# Patient Record
Sex: Male | Born: 1950 | Race: White | Hispanic: No | Marital: Married | Smoking: Former smoker
Health system: Southern US, Community
[De-identification: ages and names within clinical notes are randomized; demographics above are authoritative.]

## PROBLEM LIST (undated history)

## (undated) DIAGNOSIS — M519 Unspecified thoracic, thoracolumbar and lumbosacral intervertebral disc disorder: Secondary | ICD-10-CM

## (undated) DIAGNOSIS — J449 Chronic obstructive pulmonary disease, unspecified: Secondary | ICD-10-CM

## (undated) DIAGNOSIS — T8859XA Other complications of anesthesia, initial encounter: Secondary | ICD-10-CM

## (undated) DIAGNOSIS — IMO0001 Reserved for inherently not codable concepts without codable children: Secondary | ICD-10-CM

## (undated) DIAGNOSIS — I2510 Atherosclerotic heart disease of native coronary artery without angina pectoris: Secondary | ICD-10-CM

## (undated) DIAGNOSIS — I10 Essential (primary) hypertension: Secondary | ICD-10-CM

## (undated) DIAGNOSIS — Z87442 Personal history of urinary calculi: Secondary | ICD-10-CM

## (undated) DIAGNOSIS — N200 Calculus of kidney: Secondary | ICD-10-CM

## (undated) DIAGNOSIS — E7800 Pure hypercholesterolemia, unspecified: Secondary | ICD-10-CM

## (undated) DIAGNOSIS — I82409 Acute embolism and thrombosis of unspecified deep veins of unspecified lower extremity: Secondary | ICD-10-CM

## (undated) DIAGNOSIS — R51 Headache: Secondary | ICD-10-CM

## (undated) DIAGNOSIS — G2581 Restless legs syndrome: Secondary | ICD-10-CM

## (undated) DIAGNOSIS — G4730 Sleep apnea, unspecified: Secondary | ICD-10-CM

## (undated) DIAGNOSIS — I499 Cardiac arrhythmia, unspecified: Secondary | ICD-10-CM

## (undated) DIAGNOSIS — K219 Gastro-esophageal reflux disease without esophagitis: Secondary | ICD-10-CM

## (undated) DIAGNOSIS — R519 Headache, unspecified: Secondary | ICD-10-CM

## (undated) DIAGNOSIS — H9319 Tinnitus, unspecified ear: Secondary | ICD-10-CM

## (undated) DIAGNOSIS — M1990 Unspecified osteoarthritis, unspecified site: Secondary | ICD-10-CM

## (undated) DIAGNOSIS — M5430 Sciatica, unspecified side: Secondary | ICD-10-CM

## (undated) DIAGNOSIS — H9190 Unspecified hearing loss, unspecified ear: Secondary | ICD-10-CM

## (undated) DIAGNOSIS — E119 Type 2 diabetes mellitus without complications: Secondary | ICD-10-CM

## (undated) DIAGNOSIS — M205X9 Other deformities of toe(s) (acquired), unspecified foot: Secondary | ICD-10-CM

## (undated) DIAGNOSIS — T4145XA Adverse effect of unspecified anesthetic, initial encounter: Secondary | ICD-10-CM

## (undated) DIAGNOSIS — J8410 Pulmonary fibrosis, unspecified: Secondary | ICD-10-CM

## (undated) DIAGNOSIS — Z8719 Personal history of other diseases of the digestive system: Secondary | ICD-10-CM

## (undated) DIAGNOSIS — I509 Heart failure, unspecified: Secondary | ICD-10-CM

## (undated) DIAGNOSIS — C801 Malignant (primary) neoplasm, unspecified: Secondary | ICD-10-CM

## (undated) HISTORY — PX: BACK SURGERY: SHX140

## (undated) HISTORY — PX: ABDOMINAL SURGERY: SHX537

## (undated) HISTORY — PX: BLADDER REPAIR: SHX76

## (undated) HISTORY — PX: EYE SURGERY: SHX253

## (undated) HISTORY — PX: HERNIA REPAIR: SHX51

---

## 2014-01-08 DIAGNOSIS — G2581 Restless legs syndrome: Secondary | ICD-10-CM

## 2014-01-08 DIAGNOSIS — N200 Calculus of kidney: Secondary | ICD-10-CM

## 2014-01-08 HISTORY — DX: Calculus of kidney: N20.0

## 2014-01-08 HISTORY — DX: Restless legs syndrome: G25.81

## 2015-01-08 ENCOUNTER — Emergency Department (HOSPITAL_COMMUNITY): Payer: Medicare Other

## 2015-01-08 ENCOUNTER — Observation Stay (HOSPITAL_COMMUNITY)
Admission: EM | Admit: 2015-01-08 | Discharge: 2015-01-10 | Disposition: A | Discharge status: Home / Self Care | Payer: Medicare Other | Attending: Internal Medicine | Admitting: Internal Medicine

## 2015-01-08 ENCOUNTER — Encounter (HOSPITAL_COMMUNITY): Payer: Self-pay

## 2015-01-08 DIAGNOSIS — Z794 Long term (current) use of insulin: Secondary | ICD-10-CM | POA: Insufficient documentation

## 2015-01-08 DIAGNOSIS — E785 Hyperlipidemia, unspecified: Secondary | ICD-10-CM | POA: Insufficient documentation

## 2015-01-08 DIAGNOSIS — R079 Chest pain, unspecified: Secondary | ICD-10-CM | POA: Diagnosis not present

## 2015-01-08 DIAGNOSIS — K219 Gastro-esophageal reflux disease without esophagitis: Secondary | ICD-10-CM | POA: Diagnosis not present

## 2015-01-08 DIAGNOSIS — J449 Chronic obstructive pulmonary disease, unspecified: Secondary | ICD-10-CM | POA: Diagnosis present

## 2015-01-08 DIAGNOSIS — E1169 Type 2 diabetes mellitus with other specified complication: Secondary | ICD-10-CM | POA: Diagnosis present

## 2015-01-08 DIAGNOSIS — IMO0001 Reserved for inherently not codable concepts without codable children: Secondary | ICD-10-CM

## 2015-01-08 DIAGNOSIS — Z8551 Personal history of malignant neoplasm of bladder: Secondary | ICD-10-CM | POA: Insufficient documentation

## 2015-01-08 DIAGNOSIS — I5022 Chronic systolic (congestive) heart failure: Secondary | ICD-10-CM | POA: Diagnosis not present

## 2015-01-08 DIAGNOSIS — R072 Precordial pain: Secondary | ICD-10-CM | POA: Diagnosis not present

## 2015-01-08 DIAGNOSIS — E119 Type 2 diabetes mellitus without complications: Secondary | ICD-10-CM | POA: Insufficient documentation

## 2015-01-08 DIAGNOSIS — Z86718 Personal history of other venous thrombosis and embolism: Secondary | ICD-10-CM | POA: Insufficient documentation

## 2015-01-08 DIAGNOSIS — I2510 Atherosclerotic heart disease of native coronary artery without angina pectoris: Secondary | ICD-10-CM | POA: Diagnosis not present

## 2015-01-08 DIAGNOSIS — Z9981 Dependence on supplemental oxygen: Secondary | ICD-10-CM | POA: Insufficient documentation

## 2015-01-08 DIAGNOSIS — Z87891 Personal history of nicotine dependence: Secondary | ICD-10-CM | POA: Diagnosis not present

## 2015-01-08 DIAGNOSIS — I10 Essential (primary) hypertension: Secondary | ICD-10-CM | POA: Insufficient documentation

## 2015-01-08 HISTORY — DX: Calculus of kidney: N20.0

## 2015-01-08 HISTORY — DX: Chronic obstructive pulmonary disease, unspecified: J44.9

## 2015-01-08 HISTORY — DX: Type 2 diabetes mellitus without complications: E11.9

## 2015-01-08 HISTORY — DX: Malignant (primary) neoplasm, unspecified: C80.1

## 2015-01-08 HISTORY — DX: Essential (primary) hypertension: I10

## 2015-01-08 HISTORY — DX: Unspecified osteoarthritis, unspecified site: M19.90

## 2015-01-08 LAB — GLUCOSE, CAPILLARY: GLUCOSE-CAPILLARY: 83 mg/dL (ref 70–99)

## 2015-01-08 LAB — BASIC METABOLIC PANEL
Anion gap: 12 (ref 5–15)
BUN: 20 mg/dL (ref 6–23)
CHLORIDE: 104 mmol/L (ref 96–112)
CO2: 25 mmol/L (ref 19–32)
CREATININE: 0.86 mg/dL (ref 0.50–1.35)
Calcium: 9.1 mg/dL (ref 8.4–10.5)
Glucose, Bld: 141 mg/dL — ABNORMAL HIGH (ref 70–99)
Potassium: 4.1 mmol/L (ref 3.5–5.1)
Sodium: 141 mmol/L (ref 135–145)

## 2015-01-08 LAB — BRAIN NATRIURETIC PEPTIDE: B Natriuretic Peptide: 13.9 pg/mL (ref 0.0–100.0)

## 2015-01-08 LAB — CBC
HCT: 41.8 % (ref 39.0–52.0)
Hemoglobin: 13 g/dL (ref 13.0–17.0)
MCH: 26.3 pg (ref 26.0–34.0)
MCHC: 31.1 g/dL (ref 30.0–36.0)
MCV: 84.4 fL (ref 78.0–100.0)
PLATELETS: 209 10*3/uL (ref 150–400)
RBC: 4.95 MIL/uL (ref 4.22–5.81)
RDW: 17.5 % — ABNORMAL HIGH (ref 11.5–15.5)
WBC: 5 10*3/uL (ref 4.0–10.5)

## 2015-01-08 LAB — TROPONIN I
Troponin I: <0.03 ng/mL (ref <0.031)
Troponin I: <0.03 ng/mL (ref <0.031)

## 2015-01-08 MED ORDER — INSULIN ASPART 100 UNIT/ML ~~LOC~~ SOLN
0.0000 [IU] | Freq: Three times a day (TID) | SUBCUTANEOUS | Status: DC
Start: 2015-01-09 — End: 2015-01-10
  Administered 2015-01-09 – 2015-01-10 (×3): 3 [IU] via SUBCUTANEOUS

## 2015-01-08 MED ORDER — CARVEDILOL 3.125 MG PO TABS
3.1250 mg | ORAL_TABLET | Freq: Two times a day (BID) | ORAL | Status: DC
  Administered 2015-01-10: 3.125 mg via ORAL
  Filled 2015-01-08: qty 1

## 2015-01-08 MED ORDER — ACETAMINOPHEN 325 MG PO TABS: 650.0000 mg | ORAL_TABLET | ORAL | Status: DC | PRN

## 2015-01-08 MED ORDER — IOHEXOL 350 MG/ML SOLN
100.0000 mL | Freq: Once | INTRAVENOUS | Status: AC | PRN
  Administered 2015-01-08: 100 mL via INTRAVENOUS

## 2015-01-08 MED ORDER — GI COCKTAIL ~~LOC~~: 30.0000 mL | Freq: Four times a day (QID) | ORAL | Status: DC | PRN

## 2015-01-08 MED ORDER — ROPINIROLE HCL 1 MG PO TABS
0.5000 mg | ORAL_TABLET | Freq: Every day | ORAL | Status: DC
  Administered 2015-01-08 – 2015-01-09 (×2): 0.5 mg via ORAL
  Filled 2015-01-08 (×2): qty 0.5

## 2015-01-08 MED ORDER — INSULIN ASPART 100 UNIT/ML ~~LOC~~ SOLN
30.0000 [IU] | Freq: Three times a day (TID) | SUBCUTANEOUS | Status: DC
  Administered 2015-01-09 – 2015-01-10 (×3): 30 [IU] via SUBCUTANEOUS

## 2015-01-08 MED ORDER — ASPIRIN 81 MG PO CHEW
324.0000 mg | CHEWABLE_TABLET | Freq: Once | ORAL | Status: AC
  Administered 2015-01-08: 324 mg via ORAL
  Filled 2015-01-08: qty 4

## 2015-01-08 MED ORDER — NITROGLYCERIN 0.4 MG SL SUBL: 0.4000 mg | SUBLINGUAL_TABLET | SUBLINGUAL | Status: DC | PRN

## 2015-01-08 MED ORDER — LISINOPRIL 20 MG PO TABS
20.0000 mg | ORAL_TABLET | Freq: Every day | ORAL | Status: DC
  Administered 2015-01-09 – 2015-01-10 (×2): 20 mg via ORAL
  Filled 2015-01-08 (×2): qty 1

## 2015-01-08 MED ORDER — ASPIRIN EC 325 MG PO TBEC
325.0000 mg | DELAYED_RELEASE_TABLET | Freq: Every day | ORAL | Status: DC
  Administered 2015-01-09: 325 mg via ORAL
  Filled 2015-01-08: qty 1

## 2015-01-08 MED ORDER — ONDANSETRON HCL 4 MG/2ML IJ SOLN: 4.0000 mg | Freq: Four times a day (QID) | INTRAMUSCULAR | Status: DC | PRN

## 2015-01-08 MED ORDER — SIMVASTATIN 10 MG PO TABS
10.0000 mg | ORAL_TABLET | Freq: Every day | ORAL | Status: DC
  Administered 2015-01-08: 10 mg via ORAL
  Filled 2015-01-08: qty 1

## 2015-01-08 MED ORDER — INSULIN GLARGINE 100 UNIT/ML ~~LOC~~ SOLN
30.0000 [IU] | Freq: Two times a day (BID) | SUBCUTANEOUS | Status: DC
Start: 2015-01-08 — End: 2015-01-10
  Administered 2015-01-08 – 2015-01-10 (×4): 30 [IU] via SUBCUTANEOUS
  Filled 2015-01-08 (×5): qty 0.3

## 2015-01-08 MED ORDER — INSULIN ASPART 100 UNIT/ML ~~LOC~~ SOLN
0.0000 [IU] | Freq: Every day | SUBCUTANEOUS | Status: DC
  Administered 2015-01-08: 5 [IU] via SUBCUTANEOUS

## 2015-01-08 MED ORDER — MORPHINE SULFATE 2 MG/ML IJ SOLN: 2.0000 mg | INTRAMUSCULAR | Status: DC | PRN

## 2015-01-08 MED ORDER — FUROSEMIDE 20 MG PO TABS
20.0000 mg | ORAL_TABLET | Freq: Two times a day (BID) | ORAL | Status: DC
  Administered 2015-01-09 – 2015-01-10 (×3): 20 mg via ORAL
  Filled 2015-01-08 (×3): qty 1

## 2015-01-08 MED ORDER — ENOXAPARIN SODIUM 60 MG/0.6ML ~~LOC~~ SOLN
60.0000 mg | SUBCUTANEOUS | Status: DC
Start: 2015-01-08 — End: 2015-01-09
  Administered 2015-01-08: 60 mg via SUBCUTANEOUS
  Filled 2015-01-08: qty 0.6

## 2015-01-08 NOTE — Consult Note (Signed)
Reason for Consult: Chest pain Referring Physician: [facility] Ricky Johnston is an 64 y.o. male.  HPI: patient is 64 year old male with past medical history significant for hypertension, diabetes mellitus, COPD, morbid obesity Remote history of tobacco abuse 30 pack years quit in 2001, history of DVT, bladder cancer, was admitted earlier today complaining of retrosternal chest pain radiating to neck and left arm associated with left facial flush feeling associated with mild shortness of breath states chest pain was grade 10 over 10 lasted approximately 15 to 20 minutes took 3 aspirin with relief of chest pain. Patient also gives history of exertional dyspnea with minimal exertion which he attributes to his COPD. States had the stress test approximately one year ago at [place] in [place] which was negative records not available. EKG done in the ED showed no acute ischemic changes his first set of cardiac enzymes is negative. Patient presently denies any chest pain. Patient denies relation of chest pain to food breathing or coughing. Patient denies any weakness in the arms or legs. Denies any slurred speech.  Past Medical History  Diagnosis Date  . COPD (chronic obstructive pulmonary disease)   . Diabetes mellitus without complication   . Hypertension   . Arthritis   . Kidney stones   . Cancer     bladder cancer currently 2016    Past Surgical History  Procedure Laterality Date  . Bladder repair    . Abdominal surgery    . Back surgery    . Eye surgery    . Hernia repair      History reviewed. No pertinent family history.  Social History:  reports that he has quit smoking. He does not have any smokeless tobacco history on file. He reports that he drinks alcohol. He reports that he uses illicit drugs.  Allergies:  Allergies  Allergen Reactions  . Poison Ivy Extract [Extract Of Poison Ivy] Rash    Medications: I have reviewed the patient's current medications.  Results for orders  placed or performed during the hospital encounter of 01/08/15 (from the past 48 hour(s))  CBC     Status: Abnormal   Collection Time: 01/08/15  1:31 PM  Result Value Ref Range   WBC 5.0 4.0 - 10.5 K/uL   RBC 4.95 4.22 - 5.81 MIL/uL   Hemoglobin 13.0 13.0 - 17.0 g/dL   HCT 41.8 39.0 - 52.0 %   MCV 84.4 78.0 - 100.0 fL   MCH 26.3 26.0 - 34.0 pg   MCHC 31.1 30.0 - 36.0 g/dL   RDW 17.5 (H) 11.5 - 15.5 %   Platelets 209 150 - 400 K/uL  Basic metabolic panel     Status: Abnormal   Collection Time: 01/08/15  1:31 PM  Result Value Ref Range   Sodium 141 135 - 145 mmol/L   Potassium 4.1 3.5 - 5.1 mmol/L   Chloride 104 96 - 112 mmol/L   CO2 25 19 - 32 mmol/L   Glucose, Bld 141 (H) 70 - 99 mg/dL   BUN 20 6 - 23 mg/dL   Creatinine, Ser 0.86 0.50 - 1.35 mg/dL   Calcium 9.1 8.4 - 10.5 mg/dL   GFR calc non Af Amer >90 >90 mL/min   GFR calc Af Amer >90 >90 mL/min    Comment: (NOTE) The eGFR has been calculated using the CKD EPI equation. This calculation has not been validated in all clinical situations. eGFR's persistently <90 mL/min signify possible Chronic Kidney Disease.    Anion gap 12  5 - 15  BNP (order ONLY if patient complains of dyspnea/SOB AND you have documented it for THIS visit)     Status: None   Collection Time: 01/08/15  1:31 PM  Result Value Ref Range   B Natriuretic Peptide 13.9 0.0 - 100.0 pg/mL  Troponin I     Status: None   Collection Time: 01/08/15  2:10 PM  Result Value Ref Range   Troponin I <0.03 <0.031 ng/mL    Comment:        NO INDICATION OF MYOCARDIAL INJURY.   Glucose, capillary     Status: None   Collection Time: 01/08/15  6:20 PM  Result Value Ref Range   Glucose-Capillary 83 70 - 99 mg/dL  Troponin I-serum (0, 3, 6 hours)     Status: None   Collection Time: 01/08/15  7:08 PM  Result Value Ref Range   Troponin I <0.03 <0.031 ng/mL    Comment:        NO INDICATION OF MYOCARDIAL INJURY.     Dg Chest 2 View  01/08/2015   CLINICAL DATA:  Acute  left-sided chest pain.  EXAM: CHEST  2 VIEW  COMPARISON:  None.  FINDINGS: The heart size and mediastinal contours are within normal limits. No pneumothorax or pleural effusion is noted. Mild central pulmonary vascular congestion is noted. No consolidative process is noted. The visualized skeletal structures are unremarkable.  IMPRESSION: Mild central pulmonary vascular congestion is noted. Mild bilateral perihilar pulmonary edema cannot be excluded.   Electronically Signed   By: Marijo Conception, M.D.   On: 01/08/2015 14:20   Ct Angio Chest Pe W/cm &/or Wo Cm  01/08/2015   CLINICAL DATA:  Acute left-sided chest pain with shortness of breath.  EXAM: CT ANGIOGRAPHY CHEST WITH CONTRAST  TECHNIQUE: Multidetector CT imaging of the chest was performed using the standard protocol during bolus administration of intravenous contrast. Multiplanar CT image reconstructions and MIPs were obtained to evaluate the vascular anatomy.  CONTRAST:  132m OMNIPAQUE IOHEXOL 350 MG/ML SOLN  COMPARISON:  Chest radiograph of same day.  FINDINGS: No pneumothorax or pleural effusion is noted. No acute pulmonary disease is noted. There is no evidence of pulmonary embolus. There is no evidence of thoracic aortic dissection or aneurysm. Visualized portion of upper abdomen appears normal. No mediastinal mass or adenopathy is noted. No significant osseous abnormality is noted.  Review of the MIP images confirms the above findings.  IMPRESSION: No evidence of pulmonary embolus. No significant abnormality seen in the chest.   Electronically Signed   By: JMarijo Conception M.D.   On: 01/08/2015 16:16    Review of Systems  Constitutional: Negative for fever and chills.  HENT: Negative for hearing loss.   Eyes: Negative for blurred vision, double vision and photophobia.  Respiratory: Positive for shortness of breath. Negative for cough, hemoptysis and sputum production.   Cardiovascular: Positive for chest pain. Negative for palpitations,  orthopnea and claudication.  Gastrointestinal: Negative for nausea, vomiting and abdominal pain.  Genitourinary: Negative for dysuria.  Neurological: Negative for dizziness and headaches.   Blood pressure 152/77, pulse 94, temperature 97.4 F (36.3 C), temperature source Oral, resp. rate 20, height '5\' 7"'  (1.702 m), weight 125.737 kg (277 lb 3.2 oz), SpO2 95 %. Physical Exam  Constitutional: He is oriented to person, place, and time.  HENT:  Head: Normocephalic and atraumatic.  Eyes: Conjunctivae and EOM are normal. Left eye exhibits no discharge. No scleral icterus.  Neck: Normal range  of motion. Neck supple. No JVD present. No tracheal deviation present. No thyromegaly present.  Cardiovascular: Regular rhythm.  Exam reveals no friction rub.   No murmur heard. Respiratory: Effort normal and breath sounds normal. No respiratory distress. He has no wheezes. He has no rales.  GI: Soft. Bowel sounds are normal. He exhibits distension. There is no tenderness. There is no rebound.  Musculoskeletal:  No clubbing cyanosis trace edema noted  Neurological: He is alert and oriented to person, place, and time.    Assessment/Plan: Chest pain/left arm pain worrisome for angina rule out MI Hypertension Diabetes mellitus COPD Morbid obesity Remote tobacco abuse History of DVT in the past Bladder carcinoma Plan Agree with present management Will schedule him for a nuclear stress test in a.m. in view of worrisome anginal chest pain and multiple risk factors.  Koy Lamp N 01/08/2015, 9:30 PM

## 2015-01-08 NOTE — H&P (Signed)
[facility] History and Physical                                                                                    Ricky Johnston, is a 64 y.o. male  MRN: [id number]   DOB - 09-27-1951  Admit Date - 01/08/2015  Outpatient Primary MD for the patient is No PCP Per Patient Primary care is [facility], clinic F1  With History of -  Past Medical History  Diagnosis Date  . COPD (chronic obstructive pulmonary disease)   . Diabetes mellitus without complication   . Hypertension   . Arthritis   . Kidney stones   . Cancer     bladder cancer currently 2016      Past Surgical History  Procedure Laterality Date  . Bladder repair    . Abdominal surgery    . Back surgery    . Eye surgery    . Hernia repair      in for   Chief Complaint  Patient presents with  . Chest Pain     HPI  Ricky Johnston  is a 64 y.o. male, with a past medical history of upper extremity DVT, active bladder cancer (scheduled for resection), COPD, and insulin-dependent diabetes mellitus. He reports sitting in his car at rest when he developed sudden sharp left-sided chest pain that radiated up into his neck and down his left arm. His face felt hot. This lasted about 20 minutes and eased off by itself. The patient reports no recent history of such symptoms. He denies orthopnea and PND. He reports no change in his exercise tolerance. He normally walks slowly and becomes short of breath on exertion due to his COPD. He uses oxygen PRN at home. He was hospitalized at the [facility] in December with a COPD exacerbation at that time he had a 2-D echo which showed diastolic dysfunction. In 2004 he had a cardiac catheterization in [place], and reports he had clean coronary arteries.   In the ER, his troponin is normal, his EKG does not show any ST elevation or depression, and his chest pain has not returned. Given his history of active bladder cancer and DVT, the EDP checked a CT a chest that was  negative for PE.  Review of Systems   In addition to the HPI above,  No Fever-chills, No Headache, No changes with Vision or hearing, No problems swallowing food or Liquids, No Abdominal pain, No Nausea or Vomiting, Bowel movements are regular, No Blood in stool or Urine, No dysuria, No new joints pains-aches   A full 10 point Review of Systems was done, except as stated above, all other Review of Systems were negative.  Social History History  Substance Use Topics  . Smoking status: Former Research scientist (life sciences)  . Smokeless tobacco: Not on file  . Alcohol Use: Yes  He rarely drinks alcohol and denies ever using recreational drugs.   Family History History reviewed. No pertinent family history.  His father is deceased but the patient has no information about how he passed. His mother is living and recently had an MI.   Prior to Admission medications   Medication  Sig Start Date End Date Taking? Authorizing Provider  furosemide (LASIX) 20 MG tablet Take 20 mg by mouth 2 (two) times daily.   Yes Historical Provider, MD  insulin aspart (NOVOLOG) 100 UNIT/ML injection Inject 35-60 Units into the skin 3 (three) times daily before meals. 35 units at breakfast. 35 units at lunch 60 units at bedtime   Yes Historical Provider, MD  insulin glargine (LANTUS) 100 UNIT/ML injection Inject 45 Units into the skin 2 (two) times daily.   Yes Historical Provider, MD  lisinopril (PRINIVIL,ZESTRIL) 20 MG tablet Take 20 mg by mouth daily.   Yes Historical Provider, MD  metFORMIN (GLUCOPHAGE) 1000 MG tablet Take 1,000 mg by mouth 2 (two) times daily with a meal.   Yes Historical Provider, MD  SIMVASTATIN PO Take 1 tablet by mouth daily.   Yes Historical Provider, MD    Allergies  Allergen Reactions  . Poison Ivy Extract [Extract Of Poison Ivy] Rash    Physical Exam  Vitals  Blood pressure 136/76, pulse 66, temperature 97.9 F (36.6 C), temperature source Oral, resp. rate 20, height 5\' 7"  (1.702 m), weight  125.737 kg (277 lb 3.2 oz), SpO2 95 %.   General:  well-developed, obese, pleasant male  lying in bed in NAD, Wife and sister-in-law at bedside   Psych:  Normal affect and insight, Not Suicidal or Homicidal, Awake Alert, Oriented X 3.  Neuro:   No F.N deficits, ALL C.Nerves Intact, Strength 5/5 all 4 extremities, Sensation intact all 4 extremities.  ENT:  Ears and Eyes appear Normal, Conjunctivae clear, PER. Moist oral mucosa without erythema or exudates.  Neck:  Supple, No lymphadenopathy appreciated  Respiratory:  Symmetrical chest wall movement, Good air movement bilaterally, CTAB.  Cardiac:  RRR, No Murmurs, 1+ LEE noted bilaterally, no JVD.  Chest pain is not reproducible on palpation or with inspiration.   Abdomen:  Positive bowel sounds, Soft, Non tender, Non distended,  No masses appreciated  Skin:  No Cyanosis, Normal Skin Turgor, No Skin Rash or Bruise.  Extremities:  Able to move all 4. 5/5 strength in each,  no effusions.  Data Review  CBC  Recent Labs Lab 01/08/15 1331  WBC 5.0  HGB 13.0  HCT 41.8  PLT 209  MCV 84.4  MCH 26.3  MCHC 31.1  RDW 17.5*    Chemistries   Recent Labs Lab 01/08/15 1331  NA 141  K 4.1  CL 104  CO2 25  GLUCOSE 141*  BUN 20  CREATININE 0.86  CALCIUM 9.1     Cardiac Enzymes  Recent Labs Lab 01/08/15 1410  TROPONINI <0.03     Imaging results:   Dg Chest 2 View  01/08/2015   CLINICAL DATA:  Acute left-sided chest pain.  EXAM: CHEST  2 VIEW  COMPARISON:  None.  FINDINGS: The heart size and mediastinal contours are within normal limits. No pneumothorax or pleural effusion is noted. Mild central pulmonary vascular congestion is noted. No consolidative process is noted. The visualized skeletal structures are unremarkable.  IMPRESSION: Mild central pulmonary vascular congestion is noted. Mild bilateral perihilar pulmonary edema cannot be excluded.   Electronically Signed   By: Marijo Conception, M.D.   On: 01/08/2015 14:20    Ct Angio Chest Pe W/cm &/or Wo Cm  01/08/2015   CLINICAL DATA:  Acute left-sided chest pain with shortness of breath.  EXAM: CT ANGIOGRAPHY CHEST WITH CONTRAST  TECHNIQUE: Multidetector CT imaging of the chest was performed using the standard protocol during  bolus administration of intravenous contrast. Multiplanar CT image reconstructions and MIPs were obtained to evaluate the vascular anatomy.  CONTRAST:  13mL OMNIPAQUE IOHEXOL 350 MG/ML SOLN  COMPARISON:  Chest radiograph of same day.  FINDINGS: No pneumothorax or pleural effusion is noted. No acute pulmonary disease is noted. There is no evidence of pulmonary embolus. There is no evidence of thoracic aortic dissection or aneurysm. Visualized portion of upper abdomen appears normal. No mediastinal mass or adenopathy is noted. No significant osseous abnormality is noted.  Review of the MIP images confirms the above findings.  IMPRESSION: No evidence of pulmonary embolus. No significant abnormality seen in the chest.   Electronically Signed   By: Marijo Conception, M.D.   On: 01/08/2015 16:16    My personal review of EKG: NSR, No ST changes noted.   Assessment & Plan  Principal Problem:   Chest pain Active Problems:   Insulin dependent diabetes mellitus   COPD (chronic obstructive pulmonary disease)   Hyperlipidemia associated with type 2 diabetes mellitus  Chest pain at rest  Lasted 20 minutes, is not reproducible. Does not sound like GERD or musculoskeletal pain. Troponin is normal, EKG is reassuring. Despite all of this, I asked cardiology to see the patient as he has multiple significant risk factors. Admitted for observation under the chest pain order set. Coreg 3.125 mg twice a day initiated. Dr.Harwani was consulted.  The patient does not have a local cardiologist.  Diastolic dysfunction The patient had a 2-D echo at [facility] in December. Consequently, I will not repeat this now.  He is on Lasix daily for his lower extremity  edema.  Insulin-dependent diabetes  He is on very large doses of insulin at home. Will reduce these doses but adjust as necessary in-house. Check hemoglobin A1c.  COPD  Currently stable. Patient is on when necessary oxygen at home.  Hyperlipidemia Will continue statin. Need pharmacy to verify statin home dose.    Hypertension Continue Lasix, lisinopril, add Coreg 3.125 mg twice a day withhold parameters. Will monitor his pulse in-house overnight to ensure his rate can tolerate a beta blocker.   DVT Prophylaxis: Lovenox  AM Labs Ordered, also please review Full Orders  Family Communication:   Wife and sister-in-law at bedside  Code Status:  Full code  Condition:  Guarded  Time spent in minutes : [place],  PA-C on 01/08/2015 at 6:24 PM  Between 7am to 7pm - Pager - [phone]  After 7pm go to [email or web address] - password [email or web address]  And look for the night coverage person covering me after hours  [facility]

## 2015-01-08 NOTE — ED Notes (Signed)
Pt presents with L sided chest pain that began x 1 hour ago while getting tires changed.  +shortness of breath; denies any nausea or diaphoresis; pt reports pain radiates into L shoulder, arm and scapula.

## 2015-01-08 NOTE — ED Notes (Addendum)
Attempted report to Coralie Common RN.

## 2015-01-08 NOTE — ED Provider Notes (Signed)
CSN: [id number]     Arrival date & time 01/08/15  1312 History   First MD Initiated Contact with Patient 01/08/15 1420     Chief Complaint  Patient presents with  . Chest Pain     (Consider location/radiation/quality/duration/timing/severity/associated sxs/prior Treatment) Patient is a 64 y.o. male presenting with chest pain. The history is provided by the patient and medical records.  Chest Pain Associated symptoms: diaphoresis and shortness of breath     This is a 64 year old male with past medical history significant for hypertension, diabetes, COPD, bladder cancer scheduled for excision and late March, presenting to the ED for chest pain began approximately 1 hour prior to arrival. Patient states he was getting therapy into his tires when he had sudden onset of left-sided chest pain with radiation into his left shoulder and down his left arm with associated shortness of breath and diaphoresis. He states initially pain was 10/10 but by time of arrival in ED had decreased down to 4/10.  Patient states he has had a cardiac cath in the past at the [facility] hospital several years ago which he states was negative.  He does have hx of DVT in his RUE approx 10 years ago, states he completed course of coumadin and has not had any recurrent problems since.  Patient was a former smoker, quit in 2001.  VSS on arrival.  Past Medical History  Diagnosis Date  . COPD (chronic obstructive pulmonary disease)   . Diabetes mellitus without complication   . Hypertension   . Arthritis   . Cancer   . Kidney stones    Past Surgical History  Procedure Laterality Date  . Bladder repair    . Abdominal surgery    . Back surgery    . Eye surgery    . Hernia repair     History reviewed. No pertinent family history. History  Substance Use Topics  . Smoking status: Former Research scientist (life sciences)  . Smokeless tobacco: Not on file  . Alcohol Use: Yes    Review of Systems  Constitutional: Positive for diaphoresis.   Respiratory: Positive for shortness of breath.   Cardiovascular: Positive for chest pain.  All other systems reviewed and are negative.     Allergies  Poison ivy extract  Home Medications   Prior to Admission medications   Not on File   BP 116/71 mmHg  Pulse 66  Temp(Src) 98.3 F (36.8 C) (Oral)  Resp 15  Ht 5\' 8"  (1.727 m)  Wt 278 lb 3.2 oz (126.191 kg)  BMI 42.31 kg/m2  SpO2 94%   Physical Exam  Constitutional: He is oriented to person, place, and time. He appears well-developed and well-nourished. No distress.  HENT:  Head: Normocephalic and atraumatic.  Mouth/Throat: Oropharynx is clear and moist.  Eyes: Conjunctivae and EOM are normal. Pupils are equal, round, and reactive to light.  Neck: Normal range of motion. Neck supple.  Cardiovascular: Normal rate, regular rhythm and normal heart sounds.   Pulmonary/Chest: Effort normal and breath sounds normal. No respiratory distress. He has no wheezes.  Abdominal: Soft. Bowel sounds are normal. There is no tenderness. There is no guarding.  Musculoskeletal: Normal range of motion.  Trace edema bilateral ankles No calf asymmetry, tenderness, or palpable cords; negative Homan's sign bilaterally; DP pulses intact bilaterally  Neurological: He is alert and oriented to person, place, and time.  Skin: Skin is warm and dry. He is not diaphoretic.  Psychiatric: He has a normal mood and affect.  Nursing note  and vitals reviewed.   ED Course  Procedures (including critical care time) Labs Review Labs Reviewed  CBC - Abnormal; Notable for the following:    RDW 17.5 (*)    All other components within normal limits  BASIC METABOLIC PANEL - Abnormal; Notable for the following:    Glucose, Bld 141 (*)    All other components within normal limits  BRAIN NATRIURETIC PEPTIDE  TROPONIN I    Imaging Review Dg Chest 2 View  01/08/2015   CLINICAL DATA:  Acute left-sided chest pain.  EXAM: CHEST  2 VIEW  COMPARISON:  None.   FINDINGS: The heart size and mediastinal contours are within normal limits. No pneumothorax or pleural effusion is noted. Mild central pulmonary vascular congestion is noted. No consolidative process is noted. The visualized skeletal structures are unremarkable.  IMPRESSION: Mild central pulmonary vascular congestion is noted. Mild bilateral perihilar pulmonary edema cannot be excluded.   Electronically Signed   By: Marijo Conception, M.D.   On: 01/08/2015 14:20     EKG Interpretation None      MDM   Final diagnoses:  Chest pain, unspecified chest pain type   64 year old male with sudden onset of left-sided chest pain with radiation into shoulder while getting therapy into his tires. He has no known cardiac history. He was a former smoker.  Has had prior cath in the past which he states was negative. EKG sinus rhythm without acute ischemia.  Lab work reassuring, troponin negative. Chest x-ray with mild vascular congestion, possible edema.  Patient does have history of DVT in his right upper extremity as well as active bladder cancer, therefore CT angio of chest was obtained which is negative for acute PE.  Patient given ASA and NTG with resolution of pain, VS remain stable.  Workup today is negative, however patient's story is very concerning. HEART score of 5.  Feel he would benefit from overnight observation and serial enzymes. This was discussed with patient and family who are in agreement with this.  Case discussed with hospitalist who will admit for further management.  Temp admission orders placed.  VS remain stable.  Larene Pickett, PA-C 01/08/15 1718  Ernestina Patches, MD 01/08/15 1958

## 2015-01-09 ENCOUNTER — Encounter (HOSPITAL_COMMUNITY): Payer: Medicare Other

## 2015-01-09 ENCOUNTER — Encounter (HOSPITAL_COMMUNITY)
Admission: EM | Disposition: A | Discharge status: Home / Self Care | Payer: Self-pay | Source: Home / Self Care | Attending: Emergency Medicine

## 2015-01-09 ENCOUNTER — Observation Stay (HOSPITAL_COMMUNITY): Payer: Medicare Other

## 2015-01-09 DIAGNOSIS — K219 Gastro-esophageal reflux disease without esophagitis: Secondary | ICD-10-CM | POA: Insufficient documentation

## 2015-01-09 DIAGNOSIS — E785 Hyperlipidemia, unspecified: Secondary | ICD-10-CM

## 2015-01-09 DIAGNOSIS — Z794 Long term (current) use of insulin: Secondary | ICD-10-CM

## 2015-01-09 DIAGNOSIS — E1169 Type 2 diabetes mellitus with other specified complication: Secondary | ICD-10-CM

## 2015-01-09 DIAGNOSIS — E119 Type 2 diabetes mellitus without complications: Secondary | ICD-10-CM

## 2015-01-09 DIAGNOSIS — J438 Other emphysema: Secondary | ICD-10-CM

## 2015-01-09 DIAGNOSIS — R079 Chest pain, unspecified: Secondary | ICD-10-CM | POA: Insufficient documentation

## 2015-01-09 DIAGNOSIS — I5022 Chronic systolic (congestive) heart failure: Secondary | ICD-10-CM | POA: Insufficient documentation

## 2015-01-09 HISTORY — PX: LEFT HEART CATHETERIZATION WITH CORONARY ANGIOGRAM: SHX5451

## 2015-01-09 LAB — GLUCOSE, CAPILLARY
GLUCOSE-CAPILLARY: 105 mg/dL — AB (ref 70–99)
Glucose-Capillary: 254 mg/dL — ABNORMAL HIGH (ref 70–99)
Glucose-Capillary: 184 mg/dL — ABNORMAL HIGH (ref 70–99)
Glucose-Capillary: 183 mg/dL — ABNORMAL HIGH (ref 70–99)
Glucose-Capillary: 185 mg/dL — ABNORMAL HIGH (ref 70–99)
Glucose-Capillary: 126 mg/dL — ABNORMAL HIGH (ref 70–99)

## 2015-01-09 LAB — TROPONIN I: Troponin I: <0.03 ng/mL (ref <0.031)

## 2015-01-09 SURGERY — LEFT HEART CATHETERIZATION WITH CORONARY ANGIOGRAM
Anesthesia: LOCAL

## 2015-01-09 MED ORDER — HEPARIN (PORCINE) IN NACL 2-0.9 UNIT/ML-% IJ SOLN
INTRAMUSCULAR | Status: AC
  Filled 2015-01-09: qty 500

## 2015-01-09 MED ORDER — SODIUM CHLORIDE 0.9 % IV SOLN
INTRAVENOUS | Status: AC
  Administered 2015-01-09: 19:00:00 via INTRAVENOUS

## 2015-01-09 MED ORDER — PANTOPRAZOLE SODIUM 40 MG PO TBEC
40.0000 mg | DELAYED_RELEASE_TABLET | Freq: Every day | ORAL | Status: DC
  Administered 2015-01-09 – 2015-01-10 (×2): 40 mg via ORAL
  Filled 2015-01-09 (×2): qty 1

## 2015-01-09 MED ORDER — SODIUM CHLORIDE 0.9 % IJ SOLN: 3.0000 mL | Freq: Two times a day (BID) | INTRAMUSCULAR | Status: DC

## 2015-01-09 MED ORDER — LIDOCAINE HCL (PF) 1 % IJ SOLN
INTRAMUSCULAR | Status: AC
  Filled 2015-01-09: qty 30

## 2015-01-09 MED ORDER — FENTANYL CITRATE 0.05 MG/ML IJ SOLN
INTRAMUSCULAR | Status: AC
  Filled 2015-01-09: qty 2

## 2015-01-09 MED ORDER — OXYBUTYNIN CHLORIDE 5 MG PO TABS: 5.0000 mg | ORAL_TABLET | Freq: Four times a day (QID) | ORAL | Status: DC | PRN

## 2015-01-09 MED ORDER — ACETAMINOPHEN 325 MG PO TABS: 975.0000 mg | ORAL_TABLET | Freq: Three times a day (TID) | ORAL | Status: DC | PRN

## 2015-01-09 MED ORDER — SODIUM CHLORIDE 0.9 % IV SOLN: 250.0000 mL | INTRAVENOUS | Status: DC | PRN

## 2015-01-09 MED ORDER — VITAMIN D3 25 MCG (1000 UNIT) PO TABS
3000.0000 [IU] | ORAL_TABLET | Freq: Every day | ORAL | Status: DC
  Administered 2015-01-09 – 2015-01-10 (×2): 3000 [IU] via ORAL
  Filled 2015-01-09 (×5): qty 3

## 2015-01-09 MED ORDER — HEPARIN (PORCINE) IN NACL 2-0.9 UNIT/ML-% IJ SOLN
INTRAMUSCULAR | Status: AC
  Filled 2015-01-09: qty 1000

## 2015-01-09 MED ORDER — REGADENOSON 0.4 MG/5ML IV SOLN
INTRAVENOUS | Status: AC
  Administered 2015-01-09: 0.4 mg via INTRAVENOUS
  Filled 2015-01-09: qty 5

## 2015-01-09 MED ORDER — NITROGLYCERIN 1 MG/10 ML FOR IR/CATH LAB
INTRA_ARTERIAL | Status: AC
  Filled 2015-01-09: qty 10

## 2015-01-09 MED ORDER — ATORVASTATIN CALCIUM 40 MG PO TABS
40.0000 mg | ORAL_TABLET | Freq: Every day | ORAL | Status: DC
  Administered 2015-01-09 – 2015-01-10 (×2): 40 mg via ORAL
  Filled 2015-01-09 (×2): qty 1

## 2015-01-09 MED ORDER — INSULIN ASPART 100 UNIT/ML ~~LOC~~ SOLN: 35.0000 [IU] | Freq: Three times a day (TID) | SUBCUTANEOUS | Status: DC

## 2015-01-09 MED ORDER — ROPINIROLE HCL 0.5 MG PO TABS: 0.5000 mg | ORAL_TABLET | Freq: Every day | ORAL | Status: DC

## 2015-01-09 MED ORDER — SODIUM CHLORIDE 0.9 % IV SOLN: 1.0000 mL/kg/h | INTRAVENOUS | Status: DC

## 2015-01-09 MED ORDER — MIDAZOLAM HCL 2 MG/2ML IJ SOLN
INTRAMUSCULAR | Status: AC
  Filled 2015-01-09: qty 2

## 2015-01-09 MED ORDER — FERROUS SULFATE 325 (65 FE) MG PO TABS
325.0000 mg | ORAL_TABLET | Freq: Three times a day (TID) | ORAL | Status: DC
  Administered 2015-01-10 (×2): 325 mg via ORAL
  Filled 2015-01-09 (×2): qty 1

## 2015-01-09 MED ORDER — ASPIRIN 81 MG PO CHEW
81.0000 mg | CHEWABLE_TABLET | Freq: Every day | ORAL | Status: DC
  Administered 2015-01-10: 81 mg via ORAL
  Filled 2015-01-09: qty 1

## 2015-01-09 MED ORDER — CARVEDILOL 3.125 MG PO TABS: 3.1250 mg | ORAL_TABLET | Freq: Two times a day (BID) | ORAL | Status: DC

## 2015-01-09 MED ORDER — ACETAMINOPHEN 325 MG PO TABS: 650.0000 mg | ORAL_TABLET | ORAL | Status: DC | PRN

## 2015-01-09 MED ORDER — REGADENOSON 0.4 MG/5ML IV SOLN
0.4000 mg | Freq: Once | INTRAVENOUS | Status: AC
  Administered 2015-01-09: 0.4 mg via INTRAVENOUS
  Filled 2015-01-09: qty 5

## 2015-01-09 MED ORDER — ASPIRIN 81 MG PO CHEW: 81.0000 mg | CHEWABLE_TABLET | ORAL | Status: DC

## 2015-01-09 MED ORDER — ONDANSETRON HCL 4 MG/2ML IJ SOLN: 4.0000 mg | Freq: Four times a day (QID) | INTRAMUSCULAR | Status: DC | PRN

## 2015-01-09 MED ORDER — TECHNETIUM TC 99M SESTAMIBI GENERIC - CARDIOLITE
10.0000 | Freq: Once | INTRAVENOUS | Status: AC | PRN
  Administered 2015-01-09: 10 via INTRAVENOUS

## 2015-01-09 MED ORDER — SODIUM CHLORIDE 0.9 % IJ SOLN: 3.0000 mL | INTRAMUSCULAR | Status: DC | PRN

## 2015-01-09 MED ORDER — GABAPENTIN 300 MG PO CAPS
600.0000 mg | ORAL_CAPSULE | Freq: Three times a day (TID) | ORAL | Status: DC
  Administered 2015-01-09 – 2015-01-10 (×2): 600 mg via ORAL
  Filled 2015-01-09 (×2): qty 2

## 2015-01-09 MED ORDER — TECHNETIUM TC 99M SESTAMIBI GENERIC - CARDIOLITE
30.0000 | Freq: Once | INTRAVENOUS | Status: AC | PRN
  Administered 2015-01-09: 30 via INTRAVENOUS

## 2015-01-09 MED ORDER — ACETAMINOPHEN 325 MG PO TABS
975.0000 mg | ORAL_TABLET | Freq: Four times a day (QID) | ORAL | Status: DC | PRN
Start: 2015-01-09 — End: 2015-01-09

## 2015-01-09 NOTE — CV Procedure (Signed)
Left cardiac cath report dictated on 01/09/2015 dictation number is [id number]

## 2015-01-09 NOTE — H&P (View-Only) (Signed)
Subjective:  Patient seen in nuclear medicine department. Denies any chest pain or shortness of breath. Denies any arm pain. Tolerated stress portion of the nuclear test okay  Objective:  Vital Signs in the last 24 hours: Temp:  [97.4 F (36.3 C)-98.3 F (36.8 C)] 97.7 F (36.5 C) (02/12 0649) Pulse Rate:  [63-100] 75 (02/12 1055) Resp:  [13-22] 20 (02/11 1806) BP: (110-152)/(55-99) 137/66 mmHg (02/12 1055) SpO2:  [93 %-97 %] 95 % (02/11 2240) Weight:  [125.737 kg (277 lb 3.2 oz)-126.191 kg (278 lb 3.2 oz)] 125.737 kg (277 lb 3.2 oz) (02/11 1806)  Intake/Output from previous day: 02/11 0701 - 02/12 0700 In: 480 [P.O.:480] Out: -  Intake/Output from this shift: Total I/O In: 300 [P.O.:300] Out: -   Physical Exam: Neck: no adenopathy, no carotid bruit, no JVD and supple, symmetrical, trachea midline Lungs: clear to auscultation bilaterally Heart: regular rate and rhythm, S1, S2 normal, no murmur, click, rub or gallop Abdomen: soft, non-tender; bowel sounds normal; no masses,  no organomegaly Extremities: extremities normal, atraumatic, no cyanosis or edema  Lab Results:  Recent Labs  01/08/15 1331  WBC 5.0  HGB 13.0  PLT 209    Recent Labs  01/08/15 1331  NA 141  K 4.1  CL 104  CO2 25  GLUCOSE 141*  BUN 20  CREATININE 0.86    Recent Labs  01/08/15 2130 01/09/15  TROPONINI <0.03 <0.03   Hepatic Function Panel No results for input(s): PROT, ALBUMIN, AST, ALT, ALKPHOS, BILITOT, BILIDIR, IBILI in the last 72 hours. No results for input(s): CHOL in the last 72 hours. No results for input(s): PROTIME in the last 72 hours.  Imaging: Imaging results have been reviewed and Dg Chest 2 View  01/08/2015   CLINICAL DATA:  Acute left-sided chest pain.  EXAM: CHEST  2 VIEW  COMPARISON:  None.  FINDINGS: The heart size and mediastinal contours are within normal limits. No pneumothorax or pleural effusion is noted. Mild central pulmonary vascular congestion is noted. No  consolidative process is noted. The visualized skeletal structures are unremarkable.  IMPRESSION: Mild central pulmonary vascular congestion is noted. Mild bilateral perihilar pulmonary edema cannot be excluded.   Electronically Signed   By: Marijo Conception, M.D.   On: 01/08/2015 14:20   Ct Angio Chest Pe W/cm &/or Wo Cm  01/08/2015   CLINICAL DATA:  Acute left-sided chest pain with shortness of breath.  EXAM: CT ANGIOGRAPHY CHEST WITH CONTRAST  TECHNIQUE: Multidetector CT imaging of the chest was performed using the standard protocol during bolus administration of intravenous contrast. Multiplanar CT image reconstructions and MIPs were obtained to evaluate the vascular anatomy.  CONTRAST:  162mL OMNIPAQUE IOHEXOL 350 MG/ML SOLN  COMPARISON:  Chest radiograph of same day.  FINDINGS: No pneumothorax or pleural effusion is noted. No acute pulmonary disease is noted. There is no evidence of pulmonary embolus. There is no evidence of thoracic aortic dissection or aneurysm. Visualized portion of upper abdomen appears normal. No mediastinal mass or adenopathy is noted. No significant osseous abnormality is noted.  Review of the MIP images confirms the above findings.  IMPRESSION: No evidence of pulmonary embolus. No significant abnormality seen in the chest.   Electronically Signed   By: Marijo Conception, M.D.   On: 01/08/2015 16:16    Cardiac Studies:  Assessment/Plan:  Status post Chest pain/left arm pain worrisome for angina MI ruled out Hypertension Diabetes mellitus COPD Morbid obesity Remote tobacco abuse History of DVT in the  past Bladder carcinoma   Plan Continue present management Check nuclear test results okay to discharge if no evidence of ischemia from cardiac point of view  Ricky Johnston N 01/09/2015, 1:14 PM

## 2015-01-09 NOTE — Progress Notes (Signed)
Site area: Right groin a 5 french arterial sheath was removed  Site Prior to Removal:  Level 0  Pressure Applied For 20 MINUTES    Minutes Beginning at 1805p  Manual:   Yes.    Patient Status During Pull:  stable  Post Pull Groin Site:  Level 0  Post Pull Instructions Given:  Yes.    Post Pull Pulses Present:  Yes.    Dressing Applied:  Yes.    Comments:  VS remain stable during sheath pull.  Pt denies any discomfort at this time.

## 2015-01-09 NOTE — Progress Notes (Signed)
Subjective:  Patient seen in nuclear medicine department. Denies any chest pain or shortness of breath. Denies any arm pain. Tolerated stress portion of the nuclear test okay  Objective:  Vital Signs in the last 24 hours: Temp:  [97.4 F (36.3 C)-98.3 F (36.8 C)] 97.7 F (36.5 C) (02/12 0649) Pulse Rate:  [63-100] 75 (02/12 1055) Resp:  [13-22] 20 (02/11 1806) BP: (110-152)/(55-99) 137/66 mmHg (02/12 1055) SpO2:  [93 %-97 %] 95 % (02/11 2240) Weight:  [125.737 kg (277 lb 3.2 oz)-126.191 kg (278 lb 3.2 oz)] 125.737 kg (277 lb 3.2 oz) (02/11 1806)  Intake/Output from previous day: 02/11 0701 - 02/12 0700 In: 480 [P.O.:480] Out: -  Intake/Output from this shift: Total I/O In: 300 [P.O.:300] Out: -   Physical Exam: Neck: no adenopathy, no carotid bruit, no JVD and supple, symmetrical, trachea midline Lungs: clear to auscultation bilaterally Heart: regular rate and rhythm, S1, S2 normal, no murmur, click, rub or gallop Abdomen: soft, non-tender; bowel sounds normal; no masses,  no organomegaly Extremities: extremities normal, atraumatic, no cyanosis or edema  Lab Results:  Recent Labs  01/08/15 1331  WBC 5.0  HGB 13.0  PLT 209    Recent Labs  01/08/15 1331  NA 141  K 4.1  CL 104  CO2 25  GLUCOSE 141*  BUN 20  CREATININE 0.86    Recent Labs  01/08/15 2130 01/09/15  TROPONINI <0.03 <0.03   Hepatic Function Panel No results for input(s): PROT, ALBUMIN, AST, ALT, ALKPHOS, BILITOT, BILIDIR, IBILI in the last 72 hours. No results for input(s): CHOL in the last 72 hours. No results for input(s): PROTIME in the last 72 hours.  Imaging: Imaging results have been reviewed and Dg Chest 2 View  01/08/2015   CLINICAL DATA:  Acute left-sided chest pain.  EXAM: CHEST  2 VIEW  COMPARISON:  None.  FINDINGS: The heart size and mediastinal contours are within normal limits. No pneumothorax or pleural effusion is noted. Mild central pulmonary vascular congestion is noted. No  consolidative process is noted. The visualized skeletal structures are unremarkable.  IMPRESSION: Mild central pulmonary vascular congestion is noted. Mild bilateral perihilar pulmonary edema cannot be excluded.   Electronically Signed   By: Marijo Conception, M.D.   On: 01/08/2015 14:20   Ct Angio Chest Pe W/cm &/or Wo Cm  01/08/2015   CLINICAL DATA:  Acute left-sided chest pain with shortness of breath.  EXAM: CT ANGIOGRAPHY CHEST WITH CONTRAST  TECHNIQUE: Multidetector CT imaging of the chest was performed using the standard protocol during bolus administration of intravenous contrast. Multiplanar CT image reconstructions and MIPs were obtained to evaluate the vascular anatomy.  CONTRAST:  149mL OMNIPAQUE IOHEXOL 350 MG/ML SOLN  COMPARISON:  Chest radiograph of same day.  FINDINGS: No pneumothorax or pleural effusion is noted. No acute pulmonary disease is noted. There is no evidence of pulmonary embolus. There is no evidence of thoracic aortic dissection or aneurysm. Visualized portion of upper abdomen appears normal. No mediastinal mass or adenopathy is noted. No significant osseous abnormality is noted.  Review of the MIP images confirms the above findings.  IMPRESSION: No evidence of pulmonary embolus. No significant abnormality seen in the chest.   Electronically Signed   By: Marijo Conception, M.D.   On: 01/08/2015 16:16    Cardiac Studies:  Assessment/Plan:  Status post Chest pain/left arm pain worrisome for angina MI ruled out Hypertension Diabetes mellitus COPD Morbid obesity Remote tobacco abuse History of DVT in the  past Bladder carcinoma   Plan Continue present management Check nuclear test results okay to discharge if no evidence of ischemia from cardiac point of view  Ricky Johnston N 01/09/2015, 1:14 PM

## 2015-01-09 NOTE — Progress Notes (Signed)
UR completed 

## 2015-01-09 NOTE — Interval H&P Note (Signed)
Cath Lab Visit (complete for each Cath Lab visit)  Clinical Evaluation Leading to the Procedure:   ACS: No.  Non-ACS:    Anginal Classification: CCS III  Anti-ischemic medical therapy: Maximal Therapy (2 or more classes of medications)  Non-Invasive Test Results: Intermediate-risk stress test findings: cardiac mortality 1-3%/year  Prior CABG: No previous CABG      History and Physical Interval Note:  01/09/2015 5:03 PM  Ricky Johnston  has presented today for surgery, with the diagnosis of abnormal stress test  The various methods of treatment have been discussed with the patient and family. After consideration of risks, benefits and other options for treatment, the patient has consented to  Procedure(s): LEFT HEART CATHETERIZATION WITH CORONARY ANGIOGRAM (N/A) as a surgical intervention .  The patient's history has been reviewed, patient examined, no change in status, stable for surgery.  I have reviewed the patient's chart and labs.  Questions were answered to the patient's satisfaction.     Clent Demark

## 2015-01-09 NOTE — Progress Notes (Signed)
[facility] PROGRESS NOTE  Ricky Johnston TZG:[id number] DOB: [birth year] DOA: 01/08/2015 PCP: No PCP Per Patient  Assessment/Plan: Chest pain at rest and SOB on exertion (concerns for unstable angina) -Lasted 20 minutes, is not reproducible. He is chest pain free now Troponin neg X3 normal, EKG w/o acute ischemic process -stress test with intermediate risk and hypokinesis -plan is for left cath today -continue statins, ASA, b-blocker and ACE -cardiology consulted -will follow rec's -heart score 5  Hx of Diastolic dysfunction -stress test demonstrated EF in the 48% -will continue daily weight and low sodium diet -strict intake and output -already on ACE and b-blocker -assess EF on cath  Insulin-dependent diabetes  -Continue SSI and lantus -modified carb diet -A1c pending  COPD  -Currently stable.  -continue home regimen and PRN oxygen supplementation  Hyperlipidemia -Will continue statin. Need pharmacy to verify statin home dose.   Hypertension -Continue Lasix, lisinopril and added Coreg 3.125 mg twice a day  -BP stable and tolerating added coreg  Code Status: Full Family Communication: wife at bedside Disposition Plan: home when medically stable   Consultants:  Cardiology   Procedures:  Myoview: with intermediate probability of reversible ischemia; EF 48%  See below for x-ray reports   Antibiotics:  None   HPI/Subjective: Denies CP or SOB. Troponin Neg X 3 and no EKG changes this morning.  Objective: Filed Vitals:   01/09/15 1343  BP: 148/72  Pulse: 79  Temp: 97.8 F (36.6 C)  Resp:     Intake/Output Summary (Last 24 hours) at 01/09/15 1658 Last data filed at 01/09/15 1342  Gross per 24 hour  Intake   1020 ml  Output      0 ml  Net   1020 ml   Filed Weights   01/08/15 1325 01/08/15 1806  Weight: 126.191 kg (278 lb 3.2 oz) 125.737 kg (277 lb 3.2 oz)    Exam:   General:  No CP, no SOB. reports he is feeling ok  today.  Cardiovascular: S1 and S2, no rubs or gallops  Respiratory: CTA bilaterally  Abdomen: soft, obese, positive BS, no tenderness  Musculoskeletal: no cyanosis or clubbing  Data Reviewed: Basic Metabolic Panel:  Recent Labs Lab 01/08/15 1331  NA 141  K 4.1  CL 104  CO2 25  GLUCOSE 141*  BUN 20  CREATININE 0.86  CALCIUM 9.1   CBC:  Recent Labs Lab 01/08/15 1331  WBC 5.0  HGB 13.0  HCT 41.8  MCV 84.4  PLT 209   Cardiac Enzymes:  Recent Labs Lab 01/08/15 1410 01/08/15 1908 01/08/15 2130 01/09/15  TROPONINI <0.03 <0.03 <0.03 <0.03   BNP (last 3 results)  Recent Labs  01/08/15 1331  BNP 13.9   CBG:  Recent Labs Lab 01/08/15 1820 01/08/15 2123 01/09/15 0758 01/09/15 1211 01/09/15 1624  GLUCAP 83 254* 184* 183* 185*    No results found for this or any previous visit (from the past 240 hour(s)).   Studies: Dg Chest 2 View  01/08/2015   CLINICAL DATA:  Acute left-sided chest pain.  EXAM: CHEST  2 VIEW  COMPARISON:  None.  FINDINGS: The heart size and mediastinal contours are within normal limits. No pneumothorax or pleural effusion is noted. Mild central pulmonary vascular congestion is noted. No consolidative process is noted. The visualized skeletal structures are unremarkable.  IMPRESSION: Mild central pulmonary vascular congestion is noted. Mild bilateral perihilar pulmonary edema cannot be excluded.   Electronically Signed   By: Marijo Conception, M.D.  On: 01/08/2015 14:20   Ct Angio Chest Pe W/cm &/or Wo Cm  01/08/2015   CLINICAL DATA:  Acute left-sided chest pain with shortness of breath.  EXAM: CT ANGIOGRAPHY CHEST WITH CONTRAST  TECHNIQUE: Multidetector CT imaging of the chest was performed using the standard protocol during bolus administration of intravenous contrast. Multiplanar CT image reconstructions and MIPs were obtained to evaluate the vascular anatomy.  CONTRAST:  185mL OMNIPAQUE IOHEXOL 350 MG/ML SOLN  COMPARISON:  Chest radiograph  of same day.  FINDINGS: No pneumothorax or pleural effusion is noted. No acute pulmonary disease is noted. There is no evidence of pulmonary embolus. There is no evidence of thoracic aortic dissection or aneurysm. Visualized portion of upper abdomen appears normal. No mediastinal mass or adenopathy is noted. No significant osseous abnormality is noted.  Review of the MIP images confirms the above findings.  IMPRESSION: No evidence of pulmonary embolus. No significant abnormality seen in the chest.   Electronically Signed   By: Marijo Conception, M.D.   On: 01/08/2015 16:16   Nm Myocar Multi W/spect W/wall Motion / Ef  01/09/2015   CLINICAL DATA:  64 year old with chest pain, unspecified chest pain type.  EXAM: MYOCARDIAL IMAGING WITH SPECT (REST AND PHARMACOLOGIC-STRESS)  GATED LEFT VENTRICULAR WALL MOTION STUDY  LEFT VENTRICULAR EJECTION FRACTION  TECHNIQUE: Standard myocardial SPECT imaging was performed after resting intravenous injection of 10 mCi Tc-54m sestamibi. Subsequently, intravenous infusion of Lexiscan was performed under the supervision of the Cardiology staff. At peak effect of the drug, 30 mCi Tc-22m sestamibi was injected intravenously and standard myocardial SPECT imaging was performed. Quantitative gated imaging was also performed to evaluate left ventricular wall motion, and estimate left ventricular ejection fraction.  COMPARISON:  Chest CT 01/08/2015  FINDINGS: Perfusion: There is a large fixed defect involving the inferolateral wall. Findings are compatible with an area of infarct. There is concern for reversibility along the inferior wall in the mid segment. The orientation of the left ventricle is slightly different on the rest and stress images which could create false positive reversibility.  Wall Motion: There is mild hypokinesia, particularly along the inferior wall.  Left Ventricular Ejection Fraction: 48 %  End diastolic volume 948 ml  End systolic volume 75 ml  IMPRESSION: 1. Large  fixed defect involving the inferolateral wall is consistent with an area of infarct. There is concern for ischemia and reversibility along the mid segment of the inferior wall. However, this reversibility could be artifactual related to differences in left ventricle orientation between the rest and stress images. However, an area of reversibility cannot be excluded in the inferior wall.  2. Mild hypokinesia.  3. Left ventricular ejection fraction is 48%.  4. Intermediate-risk stress test findings*.  *2012 Appropriate Use Criteria for Coronary Revascularization Focused Update: J Am Coll Cardiol. 5462;70(3):500-938. http://content.airportbarriers.com.aspx?articleid=1201161   Electronically Signed   By: Markus Daft M.D.   On: 01/09/2015 14:04    Scheduled Meds: . [START ON 01/10/2015] aspirin  81 mg Oral Pre-Cath  . [MAR Hold] aspirin EC  325 mg Oral Daily  . [MAR Hold] carvedilol  3.125 mg Oral BID WC  . [MAR Hold] enoxaparin (LOVENOX) injection  60 mg Subcutaneous Q24H  . [MAR Hold] furosemide  20 mg Oral BID  . [MAR Hold] insulin aspart  0-15 Units Subcutaneous TID WC  . [MAR Hold] insulin aspart  0-5 Units Subcutaneous QHS  . [MAR Hold] insulin aspart  30 Units Subcutaneous TID WC  . [MAR Hold] insulin glargine  30 Units Subcutaneous BID  . [MAR Hold] lisinopril  20 mg Oral Daily  . [MAR Hold] rOPINIRole  0.5 mg Oral QHS  . [MAR Hold] simvastatin  10 mg Oral q1800  . sodium chloride  3 mL Intravenous Q12H   Continuous Infusions: . sodium chloride      Principal Problem:   Chest pain Active Problems:   Insulin dependent diabetes mellitus   COPD (chronic obstructive pulmonary disease)   Hyperlipidemia associated with type 2 diabetes mellitus   Pain in the chest   Chronic systolic heart failure   Esophageal reflux    Time spent: 30 minutes    Barton Dubois  [facility] Pager [phone]. If 7PM-7AM, please contact night-coverage at [email or web address], password [facility] 01/09/2015,  4:58 PM

## 2015-01-10 ENCOUNTER — Encounter (HOSPITAL_COMMUNITY): Payer: Self-pay | Admitting: Cardiology

## 2015-01-10 DIAGNOSIS — R079 Chest pain, unspecified: Secondary | ICD-10-CM | POA: Diagnosis not present

## 2015-01-10 DIAGNOSIS — I5022 Chronic systolic (congestive) heart failure: Secondary | ICD-10-CM

## 2015-01-10 LAB — HEMOGLOBIN A1C
Hgb A1c MFr Bld: 8.1 % — ABNORMAL HIGH (ref 4.8–5.6)
Mean Plasma Glucose: 186 mg/dL

## 2015-01-10 LAB — GLUCOSE, CAPILLARY
Glucose-Capillary: 197 mg/dL — ABNORMAL HIGH (ref 70–99)
Glucose-Capillary: 160 mg/dL — ABNORMAL HIGH (ref 70–99)

## 2015-01-10 MED ORDER — ASPIRIN 81 MG PO CHEW: 81.0000 mg | CHEWABLE_TABLET | Freq: Every day | ORAL | Status: DC

## 2015-01-10 NOTE — Progress Notes (Signed)
Ref: No PCP Per Patient   Subjective:  No chest pain. No right groin hematoma or discharge. Afebrile. No significant lesion in coronary arteries needing angioplasty.  Objective:  Vital Signs in the last 24 hours: Temp:  [97.8 F (36.6 C)] 97.8 F (36.6 C) (02/13 0711) Pulse Rate:  [56-79] 70 (02/13 0711) Cardiac Rhythm:  [-] Normal sinus rhythm (02/13 0940) Resp:  [17-23] 20 (02/13 0711) BP: (126-149)/(62-79) 149/68 mmHg (02/13 0711) SpO2:  [88 %-98 %] 95 % (02/13 0711) Weight:  [123.469 kg (272 lb 3.2 oz)] 123.469 kg (272 lb 3.2 oz) (02/13 0711)  Physical Exam: BP Readings from Last 1 Encounters:  01/10/15 149/68     Wt Readings from Last 1 Encounters:  01/10/15 123.469 kg (272 lb 3.2 oz)    Weight change:   HEENT: [place]/AT, Eyes- PERL, EOMI, Conjunctiva-Pink, Sclera-Non-icteric Neck: No JVD, No bruit, Trachea midline. Lungs:  Clear, Bilateral. Cardiac:  Regular rhythm, normal S1 and S2, no S3.  Abdomen:  Soft, non-tender. Extremities:  1 + edema present. No cyanosis. No clubbing. CNS: AxOx3, Cranial nerves grossly intact, moves all 4 extremities. Right handed. Skin: Warm and dry.   Intake/Output from previous day: 02/12 0701 - 02/13 0700 In: 640 [P.O.:640] Out: -     Lab Results: BMET    Component Value Date/Time   NA 141 01/08/2015 1331   K 4.1 01/08/2015 1331   CL 104 01/08/2015 1331   CO2 25 01/08/2015 1331   GLUCOSE 141* 01/08/2015 1331   BUN 20 01/08/2015 1331   CREATININE 0.86 01/08/2015 1331   CALCIUM 9.1 01/08/2015 1331   GFRNONAA >90 01/08/2015 1331   GFRAA >90 01/08/2015 1331   CBC    Component Value Date/Time   WBC 5.0 01/08/2015 1331   RBC 4.95 01/08/2015 1331   HGB 13.0 01/08/2015 1331   HCT 41.8 01/08/2015 1331   PLT 209 01/08/2015 1331   MCV 84.4 01/08/2015 1331   MCH 26.3 01/08/2015 1331   MCHC 31.1 01/08/2015 1331   RDW 17.5* 01/08/2015 1331   HEPATIC Function Panel No results for input(s): PROT in the last 8760 hours.  Invalid  input(s):  ALBUMIN,  AST,  ALT,  ALKPHOS,  BILIDIR,  IBILI HEMOGLOBIN A1C No components found for: HGA1C,  MPG CARDIAC ENZYMES Lab Results  Component Value Date   TROPONINI <0.03 01/09/2015   TROPONINI <0.03 01/08/2015   TROPONINI <0.03 01/08/2015   BNP No results for input(s): PROBNP in the last 8760 hours. TSH No results for input(s): TSH in the last 8760 hours. CHOLESTEROL No results for input(s): CHOL in the last 8760 hours.  Scheduled Meds: . aspirin  81 mg Oral Daily  . atorvastatin  40 mg Oral Daily  . carvedilol  3.125 mg Oral BID WC  . cholecalciferol  3,000 Units Oral Daily  . ferrous sulfate  325 mg Oral TID WC  . furosemide  20 mg Oral BID  . gabapentin  600 mg Oral TID  . insulin aspart  0-15 Units Subcutaneous TID WC  . insulin aspart  0-5 Units Subcutaneous QHS  . insulin aspart  30 Units Subcutaneous TID WC  . insulin glargine  30 Units Subcutaneous BID  . lisinopril  20 mg Oral Daily  . pantoprazole  40 mg Oral Daily  . rOPINIRole  0.5 mg Oral QHS   Continuous Infusions:  PRN Meds:.acetaminophen, gi cocktail, morphine injection, ondansetron (ZOFRAN) IV, oxybutynin  Assessment/Plan: Unstable angina CAD  Hypertension Diabetes mellitus, II COPD Morbid obesity Remote  tobacco abuse History of DVT in the past Bladder carcinoma Hyperlipidemia  Continue medical treatment.   Dixie Dials  MD  01/10/2015, 1:03 PM

## 2015-01-10 NOTE — Discharge Summary (Signed)
Physician Discharge Summary  Ricky Johnston JYN:[id number] DOB: 1950-12-20 DOA: 01/08/2015  PCP: No PCP Per Patient  Admit date: 01/08/2015 Discharge date: 01/10/2015  Time spent: 30 minutes  Recommendations for Outpatient Follow-up:  Repeat BMET to assess electrolytes and renal function Reassess BP and adjust medications as needed  Discharge Diagnoses:  Principal Problem:   Chest pain Active Problems:   Insulin dependent diabetes mellitus   COPD (chronic obstructive pulmonary disease)   Hyperlipidemia associated with type 2 diabetes mellitus   Pain in the chest   Chronic systolic heart failure   Esophageal reflux   Discharge Condition: stable and improved. No CP or SOB. Patient discharge home. Will follow with Dr. Terrence Dupont in 2 weeks  Diet recommendation: heart healthy and low carbohydrates diet  Filed Weights   01/08/15 1325 01/08/15 1806 01/10/15 0711  Weight: 126.191 kg (278 lb 3.2 oz) 125.737 kg (277 lb 3.2 oz) 123.469 kg (272 lb 3.2 oz)    History of present illness:  64 y.o. male, with a past medical history of upper extremity DVT, active bladder cancer (scheduled for resection), COPD, and insulin-dependent diabetes mellitus. He reports sitting in his car at rest when he developed sudden sharp left-sided chest pain that radiated up into his neck and down his left arm. His face felt hot. This lasted about 20 minutes and eased off by itself. The patient reports no recent history of such symptoms. He denies orthopnea and PND. He reports no change in his exercise tolerance. He normally walks slowly and becomes short of breath on exertion due to his COPD. He uses oxygen PRN at home. He was hospitalized at the [facility] in December with a COPD exacerbation at that time he had a 2-D echo which showed diastolic dysfunction. In 2004 he had a cardiac catheterization in [place], and reports he had clean coronary arteries.  In the ER, his troponin is normal, his EKG does not show  any ST elevation or depression, and his chest pain has not returned.  Hospital Course:  Chest pain at rest and SOB on exertion (concerns for unstable angina) -He is chest pain free now -Troponin neg X3 normal, EKG w/o acute ischemic process -stress test with intermediate risk and hypokinesis; then had left cath on 2/12; no obstructive CAD -continue statins, ASA, b-blocker and ACE -heart score 5  Hx of Diastolic dysfunction -stress test demonstrated EF in the 48% -will continue daily weight and low sodium diet -strict intake and output -already on ACE and b-blocker -patient to follow with cardiology (Dr. Terrence Dupont) for further medication adjustment -patient denies SOB or orthopnea  Insulin-dependent diabetes  -Continue SSI and lantus -modified carb diet -A1c 8.1  COPD  -Currently stable.  -continue home regimen and PRN oxygen supplementation  Hyperlipidemia -Will continue statin.  -advise to follow low fat diet  Hypertension -Continue Lasix, lisinopril and added Coreg 3.125 mg twice a day  -BP stable and tolerating added coreg  GERD -continue PPI  Procedures:  Myoview: with intermediate probability of reversible ischemia  See below for x-ray reports  Left heart cath: no findings of obstructive CAD or need   Consultations:  Cardiology   Discharge Exam: Filed Vitals:   01/10/15 0711  BP: 149/68  Pulse: 70  Temp: 97.8 F (36.6 C)  Resp: 20    General: No CP, no SOB. reports he is feeling ok today.  Cardiovascular: S1 and S2, no rubs or gallops  Respiratory: CTA bilaterally  Abdomen: soft, obese, positive BS, no tenderness  Musculoskeletal: no cyanosis or clubbing  Discharge Instructions   Discharge Instructions    Diet - low sodium heart healthy    Complete by:  As directed      Discharge instructions    Complete by:  As directed   Take medications as prescribed Arrange hospital follow-up visit with cardiology in 2 weeks Follow a heart  healthy diet (less than 2 g of sodium per day)          Current Discharge Medication List    START taking these medications   Details  aspirin 81 MG chewable tablet Chew 1 tablet (81 mg total) by mouth daily.    carvedilol (COREG) 3.125 MG tablet Take 1 tablet (3.125 mg total) by mouth 2 (two) times daily with a meal. Qty: 60 tablet, Refills: 1      CONTINUE these medications which have CHANGED   Details  acetaminophen (TYLENOL) 325 MG tablet Take 3 tablets (975 mg total) by mouth every 8 (eight) hours as needed for mild pain.      CONTINUE these medications which have NOT CHANGED   Details  atorvastatin (LIPITOR) 40 MG tablet Take 40 mg by mouth daily.    cholecalciferol (VITAMIN D) 1000 UNITS tablet Take 3,000 Units by mouth daily.    ferrous sulfate 325 (65 FE) MG tablet Take 325 mg by mouth 3 (three) times daily with meals.    furosemide (LASIX) 20 MG tablet Take 40 mg by mouth daily.     gabapentin (NEURONTIN) 300 MG capsule Take 600 mg by mouth 3 (three) times daily.    insulin aspart (NOVOLOG) 100 UNIT/ML injection Inject 35-60 Units into the skin 3 (three) times daily before meals. 35 units at breakfast. 35 units at lunch 60 units at bedtime    insulin glargine (LANTUS) 100 UNIT/ML injection Inject 45 Units into the skin 2 (two) times daily.    lisinopril (PRINIVIL,ZESTRIL) 20 MG tablet Take 20 mg by mouth daily.    metFORMIN (GLUCOPHAGE) 1000 MG tablet Take 1,000 mg by mouth 2 (two) times daily with a meal.    omeprazole (PRILOSEC) 20 MG capsule Take 20 mg by mouth 2 (two) times daily before a meal.    oxybutynin (DITROPAN) 5 MG tablet Take 5 mg by mouth every 6 (six) hours as needed for bladder spasms.    rOPINIRole (REQUIP) 0.5 MG tablet Take 0.5 mg by mouth at bedtime.       Allergies  Allergen Reactions  . Poison Ivy Extract [Extract Of Poison Ivy] Rash   Follow-up Information    Follow up with Clent Demark, MD. Schedule an appointment as soon as  possible for a visit in 2 weeks.   Specialty:  Cardiology   Contact information:   [address] [place] [address] [phone]       The results of significant diagnostics from this hospitalization (including imaging, microbiology, ancillary and laboratory) are listed below for reference.    Significant Diagnostic Studies: Dg Chest 2 View  01/08/2015   CLINICAL DATA:  Acute left-sided chest pain.  EXAM: CHEST  2 VIEW  COMPARISON:  None.  FINDINGS: The heart size and mediastinal contours are within normal limits. No pneumothorax or pleural effusion is noted. Mild central pulmonary vascular congestion is noted. No consolidative process is noted. The visualized skeletal structures are unremarkable.  IMPRESSION: Mild central pulmonary vascular congestion is noted. Mild bilateral perihilar pulmonary edema cannot be excluded.   Electronically Signed   By: Jeneen Rinks  Murlean Caller, M.D.   On: 01/08/2015 14:20   Ct Angio Chest Pe W/cm &/or Wo Cm  01/08/2015   CLINICAL DATA:  Acute left-sided chest pain with shortness of breath.  EXAM: CT ANGIOGRAPHY CHEST WITH CONTRAST  TECHNIQUE: Multidetector CT imaging of the chest was performed using the standard protocol during bolus administration of intravenous contrast. Multiplanar CT image reconstructions and MIPs were obtained to evaluate the vascular anatomy.  CONTRAST:  180mL OMNIPAQUE IOHEXOL 350 MG/ML SOLN  COMPARISON:  Chest radiograph of same day.  FINDINGS: No pneumothorax or pleural effusion is noted. No acute pulmonary disease is noted. There is no evidence of pulmonary embolus. There is no evidence of thoracic aortic dissection or aneurysm. Visualized portion of upper abdomen appears normal. No mediastinal mass or adenopathy is noted. No significant osseous abnormality is noted.  Review of the MIP images confirms the above findings.  IMPRESSION: No evidence of pulmonary embolus. No significant abnormality seen in the chest.   Electronically  Signed   By: Marijo Conception, M.D.   On: 01/08/2015 16:16   Nm Myocar Multi W/spect W/wall Motion / Ef  01/09/2015   CLINICAL DATA:  64 year old with chest pain, unspecified chest pain type.  EXAM: MYOCARDIAL IMAGING WITH SPECT (REST AND PHARMACOLOGIC-STRESS)  GATED LEFT VENTRICULAR WALL MOTION STUDY  LEFT VENTRICULAR EJECTION FRACTION  TECHNIQUE: Standard myocardial SPECT imaging was performed after resting intravenous injection of 10 mCi Tc-7m sestamibi. Subsequently, intravenous infusion of Lexiscan was performed under the supervision of the Cardiology staff. At peak effect of the drug, 30 mCi Tc-76m sestamibi was injected intravenously and standard myocardial SPECT imaging was performed. Quantitative gated imaging was also performed to evaluate left ventricular wall motion, and estimate left ventricular ejection fraction.  COMPARISON:  Chest CT 01/08/2015  FINDINGS: Perfusion: There is a large fixed defect involving the inferolateral wall. Findings are compatible with an area of infarct. There is concern for reversibility along the inferior wall in the mid segment. The orientation of the left ventricle is slightly different on the rest and stress images which could create false positive reversibility.  Wall Motion: There is mild hypokinesia, particularly along the inferior wall.  Left Ventricular Ejection Fraction: 48 %  End diastolic volume 528 ml  End systolic volume 75 ml  IMPRESSION: 1. Large fixed defect involving the inferolateral wall is consistent with an area of infarct. There is concern for ischemia and reversibility along the mid segment of the inferior wall. However, this reversibility could be artifactual related to differences in left ventricle orientation between the rest and stress images. However, an area of reversibility cannot be excluded in the inferior wall.  2. Mild hypokinesia.  3. Left ventricular ejection fraction is 48%.  4. Intermediate-risk stress test findings*.  *2012  Appropriate Use Criteria for Coronary Revascularization Focused Update: J Am Coll Cardiol. 4132;44(0):102-725. http://content.airportbarriers.com.aspx?articleid=1201161   Electronically Signed   By: Markus Daft M.D.   On: 01/09/2015 14:04   Labs: Basic Metabolic Panel:  Recent Labs Lab 01/08/15 1331  NA 141  K 4.1  CL 104  CO2 25  GLUCOSE 141*  BUN 20  CREATININE 0.86  CALCIUM 9.1   CBC:  Recent Labs Lab 01/08/15 1331  WBC 5.0  HGB 13.0  HCT 41.8  MCV 84.4  PLT 209   Cardiac Enzymes:  Recent Labs Lab 01/08/15 1410 01/08/15 1908 01/08/15 2130 01/09/15  TROPONINI <0.03 <0.03 <0.03 <0.03   BNP: BNP (last 3 results)  Recent Labs  01/08/15 1331  BNP 13.9   CBG:  Recent Labs Lab 01/09/15 1211 01/09/15 1624 01/09/15 1802 01/09/15 2042 01/10/15 0740  GLUCAP 183* 185* 105* 126* 197*    Signed:  Barton Dubois  [facility] 01/10/2015, 11:38 AM

## 2015-01-10 NOTE — Progress Notes (Signed)
UR completed 

## 2015-01-10 NOTE — Cardiovascular Report (Signed)
NAMEMarland Kitchen  Ricky, Johnston NO.:  [id number]  MEDICAL RECORD NO.:  [id number]  LOCATION:  3W23C                        FACILITY:  [facility]  PHYSICIAN:  Ricky Johnston, M.D. DATE OF BIRTH:  04-Apr-1951  DATE OF PROCEDURE:  01/09/2015 DATE OF DISCHARGE:                           CARDIAC CATHETERIZATION   PROCEDURE:  Left cardiac catheterization with selective left and right coronary angiography, left ventriculography via right groin using Judkins technique.  INDICATION FOR THE PROCEDURE:  Ricky Johnston is a 64 year old male with past medical history significant for hypertension, diabetes mellitus, COPD, morbid obesity, remote tobacco abuse 30 pack years quit in 2001, history of DVT, bladder carcinoma was admitted earlier yesterday complaining of retrosternal chest pain radiating to neck and left arm associated with left facial flushing, feeling associated with mild shortness of breath.  Chest pain was grade 10/10 lasted approximately 15- 20 minutes.  He took 3 aspirin with relief of chest pain.  The patient also gives history of exertional dyspnea with minimal exertion which he attributes to COPD. The patient states he had stress test approximately 1 year ago at [place] in [place] which was negative, records not available.  EKG done in the ED showed no acute ischemic changes.  His cardiac enzymes were negative. The patient was admitted to telemetry unit.  MI was ruled out by serial enzymes and EKG.  The patient subsequently underwent nuclear stress test today, which showed large fixed defect involving the inferolateral wall which is consistent with the area of infarct.  There was also concern for ischemia and reversibility along the mid segment of the inferior wall with EF of 48%.  Due to typical anginal chest pain, multiple risk factors, and intermediate risk stress test finding, discussed with the patient at length regarding left cardiac cath, possible PTCA and stenting, its  risks and benefits, i.e., death, MI, stroke, need for emergency CABG, local vascular complications, etc. and consented for PCI.  DESCRIPTION OF PROCEDURE:  After obtaining the informed consent, the patient was brought to the cath lab and was placed on fluoroscopy table. Right groin was prepped and draped in usual fashion.  A 1% Xylocaine was used for local anesthesia in the right groin.  With the help of thin wall needle, a 5-French arterial sheath was placed.  The sheath was aspirated and flushed.  A 5-French left Judkins catheter was advanced over the wire under fluoroscopic guidance up to the ascending aorta. Wire was pulled out.  The catheter was aspirated and connected to the Manifold.  Catheter was further advanced and engaged into left coronary ostium.  Multiple views of the left system were taken.  Catheter was disengaged and was pulled out over the wire and was replaced with 5- [place] right Judkins catheter, which was advanced over the wire under fluoroscopic guidance up to the ascending aorta.  Wire was pulled out. The catheter was aspirated and connected to the Manifold.  Catheter was further advanced and engaged into right coronary ostium.  Multiple views of the right system were taken.  The catheter was disengaged and was pulled out over the wire and was replaced with 5-French pigtail catheter, which was advanced over the wire under  fluoroscopic guidance up to the ascending aorta.  Wire was pulled out.  The catheter was aspirated and connected to the Manifold.  Catheter was further advanced across the aortic valve into the LV.  LV pressures were recorded.  LV graphy was done in 30-degree RAO position.  Post-angiographic pressures were recorded from LV and then pullback pressures were recorded from the aorta.  There was no gradient across the aortic valve.  Aortography was done in 45-degree LAO position as the patient has anomalous left circumflex artery which could not be  engaged with the right Judkins or Amplatz catheter.  Aortography showed patent small left circumflex arising from the separate ostium from inferior to origin of RCA.  The pigtail catheter was pulled out over the wire.  Sheaths were aspirated and flushed.  FINDINGS:  There was mild LVH, EF of 45-50%.  Left main was long which was patent.  LAD has 10-15% mid stenosis and then was diffusely diseased distally.  Diagonal 1 was small which was patent.  Diagonal 2 was very, very small.  Ramus was patent.  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  RCA is large which has 10-15% proximal and 25- 30% mid stenosis.  PDA and PLV branches were patent.  The patient tolerated the procedure well.  There were no complications. The patient was transferred to recovery room in stable condition.  PLAN:  To maximize ACE inhibitors and beta-blockers, lifestyle changes.     Ricky Johnston, M.D.     MNH/MEDQ  D:  01/09/2015  T:  01/10/2015  Job:  [id number]

## 2015-01-29 DIAGNOSIS — C679 Malignant neoplasm of bladder, unspecified: Secondary | ICD-10-CM | POA: Insufficient documentation

## 2015-01-29 DIAGNOSIS — Z6841 Body Mass Index (BMI) 40.0 and over, adult: Secondary | ICD-10-CM

## 2015-02-09 DIAGNOSIS — I2510 Atherosclerotic heart disease of native coronary artery without angina pectoris: Secondary | ICD-10-CM | POA: Insufficient documentation

## 2015-05-18 DIAGNOSIS — M205X9 Other deformities of toe(s) (acquired), unspecified foot: Secondary | ICD-10-CM

## 2015-05-18 HISTORY — DX: Other deformities of toe(s) (acquired), unspecified foot: M20.5X9

## 2016-03-02 DIAGNOSIS — G43909 Migraine, unspecified, not intractable, without status migrainosus: Secondary | ICD-10-CM | POA: Insufficient documentation

## 2016-03-02 DIAGNOSIS — N3281 Overactive bladder: Secondary | ICD-10-CM | POA: Insufficient documentation

## 2016-03-02 DIAGNOSIS — E785 Hyperlipidemia, unspecified: Secondary | ICD-10-CM | POA: Insufficient documentation

## 2016-03-02 DIAGNOSIS — E1169 Type 2 diabetes mellitus with other specified complication: Secondary | ICD-10-CM | POA: Insufficient documentation

## 2016-03-02 DIAGNOSIS — N400 Enlarged prostate without lower urinary tract symptoms: Secondary | ICD-10-CM | POA: Insufficient documentation

## 2016-03-02 DIAGNOSIS — D649 Anemia, unspecified: Secondary | ICD-10-CM | POA: Insufficient documentation

## 2016-03-18 HISTORY — PX: NEPHROURETERECTOMY: SUR876

## 2016-08-03 IMAGING — CT CT ANGIO CHEST
2 of 9 series · 19 of 46 positions shown · IV contrast (OMNI)
Comparison: Chest radiograph of same day.

CLINICAL DATA: Acute left-sided chest pain with shortness of
breath.

EXAM:
CT ANGIOGRAPHY CHEST WITH CONTRAST
TECHNIQUE: Multidetector CT imaging of the chest was performed using the
standard protocol during bolus administration of intravenous
contrast. Multiplanar CT image reconstructions and MIPs were
obtained to evaluate the vascular anatomy.
CONTRAST:  100mL OMNIPAQUE IOHEXOL 350 MG/ML SOLN

[Series 5: thins · axial · 0.92mm/px · z∈[+1212,+1500]mm · 16 of 324 slices shown]
[im 18/324  lung]
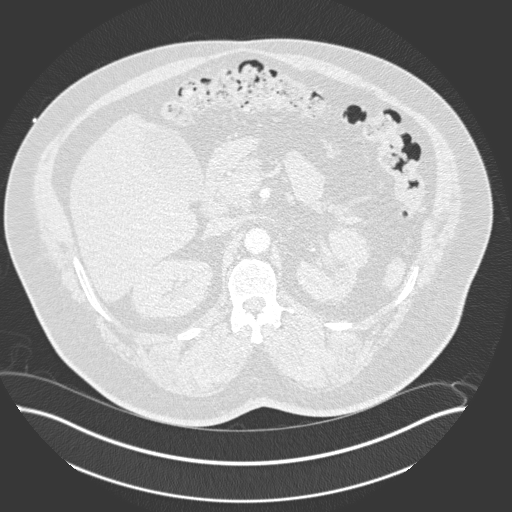
[im 36/324  soft-tissue]
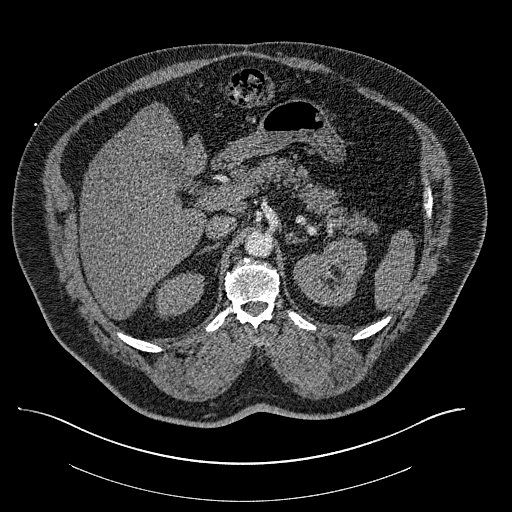
[im 54/324  lung]
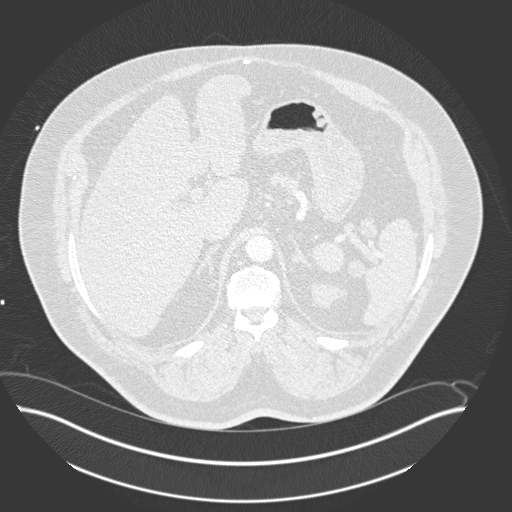
[im 72/324  soft-tissue]
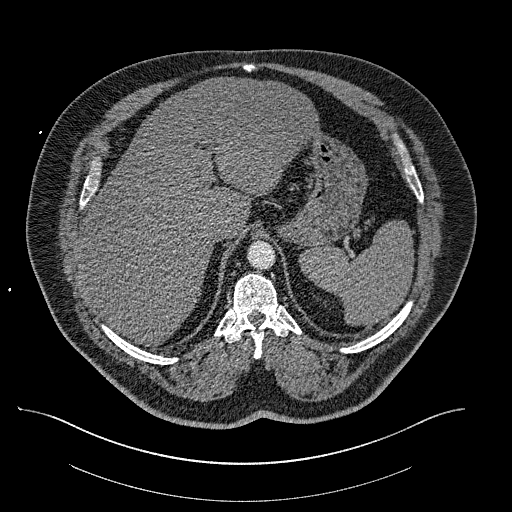
[im 90/324  lung]
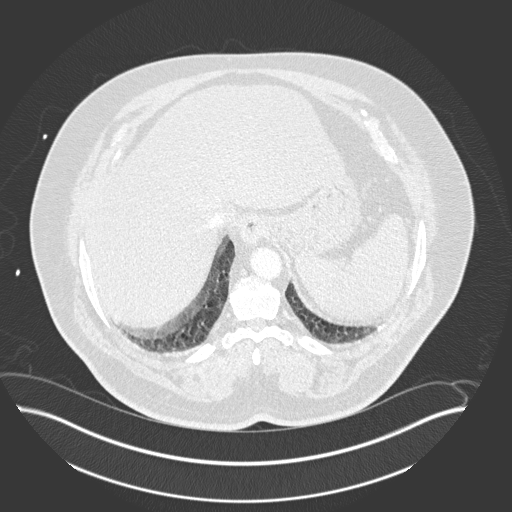
[im 108/324  soft-tissue]
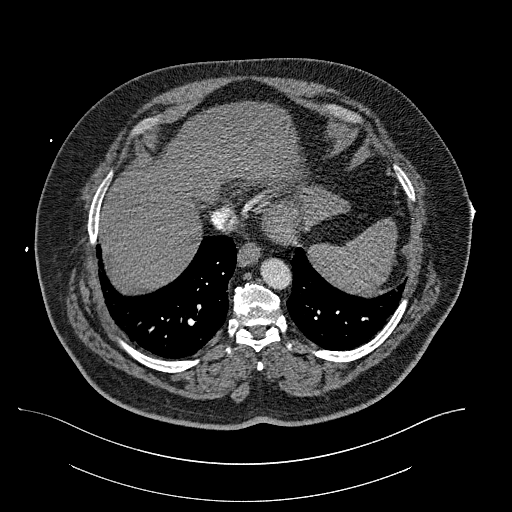
[im 126/324  lung]
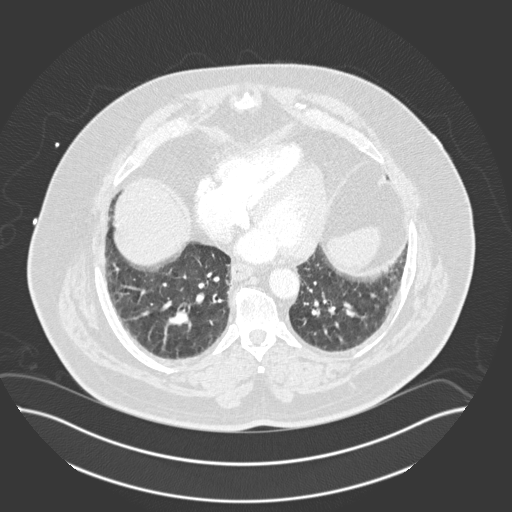
[im 144/324  soft-tissue]
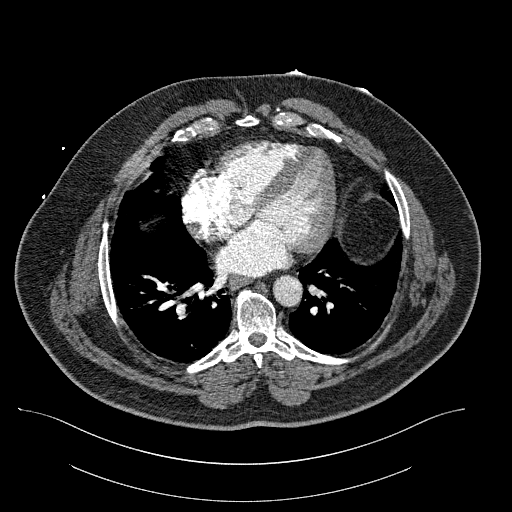
[im 180/324  lung]
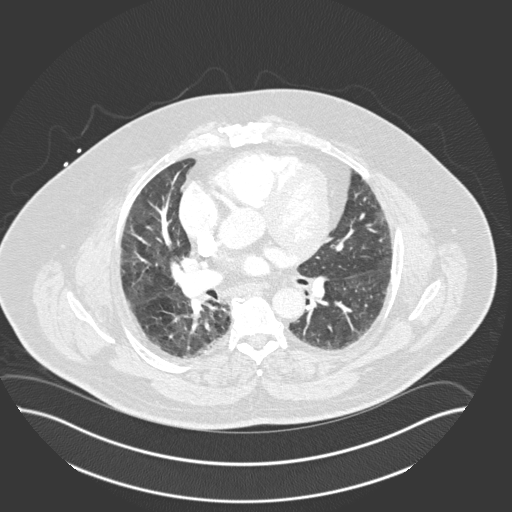
[im 198/324  soft-tissue]
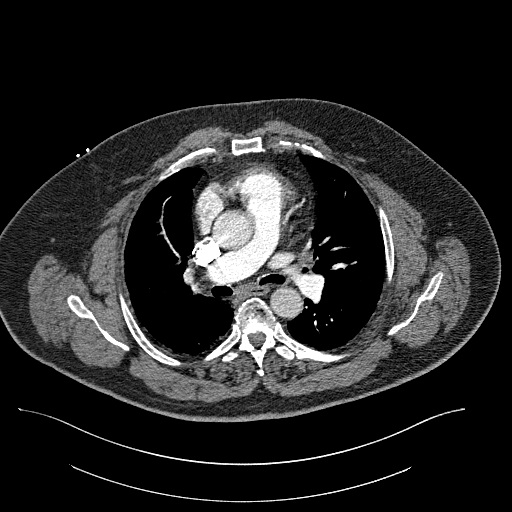
[im 216/324  lung]
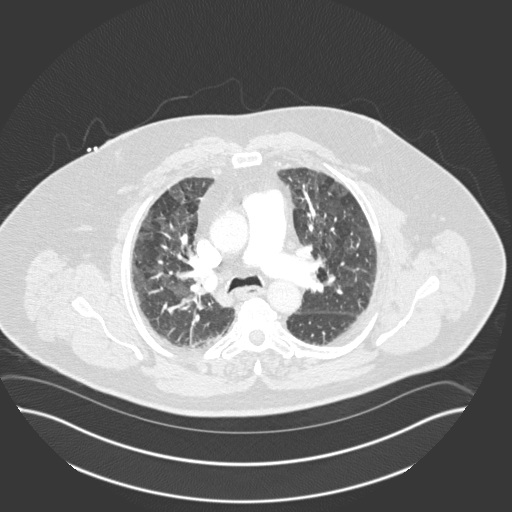
[im 234/324  soft-tissue]
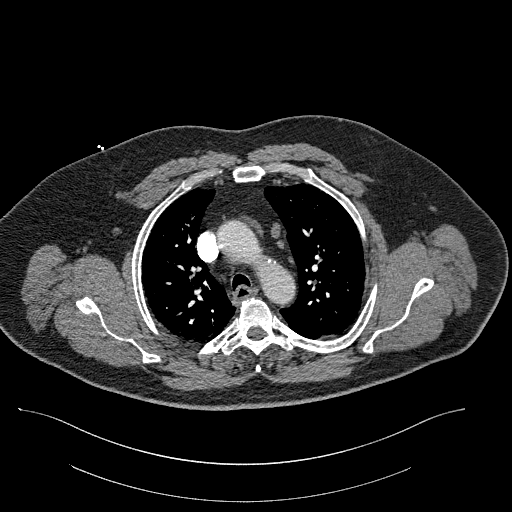
[im 252/324  lung]
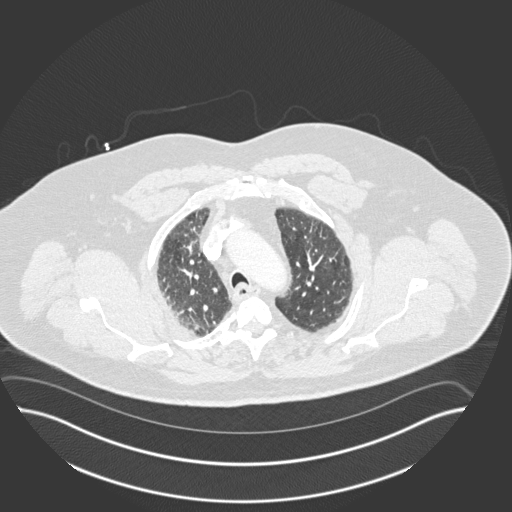
[im 270/324  soft-tissue]
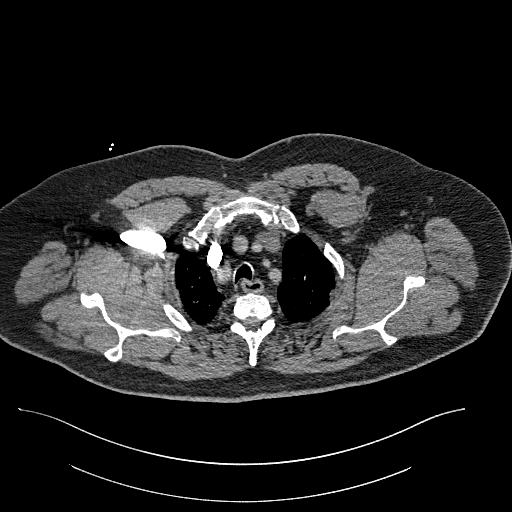
[im 288/324  lung]
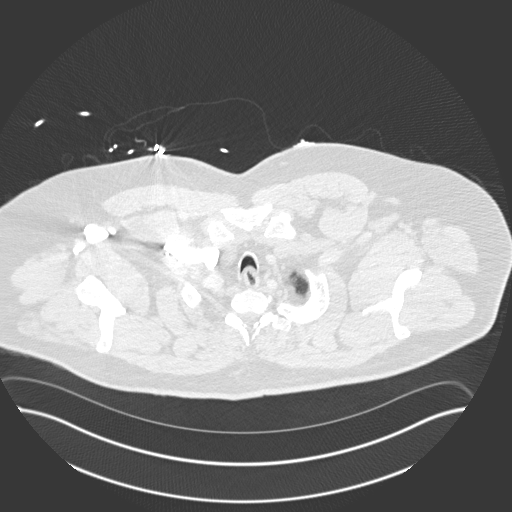
[im 306/324  soft-tissue]
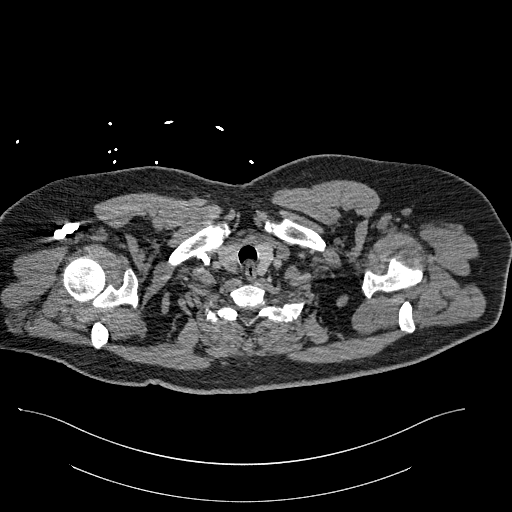

[Series 7: coronal mpr · coronal · 0.63mm/px · 3 of 165 slices shown]
[im 42/165  soft-tissue]
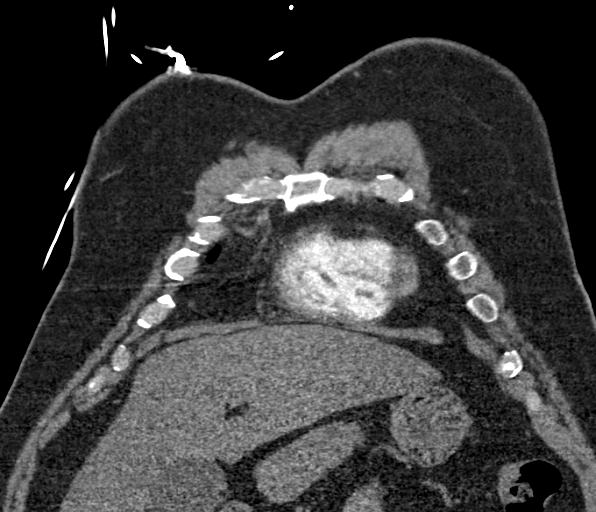
[im 83/165  soft-tissue]
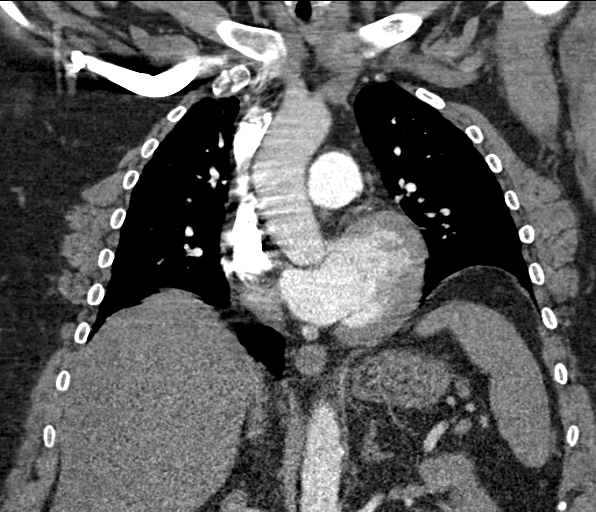
[im 124/165  soft-tissue]
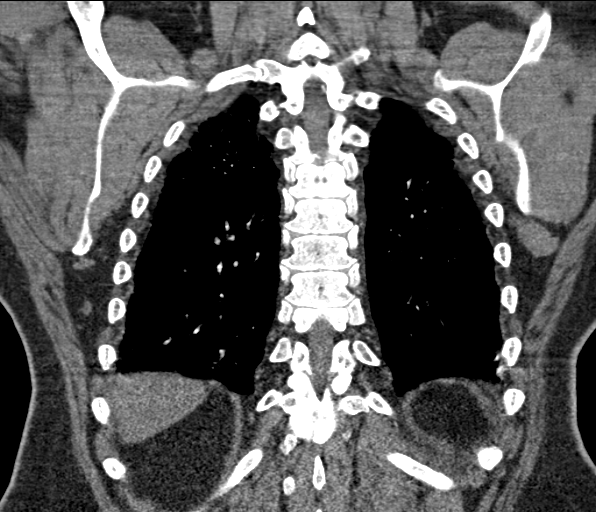

[19 of 46 positions shown; findings below may reference images not displayed]

FINDINGS: No pneumothorax or pleural effusion is noted. No acute pulmonary
disease is noted. There is no evidence of pulmonary embolus. There
is no evidence of thoracic aortic dissection or aneurysm. Visualized
portion of upper abdomen appears normal. No mediastinal mass or
adenopathy is noted. No significant osseous abnormality is noted.

Review of the MIP images confirms the above findings.
IMPRESSION: No evidence of pulmonary embolus. No significant abnormality seen in
the chest.

## 2016-10-13 DIAGNOSIS — I10 Essential (primary) hypertension: Secondary | ICD-10-CM | POA: Insufficient documentation

## 2016-11-14 DIAGNOSIS — C689 Malignant neoplasm of urinary organ, unspecified: Secondary | ICD-10-CM | POA: Insufficient documentation

## 2017-01-04 ENCOUNTER — Ambulatory Visit (INDEPENDENT_AMBULATORY_CARE_PROVIDER_SITE_OTHER): Payer: Medicare Other

## 2017-01-04 ENCOUNTER — Encounter (INDEPENDENT_AMBULATORY_CARE_PROVIDER_SITE_OTHER): Payer: Self-pay

## 2017-01-04 ENCOUNTER — Ambulatory Visit (INDEPENDENT_AMBULATORY_CARE_PROVIDER_SITE_OTHER): Payer: Medicare Other | Admitting: Orthopaedic Surgery

## 2017-01-04 DIAGNOSIS — M25552 Pain in left hip: Secondary | ICD-10-CM

## 2017-01-04 DIAGNOSIS — M25551 Pain in right hip: Secondary | ICD-10-CM

## 2017-01-04 DIAGNOSIS — M1712 Unilateral primary osteoarthritis, left knee: Secondary | ICD-10-CM

## 2017-01-04 NOTE — Progress Notes (Signed)
Office Visit Note   Patient: Ricky Johnston           Date of Birth: [birth year]           MRN: [id number] Visit Date: 01/04/2017              Requested by: No referring provider defined for this encounter. PCP: No PCP Per Patient   Assessment & Plan: Visit Diagnoses:  1. Pain in left hip   2. Pain in right hip   3. Unilateral primary osteoarthritis, left knee     Plan: At this point we are recommending a hip replacement surgery. Certainly his risks of acute blood loss anemia, nerve and vessel injury, fracture, infection, and DVT are heightened due to his obesity and his comorbidities. His hemoglobin A1c is 8. [facility] and I've given him a note to give the cardiologist for clearance for the surgery. I showed him his x-rays and a hip model and explained in detail the disease process is. We had a thorough discussion of what surgery will involve. At this point do the failure of all forms conservative treatment I do feel this is reasonable surgery for him. Our goals are decreased pain, improve mobility, and overall poor quality of life. His pain is 10 out of 10 and again it is detrimental effect his activity is daily living, his quality of life and his mobility to the point he does wish proceed with the surgery. As soon as we get clearance we will work on getting this scheduled.  Follow-Up Instructions: Return for 2 weeks post-op.   Orders:  Orders Placed This Encounter  Procedures  . XR HIPS BILAT W OR W/O PELVIS 3-4 VIEWS   No orders of the defined types were placed in this encounter.     Procedures: No procedures performed   Clinical Data: No additional findings.   Subjective: No chief complaint on file. Patient is very pleasant 66 year old gentleman with diabetes and COPD and lives with a rolling walker. He is a patient is also with the [facility]. He has seen a cardiologist in town and had a cardiac workup fourth  catheterization and negative stress test. This is been 2 years ago though. He's had worsening hip pain for over a year now he's had a interarticular steroid injection in his right hip but only helped minimally. His right hip is bothersome but not like the left hip. Pains in the groin. His detrimentally affecting his activity is daily living, his quality of life, and his mobility.  HPI  Review of Systems He currently denies any chest pain but has had some shortness of breath with exertion but this is not new to him. He does have COPD. He denies any headache, fever, chills, nausea, vomiting.  Objective: Vital Signs: There were no vitals taken for this visit.  Physical Exam He is alert and oriented 3 in no acute distress. He is significantly obese. Ortho Exam I had him lay supine on the exam table. His left leg is just slightly shorter than the right side. He has pain with any attempts of interaction rotation of the right hip of the left hip moves more fluidly. He has significant truncal obesity but I could mobilize his abdomen to get to Specialty Comments:  No specialty comments available.  Imaging: Xr Hips Bilat W Or W/o Pelvis 3-4 Views  Result Date: 01/04/2017 An AP pelvis and a lateral his hips  show well-maintained joint space on the right hip. His left hip has significant loss of joint space. Is very narrow. There is significant sclerotic changes in the femoral head and acetabulum in particular Ross lights as well. This basically end-stage arthritis of the    PMFS History: Patient Active Problem List   Diagnosis Date Noted  . Pain in the chest   . Chronic systolic heart failure ([place])   . Esophageal reflux   . Chest pain 01/08/2015  . Insulin dependent diabetes mellitus ([place]) 01/08/2015  . COPD (chronic obstructive pulmonary disease) ([place]) 01/08/2015  . Hyperlipidemia associated with type 2 diabetes mellitus ([place]) 01/08/2015   Past Medical History:  Diagnosis Date  . Arthritis    . Cancer    bladder cancer currently 2016  . COPD (chronic obstructive pulmonary disease)   . Diabetes mellitus without complication   . Hypertension   . Kidney stones     No family history on file.  Past Surgical History:  Procedure Laterality Date  . ABDOMINAL SURGERY    . BACK SURGERY    . BLADDER REPAIR    . EYE SURGERY    . HERNIA REPAIR    . LEFT HEART CATHETERIZATION WITH CORONARY ANGIOGRAM N/A 01/09/2015   Procedure: LEFT HEART CATHETERIZATION WITH CORONARY ANGIOGRAM;  Surgeon: Clent Demark, MD;  Location: [facility] CATH LAB;  Service: Cardiovascular;  Laterality: N/A;   Social History   Occupational History  . Not on file.   Social History Main Topics  . Smoking status: Former Research scientist (life sciences)  . Smokeless tobacco: Not on file  . Alcohol use Yes  . Drug use: Yes  . Sexual activity: Not on file

## 2017-01-06 ENCOUNTER — Telehealth (INDEPENDENT_AMBULATORY_CARE_PROVIDER_SITE_OTHER): Payer: Self-pay | Admitting: Orthopaedic Surgery

## 2017-01-23 NOTE — Progress Notes (Signed)
Please place orders in EPIC as patient is being scheduled for a Pre-op appointment! Thank you! 

## 2017-01-30 ENCOUNTER — Other Ambulatory Visit (INDEPENDENT_AMBULATORY_CARE_PROVIDER_SITE_OTHER): Payer: Self-pay | Admitting: Physician Assistant

## 2017-02-01 ENCOUNTER — Encounter (HOSPITAL_COMMUNITY): Payer: Self-pay

## 2017-02-02 ENCOUNTER — Encounter (HOSPITAL_COMMUNITY)
Admission: RE | Admit: 2017-02-02 | Discharge: 2017-02-02 | Disposition: A | Discharge status: Home / Self Care | Payer: Medicare Other | Source: Ambulatory Visit | Attending: Orthopaedic Surgery | Admitting: Orthopaedic Surgery

## 2017-02-02 ENCOUNTER — Encounter (HOSPITAL_COMMUNITY): Payer: Self-pay

## 2017-02-02 DIAGNOSIS — Z01818 Encounter for other preprocedural examination: Secondary | ICD-10-CM | POA: Diagnosis present

## 2017-02-02 DIAGNOSIS — E119 Type 2 diabetes mellitus without complications: Secondary | ICD-10-CM | POA: Insufficient documentation

## 2017-02-02 DIAGNOSIS — M162 Bilateral osteoarthritis resulting from hip dysplasia: Secondary | ICD-10-CM | POA: Diagnosis not present

## 2017-02-02 DIAGNOSIS — I10 Essential (primary) hypertension: Secondary | ICD-10-CM | POA: Insufficient documentation

## 2017-02-02 HISTORY — DX: Other deformities of toe(s) (acquired), unspecified foot: M20.5X9

## 2017-02-02 HISTORY — DX: Adverse effect of unspecified anesthetic, initial encounter: T41.45XA

## 2017-02-02 HISTORY — DX: Tinnitus, unspecified ear: H93.19

## 2017-02-02 HISTORY — DX: Unspecified hearing loss, unspecified ear: H91.90

## 2017-02-02 HISTORY — DX: Other complications of anesthesia, initial encounter: T88.59XA

## 2017-02-02 HISTORY — DX: Gastro-esophageal reflux disease without esophagitis: K21.9

## 2017-02-02 HISTORY — DX: Pulmonary fibrosis, unspecified: J84.10

## 2017-02-02 HISTORY — DX: Sleep apnea, unspecified: G47.30

## 2017-02-02 HISTORY — DX: Restless legs syndrome: G25.81

## 2017-02-02 HISTORY — DX: Personal history of urinary calculi: Z87.442

## 2017-02-02 HISTORY — DX: Calculus of kidney: N20.0

## 2017-02-02 HISTORY — DX: Headache, unspecified: R51.9

## 2017-02-02 HISTORY — DX: Sciatica, unspecified side: M54.30

## 2017-02-02 HISTORY — DX: Unspecified thoracic, thoracolumbar and lumbosacral intervertebral disc disorder: M51.9

## 2017-02-02 HISTORY — DX: Acute embolism and thrombosis of unspecified deep veins of unspecified lower extremity: I82.409

## 2017-02-02 HISTORY — DX: Pure hypercholesterolemia, unspecified: E78.00

## 2017-02-02 HISTORY — DX: Headache: R51

## 2017-02-02 HISTORY — DX: Reserved for inherently not codable concepts without codable children: IMO0001

## 2017-02-02 LAB — BASIC METABOLIC PANEL
ANION GAP: 8 (ref 5–15)
BUN: 32 mg/dL — ABNORMAL HIGH (ref 6–20)
CO2: 28 mmol/L (ref 22–32)
Calcium: 9.3 mg/dL (ref 8.9–10.3)
Chloride: 103 mmol/L (ref 101–111)
Creatinine, Ser: 1.6 mg/dL — ABNORMAL HIGH (ref 0.61–1.24)
GFR calc Af Amer: 51 mL/min — ABNORMAL LOW (ref >60)
GFR, EST NON AFRICAN AMERICAN: 44 mL/min — AB (ref >60)
Glucose, Bld: 88 mg/dL (ref 65–99)
POTASSIUM: 4.7 mmol/L (ref 3.5–5.1)
SODIUM: 139 mmol/L (ref 135–145)

## 2017-02-02 LAB — CBC
HEMATOCRIT: 45.9 % (ref 39.0–52.0)
HEMOGLOBIN: 14.5 g/dL (ref 13.0–17.0)
MCH: 29.1 pg (ref 26.0–34.0)
MCHC: 31.6 g/dL (ref 30.0–36.0)
MCV: 92.2 fL (ref 78.0–100.0)
Platelets: 160 10*3/uL (ref 150–400)
RBC: 4.98 MIL/uL (ref 4.22–5.81)
RDW: 15.2 % (ref 11.5–15.5)
WBC: 5.9 10*3/uL (ref 4.0–10.5)

## 2017-02-02 LAB — GLUCOSE, CAPILLARY: GLUCOSE-CAPILLARY: 93 mg/dL (ref 65–99)

## 2017-02-02 LAB — SURGICAL PCR SCREEN
MRSA, PCR: NEGATIVE
STAPHYLOCOCCUS AUREUS: NEGATIVE

## 2017-02-02 LAB — ABO/RH: ABO/RH(D): O POS

## 2017-02-02 NOTE — Patient Instructions (Addendum)
Ricky Johnston  02/02/2017   Your procedure is scheduled on: Friday 02/10/2017  Report to [facility] Main  Entrance take [place]  elevators to 3rd floor to  [place] at  0730  AM.  Call this number if you have problems the morning of surgery [phone]   Remember: ONLY 1 PERSON MAY GO WITH YOU TO SHORT STAY TO GET  READY MORNING OF [place].   Do not eat food or drink liquids :After Midnight.  How to Manage Your Diabetes Before and After Surgery  Why is it important to control my blood sugar before and after surgery? . Improving blood sugar levels before and after surgery helps healing and can limit problems. . A way of improving blood sugar control is eating a healthy diet by: o  Eating less sugar and carbohydrates o  Increasing activity/exercise o  Talking with your doctor about reaching your blood sugar goals . High blood sugars (greater than 180 mg/dL) can raise your risk of infections and slow your recovery, so you will need to focus on controlling your diabetes during the weeks before surgery. . Make sure that the doctor who takes care of your diabetes knows about your planned surgery including the date and location.  How do I manage my blood sugar before surgery? . Check your blood sugar at least 4 times a day, starting 2 days before surgery, to make sure that the level is not too high or low. o Check your blood sugar the morning of your surgery when you wake up and every 2 hours until you get to the Short Stay unit. . If your blood sugar is less than 70 mg/dL, you will need to treat for low blood sugar: o Do not take insulin. o Treat a low blood sugar (less than 70 mg/dL) with  cup of clear juice (cranberry or apple), 4 glucose tablets, OR glucose gel. o Recheck blood sugar in 15 minutes after treatment (to make sure it is greater than 70 mg/dL). If your blood sugar is not greater than 70 mg/dL on recheck, call [phone] for further  instructions. . Report your blood sugar to the short stay nurse when you get to Short Stay.  . If you are admitted to the hospital after surgery: o Your blood sugar will be checked by the staff and you will probably be given insulin after surgery (instead of oral diabetes medicines) to make sure you have good blood sugar levels. o The goal for blood sugar control after surgery is 80-180 mg/dL.   WHAT DO I DO ABOUT MY DIABETES MEDICATION?  Marland Kitchen Do not take oral diabetes medicines (pills) the morning of surgery.        THE DAY BEFORE SURGERY-  METFORMIN (GLUCOPHAGE), take usual dose in the morning and  at evening.         THE DAY BEFORE SURGERY, NOVOLOG INSULIN,Take usual dose before meals but NO evening dose.  . THE NIGHT BEFORE SURGERY, take   22    units of      Lantus   insulin.       . THE MORNING OF SURGERY, take 22   units of      Lantus   insulin.  . The day of surgery, do not take any Novolog Insulin unless-  . If your CBG is greater than 220 mg/dL, you may take  of your sliding scale  . (  correction) dose of insulin.    Take these medicines the morning of surgery with A SIP OF WATER: omeprazole (prilosec), Gabapentin (Neurontin), use Spiriva inhaler, use Albuterol inhaler if needed, use Symbicort inhaler if needed                                  You may not have any metal on your body including hair pins and              piercings  Do not wear jewelry, make-up, lotions, powders or perfumes, deodorant             Do not wear nail polish.  Do not shave  48 hours prior to surgery.              Men may shave face and neck.   Do not bring valuables to the hospital. [place].  Contacts, dentures or bridgework may not be worn into surgery.  Leave suitcase in the car. After surgery it may be brought to your room.                 Please read over the following fact sheets you were  given: _____________________________________________________________________             [facility] - Preparing for Surgery Before surgery, you can play an important role.  Because skin is not sterile, your skin needs to be as free of germs as possible.  You can reduce the number of germs on your skin by washing with CHG (chlorahexidine gluconate) soap before surgery.  CHG is an antiseptic cleaner which kills germs and bonds with the skin to continue killing germs even after washing. Please DO NOT use if you have an allergy to CHG or antibacterial soaps.  If your skin becomes reddened/irritated stop using the CHG and inform your nurse when you arrive at Short Stay. Do not shave (including legs and underarms) for at least 48 hours prior to the first CHG shower.  You may shave your face/neck. Please follow these instructions carefully:  1.  Shower with CHG Soap the night before surgery and the  morning of Surgery.  2.  If you choose to wash your hair, wash your hair first as usual with your  normal  shampoo.  3.  After you shampoo, rinse your hair and body thoroughly to remove the  shampoo.                           4.  Use CHG as you would any other liquid soap.  You can apply chg directly  to the skin and wash                       Gently with a scrungie or clean washcloth.  5.  Apply the CHG Soap to your body ONLY FROM THE NECK DOWN.   Do not use on face/ open                           Wound or open sores. Avoid contact with eyes, ears mouth and genitals (private parts).  Wash face,  Genitals (private parts) with your normal soap.             6.  Wash thoroughly, paying special attention to the area where your surgery  will be performed.  7.  Thoroughly rinse your body with warm water from the neck down.  8.  DO NOT shower/wash with your normal soap after using and rinsing off  the CHG Soap.                9.  Pat yourself dry with a clean towel.            10.  Wear  clean pajamas.            11.  Place clean sheets on your bed the night of your first shower and do not  sleep with pets. Day of Surgery : Do not apply any lotions/deodorants the morning of surgery.  Please wear clean clothes to the hospital/surgery center.  FAILURE TO FOLLOW THESE INSTRUCTIONS MAY RESULT IN THE CANCELLATION OF YOUR SURGERY PATIENT SIGNATURE_________________________________  NURSE SIGNATURE__________________________________  ________________________________________________________________________   Adam Phenix  An incentive spirometer is a tool that can help keep your lungs clear and active. This tool measures how well you are filling your lungs with each breath. Taking long deep breaths may help reverse or decrease the chance of developing breathing (pulmonary) problems (especially infection) following:  A long period of time when you are unable to move or be active. BEFORE THE PROCEDURE   If the spirometer includes an indicator to show your best effort, your nurse or respiratory therapist will set it to a desired goal.  If possible, sit up straight or lean slightly forward. Try not to slouch.  Hold the incentive spirometer in an upright position. INSTRUCTIONS FOR USE  1. Sit on the edge of your bed if possible, or sit up as far as you can in bed or on a chair. 2. Hold the incentive spirometer in an upright position. 3. Breathe out normally. 4. Place the mouthpiece in your mouth and seal your lips tightly around it. 5. Breathe in slowly and as deeply as possible, raising the piston or the ball toward the top of the column. 6. Hold your breath for 3-5 seconds or for as long as possible. Allow the piston or ball to fall to the bottom of the column. 7. Remove the mouthpiece from your mouth and breathe out normally. 8. Rest for a few seconds and repeat Steps 1 through 7 at least 10 times every 1-2 hours when you are awake. Take your time and take a few normal  breaths between deep breaths. 9. The spirometer may include an indicator to show your best effort. Use the indicator as a goal to work toward during each repetition. 10. After each set of 10 deep breaths, practice coughing to be sure your lungs are clear. If you have an incision (the cut made at the time of surgery), support your incision when coughing by placing a pillow or rolled up towels firmly against it. Once you are able to get out of bed, walk around indoors and cough well. You may stop using the incentive spirometer when instructed by your caregiver.  RISKS AND COMPLICATIONS  Take your time so you do not get dizzy or light-headed.  If you are in pain, you may need to take or ask for pain medication before doing incentive spirometry. It is harder to take a deep breath if you are having  pain. AFTER USE  Rest and breathe slowly and easily.  It can be helpful to keep track of a log of your progress. Your caregiver can provide you with a simple table to help with this. If you are using the spirometer at home, follow these instructions: [place] IF:   You are having difficultly using the spirometer.  You have trouble using the spirometer as often as instructed.  Your pain medication is not giving enough relief while using the spirometer.  You develop fever of 100.5 F (38.1 C) or higher. SEEK IMMEDIATE MEDICAL CARE IF:   You cough up bloody sputum that had not been present before.  You develop fever of 102 F (38.9 C) or greater.  You develop worsening pain at or near the incision site. MAKE SURE YOU:   Understand these instructions.  Will watch your condition.  Will get help right away if you are not doing well or get worse. Document Released: 03/27/2007 Document Revised: 02/06/2012 Document Reviewed: 05/28/2007 Exit[employer] Patient Information 2014 [employer], [place].   ________________________________________________________________________  WHAT IS A BLOOD  TRANSFUSION? Blood Transfusion Information  A transfusion is the replacement of blood or some of its parts. Blood is made up of multiple cells which provide different functions.  Red blood cells carry oxygen and are used for blood loss replacement.  White blood cells fight against infection.  Platelets control bleeding.  Plasma helps clot blood.  Other blood products are available for specialized needs, such as hemophilia or other clotting disorders. BEFORE THE TRANSFUSION  Who gives blood for transfusions?   Healthy volunteers who are fully evaluated to make sure their blood is safe. This is blood bank blood. Transfusion therapy is the safest it has ever been in the practice of medicine. Before blood is taken from a donor, a complete history is taken to make sure that person has no history of diseases nor engages in risky social behavior (examples are intravenous drug use or sexual activity with multiple partners). The donor's travel history is screened to minimize risk of transmitting infections, such as malaria. The donated blood is tested for signs of infectious diseases, such as HIV and hepatitis. The blood is then tested to be sure it is compatible with you in order to minimize the chance of a transfusion reaction. If you or a relative donates blood, this is often done in anticipation of surgery and is not appropriate for emergency situations. It takes many days to process the donated blood. RISKS AND COMPLICATIONS Although transfusion therapy is very safe and saves many lives, the main dangers of transfusion include:   Getting an infectious disease.  Developing a transfusion reaction. This is an allergic reaction to something in the blood you were given. Every precaution is taken to prevent this. The decision to have a blood transfusion has been considered carefully by your caregiver before blood is given. Blood is not given unless the benefits outweigh the risks. AFTER THE  TRANSFUSION  Right after receiving a blood transfusion, you will usually feel much better and more energetic. This is especially true if your red blood cells have gotten low (anemic). The transfusion raises the level of the red blood cells which carry oxygen, and this usually causes an energy increase.  The nurse administering the transfusion will monitor you carefully for complications. HOME CARE INSTRUCTIONS  No special instructions are needed after a transfusion. You may find your energy is better. Speak with your caregiver about any limitations on activity for underlying  diseases you may have. SEEK MEDICAL CARE IF:   Your condition is not improving after your transfusion.  You develop redness or irritation at the intravenous (IV) site. SEEK IMMEDIATE MEDICAL CARE IF:  Any of the following symptoms occur over the next 12 hours:  Shaking chills.  You have a temperature by mouth above 102 F (38.9 C), not controlled by medicine.  Chest, back, or muscle pain.  People around you feel you are not acting correctly or are confused.  Shortness of breath or difficulty breathing.  Dizziness and fainting.  You get a rash or develop hives.  You have a decrease in urine output.  Your urine turns a dark color or changes to pink, red, or brown. Any of the following symptoms occur over the next 10 days:  You have a temperature by mouth above 102 F (38.9 C), not controlled by medicine.  Shortness of breath.  Weakness after normal activity.  The white part of the eye turns yellow (jaundice).  You have a decrease in the amount of urine or are urinating less often.  Your urine turns a dark color or changes to pink, red, or brown. Document Released: 11/11/2000 Document Revised: 02/06/2012 Document Reviewed: 06/30/2008 [facility] Patient Information 2014 [place], [place].  _______________________________________________________________________

## 2017-02-02 NOTE — Progress Notes (Signed)
Consulted Dr. Nolon Nations , Anesthesia to see if he wanted to see patient based on patient's medical history and Anesthesia will assess patient day of surgery.

## 2017-02-02 NOTE — Progress Notes (Signed)
01/11/2017- Pre-operative clearance from Dr. Naaman Plummer on chart 01/10/2017- HGA1C result from [facility] on chart, with [place] 12/15/2016-Lung cancer screening from [facility]  on chart along with other office notes on chart from [place]

## 2017-02-09 MED ORDER — DEXTROSE 5 % IV SOLN
3.0000 g | INTRAVENOUS | Status: AC
  Administered 2017-02-10: 3 g via INTRAVENOUS
  Filled 2017-02-09: qty 3

## 2017-02-10 ENCOUNTER — Inpatient Hospital Stay (HOSPITAL_COMMUNITY): Payer: Medicare Other

## 2017-02-10 ENCOUNTER — Encounter (HOSPITAL_COMMUNITY): Payer: Self-pay | Admitting: *Deleted

## 2017-02-10 ENCOUNTER — Inpatient Hospital Stay (HOSPITAL_COMMUNITY)
Admission: RE | Admit: 2017-02-10 | Discharge: 2017-02-14 | DRG: 469 | Disposition: A | Discharge status: Skilled Nursing Facility | Payer: Medicare Other | Source: Ambulatory Visit | Attending: Orthopaedic Surgery | Admitting: Orthopaedic Surgery

## 2017-02-10 ENCOUNTER — Encounter (HOSPITAL_COMMUNITY)
Admission: RE | Disposition: A | Discharge status: Skilled Nursing Facility | Payer: Self-pay | Source: Ambulatory Visit | Attending: Orthopaedic Surgery

## 2017-02-10 ENCOUNTER — Inpatient Hospital Stay (HOSPITAL_COMMUNITY): Payer: Medicare Other | Admitting: Anesthesiology

## 2017-02-10 DIAGNOSIS — Z7982 Long term (current) use of aspirin: Secondary | ICD-10-CM

## 2017-02-10 DIAGNOSIS — J9602 Acute respiratory failure with hypercapnia: Secondary | ICD-10-CM

## 2017-02-10 DIAGNOSIS — N189 Chronic kidney disease, unspecified: Secondary | ICD-10-CM | POA: Diagnosis present

## 2017-02-10 DIAGNOSIS — Z905 Acquired absence of kidney: Secondary | ICD-10-CM | POA: Diagnosis not present

## 2017-02-10 DIAGNOSIS — I130 Hypertensive heart and chronic kidney disease with heart failure and stage 1 through stage 4 chronic kidney disease, or unspecified chronic kidney disease: Secondary | ICD-10-CM | POA: Diagnosis not present

## 2017-02-10 DIAGNOSIS — H9190 Unspecified hearing loss, unspecified ear: Secondary | ICD-10-CM | POA: Diagnosis present

## 2017-02-10 DIAGNOSIS — J9690 Respiratory failure, unspecified, unspecified whether with hypoxia or hypercapnia: Secondary | ICD-10-CM

## 2017-02-10 DIAGNOSIS — Z6841 Body Mass Index (BMI) 40.0 and over, adult: Secondary | ICD-10-CM

## 2017-02-10 DIAGNOSIS — G2581 Restless legs syndrome: Secondary | ICD-10-CM | POA: Diagnosis present

## 2017-02-10 DIAGNOSIS — Z8551 Personal history of malignant neoplasm of bladder: Secondary | ICD-10-CM

## 2017-02-10 DIAGNOSIS — Z7984 Long term (current) use of oral hypoglycemic drugs: Secondary | ICD-10-CM | POA: Diagnosis not present

## 2017-02-10 DIAGNOSIS — E662 Morbid (severe) obesity with alveolar hypoventilation: Secondary | ICD-10-CM | POA: Diagnosis present

## 2017-02-10 DIAGNOSIS — J439 Emphysema, unspecified: Secondary | ICD-10-CM | POA: Diagnosis not present

## 2017-02-10 DIAGNOSIS — E785 Hyperlipidemia, unspecified: Secondary | ICD-10-CM | POA: Diagnosis present

## 2017-02-10 DIAGNOSIS — E1122 Type 2 diabetes mellitus with diabetic chronic kidney disease: Secondary | ICD-10-CM | POA: Diagnosis present

## 2017-02-10 DIAGNOSIS — I5022 Chronic systolic (congestive) heart failure: Secondary | ICD-10-CM | POA: Diagnosis present

## 2017-02-10 DIAGNOSIS — Z96649 Presence of unspecified artificial hip joint: Secondary | ICD-10-CM

## 2017-02-10 DIAGNOSIS — K219 Gastro-esophageal reflux disease without esophagitis: Secondary | ICD-10-CM | POA: Diagnosis present

## 2017-02-10 DIAGNOSIS — E7800 Pure hypercholesterolemia, unspecified: Secondary | ICD-10-CM | POA: Diagnosis present

## 2017-02-10 DIAGNOSIS — E1165 Type 2 diabetes mellitus with hyperglycemia: Secondary | ICD-10-CM | POA: Diagnosis not present

## 2017-02-10 DIAGNOSIS — J441 Chronic obstructive pulmonary disease with (acute) exacerbation: Secondary | ICD-10-CM | POA: Diagnosis not present

## 2017-02-10 DIAGNOSIS — Z794 Long term (current) use of insulin: Secondary | ICD-10-CM | POA: Diagnosis not present

## 2017-02-10 DIAGNOSIS — J9622 Acute and chronic respiratory failure with hypercapnia: Secondary | ICD-10-CM | POA: Diagnosis not present

## 2017-02-10 DIAGNOSIS — N179 Acute kidney failure, unspecified: Secondary | ICD-10-CM | POA: Diagnosis not present

## 2017-02-10 DIAGNOSIS — D509 Iron deficiency anemia, unspecified: Secondary | ICD-10-CM | POA: Diagnosis present

## 2017-02-10 DIAGNOSIS — Z87442 Personal history of urinary calculi: Secondary | ICD-10-CM

## 2017-02-10 DIAGNOSIS — G4733 Obstructive sleep apnea (adult) (pediatric): Secondary | ICD-10-CM | POA: Diagnosis not present

## 2017-02-10 DIAGNOSIS — I110 Hypertensive heart disease with heart failure: Secondary | ICD-10-CM | POA: Diagnosis not present

## 2017-02-10 DIAGNOSIS — Z419 Encounter for procedure for purposes other than remedying health state, unspecified: Secondary | ICD-10-CM

## 2017-02-10 DIAGNOSIS — R739 Hyperglycemia, unspecified: Secondary | ICD-10-CM | POA: Diagnosis not present

## 2017-02-10 DIAGNOSIS — D62 Acute posthemorrhagic anemia: Secondary | ICD-10-CM | POA: Diagnosis not present

## 2017-02-10 DIAGNOSIS — Z96642 Presence of left artificial hip joint: Secondary | ICD-10-CM

## 2017-02-10 DIAGNOSIS — M1612 Unilateral primary osteoarthritis, left hip: Secondary | ICD-10-CM | POA: Diagnosis not present

## 2017-02-10 DIAGNOSIS — Z87891 Personal history of nicotine dependence: Secondary | ICD-10-CM

## 2017-02-10 DIAGNOSIS — M25552 Pain in left hip: Secondary | ICD-10-CM | POA: Diagnosis present

## 2017-02-10 DIAGNOSIS — G9340 Encephalopathy, unspecified: Secondary | ICD-10-CM | POA: Diagnosis not present

## 2017-02-10 DIAGNOSIS — Z9981 Dependence on supplemental oxygen: Secondary | ICD-10-CM

## 2017-02-10 DIAGNOSIS — J8410 Pulmonary fibrosis, unspecified: Secondary | ICD-10-CM | POA: Diagnosis present

## 2017-02-10 HISTORY — PX: TOTAL HIP ARTHROPLASTY: SHX124

## 2017-02-10 LAB — BLOOD GAS, ARTERIAL
ACID-BASE DEFICIT: 0.8 mmol/L (ref 0.0–2.0)
Acid-base deficit: 0.7 mmol/L (ref 0.0–2.0)
BICARBONATE: 28.6 mmol/L — AB (ref 20.0–28.0)
Bicarbonate: 29.8 mmol/L — ABNORMAL HIGH (ref 20.0–28.0)
DRAWN BY: 270211
Delivery systems: POSITIVE
Drawn by: 270211
EXPIRATORY PAP: 5
FIO2: 0.35
Inspiratory PAP: 18
O2 Content: 10 L/min
O2 SAT: 98.5 %
O2 Saturation: 93.5 %
PATIENT TEMPERATURE: 37
Patient temperature: 37
pCO2 arterial: 82.8 mmHg (ref 32.0–48.0)
pCO2 arterial: 73.1 mmHg (ref 32.0–48.0)
pH, Arterial: 7.181 — CL (ref 7.350–7.450)
pH, Arterial: 7.217 — ABNORMAL LOW (ref 7.350–7.450)
pO2, Arterial: 189 mmHg — ABNORMAL HIGH (ref 83.0–108.0)
pO2, Arterial: 81.7 mmHg — ABNORMAL LOW (ref 83.0–108.0)

## 2017-02-10 LAB — CBC WITH DIFFERENTIAL/PLATELET
BASOS ABS: 0 10*3/uL (ref 0.0–0.1)
Basophils Relative: 0 %
Eosinophils Absolute: 0.1 10*3/uL (ref 0.0–0.7)
Eosinophils Relative: 1 %
HEMATOCRIT: 43.3 % (ref 39.0–52.0)
HEMOGLOBIN: 13.2 g/dL (ref 13.0–17.0)
Lymphocytes Relative: 7 %
Lymphs Abs: 0.6 10*3/uL — ABNORMAL LOW (ref 0.7–4.0)
MCH: 28.9 pg (ref 26.0–34.0)
MCHC: 30.5 g/dL (ref 30.0–36.0)
MCV: 95 fL (ref 78.0–100.0)
Monocytes Absolute: 0.6 10*3/uL (ref 0.1–1.0)
Monocytes Relative: 7 %
NEUTROS ABS: 7.7 10*3/uL (ref 1.7–7.7)
Neutrophils Relative %: 85 %
Platelets: 153 10*3/uL (ref 150–400)
RBC: 4.56 MIL/uL (ref 4.22–5.81)
RDW: 15.9 % — ABNORMAL HIGH (ref 11.5–15.5)
WBC: 9 10*3/uL (ref 4.0–10.5)

## 2017-02-10 LAB — BASIC METABOLIC PANEL
ANION GAP: 6 (ref 5–15)
BUN: 29 mg/dL — ABNORMAL HIGH (ref 6–20)
CHLORIDE: 105 mmol/L (ref 101–111)
CO2: 29 mmol/L (ref 22–32)
Calcium: 8.6 mg/dL — ABNORMAL LOW (ref 8.9–10.3)
Creatinine, Ser: 1.5 mg/dL — ABNORMAL HIGH (ref 0.61–1.24)
GFR calc Af Amer: 55 mL/min — ABNORMAL LOW (ref >60)
GFR calc non Af Amer: 47 mL/min — ABNORMAL LOW (ref >60)
Glucose, Bld: 163 mg/dL — ABNORMAL HIGH (ref 65–99)
Potassium: 5.4 mmol/L — ABNORMAL HIGH (ref 3.5–5.1)
Sodium: 140 mmol/L (ref 135–145)

## 2017-02-10 LAB — GLUCOSE, CAPILLARY
GLUCOSE-CAPILLARY: 130 mg/dL — AB (ref 65–99)
GLUCOSE-CAPILLARY: 157 mg/dL — AB (ref 65–99)
GLUCOSE-CAPILLARY: 192 mg/dL — AB (ref 65–99)
Glucose-Capillary: 117 mg/dL — ABNORMAL HIGH (ref 65–99)
Glucose-Capillary: 161 mg/dL — ABNORMAL HIGH (ref 65–99)

## 2017-02-10 LAB — MAGNESIUM: MAGNESIUM: 2 mg/dL (ref 1.7–2.4)

## 2017-02-10 LAB — TYPE AND SCREEN
ABO/RH(D): O POS
ANTIBODY SCREEN: NEGATIVE

## 2017-02-10 LAB — PHOSPHORUS: PHOSPHORUS: 4.7 mg/dL — AB (ref 2.5–4.6)

## 2017-02-10 SURGERY — ARTHROPLASTY, HIP, TOTAL, ANTERIOR APPROACH
Anesthesia: Monitor Anesthesia Care | Site: Hip | Laterality: Left

## 2017-02-10 MED ORDER — FERROUS SULFATE 325 (65 FE) MG PO TABS
325.0000 mg | ORAL_TABLET | Freq: Every day | ORAL | Status: DC
  Administered 2017-02-11 – 2017-02-14 (×4): 325 mg via ORAL
  Filled 2017-02-10 (×4): qty 1

## 2017-02-10 MED ORDER — MEPERIDINE HCL 50 MG/ML IJ SOLN: 6.2500 mg | INTRAMUSCULAR | Status: DC | PRN

## 2017-02-10 MED ORDER — PROPOFOL 10 MG/ML IV BOLUS
INTRAVENOUS | Status: AC
  Filled 2017-02-10: qty 20

## 2017-02-10 MED ORDER — PHENYLEPHRINE HCL 10 MG/ML IJ SOLN
INTRAMUSCULAR | Status: AC
  Filled 2017-02-10: qty 1

## 2017-02-10 MED ORDER — VITAMIN D3 25 MCG (1000 UNIT) PO TABS
1000.0000 [IU] | ORAL_TABLET | Freq: Every day | ORAL | Status: DC
  Administered 2017-02-11 – 2017-02-14 (×4): 1000 [IU] via ORAL
  Filled 2017-02-10 (×4): qty 1

## 2017-02-10 MED ORDER — ROPINIROLE HCL 0.5 MG PO TABS
0.5000 mg | ORAL_TABLET | Freq: Every day | ORAL | Status: DC
  Administered 2017-02-11 – 2017-02-13 (×3): 0.5 mg via ORAL
  Filled 2017-02-10 (×4): qty 1

## 2017-02-10 MED ORDER — METOCLOPRAMIDE HCL 5 MG PO TABS: 5.0000 mg | ORAL_TABLET | Freq: Three times a day (TID) | ORAL | Status: DC | PRN

## 2017-02-10 MED ORDER — NALOXONE HCL 0.4 MG/ML IJ SOLN
0.2000 mg | INTRAMUSCULAR | Status: DC | PRN
  Administered 2017-02-10: 0.2 mg via INTRAVENOUS

## 2017-02-10 MED ORDER — DEXTROSE 5 % IV SOLN
500.0000 mg | Freq: Four times a day (QID) | INTRAVENOUS | Status: DC | PRN
  Administered 2017-02-10 – 2017-02-11 (×3): 500 mg via INTRAVENOUS
  Filled 2017-02-10 (×3): qty 550
  Filled 2017-02-10 (×3): qty 5

## 2017-02-10 MED ORDER — INSULIN GLARGINE 100 UNIT/ML ~~LOC~~ SOLN
20.0000 [IU] | Freq: Two times a day (BID) | SUBCUTANEOUS | Status: DC
  Administered 2017-02-10 – 2017-02-14 (×9): 20 [IU] via SUBCUTANEOUS
  Filled 2017-02-10 (×9): qty 0.2

## 2017-02-10 MED ORDER — ONDANSETRON HCL 4 MG/2ML IJ SOLN: 4.0000 mg | Freq: Four times a day (QID) | INTRAMUSCULAR | Status: DC | PRN

## 2017-02-10 MED ORDER — PROPOFOL 10 MG/ML IV BOLUS
INTRAVENOUS | Status: DC | PRN
  Administered 2017-02-10 (×2): 20 mg via INTRAVENOUS

## 2017-02-10 MED ORDER — HYDROMORPHONE HCL 1 MG/ML IJ SOLN
0.5000 mg | INTRAMUSCULAR | Status: DC | PRN
  Administered 2017-02-10 – 2017-02-14 (×7): 0.5 mg via INTRAVENOUS
  Filled 2017-02-10 (×9): qty 0.5

## 2017-02-10 MED ORDER — ACETAMINOPHEN 325 MG PO TABS
650.0000 mg | ORAL_TABLET | Freq: Four times a day (QID) | ORAL | Status: DC | PRN
  Administered 2017-02-12 – 2017-02-14 (×2): 650 mg via ORAL
  Filled 2017-02-10 (×2): qty 2

## 2017-02-10 MED ORDER — NALOXONE HCL 0.4 MG/ML IJ SOLN
INTRAMUSCULAR | Status: AC
Start: 2017-02-10 — End: 2017-02-10
  Administered 2017-02-10: 0.4 mg
  Filled 2017-02-10: qty 1

## 2017-02-10 MED ORDER — NALOXONE HCL 0.4 MG/ML IJ SOLN: 0.4000 mg | INTRAMUSCULAR | Status: DC | PRN

## 2017-02-10 MED ORDER — ACETAMINOPHEN 650 MG RE SUPP: 650.0000 mg | Freq: Four times a day (QID) | RECTAL | Status: DC | PRN

## 2017-02-10 MED ORDER — OXYCODONE HCL 5 MG PO TABS
5.0000 mg | ORAL_TABLET | ORAL | Status: DC | PRN
  Administered 2017-02-11 – 2017-02-13 (×6): 10 mg via ORAL
  Administered 2017-02-14 (×2): 5 mg via ORAL
  Filled 2017-02-10 (×2): qty 2
  Filled 2017-02-10: qty 1
  Filled 2017-02-10 (×4): qty 2
  Filled 2017-02-10: qty 1

## 2017-02-10 MED ORDER — PANTOPRAZOLE SODIUM 40 MG PO TBEC: 40.0000 mg | DELAYED_RELEASE_TABLET | Freq: Every day | ORAL | Status: DC

## 2017-02-10 MED ORDER — PHENOL 1.4 % MT LIQD
1.0000 | OROMUCOSAL | Status: DC | PRN
  Filled 2017-02-10: qty 177

## 2017-02-10 MED ORDER — ARFORMOTEROL TARTRATE 15 MCG/2ML IN NEBU
15.0000 ug | INHALATION_SOLUTION | Freq: Two times a day (BID) | RESPIRATORY_TRACT | Status: DC
  Administered 2017-02-10 – 2017-02-14 (×7): 15 ug via RESPIRATORY_TRACT
  Filled 2017-02-10 (×8): qty 2

## 2017-02-10 MED ORDER — PROPOFOL 500 MG/50ML IV EMUL
INTRAVENOUS | Status: DC | PRN
  Administered 2017-02-10: 50 ug/kg/min via INTRAVENOUS

## 2017-02-10 MED ORDER — BUDESONIDE 0.5 MG/2ML IN SUSP
0.5000 mg | Freq: Two times a day (BID) | RESPIRATORY_TRACT | Status: DC
  Administered 2017-02-10 – 2017-02-14 (×7): 0.5 mg via RESPIRATORY_TRACT
  Filled 2017-02-10 (×7): qty 2

## 2017-02-10 MED ORDER — INSULIN GLARGINE 100 UNIT/ML ~~LOC~~ SOLN
50.0000 [IU] | Freq: Two times a day (BID) | SUBCUTANEOUS | Status: DC
  Filled 2017-02-10: qty 0.5

## 2017-02-10 MED ORDER — CHLORHEXIDINE GLUCONATE 4 % EX LIQD: 60.0000 mL | Freq: Once | CUTANEOUS | Status: DC

## 2017-02-10 MED ORDER — NALOXONE HCL 0.4 MG/ML IJ SOLN
INTRAMUSCULAR | Status: AC
  Filled 2017-02-10: qty 1

## 2017-02-10 MED ORDER — TIOTROPIUM BROMIDE MONOHYDRATE 18 MCG IN CAPS: 18.0000 ug | ORAL_CAPSULE | Freq: Every day | RESPIRATORY_TRACT | Status: DC

## 2017-02-10 MED ORDER — SODIUM CHLORIDE 0.9 % IR SOLN
Status: DC | PRN
  Administered 2017-02-10: 1000 mL

## 2017-02-10 MED ORDER — METFORMIN HCL 500 MG PO TABS
1000.0000 mg | ORAL_TABLET | Freq: Two times a day (BID) | ORAL | Status: DC
  Administered 2017-02-11 – 2017-02-14 (×7): 1000 mg via ORAL
  Filled 2017-02-10 (×7): qty 2

## 2017-02-10 MED ORDER — EPHEDRINE 5 MG/ML INJ
INTRAVENOUS | Status: AC
  Filled 2017-02-10: qty 10

## 2017-02-10 MED ORDER — ATORVASTATIN CALCIUM 20 MG PO TABS
80.0000 mg | ORAL_TABLET | Freq: Every day | ORAL | Status: DC
  Administered 2017-02-11 – 2017-02-13 (×3): 80 mg via ORAL
  Filled 2017-02-10: qty 4
  Filled 2017-02-10 (×2): qty 2

## 2017-02-10 MED ORDER — OXYBUTYNIN CHLORIDE 5 MG PO TABS: 5.0000 mg | ORAL_TABLET | Freq: Four times a day (QID) | ORAL | Status: DC | PRN

## 2017-02-10 MED ORDER — EPHEDRINE SULFATE-NACL 50-0.9 MG/10ML-% IV SOSY
PREFILLED_SYRINGE | INTRAVENOUS | Status: DC | PRN
  Administered 2017-02-10: 5 mg via INTRAVENOUS

## 2017-02-10 MED ORDER — MENTHOL 3 MG MT LOZG: 1.0000 | LOZENGE | OROMUCOSAL | Status: DC | PRN

## 2017-02-10 MED ORDER — INSULIN ASPART 100 UNIT/ML ~~LOC~~ SOLN
0.0000 [IU] | SUBCUTANEOUS | Status: DC
Start: 2017-02-10 — End: 2017-02-14
  Administered 2017-02-10: 3 [IU] via SUBCUTANEOUS
  Administered 2017-02-11: 8 [IU] via SUBCUTANEOUS
  Administered 2017-02-11 (×3): 3 [IU] via SUBCUTANEOUS
  Administered 2017-02-11: 5 [IU] via SUBCUTANEOUS
  Administered 2017-02-11 – 2017-02-12 (×2): 3 [IU] via SUBCUTANEOUS
  Administered 2017-02-12: 2 [IU] via SUBCUTANEOUS
  Administered 2017-02-12 (×3): 3 [IU] via SUBCUTANEOUS
  Administered 2017-02-12: 2 [IU] via SUBCUTANEOUS
  Administered 2017-02-13: 5 [IU] via SUBCUTANEOUS
  Administered 2017-02-13 (×3): 2 [IU] via SUBCUTANEOUS
  Administered 2017-02-13: 3 [IU] via SUBCUTANEOUS

## 2017-02-10 MED ORDER — INSULIN ASPART 100 UNIT/ML ~~LOC~~ SOLN: 30.0000 [IU] | Freq: Three times a day (TID) | SUBCUTANEOUS | Status: DC

## 2017-02-10 MED ORDER — HYDROMORPHONE HCL 1 MG/ML IJ SOLN
0.2500 mg | INTRAMUSCULAR | Status: DC | PRN
  Administered 2017-02-10: 0.5 mg via INTRAVENOUS
  Administered 2017-02-10: 0.25 mg via INTRAVENOUS

## 2017-02-10 MED ORDER — FUROSEMIDE 40 MG PO TABS
40.0000 mg | ORAL_TABLET | Freq: Every day | ORAL | Status: DC
  Administered 2017-02-11 – 2017-02-14 (×4): 40 mg via ORAL
  Filled 2017-02-10 (×4): qty 1

## 2017-02-10 MED ORDER — HYDROMORPHONE HCL 1 MG/ML IJ SOLN: 1.0000 mg | INTRAMUSCULAR | Status: DC | PRN

## 2017-02-10 MED ORDER — FENTANYL CITRATE (PF) 100 MCG/2ML IJ SOLN
INTRAMUSCULAR | Status: AC
  Filled 2017-02-10: qty 2

## 2017-02-10 MED ORDER — HYDROMORPHONE HCL 2 MG/ML IJ SOLN
INTRAMUSCULAR | Status: AC
  Administered 2017-02-10: 0.5 mg
  Filled 2017-02-10: qty 1

## 2017-02-10 MED ORDER — PROPOFOL 10 MG/ML IV BOLUS
INTRAVENOUS | Status: AC
  Filled 2017-02-10: qty 60

## 2017-02-10 MED ORDER — CEFAZOLIN SODIUM-DEXTROSE 2-4 GM/100ML-% IV SOLN
2.0000 g | Freq: Four times a day (QID) | INTRAVENOUS | Status: AC
Start: 2017-02-10 — End: 2017-02-11
  Administered 2017-02-10 – 2017-02-11 (×2): 2 g via INTRAVENOUS
  Filled 2017-02-10 (×3): qty 100

## 2017-02-10 MED ORDER — TAMSULOSIN HCL 0.4 MG PO CAPS
0.4000 mg | ORAL_CAPSULE | Freq: Every day | ORAL | Status: DC
  Administered 2017-02-11 – 2017-02-14 (×4): 0.4 mg via ORAL
  Filled 2017-02-10 (×4): qty 1

## 2017-02-10 MED ORDER — DOCUSATE SODIUM 100 MG PO CAPS
100.0000 mg | ORAL_CAPSULE | Freq: Two times a day (BID) | ORAL | Status: DC
  Administered 2017-02-11 – 2017-02-14 (×7): 100 mg via ORAL
  Filled 2017-02-10 (×7): qty 1

## 2017-02-10 MED ORDER — ASPIRIN 81 MG PO CHEW
81.0000 mg | CHEWABLE_TABLET | Freq: Two times a day (BID) | ORAL | Status: DC
  Administered 2017-02-11 – 2017-02-14 (×7): 81 mg via ORAL
  Filled 2017-02-10 (×7): qty 1

## 2017-02-10 MED ORDER — METHOCARBAMOL 500 MG PO TABS
500.0000 mg | ORAL_TABLET | Freq: Four times a day (QID) | ORAL | Status: DC | PRN
  Administered 2017-02-12 – 2017-02-14 (×3): 500 mg via ORAL
  Filled 2017-02-10 (×3): qty 1

## 2017-02-10 MED ORDER — LACTATED RINGERS IV SOLN
INTRAVENOUS | Status: DC
  Administered 2017-02-10 (×2): via INTRAVENOUS

## 2017-02-10 MED ORDER — ONDANSETRON HCL 4 MG PO TABS: 4.0000 mg | ORAL_TABLET | Freq: Four times a day (QID) | ORAL | Status: DC | PRN

## 2017-02-10 MED ORDER — PHENYLEPHRINE HCL 10 MG/ML IJ SOLN
INTRAVENOUS | Status: DC | PRN
  Administered 2017-02-10: 40 ug/min via INTRAVENOUS

## 2017-02-10 MED ORDER — METOCLOPRAMIDE HCL 5 MG/ML IJ SOLN: 5.0000 mg | Freq: Three times a day (TID) | INTRAMUSCULAR | Status: DC | PRN

## 2017-02-10 MED ORDER — HYDROMORPHONE HCL 2 MG/ML IJ SOLN
INTRAMUSCULAR | Status: AC
  Filled 2017-02-10: qty 1

## 2017-02-10 MED ORDER — PHENYLEPHRINE 40 MCG/ML (10ML) SYRINGE FOR IV PUSH (FOR BLOOD PRESSURE SUPPORT)
PREFILLED_SYRINGE | INTRAVENOUS | Status: DC | PRN
  Administered 2017-02-10: 80 ug via INTRAVENOUS

## 2017-02-10 MED ORDER — TIOTROPIUM BROMIDE MONOHYDRATE 18 MCG IN CAPS
18.0000 ug | ORAL_CAPSULE | Freq: Every day | RESPIRATORY_TRACT | Status: DC
  Filled 2017-02-10: qty 5

## 2017-02-10 MED ORDER — ONDANSETRON HCL 4 MG/2ML IJ SOLN
INTRAMUSCULAR | Status: AC
  Filled 2017-02-10: qty 2

## 2017-02-10 MED ORDER — GABAPENTIN 300 MG PO CAPS
600.0000 mg | ORAL_CAPSULE | Freq: Two times a day (BID) | ORAL | Status: DC
  Administered 2017-02-11 – 2017-02-14 (×7): 600 mg via ORAL
  Filled 2017-02-10 (×7): qty 2

## 2017-02-10 MED ORDER — ALBUTEROL SULFATE (2.5 MG/3ML) 0.083% IN NEBU: 3.0000 mL | INHALATION_SOLUTION | Freq: Four times a day (QID) | RESPIRATORY_TRACT | Status: DC | PRN

## 2017-02-10 MED ORDER — BUPIVACAINE HCL (PF) 0.5 % IJ SOLN
INTRAMUSCULAR | Status: DC | PRN
  Administered 2017-02-10: 3 mL via INTRATHECAL

## 2017-02-10 MED ORDER — LOSARTAN POTASSIUM 25 MG PO TABS
25.0000 mg | ORAL_TABLET | Freq: Every day | ORAL | Status: DC
  Administered 2017-02-11 – 2017-02-14 (×4): 25 mg via ORAL
  Filled 2017-02-10 (×5): qty 1

## 2017-02-10 MED ORDER — DIPHENHYDRAMINE HCL 12.5 MG/5ML PO ELIX
12.5000 mg | ORAL_SOLUTION | ORAL | Status: DC | PRN
  Administered 2017-02-11: 25 mg via ORAL
  Filled 2017-02-10: qty 10

## 2017-02-10 MED ORDER — ONDANSETRON HCL 4 MG/2ML IJ SOLN: 4.0000 mg | Freq: Once | INTRAMUSCULAR | Status: DC | PRN

## 2017-02-10 MED ORDER — PANTOPRAZOLE SODIUM 40 MG IV SOLR
40.0000 mg | Freq: Two times a day (BID) | INTRAVENOUS | Status: DC
  Administered 2017-02-10 – 2017-02-11 (×3): 40 mg via INTRAVENOUS
  Filled 2017-02-10 (×3): qty 40

## 2017-02-10 MED ORDER — SODIUM CHLORIDE 0.9 % IV SOLN
INTRAVENOUS | Status: DC
  Administered 2017-02-10: 18:00:00 via INTRAVENOUS
  Administered 2017-02-11: 1000 mL via INTRAVENOUS
  Administered 2017-02-12 – 2017-02-13 (×2): via INTRAVENOUS

## 2017-02-10 MED ORDER — ONDANSETRON HCL 4 MG/2ML IJ SOLN
INTRAMUSCULAR | Status: DC | PRN
  Administered 2017-02-10: 4 mg via INTRAVENOUS

## 2017-02-10 MED ORDER — MOMETASONE FURO-FORMOTEROL FUM 200-5 MCG/ACT IN AERO
2.0000 | INHALATION_SPRAY | Freq: Two times a day (BID) | RESPIRATORY_TRACT | Status: DC
  Filled 2017-02-10: qty 8.8

## 2017-02-10 MED ORDER — HYDROMORPHONE HCL 1 MG/ML IJ SOLN: 0.2500 mg | INTRAMUSCULAR | Status: DC | PRN

## 2017-02-10 MED ORDER — ALUM & MAG HYDROXIDE-SIMETH 200-200-20 MG/5ML PO SUSP: 30.0000 mL | ORAL | Status: DC | PRN

## 2017-02-10 MED ORDER — PHENYLEPHRINE 40 MCG/ML (10ML) SYRINGE FOR IV PUSH (FOR BLOOD PRESSURE SUPPORT)
PREFILLED_SYRINGE | INTRAVENOUS | Status: AC
  Filled 2017-02-10: qty 10

## 2017-02-10 SURGICAL SUPPLY — 40 items
ACETAB CUP W/GRIPTION 54 (Plate) ×2 IMPLANT
BAG ZIPLOCK 12X15 (MISCELLANEOUS) IMPLANT
BENZOIN TINCTURE PRP APPL 2/3 (GAUZE/BANDAGES/DRESSINGS) IMPLANT
BLADE SAW SGTL 18X1.27X75 (BLADE) ×2 IMPLANT
CAPT HIP TOTAL 2 ×2 IMPLANT
CELLS DAT CNTRL 66122 CELL SVR (MISCELLANEOUS) ×1 IMPLANT
CLOTH BEACON ORANGE TIMEOUT ST (SAFETY) ×2 IMPLANT
COVER PERINEAL POST (MISCELLANEOUS) ×2 IMPLANT
CUP ACETAB W/GRIPTION 54 (Plate) ×1 IMPLANT
DRAPE STERI IOBAN 125X83 (DRAPES) ×2 IMPLANT
DRAPE U-SHAPE 47X51 STRL (DRAPES) ×4 IMPLANT
DRSG AQUACEL AG ADV 3.5X10 (GAUZE/BANDAGES/DRESSINGS) ×2 IMPLANT
DRSG TEGADERM 2-3/8X2-3/4 SM (GAUZE/BANDAGES/DRESSINGS) ×2 IMPLANT
DURAPREP 26ML APPLICATOR (WOUND CARE) ×2 IMPLANT
ELECT REM PT RETURN 9FT ADLT (ELECTROSURGICAL) ×2
ELECTRODE REM PT RTRN 9FT ADLT (ELECTROSURGICAL) ×1 IMPLANT
GAUZE XEROFORM 1X8 LF (GAUZE/BANDAGES/DRESSINGS) ×2 IMPLANT
GLOVE BIO SURGEON STRL SZ7.5 (GLOVE) ×2 IMPLANT
GLOVE BIOGEL PI IND STRL 7.5 (GLOVE) ×4 IMPLANT
GLOVE BIOGEL PI IND STRL 8 (GLOVE) ×2 IMPLANT
GLOVE BIOGEL PI INDICATOR 7.5 (GLOVE) ×4
GLOVE BIOGEL PI INDICATOR 8 (GLOVE) ×2
GLOVE ECLIPSE 8.0 STRL XLNG CF (GLOVE) ×2 IMPLANT
GOWN STRL REUS W/TWL XL LVL3 (GOWN DISPOSABLE) ×8 IMPLANT
HANDPIECE INTERPULSE COAX TIP (DISPOSABLE) ×1
HOLDER FOLEY CATH W/STRAP (MISCELLANEOUS) ×2 IMPLANT
PACK ANTERIOR HIP CUSTOM (KITS) ×2 IMPLANT
RTRCTR WOUND ALEXIS 18CM MED (MISCELLANEOUS) ×2
SET HNDPC FAN SPRY TIP SCT (DISPOSABLE) ×1 IMPLANT
SPONGE LAP 18X18 X RAY DECT (DISPOSABLE) ×4 IMPLANT
STAPLER VISISTAT 35W (STAPLE) IMPLANT
STRIP CLOSURE SKIN 1/2X4 (GAUZE/BANDAGES/DRESSINGS) IMPLANT
SUT ETHIBOND NAB CT1 #1 30IN (SUTURE) ×2 IMPLANT
SUT MNCRL AB 4-0 PS2 18 (SUTURE) IMPLANT
SUT VIC AB 0 CT1 36 (SUTURE) ×2 IMPLANT
SUT VIC AB 1 CT1 36 (SUTURE) ×2 IMPLANT
SUT VIC AB 2-0 CT1 27 (SUTURE) ×2
SUT VIC AB 2-0 CT1 TAPERPNT 27 (SUTURE) ×2 IMPLANT
TRAY FOLEY W/METER SILVER 16FR (SET/KITS/TRAYS/PACK) ×2 IMPLANT
YANKAUER SUCT BULB TIP 10FT TU (MISCELLANEOUS) ×2 IMPLANT

## 2017-02-10 NOTE — Progress Notes (Signed)
Called to PACU with patient obtunded and hypoventilating following 1.25mg  Dilaudid. 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 Pupils pin point and decreased mental status - probable CO2 narcosis. Narcan 0.2mg  administered and patient stimulated to breath and cough. ET Co2 measurement started and initial vaues 100+. With deep breathing, ETCO2 down to mid nineties and patient much more awake. ABG: 7.18 83 189 0.7 - Pure respiratory acidosis.  Plan: Continue stimulate patient, ICU bed, CCM consult Hopefully avoid mechanical ventilation.  Lillia Abed MD

## 2017-02-10 NOTE — Anesthesia Procedure Notes (Signed)
Spinal  Patient location during procedure: OR Start time: 02/10/2017 9:46 AM End time: 02/10/2017 9:50 AM Staffing Anesthesiologist: Lillia Abed Performed: anesthesiologist  Preanesthetic Checklist Completed: patient identified, surgical consent, pre-op evaluation, timeout performed, IV checked, risks and benefits discussed and monitors and equipment checked Spinal Block Patient position: sitting Prep: DuraPrep Patient monitoring: heart rate, cardiac monitor, continuous pulse ox and blood pressure Approach: right paramedian Location: L3-4 Injection technique: single-shot Needle Needle type: Pencan  Needle gauge: 24 G Needle length: 9 cm Needle insertion depth: 9 cm

## 2017-02-10 NOTE — Consult Note (Signed)
PULMONARY / CRITICAL CARE MEDICINE   Name: Ricky Johnston MRN: [id number] DOB: 1951/05/07    ADMISSION DATE:  02/10/2017 CONSULTATION DATE:  3/16  REFERRING MD:  Conrad [place]   CHIEF COMPLAINT:   Acute Hypercarbic respiratory failure.   HISTORY OF PRESENT ILLNESS:   This is a 66 year old male w/ sig h/o obesity, OSA, possible COPD. On CPAP at home. Presents to [facility] for elective left ORIF. Surgery uneventful. He was done under spinal w/ conscious sedation after OR was returned to PACU and slow to arouse. Got 1 amp of narcan w/ minimal response. ETCO2 in 90s. Was initially placed on facemask and nursing staff encouraged to stimulate the pt. On PCCM arrival pt would awaken easily but was lethargic and would not talk. ETCO2 90s. Placed on NIPPV. ETCO2 now in 70s an more awake.   PAST MEDICAL HISTORY :  He  has a past medical history of Arthritis; Cancer ([place]); Complication of anesthesia; COPD (chronic obstructive pulmonary disease) ([place]); Diabetes mellitus without complication ([place]); DVT (deep venous thrombosis) ([place]) (01/08/2014, 2010); GERD (gastroesophageal reflux disease); Hallux limitus (05/18/2015); Headache; History of kidney stones; Hypercholesterolemia; Hypertension; Impaired hearing; Intervertebral disc syndrome; Kidney stones; Pulmonary fibrosis ([place]); Renal calculi (01/08/2014); Restless legs (01/08/2014); Sciatic leg pain; Sleep apnea; and Tinnitus.  PAST SURGICAL HISTORY: He  has a past surgical history that includes Bladder repair; Abdominal surgery; Back surgery; Eye surgery; Hernia repair; left heart catheterization with coronary angiogram (N/A, 01/09/2015); and Nephroureterectomy (03/18/2016).  Allergies  Allergen Reactions  . Poison Ivy Extract [Poison Ivy Extract] Rash  . Poison Oak Extract Rash    No current facility-administered medications on file prior to encounter.    Current Outpatient Prescriptions on File Prior to Encounter  Medication Sig  . acetaminophen (TYLENOL) 325 MG  tablet Take 3 tablets (975 mg total) by mouth every 8 (eight) hours as needed for mild pain. (Patient taking differently: Take 975 mg by mouth 2 (two) times daily as needed for mild pain. )  . cholecalciferol (VITAMIN D) 1000 UNITS tablet Take 1,000 Units by mouth daily.   . ferrous sulfate 325 (65 FE) MG tablet Take 325 mg by mouth daily.   . furosemide (LASIX) 20 MG tablet Take 40 mg by mouth daily.   Marland Kitchen gabapentin (NEURONTIN) 300 MG capsule Take 600 mg by mouth 2 (two) times daily.   . insulin aspart (NOVOLOG) 100 UNIT/ML injection Inject 30-82 Units into the skin 3 (three) times daily before meals. 50 units at breakfast. 30 units at lunch 82 units at bedtime  . insulin glargine (LANTUS) 100 UNIT/ML injection Inject 50 Units into the skin 2 (two) times daily.   . metFORMIN (GLUCOPHAGE) 1000 MG tablet Take 1,000 mg by mouth 2 (two) times daily with a meal.  . omeprazole (PRILOSEC) 20 MG capsule Take 20 mg by mouth 2 (two) times daily before a meal.  . oxybutynin (DITROPAN) 5 MG tablet Take 5 mg by mouth every 6 (six) hours as needed for bladder spasms.  Marland Kitchen rOPINIRole (REQUIP) 0.5 MG tablet Take 0.5 mg by mouth at bedtime.  Marland Kitchen aspirin 81 MG chewable tablet Chew 1 tablet (81 mg total) by mouth daily.    FAMILY HISTORY:  His has no family status information on file.    SOCIAL HISTORY: He  reports that he quit smoking about 16 years ago. His smoking use included Cigarettes. He has a 33.00 pack-year smoking history. He has never used smokeless tobacco. He reports that he drinks alcohol. He reports  that he uses drugs.  REVIEW OF SYSTEMS:   Unable   SUBJECTIVE:  Lethargic but now more easily awakens  VITAL SIGNS: BP 136/75   Pulse (!) 105   Temp 98 F (36.7 C)   Resp (!) 22   Ht 5\' 8"  (1.727 m)   Wt 294 lb (133.4 kg)   SpO2 97%   BMI 44.70 kg/m   HEMODYNAMICS:    VENTILATOR SETTINGS:    INTAKE / OUTPUT: No intake/output data recorded.  PHYSICAL EXAMINATION: General:  Obese 66  year old male. Now on awakens more rapidly  Neuro:  Opens eyes. Follow commands HEENT:  BIPAP mask in place. No JVD  Cardiovascular:  RRR  Lungs: decreased t/o ETCO2 now 70s Abdomen:  Obese + bowel sounds Musculoskeletal:  Equal st and bulk, left hip dressing intact  Skin:  Warm and dry   LABS:  BMET No results for input(s): NA, K, CL, CO2, BUN, CREATININE, GLUCOSE in the last 168 hours.  Electrolytes No results for input(s): CALCIUM, MG, PHOS in the last 168 hours.  CBC No results for input(s): WBC, HGB, HCT, PLT in the last 168 hours.  Coag's No results for input(s): APTT, INR in the last 168 hours.  Sepsis Markers No results for input(s): LATICACIDVEN, PROCALCITON, O2SATVEN in the last 168 hours.  ABG  Recent Labs Lab 02/10/17 1509  PHART 7.181*  PCO2ART 82.8*  PO2ART 189*    Liver Enzymes No results for input(s): AST, ALT, ALKPHOS, BILITOT, ALBUMIN in the last 168 hours.  Cardiac Enzymes No results for input(s): TROPONINI, PROBNP in the last 168 hours.  Glucose  Recent Labs Lab 02/10/17 0713 02/10/17 0910  GLUCAP 130* 117*    Imaging Dg C-arm 61-120 Min-no Report  Result Date: 02/10/2017 Fluoroscopy was utilized by the requesting physician.  No radiographic interpretation.   Dg Hip Operative Unilat W Or W/o Pelvis Left  Result Date: 02/10/2017 CLINICAL DATA:  Imaging provided for left hip arthroplasty. EXAM: DG C-ARM 61-120 MIN-NO REPORT; OPERATIVE LEFT HIP WITH PELVIS COMPARISON:  None. FLUOROSCOPY TIME:  Fluoroscopy Time:  30 seconds Number of Acquired Spot Images: 2 FINDINGS: Submitted images show placement of a total left hip arthroplasty. The femoral and acetabular components appear well seated and aligned. There is no acute fracture or evidence of an operative complication. IMPRESSION: Well-positioned left hip total arthroplasty. Electronically Signed   By: Lajean Manes M.D.   On: 02/10/2017 12:31     STUDIES:     CULTURES:   ANTIBIOTICS:     LINES/TUBES:   DISCUSSION:   ASSESSMENT / PLAN:  Acute on Chronic Hypercarbic Respiratory failure in setting of residual sedating medications superimposed on underlying OSA/OHS, COPD and chronic scaring from prior PNAs (followed at [facility]) -baseline CO2 calculates around 50; on 1 liters O2 24/7 Plan Admit to ICU BIPAP overnight w/ rests.  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   Erick Colace ACNP-BC [place] Pager #  [phone] OR # [phone] if no answer   02/10/2017, 4:17 PM    ATTENDING NOTE / ATTESTATION NOTE :   I have discussed the case with the resident/APP  Marni Griffon NP  I agree with the resident/APP's  history, physical examination, assessment, and plans.    I have edited the above note and modified it according to our agreed history, physical examination, assessment and plan.   Briefly, patient with 20-pack-year smoking history, quit when he was in his 87s, known to have COPD for which he is on Spiriva and Symbicort, known to have sleep apnea and obesity hypoventilation syndrome for which he is on CPAP (fairly compliant), with chronic hypoxemic respiratory failure for which he is on 1 L oxygen 24/7, who came in today for elective left ORIF under spinal anesthesia. He was difficult to arouse post operatively. Did not get better on Narcan. His end-tidal was in the 90s. PCCM was asked to see. Patient was placed on  BiPAP 14/5 and his tidal volumes were 500-600 and his end-tidal improved to 60s. ABG prior to BiPAP showed acute on chronic hypercapnia with a CO2 of 82 and pH of 7.18. He is awake now, follows commands, intermittently goes back to sleep.  He had surgery back in November 2017 when he is urinary bladder and one kidney was removed because of bladder cancer. Postoperatively, he had hypercapnia as well for which she was placed on BiPAP.  Patient has history of DVT in his upper extremity many years ago and anticoagulation has been weaned off.  He usually goes to [place] in [place] and he has a pulmonologist. Pulmonologist also follows him for sleep apnea and he has a CPAP machine which  was replaced couple years ago.  Vitals:  Vitals:   02/10/17 1615 02/10/17 1628 02/10/17 1630 02/10/17 1645  BP: 132/70  (!) 144/75 138/77  Pulse: 97 100 (!) 102 100  Resp: 13 (!) 21 (!) 26 14  Temp:    98.3 F (36.8 C)  TempSrc:      SpO2: 94% 92% 92% 94%  Weight:      Height:        Constitutional/General: morbidly obese,  Wearing bipap, comfortable, NAD.   Body mass index is 44.7 kg/m. Wt Readings from Last 3 Encounters:  02/10/17 133.4 kg (294 lb)  02/02/17 133.7 kg (294 lb 12.8 oz)  01/10/15 123.5 kg (272 lb 3.2 oz)    HEENT: PERLA, anicteric sclerae. (-) Oral thrush. Bipap mask in place. Tidal volumes 500-600 mls. O2 sats 94% on 40% Fio2.   Neck: No masses. Midline trachea. No JVD, (-) LAD. (-) bruits appreciated.  Respiratory/Chest: Grossly normal chest. (-) deformity. (-) Accessory muscle use.  Symmetric expansion. Diminished BS on both lower lung zones. (-) wheezing, crackles, rhonchi (-) egophony  Cardiovascular: Regular rate and  rhythm, heart sounds normal, no murmur or gallops,  Trace peripheral edema  Gastrointestinal:  Normal bowel sounds. Soft, non-tender. No hepatosplenomegaly.  (-) masses.   Musculoskeletal:  Normal muscle tone. S/P L ORIF  Extremities: Grossly normal. (-)  clubbing, cyanosis.  Trace edema. S/P L ORIF.   Skin: (-) rash,lesions seen.   Neurological/Psychiatric :CN grossly intact. (-) lateralizing signs. arousable, follows commands, then nods off to sleep after.     CBC No results for input(s): WBC, HGB, HCT, PLT in the last 72 hours.  Coag's No results for input(s): APTT, INR in the last 72 hours.  BMET No results for input(s): NA, K, CL, CO2, BUN,  CREATININE, GLUCOSE in the last 72 hours.  Electrolytes No results for input(s): CALCIUM, MG, PHOS in the last 72 hours.  Sepsis Markers No results for input(s): PROCALCITON, O2SATVEN in the last 72 hours.  Invalid input(s): LACTICACIDVEN  ABG Recent Labs     02/10/17  1509  PHART  7.181*  PCO2ART  82.8*  PO2ART  189*    Liver Enzymes No results for input(s): AST, ALT, ALKPHOS, BILITOT, ALBUMIN in the last 72 hours.  Cardiac Enzymes No results for input(s): TROPONINI, PROBNP in the last 72 hours.  Glucose Recent Labs     02/10/17  0713  02/10/17  0910  GLUCAP  130*  117*    Imaging Dg C-arm 61-120 Min-no Report  Result Date: 02/10/2017 Fluoroscopy was utilized by the requesting physician.  No radiographic interpretation.   Dg Hip Operative Unilat W Or W/o Pelvis Left  Result Date: 02/10/2017 CLINICAL DATA:  Imaging provided for left hip arthroplasty. EXAM: DG C-ARM 61-120 MIN-NO REPORT; OPERATIVE LEFT HIP WITH PELVIS COMPARISON:  None. FLUOROSCOPY TIME:  Fluoroscopy Time:  30 seconds Number of Acquired Spot Images: 2 FINDINGS: Submitted images show placement of a total left hip arthroplasty. The femoral and acetabular components appear well seated and aligned. There is no acute fracture or evidence of an operative complication. IMPRESSION: Well-positioned left hip total arthroplasty. Electronically Signed   By: Lajean Manes M.D.   On: 02/10/2017 12:31    Assessment/Plan : Acute on chronic hypoxemic hypercapnic respiratory failure secondary to the following: Pain  meds as patient is post op OSA OHS COPD, not in exacerbation RVD/Morbid obesity - Agree with admitting to ICU. Surgery service will be primary. PCCM will consult. - Keep on current BiPAP settings for now. He is 14/5, good tidal volumes. He is on 40% FiO2 with O2 sats 94-96%. We can try cutting down the FiO2 to 30-35% as he retains CO2 as long as O2 saturation is more than 88-90 percent. I discussed this with respiratory therapist. Later on also, we can adjust his IPAP to 16-18 cm water if he gets sleepy again with pain medications and/or when his MV or TV decreases. I think we have room to go as far as going up on the IPAP pressure. Hopefully,we do not need to intubate this patient. - I would keep an end tidal volume to keep an eye on trend. - In the morning, we can try taking him off the BiPAP and see how he does. He can have breaks off the BiPAP tomorrow. Slowly wean off the BiPAP over the weekend. - I told the wife to bring the patient's CPAP machine which he can also use. - wean off FiO2 to 1 L O2 via [place] which is his home dose. - Continue with Brovana and Budesonide via nebulizer twice a day. On discharge, switch him back to Symbicort and Spiriva. - Agree with cutting down the Dilaudid dose. Minimize pain sedation as much as possible. - Keep NPO for now   S/P L ORIF - per surgery - minimize pain meds as much as possible   CKD, S/P Nephrectomy. H/O urinary bladder - check chem, cbc - WOF congestion. CXR this pm was reassuring.    HTN - observe BP off home meds - PRN lopressor  Best practice - SCDs for DVT prophylaxis - May need PPI for SUP      I spent  30 minutes of Critical Care time with this patient today. This is my time spent independent of the  APP or resident.   Family :Family updated at length today. Wife extensively updated at bedside.   Monica Becton, MD 02/10/2017, 4:48 PM [facility] Pager [phone] After 3 pm or if no  answer, call [phone]

## 2017-02-10 NOTE — Brief Op Note (Signed)
02/10/2017  11:34 AM  PATIENT:  Ricky Johnston  66 y.o. male  PRE-OPERATIVE DIAGNOSIS:  left hip osteoarthritis  POST-OPERATIVE DIAGNOSIS:  left hip osteoarthritis  PROCEDURE:  Procedure(s): LEFT TOTAL HIP ARTHROPLASTY ANTERIOR APPROACH (Left)  SURGEON:  Surgeon(s) and Role:    * Mcarthur Rossetti, MD - Primary  PHYSICIAN ASSISTANT: Benita Stabile, PA-C  ANESTHESIA:   spinal  EBL:  Total I/O In: 1000 [I.V.:1000] Out: 700 [Urine:300; Blood:400]   COUNTS:  YES  DICTATION: .Other Dictation: Dictation Number [id number]  PLAN OF CARE: Admit to inpatient   PATIENT DISPOSITION:  PACU - hemodynamically stable.   Delay start of Pharmacological VTE agent (>24hrs) due to surgical blood loss or risk of bleeding: no

## 2017-02-10 NOTE — H&P (Signed)
TOTAL HIP ADMISSION H&P  Patient is admitted for left total hip arthroplasty.  Subjective:  Chief Complaint: left hip pain  HPI: Ricky Johnston, 66 y.o. male, has a history of pain and functional disability in the left hip(s) due to arthritis and patient has failed non-surgical conservative treatments for greater than 12 weeks to include NSAID's and/or analgesics, corticosteriod injections, flexibility and strengthening excercises, use of assistive devices, weight reduction as appropriate and activity modification.  Onset of symptoms was gradual starting 2 years ago with gradually worsening course since that time.The patient noted no past surgery on the left hip(s).  Patient currently rates pain in the left hip at 10 out of 10 with activity. Patient has night pain, worsening of pain with activity and weight bearing, trendelenberg gait, pain that interfers with activities of daily living, pain with passive range of motion and crepitus. Patient has evidence of subchondral cysts, subchondral sclerosis, periarticular osteophytes and joint space narrowing by imaging studies. This condition presents safety issues increasing the risk of falls.  There is no current active infection.  Patient Active Problem List   Diagnosis Date Noted  . Unilateral primary osteoarthritis, left hip 02/10/2017  . Pain in the chest   . Chronic systolic heart failure ([place])   . Esophageal reflux   . Chest pain 01/08/2015  . Insulin dependent diabetes mellitus ([place]) 01/08/2015  . COPD (chronic obstructive pulmonary disease) ([place]) 01/08/2015  . Hyperlipidemia associated with type 2 diabetes mellitus ([place]) 01/08/2015   Past Medical History:  Diagnosis Date  . Arthritis   . Cancer [facility])    bladder cancer currently 2016  . Complication of anesthesia    hard for him to wake up from Anesthesia from left nephrectomy  . COPD (chronic obstructive pulmonary disease) ([place])   . Diabetes mellitus without complication ([place])   . DVT  (deep venous thrombosis) ([place]) 01/08/2014, 2010   upper extremity  . GERD (gastroesophageal reflux disease)   . Hallux limitus 05/18/2015   from notes from [place]   . Headache    migraines  . History of kidney stones   . Hypercholesterolemia   . Hypertension   . Impaired hearing   . Intervertebral disc syndrome   . Kidney stones   . Pulmonary fibrosis ([place])   . Renal calculi 01/08/2014   frrom noted from [facility]in chart  . Restless legs 01/08/2014  . Sciatic leg pain    paralysis of sciatic nerve  . Sleep apnea    CPAP/BIPAP  . Tinnitus     Past Surgical History:  Procedure Laterality Date  . ABDOMINAL SURGERY    . BACK SURGERY    . BLADDER REPAIR    . EYE SURGERY    . HERNIA REPAIR    . LEFT HEART CATHETERIZATION WITH CORONARY ANGIOGRAM N/A 01/09/2015   Procedure: LEFT HEART CATHETERIZATION WITH CORONARY ANGIOGRAM;  Surgeon: Clent Demark, MD;  Location: [facility] CATH LAB;  Service: Cardiovascular;  Laterality: N/A;  . NEPHROURETERECTOMY  03/18/2016   with bladder cuff excision- from noted in chart from [place] Prior to Admission  Medication Sig Dispense Refill Last Dose  . acetaminophen (TYLENOL) 325 MG tablet Take 3 tablets (975 mg total) by mouth every 8 (eight) hours as needed for mild pain. (Patient taking differently: Take 975 mg by mouth 2 (two) times daily as needed for mild pain. )   Not Taking  . albuterol (PROVENTIL HFA;VENTOLIN HFA) 108 (90 Base) MCG/ACT inhaler Inhale  2 puffs into the lungs every 6 (six) hours as needed for wheezing or shortness of breath.     Marland Kitchen ammonium lactate (LAC-HYDRIN) 12 % lotion Apply 1 application topically daily as needed for dry skin.     Marland Kitchen atorvastatin (LIPITOR) 80 MG tablet Take 80 mg by mouth at bedtime.     . budesonide-formoterol (SYMBICORT) 160-4.5 MCG/ACT inhaler Inhale 2 puffs into the lungs 2 (two) times daily.     . cetirizine (ZYRTEC) 10 MG tablet Take 10 mg by mouth daily as needed for allergies.      . cholecalciferol (VITAMIN D) 1000 UNITS tablet Take 1,000 Units by mouth daily.    Taking  . ferrous sulfate 325 (65 FE) MG tablet Take 325 mg by mouth daily.    Taking  . furosemide (LASIX) 20 MG tablet Take 40 mg by mouth daily.    Taking  . gabapentin (NEURONTIN) 300 MG capsule Take 600 mg by mouth 2 (two) times daily.    Taking  . insulin aspart (NOVOLOG) 100 UNIT/ML injection Inject 35-60 Units into the skin 3 (three) times daily before meals. 25 units at breakfast. 25 units at lunch 82 units at bedtime   Taking  . insulin glargine (LANTUS) 100 UNIT/ML injection Inject 45 Units into the skin 2 (two) times daily.   Taking  . losartan (COZAAR) 25 MG tablet Take 25 mg by mouth daily.     . metFORMIN (GLUCOPHAGE) 1000 MG tablet Take 1,000 mg by mouth 2 (two) times daily with a meal.   Taking  . omeprazole (PRILOSEC) 20 MG capsule Take 20 mg by mouth 2 (two) times daily before a meal.   Taking  . oxybutynin (DITROPAN) 5 MG tablet Take 5 mg by mouth every 6 (six) hours as needed for bladder spasms.   Taking  . OXYGEN Inhale 1 L into the lungs continuous. At night time may increase to 2 L as needed for shortness of breath     . rOPINIRole (REQUIP) 0.5 MG tablet Take 0.5 mg by mouth at bedtime.   Taking  . SUPER B COMPLEX/C PO Take 1 tablet by mouth 2 (two) times daily.     . tamsulosin (FLOMAX) 0.4 MG CAPS capsule Take 0.4 mg by mouth daily.     Marland Kitchen tiotropium (SPIRIVA HANDIHALER) 18 MCG inhalation capsule Place 18 mcg into inhaler and inhale daily.     . traMADol (ULTRAM) 50 MG tablet Take 50 mg by mouth 2 (two) times daily as needed for pain.     Marland Kitchen aspirin 81 MG chewable tablet Chew 1 tablet (81 mg total) by mouth daily.   Taking   Allergies  Allergen Reactions  . Poison Ivy Extract [Poison Ivy Extract] Rash  . Poison Oak Extract Rash    Social History  Substance Use Topics  . Smoking status: Former Smoker    Packs/day: 1.00    Years: 33.00    Types: Cigarettes    Quit date: 04/06/2000   . Smokeless tobacco: Never Used     Comment: use to chew cigars  . Alcohol use Yes     Comment: rarely    History reviewed. No pertinent family history.   Review of Systems  Musculoskeletal: Positive for joint pain.  All other systems reviewed and are negative.   Objective:  Physical Exam  Constitutional: He is oriented to person, place, and time. He appears well-developed and well-nourished.  HENT:  Head: Normocephalic and atraumatic.  Eyes: EOM are normal. Pupils  are equal, round, and reactive to light.  Neck: Normal range of motion. Neck supple.  Cardiovascular: Normal rate and regular rhythm.   Respiratory: Effort normal. He has wheezes.  GI: Soft. Bowel sounds are normal.  Musculoskeletal:       Left hip: He exhibits decreased range of motion, decreased strength, tenderness and bony tenderness.  Neurological: He is alert and oriented to person, place, and time.  Skin: Skin is warm and dry.  Psychiatric: He has a normal mood and affect.    Vital signs in last 24 hours: Temp:  [99 F (37.2 C)] 99 F (37.2 C) (03/16 0720) Pulse Rate:  [68] 68 (03/16 0720) Resp:  [20] 20 (03/16 0720) BP: (148)/(66) 148/66 (03/16 0720) SpO2:  [92 %] 92 % (03/16 0720)  Labs:   Estimated body mass index is 44.82 kg/m as calculated from the following:   Height as of 02/02/17: 5\' 8"  (1.727 m).   Weight as of 02/02/17: 294 lb 12.8 oz (133.7 kg).   Imaging Review Plain radiographs demonstrate severe degenerative joint disease of the left hip(s). The bone quality appears to be good for age and reported activity level.  Assessment/Plan:  End stage arthritis, left hip(s)  The patient history, physical examination, clinical judgement of the provider and imaging studies are consistent with end stage degenerative joint disease of the left hip(s) and total hip arthroplasty is deemed medically necessary. The treatment options including medical management, injection therapy, arthroscopy and  arthroplasty were discussed at length. The risks and benefits of total hip arthroplasty were presented and reviewed. The risks due to aseptic loosening, infection, stiffness, dislocation/subluxation,  thromboembolic complications and other imponderables were discussed.  The patient acknowledged the explanation, agreed to proceed with the plan and consent was signed. Patient is being admitted for inpatient treatment for surgery, pain control, PT, OT, prophylactic antibiotics, VTE prophylaxis, progressive ambulation and ADL's and discharge planning.The patient is planning to be discharged home with home health services

## 2017-02-10 NOTE — Progress Notes (Signed)
[facility] Progress Note Patient Name: Ricky Johnston DOB: [birth year] MRN: [id number]   Date of Service  02/10/2017  HPI/Events of Note  ABG on 35%/IPAP 18/EPAP 5 and rate 8 = 7.21/73.1/81.7. Patient is also obtunded and will not respond to sternal rub. Patient woke up with Narcan 0.4 mg IV.   eICU Interventions  Will order: 1. Narcan 0.4 mg IV PRN decreased LOC. 2. Increase Rate on BiPAP to 16. 3. ABG at 7:30 PM. 4. Decrease Dilaudid dose from 1 mg IV Q 2 hours PRN to 0.5 mg IV Q 2 hours PRN.      Intervention Category Major Interventions: Change in mental status - evaluation and management  Sommer,Steven Eugene 02/10/2017, 6:19 PM

## 2017-02-10 NOTE — Progress Notes (Signed)
eLink Physician-Brief Progress Note Patient Name: Ricky Johnston DOB: [birth year] MRN: [id number]   Date of Service  02/10/2017  HPI/Events of Note  Patient is NPO and is currently on Lantus 50 units BID + Novolog SSI.  eICU Interventions  Will order: 1. Decrease Lantus to 20 units BID. 2. Q 4 hour moderate Novolog SSI.      Intervention Category Major Interventions: Hyperglycemia - active titration of insulin therapy  Lysle Dingwall 02/10/2017, 6:05 PM

## 2017-02-10 NOTE — Anesthesia Preprocedure Evaluation (Signed)
Anesthesia Evaluation  Patient identified by MRN, date of birth, ID band Patient awake    Reviewed: Allergy & Precautions, NPO status , Patient's Chart, lab work & pertinent test results  Airway Mallampati: II  TM Distance: >3 FB Neck ROM: Full    Dental   Pulmonary sleep apnea , former smoker,    Pulmonary exam normal        Cardiovascular hypertension, Pt. on medications Normal cardiovascular exam     Neuro/Psych    GI/Hepatic GERD  Medicated and Controlled,  Endo/Other  diabetes, Type 2, Insulin Dependent, Oral Hypoglycemic Agents  Renal/GU      Musculoskeletal   Abdominal   Peds  Hematology   Anesthesia Other Findings   Reproductive/Obstetrics                             Anesthesia Physical Anesthesia Plan  ASA: III  Anesthesia Plan: Spinal   Post-op Pain Management:    Induction: Intravenous  Airway Management Planned: Simple Face Mask  Additional Equipment:   Intra-op Plan:   Post-operative Plan:   Informed Consent: I have reviewed the patients History and Physical, chart, labs and discussed the procedure including the risks, benefits and alternatives for the proposed anesthesia with the patient or authorized representative who has indicated his/her understanding and acceptance.     Plan Discussed with: CRNA and Surgeon  Anesthesia Plan Comments:         Anesthesia Quick Evaluation

## 2017-02-10 NOTE — Anesthesia Postprocedure Evaluation (Signed)
Anesthesia Post Note  Patient: Ricky Johnston  Procedure(s) Performed: Procedure(s) (LRB): LEFT TOTAL HIP ARTHROPLASTY ANTERIOR APPROACH (Left)  Patient location during evaluation: PACU Anesthesia Type: Spinal Level of consciousness: oriented and awake and alert Pain management: pain level controlled Vital Signs Assessment: post-procedure vital signs reviewed and stable Respiratory status: spontaneous breathing and non-rebreather facemask Cardiovascular status: blood pressure returned to baseline and stable Postop Assessment: no headache and no backache Anesthetic complications: no Comments: Pt hypoventilating. BPAP - Tfer to SICU to be followed by CCM       Last Vitals:  Vitals:   02/10/17 1552 02/10/17 1601  BP:    Pulse: (!) 105 (!) 105  Resp: (!) 26 (!) 22  Temp:      Last Pain:  Vitals:   02/10/17 1545  TempSrc:   PainSc: Asleep                 Itzayana Pardy DAVID

## 2017-02-10 NOTE — Op Note (Signed)
NAME:  Ricky Johnston, Ricky Johnston NO.:  MEDICAL RECORD NO.:  [id number]  LOCATION:                                 FACILITY:  PHYSICIAN:  Lind Guest. Ninfa Linden, M.D.DATE OF BIRTH:  DATE OF PROCEDURE:  02/10/2017 DATE OF DISCHARGE:                              OPERATIVE REPORT   PREOPERATIVE DIAGNOSIS:  Primary osteoarthritis and degenerative joint disease, left hip.  POSTOPERATIVE DIAGNOSIS:  Primary osteoarthritis and degenerative joint disease, left hip.  PROCEDURE:  Left total hip arthroplasty through direct anterior approach.  IMPLANTS:  [employer] G[employer]ion acetabular component size 56 with 3 screws, size 36+ 0 polyethylene liner, size 12 Corail femoral component with varus offset, size 36+ 1.5 ceramic hip ball.  SURGEON:  Lind Guest. Ninfa Linden,  ASSISTANT:  Erskine Emery, PA-C.  ANESTHESIA:  Spinal.  ANTIBIOTICS:  3 g IV Ancef.  BLOOD LOSS:  400 mL.  COMPLICATIONS:  None.  INDICATIONS:  [name] is a very pleasant 66 year old gentleman who is morbidly obese with a body mass index of about 45.  He has debilitating arthritis involving his left hip.  He has COPD and is on home oxygen.  He is a English as a second language teacher as well.  He did wish to proceed with a total hip arthroplasty due to the debilitating nature of his left hip pain and well documented evidence of severe end-stage osteoarthritis of that hip.  We had to get medical and cardiac clearance for this surgery. Now he presents for definitive fixation.  He understands the significant difficulty of this surgery based on his body habitus.  I did have a thorough examination of him in the office and felt that we could get to his left hip safely.  PROCEDURE DESCRIPTION:  After informed consent was obtained, appropriate left hip was marked.  He was brought to the operating room and setup on a stretcher so spinal anesthesia could be obtained.  He was then laid supine on the stretcher.  A Foley catheter  was placed and both feet had traction boots applied to them.  Next, he was placed supine on the Hana fracture table, the perineal post in place, and both legs in the Inline skeletal traction devices, but no traction applied.  His left operative hip was then prepped and draped with DuraPrep and sterile drapes.  A time-out was called to identify correct patient, correct left hip.  We then made an incision just inferior and posterior to the anterior- superior iliac spine and carried this obliquely down the leg.  We dissected down tensor fascia lata muscle and tensor fascia was then divided longitudinally, so we could proceed with a direct anterior approach to the hip.  We identified and cauterized the circumflex vessels and identified the hip capsule.  I opened up the hip capsule finding a large joint effusion and significant periarticular osteophytes.  We placed Cobra retractors around the medial and lateral femoral neck within the joint capsule and made our femoral neck cut with an oscillating saw proximal to the lesser trochanter and completed this on osteotome.  We placed a corkscrew guide in the femoral head and removed the femoral heads in its entirety,  found to be devoid of cartilage.  We then placed a bent Hohmann over the medial acetabular rim and removed remnants of the acetabular labrum.  We then began reaming under direct visualization from a size 43 reamer up to a size 54.  The last reamer was placed also under direct fluoroscopy and so then we chose a size 54 acetabular component.  We put it under direct fluoroscopy as well as visualization, but I just did not feel that I got a good purchase of this.  Given the sclerotic bone, I felt that we needed to go upsize without needing to ream.  I removed the size 54 acetabular component.  We went to size 56 and we placed this and hammered it into place and it was nice and tight fit but I still placed 3 screws.  I then placed a size 36+  0 neutral polyethylene liner for a size 56 acetabular component.  Attention was then turned to the femur. With the leg externally rotated to 120 degrees, extended and adducted, we were able to place a Mueller retractor medially and a Hohmann retractor behind the greater trochanter.  We released the lateral joint capsule and used a box cutting osteotome in the inner femoral canal and a rongeur to lateralize.  We then began broaching using the Corail broaching system starting with a size 8 up to a size 12.  With the size 12 in place, we trialed a varus offset femoral neck and a 36+ 1.5 hip ball.  We rolled leg back over and up with traction and rotation reducing the pelvis.  We were pleased with leg length, offset, and stability.  We then dislocated the hip and removed the trial components. We were able to place the real Corail femoral component size 12 with varus offset and the real 36+ 1.5 ceramic hip ball and reduced this in the acetabulum.  We were pleased with stability, leg length, and offset as well as range of motion.  We then irrigated the soft tissue with normal saline solution using pulsatile lavage.  There was no joint capsule to close, so we closed the tensor fascia with #1 running Vicryl suture followed by 0 Vicryl in the deep tissue, 2-0 Vicryl in subcutaneous tissue, interrupted staples on the skin.  Xeroform well- padded sterile dressing and Aquacel were applied.  He was taken off the Hana table, taken to the recovery room in stable condition.  All final counts were correct.  There were no complications noted.  Of note, Erskine Emery, PA-C, assisted in the entire case.  His assistance was crucial for facilitating all aspects of this case.     Lind Guest. Ninfa Linden, M.D.     CYB/MEDQ  D:  02/10/2017  T:  02/10/2017  Job:  [id number]

## 2017-02-10 NOTE — Transfer of Care (Signed)
Immediate Anesthesia Transfer of Care Note  Patient: Ricky Johnston  Procedure(s) Performed: Procedure(s): LEFT TOTAL HIP ARTHROPLASTY ANTERIOR APPROACH (Left)  Patient Location: PACU  Anesthesia Type:Spinal  Level of Consciousness: awake, alert  and oriented  Airway & Oxygen Therapy: Patient Spontanous Breathing and Patient connected to face mask oxygen  Post-op Assessment: Report given to RN and Post -op Vital signs reviewed and stable  Post vital signs: Reviewed and stable  Last Vitals:  Vitals:   02/10/17 0720  BP: (!) 148/66  Pulse: 68  Resp: 20  Temp: 37.2 C    Last Pain:  Vitals:   02/10/17 0849  TempSrc:   PainSc: 4       Patients Stated Pain Goal: 4 (09/81/19 1478)  Complications: No apparent anesthesia complications

## 2017-02-11 DIAGNOSIS — N179 Acute kidney failure, unspecified: Secondary | ICD-10-CM

## 2017-02-11 DIAGNOSIS — R739 Hyperglycemia, unspecified: Secondary | ICD-10-CM

## 2017-02-11 LAB — CBC
HEMATOCRIT: 39.8 % (ref 39.0–52.0)
HEMOGLOBIN: 12.3 g/dL — AB (ref 13.0–17.0)
MCH: 29.2 pg (ref 26.0–34.0)
MCHC: 30.9 g/dL (ref 30.0–36.0)
MCV: 94.5 fL (ref 78.0–100.0)
Platelets: 163 10*3/uL (ref 150–400)
RBC: 4.21 MIL/uL — ABNORMAL LOW (ref 4.22–5.81)
RDW: 15.7 % — ABNORMAL HIGH (ref 11.5–15.5)
WBC: 6.3 10*3/uL (ref 4.0–10.5)

## 2017-02-11 LAB — COMPREHENSIVE METABOLIC PANEL
ALK PHOS: 77 U/L (ref 38–126)
ALT: 24 U/L (ref 17–63)
AST: 24 U/L (ref 15–41)
Albumin: 3.1 g/dL — ABNORMAL LOW (ref 3.5–5.0)
Anion gap: 7 (ref 5–15)
BILIRUBIN TOTAL: 0.6 mg/dL (ref 0.3–1.2)
BUN: 26 mg/dL — ABNORMAL HIGH (ref 6–20)
CO2: 29 mmol/L (ref 22–32)
CREATININE: 1.56 mg/dL — AB (ref 0.61–1.24)
Calcium: 8.4 mg/dL — ABNORMAL LOW (ref 8.9–10.3)
Chloride: 105 mmol/L (ref 101–111)
GFR calc non Af Amer: 45 mL/min — ABNORMAL LOW (ref >60)
GFR, EST AFRICAN AMERICAN: 52 mL/min — AB (ref >60)
Glucose, Bld: 175 mg/dL — ABNORMAL HIGH (ref 65–99)
Potassium: 4.9 mmol/L (ref 3.5–5.1)
Sodium: 141 mmol/L (ref 135–145)
Total Protein: 6.3 g/dL — ABNORMAL LOW (ref 6.5–8.1)

## 2017-02-11 LAB — GLUCOSE, CAPILLARY
GLUCOSE-CAPILLARY: 166 mg/dL — AB (ref 65–99)
GLUCOSE-CAPILLARY: 191 mg/dL — AB (ref 65–99)
Glucose-Capillary: 157 mg/dL — ABNORMAL HIGH (ref 65–99)
Glucose-Capillary: 255 mg/dL — ABNORMAL HIGH (ref 65–99)
Glucose-Capillary: 225 mg/dL — ABNORMAL HIGH (ref 65–99)

## 2017-02-11 LAB — BLOOD GAS, ARTERIAL
ACID-BASE EXCESS: 2.2 mmol/L — AB (ref 0.0–2.0)
BICARBONATE: 29.4 mmol/L — AB (ref 20.0–28.0)
Delivery systems: POSITIVE
Drawn by: 422461
EXPIRATORY PAP: 5
FIO2: 35
INSPIRATORY PAP: 16
LHR: 16 {breaths}/min
Mode: POSITIVE
O2 Saturation: 96.1 %
PATIENT TEMPERATURE: 99.6
PO2 ART: 93.6 mmHg (ref 83.0–108.0)
pCO2 arterial: 62.5 mmHg — ABNORMAL HIGH (ref 32.0–48.0)
pH, Arterial: 7.298 — ABNORMAL LOW (ref 7.350–7.450)

## 2017-02-11 NOTE — Discharge Instructions (Signed)

## 2017-02-11 NOTE — Evaluation (Signed)
Physical Therapy Evaluation Patient Details Name: Ricky Johnston MRN: [id number] DOB: 1951/05/01 Today's Date: 02/11/2017   History of Present Illness  s/p L DA THA; pt transferred to ICU after surgery--difficulty waking -- Got 1 amp of narcan w/ minimal response. ETCO2 in 90s. Was initially placed on facemask,  pt would awaken easily but was lethargic and would not talk. ETCO2 90s. Placed on NIPPV. PMHx: COPD, obesity, DVT  Clinical Impression  Pt is s/p THA resulting in the deficits listed below (see PT Problem List). * Pt will benefit from skilled PT to increase their independence and safety with mobility to allow discharge to the venue listed below.  Pt requiring +2 assist for bed mobility and tranfers as well as heavy encouragement today; feel he will progress and be able to D/C with HHPT; limited by pain today as well as dyspnea; will continue to follow; encouraged pt to be OOB, and perform ankle pumps, quad sets;  VS-- BP 119/58, 161/58, 157/57 O2 sats 83% on RA (brielfly) O2 replaced--sats >90% (pt uses 1L continuous O2 at home--he does take it off a lot per pt wife) HR 90s      Follow Up Recommendations Home health PT    Equipment Recommendations  None recommended by PT    Recommendations for Other Services       Precautions / Restrictions Precautions Precautions: Fall Restrictions Weight Bearing Restrictions: No Other Position/Activity Restrictions: WBAT      Mobility  Bed Mobility Overal bed mobility: Needs Assistance Bed Mobility: Supine to Sit     Supine to sit: Max assist;+2 for physical assistance;Mod assist;+2 for safety/equipment     General bed mobility comments: multi-modal cues for technique, sequence,  self assist and breathing  Transfers Overall transfer level: Needs assistance Equipment used: Rolling walker (2 wheeled) Transfers: Sit to/from [facility] Sit to Stand: Mod assist;+2 physical assistance;Max assist Stand pivot  transfers: Mod assist;+2 physical assistance;Max assist       General transfer comment: assist to rise and stabilize; mult-modal cues for hand placment, LLE position, safety  Ambulation/Gait             General Gait Details: stand pivot only   Stairs            Wheelchair Mobility    Modified Rankin (Stroke Patients Only)       Balance Overall balance assessment: Needs assistance   Sitting balance-Leahy Scale: Poor Sitting balance - Comments: posterior LOB with UE support Postural control: Posterior lean   Standing balance-Leahy Scale: Poor                               Pertinent Vitals/Pain Pain Assessment: 0-10 Pain Score: 9  Pain Location: left hip Pain Descriptors / Indicators: Sore Pain Intervention(s): Limited activity within patient's tolerance;Monitored during session;Premedicated before session;Repositioned;Ice applied    Home Living Family/patient expects to be discharged to:: Private residence Living Arrangements: Spouse/significant other Available Help at Discharge: Family Type of Home: House Home Access: Stairs to enter   Technical brewer of Steps: 3 Home Layout: One level Home Equipment: Wheelchair - [employer] - 2 wheels      Prior Function Level of Independence: Independent         Comments: recently using RW     Hand Dominance        Extremity/Trunk Assessment   Upper Extremity Assessment Upper Extremity Assessment: Defer to OT evaluation;Overall [facility] for tasks assessed  Lower Extremity Assessment Lower Extremity Assessment: LLE deficits/detail LLE Deficits / Details: ankle WFL; difficulty with hip/knee flexion in supined bil LEs d/t pain with movement and body habitus LLE: Unable to fully assess due to pain       Communication   Communication: No difficulties  Cognition Arousal/Alertness: Awake/alert Behavior During Therapy: Flat affect Overall Cognitive Status: Impaired/Different from  baseline Area of Impairment: Problem solving;Following commands       Following Commands: Follows one step commands with increased time     Problem Solving: Slow processing      General Comments      Exercises Total Joint Exercises Ankle Circles/Pumps: AROM;Both;10 reps Quad Sets: AROM;Both;5 reps   Assessment/Plan    PT Assessment Patient needs continued PT services  PT Problem List Decreased strength;Decreased range of motion;Decreased activity tolerance;Decreased balance;Decreased mobility;Decreased knowledge of use of DME;Pain       PT Treatment Interventions Gait training;DME instruction;Stair training;Functional mobility training;Therapeutic exercise;Therapeutic activities;Patient/family education    PT Goals (Current goals can be found in the Care Plan section)  Acute Rehab PT Goals Patient Stated Goal: less pain, get better PT Goal Formulation: With patient Time For Goal Achievement: 02/18/17 Potential to Achieve Goals: Good    Frequency 7X/week   Barriers to discharge        Co-evaluation               End of Session Equipment Utilized During Treatment: Gait belt Activity Tolerance: Patient limited by pain;Patient limited by fatigue Patient left: in chair;with call bell/phone within reach;with family/visitor present;with chair alarm set Nurse Communication: Mobility status PT Visit Diagnosis: Other abnormalities of gait and mobility (R26.89)         Time: 1791-5056 PT Time Calculation (min) (ACUTE ONLY): 31 min   Charges:   PT Evaluation $PT Eval Moderate Complexity: 1 Procedure PT Treatments $Therapeutic Activity: 8-22 mins   PT G Codes:         Ricky Johnston 02/26/2017, 11:29 AM

## 2017-02-11 NOTE — Progress Notes (Signed)
Subjective: 1 Day Post-Op Procedure(s) (LRB): LEFT TOTAL HIP ARTHROPLASTY ANTERIOR APPROACH (Left) Patient reports pain as moderate.  Looks much better overall then immediately post-op.  Acute on chronic lung issues.  Really appreciate the Critical Care Specialists/Pulmonology looking after him.  Objective: Vital signs in last 24 hours: Temp:  [98.2 F (36.8 C)-99.6 F (37.6 C)] 98.2 F (36.8 C) (03/17 1230) Pulse Rate:  [90-110] 94 (03/17 1300) Resp:  [12-26] 13 (03/17 1300) BP: (114-170)/(45-93) 114/59 (03/17 1300) SpO2:  [89 %-99 %] 90 % (03/17 1300) FiO2 (%):  [35 %] 35 % (03/16 2006)  Intake/Output from previous day: 03/16 0701 - 03/17 0700 In: 2795 [I.V.:2695; IV Piggyback:100] Out: 2100 [Urine:1700; Blood:400] Intake/Output this shift: Total I/O In: 1280 [P.O.:780; I.V.:450; IV Piggyback:50] Out: 600 [Urine:600]   Recent Labs  02/10/17 1743 02/11/17 0350  HGB 13.2 12.3*    Recent Labs  02/10/17 1743 02/11/17 0350  WBC 9.0 6.3  RBC 4.56 4.21*  HCT 43.3 39.8  PLT 153 163    Recent Labs  02/10/17 1743 02/11/17 0350  NA 140 141  K 5.4* 4.9  CL 105 105  CO2 29 29  BUN 29* 26*  CREATININE 1.50* 1.56*  GLUCOSE 163* 175*  CALCIUM 8.6* 8.4*   No results for input(s): LABPT, INR in the last 72 hours.  Sensation intact distally Intact pulses distally Dorsiflexion/Plantar flexion intact Incision: scant drainage  Assessment/Plan: 1 Day Post-Op Procedure(s) (LRB): LEFT TOTAL HIP ARTHROPLASTY ANTERIOR APPROACH (Left) Up with therapy  Can increase activities as stamina allows. Do not anticipate him going home until Monday at the earliest. Appreciate Critical Care's input greatly. Transition out of ICU as he improves to his baseline pulmonary status.   Mcarthur Rossetti 02/11/2017, 2:44 PM

## 2017-02-11 NOTE — Progress Notes (Signed)
[employer] called to check pt's home CPAP machine. 

## 2017-02-11 NOTE — Progress Notes (Signed)
PULMONARY / CRITICAL CARE MEDICINE   Name: Ricky Johnston MRN: [id number] DOB: 1951/11/09    ADMISSION DATE:  02/10/2017 CONSULTATION DATE:  3/16  REFERRING MD:  Conrad Houghton   CHIEF COMPLAINT:   Acute Hypercarbic respiratory failure.   HISTORY OF PRESENT ILLNESS:   This is a 66 year old male w/ sig h/o obesity, OSA, possible COPD. On CPAP at home. Presents to [facility] for elective left ORIF. Surgery uneventful. He was done under spinal w/ conscious sedation after OR was returned to PACU and slow to arouse. Got 1 amp of narcan w/ minimal response. ETCO2 in 90s. Was initially placed on facemask and nursing staff encouraged to stimulate the pt. On PCCM arrival pt would awaken easily but was lethargic and would not talk. ETCO2 90s. Placed on NIPPV.  SUBJECTIVE:  No events overnight, required BiPAP overnight.  Uses CPAP at home when asleep for OSA  VITAL SIGNS: BP (!) 145/60   Pulse 96   Temp 99.4 F (37.4 C) (Axillary)   Resp 17   Ht 5\' 8"  (1.727 m)   Wt 133.4 kg (294 lb)   SpO2 93%   BMI 44.70 kg/m   HEMODYNAMICS:    VENTILATOR SETTINGS: FiO2 (%):  [35 %] 35 %  INTAKE / OUTPUT: I/O last 3 completed shifts: In: 2795 [I.V.:2695; IV Piggyback:100] Out: 2100 [Urine:1700; Blood:400]  PHYSICAL EXAMINATION: General:  Obese 66 year old male. Awake and interactive  Neuro:  Alert and oriented x3, moving all ext to command. HEENT:  [place]/AT, PERRL, EOM-I and MMM Cardiovascular:  RRR, Nl S1/S2, -M/R/G Lungs: CTA bilaterally Abdomen:  Obese + bowel sounds Musculoskeletal:  Equal st and bulk, left hip dressing intact  Skin:  Warm and dry   LABS:  BMET  Recent Labs Lab 02/10/17 1743 02/11/17 0350  NA 140 141  K 5.4* 4.9  CL 105 105  CO2 29 29  BUN 29* 26*  CREATININE 1.50* 1.56*  GLUCOSE 163* 175*   Electrolytes  Recent Labs Lab 02/10/17 1743 02/11/17 0350  CALCIUM 8.6* 8.4*  MG 2.0  --   PHOS 4.7*  --    CBC  Recent Labs Lab 02/10/17 1743 02/11/17 0350  WBC 9.0 6.3   HGB 13.2 12.3*  HCT 43.3 39.8  PLT 153 163   Coag's No results for input(s): APTT, INR in the last 168 hours.  Sepsis Markers No results for input(s): LATICACIDVEN, PROCALCITON, O2SATVEN in the last 168 hours.  ABG  Recent Labs Lab 02/10/17 1509 02/10/17 1740 02/11/17 0421  PHART 7.181* 7.217* 7.298*  PCO2ART 82.8* 73.1* 62.5*  PO2ART 189* 81.7* 93.6   Liver Enzymes  Recent Labs Lab 02/11/17 0350  AST 24  ALT 24  ALKPHOS 77  BILITOT 0.6  ALBUMIN 3.1*   Cardiac Enzymes No results for input(s): TROPONINI, PROBNP in the last 168 hours.  Glucose  Recent Labs Lab 02/10/17 0910 02/10/17 1736 02/10/17 1954 02/10/17 2344 02/11/17 0331 02/11/17 0840  GLUCAP 117* 157* 192* 161* 166* 157*    Imaging Dg Chest Port 1 View  Result Date: 02/10/2017 CLINICAL DATA:  Shortness of breath EXAM: PORTABLE CHEST 1 VIEW COMPARISON:  01/08/2015 FINDINGS: 1628 hours. Low volumes. The cardio pericardial silhouette is enlarged. There is pulmonary vascular congestion without overt pulmonary edema. Probable underlying component of interstitial edema. The visualized bony structures of the thorax are intact. Telemetry leads overlie the chest. IMPRESSION: Cardiomegaly with vascular congestion and probable component of interstitial pulmonary edema. Electronically Signed   By: Misty Stanley  M.D.   On: 02/10/2017 17:17   Dg C-arm 61-120 Min-no Report  Result Date: 02/10/2017 Fluoroscopy was utilized by the requesting physician.  No radiographic interpretation.   Dg Hip Operative Unilat W Or W/o Pelvis Left  Result Date: 02/10/2017 CLINICAL DATA:  Imaging provided for left hip arthroplasty. EXAM: DG C-ARM 61-120 MIN-NO REPORT; OPERATIVE LEFT HIP WITH PELVIS COMPARISON:  None. FLUOROSCOPY TIME:  Fluoroscopy Time:  30 seconds Number of Acquired Spot Images: 2 FINDINGS: Submitted images show placement of a total left hip arthroplasty. The femoral and acetabular components appear well seated and  aligned. There is no acute fracture or evidence of an operative complication. IMPRESSION: Well-positioned left hip total arthroplasty. Electronically Signed   By: Lajean Manes M.D.   On: 02/10/2017 12:31     STUDIES:    CULTURES:   ANTIBIOTICS:     LINES/TUBES:  I reviewed CXR myself, no acute disease noted.  DISCUSSION:   ASSESSMENT / PLAN:  Acute on Chronic Hypercarbic Respiratory failure in setting of residual sedating medications superimposed on underlying OSA/OHS, COPD and chronic scaring from prior PNAs (followed at [facility] -baseline CO2 calculates around 50; on 1 liters O2 24/7 Plan May transfer out of the ICU, will defer to surgery Change BiPAP to CPAP (home one available, wife brought it in). 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  s/p ORIF left hip Plan Per orth  h/o FESO4 def anemia Plan Ck post-op CBC Routine anticoagulation   GERD Plan PPI  DM Plan   CBG  SSI  FAMILY  - Updates: patient and wife updated bedside  Discussed with PCCM-NP  PCCM will sign off, please call back if needed.  Rush Farmer, M.D. [facility] Pulmonary/Critical Care Medicine. Pager: [phone]. After hours pager: [phone]

## 2017-02-12 LAB — CBC
HEMATOCRIT: 33.5 % — AB (ref 39.0–52.0)
Hemoglobin: 10.4 g/dL — ABNORMAL LOW (ref 13.0–17.0)
MCH: 29.1 pg (ref 26.0–34.0)
MCHC: 31 g/dL (ref 30.0–36.0)
MCV: 93.8 fL (ref 78.0–100.0)
PLATELETS: 143 10*3/uL — AB (ref 150–400)
RBC: 3.57 MIL/uL — ABNORMAL LOW (ref 4.22–5.81)
RDW: 15.6 % — AB (ref 11.5–15.5)
WBC: 6.2 10*3/uL (ref 4.0–10.5)

## 2017-02-12 LAB — GLUCOSE, CAPILLARY
GLUCOSE-CAPILLARY: 150 mg/dL — AB (ref 65–99)
Glucose-Capillary: 150 mg/dL — ABNORMAL HIGH (ref 65–99)
Glucose-Capillary: 164 mg/dL — ABNORMAL HIGH (ref 65–99)
Glucose-Capillary: 176 mg/dL — ABNORMAL HIGH (ref 65–99)
Glucose-Capillary: 183 mg/dL — ABNORMAL HIGH (ref 65–99)
Glucose-Capillary: 188 mg/dL — ABNORMAL HIGH (ref 65–99)
Glucose-Capillary: 200 mg/dL — ABNORMAL HIGH (ref 65–99)

## 2017-02-12 MED ORDER — PANTOPRAZOLE SODIUM 40 MG PO TBEC
40.0000 mg | DELAYED_RELEASE_TABLET | Freq: Two times a day (BID) | ORAL | Status: DC
  Administered 2017-02-12 – 2017-02-14 (×5): 40 mg via ORAL
  Filled 2017-02-12 (×5): qty 1

## 2017-02-12 NOTE — Progress Notes (Signed)
     Subjective: 2 Days Post-Op Procedure(s) (LRB): LEFT TOTAL HIP ARTHROPLASTY ANTERIOR APPROACH (Left) Awake, alert and oriented x 4. HOB up No dyspnea. Left leg weak in flexion, moderate discomfort. Patient reports pain as moderate.    Objective:   VITALS:  Temp:  [98.2 F (36.8 C)-100.3 F (37.9 C)] 99 F (37.2 C) (03/18 0500) Pulse Rate:  [90-99] 93 (03/18 0600) Resp:  [12-23] 23 (03/18 0600) BP: (103-160)/(48-80) 115/59 (03/18 0600) SpO2:  [90 %-95 %] 91 % (03/18 0600)  Neurologically intact ABD soft Neurovascular intact Sensation intact distally Intact pulses distally Dorsiflexion/Plantar flexion intact Incision: scant drainage   LABS  Recent Labs  02/10/17 1743 02/11/17 0350 02/12/17 0319  HGB 13.2 12.3* 10.4*  WBC 9.0 6.3 6.2  PLT 153 163 143*    Recent Labs  02/10/17 1743 02/11/17 0350  NA 140 141  K 5.4* 4.9  CL 105 105  CO2 29 29  BUN 29* 26*  CREATININE 1.50* 1.56*  GLUCOSE 163* 175*   No results for input(s): LABPT, INR in the last 72 hours.   Assessment/Plan: 2 Days Post-Op Procedure(s) (LRB): LEFT TOTAL HIP ARTHROPLASTY ANTERIOR APPROACH (Left)  Respiratory insufficiency, obesity, chronic COPD and acute  Affects of anesthesia, surgery, size. narcotics and recumbancy causing acute exacerbation of respiratory insufficiency. Anemia, normal expected post surgical blood loss.   Advance diet Up with therapy  Await medicine's okay for transfer from unit. Currently on O2 per nasal cannula, may be able to use  Tank with tubing in PT with ambulation to improve distance up. Was out of bed to recliner yesterday. Anticoag for antiDVT prophylaxis.   Jessy Oto 02/12/2017, 10:15 AM

## 2017-02-12 NOTE — Progress Notes (Signed)
Physical Therapy Treatment Patient Details Name: Ricky Johnston MRN: [id number] DOB: 16-Apr-1951 Today's Date: 02/12/2017    History of Present Illness s/p L DA THA; pt transferred to ICU after surgery--difficulty waking -- Got 1 amp of narcan w/ minimal response. ETCO2 in 90s. Was initially placed on facemask,  pt would awaken easily but was lethargic and would not talk. ETCO2 90s. Placed on NIPPV. PMHx: COPD, obesity, DVT    PT Comments    Pt progressing slowly, limited by fatigue, pain and obesity.   Follow Up Recommendations  Home health PT     Equipment Recommendations  None recommended by PT    Recommendations for Other Services OT consult     Precautions / Restrictions Precautions Precautions: Fall Restrictions Weight Bearing Restrictions: No Other Position/Activity Restrictions: WBAT    Mobility  Bed Mobility Overal bed mobility: Needs Assistance Bed Mobility: Supine to Sit     Supine to sit: Mod assist;+2 for physical assistance;+2 for safety/equipment     General bed mobility comments: multi-modal cues for technique, sequence,  self assist and breathing  Transfers Overall transfer level: Needs assistance Equipment used: Rolling walker (2 wheeled) Transfers: Sit to/from [facility] Sit to Stand: +2 physical assistance;Mod assist;Max assist Stand pivot transfers: From elevated surface;Mod assist;Max assist       General transfer comment: assist to rise and stabilize; mult-modal cues for hand placment, LLE position, safety.  Physical assist for balance, support, RW management and to advance L LE  Ambulation/Gait             General Gait Details: stand pivot only    Stairs            Wheelchair Mobility    Modified Rankin (Stroke Patients Only)       Balance Overall balance assessment: Needs assistance Sitting-balance support: No upper extremity supported Sitting balance-Leahy Scale: Fair       Standing  balance-Leahy Scale: Poor                      Cognition Arousal/Alertness: Awake/alert Behavior During Therapy: Flat affect Overall Cognitive Status: Within Functional Limits for tasks assessed                      Exercises Total Joint Exercises Ankle Circles/Pumps: AROM;Both;10 reps Quad Sets: AROM;Both;5 reps Heel Slides: AAROM;Left;15 reps;Supine Hip ABduction/ADduction: AAROM;Left;15 reps;Supine    General Comments        Pertinent Vitals/Pain Pain Assessment: 0-10 Pain Score: 8  Pain Location: left hip Pain Descriptors / Indicators: Sore Pain Intervention(s): Limited activity within patient's tolerance;Monitored during session;Premedicated before session;Ice applied    Home Living                      Prior Function            PT Goals (current goals can now be found in the care plan section) Acute Rehab PT Goals Patient Stated Goal: less pain, get better PT Goal Formulation: With patient Time For Goal Achievement: 02/18/17 Potential to Achieve Goals: Good Progress towards PT goals: Progressing toward goals    Frequency    7X/week      PT Plan Current plan remains appropriate    Co-evaluation             End of Session Equipment Utilized During Treatment: Gait belt Activity Tolerance: Patient limited by pain;Patient limited by fatigue Patient left: in chair;with call bell/phone within reach;with  family/visitor present;with chair alarm set Nurse Communication: Mobility status PT Visit Diagnosis: Other abnormalities of gait and mobility (R26.89)     Time: 9326-7124 PT Time Calculation (min) (ACUTE ONLY): 36 min  Charges:  $Therapeutic Exercise: 8-22 mins $Therapeutic Activity: 8-22 mins                    G Codes:       Bronc Brosseau March 03, 2017, 4:30 PM

## 2017-02-12 NOTE — Progress Notes (Signed)
Physical Therapy Treatment Patient Details Name: Ricky Johnston MRN: [id number] DOB: 10-22-1951 Today's Date: 02/12/2017    History of Present Illness s/p L DA THA; pt transferred to ICU after surgery--difficulty waking -- Got 1 amp of narcan w/ minimal response. ETCO2 in 90s. Was initially placed on facemask,  pt would awaken easily but was lethargic and would not talk. ETCO2 90s. Placed on NIPPV. PMHx: COPD, obesity, DVT    PT Comments    Pt having difficulty following cues and following through with minimal effort.  Pt ltd by fatigue, lethargy, pain and obesity. May need to consider change in DC plan if pt does not progress.  Follow Up Recommendations  Home health PT     Equipment Recommendations  None recommended by PT    Recommendations for Other Services OT consult     Precautions / Restrictions Precautions Precautions: Fall Restrictions Weight Bearing Restrictions: No Other Position/Activity Restrictions: WBAT    Mobility  Bed Mobility Overal bed mobility: Needs Assistance Bed Mobility: Supine to Sit     Supine to sit: Mod assist;+2 for physical assistance;+2 for safety/equipment     General bed mobility comments: Utilized lift back to bed  Transfers Overall transfer level: Needs assistance Equipment used: Rolling walker (2 wheeled) Transfers: Sit to/from Stand Sit to Stand: +2 physical assistance;Mod assist;Max assist Stand pivot transfers: From elevated surface;Mod assist;Max assist       General transfer comment: Attempted to stand from low recliner.  Pt requiring assist to scoot forward in chair and with attempt to stand, pt unable to achieve upright standing and returned to chair.  Pt slow processing cues and with limited effort on follow through.  For safety of pt and staff, mechanical lift employed to transfer pt back to bed.  Ambulation/Gait             General Gait Details: stand pivot only    Stairs            Wheelchair Mobility     Modified Rankin (Stroke Patients Only)       Balance Overall balance assessment: Needs assistance Sitting-balance support: No upper extremity supported Sitting balance-Leahy Scale: Fair       Standing balance-Leahy Scale: Poor                      Cognition Arousal/Alertness: Lethargic;Suspect due to medications Behavior During Therapy: Flat affect Overall Cognitive Status: Impaired/Different from baseline Area of Impairment: Problem solving;Following commands       Following Commands: Follows one step commands with increased time     Problem Solving: Slow processing      Exercises Total Joint Exercises Ankle Circles/Pumps: AROM;Both;10 reps Quad Sets: AROM;Both;5 reps Heel Slides: AAROM;Left;15 reps;Supine Hip ABduction/ADduction: AAROM;Left;15 reps;Supine    General Comments        Pertinent Vitals/Pain Pain Assessment: 0-10 Pain Score: 6  Pain Location: left hip Pain Descriptors / Indicators: Sore Pain Intervention(s): Limited activity within patient's tolerance;Monitored during session;Premedicated before session;Ice applied    Home Living                      Prior Function            PT Goals (current goals can now be found in the care plan section) Acute Rehab PT Goals Patient Stated Goal: less pain, get better PT Goal Formulation: With patient Time For Goal Achievement: 02/18/17 Potential to Achieve Goals: Good Progress towards PT goals: Not progressing toward  goals - comment (ltd by lethargy, fatigue and obesity)    Frequency    7X/week      PT Plan Current plan remains appropriate    Co-evaluation             End of Session Equipment Utilized During Treatment: Other (comment) (mechanical lift) Activity Tolerance: Patient limited by lethargy;Patient limited by fatigue Patient left: in bed;with call bell/phone within reach;with nursing/sitter in room Nurse Communication: Mobility status;Need for lift  equipment PT Visit Diagnosis: Other abnormalities of gait and mobility (R26.89)     Time: 8343-7357 PT Time Calculation (min) (ACUTE ONLY): 25 min  Charges:  $Therapeutic Exercise: 8-22 mins $Therapeutic Activity: 23-37 mins                    G Codes:       Ricky Johnston 2017-02-14, 4:38 PM

## 2017-02-12 NOTE — Progress Notes (Signed)
At [place] pt was very hard to awaken, requiring sternal rub, pt has had fever and has been in chair for 3 hrs earlier.  Wife states she thinks he is just exhausted. VS have not changed from his normal except fever was elevated earlier and prior RN giving tylenol at beginning of my shift.  Pt is now awakening and able to answer questions appropriately.  Still goes right back to sleep but is more alert than earlier assessment.  Will continue to monitor and have RT place cpap soon.  Jourdain Guay P Kandee Escalante

## 2017-02-13 LAB — CBC
HEMATOCRIT: 31.3 % — AB (ref 39.0–52.0)
HEMOGLOBIN: 10 g/dL — AB (ref 13.0–17.0)
MCH: 29.8 pg (ref 26.0–34.0)
MCHC: 31.9 g/dL (ref 30.0–36.0)
MCV: 93.2 fL (ref 78.0–100.0)
Platelets: 141 10*3/uL — ABNORMAL LOW (ref 150–400)
RBC: 3.36 MIL/uL — ABNORMAL LOW (ref 4.22–5.81)
RDW: 15.3 % (ref 11.5–15.5)
WBC: 7.1 10*3/uL (ref 4.0–10.5)

## 2017-02-13 LAB — BASIC METABOLIC PANEL
Anion gap: 5 (ref 5–15)
BUN: 26 mg/dL — AB (ref 6–20)
CALCIUM: 8 mg/dL — AB (ref 8.9–10.3)
CO2: 27 mmol/L (ref 22–32)
Chloride: 101 mmol/L (ref 101–111)
Creatinine, Ser: 1.42 mg/dL — ABNORMAL HIGH (ref 0.61–1.24)
GFR calc Af Amer: 58 mL/min — ABNORMAL LOW (ref >60)
GFR, EST NON AFRICAN AMERICAN: 50 mL/min — AB (ref >60)
GLUCOSE: 164 mg/dL — AB (ref 65–99)
Potassium: 4.3 mmol/L (ref 3.5–5.1)
Sodium: 133 mmol/L — ABNORMAL LOW (ref 135–145)

## 2017-02-13 LAB — GLUCOSE, CAPILLARY
GLUCOSE-CAPILLARY: 132 mg/dL — AB (ref 65–99)
GLUCOSE-CAPILLARY: 147 mg/dL — AB (ref 65–99)
GLUCOSE-CAPILLARY: 146 mg/dL — AB (ref 65–99)
Glucose-Capillary: 159 mg/dL — ABNORMAL HIGH (ref 65–99)
Glucose-Capillary: 187 mg/dL — ABNORMAL HIGH (ref 65–99)
Glucose-Capillary: 206 mg/dL — ABNORMAL HIGH (ref 65–99)

## 2017-02-13 NOTE — Progress Notes (Signed)
Subjective: 3 Days Post-Op Procedure(s) (LRB): LEFT TOTAL HIP ARTHROPLASTY ANTERIOR APPROACH (Left) Patient reports pain as moderate.  No complaints feels he is slowly improving.   Objective: Vital signs in last 24 hours: Temp:  [98.6 F (37 C)-101.7 F (38.7 C)] 99.8 F (37.7 C) (03/19 0314) Pulse Rate:  [84-104] 100 (03/19 0800) Resp:  [15-24] 21 (03/19 0800) BP: (109-166)/(26-81) 115/52 (03/19 0800) SpO2:  [90 %-96 %] 90 % (03/19 0800)  Intake/Output from previous day: 03/18 0701 - 03/19 0700 In: 2160 [P.O.:360; I.V.:1800] Out: 3000 [Urine:3000] Intake/Output this shift: Total I/O In: 150 [I.V.:150] Out: 175 [Urine:175]   Recent Labs  02/10/17 1743 02/11/17 0350 02/12/17 0319 02/13/17 0338  HGB 13.2 12.3* 10.4* 10.0*    Recent Labs  02/12/17 0319 02/13/17 0338  WBC 6.2 7.1  RBC 3.57* 3.36*  HCT 33.5* 31.3*  PLT 143* 141*    Recent Labs  02/10/17 1743 02/11/17 0350  NA 140 141  K 5.4* 4.9  CL 105 105  CO2 29 29  BUN 29* 26*  CREATININE 1.50* 1.56*  GLUCOSE 163* 175*  CALCIUM 8.6* 8.4*   No results for input(s): LABPT, INR in the last 72 hours.   Awake and alert. Appears comfortable. Left Hip/lower extremity: Neurovascular intact Dorsiflexion/Plantar flexion intact Incision: scant drainage Compartment soft  Assessment/Plan: 3 Days Post-Op Procedure(s) (LRB): LEFT TOTAL HIP ARTHROPLASTY ANTERIOR APPROACH (Left)  Chronic COPD on acute exacerbation of respiratory insufficiency. Now on nasal cannula.  Up with therapy  Transfer to ortho floor when appropriate from medical standpoint.  ABLA secondary to surgery will monitor for symptoms of anemia .  Joseth Weigel 02/13/2017, 8:53 AM

## 2017-02-13 NOTE — Progress Notes (Addendum)
Physical Therapy Treatment Patient Details Name: Ricky Johnston MRN: [id number] DOB: [birth year] Today's Date: 02/13/2017    History of Present Illness s/p L DA THA; pt transferred to ICU after surgery--difficulty waking -- Got 1 amp of narcan w/ minimal response. ETCO2 in 90s. Was initially placed on facemask,  pt would awaken easily but was lethargic and would not talk. ETCO2 90s. Placed on NIPPV. PMHx: COPD, obesity, DVT    PT Comments    +2 max assist for supine to sit and for sit to stand with RW. Pt unable to to stand fully erect with RW and unable to fully weight shift to lift either leg for attempted marching in standing. Performed THA exercises with assistance. Pt lethargic, likely due to pain medications. He reports new tingling in L foot, RN notified.  PT now recommending SNF due to slow progress with mobility.    Follow Up Recommendations  SNF     Equipment Recommendations  None recommended by PT    Recommendations for Other Services OT consult     Precautions / Restrictions Precautions Precautions: Fall Restrictions Weight Bearing Restrictions: No Other Position/Activity Restrictions: WBAT    Mobility  Bed Mobility Overal bed mobility: Needs Assistance Bed Mobility: Supine to Sit;Sit to Supine     Supine to sit: +2 for physical assistance;Max assist;HOB elevated Sit to supine: Max assist;+2 for physical assistance   General bed mobility comments: multi-modal cues for technique, sequence,  self assist and breathing  Transfers Overall transfer level: Needs assistance Equipment used: Rolling walker (2 wheeled) Transfers: Sit to/from Stand Sit to Stand: +2 physical assistance;Mod assist;Max assist;From elevated surface         General transfer comment: assist to rise and stabilize; mult-modal cues for hand placment, LLE position, safety.  Physical assist for balance, support, RW management. Pt stood for ~2 minutes with RW, attempted to march in place but pt  unable to lift either foot off of floor. Pt stood with trunk flexed despite frequent VCs to correct posture.   Ambulation/Gait             General Gait Details: stand pivot only    Stairs            Wheelchair Mobility    Modified Rankin (Stroke Patients Only)       Balance Overall balance assessment: Needs assistance Sitting-balance support: No upper extremity supported Sitting balance-Leahy Scale: Fair       Standing balance-Leahy Scale: Poor                      Cognition Arousal/Alertness: Lethargic;Suspect due to medications Behavior During Therapy: Flat affect Overall Cognitive Status: Impaired/Different from baseline Area of Impairment: Problem solving;Following commands       Following Commands: Follows one step commands with increased time     Problem Solving: Slow processing      Exercises Total Joint Exercises Ankle Circles/Pumps: AROM;Both;10 reps Short Arc Quad: AROM;Left;10 reps;Supine Heel Slides: AAROM;Left;Supine;10 reps Hip ABduction/ADduction: AAROM;Left;Supine;10 reps Long Arc Quad: AAROM;Left;10 reps;Seated (AROM x 10 RLE seated)    General Comments        Pertinent Vitals/Pain Pain Score: 7  Pain Location: left hip Pain Descriptors / Indicators: Sore Pain Intervention(s): Limited activity within patient's tolerance;Monitored during session;Premedicated before session;Ice applied    Home Living                      Prior Function  PT Goals (current goals can now be found in the care plan section) Acute Rehab PT Goals Patient Stated Goal: less pain, get better PT Goal Formulation: With patient/family Time For Goal Achievement: 02/18/17 Potential to Achieve Goals: Good Progress towards PT goals: Progressing toward goals    Frequency    7X/week      PT Plan Current plan remains appropriate    Co-evaluation             End of Session Equipment Utilized During Treatment:  Oxygen Activity Tolerance: Patient limited by pain;Patient limited by lethargy Patient left: in bed;with call bell/phone within reach;with family/visitor present;with nursing/sitter in room Nurse Communication: Mobility status;Need for lift equipment PT Visit Diagnosis: Other abnormalities of gait and mobility (R26.89);Difficulty in walking, not elsewhere classified (R26.2);Pain Pain - Right/Left: Left Pain - part of body: Hip     Time: 3403-7096 PT Time Calculation (min) (ACUTE ONLY): 40 min  Charges:  $Therapeutic Exercise: 8-22 mins $Therapeutic Activity: 23-37 mins                    G Codes:       Philomena Doheny 02/13/2017, 11:59 AM [phone]

## 2017-02-13 NOTE — Progress Notes (Signed)
Patient ID: Ricky Johnston, male   DOB: 16-Feb-1951, 66 y.o.   MRN: [id number] Just transferred him to the floor.  PT recommending skilled nursing.  Will consult social work.

## 2017-02-13 NOTE — Care Management Note (Signed)
Case Management Note  Patient Details  Name: Ricky Johnston MRN: [id number] Date of Birth: 01-Aug-1951  Subjective/Objective:     S/p l Hip arthroplasty, resp distress post op requiring bipap and o2 via [place].  Uses C-pap at home at night.               Action/Plan:Date:  February 13, 2017 Chart reviewed for concurrent status and case management needs. Will continue to follow patient progress. Discharge Planning: following for needs Expected discharge date: [id number] Velva Harman, BSN, [place], [place]   Expected Discharge Date:                  Expected Discharge Plan:  [place]  In-House Referral:  Clinical Social Work  Discharge planning Services  CM Consult  Post Acute Care Choice:    Choice offered to:     DME Arranged:    DME Agency:     HH Arranged:    [place] Agency:     Status of Service:  In process, will continue to follow  If discussed at Long Length of Stay Meetings, dates discussed:    Additional Comments:  Leeroy Cha, RN 02/13/2017, 10:20 AM

## 2017-02-13 NOTE — NC FL2 (Signed)
[place] LEVEL OF CARE SCREENING TOOL     IDENTIFICATION  Patient Name: Ricky Johnston Birthdate: 1951-09-30 Sex: male Admission Date (Current Location): 02/10/2017  [facility] and [place] Number:  Herbalist and Address:  [facility],  [place], [place]      Provider Number: [id number]  Attending Physician Name and Address:  Mcarthur Rossetti, *  Relative Name and Phone Number:       Current Level of Care: Hospital Recommended Level of Care: [place] Prior Approval Number:    Date Approved/Denied:   PASRR Number: [id number]  Discharge Plan: SNF    Current Diagnoses: Patient Active Problem List   Diagnosis Date Noted  . Unilateral primary osteoarthritis, left hip 02/10/2017  . 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  . H/O total hip arthroplasty, left 02/10/2017  . History of total hip arthroplasty 02/10/2017  . Respiratory failure ([place])   . OSA (obstructive sleep apnea)   . Pain in the chest   . Chronic systolic heart failure ([place])   . Esophageal reflux   . Chest pain 01/08/2015  . Insulin dependent diabetes mellitus ([place]) 01/08/2015  . COPD (chronic obstructive pulmonary disease) ([place]) 01/08/2015  . Hyperlipidemia associated with type 2 diabetes mellitus ([place]) 01/08/2015    Orientation RESPIRATION BLADDER Height & Weight     Self, Time, Situation, Place  O2, Other (Comment) (CPAP) Continent Weight: 294 lb (133.4 kg) Height:  5\' 8"  (172.7 cm)  BEHAVIORAL SYMPTOMS/MOOD NEUROLOGICAL BOWEL NUTRITION STATUS  Other (Comment) (no behaviors)   Continent Diet  AMBULATORY STATUS COMMUNICATION OF NEEDS Skin   Extensive Assist Verbally Surgical wounds                       Personal Care Assistance Level of Assistance  Bathing, Feeding, Dressing Bathing Assistance: Limited assistance Feeding assistance: Independent Dressing Assistance: Limited assistance     Functional Limitations  Info  Sight, Hearing, Speech Sight Info: Adequate Hearing Info: Adequate Speech Info: Adequate    SPECIAL CARE FACTORS FREQUENCY  PT (By licensed PT), OT (By licensed OT)     PT Frequency: 5x wk OT Frequency: 5x wk            Contractures Contractures Info: Not present    Additional Factors Info  Code Status, Allergies, Insulin Sliding Scale Code Status Info: Full Code             Current Medications (02/13/2017):  This is the current hospital active medication list Current Facility-Administered Medications  Medication Dose Route Frequency Provider Last Rate Last Dose  . 0.9 %  sodium chloride infusion   Intravenous Continuous Mcarthur Rossetti, MD 75 mL/hr at 02/13/17 0238    . acetaminophen (TYLENOL) tablet 650 mg  650 mg Oral Q6H PRN Mcarthur Rossetti, MD   650 mg at 02/12/17 1922   Or  . acetaminophen (TYLENOL) suppository 650 mg  650 mg Rectal Q6H PRN Mcarthur Rossetti, MD      . albuterol (PROVENTIL) (2.5 MG/3ML) 0.083% nebulizer solution 3 mL  3 mL Inhalation Q6H PRN Mcarthur Rossetti, MD      . alum & mag hydroxide-simeth (MAALOX/MYLANTA) 200-200-20 MG/5ML suspension 30 mL  30 mL Oral Q4H PRN Mcarthur Rossetti, MD      . arformoterol [facility]) nebulizer solution 15 mcg  15 mcg Nebulization BID Erick Colace, NP   15 mcg at 02/13/17 1012  . aspirin chewable  tablet 81 mg  81 mg Oral BID Mcarthur Rossetti, MD   81 mg at 02/13/17 0958  . atorvastatin (LIPITOR) tablet 80 mg  80 mg Oral QHS Mcarthur Rossetti, MD   80 mg at 02/12/17 2219  . budesonide (PULMICORT) nebulizer solution 0.5 mg  0.5 mg Nebulization BID Erick Colace, NP   0.5 mg at 02/13/17 1012  . cholecalciferol (VITAMIN D) tablet 1,000 Units  1,000 Units Oral Daily Mcarthur Rossetti, MD   1,000 Units at 02/13/17 0957  . diphenhydrAMINE (BENADRYL) 12.5 MG/5ML elixir 12.5-25 mg  12.5-25 mg Oral Q4H PRN Mcarthur Rossetti, MD   25 mg at 02/11/17 1813  . docusate  sodium (COLACE) capsule 100 mg  100 mg Oral BID Mcarthur Rossetti, MD   100 mg at 02/13/17 0957  . ferrous sulfate tablet 325 mg  325 mg Oral Daily Mcarthur Rossetti, MD   325 mg at 02/13/17 0958  . furosemide (LASIX) tablet 40 mg  40 mg Oral Daily Mcarthur Rossetti, MD   40 mg at 02/13/17 0957  . gabapentin (NEURONTIN) capsule 600 mg  600 mg Oral BID Mcarthur Rossetti, MD   600 mg at 02/13/17 0958  . HYDROmorphone (DILAUDID) injection 0.5 mg  0.5 mg Intravenous Q2H PRN Anders Simmonds, MD   0.5 mg at 02/13/17 1000  . insulin aspart (novoLOG) injection 0-15 Units  0-15 Units Subcutaneous Q4H Anders Simmonds, MD   2 Units at 02/13/17 1154  . insulin glargine (LANTUS) injection 20 Units  20 Units Subcutaneous BID Anders Simmonds, MD   20 Units at 02/13/17 [phone]  . losartan (COZAAR) tablet 25 mg  25 mg Oral Daily Mcarthur Rossetti, MD   25 mg at 02/13/17 0958  . menthol-cetylpyridinium (CEPACOL) lozenge 3 mg  1 lozenge Oral PRN Mcarthur Rossetti, MD       Or  . phenol (CHLORASEPTIC) mouth spray 1 spray  1 spray Mouth/Throat PRN Mcarthur Rossetti, MD      . metFORMIN (GLUCOPHAGE) tablet 1,000 mg  1,000 mg Oral BID WC Mcarthur Rossetti, MD   1,000 mg at 02/13/17 0803  . methocarbamol (ROBAXIN) tablet 500 mg  500 mg Oral Q6H PRN Mcarthur Rossetti, MD   500 mg at 02/12/17 1207   Or  . methocarbamol (ROBAXIN) 500 mg in dextrose 5 % 50 mL IVPB  500 mg Intravenous Q6H PRN Mcarthur Rossetti, MD   500 mg at 02/11/17 1549  . metoCLOPramide (REGLAN) tablet 5-10 mg  5-10 mg Oral Q8H PRN Mcarthur Rossetti, MD       Or  . metoCLOPramide (REGLAN) injection 5-10 mg  5-10 mg Intravenous Q8H PRN Mcarthur Rossetti, MD      . naloxone [facility]) injection 0.4 mg  0.4 mg Intravenous PRN Anders Simmonds, MD      . ondansetron [facility]) tablet 4 mg  4 mg Oral Q6H PRN Mcarthur Rossetti, MD       Or  . ondansetron [facility]) injection 4 mg  4 mg Intravenous Q6H PRN  Mcarthur Rossetti, MD      . oxybutynin (DITROPAN) tablet 5 mg  5 mg Oral Q6H PRN Mcarthur Rossetti, MD      . oxyCODONE (Oxy IR/ROXICODONE) immediate release tablet 5-10 mg  5-10 mg Oral Q3H PRN Mcarthur Rossetti, MD   10 mg at 02/13/17 0802  . pantoprazole (PROTONIX) EC tablet 40 mg  40 mg  Oral BID Mcarthur Rossetti, MD   40 mg at 02/13/17 0959  . rOPINIRole (REQUIP) tablet 0.5 mg  0.5 mg Oral QHS Mcarthur Rossetti, MD   0.5 mg at 02/12/17 2219  . tamsulosin (FLOMAX) capsule 0.4 mg  0.4 mg Oral Daily Mcarthur Rossetti, MD   0.4 mg at 02/13/17 3532     Discharge Medications: Please see discharge summary for a list of discharge medications.  Relevant Imaging Results:  Relevant Lab Results:   Additional Information SS # [id number]  Derrious Bologna, Randall An, LCSW

## 2017-02-13 NOTE — Progress Notes (Signed)
Patient wore his home CPAP on Sat night and a few hours last night. Patients sats started going in the mid 80's and was still hard to wake up. RT placed BIPAP on patient . Patient seems more alert and is resting comfortably.

## 2017-02-13 NOTE — Progress Notes (Signed)
Patient ID: Ricky Johnston, male   DOB: 07/25/1951, 66 y.o.   MRN: [id number] [name] to transfer to med/surg floor.

## 2017-02-14 LAB — GLUCOSE, CAPILLARY
GLUCOSE-CAPILLARY: 195 mg/dL — AB (ref 65–99)
Glucose-Capillary: 202 mg/dL — ABNORMAL HIGH (ref 65–99)

## 2017-02-14 MED ORDER — ASPIRIN 81 MG PO CHEW: 81.0000 mg | CHEWABLE_TABLET | Freq: Two times a day (BID) | ORAL | 0 refills | Status: DC

## 2017-02-14 MED ORDER — INSULIN ASPART 100 UNIT/ML ~~LOC~~ SOLN
0.0000 [IU] | Freq: Three times a day (TID) | SUBCUTANEOUS | Status: DC
  Administered 2017-02-14: 5 [IU] via SUBCUTANEOUS
  Administered 2017-02-14: 3 [IU] via SUBCUTANEOUS

## 2017-02-14 MED ORDER — OXYCODONE-ACETAMINOPHEN 5-325 MG PO TABS: 1.0000 | ORAL_TABLET | ORAL | 0 refills | Status: DC | PRN

## 2017-02-14 MED ORDER — METHOCARBAMOL 500 MG PO TABS: 500.0000 mg | ORAL_TABLET | Freq: Four times a day (QID) | ORAL | 0 refills | Status: DC | PRN

## 2017-02-14 NOTE — Clinical Social Work Note (Signed)
Clinical Social Work Assessment  Patient Details  Name: Ricky Johnston MRN: [id number] Date of Birth: May 12, 1951  Date of referral:  02/14/17               Reason for consult:  Discharge Planning                Permission sought to share information with:  Chartered certified accountant granted to share information::  Yes, Verbal Permission Granted  Name::        Agency::     Relationship::     Contact Information:     Housing/Transportation Living arrangements for the past 2 months:  Single Family Home Source of Information:  Patient, Spouse Patient Interpreter Needed:  None Criminal Activity/Legal Involvement Pertinent to Current Situation/Hospitalization:  No - Comment as needed Significant Relationships:  Spouse Lives with:  Spouse Do you feel safe going back to the place where you live?   (SNF recommended.) Need for family participation in patient care:  Yes (Comment)  Care giving concerns: Pt's care cannot be managed at home following hospital d/c.   Social Worker assessment / plan:  Pt hospitalized on 02/10/17 for pre planned total hip arthroplasty. PT has recommended ST Rehab at d/c. CSW met with pt / spouse to assist with d/c planning. Pt / spouse are in agreement wit rehab and SNF search initiated and bed offers provided. Pt will d/c to [facility] ( [facility] ) today.   Employment status:  Retired Forensic scientist:  Medicare PT Recommendations:  [place] / Referral to community resources:  [place]  Patient/Family's Response to care:  Pt / spouse are in agreement with [employer] placement.  Patient/Family's Understanding of and Emotional Response to Diagnosis, Current Treatment, and Prognosis:  Pt / spouse are aware of pt's medical status. Pt is motivated to work with PT.  Emotional Assessment Appearance:  Appears stated age Attitude/Demeanor/Rapport:  Other (cooperative) Affect (typically observed):   Appropriate Orientation:  Oriented to Self, Oriented to Place, Oriented to  Time, Oriented to Situation Alcohol / Substance use:  Not Applicable Psych involvement (Current and /or in the community):  No (Comment)  Discharge Needs  Concerns to be addressed:  Discharge Planning Concerns Readmission within the last 30 days:  No Current discharge risk:  None Barriers to Discharge:  No Barriers Identified   Luretha Rued, [place] 02/14/2017, 11:59 AM

## 2017-02-14 NOTE — Progress Notes (Signed)
Physical Therapy Treatment Patient Details Name: Ricky Johnston MRN: [id number] DOB: [birth year] Today's Date: 02/14/2017    History of Present Illness s/p L DA THA; pt transferred to ICU after surgery--difficulty waking -- Got 1 amp of narcan w/ minimal response. ETCO2 in 90s. Was initially placed on facemask,  pt would awaken easily but was lethargic and would not talk. ETCO2 90s. Placed on NIPPV. PMHx: COPD, obesity, DVT    PT Comments    Pt progressing slowly, continue to recommend SNF; O2 sats decr to 81% when briefly on RA, requiring 1.5-2L O2 to maintain O2 sats >92%; pt reports his sats run in the 70s in the morning at home; He continues to require incr assist for basic mobility tasks and firm instruction to participate; his pain remains "10/10" even after pain meds; pt with diminished core strength and this appears to be greatly limiting even the most basic tasks(difficulty sitting EOB without UE support and difficulty standing, pt with continued attempts to lean on RW)  Follow Up Recommendations  SNF     Equipment Recommendations  None recommended by PT    Recommendations for Other Services       Precautions / Restrictions Precautions Precautions: Fall Restrictions Weight Bearing Restrictions: No Other Position/Activity Restrictions: WBAT    Mobility  Bed Mobility Overal bed mobility: Needs Assistance Bed Mobility: Supine to Sit     Supine to sit: Mod assist;HOB elevated;+2 for safety/equipment     General bed mobility comments: multi-modal cues for technique, sequence,  self assist and breathing; pt sleeps in recliner at home  Transfers Overall transfer level: Needs assistance Equipment used: Rolling walker (2 wheeled) Transfers: Sit to/from Stand Sit to Stand: From elevated surface;Min assist;+2 physical assistance;+2 safety/equipment            Ambulation/Gait Ambulation/Gait assistance: Min assist;+2 physical assistance;+2 safety/equipment Ambulation  Distance (Feet): 6 Feet (steps forward and back to chair) Assistive device: Rolling walker (2 wheeled) Gait Pattern/deviations: Step-to pattern;Decreased stance time - left     General Gait Details: cues for sequence, Rw position, posture, breathing; assist to advance LLE   Stairs            Wheelchair Mobility    Modified Rankin (Stroke Patients Only)       Balance             Standing balance-Leahy Scale: Poor                      Cognition Arousal/Alertness: Awake/alert Behavior During Therapy: Flat affect           Following Commands: Follows one step commands with increased time     Problem Solving: Slow processing;Requires verbal cues;Requires tactile cues      Exercises Total Joint Exercises Ankle Circles/Pumps: AROM;Both;10 reps Quad Sets: AROM;Strengthening;Both;10 reps Heel Slides: AAROM;Supine;10 reps;Right;Limitations Heel Slides Limitations: unable on Right LE d/t pain    General Comments        Pertinent Vitals/Pain Pain Assessment: 0-10 Pain Score: 10-Worst pain ever Pain Location: left hip Pain Descriptors / Indicators: Sore Pain Intervention(s): Limited activity within patient's tolerance;Monitored during session;Repositioned    Home Living                      Prior Function            PT Goals (current goals can now be found in the care plan section) Acute Rehab PT Goals Patient Stated Goal: less pain, get better  PT Goal Formulation: With patient/family Time For Goal Achievement: 02/18/17 Potential to Achieve Goals: Good Progress towards PT goals: Progressing toward goals (slowly)    Frequency    7X/week      PT Plan Current plan remains appropriate    Co-evaluation             End of Session Equipment Utilized During Treatment: Oxygen;Gait belt Activity Tolerance: No increased pain;Patient limited by fatigue Patient left: in chair;with call bell/phone within reach   PT Visit  Diagnosis: Other abnormalities of gait and mobility (R26.89);Difficulty in walking, not elsewhere classified (R26.2);Pain Pain - Right/Left: Left Pain - part of body: Hip     Time: 1003-1020 PT Time Calculation (min) (ACUTE ONLY): 17 min  Charges:  $Therapeutic Activity: 8-22 mins                    G Codes:       Hazel Wrinkle 2017-02-21, 12:12 PM

## 2017-02-14 NOTE — Progress Notes (Signed)
Patient ID: Ricky Johnston, male   DOB: [birth year], 65 y.o.   MRN: [id number] Has slowly progressed.  Vitals stable.  Can be discharged to skilled nursing this afternoon if bed available.

## 2017-02-14 NOTE — Discharge Summary (Signed)
Patient ID: Ricky Johnston MRN: [id number] DOB/AGE: 66-22-1952 66 y.o.  Admit date: 02/10/2017 Discharge date: 02/14/2017  Admission Diagnoses:  Principal Problem:   Unilateral primary osteoarthritis, left hip Active Problems:   Status post left hip replacement   H/O total hip arthroplasty, left   History of total hip arthroplasty   Respiratory failure (HCC)   OSA (obstructive sleep apnea)   Discharge Diagnoses:  Same  Past Medical History:  Diagnosis Date  . Arthritis   . Cancer [facility])    bladder cancer currently 2016  . Complication of anesthesia    hard for him to wake up from Anesthesia from left nephrectomy  . COPD (chronic obstructive pulmonary disease) ([place])   . Diabetes mellitus without complication ([place])   . DVT (deep venous thrombosis) ([place]) 01/08/2014, 2010   upper extremity  . GERD (gastroesophageal reflux disease)   . Hallux limitus 05/18/2015   from notes from [place]   . Headache    migraines  . History of kidney stones   . Hypercholesterolemia   . Hypertension   . Impaired hearing   . Intervertebral disc syndrome   . Kidney stones   . Pulmonary fibrosis ([place])   . Renal calculi 01/08/2014   frrom noted from [facility] .in chart  . Restless legs 01/08/2014  . Sciatic leg pain    paralysis of sciatic nerve  . Sleep apnea    CPAP/BIPAP  . Tinnitus     Surgeries: Procedure(s): LEFT TOTAL HIP ARTHROPLASTY ANTERIOR APPROACH on 02/10/2017   Consultants:   Discharged Condition: Improved  Hospital Course: Ricky Johnston is an 66 y.o. male who was admitted 02/10/2017 for operative treatment ofUnilateral primary osteoarthritis, left hip. Patient has severe unremitting pain that affects sleep, daily activities, and work/hobbies. After pre-op clearance the patient was taken to the operating room on 02/10/2017 and underwent  Procedure(s): LEFT TOTAL HIP ARTHROPLASTY ANTERIOR APPROACH.    Patient was given perioperative antibiotics: Anti-infectives    Start      Dose/Rate Route Frequency Ordered Stop   02/10/17 1800  ceFAZolin (ANCEF) IVPB 2g/100 mL premix     2 g 200 mL/hr over 30 Minutes Intravenous Every 6 hours 02/10/17 1745 02/11/17 0045   02/10/17 0600  ceFAZolin (ANCEF) 3 g in dextrose 5 % 50 mL IVPB     3 g 130 mL/hr over 30 Minutes Intravenous On call to O.R. 02/09/17 1352 02/10/17 0955       Patient was given sequential compression devices, early ambulation, and chemoprophylaxis to prevent DVT.  Patient benefited maximally from hospital stay and there were no complications.  He did require a stay in the ICU post-op as well as a Pulmonology consult due to his COPD and oxygen requirements.  He transitioned out of the ICU on post-op day #3 without issues.  Recent vital signs: Patient Vitals for the past 24 hrs:  BP Temp Temp src Pulse Resp SpO2  02/14/17 0610 117/61 98 F (36.7 C) Oral 91 20 94 %  02/14/17 0119 - - - 88 20 91 %  02/13/17 2300 - - - 88 20 92 %  02/13/17 2158 (!) 110/49 98.8 F (37.1 C) Oral 91 17 94 %  02/13/17 1950 - - - - - 93 %  02/13/17 0900 - - - 88 18 92 %  02/13/17 0800 (!) 115/52 97.5 F (36.4 C) Axillary 100 (!) 21 90 %     Recent laboratory studies:  Recent Labs  02/12/17 0319 02/13/17 0338 02/13/17 1242  WBC 6.2 7.1  --   HGB 10.4* 10.0*  --   HCT 33.5* 31.3*  --   PLT 143* 141*  --   NA  --   --  133*  K  --   --  4.3  CL  --   --  101  CO2  --   --  27  BUN  --   --  26*  CREATININE  --   --  1.42*  GLUCOSE  --   --  164*  CALCIUM  --   --  8.0*     Discharge Medications:   Allergies as of 02/14/2017      Reactions   Poison Ivy Extract [poison Ivy Extract] Rash   Poison Oak Extract Rash      Medication List    STOP taking these medications   traMADol 50 MG tablet Commonly known as:  ULTRAM     TAKE these medications   acetaminophen 325 MG tablet Commonly known as:  TYLENOL Take 3 tablets (975 mg total) by mouth every 8 (eight) hours as needed for mild pain. What changed:   when to take this   albuterol 108 (90 Base) MCG/ACT inhaler Commonly known as:  PROVENTIL HFA;VENTOLIN HFA Inhale 2 puffs into the lungs every 6 (six) hours as needed for wheezing or shortness of breath.   ammonium lactate 12 % lotion Commonly known as:  LAC-HYDRIN Apply 1 application topically daily as needed for dry skin.   aspirin 81 MG chewable tablet Chew 1 tablet (81 mg total) by mouth 2 (two) times daily after a meal. What changed:  when to take this   atorvastatin 80 MG tablet Commonly known as:  LIPITOR Take 80 mg by mouth at bedtime.   budesonide-formoterol 160-4.5 MCG/ACT inhaler Commonly known as:  SYMBICORT Inhale 2 puffs into the lungs 2 (two) times daily.   cetirizine 10 MG tablet Commonly known as:  ZYRTEC Take 10 mg by mouth daily as needed for allergies.   cholecalciferol 1000 units tablet Commonly known as:  VITAMIN D Take 1,000 Units by mouth daily.   ferrous sulfate 325 (65 FE) MG tablet Take 325 mg by mouth daily.   furosemide 20 MG tablet Commonly known as:  LASIX Take 40 mg by mouth daily.   gabapentin 300 MG capsule Commonly known as:  NEURONTIN Take 600 mg by mouth 2 (two) times daily.   insulin aspart 100 UNIT/ML injection Commonly known as:  novoLOG Inject 30-82 Units into the skin 3 (three) times daily before meals. 50 units at breakfast. 30 units at lunch 82 units at bedtime   insulin glargine 100 UNIT/ML injection Commonly known as:  LANTUS Inject 50 Units into the skin 2 (two) times daily.   losartan 25 MG tablet Commonly known as:  COZAAR Take 25 mg by mouth daily.   metFORMIN 1000 MG tablet Commonly known as:  GLUCOPHAGE Take 1,000 mg by mouth 2 (two) times daily with a meal.   methocarbamol 500 MG tablet Commonly known as:  ROBAXIN Take 1 tablet (500 mg total) by mouth every 6 (six) hours as needed for muscle spasms.   omeprazole 20 MG capsule Commonly known as:  PRILOSEC Take 20 mg by mouth 2 (two) times daily before a  meal.   oxybutynin 5 MG tablet Commonly known as:  DITROPAN Take 5 mg by mouth every 6 (six) hours as needed for bladder spasms.   oxyCODONE-acetaminophen 5-325 MG tablet Commonly known as:  ROXICET  Take 1-2 tablets by mouth every 4 (four) hours as needed.   OXYGEN Inhale 1 L into the lungs continuous. At night time may increase to 2 L as needed for shortness of breath   rOPINIRole 0.5 MG tablet Commonly known as:  REQUIP Take 0.5 mg by mouth at bedtime.   SPIRIVA HANDIHALER 18 MCG inhalation capsule Generic drug:  tiotropium Place 18 mcg into inhaler and inhale daily.   SUPER B COMPLEX/C PO Take 1 tablet by mouth 2 (two) times daily.   tamsulosin 0.4 MG Caps capsule Commonly known as:  FLOMAX Take 0.4 mg by mouth daily.       Diagnostic Studies: Dg Chest Port 1 View  Result Date: 02/10/2017 CLINICAL DATA:  Shortness of breath EXAM: PORTABLE CHEST 1 VIEW COMPARISON:  01/08/2015 FINDINGS: 1628 hours. Low volumes. The cardio pericardial silhouette is enlarged. There is pulmonary vascular congestion without overt pulmonary edema. Probable underlying component of interstitial edema. The visualized bony structures of the thorax are intact. Telemetry leads overlie the chest. IMPRESSION: Cardiomegaly with vascular congestion and probable component of interstitial pulmonary edema. Electronically Signed   By: Misty Stanley M.D.   On: 02/10/2017 17:17   Dg C-arm 61-120 Min-no Report  Result Date: 02/10/2017 Fluoroscopy was utilized by the requesting physician.  No radiographic interpretation.   Dg Hip Operative Unilat W Or W/o Pelvis Left  Result Date: 02/10/2017 CLINICAL DATA:  Imaging provided for left hip arthroplasty. EXAM: DG C-ARM 61-120 MIN-NO REPORT; OPERATIVE LEFT HIP WITH PELVIS COMPARISON:  None. FLUOROSCOPY TIME:  Fluoroscopy Time:  30 seconds Number of Acquired Spot Images: 2 FINDINGS: Submitted images show placement of a total left hip arthroplasty. The femoral and  acetabular components appear well seated and aligned. There is no acute fracture or evidence of an operative complication. IMPRESSION: Well-positioned left hip total arthroplasty. Electronically Signed   By: Lajean Manes M.D.   On: 02/10/2017 12:31    Disposition: skilled nursing  Discharge Instructions    Discharge patient    Complete by:  As directed    Discharge disposition:  03-Skilled [place]   Discharge patient date:  02/14/2017      Follow-up Information    Mcarthur Rossetti, MD Follow up in 2 week(s).   Specialty:  Orthopedic Surgery Contact information: [place] [address] [phone]            Signed: Mcarthur Rossetti 02/14/2017, 7:31 AM

## 2017-02-14 NOTE — Clinical Social Work Placement (Signed)
   CLINICAL SOCIAL WORK PLACEMENT  NOTE  Date:  02/14/2017  Patient Details  Name: Wyndell Cardiff MRN: [id number] Date of Birth: 1951-01-18  Clinical Social Work is seeking post-discharge placement for this patient at the [place] level of care (*CSW will initial, date and re-position this form in  chart as items are completed):  Yes   Patient/family provided with [facility] Work Department's list of facilities offering this level of care within the geographic area requested by the patient (or if unable, by the patient's family).  Yes   Patient/family informed of their freedom to choose among providers that offer the needed level of care, that participate in Medicare, Medicaid or managed care program needed by the patient, have an available bed and are willing to accept the patient.  Yes   Patient/family informed of [facility]'s ownership interest in [facility] and [facility], as well as of the fact that they are under no obligation to receive care at these facilities.  PASRR submitted to EDS on 02/13/17     PASRR number received on 02/13/17     Existing PASRR number confirmed on       FL2 transmitted to all facilities in geographic area requested by pt/family on 02/13/17     FL2 transmitted to all facilities within larger geographic area on       Patient informed that his/her managed care company has contracts with or will negotiate with certain facilities, including the following:        Yes   Patient/family informed of bed offers received.  Patient chooses bed at [place], [place]     Physician recommends and patient chooses bed at      Patient to be transferred to [place], [place] on 02/14/17.  Patient to be transferred to facility by [facility]     Patient family notified on 02/14/17 of transfer.  Name of family member notified:  spouse     PHYSICIAN       Additional Comment: Pt / spouse are in agreement with plan  for dc to [facility] ( [facility] ) today. [facility] transport is required. Medical necessity form completed. Pt / spouse are aware out of pocket cots may be associated with [facility] transport. D/C Summary sent to SNF for review. Scripts included in d/c packet. # for report provided to nsg.   _______________________________________________ Luretha Rued, [place]  [phone] 02/14/2017, 12:19 PM

## 2017-02-14 NOTE — Progress Notes (Signed)
   02/14/17 1400  PT Visit Information  Last PT Received On 02/14/17  Assistance Needed +2  History of Present Illness s/p L DA THA; pt transferred to ICU after surgery--difficulty waking -- Got 1 amp of narcan w/ minimal response. ETCO2 in 90s. Was initially placed on facemask,  pt would awaken easily but was lethargic and would not talk. ETCO2 90s. Placed on NIPPV. PMHx: COPD, obesity, DVT  Subjective Data  Patient Stated Goal less pain, get better  Precautions  Precautions Fall  Restrictions  Other Position/Activity Restrictions WBAT  Pain Assessment  Pain Assessment 0-10  Pain Score 10  Pain Location left hip  Pain Descriptors / Indicators Sore  Pain Intervention(s) Limited activity within patient's tolerance;Monitored during session;Repositioned  Cognition  Arousal/Alertness Awake/alert  Behavior During Therapy Flat affect  Following Commands Follows one step commands with increased time  Problem Solving Slow processing;Requires verbal cues;Requires tactile cues  Bed Mobility  Overal bed mobility Needs Assistance  Bed Mobility Sit to Supine  Sit to supine Min assist;Mod assist  General bed mobility comments multi-modal cues for technique, sequence,  self assist and breathing; pt sleeps in recliner at home  Transfers  Overall transfer level Needs assistance  Equipment used Rolling walker (2 wheeled)  Transfers Sit to/from [employer] Transfers  Sit to Stand Mod assist;+2 physical assistance;+2 safety/equipment (from chair)  Stand pivot transfers Mod assist;+2 physical assistance;Min assist  General transfer comment assist to rise and stabilize; mult-modal cues for hand placment, LLE position, safety.  Physical assist for balance, support, RW management. Pt stood for ~2 minutes with RW, attempted to march in place but pt unable to lift either foot off of floor. Pt stood with trunk flexed despite frequent VCs to correct posture.   PT - End of Session  Equipment Utilized  During Treatment Oxygen;Gait belt  Activity Tolerance Patient limited by fatigue;Patient limited by pain  Patient left with call bell/phone within reach;in bed;with bed alarm set;with family/visitor present  PT - Assessment/Plan  PT Plan Current plan remains appropriate  PT Visit Diagnosis Other abnormalities of gait and mobility (R26.89);Difficulty in walking, not elsewhere classified (R26.2);Pain  Pain - Right/Left Left  Pain - part of body Hip  PT Frequency (ACUTE ONLY) 7X/week  Follow Up Recommendations SNF  PT equipment None recommended by PT  AM-PAC PT "6 Clicks" Daily Activity Outcome Measure  Difficulty turning over in bed (including adjusting bedclothes, sheets and blankets)? 2  Difficulty moving from lying on back to sitting on the side of the bed?  2  Difficulty sitting down on and standing up from a chair with arms (e.g., wheelchair, bedside commode, etc,.)? 2  Help needed moving to and from a bed to chair (including a wheelchair)? 2  Help needed walking in hospital room? 1  Help needed climbing 3-5 steps with a railing?  1  6 Click Score 10  Mobility G Code  CL  Acute Rehab PT Goals  PT Goal Formulation With patient/family  Time For Goal Achievement 02/18/17  Potential to Achieve Goals Good  PT Time Calculation  PT Start Time (ACUTE ONLY) 1345  PT Stop Time (ACUTE ONLY) 1403  PT Time Calculation (min) (ACUTE ONLY) 18 min  PT General Charges  $$ ACUTE PT VISIT 1 Procedure  PT Treatments  $Therapeutic Activity 8-22 mins

## 2017-02-14 NOTE — Care Management Important Message (Signed)
Important Message  Patient Details  Name: Willmer Fellers MRN: [id number] Date of Birth: 07/17/1951   Medicare Important Message Given:  Yes    Kerin Salen 02/14/2017, 10:51 AMImportant Message  Patient Details  Name: Jermie Hippe MRN: [id number] Date of Birth: Apr 02, 1951   Medicare Important Message Given:  Yes    Kerin Salen 02/14/2017, 10:51 AM

## 2017-02-15 ENCOUNTER — Encounter (HOSPITAL_COMMUNITY): Payer: Self-pay | Admitting: Orthopaedic Surgery

## 2017-02-23 ENCOUNTER — Ambulatory Visit (INDEPENDENT_AMBULATORY_CARE_PROVIDER_SITE_OTHER): Payer: Medicare Other | Admitting: Orthopaedic Surgery

## 2017-02-23 DIAGNOSIS — Z96642 Presence of left artificial hip joint: Secondary | ICD-10-CM

## 2017-02-23 NOTE — Progress Notes (Signed)
The patient is almost 2 weeks status post a left total hip arthroplasty through direct injury approach. He stated nursing facility due to his obesity and his oxygen demands. The pupils are facility or with him and stated there is working on steps Hopefully will get discharge next week. He is doing well overall.  On examination his left hip incision looks good. I removed the staples and placed Steri-Strips. I've encouraged him to keep a cough or dry gauze his groin crease to keep the incision dry.  His leg lengths feel equal. He tolerated hip and is at the range of motion.  I'll see him back in a month's he is doing overall but no x-rays are needed.

## 2017-03-23 ENCOUNTER — Ambulatory Visit (INDEPENDENT_AMBULATORY_CARE_PROVIDER_SITE_OTHER): Payer: Medicare Other | Admitting: Orthopaedic Surgery

## 2017-03-23 DIAGNOSIS — Z96642 Presence of left artificial hip joint: Secondary | ICD-10-CM

## 2017-03-23 NOTE — Progress Notes (Signed)
Patient is now 7 weeks status post a left total hip arthroplasty. He still ambulate with a rolling walker and has home therapy still come into his house. This was due to severe deconditioning before his surgery as well. He is somewhat is also morbidly obese.  On examination I can easily put his left hip the range of motion is not hurting him at all. He just weak in general around the sole body. There still a small area of opening at the very top of his incision but does not appear to be infected. They're only just keeping it dry.  At this point I want him to wash it daily with soap and I'll soapy water and then dry the area well. I'll then have them put a small amount of mupirocin ointment on the wound daily after shower. I'll see him back in a month to see how is doing overall. If home health therapy does suggest outpatient therapy he will give [place] a call because we can set this up to [facility]'s outpatient therapy in [place] at [facility].

## 2017-04-20 ENCOUNTER — Ambulatory Visit (INDEPENDENT_AMBULATORY_CARE_PROVIDER_SITE_OTHER): Payer: Medicare Other | Admitting: Orthopaedic Surgery

## 2017-04-20 DIAGNOSIS — Z96642 Presence of left artificial hip joint: Secondary | ICD-10-CM

## 2017-04-20 NOTE — Progress Notes (Signed)
The patient is 6 weeks status post a left total hip arthroplasty. He says he is doing well. He is ambulating with a cane. He says his pain is significantly less than what it was preoperative. He has been having some right hip pain though. He points to the lateral aspect of his right hip as source of pain. Denies any right groin pain.  On examination his right hip only has pain of the trochanteric area to palpation with fluid range of motion of the hip itself which is fine and no groin pain. His left hip is moving excellently. I did review his x-rays from his last visit and I see no arthritic changes on the right side at all. I do feel that the right side is more of a trochanteric bursitis. I was able to show her stretching exercises to try.  At this point I don't really need to see him back for 6 months. I like a low AP pelvis at that visit.

## 2017-06-25 IMAGING — RF DG HIP (WITH PELVIS) OPERATIVE*L*
1 series · 2 of 2 positions shown · non-contrast
Comparison: None.

CLINICAL DATA: Imaging provided for left hip arthroplasty.

EXAM:
DG C-ARM 61-120 MIN-NO REPORT; OPERATIVE LEFT HIP WITH PELVIS

[Series 1: run · 2 of 2 slices shown]
[im 1/2]
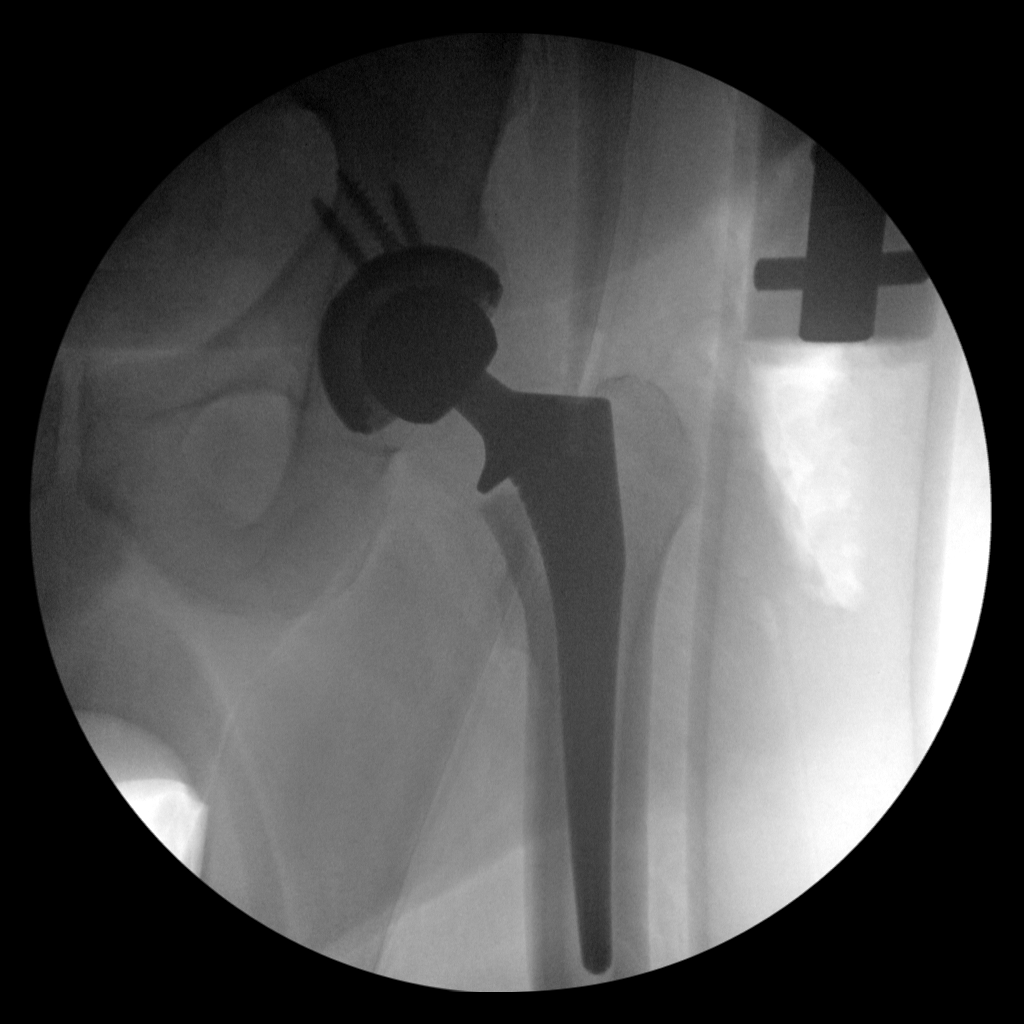
[im 2/2]
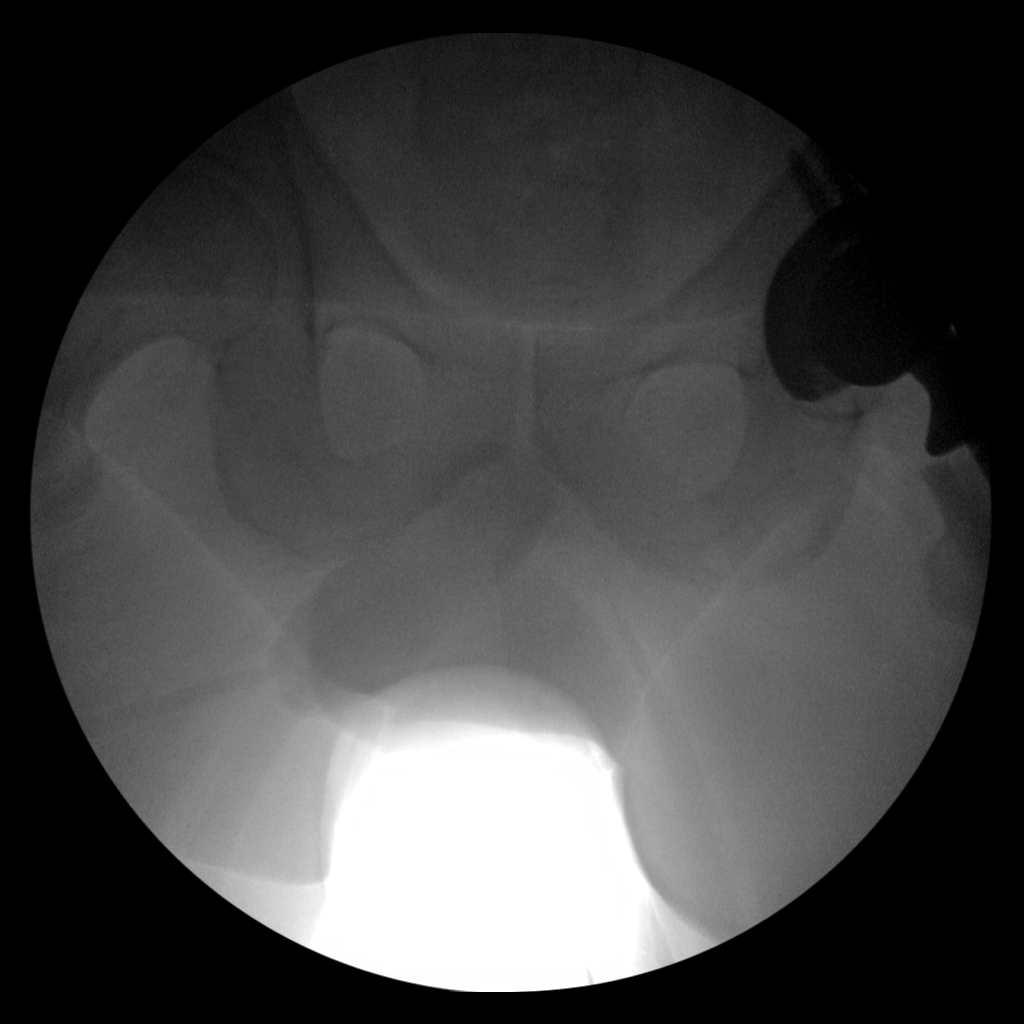

[2 of 2 positions shown; findings below may reference images not displayed]

FLUOROSCOPY TIME:  Fluoroscopy Time:  30 seconds

Number of Acquired Spot Images: 2
FINDINGS: Submitted images show placement of a total left hip arthroplasty.
The femoral and acetabular components appear well seated and
aligned. There is no acute fracture or evidence of an operative
complication.
IMPRESSION: Well-positioned left hip total arthroplasty.

## 2017-10-18 ENCOUNTER — Other Ambulatory Visit (INDEPENDENT_AMBULATORY_CARE_PROVIDER_SITE_OTHER): Payer: Self-pay

## 2017-10-18 ENCOUNTER — Ambulatory Visit (INDEPENDENT_AMBULATORY_CARE_PROVIDER_SITE_OTHER): Payer: Medicare Other

## 2017-10-18 ENCOUNTER — Ambulatory Visit (INDEPENDENT_AMBULATORY_CARE_PROVIDER_SITE_OTHER): Payer: Medicare Other | Admitting: Orthopaedic Surgery

## 2017-10-18 ENCOUNTER — Encounter (INDEPENDENT_AMBULATORY_CARE_PROVIDER_SITE_OTHER): Payer: Self-pay | Admitting: Orthopaedic Surgery

## 2017-10-18 DIAGNOSIS — Z96642 Presence of left artificial hip joint: Secondary | ICD-10-CM

## 2017-10-18 DIAGNOSIS — M25552 Pain in left hip: Secondary | ICD-10-CM

## 2017-10-18 NOTE — Progress Notes (Signed)
The patient is now 8 months status post a left total hip arthroplasty through direct anterior approach.  He is still ambulating with a cane and having problems with lifting his leg as well as balance and coordination.  He is an obese individual as well he said he is doing well in terms of his hip and as it relates to pain.  On exam I can put both hips to internal extra rotation with no difficulty at all.  His incisions are well-healed.  He does have leg weakness in general.  X-rays of the pelvis and hip show well-seated and plan the left side with no comp gating features.  There is no arthritic changes on the right side.  At this point I do feel he would benefit from formal outpatient physical therapy to work on his gait balance coordination with looking his proprioception and strengthening his bilateral lower extremities.  There will be no hip precautions as well and he can weight-bear as tolerated.  I like to see him back in 3 months daily doing overall but no x-rays are needed.  All questions concerns were answered and addressed.

## 2017-10-25 ENCOUNTER — Encounter (HOSPITAL_COMMUNITY): Payer: Self-pay

## 2017-10-25 ENCOUNTER — Ambulatory Visit (HOSPITAL_COMMUNITY): Payer: Medicare Other | Attending: Orthopaedic Surgery

## 2017-10-25 DIAGNOSIS — M25552 Pain in left hip: Secondary | ICD-10-CM | POA: Diagnosis present

## 2017-10-25 DIAGNOSIS — M25551 Pain in right hip: Secondary | ICD-10-CM | POA: Diagnosis present

## 2017-10-25 DIAGNOSIS — R2681 Unsteadiness on feet: Secondary | ICD-10-CM

## 2017-10-25 DIAGNOSIS — M6281 Muscle weakness (generalized): Secondary | ICD-10-CM

## 2017-10-25 NOTE — Therapy (Signed)
[facility] [place], [place], [address] Phone: [phone]   Fax:  [phone]  Physical Therapy Evaluation  Patient Details  Name: Ricky Johnston MRN: [id number] Date of Birth: 66/17/1952 Referring Provider: Jean Rosenthal, MD   Encounter Date: 10/25/2017  PT End of Session - 10/25/17 1627    Visit Number  1    Number of Visits  13    Date for PT Re-Evaluation  11/15/17    Authorization Type  Medicare; [employer]    Authorization Time Period  10/25/17 to 12/06/17    Authorization - Visit Number  1    Authorization - Number of Visits  10    PT Start Time  7673    PT Stop Time  1606    PT Time Calculation (min)  45 min    Equipment Utilized During Treatment  Gait belt    Activity Tolerance  Patient tolerated treatment well;No increased pain    Behavior During Therapy  WFL for tasks assessed/performed       Past Medical History:  Diagnosis Date  . Arthritis   . Cancer [facility])    bladder cancer currently 2016  . Complication of anesthesia    hard for him to wake up from Anesthesia from left nephrectomy  . COPD (chronic obstructive pulmonary disease) ([place])   . Diabetes mellitus without complication ([place])   . DVT (deep venous thrombosis) ([place]) 01/08/2014, 2010   upper extremity  . GERD (gastroesophageal reflux disease)   . Hallux limitus 05/18/2015   from notes from [place]   . Headache    migraines  . History of kidney stones   . Hypercholesterolemia   . Hypertension   . Impaired hearing   . Intervertebral disc syndrome   . Kidney stones   . Pulmonary fibrosis ([place])   . Renal calculi 01/08/2014   frrom noted from [facility] .in chart  . Restless legs 01/08/2014  . Sciatic leg pain    paralysis of sciatic nerve  . Sleep apnea    CPAP/BIPAP  . Tinnitus     Past Surgical History:  Procedure Laterality Date  . ABDOMINAL SURGERY    . BACK SURGERY    . BLADDER REPAIR    . EYE SURGERY    . HERNIA REPAIR     . LEFT HEART CATHETERIZATION WITH CORONARY ANGIOGRAM N/A 01/09/2015   Procedure: LEFT HEART CATHETERIZATION WITH CORONARY ANGIOGRAM;  Surgeon: Clent Demark, MD;  Location: [facility] CATH LAB;  Service: Cardiovascular;  Laterality: N/A;  . NEPHROURETERECTOMY  03/18/2016   with bladder cuff excision- from noted in chart from [facility]  . TOTAL HIP ARTHROPLASTY Left 02/10/2017   Procedure: LEFT TOTAL HIP ARTHROPLASTY ANTERIOR APPROACH;  Surgeon: Mcarthur Rossetti, MD;  Location: [facility] ORS;  Service: Orthopedics;  Laterality: Left;    There were no vitals filed for this visit.   Subjective Assessment - 10/25/17 1526    Subjective  Pt reports undergoing a L THA on 02/10/17 by Dr. Ninfa Linden. He reports having rehab in a SNF following and HHPT, both of which went well. He states that both hips started hurting during therapy and have been hurting ever since. He states that his balance has been off since his L THA. He reports 2-3 falls in the last 6 months, which he states occurred when he turned around or went down the stairs. He states balance is his main issue. He is currently ambulating with a SPC but was  not using one prior to his L THA. He reports having neuropathy in both of his legs, all the way up.     Pertinent History  current bladder cancer and undergoing treatments, on O2 ("6L when walking long distances"), COPD, DM, neuropathy, HTN, pulmonary fibrosis, arthritis    Limitations  Walking    How long can you sit comfortably?  no issues    How long can you stand comfortably?  no issues    How long can you walk comfortably?  no issues with SPC    Patient Stated Goals  get rid of SPC    Currently in Pain?  Yes    Pain Score  3     Pain Location  Hip    Pain Orientation  Right    Pain Descriptors / Indicators  Dull;Sharp    Pain Type  Chronic pain    Pain Onset  More than a month ago    Pain Frequency  Constant    Aggravating Factors   unsure    Pain Relieving Factors  heat    Effect of Pain  on Daily Activities  slows him down         [facility] PT Assessment - 10/25/17 0001      Assessment   Medical Diagnosis  Pain in L hip    Referring Provider  Jean Rosenthal, MD    Onset Date/Surgical Date  -- following L THA in March 2018    Next MD Visit  -- unsure, about 3 months    Prior Therapy  rehab and HHPT for L THA back in March      Precautions   Precautions  None    Precaution Comments  per Dr. Ninfa Linden, no hip precautions and WBAT      Balance Screen   Has the patient fallen in the past 6 months  Yes    How many times?  2-3    Has the patient had a decrease in activity level because of a fear of falling?   No    Is the patient reluctant to leave their home because of a fear of falling?   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    AROM Assessment Site  Hip      PROM   Overall PROM Comments  unable to objectively measure hip extension as pt cannot lay on stomach, but grossly deficient based on anterior pelvic tilt/increased hip flexion in standing.    Right Hip  External Rotation   -- WFL, non-painful    Right Hip Internal  Rotation   -- min restrictsions, non-painful    Left Hip Flexion  -- ~90-100 deg, body comp could be limiting, normal end feel    Left Hip External Rotation   -- WFL, non-painful    Left Hip Internal Rotation   -- min to mod restrictions, non-painful      Strength   Right Hip Flexion  5/5    Right Hip Extension  -- functionally weak    Right Hip External Rotation   5/5    Right Hip Internal Rotation  5/5    Right Hip ABduction  4/5    Left Hip Flexion  4+/5    Left Hip Extension  -- functionally weak    Left Hip External Rotation  4+/5    Left Hip Internal Rotation  4+/5    Left Hip ABduction  4/5    Right Knee Flexion  5/5    Right Knee Extension  5/5    Left Knee Flexion  5/5    Left Knee Extension  5/5    Right Ankle Dorsiflexion  4/5    Left Ankle Dorsiflexion  4-/5      Right Hip   Right Hip Flexion  -- PROM ~90-100 deg,body comp could be limiting,normal end feel      Flexibility   Soft Tissue Assessment /Muscle Length  yes    Hamstrings  L tighter than R in 90/90      Palpation   Palpation comment  increased soft tissue restrictions and tenderness to palpation on R glutes and piriformis especially at insertion near greater trochanter, L glutes and piriformis (min to no tenderness at greater trochanter)      Ambulation/Gait   Ambulation Distance (Feet)  50 Feet around gym    Assistive device  Straight cane    Gait Pattern  Step-through pattern;Decreased step length - right;Decreased step length - left;Decreased hip/knee flexion - right;Decreased hip/knee flexion - left;Antalgic;Trendelenburg;Wide base of support    Gait Comments  antalgic on L      6 Minute Walk- Baseline   6 Minute Walk- Baseline  -- perform 3MWT next session      Balance   Balance Assessed  Yes      Static Standing Balance   Static Standing - Balance Support  No upper extremity supported    Static Standing Balance -  Activities   Single Leg Stance - Right Leg;Single Leg Stance - Left Leg     Static Standing - Comment/# of Minutes  R: 3 sec or < with no UE, L: 3 sec or < with 1 UE supoprt      Standardized Balance Assessment   Standardized Balance Assessment  Five Times Sit to Stand;Dynamic Gait Index;Timed Up and Go Test    Five times sit to stand comments   14.7 sec, no UE      Dynamic Gait Index   Level Surface  Mild Impairment    Change in Gait Speed  Moderate Impairment    Gait with Horizontal Head Turns  Mild Impairment    Gait with Vertical Head Turns  Mild Impairment    Gait and Pivot Turn  Normal    Step Over Obstacle  Moderate Impairment    Step Around Obstacles  Normal    Steps  Mild Impairment    Total Score  16      Timed Up and Go Test  Normal TUG (seconds)  -- to be performed next visit           Objective measurements completed on examination: See above findings.       PT Education - 10/25/17 1626    Education provided  Yes    Education Details  exam findings, POC, HEP, will continue objective testing next session, will initiate HEP next session    Person(s) Educated  Patient    Methods  Explanation    Comprehension  Verbalized understanding       PT Short Term Goals - 10/25/17 1643      PT SHORT TERM GOAL #1   Title  Pt will be independent with HEP and perform consistently in order to maximize return to PLOF.    Time  3    Period  Weeks    Status  New    Target Date  11/15/17      PT SHORT TERM GOAL #2   Title  Pt will have 1/2 grade improvement in MMT of all muscle groups tested in order to maximize gait and balance.    Time  3    Period  Weeks    Status  New      PT SHORT TERM GOAL #3   Title  Pt will be able to perform 5xSTS in 10 sec or < with no UE to demo improved functional BLE strength and decrease risk for falls.    Time  3    Period  Weeks    Status  New      PT SHORT TERM GOAL #4   Title  Pt will be active in regular walking program 3x/week or > to maximize strength and overall QOL.    Time  3    Period  Weeks     Status  New        PT Long Term Goals - 10/25/17 1647      PT LONG TERM GOAL #1   Title  Pt will have a 1 grade improvement in MMT of all muscle groups tested in order to decrease pain, maximize gait, and decrease risk for falls.     Time  6    Period  Weeks    Status  New    Target Date  12/06/17      PT LONG TERM GOAL #2   Title  Pt will be able to perform bil SLS for at least 6 sec or > with no UE to demo improved balance and functional strength of BLE in order to decrease his risk for falls and allow him to ambulate safely with LRAD.    Time  6    Period  Weeks    Status  New      PT LONG TERM GOAL #3   Title  Pt will score at least 20/24 on the DGI to demo improved overall balance and decrease his risk for falling.    Time  6    Period  Weeks    Status  New      PT LONG TERM GOAL #4   Title  Pt will be able to perform TUG with LRAD in 12 sec or < to demo improved functional mobility and overall balance.    Time  6    Period  Weeks    Status  New      PT LONG TERM GOAL #5   Title  Pt will have improved 3MWT by 177ft or > with  LRAD to demo improved functional strength, endurance, and balance in order to maximize community access and participation.    Time  6    Period  Weeks    Status  New             Plan - 10/25/17 1630    Clinical Impression Statement  Pt is pleasant 66 YO M who presents to OPPT with c/o bil hip pain. Pt had L THA on 02/10/17 and states that both hips began hurting during his rehab for the L hip. He currently presents with deficits in proximal hip strength, balance, gait, and overall functional strength and mobility, as well as increased soft tissue restrictions and mild deficits in hip ROM. Pt scored 16/24 on the DGI  and he required 14.7 sec to perform 5xSTS, indicating he is at increased risks for falls. Pt needs skilled PT intervention to address these impairments in order to decrease risk for falls and maximize overall QOL. Pt on O2  throughout entire session.    History and Personal Factors relevant to plan of care:  current bladder cancer and undergoing treatments, on O2 ("6L when walking long distances"), COPD, DM, neuropathy, HTN, pulmonary fibrosis, arthritis    Clinical Presentation  Stable    Clinical Presentation due to:  in proximal hip strength, balance, gait, and overall functional strength and mobility, increased soft tissue restrictions and mild deficits in hip ROM    Clinical Decision Making  Low    Rehab Potential  Fair    PT Frequency  2x / week    PT Duration  6 weeks    PT Treatment/Interventions  ADLs/Self Care Home Management;Cryotherapy;Electrical Stimulation;Moist Heat;DME Instruction;Gait training;Stair training;Functional mobility training;Therapeutic activities;Therapeutic exercise;Balance training;Neuromuscular re-education;Patient/family education;Manual techniques;Scar mobilization;Passive range of motion;Dry needling;Energy conservation;Taping    PT Next Visit Plan  review goals, perform TUG and 3MWT, initiate HEP; functional hip strengthening, balance, manual for soft tissue restrictions    PT Home Exercise Plan  initiate next visit    Consulted and Agree with Plan of Care  Patient       Patient will benefit from skilled therapeutic intervention in order to improve the following deficits and impairments:  Abnormal gait, Cardiopulmonary status limiting activity, Decreased activity tolerance, Decreased balance, Decreased endurance, Decreased mobility, Decreased range of motion, Decreased strength, Difficulty walking, Hypomobility, Increased fascial restricitons, Increased muscle spasms, Impaired flexibility, Impaired sensation, Improper body mechanics, Obesity, Pain  Visit Diagnosis: Pain in left hip - Plan: PT plan of care cert/re-cert  Pain in right hip - Plan: PT plan of care cert/re-cert  Muscle weakness (generalized) - Plan: PT plan of care cert/re-cert  Unsteadiness on feet - Plan: PT  plan of care cert/re-cert  G-Codes - 37/48/27 1702    Functional Assessment Tool Used (Outpatient Only)  clinical judgement, MMT, 5xSTS, SLS, DGI    Functional Limitation  Mobility: Walking and moving around    Mobility: Walking and Moving Around Current Status ([id number])  At least 40 percent but less than 60 percent impaired, limited or restricted    Mobility: Walking and Moving Around Goal Status [phone])  At least 20 percent but less than 40 percent impaired, limited or restricted        Problem List Patient Active Problem List   Diagnosis Date Noted  . Unilateral primary osteoarthritis, left hip 02/10/2017  . 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  . H/O total hip arthroplasty, left 02/10/2017  . History of total hip arthroplasty 02/10/2017  . Respiratory failure ([place])   .  OSA (obstructive sleep apnea)   . Pain in the chest   . Chronic systolic heart failure ([place])   . Esophageal reflux   . Chest pain 01/08/2015  . Insulin dependent diabetes mellitus ([place]) 01/08/2015  . COPD (chronic obstructive pulmonary disease) ([place]) 01/08/2015  . Hyperlipidemia associated with type 2 diabetes mellitus ([place]) 01/08/2015      Geraldine Solar PT, DPT  [facility] [address] [place], [place], [address] Phone: [phone]   Fax:  [phone]  Name: Layton Naves MRN: [id number] Date of Birth: Jul 23, 1951

## 2017-10-26 ENCOUNTER — Ambulatory Visit (HOSPITAL_COMMUNITY): Payer: Medicare Other | Admitting: Physical Therapy

## 2017-10-26 ENCOUNTER — Encounter (HOSPITAL_COMMUNITY): Payer: Self-pay | Admitting: Physical Therapy

## 2017-10-26 DIAGNOSIS — M25551 Pain in right hip: Secondary | ICD-10-CM

## 2017-10-26 DIAGNOSIS — M25552 Pain in left hip: Secondary | ICD-10-CM | POA: Diagnosis not present

## 2017-10-26 DIAGNOSIS — R2681 Unsteadiness on feet: Secondary | ICD-10-CM

## 2017-10-26 DIAGNOSIS — M6281 Muscle weakness (generalized): Secondary | ICD-10-CM

## 2017-10-26 NOTE — Therapy (Signed)
Ricky Johnston, [place], [address] Phone: [phone]   Fax:  [phone]  Physical Therapy Treatment  Patient Details  Name: Ricky Johnston MRN: [id number] Date of Birth: 1951/03/31 Referring Provider: Jean Rosenthal, MD   Encounter Date: 10/26/2017  PT End of Session - 10/26/17 1344    Visit Number  2    Number of Visits  13    Date for PT Re-Evaluation  11/15/17    Authorization Type  Medicare; [employer]    Authorization Time Period  10/25/17 to 12/06/17    Authorization - Visit Number  2    Authorization - Number of Visits  10    PT Start Time  1300    PT Stop Time  5465    PT Time Calculation (min)  43 min    Activity Tolerance  Patient tolerated treatment well;No increased pain    Behavior During Therapy  WFL for tasks assessed/performed       Past Medical History:  Diagnosis Date  . Arthritis   . Cancer [facility])    bladder cancer currently 2016  . Complication of anesthesia    hard for him to wake up from Anesthesia from left nephrectomy  . COPD (chronic obstructive pulmonary disease) ([place])   . Diabetes mellitus without complication ([place])   . DVT (deep venous thrombosis) ([place]) 01/08/2014, 2010   upper extremity  . GERD (gastroesophageal reflux disease)   . Hallux limitus 05/18/2015   from notes from [place]   . Headache    migraines  . History of kidney stones   . Hypercholesterolemia   . Hypertension   . Impaired hearing   . Intervertebral disc syndrome   . Kidney stones   . Pulmonary fibrosis ([place])   . Renal calculi 01/08/2014   frrom noted from [facility] .in chart  . Restless legs 01/08/2014  . Sciatic leg pain    paralysis of sciatic nerve  . Sleep apnea    CPAP/BIPAP  . Tinnitus     Past Surgical History:  Procedure Laterality Date  . ABDOMINAL SURGERY    . BACK SURGERY    . BLADDER REPAIR    . EYE SURGERY    . HERNIA REPAIR    . LEFT HEART CATHETERIZATION WITH CORONARY  ANGIOGRAM N/A 01/09/2015   Procedure: LEFT HEART CATHETERIZATION WITH CORONARY ANGIOGRAM;  Surgeon: Ricky Demark, MD;  Location: [facility] CATH LAB;  Service: Cardiovascular;  Laterality: N/A;  . NEPHROURETERECTOMY  03/18/2016   with bladder cuff excision- from noted in chart from [facility]  . TOTAL HIP ARTHROPLASTY Left 02/10/2017   Procedure: LEFT TOTAL HIP ARTHROPLASTY ANTERIOR APPROACH;  Surgeon: Ricky Rossetti, MD;  Location: [facility];  Service: Orthopedics;  Laterality: Left;    There were no vitals filed for this visit.  Subjective Assessment - 10/26/17 1254    Subjective  Ricky Johnston states that the evaluation flared him up a little.  He has been up since 4:00 AM this morning.   HE did not take his O2 wtih him.  He is suppost     Pertinent History  current bladder cancer and undergoing treatments, on O2 ("6L when walking long distances"), COPD, DM, neuropathy, HTN, pulmonary fibrosis, arthritis    Limitations  Walking    How long can you sit comfortably?  no issues    How long can you stand comfortably?  no issues    How long can you walk comfortably?  no issues with SPC    Patient Stated Goals  get rid of SPC    Currently in Pain?  Yes    Pain Score  4     Pain Location  Hip    Pain Orientation  Right    Pain Descriptors / Indicators  Aching    Pain Onset  More than a month ago    Aggravating Factors   activity     Pain Relieving Factors  not sure     Effect of Pain on Daily Activities  increases          [facility] PT Assessment - 10/26/17 0001      Balance   Balance Assessed  Yes      Timed Up and Go Test   Normal TUG (seconds)  11.69                  [facility] Adult PT Treatment/Exercise - 10/26/17 0001      Exercises   Exercises  Knee/Hip      Knee/Hip Exercises: Stretches   Passive Hamstring Stretch  Both;2 reps;30 seconds    Gastroc Stretch  3 reps;30 seconds slant board stretch 30" x 3     Other Knee/Hip Stretches  back extension x 5      Knee/Hip  Exercises: Standing   Heel Raises  10 reps    SLS  with one hand assist B       Knee/Hip Exercises: Seated   Other Seated Knee/Hip Exercises  scapularand cervical retraction x 5     Other Seated Knee/Hip Exercises  Ab set; sitting as tall as possible x 5              PT Education - 10/26/17 1344    Education provided  Yes    Person(s) Educated  Patient    Methods  Explanation;Handout    Comprehension  Verbalized understanding;Returned demonstration       PT Short Term Goals - 10/25/17 1643      PT SHORT TERM GOAL #1   Title  Pt will be independent with HEP and perform consistently in order to maximize return to PLOF.    Time  3    Period  Weeks    Status  New    Target Date  11/15/17      PT SHORT TERM GOAL #2   Title  Pt will have 1/2 grade improvement in MMT of all muscle groups tested in order to maximize gait and balance.    Time  3    Period  Weeks    Status  New      PT SHORT TERM GOAL #3   Title  Pt will be able to perform 5xSTS in 10 sec or < with no UE to demo improved functional BLE strength and decrease risk for falls.    Time  3    Period  Weeks    Status  New      PT SHORT TERM GOAL #4   Title  Pt will be active in regular walking program 3x/week or > to maximize strength and overall QOL.    Time  3    Period  Weeks    Status  New        PT Long Term Goals - 10/25/17 1647      PT LONG TERM GOAL #1   Title  Pt will have a 1 grade improvement in MMT of all muscle groups tested in order  to decrease pain, maximize gait, and decrease risk for falls.     Time  6    Period  Weeks    Status  New    Target Date  12/06/17      PT LONG TERM GOAL #2   Title  Pt will be able to perform bil SLS for at least 6 sec or > with no UE to demo improved balance and functional strength of BLE in order to decrease his risk for falls and allow him to ambulate safely with LRAD.    Time  6    Period  Weeks    Status  New      PT LONG TERM GOAL #3   Title  Pt  will score at least 20/24 on the DGI to demo improved overall balance and decrease his risk for falling.    Time  6    Period  Weeks    Status  New      PT LONG TERM GOAL #4   Title  Pt will be able to perform TUG with LRAD in 12 sec or < to demo improved functional mobility and overall balance.    Time  6    Period  Weeks    Status  New      PT LONG TERM GOAL #5   Title  Pt will have improved 3MWT by 180ft or > with LRAD to demo improved functional strength, endurance, and balance in order to maximize community access and participation.    Time  6    Period  Weeks    Status  New            Plan - 10/26/17 1349    Rehab Potential  Fair    PT Frequency  2x / week    PT Duration  6 weeks    PT Treatment/Interventions  ADLs/Self Care Home Management;Cryotherapy;Electrical Stimulation;Moist Heat;DME Instruction;Gait training;Stair training;Functional mobility training;Therapeutic activities;Therapeutic exercise;Balance training;Neuromuscular re-education;Patient/family education;Manual techniques;Scar mobilization;Passive range of motion;Dry needling;Energy conservation;Taping    PT Next Visit Plan  review goals, perform TUG and 3MWT, initiate HEP; functional hip strengthening, balance, manual for soft tissue restrictions    PT Home Exercise Plan  initiate next visit    Consulted and Agree with Plan of Care  Patient       Patient will benefit from skilled therapeutic intervention in order to improve the following deficits and impairments:  Abnormal gait, Cardiopulmonary status limiting activity, Decreased activity tolerance, Decreased balance, Decreased endurance, Decreased mobility, Decreased range of motion, Decreased strength, Difficulty walking, Hypomobility, Increased fascial restricitons, Increased muscle spasms, Impaired flexibility, Impaired sensation, Improper body mechanics, Obesity, Pain  Visit Diagnosis: Pain in left hip  Pain in right hip  Muscle weakness  (generalized)  Unsteadiness on feet   G-Codes - Nov 16, 2017 1702    Functional Assessment Tool Used (Outpatient Only)  clinical judgement, MMT, 5xSTS, SLS, DGI    Functional Limitation  Mobility: Walking and moving around    Mobility: Walking and Moving Around Current Status [phone])  At least 40 percent but less than 60 percent impaired, limited or restricted    Mobility: Walking and Moving Around Goal Status [phone])  At least 20 percent but less than 40 percent impaired, limited or restricted       Problem List Patient Active Problem List   Diagnosis Date Noted  . Unilateral primary osteoarthritis, left hip 02/10/2017  . 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  . H/O total hip arthroplasty, left 02/10/2017  .  History of total hip arthroplasty 02/10/2017  . Respiratory failure ([place])   . OSA (obstructive sleep apnea)   . Pain in the chest   . Chronic systolic heart failure ([place])   . Esophageal reflux   . Chest pain 01/08/2015  . Insulin dependent diabetes mellitus ([place]) 01/08/2015  . COPD (chronic obstructive pulmonary disease) ([place]) 01/08/2015  . Hyperlipidemia associated with type 2 diabetes mellitus [facility]) 01/08/2015    Rayetta Humphrey, PT CLT [phone] 10/26/2017, 1:51 PM  [place] [address] [place], [place], [address] Phone: [phone]   Fax:  [phone]  Name: Ricky Johnston MRN: [id number] Date of Birth: 1951-01-31

## 2017-10-26 NOTE — Patient Instructions (Addendum)
Scapular Retraction (Standing)   May do sitting  With arms at sides, pinch shoulder blades together. Repeat __10__ times per set. Do _1___ sets per session. Do _3___ sessions per day.  http://orth.exer.us/944   Copyright  VHI. All rights reserved.  Flexibility: Neck Retraction    Pull head straight back, keeping eyes and jaw level. Repeat _10___ times per set. Do __1__ sets per session. Do _2___ sessions per day.  http://orth.exer.us/344   Copyright  VHI. All rights reserved.  Isometric Abdominal   Do sitting  Lying on back with knees bent, tighten stomach by pressing elbows down. Hold _5___ seconds. Repeat _10___ times per set. Do ___1_ sets per session. Do __4-6_ sessions per day.  http://orth.exer.us/1086   Copyright  VHI. All rights reserved.

## 2017-10-31 ENCOUNTER — Encounter (HOSPITAL_COMMUNITY): Payer: Self-pay | Admitting: Physical Therapy

## 2017-10-31 ENCOUNTER — Ambulatory Visit (HOSPITAL_COMMUNITY): Payer: Medicare Other | Attending: Orthopaedic Surgery | Admitting: Physical Therapy

## 2017-10-31 DIAGNOSIS — M25551 Pain in right hip: Secondary | ICD-10-CM

## 2017-10-31 DIAGNOSIS — M6281 Muscle weakness (generalized): Secondary | ICD-10-CM

## 2017-10-31 DIAGNOSIS — R2681 Unsteadiness on feet: Secondary | ICD-10-CM

## 2017-10-31 DIAGNOSIS — M25552 Pain in left hip: Secondary | ICD-10-CM | POA: Diagnosis present

## 2017-10-31 NOTE — Therapy (Signed)
[place], [place], [address] Phone: [phone]   Fax:  [phone]  Physical Therapy Treatment  Patient Details  Name: Ricky Johnston MRN: [id number] Date of Birth: 31-Aug-1951 Referring Provider: Jean Rosenthal, MD   Encounter Date: 10/31/2017  PT End of Session - 10/31/17 1414    Visit Number  3    Number of Visits  13    Date for PT Re-Evaluation  11/15/17    Authorization Type  Medicare; [employer]    Authorization Time Period  10/25/17 to 12/06/17    Authorization - Visit Number  3    Authorization - Number of Visits  10    PT Start Time  7169    PT Stop Time  6789    PT Time Calculation (min)  43 min    Activity Tolerance  Patient tolerated treatment well;No increased pain    Behavior During Therapy  WFL for tasks assessed/performed       Past Medical History:  Diagnosis Date  . Arthritis   . Cancer [facility])    bladder cancer currently 2016  . Complication of anesthesia    hard for him to wake up from Anesthesia from left nephrectomy  . COPD (chronic obstructive pulmonary disease) ([place])   . Diabetes mellitus without complication ([place])   . DVT (deep venous thrombosis) ([place]) 01/08/2014, 2010   upper extremity  . GERD (gastroesophageal reflux disease)   . Hallux limitus 05/18/2015   from notes from [place]   . Headache    migraines  . History of kidney stones   . Hypercholesterolemia   . Hypertension   . Impaired hearing   . Intervertebral disc syndrome   . Kidney stones   . Pulmonary fibrosis ([place])   . Renal calculi 01/08/2014   frrom noted from [facility]in chart  . Restless legs 01/08/2014  . Sciatic leg pain    paralysis of sciatic nerve  . Sleep apnea    CPAP/BIPAP  . Tinnitus     Past Surgical History:  Procedure Laterality Date  . ABDOMINAL SURGERY    . BACK SURGERY    . BLADDER REPAIR    . EYE SURGERY    . HERNIA REPAIR    . LEFT HEART CATHETERIZATION WITH CORONARY  ANGIOGRAM N/A 01/09/2015   Procedure: LEFT HEART CATHETERIZATION WITH CORONARY ANGIOGRAM;  Surgeon: Clent Demark, MD;  Location[facility] C[facility] LAB;  Service: Cardiovascular;  Laterality: N/A;  . NEPHROURETERECTOMY  03/18/2016   with bladder cuff excision- from noted in chart from [facility]  . TOTAL HIP ARTHROPLASTY Left 02/10/2017   Procedure: LEFT TOTAL HIP ARTHROPLASTY ANTERIOR APPROACH;  Surgeon: Mcarthur Rossetti, MD;  Location: [facility];  Service: Orthopedics;  Laterality: Left;    There were no vitals filed for this visit.  Subjective Assessment - 10/31/17 1349    Subjective  Pt states that he has done all the exercises except the bridging.  States that he is unable to lie on his back due to breathing issues.   He worked in his garage all yesterday afternoon.     Pertinent History  current bladder cancer and undergoing treatments, on O2 ("6L when walking long distances"), COPD, DM, neuropathy, HTN, pulmonary fibrosis, arthritis    Limitations  Walking    How long can you sit comfortably?  no issues    How long can you stand comfortably?  no issues    How long can you walk  comfortably?  no issues with SPC    Patient Stated Goals  get rid of SPC    Pain Score  6     Pain Location  Back    Pain Orientation  Lower    Pain Descriptors / Indicators  Tightness    Pain Onset  More than a month ago    Aggravating Factors   bending                       [facility] Adult PT Treatment/Exercise - 10/31/17 0001      Ambulation/Gait   Ambulation/Gait  Yes    Ambulation Distance (Feet)  226 Feet    Assistive device  Straight cane;Other (Comment) O2    Gait Comments  3 minutes walk test needed to sit down at 2:30 and increase O2 from 1.5 to 3 L       Exercises   Exercises  Lumbar;Knee/Hip      Lumbar Exercises: Stretches   Standing Extension  5 reps x2      Lumbar Exercises: Standing   Other Standing Lumbar Exercises  wall arch x 5 onto toes.       Knee/Hip Exercises:  Stretches   Gastroc Stretch  3 reps;30 seconds slant board stretch 30" x 3       Knee/Hip Exercises: Standing   SLS  with one hand assist B     Other Standing Knee Exercises  side stepping with green t-band x 2 RT around mat table     Other Standing Knee Exercises  tandem stance x 2       Knee/Hip Exercises: Seated   Sit to Sand  5 reps               PT Short Term Goals - 10/25/17 1643      PT SHORT TERM GOAL #1   Title  Pt will be independent with HEP and perform consistently in order to maximize return to PLOF.    Time  3    Period  Weeks    Status  New    Target Date  11/15/17      PT SHORT TERM GOAL #2   Title  Pt will have 1/2 grade improvement in MMT of all muscle groups tested in order to maximize gait and balance.    Time  3    Period  Weeks    Status  New      PT SHORT TERM GOAL #3   Title  Pt will be able to perform 5xSTS in 10 sec or < with no UE to demo improved functional BLE strength and decrease risk for falls.    Time  3    Period  Weeks    Status  New      PT SHORT TERM GOAL #4   Title  Pt will be active in regular walking program 3x/week or > to maximize strength and overall QOL.    Time  3    Period  Weeks    Status  New        PT Long Term Goals - 10/25/17 1647      PT LONG TERM GOAL #1   Title  Pt will have a 1 grade improvement in MMT of all muscle groups tested in order to decrease pain, maximize gait, and decrease risk for falls.     Time  6    Period  Weeks    Status  New  Target Date  12/06/17      PT LONG TERM GOAL #2   Title  Pt will be able to perform bil SLS for at least 6 sec or > with no UE to demo improved balance and functional strength of BLE in order to decrease his risk for falls and allow him to ambulate safely with LRAD.    Time  6    Period  Weeks    Status  New      PT LONG TERM GOAL #3   Title  Pt will score at least 20/24 on the DGI to demo improved overall balance and decrease his risk for falling.     Time  6    Period  Weeks    Status  New      PT LONG TERM GOAL #4   Title  Pt will be able to perform TUG with LRAD in 12 sec or < to demo improved functional mobility and overall balance.    Time  6    Period  Weeks    Status  New      PT LONG TERM GOAL #5   Title  Pt will have improved 3MWT by 13ft or > with LRAD to demo improved functional strength, endurance, and balance in order to maximize community access and participation.    Time  6    Period  Weeks    Status  New            Plan - 10/31/17 1415    Clinical Impression Statement  Pt completed three minute walk test but needed to sit down at 2:30 due to SOB.  Pt increased O2 from 1.5 to 3.0 and was not able to resume walking before 3:00.  Treatments will focus keeping at an exercise rate where pt does not need to stop and rest due to SOB.      Rehab Potential  Fair    PT Frequency  2x / week    PT Duration  6 weeks    PT Treatment/Interventions  ADLs/Self Care Home Management;Cryotherapy;Electrical Stimulation;Moist Heat;DME Instruction;Gait training;Stair training;Functional mobility training;Therapeutic activities;Therapeutic exercise;Balance training;Neuromuscular re-education;Patient/family education;Manual techniques;Scar mobilization;Passive range of motion;Dry needling;Energy conservation;Taping    PT Next Visit Plan  ; functional hip strengthening, balance, manual for soft tissue restrictions    PT Home Exercise Plan  Given scapulart and cervical retraction, abdominal isometric and sitting tall     Consulted and Agree with Plan of Care  Patient       Patient will benefit from skilled therapeutic intervention in order to improve the following deficits and impairments:  Abnormal gait, Cardiopulmonary status limiting activity, Decreased activity tolerance, Decreased balance, Decreased endurance, Decreased mobility, Decreased range of motion, Decreased strength, Difficulty walking, Hypomobility, Increased fascial  restricitons, Increased muscle spasms, Impaired flexibility, Impaired sensation, Improper body mechanics, Obesity, Pain  Visit Diagnosis: Pain in left hip  Pain in right hip  Muscle weakness (generalized)  Unsteadiness on feet     Problem List Patient Active Problem List   Diagnosis Date Noted  . Unilateral primary osteoarthritis, left hip 02/10/2017  . 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  . H/O total hip arthroplasty, left 02/10/2017  . History of total hip arthroplasty 02/10/2017  . Respiratory failure ([place])   . OSA (obstructive sleep apnea)   . Pain in the chest   . Chronic systolic heart failure ([place])   . Esophageal reflux   . Chest pain 01/08/2015  . Insulin dependent diabetes mellitus ([place])  01/08/2015  . COPD (chronic obstructive pulmonary disease) ([place]) 01/08/2015  . Hyperlipidemia associated with type 2 diabetes mellitus [facility]) 01/08/2015    Rayetta Humphrey, PT CLT [phone] 10/31/2017, 2:34 PM  [place] [address] [place], [place], [address] Phone: [phone]   Fax:  [phone]  Name: Ricky Johnston MRN: [id number] Date of Birth: October 13, 1951

## 2017-11-02 ENCOUNTER — Encounter (HOSPITAL_COMMUNITY): Payer: Self-pay

## 2017-11-02 ENCOUNTER — Other Ambulatory Visit: Payer: Self-pay

## 2017-11-02 ENCOUNTER — Ambulatory Visit (HOSPITAL_COMMUNITY): Payer: Medicare Other

## 2017-11-02 DIAGNOSIS — M25552 Pain in left hip: Secondary | ICD-10-CM | POA: Diagnosis not present

## 2017-11-02 DIAGNOSIS — M25551 Pain in right hip: Secondary | ICD-10-CM

## 2017-11-02 DIAGNOSIS — M6281 Muscle weakness (generalized): Secondary | ICD-10-CM

## 2017-11-02 DIAGNOSIS — R2681 Unsteadiness on feet: Secondary | ICD-10-CM

## 2017-11-02 NOTE — Therapy (Signed)
[place], [place], [address] Phone: [phone]   Fax:  [phone]  Physical Therapy Treatment  Patient Details  Name: Ricky Johnston MRN: [id number] Date of Birth: [birth year] Referring Provider: Jean Rosenthal, MD   Encounter Date: 11/02/2017  PT End of Session - 11/02/17 1257    Visit Number  4    Number of Visits  13    Date for PT Re-Evaluation  11/15/17    Authorization Type  Medicare; [employer]    Authorization Time Period  10/25/17 to 12/06/17    Authorization - Visit Number  4    Authorization - Number of Visits  10    PT Start Time  1300    PT Stop Time  1344    PT Time Calculation (min)  44 min    Activity Tolerance  Patient tolerated treatment well;No increased pain    Behavior During Therapy  WFL for tasks assessed/performed       Past Medical History:  Diagnosis Date  . Arthritis   . Cancer [facility])    bladder cancer currently 2016  . Complication of anesthesia    hard for him to wake up from Anesthesia from left nephrectomy  . COPD (chronic obstructive pulmonary disease) ([place])   . Diabetes mellitus without complication ([place])   . DVT (deep venous thrombosis) ([place]) 01/08/2014, 2010   upper extremity  . GERD (gastroesophageal reflux disease)   . Hallux limitus 05/18/2015   from notes from [place]   . Headache    migraines  . History of kidney stones   . Hypercholesterolemia   . Hypertension   . Impaired hearing   . Intervertebral disc syndrome   . Kidney stones   . Pulmonary fibrosis ([place])   . Renal calculi 01/08/2014   frrom noted from [facility] .in chart  . Restless legs 01/08/2014  . Sciatic leg pain    paralysis of sciatic nerve  . Sleep apnea    CPAP/BIPAP  . Tinnitus     Past Surgical History:  Procedure Laterality Date  . ABDOMINAL SURGERY    . BACK SURGERY    . BLADDER REPAIR    . EYE SURGERY    . HERNIA REPAIR    . LEFT HEART CATHETERIZATION WITH CORONARY  ANGIOGRAM N/A 01/09/2015   Procedure: LEFT HEART CATHETERIZATION WITH CORONARY ANGIOGRAM;  Surgeon: Clent Demark, MD;  Location: [facility] CATH LAB;  Service: Cardiovascular;  Laterality: N/A;  . NEPHROURETERECTOMY  03/18/2016   with bladder cuff excision- from noted in chart from [facility]  . TOTAL HIP ARTHROPLASTY Left 02/10/2017   Procedure: LEFT TOTAL HIP ARTHROPLASTY ANTERIOR APPROACH;  Surgeon: Mcarthur Rossetti, MD;  Location: [facility] ORS;  Service: Orthopedics;  Laterality: Left;    There were no vitals filed for this visit.  Subjective Assessment - 11/02/17 1258    Subjective  Pt reports that he is having a good day. He is having 3/10 LBP, he is unsure what caused that. No hip pain.    Pertinent History  current bladder cancer and undergoing treatments, on O2 ("6L when walking long distances"), COPD, DM, neuropathy, HTN, pulmonary fibrosis, arthritis    Limitations  Walking    How long can you sit comfortably?  no issues    How long can you stand comfortably?  no issues    How long can you walk comfortably?  no issues with SPC    Patient Stated Goals  get rid  of SPC    Currently in Pain?  Yes    Pain Score  3     Pain Location  Back    Pain Orientation  Lower    Pain Descriptors / Indicators  Tightness    Pain Type  Chronic pain    Pain Onset  More than a month ago    Pain Frequency  Constant    Aggravating Factors   beding    Pain Relieving Factors  not sure    Effect of Pain on Daily Activities  increases           [facility] Adult PT Treatment/Exercise - 11/02/17 0001      Lumbar Exercises: Stretches   Standing Extension  5 reps 5-10" holds      Lumbar Exercises: Standing   Other Standing Lumbar Exercises  3D hip excursions x10 reps each      Knee/Hip Exercises: Stretches   Gastroc Stretch  3 reps;30 seconds    Gastroc Stretch Limitations  slant board      Knee/Hip Exercises: Standing   Lateral Step Up  Both;10 reps;Hand Hold: 2;Step Height: 4"    Forward Step Up   Both;10 reps;Hand Hold: 1;Step Height: 4"    Functional Squat  10 reps    Functional Squat Limitations  min cues for form    Other Standing Knee Exercises  side stepping with green t-band x 2 RT in // bars    Other Standing Knee Exercises  bil tandem stance 3x10" holds (intermittent UE support); bil staggered stance +ball toss x1 min each      Knee/Hip Exercises: Seated   Sit to Sand  10 reps;without UE support            PT Education - 11/02/17 1303    Education provided  Yes    Education Details  exercise technique, proper breathing technique    Person(s) Educated  Patient    Methods  Explanation;Demonstration    Comprehension  Verbalized understanding;Returned demonstration       PT Short Term Goals - 10/25/17 1643      PT SHORT TERM GOAL #1   Title  Pt will be independent with HEP and perform consistently in order to maximize return to PLOF.    Time  3    Period  Weeks    Status  New    Target Date  11/15/17      PT SHORT TERM GOAL #2   Title  Pt will have 1/2 grade improvement in MMT of all muscle groups tested in order to maximize gait and balance.    Time  3    Period  Weeks    Status  New      PT SHORT TERM GOAL #3   Title  Pt will be able to perform 5xSTS in 10 sec or < with no UE to demo improved functional BLE strength and decrease risk for falls.    Time  3    Period  Weeks    Status  New      PT SHORT TERM GOAL #4   Title  Pt will be active in regular walking program 3x/week or > to maximize strength and overall QOL.    Time  3    Period  Weeks    Status  New        PT Long Term Goals - 10/25/17 1647      PT LONG TERM GOAL #1   Title  Pt  will have a 1 grade improvement in MMT of all muscle groups tested in order to decrease pain, maximize gait, and decrease risk for falls.     Time  6    Period  Weeks    Status  New    Target Date  12/06/17      PT LONG TERM GOAL #2   Title  Pt will be able to perform bil SLS for at least 6 sec or > with no  UE to demo improved balance and functional strength of BLE in order to decrease his risk for falls and allow him to ambulate safely with LRAD.    Time  6    Period  Weeks    Status  New      PT LONG TERM GOAL #3   Title  Pt will score at least 20/24 on the DGI to demo improved overall balance and decrease his risk for falling.    Time  6    Period  Weeks    Status  New      PT LONG TERM GOAL #4   Title  Pt will be able to perform TUG with LRAD in 12 sec or < to demo improved functional mobility and overall balance.    Time  6    Period  Weeks    Status  New      PT LONG TERM GOAL #5   Title  Pt will have improved 3MWT by 133ft or > with LRAD to demo improved functional strength, endurance, and balance in order to maximize community access and participation.    Time  6    Period  Weeks    Status  New            Plan - 11/02/17 1347    Clinical Impression Statement  Continued with established POC focusing on hip ROM, strengthening, and initiated balance this date. Cues for proper breathing technique throughout therex in order to decrease desaturation. He started on 0.5L O2 and ended on 3L at beginning of session. O2 ranged from 77 to 97% throughout entire session; O2 dropped to 77% during lateral step ups while on 1L. Increased to 3L and saturation returned to mid-90s after about 2 min rest break. O2 remained in 90s with 3L. Pt had 1 major LOB when putting his jacket on to exit but caught himself at the sink to prevent a fall. No reports of pain during session or at EOS.    Rehab Potential  Fair    PT Frequency  2x / week    PT Duration  6 weeks    PT Treatment/Interventions  ADLs/Self Care Home Management;Cryotherapy;Electrical Stimulation;Moist Heat;DME Instruction;Gait training;Stair training;Functional mobility training;Therapeutic activities;Therapeutic exercise;Balance training;Neuromuscular re-education;Patient/family education;Manual techniques;Scar mobilization;Passive range  of motion;Dry needling;Energy conservation;Taping    PT Next Visit Plan  progress functional hip strengthening, balance as tolerated and manual for soft tissue restrictions PRN    PT Home Exercise Plan  Given scapulart and cervical retraction, abdominal isometric and sitting tall     Consulted and Agree with Plan of Care  Patient       Patient will benefit from skilled therapeutic intervention in order to improve the following deficits and impairments:  Abnormal gait, Cardiopulmonary status limiting activity, Decreased activity tolerance, Decreased balance, Decreased endurance, Decreased mobility, Decreased range of motion, Decreased strength, Difficulty walking, Hypomobility, Increased fascial restricitons, Increased muscle spasms, Impaired flexibility, Impaired sensation, Improper body mechanics, Obesity, Pain  Visit Diagnosis: Pain in left hip  Pain in right hip  Muscle weakness (generalized)  Unsteadiness on feet     Problem List Patient Active Problem List   Diagnosis Date Noted  . Unilateral primary osteoarthritis, left hip 02/10/2017  . 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  . H/O total hip arthroplasty, left 02/10/2017  . History of total hip arthroplasty 02/10/2017  . Respiratory failure ([place])   . OSA (obstructive sleep apnea)   . Pain in the chest   . Chronic systolic heart failure ([place])   . Esophageal reflux   . Chest pain 01/08/2015  . Insulin dependent diabetes mellitus ([place]) 01/08/2015  . COPD (chronic obstructive pulmonary disease) ([place]) 01/08/2015  . Hyperlipidemia associated with type 2 diabetes mellitus ([place]) 01/08/2015      Geraldine Solar PT, DPT  [place] [address] [place], [place], [address] Phone: [phone]   Fax:  [phone]  Name: Erez Mccallum MRN: [id number] Date of Birth: 09-21-1951

## 2017-11-07 ENCOUNTER — Encounter (HOSPITAL_COMMUNITY): Payer: Self-pay | Admitting: Physical Therapy

## 2017-11-07 ENCOUNTER — Ambulatory Visit (HOSPITAL_COMMUNITY): Payer: Medicare Other | Admitting: Physical Therapy

## 2017-11-07 ENCOUNTER — Encounter (HOSPITAL_COMMUNITY): Payer: Medicare Other

## 2017-11-07 DIAGNOSIS — M25551 Pain in right hip: Secondary | ICD-10-CM

## 2017-11-07 DIAGNOSIS — R2681 Unsteadiness on feet: Secondary | ICD-10-CM

## 2017-11-07 DIAGNOSIS — M25552 Pain in left hip: Secondary | ICD-10-CM

## 2017-11-07 DIAGNOSIS — M6281 Muscle weakness (generalized): Secondary | ICD-10-CM

## 2017-11-07 NOTE — Therapy (Signed)
[place], [place], [address] Phone: [phone]   Fax:  [phone]  Physical Therapy Treatment  Patient Details  Name: Ricky Johnston MRN: [id number] Date of Birth: 1951/05/12 Referring Provider: Jean Rosenthal, MD   Encounter Date: 11/07/2017  PT End of Session - 11/07/17 1423    Visit Number  5    Number of Visits  13    Date for PT Re-Evaluation  11/15/17    Authorization Type  Medicare; [employer]    Authorization Time Period  10/25/17 to 12/06/17    Authorization - Visit Number  5    Authorization - Number of Visits  10    PT Start Time  3748    PT Stop Time  1430    PT Time Calculation (min)  45 min    Activity Tolerance  Patient tolerated treatment well;No increased pain    Behavior During Therapy  WFL for tasks assessed/performed       Past Medical History:  Diagnosis Date  . Arthritis   . Cancer [facility])    bladder cancer currently 2016  . Complication of anesthesia    hard for him to wake up from Anesthesia from left nephrectomy  . COPD (chronic obstructive pulmonary disease) ([place])   . Diabetes mellitus without complication ([place])   . DVT (deep venous thrombosis) ([place]) 01/08/2014, 2010   upper extremity  . GERD (gastroesophageal reflux disease)   . Hallux limitus 05/18/2015   from notes from [place]   . Headache    migraines  . History of kidney stones   . Hypercholesterolemia   . Hypertension   . Impaired hearing   . Intervertebral disc syndrome   . Kidney stones   . Pulmonary fibrosis ([place])   . Renal calculi 01/08/2014   frrom noted from [facility] .in chart  . Restless legs 01/08/2014  . Sciatic leg pain    paralysis of sciatic nerve  . Sleep apnea    CPAP/BIPAP  . Tinnitus     Past Surgical History:  Procedure Laterality Date  . ABDOMINAL SURGERY    . BACK SURGERY    . BLADDER REPAIR    . EYE SURGERY    . HERNIA REPAIR    . LEFT HEART CATHETERIZATION WITH CORONARY  ANGIOGRAM N/A 01/09/2015   Procedure: LEFT HEART CATHETERIZATION WITH CORONARY ANGIOGRAM;  Surgeon: Clent Demark, MD;  Location: [facility] CATH LAB;  Service: Cardiovascular;  Laterality: N/A;  . NEPHROURETERECTOMY  03/18/2016   with bladder cuff excision- from noted in chart from [facility]  . TOTAL HIP ARTHROPLASTY Left 02/10/2017   Procedure: LEFT TOTAL HIP ARTHROPLASTY ANTERIOR APPROACH;  Surgeon: Mcarthur Rossetti, MD;  [facility];  Service: Orthopedics;  Laterality: Left;    There were no vitals filed for this visit.  Subjective Assessment - 11/07/17 1343    Subjective  Pt states that he is doing his exercises at home but he is unable to do any exercise supine due to breathing issues.  His hip pain is gone but he is having low back pain.   He fell at his home on the throw rug.      Pertinent History  current bladder cancer and undergoing treatments, on O2 ("6L when walking long distances"), COPD, DM, neuropathy, HTN, pulmonary fibrosis, arthritis    Limitations  Walking    How long can you sit comfortably?  no issues    How long can you stand comfortably?  no issues    How long can you walk comfortably?  no issues with SPC    Patient Stated Goals  get rid of SPC    Currently in Pain?  Yes    Pain Score  6     Pain Location  Back    Pain Orientation  Lower    Pain Descriptors / Indicators  Aching    Pain Onset  More than a month ago    Pain Frequency  Intermittent    Aggravating Factors   moving to the left .                       [place] Adult PT Treatment/Exercise - 11/07/17 0001      Lumbar Exercises: Standing   Other Standing Lumbar Exercises  wall arch x 10 onto toes.   wall pushup x 10     Other Standing Lumbar Exercises  3D hip excursions x10 reps each      Knee/Hip Exercises: Standing   Lateral Step Up  Both;10 reps;Hand Hold: 2;Step Height: 4"    Forward Step Up  Both;10 reps;Hand Hold: 1;Step Height: 4"    Functional Squat  10 reps    Other  Standing Knee Exercises  sidestepping at mat x 2 RT ; hip extension at counter with 5# wt x 10 B     Other Standing Knee Exercises  bil tandem stance with head trunsx 5  each      Knee/Hip Exercises: Seated   Sit to Sand  10 reps;without UE support               PT Short Term Goals - 10/25/17 1643      PT SHORT TERM GOAL #1   Title  Pt will be independent with HEP and perform consistently in order to maximize return to PLOF.    Time  3    Period  Weeks    Status  New    Target Date  11/15/17      PT SHORT TERM GOAL #2   Title  Pt will have 1/2 grade improvement in MMT of all muscle groups tested in order to maximize gait and balance.    Time  3    Period  Weeks    Status  New      PT SHORT TERM GOAL #3   Title  Pt will be able to perform 5xSTS in 10 sec or < with no UE to demo improved functional BLE strength and decrease risk for falls.    Time  3    Period  Weeks    Status  New      PT SHORT TERM GOAL #4   Title  Pt will be active in regular walking program 3x/week or > to maximize strength and overall QOL.    Time  3    Period  Weeks    Status  New        PT Long Term Goals - 10/25/17 1647      PT LONG TERM GOAL #1   Title  Pt will have a 1 grade improvement in MMT of all muscle groups tested in order to decrease pain, maximize gait, and decrease risk for falls.     Time  6    Period  Weeks    Status  New    Target Date  12/06/17      PT LONG TERM GOAL #2   Title  Pt  will be able to perform bil SLS for at least 6 sec or > with no UE to demo improved balance and functional strength of BLE in order to decrease his risk for falls and allow him to ambulate safely with LRAD.    Time  6    Period  Weeks    Status  New      PT LONG TERM GOAL #3   Title  Pt will score at least 20/24 on the DGI to demo improved overall balance and decrease his risk for falling.    Time  6    Period  Weeks    Status  New      PT LONG TERM GOAL #4   Title  Pt will be able  to perform TUG with LRAD in 12 sec or < to demo improved functional mobility and overall balance.    Time  6    Period  Weeks    Status  New      PT LONG TERM GOAL #5   Title  Pt will have improved 3MWT by 143ft or > with LRAD to demo improved functional strength, endurance, and balance in order to maximize community access and participation.    Time  6    Period  Weeks    Status  New            Plan - 11/07/17 1424    Clinical Impression Statement  Pt needed multiple rest breaks throughout treatment.  Pt remains challenged with balance activity.      Rehab Potential  Fair    PT Frequency  2x / week    PT Duration  6 weeks    PT Treatment/Interventions  ADLs/Self Care Home Management;Cryotherapy;Electrical Stimulation;Moist Heat;DME Instruction;Gait training;Stair training;Functional mobility training;Therapeutic activities;Therapeutic exercise;Balance training;Neuromuscular re-education;Patient/family education;Manual techniques;Scar mobilization;Passive range of motion;Dry needling;Energy conservation;Taping    PT Next Visit Plan  Continue with current program do not add until pt no longer needs rest breaks.      PT Home Exercise Plan  none given today.     Consulted and Agree with Plan of Care  Patient       Patient will benefit from skilled therapeutic intervention in order to improve the following deficits and impairments:  Abnormal gait, Cardiopulmonary status limiting activity, Decreased activity tolerance, Decreased balance, Decreased endurance, Decreased mobility, Decreased range of motion, Decreased strength, Difficulty walking, Hypomobility, Increased fascial restricitons, Increased muscle spasms, Impaired flexibility, Impaired sensation, Improper body mechanics, Obesity, Pain  Visit Diagnosis: Pain in left hip  Pain in right hip  Muscle weakness (generalized)  Unsteadiness on feet     Problem List Patient Active Problem List   Diagnosis Date Noted  .  Unilateral primary osteoarthritis, left hip 02/10/2017  . 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  . H/O total hip arthroplasty, left 02/10/2017  . History of total hip arthroplasty 02/10/2017  . Respiratory failure ([place])   . OSA (obstructive sleep apnea)   . Pain in the chest   . Chronic systolic heart failure ([place])   . Esophageal reflux   . Chest pain 01/08/2015  . Insulin dependent diabetes mellitus ([place]) 01/08/2015  . COPD (chronic obstructive pulmonary disease) ([place]) 01/08/2015  . Hyperlipidemia associated with type 2 diabetes mellitus [facility]) 01/08/2015    Rayetta Humphrey, PT CLT [phone] 11/07/2017, 2:33 PM  [place] [address] [place], [place], [address] Phone: [phone]   Fax:  [phone]  Name: Jarin Cornfield MRN: [id number]  Date of Birth: [birth year]

## 2017-11-09 ENCOUNTER — Encounter (HOSPITAL_COMMUNITY): Payer: Medicare Other

## 2017-11-14 ENCOUNTER — Encounter (HOSPITAL_COMMUNITY): Payer: Self-pay

## 2017-11-14 ENCOUNTER — Ambulatory Visit (HOSPITAL_COMMUNITY): Payer: Medicare Other

## 2017-11-14 ENCOUNTER — Other Ambulatory Visit: Payer: Self-pay

## 2017-11-14 DIAGNOSIS — M25552 Pain in left hip: Secondary | ICD-10-CM

## 2017-11-14 DIAGNOSIS — M6281 Muscle weakness (generalized): Secondary | ICD-10-CM

## 2017-11-14 DIAGNOSIS — M25551 Pain in right hip: Secondary | ICD-10-CM

## 2017-11-14 DIAGNOSIS — R2681 Unsteadiness on feet: Secondary | ICD-10-CM

## 2017-11-14 NOTE — Patient Instructions (Signed)
  SIT TO STAND - NO SUPPORT  Start by scooting close to the front of the chair.  Next, lean forward at your trunk and reach forward with your arms and rise to standing without using your hands to push off from the chair or other object.   Use your arms as a counter-balance by reaching forward when in sitting and lower them as you approach standing.   Perform 1x/day, 2-3 sets of 10-15 reps     WALKING  Start a walking program.  Front door to back door and then take a break. Perform this 3-5 rounds with breaks in between and as it gets easier, increase the distance that you can walk.

## 2017-11-14 NOTE — Therapy (Signed)
[place] [address], [place], [address] Phone: [phone]   Fax:  [phone]  Physical Therapy Treatment/Reassessment  Patient Details  Name: Ricky Johnston MRN: [id number] Date of Birth: 1951/02/13 Referring Provider: Jean Rosenthal, MD   Encounter Date: 11/14/2017  PT End of Session - 11/14/17 1349    Visit Number  6    Number of Visits  13    Date for PT Re-Evaluation  12/06/16    Authorization Type  Medicare; [employer] (g-codes done on 21-Feb-2023 visit)    Authorization Time Period  10/25/17 to 12/06/17    Authorization - Visit Number  6    Authorization - Number of Visits  10    PT Start Time  5277    PT Stop Time  8242    PT Time Calculation (min)  43 min    Activity Tolerance  Patient tolerated treatment well;No increased pain    Behavior During Therapy  WFL for tasks assessed/performed       Past Medical History:  Diagnosis Date  . Arthritis   . Cancer [facility])    bladder cancer currently 2016  . Complication of anesthesia    hard for him to wake up from Anesthesia from left nephrectomy  . COPD (chronic obstructive pulmonary disease) ([place])   . Diabetes mellitus without complication ([place])   . DVT (deep venous thrombosis) ([place]) 01/08/2014, 2010   upper extremity  . GERD (gastroesophageal reflux disease)   . Hallux limitus 05/18/2015   from notes from [place]   . Headache    migraines  . History of kidney stones   . Hypercholesterolemia   . Hypertension   . Impaired hearing   . Intervertebral disc syndrome   . Kidney stones   . Pulmonary fibrosis ([place])   . Renal calculi 01/08/2014   frrom noted from [facility] .in chart  . Restless legs 01/08/2014  . Sciatic leg pain    paralysis of sciatic nerve  . Sleep apnea    CPAP/BIPAP  . Tinnitus     Past Surgical History:  Procedure Laterality Date  . ABDOMINAL SURGERY    . BACK SURGERY    . BLADDER REPAIR    . EYE SURGERY    . HERNIA REPAIR    . LEFT  HEART CATHETERIZATION WITH CORONARY ANGIOGRAM N/A 01/09/2015   Procedure: LEFT HEART CATHETERIZATION WITH CORONARY ANGIOGRAM;  Surgeon: Clent Demark, MD;  Location: [facility] CATH LAB;  Service: Cardiovascular;  Laterality: N/A;  . NEPHROURETERECTOMY  03/18/2016   with bladder cuff excision- from noted in chart from [facility]  . TOTAL HIP ARTHROPLASTY Left 02/10/2017   Procedure: LEFT TOTAL HIP ARTHROPLASTY ANTERIOR APPROACH;  Surgeon: Mcarthur Rossetti, MD;  Location: [facility];  Service: Orthopedics;  Laterality: Left;    There were no vitals filed for this visit.  Subjective Assessment - 11/14/17 1350    Subjective  Pt states that they drove to [place] last week and got back yesterday. He states that his hips are feeling much better, but his lower back is really bothering him.     Pertinent History  current bladder cancer and undergoing treatments, on O2 ("6L when walking long distances"), COPD, DM, neuropathy, HTN, pulmonary fibrosis, arthritis    Limitations  Walking    How long can you sit comfortably?  no issues    How long can you stand comfortably?  no issues    How long can you walk comfortably?  no issues with SPC    Patient Stated Goals  get rid of SPC    Currently in Pain?  Yes    Pain Score  6     Pain Location  Back    Pain Orientation  Lower    Pain Descriptors / Indicators  Aching    Pain Type  Chronic pain    Pain Onset  More than a month ago    Pain Frequency  Intermittent    Aggravating Factors   moving to the L    Pain Relieving Factors  not sure    Effect of Pain on Daily Activities  increases         [facility] PT Assessment - 11/14/17 0001      Strength   Right Hip External Rotation   5/5    Right Hip Internal Rotation  5/5    Right Hip ABduction  4+/5 was 4    Left Hip Flexion  4+/5 was 4+    Left Hip External Rotation  5/5 was 4+    Left Hip Internal Rotation  4+/5 was 4    Left Hip ABduction  3-/5 could not rasie through full ROM; was 4    Right Ankle  Dorsiflexion  4/5 was 4    Left Ankle Dorsiflexion  4-/5 was 4-      Ambulation/Gait   Ambulation/Gait  Yes    Ambulation Distance (Feet)  194 Feet 3MWT    Assistive device  Straight cane;Other (Comment) O2    Gait Comments  pt required 1 standing rest break with ~1:20 left and he took the remainder of the time to catch his breath      Balance   Balance Assessed  Yes      Static Standing Balance   Static Standing - Balance Support  No upper extremity supported    Static Standing Balance -  Activities   Single Leg Stance - Right Leg;Single Leg Stance - Left Leg    Static Standing - Comment/# of Minutes  R & L: 15 sec with bil 1 fingertip assist      Standardized Balance Assessment   Standardized Balance Assessment  Five Times Sit to Stand;Dynamic Gait Index;Timed Up and Go Test    Five times sit to stand comments   11.88 sec, no UE                  [facility] Adult PT Treatment/Exercise - 11/14/17 0001      Lumbar Exercises: Standing   Other Standing Lumbar Exercises  wall arch x 10 onto toes             PT Education - 11/14/17 1428    Education provided  Yes    Education Details  reassessment findings; updated HEP    Person(s) Educated  Patient    Methods  Explanation;Demonstration;Handout    Comprehension  Verbalized understanding;Returned demonstration       PT Short Term Goals - 11/14/17 1353      PT SHORT TERM GOAL #1   Title  Pt will be independent with HEP and perform consistently in order to maximize return to PLOF.    Time  3    Period  Weeks    Status  Achieved      PT SHORT TERM GOAL #2   Title  Pt will have 1/2 grade improvement in MMT of all muscle groups tested in order to maximize gait and balance.    Time  3    Period  Weeks    Status  On-going      PT SHORT TERM GOAL #3   Title  Pt will be able to perform 5xSTS in 10 sec or < with no UE to demo improved functional BLE strength and decrease risk for falls.    Time  3    Period  Weeks     Status  On-going      PT SHORT TERM GOAL #4   Title  Pt will be active in regular walking program 3x/week or > to maximize strength and overall QOL.    Baseline  12/18; hasn't started a regular walking program    Time  3    Period  Weeks    Status  On-going        PT Long Term Goals - 11/14/17 1354      PT LONG TERM GOAL #1   Title  Pt will have a 1 grade improvement in MMT of all muscle groups tested in order to decrease pain, maximize gait, and decrease risk for falls.     Time  6    Period  Weeks    Status  On-going      PT LONG TERM GOAL #2   Title  Pt will be able to perform bil SLS for at least 6 sec or > with no UE to demo improved balance and functional strength of BLE in order to decrease his risk for falls and allow him to ambulate safely with LRAD.    Baseline  12/18: 15 sec BLE with bil 1 fingertip assist    Time  6    Period  Weeks    Status  On-going      PT LONG TERM GOAL #3   Title  Pt will score at least 20/24 on the DGI to demo improved overall balance and decrease his risk for falling.    Time  6    Period  Weeks    Status  On-going      PT LONG TERM GOAL #4   Title  Pt will be able to perform TUG with LRAD in 12 sec or < to demo improved functional mobility and overall balance.    Baseline  12/18: 11. 7 sec on 2nd visit with SPC    Time  6    Period  Weeks    Status  Achieved      PT LONG TERM GOAL #5   Title  Pt will have improved 3MWT by 137ft or > with LRAD to demo improved functional strength, endurance, and balance in order to maximize community access and participation.    Time  6    Period  Weeks    Status  On-going            Plan - 11/14/17 1429    Clinical Impression Statement  PT rerassessed pt's goals and outcome measures this date. Pt has made good progress towards goals as illustrated above. His functional strength is improving nicely AEB his improvements in the 5xSTS. However, his main limiting factor continues to be his  cardiopulm limitations with his O2 as he continues to desaturate with minimal activity. His LBP has also started bothering him lately but he states that's due to riding/sitting so much in the car. He feels he's improved about 30%, stating that his balance is starting to come back, getting up out of chairs is easier, and his hip pain has decreased. Unable to  perform DGI this date due to time constraints. Will continue POC as planned.    Rehab Potential  Fair    PT Frequency  2x / week    PT Duration  6 weeks    PT Treatment/Interventions  ADLs/Self Care Home Management;Cryotherapy;Electrical Stimulation;Moist Heat;DME Instruction;Gait training;Stair training;Functional mobility training;Therapeutic activities;Therapeutic exercise;Balance training;Neuromuscular re-education;Patient/family education;Manual techniques;Scar mobilization;Passive range of motion;Dry needling;Energy conservation;Taping    PT Next Visit Plan  DGI for reassessment; Continue with current program do not add until pt no longer needs rest breaks.      PT Home Exercise Plan  added STS and walking program in house    Consulted and Agree with Plan of Care  Patient       Patient will benefit from skilled therapeutic intervention in order to improve the following deficits and impairments:  Abnormal gait, Cardiopulmonary status limiting activity, Decreased activity tolerance, Decreased balance, Decreased endurance, Decreased mobility, Decreased range of motion, Decreased strength, Difficulty walking, Hypomobility, Increased fascial restricitons, Increased muscle spasms, Impaired flexibility, Impaired sensation, Improper body mechanics, Obesity, Pain  Visit Diagnosis: Pain in left hip  Pain in right hip  Muscle weakness (generalized)  Unsteadiness on feet   G-Codes - Nov 27, 2017 1429    Functional Assessment Tool Used (Outpatient Only)  clinical judgement, MMT, 5xSTS, SLS, DGI    Functional Limitation  Mobility: Walking and moving  around    Mobility: Walking and Moving Around Current Status [phone])  At least 40 percent but less than 60 percent impaired, limited or restricted    Mobility: Walking and Moving Around Goal Status [phone])  At least 20 percent but less than 40 percent impaired, limited or restricted       Problem List Patient Active Problem List   Diagnosis Date Noted  . Unilateral primary osteoarthritis, left hip 02/10/2017  . 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  . H/O total hip arthroplasty, left 02/10/2017  . History of total hip arthroplasty 02/10/2017  . Respiratory failure ([place])   . OSA (obstructive sleep apnea)   . Pain in the chest   . Chronic systolic heart failure ([place])   . Esophageal reflux   . Chest pain 01/08/2015  . Insulin dependent diabetes mellitus ([place]) 01/08/2015  . COPD (chronic obstructive pulmonary disease) ([place]) 01/08/2015  . Hyperlipidemia associated with type 2 diabetes mellitus ([place]) 01/08/2015      Geraldine Solar PT, DPT  [place] [address] [place], [place], [address] Phone: [phone]   Fax:  [phone]  Name: Ricky Johnston MRN: [id number] Date of Birth: May 24, 1951

## 2017-11-16 ENCOUNTER — Ambulatory Visit (HOSPITAL_COMMUNITY): Payer: Medicare Other

## 2017-11-16 ENCOUNTER — Encounter (HOSPITAL_COMMUNITY): Payer: Self-pay

## 2017-11-16 ENCOUNTER — Other Ambulatory Visit: Payer: Self-pay

## 2017-11-16 DIAGNOSIS — M25552 Pain in left hip: Secondary | ICD-10-CM

## 2017-11-16 DIAGNOSIS — R2681 Unsteadiness on feet: Secondary | ICD-10-CM

## 2017-11-16 DIAGNOSIS — M25551 Pain in right hip: Secondary | ICD-10-CM

## 2017-11-16 DIAGNOSIS — M6281 Muscle weakness (generalized): Secondary | ICD-10-CM

## 2017-11-16 NOTE — Therapy (Signed)
[place], [place], [address] Phone: [phone]   Fax:  [phone]  Physical Therapy Treatment  Patient Details  Name: Ricky Johnston MRN: [id number] Date of Birth: [birth year] Referring Provider: Jean Rosenthal, MD   Encounter Date: 11/16/2017  PT End of Session - 11/16/17 1257    Visit Number  7    Number of Visits  13    Date for PT Re-Evaluation  12/06/16    Authorization Type  Medicare; [employer] (g-codes done on 02/03/2023 visit)    Authorization Time Period  10/25/17 to 12/06/17    Authorization - Visit Number  7    Authorization - Number of Visits  10    PT Start Time  1884    PT Stop Time  1340    PT Time Calculation (min)  42 min    Activity Tolerance  Patient tolerated treatment well;No increased pain    Behavior During Therapy  WFL for tasks assessed/performed       Past Medical History:  Diagnosis Date  . Arthritis   . Cancer [facility])    bladder cancer currently 2016  . Complication of anesthesia    hard for him to wake up from Anesthesia from left nephrectomy  . COPD (chronic obstructive pulmonary disease) ([place])   . Diabetes mellitus without complication ([place])   . DVT (deep venous thrombosis) ([place]) 01/08/2014, 2010   upper extremity  . GERD (gastroesophageal reflux disease)   . Hallux limitus 05/18/2015   from notes from [place]   . Headache    migraines  . History of kidney stones   . Hypercholesterolemia   . Hypertension   . Impaired hearing   . Intervertebral disc syndrome   . Kidney stones   . Pulmonary fibrosis ([place])   . Renal calculi 01/08/2014   frrom noted from [facility] .in chart  . Restless legs 01/08/2014  . Sciatic leg pain    paralysis of sciatic nerve  . Sleep apnea    CPAP/BIPAP  . Tinnitus     Past Surgical History:  Procedure Laterality Date  . ABDOMINAL SURGERY    . BACK SURGERY    . BLADDER REPAIR    . EYE SURGERY    . HERNIA REPAIR    . LEFT HEART  CATHETERIZATION WITH CORONARY ANGIOGRAM N/A 01/09/2015   Procedure: LEFT HEART CATHETERIZATION WITH CORONARY ANGIOGRAM;  Surgeon: Clent Demark, MD;  Location: [facility] CATH LAB;  Service: Cardiovascular;  Laterality: N/A;  . NEPHROURETERECTOMY  03/18/2016   with bladder cuff excision- from noted in chart from [facility]  . TOTAL HIP ARTHROPLASTY Left 02/10/2017   Procedure: LEFT TOTAL HIP ARTHROPLASTY ANTERIOR APPROACH;  Surgeon: Mcarthur Rossetti, MD;  Location[facility];  Service: Orthopedics;  Laterality: Left;    There were no vitals filed for this visit.  Subjective Assessment - 11/16/17 1257    Subjective  Pt states that his lower back is feeling better.     Pertinent History  current bladder cancer and undergoing treatments, on O2 ("6L when walking long distances"), COPD, DM, neuropathy, HTN, pulmonary fibrosis, arthritis    Limitations  Walking    How long can you sit comfortably?  no issues    How long can you stand comfortably?  no issues    How long can you walk comfortably?  no issues with SPC    Patient Stated Goals  get rid of SPC    Currently in Pain?  Yes    Pain Score  1     Pain Location  Back    Pain Orientation  Lower    Pain Descriptors / Indicators  Aching    Pain Type  Chronic pain    Pain Onset  More than a month ago    Pain Frequency  Intermittent    Aggravating Factors   moving to the L    Pain Relieving Factors  not sure    Effect of Pain on Daily Activities  increases           [facility] Adult PT Treatment/Exercise - 11/16/17 0001      Knee/Hip Exercises: Stretches   Gastroc Stretch  3 reps;30 seconds    Gastroc Stretch Limitations  slant board      Knee/Hip Exercises: Standing   Forward Lunges  Both;10 reps    Forward Lunges Limitations  4" step, BUE support    Forward Step Up  Both;10 reps;Hand Hold: 1;Step Height: 4"    Other Standing Knee Exercises  fwd tandem gait in // bars x2RT with bil 2 finger assist    Other Standing Knee Exercises  bil  tandem stance with head truns x10 each; bil tandem stance + UE flexion with 2# weight bar x5 reps each      Knee/Hip Exercises: Seated   Sit to Sand  10 reps;without UE support 2 sets          PT Education - 11/16/17 1257    Education provided  Yes    Education Details  exercise technique    Person(s) Educated  Patient    Methods  Explanation    Comprehension  Verbalized understanding       PT Short Term Goals - 11/14/17 1353      PT SHORT TERM GOAL #1   Title  Pt will be independent with HEP and perform consistently in order to maximize return to PLOF.    Time  3    Period  Weeks    Status  Achieved      PT SHORT TERM GOAL #2   Title  Pt will have 1/2 grade improvement in MMT of all muscle groups tested in order to maximize gait and balance.    Time  3    Period  Weeks    Status  On-going      PT SHORT TERM GOAL #3   Title  Pt will be able to perform 5xSTS in 10 sec or < with no UE to demo improved functional BLE strength and decrease risk for falls.    Time  3    Period  Weeks    Status  On-going      PT SHORT TERM GOAL #4   Title  Pt will be active in regular walking program 3x/week or > to maximize strength and overall QOL.    Baseline  12/18; hasn't started a regular walking program    Time  3    Period  Weeks    Status  On-going        PT Long Term Goals - 11/14/17 1354      PT LONG TERM GOAL #1   Title  Pt will have a 1 grade improvement in MMT of all muscle groups tested in order to decrease pain, maximize gait, and decrease risk for falls.     Time  6    Period  Weeks    Status  On-going      PT  LONG TERM GOAL #2   Title  Pt will be able to perform bil SLS for at least 6 sec or > with no UE to demo improved balance and functional strength of BLE in order to decrease his risk for falls and allow him to ambulate safely with LRAD.    Baseline  12/18: 15 sec BLE with bil 1 fingertip assist    Time  6    Period  Weeks    Status  On-going      PT  LONG TERM GOAL #3   Title  Pt will score at least 20/24 on the DGI to demo improved overall balance and decrease his risk for falling.    Time  6    Period  Weeks    Status  On-going      PT LONG TERM GOAL #4   Title  Pt will be able to perform TUG with LRAD in 12 sec or < to demo improved functional mobility and overall balance.    Baseline  12/18: 11. 7 sec on 2nd visit with SPC    Time  6    Period  Weeks    Status  Achieved      PT LONG TERM GOAL #5   Title  Pt will have improved 3MWT by 171ft or > with LRAD to demo improved functional strength, endurance, and balance in order to maximize community access and participation.    Time  6    Period  Weeks    Status  On-going            Plan - 11/16/17 1339    Clinical Impression Statement  Pt on 2L O2 throughout entire session. He reports much improved LBP symptoms today. Continued with current POC addressing balance, core and glute strength. He tolerated well but continues to require rest breaks due to SOB. O2 saturation ranged from 85% to 94% throughout session.     Rehab Potential  Fair    PT Frequency  2x / week    PT Duration  6 weeks    PT Treatment/Interventions  ADLs/Self Care Home Management;Cryotherapy;Electrical Stimulation;Moist Heat;DME Instruction;Gait training;Stair training;Functional mobility training;Therapeutic activities;Therapeutic exercise;Balance training;Neuromuscular re-education;Patient/family education;Manual techniques;Scar mobilization;Passive range of motion;Dry needling;Energy conservation;Taping    PT Next Visit Plan  DGI for reassessment; Continue with current program do not add until pt no longer needs rest breaks.      PT Home Exercise Plan  added STS and walking program in house    Consulted and Agree with Plan of Care  Patient       Patient will benefit from skilled therapeutic intervention in order to improve the following deficits and impairments:  Abnormal gait, Cardiopulmonary status  limiting activity, Decreased activity tolerance, Decreased balance, Decreased endurance, Decreased mobility, Decreased range of motion, Decreased strength, Difficulty walking, Hypomobility, Increased fascial restricitons, Increased muscle spasms, Impaired flexibility, Impaired sensation, Improper body mechanics, Obesity, Pain  Visit Diagnosis: Pain in left hip  Pain in right hip  Muscle weakness (generalized)  Unsteadiness on feet     Problem List Patient Active Problem List   Diagnosis Date Noted  . Unilateral primary osteoarthritis, left hip 02/10/2017  . 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  . H/O total hip arthroplasty, left 02/10/2017  . History of total hip arthroplasty 02/10/2017  . Respiratory failure ([place])   . OSA (obstructive sleep apnea)   . Pain in the chest   . Chronic systolic heart failure ([place])   . Esophageal reflux   .  Chest pain 01/08/2015  . Insulin dependent diabetes mellitus ([place]) 01/08/2015  . COPD (chronic obstructive pulmonary disease) ([place]) 01/08/2015  . Hyperlipidemia associated with type 2 diabetes mellitus ([place]) 01/08/2015       Geraldine Solar PT, DPT  [place] [address] [place], [place], [address] Phone: [phone]   Fax:  [phone]  Name: Ricky Johnston MRN: [id number] Date of Birth: 24-Mar-1951

## 2017-11-23 ENCOUNTER — Encounter (HOSPITAL_COMMUNITY): Payer: Self-pay

## 2017-11-23 ENCOUNTER — Telehealth (HOSPITAL_COMMUNITY): Payer: Self-pay | Admitting: Internal Medicine

## 2017-11-23 ENCOUNTER — Ambulatory Visit (HOSPITAL_COMMUNITY): Payer: Medicare Other

## 2017-11-23 ENCOUNTER — Other Ambulatory Visit: Payer: Self-pay

## 2017-11-23 DIAGNOSIS — M6281 Muscle weakness (generalized): Secondary | ICD-10-CM

## 2017-11-23 DIAGNOSIS — M25551 Pain in right hip: Secondary | ICD-10-CM

## 2017-11-23 DIAGNOSIS — R2681 Unsteadiness on feet: Secondary | ICD-10-CM

## 2017-11-23 DIAGNOSIS — M25552 Pain in left hip: Secondary | ICD-10-CM | POA: Diagnosis not present

## 2017-11-23 NOTE — Therapy (Signed)
[place], [place], [address] Phone: [phone]   Fax:  [phone]  Physical Therapy Treatment  Patient Details  Name: Ricky Johnston MRN: [id number] Date of Birth: 08-07-1951 Referring Provider: Jean Rosenthal, MD   Encounter Date: 11/23/2017  PT End of Session - 11/23/17 1601    Visit Number  8    Number of Visits  13    Date for PT Re-Evaluation  12/06/16    Authorization Type  Medicare; [employer] (g-codes done on 02/09/2023 visit)    Authorization Time Period  10/25/17 to 12/06/17    Authorization - Visit Number  8    Authorization - Number of Visits  10    PT Start Time  1350    PT Stop Time  1430    PT Time Calculation (min)  40 min    Activity Tolerance  Patient tolerated treatment well;No increased pain    Behavior During Therapy  WFL for tasks assessed/performed       Past Medical History:  Diagnosis Date  . Arthritis   . Cancer [facility])    bladder cancer currently 2016  . Complication of anesthesia    hard for him to wake up from Anesthesia from left nephrectomy  . COPD (chronic obstructive pulmonary disease) ([place])   . Diabetes mellitus without complication ([place])   . DVT (deep venous thrombosis) ([place]) 01/08/2014, 2010   upper extremity  . GERD (gastroesophageal reflux disease)   . Hallux limitus 05/18/2015   from notes from [place]   . Headache    migraines  . History of kidney stones   . Hypercholesterolemia   . Hypertension   . Impaired hearing   . Intervertebral disc syndrome   . Kidney stones   . Pulmonary fibrosis ([place])   . Renal calculi 01/08/2014   frrom noted from [facility] .in chart  . Restless legs 01/08/2014  . Sciatic leg pain    paralysis of sciatic nerve  . Sleep apnea    CPAP/BIPAP  . Tinnitus     Past Surgical History:  Procedure Laterality Date  . ABDOMINAL SURGERY    . BACK SURGERY    . BLADDER REPAIR    . EYE SURGERY    . HERNIA REPAIR    . LEFT HEART  CATHETERIZATION WITH CORONARY ANGIOGRAM N/A 01/09/2015   Procedure: LEFT HEART CATHETERIZATION WITH CORONARY ANGIOGRAM;  Surgeon: Clent Demark, MD;  Location[facility] C[facility] LAB;  Service: Cardiovascular;  Laterality: N/A;  . NEPHROURETERECTOMY  03/18/2016   with bladder cuff excision- from noted in chart from [facility]  . TOTAL HIP ARTHROPLASTY Left 02/10/2017   Procedure: LEFT TOTAL HIP ARTHROPLASTY ANTERIOR APPROACH;  Surgeon: Mcarthur Rossetti, MD;  Location: [facility] ORS;  Service: Orthopedics;  Laterality: Left;    There were no vitals filed for this visit.  Subjective Assessment - 11/23/17 1600    Subjective  Patient is feeling good today, he states his back is not too sroe and reports he was able to perform HEP during the holiday.    Pertinent History  current bladder cancer and undergoing treatments, on O2 ("6L when walking long distances"), COPD, DM, neuropathy, HTN, pulmonary fibrosis, arthritis    Limitations  Walking    How long can you sit comfortably?  no issues    How long can you stand comfortably?  no issues    How long can you walk comfortably?  no issues with SPC  Patient Stated Goals  get rid of SPC    Currently in Pain?  Yes    Pain Score  3     Pain Location  Back    Pain Orientation  Lower    Pain Descriptors / Indicators  Aching    Pain Type  Chronic pain    Pain Onset  More than a month ago    Pain Frequency  Intermittent        [facility] Adult PT Treatment/Exercise - 11/23/17 0001      Knee/Hip Exercises: Standing   Forward Lunges  Both;10 reps    Forward Lunges Limitations  4" step, BUE support pateint performing more of a stretch than strengthening.    Hip Extension  15 reps;Knee straight;Limitations    Extension Limitations  red theraband    Forward Step Up  Both;2 sets;10 reps;Step Height: 4";Hand Hold: 1    Other Standing Knee Exercises  fwd/retro tandem gait in // bars x 4RT with bil 2 finger assist      Knee/Hip Exercises: Seated   Sit to Sand  without  UE support;20 reps 2x 10 reps, second set with blue weighted ball        PT Education - 11/23/17 1601    Education provided  Yes    Education Details  exercise form and rest breaks, educated on pursed lip breathing during rest breaks    Person(s) Educated  Patient    Methods  Explanation    Comprehension  Verbalized understanding       PT Short Term Goals - 11/14/17 1353      PT SHORT TERM GOAL #1   Title  Pt will be independent with HEP and perform consistently in order to maximize return to PLOF.    Time  3    Period  Weeks    Status  Achieved      PT SHORT TERM GOAL #2   Title  Pt will have 1/2 grade improvement in MMT of all muscle groups tested in order to maximize gait and balance.    Time  3    Period  Weeks    Status  On-going      PT SHORT TERM GOAL #3   Title  Pt will be able to perform 5xSTS in 10 sec or < with no UE to demo improved functional BLE strength and decrease risk for falls.    Time  3    Period  Weeks    Status  On-going      PT SHORT TERM GOAL #4   Title  Pt will be active in regular walking program 3x/week or > to maximize strength and overall QOL.    Baseline  12/18; hasn't started a regular walking program    Time  3    Period  Weeks    Status  On-going        PT Long Term Goals - 11/14/17 1354      PT LONG TERM GOAL #1   Title  Pt will have a 1 grade improvement in MMT of all muscle groups tested in order to decrease pain, maximize gait, and decrease risk for falls.     Time  6    Period  Weeks    Status  On-going      PT LONG TERM GOAL #2   Title  Pt will be able to perform bil SLS for at least 6 sec or > with no UE to demo improved balance  and functional strength of BLE in order to decrease his risk for falls and allow him to ambulate safely with LRAD.    Baseline  12/18: 15 sec BLE with bil 1 fingertip assist    Time  6    Period  Weeks    Status  On-going      PT LONG TERM GOAL #3   Title  Pt will score at least 20/24 on the  DGI to demo improved overall balance and decrease his risk for falling.    Time  6    Period  Weeks    Status  On-going      PT LONG TERM GOAL #4   Title  Pt will be able to perform TUG with LRAD in 12 sec or < to demo improved functional mobility and overall balance.    Baseline  12/18: 11. 7 sec on 2nd visit with SPC    Time  6    Period  Weeks    Status  Achieved      PT LONG TERM GOAL #5   Title  Pt will have improved 3MWT by 175ft or > with LRAD to demo improved functional strength, endurance, and balance in order to maximize community access and participation.    Time  6    Period  Weeks    Status  On-going       Plan - 11/23/17 1602    Clinical Impression Statement  Patient remained on 2L O2 throughout session. He shows good safety awareness for rest breaks throughout exercise to prevent desaturation, SpO2 remained between 84%-97% today. He will continue to benefit from skilled PT services to address current impairments and continue to progress currrent POC to improve mobility and QOL.   Rehab Potential  Fair    PT Frequency  2x / week    PT Duration  6 weeks    PT Treatment/Interventions  ADLs/Self Care Home Management;Cryotherapy;Electrical Stimulation;Moist Heat;DME Instruction;Gait training;Stair training;Functional mobility training;Therapeutic activities;Therapeutic exercise;Balance training;Neuromuscular re-education;Patient/family education;Manual techniques;Scar mobilization;Passive range of motion;Dry needling;Energy conservation;Taping    PT Next Visit Plan  DGI for reassessment; Continue with current program do not add until pt no longer needs rest breaks.  Continue with functional strengthening for BLE.    PT Home Exercise Plan  added STS and walking program in house    Consulted and Agree with Plan of Care  Patient       Patient will benefit from skilled therapeutic intervention in order to improve the following deficits and impairments:  Abnormal gait,  Cardiopulmonary status limiting activity, Decreased activity tolerance, Decreased balance, Decreased endurance, Decreased mobility, Decreased range of motion, Decreased strength, Difficulty walking, Hypomobility, Increased fascial restricitons, Increased muscle spasms, Impaired flexibility, Impaired sensation, Improper body mechanics, Obesity, Pain  Visit Diagnosis: Pain in left hip  Pain in right hip  Muscle weakness (generalized)  Unsteadiness on feet     Problem List Patient Active Problem List   Diagnosis Date Noted  . Unilateral primary osteoarthritis, left hip 02/10/2017  . 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  . H/O total hip arthroplasty, left 02/10/2017  . History of total hip arthroplasty 02/10/2017  . Respiratory failure ([place])   . OSA (obstructive sleep apnea)   . Pain in the chest   . Chronic systolic heart failure ([place])   . Esophageal reflux   . Chest pain 01/08/2015  . Insulin dependent diabetes mellitus ([place]) 01/08/2015  . COPD (chronic obstructive pulmonary disease) ([place]) 01/08/2015  . Hyperlipidemia associated with  type 2 diabetes mellitus ([place]) 01/08/2015    Kipp Brood, PT, DPT Physical Therapist with [facility]  11/23/2017 4:08 PM    [place], [place], [address] Phone: [phone]   Fax:  [phone]  Name: Jojo Pehl MRN: [id number] Date of Birth: June 08, 1951

## 2017-11-23 NOTE — Telephone Encounter (Signed)
11/23/17  Pt has an appt with the [place] on 1/17 and we rescheduled this appt.

## 2017-11-30 ENCOUNTER — Ambulatory Visit (HOSPITAL_COMMUNITY): Payer: Medicare Other | Attending: Orthopaedic Surgery

## 2017-11-30 VITALS — HR 104

## 2017-11-30 DIAGNOSIS — M6281 Muscle weakness (generalized): Secondary | ICD-10-CM | POA: Insufficient documentation

## 2017-11-30 DIAGNOSIS — M25551 Pain in right hip: Secondary | ICD-10-CM | POA: Diagnosis present

## 2017-11-30 DIAGNOSIS — R2681 Unsteadiness on feet: Secondary | ICD-10-CM | POA: Diagnosis present

## 2017-11-30 DIAGNOSIS — M25552 Pain in left hip: Secondary | ICD-10-CM | POA: Diagnosis present

## 2017-11-30 NOTE — Therapy (Signed)
[place], [place], [address] Phone: [phone]   Fax:  [phone]  Physical Therapy Treatment  Patient Details  Name: Ricky Johnston MRN: [id number] Date of Birth: 07/26/1951 Referring Provider: Jean Rosenthal, MD   Encounter Date: 11/30/2017  PT End of Session - 11/30/17 1330    Visit Number  9    Number of Visits  13    Date for PT Re-Evaluation  12/06/16    Authorization Type  Medicare; [employer] (g-codes done on 2023/02/24 visit)    Authorization Time Period  10/25/17 to 12/06/17    Authorization - Visit Number  9    Authorization - Number of Visits  19    PT Start Time  1309    PT Stop Time  2706    PT Time Calculation (min)  38 min    Activity Tolerance  Patient tolerated treatment well;No increased pain;Patient limited by fatigue uses 4LPM on [place]     Behavior During Therapy  [facility] for tasks assessed/performed       Past Medical History:  Diagnosis Date  . Arthritis   . Cancer [facility])    bladder cancer currently 2016  . Complication of anesthesia    hard for him to wake up from Anesthesia from left nephrectomy  . COPD (chronic obstructive pulmonary disease) ([place])   . Diabetes mellitus without complication ([place])   . DVT (deep venous thrombosis) ([place]) 01/08/2014, 2010   upper extremity  . GERD (gastroesophageal reflux disease)   . Hallux limitus 05/18/2015   from notes from [place]   . Headache    migraines  . History of kidney stones   . Hypercholesterolemia   . Hypertension   . Impaired hearing   . Intervertebral disc syndrome   . Kidney stones   . Pulmonary fibrosis ([place])   . Renal calculi 01/08/2014   frrom noted from [facility] .in chart  . Restless legs 01/08/2014  . Sciatic leg pain    paralysis of sciatic nerve  . Sleep apnea    CPAP/BIPAP  . Tinnitus     Past Surgical History:  Procedure Laterality Date  . ABDOMINAL SURGERY    . BACK SURGERY    . BLADDER REPAIR    . EYE SURGERY     . HERNIA REPAIR    . LEFT HEART CATHETERIZATION WITH CORONARY ANGIOGRAM N/A 01/09/2015   Procedure: LEFT HEART CATHETERIZATION WITH CORONARY ANGIOGRAM;  Surgeon: Clent Demark, MD;  Location: [facility] CATH LAB;  Service: Cardiovascular;  Laterality: N/A;  . NEPHROURETERECTOMY  03/18/2016   with bladder cuff excision- from noted in chart from [facility]  . TOTAL HIP ARTHROPLASTY Left 02/10/2017   Procedure: LEFT TOTAL HIP ARTHROPLASTY ANTERIOR APPROACH;  Surgeon: Mcarthur Rossetti, MD;  [facility];  Service: Orthopedics;  Laterality: Left;    Vitals:   11/30/17 1314  Pulse: (!) 104  SpO2: 91%    Subjective Assessment - 11/30/17 1314    Subjective  Pt reports hes somewhat tired from watching football. Hes been working on putting up rails in home which has improved his mobility.     Pertinent History  current bladder cancer and undergoing treatments, on O2 ("6L when walking long distances"), COPD, DM, neuropathy, HTN, pulmonary fibrosis, arthritis    Currently in Pain?  Yes    Pain Score  3     Pain Location  Back    Pain Orientation  Proximal    Pain  Descriptors / Indicators  Aching         [facility] PT Assessment - 11/30/17 0001      Dynamic Gait Index   Level Surface  Mild Impairment    Change in Gait Speed  Moderate Impairment    Gait with Horizontal Head Turns  Mild Impairment    Gait with Vertical Head Turns  Mild Impairment    Gait and Pivot Turn  Mild Impairment    Step Over Obstacle  Mild Impairment    Step Around Obstacles  Mild Impairment    Steps  Mild Impairment    Total Score  15                  [facility] Adult PT Treatment/Exercise - 11/30/17 0001      Knee/Hip Exercises: Standing   Lateral Step Up  2 sets;Hand Hold: 1;Both;Step Height: 4" rest between to allow for O2Sats to return.     Forward Step Up  Both;2 sets;10 reps;Step Height: 4";Hand Hold: 1 encouraged to attempt zero hands to no hands                PT Short Term Goals -  11/14/17 1353      PT SHORT TERM GOAL #1   Title  Pt will be independent with HEP and perform consistently in order to maximize return to PLOF.    Time  3    Period  Weeks    Status  Achieved      PT SHORT TERM GOAL #2   Title  Pt will have 1/2 grade improvement in MMT of all muscle groups tested in order to maximize gait and balance.    Time  3    Period  Weeks    Status  On-going      PT SHORT TERM GOAL #3   Title  Pt will be able to perform 5xSTS in 10 sec or < with no UE to demo improved functional BLE strength and decrease risk for falls.    Time  3    Period  Weeks    Status  On-going      PT SHORT TERM GOAL #4   Title  Pt will be active in regular walking program 3x/week or > to maximize strength and overall QOL.    Baseline  12/18; hasn't started a regular walking program    Time  3    Period  Weeks    Status  On-going        PT Long Term Goals - 11/14/17 1354      PT LONG TERM GOAL #1   Title  Pt will have a 1 grade improvement in MMT of all muscle groups tested in order to decrease pain, maximize gait, and decrease risk for falls.     Time  6    Period  Weeks    Status  On-going      PT LONG TERM GOAL #2   Title  Pt will be able to perform bil SLS for at least 6 sec or > with no UE to demo improved balance and functional strength of BLE in order to decrease his risk for falls and allow him to ambulate safely with LRAD.    Baseline  12/18: 15 sec BLE with bil 1 fingertip assist    Time  6    Period  Weeks    Status  On-going      PT LONG TERM GOAL #3   Title  Pt will score at least 20/24 on the DGI to demo improved overall balance and decrease his risk for falling.    Time  6    Period  Weeks    Status  On-going      PT LONG TERM GOAL #4   Title  Pt will be able to perform TUG with LRAD in 12 sec or < to demo improved functional mobility and overall balance.    Baseline  12/18: 11. 7 sec on 2nd visit with SPC    Time  6    Period  Weeks    Status   Achieved      PT LONG TERM GOAL #5   Title  Pt will have improved 3MWT by 115ft or > with LRAD to demo improved functional strength, endurance, and balance in order to maximize community access and participation.    Time  6    Period  Weeks    Status  On-going            Plan - 2017-12-05 1332    Clinical Impression Statement  Pt continues to progress well thus far with continued functional strengthening and mobility conditioning. Pt tolerating activity with 4LPM and rest breaks. HR up to 119BPM in session, and SpO2 as low as 87% on 2LPM, but 89% or higher with 4LPM. Pt continues to demonstrate focal weakness in left hip extension and abduction in cliosed chain, but has good safety awarenss with adequate use of rails. and/or SPC. Progressed patient to anterior cone taps.     Rehab Potential  Fair    PT Frequency  2x / week    PT Duration  6 weeks    PT Treatment/Interventions  ADLs/Self Care Home Management;Cryotherapy;Electrical Stimulation;Moist Heat;DME Instruction;Gait training;Stair training;Functional mobility training;Therapeutic activities;Therapeutic exercise;Balance training;Neuromuscular re-education;Patient/family education;Manual techniques;Scar mobilization;Passive range of motion;Dry needling;Energy conservation;Taping    PT Next Visit Plan  DGI for reassessment; Continue with current program do not add until pt no longer needs rest breaks.  Continue with functional strengthening for BLE.    PT Home Exercise Plan  no changes this session    Consulted and Agree with Plan of Care  Patient       Patient will benefit from skilled therapeutic intervention in order to improve the following deficits and impairments:  Abnormal gait, Cardiopulmonary status limiting activity, Decreased activity tolerance, Decreased balance, Decreased endurance, Decreased mobility, Decreased range of motion, Decreased strength, Difficulty walking, Hypomobility, Increased fascial restricitons, Increased  muscle spasms, Impaired flexibility, Impaired sensation, Improper body mechanics, Obesity, Pain  Visit Diagnosis: Pain in left hip  Pain in right hip  Muscle weakness (generalized)  Unsteadiness on feet   G-Codes - 12-05-2017 1353    Functional Assessment Tool Used (Outpatient Only)  clinical judgement, MMT, 5xSTS, SLS, DGI    Functional Limitation  Mobility: Walking and moving around    Mobility: Walking and Moving Around Current Status [phone])  At least 40 percent but less than 60 percent impaired, limited or restricted    Mobility: Walking and Moving Around Goal Status [phone])  At least 20 percent but less than 40 percent impaired, limited or restricted       Problem List Patient Active Problem List   Diagnosis Date Noted  . Unilateral primary osteoarthritis, left hip 02/10/2017  . 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  . H/O total hip arthroplasty, left 02/10/2017  . History of total hip arthroplasty 02/10/2017  . Respiratory failure ([place])   . OSA (obstructive sleep apnea)   .  Pain in the chest   . Chronic systolic heart failure ([place])   . Esophageal reflux   . Chest pain 01/08/2015  . Insulin dependent diabetes mellitus ([place]) 01/08/2015  . COPD (chronic obstructive pulmonary disease) ([place]) 01/08/2015  . Hyperlipidemia associated with type 2 diabetes mellitus ([place]) 01/08/2015   1:54 PM, 11/30/17 Etta Grandchild, PT, DPT Physical Therapist at [place] [phone] (office)      Etta Grandchild 11/30/2017, 1:54 PM  [place] [address] [place], [place], [address] Phone: [phone]   Fax:  [phone]  Name: Josh Nicolosi MRN: [id number] Date of Birth: [birth year]

## 2017-12-05 ENCOUNTER — Encounter (HOSPITAL_COMMUNITY): Payer: Self-pay

## 2017-12-05 ENCOUNTER — Ambulatory Visit (HOSPITAL_COMMUNITY): Payer: Medicare Other

## 2017-12-05 DIAGNOSIS — M25552 Pain in left hip: Secondary | ICD-10-CM | POA: Diagnosis not present

## 2017-12-05 DIAGNOSIS — M25551 Pain in right hip: Secondary | ICD-10-CM

## 2017-12-05 DIAGNOSIS — R2681 Unsteadiness on feet: Secondary | ICD-10-CM

## 2017-12-05 DIAGNOSIS — M6281 Muscle weakness (generalized): Secondary | ICD-10-CM

## 2017-12-05 NOTE — Therapy (Signed)
[place], [place], [address] Phone: [phone]   Fax:  [phone]  Physical Therapy Treatment/Reassessment  Patient Details  Name: Ricky Johnston MRN: [id number] Date of Birth: 1951-03-13 Referring Provider: Jean Rosenthal, MD   Encounter Date: 12/05/2017  PT End of Session - 12/05/17 1344    Visit Number  10    Number of Visits  21    Date for PT Re-Evaluation  01/02/18    Authorization Type  Medicare; [employer]    Authorization Time Period  10/25/17 to 12/06/17; NEW: 12/05/17 to 01/02/18    Authorization - Visit Number  10    Authorization - Number of Visits  19    PT Start Time  8502    PT Stop Time  7741    PT Time Calculation (min)  40 min    Activity Tolerance  Patient tolerated treatment well;No increased pain;Patient limited by fatigue uses 4LPM on [place]     Behavior During Therapy  [facility] for tasks assessed/performed       Past Medical History:  Diagnosis Date  . Arthritis   . Cancer [facility])    bladder cancer currently 2016  . Complication of anesthesia    hard for him to wake up from Anesthesia from left nephrectomy  . COPD (chronic obstructive pulmonary disease) ([place])   . Diabetes mellitus without complication ([place])   . DVT (deep venous thrombosis) ([place]) 01/08/2014, 2010   upper extremity  . GERD (gastroesophageal reflux disease)   . Hallux limitus 05/18/2015   from notes from [place]   . Headache    migraines  . History of kidney stones   . Hypercholesterolemia   . Hypertension   . Impaired hearing   . Intervertebral disc syndrome   . Kidney stones   . Pulmonary fibrosis ([place])   . Renal calculi 01/08/2014   frrom noted from [facility] .in chart  . Restless legs 01/08/2014  . Sciatic leg pain    paralysis of sciatic nerve  . Sleep apnea    CPAP/BIPAP  . Tinnitus     Past Surgical History:  Procedure Laterality Date  . ABDOMINAL SURGERY    . BACK SURGERY    . BLADDER REPAIR    . EYE  SURGERY    . HERNIA REPAIR    . LEFT HEART CATHETERIZATION WITH CORONARY ANGIOGRAM N/A 01/09/2015   Procedure: LEFT HEART CATHETERIZATION WITH CORONARY ANGIOGRAM;  Surgeon: Clent Demark, MD;  Location: [facility] CATH LAB;  Service: Cardiovascular;  Laterality: N/A;  . NEPHROURETERECTOMY  03/18/2016   with bladder cuff excision- from noted in chart from [facility]  . TOTAL HIP ARTHROPLASTY Left 02/10/2017   Procedure: LEFT TOTAL HIP ARTHROPLASTY ANTERIOR APPROACH;  Surgeon: Mcarthur Rossetti, MD;  Location: [facility];  Service: Orthopedics;  Laterality: Left;    There were no vitals filed for this visit.  Subjective Assessment - 12/05/17 1344    Subjective  Pt states that he's doing alright today. He denies any pain.     Pertinent History  current bladder cancer and undergoing treatments, on O2 ("6L when walking long distances"), COPD, DM, neuropathy, HTN, pulmonary fibrosis, arthritis    Currently in Pain?  No/denies         [facility] PT Assessment - 12/05/17 0001      Strength   Right Hip ABduction  4+/5 was 4+    Left Hip Flexion  4+/5 was 4+    Left Hip  Internal Rotation  5/5 was 4+    Left Hip ABduction  3+/5 was 3-    Right Ankle Dorsiflexion  5/5 was 4    Left Ankle Dorsiflexion  4/5 was 4-      Ambulation/Gait   Ambulation/Gait  Yes    Ambulation Distance (Feet)  226 Feet was 224f    Assistive device  Straight cane;Other (Comment) 2L O2    Gait Comments  pt required 1 seated rest break after 1 lap and used remaining time for rest due to SOB      Balance   Balance Assessed  Yes      Static Standing Balance   Static Standing - Balance Support  No upper extremity supported    Static Standing Balance -  Activities   Single Leg Stance - Right Leg;Single Leg Stance - Left Leg    Static Standing - Comment/# of Minutes  R: 15 sec with 1 fingertip assist; L: 4 sec or < with 1 fingertip assist      Standardized Balance Assessment   Standardized Balance Assessment  Five Times Sit to  Stand;Dynamic Gait Index;Timed Up and Go Test    Five times sit to stand comments   6.5 sec, no UE, from chair      Dynamic Gait Index   Level Surface  Mild Impairment    Change in Gait Speed  Moderate Impairment    Gait with Horizontal Head Turns  Mild Impairment    Gait with Vertical Head Turns  Mild Impairment    Gait and Pivot Turn  Mild Impairment    Step Over Obstacle  Mild Impairment    Step Around Obstacles  Mild Impairment    Steps  Mild Impairment    Total Score  15    DGI comment:  --        PT Education - 12/05/17 1424    Education provided  Yes    Education Details  reassessment findings    Person(s) Educated  Patient    Methods  Explanation;Demonstration    Comprehension  Verbalized understanding;Returned demonstration       PT Short Term Goals - 12/05/17 1347      PT SHORT TERM GOAL #1   Title  Pt will be independent with HEP and perform consistently in order to maximize return to PLOF.    Time  3    Period  Weeks    Status  Achieved      PT SHORT TERM GOAL #2   Title  Pt will have 1/2 grade improvement in MMT of all muscle groups tested in order to maximize gait and balance.    Time  3    Period  Weeks    Status  Partially Met      PT SHORT TERM GOAL #3   Title  Pt will be able to perform 5xSTS in 10 sec or < with no UE to demo improved functional BLE strength and decrease risk for falls.    Baseline  1/8: 6.5 sec    Time  3    Period  Weeks    Status  Achieved      PT SHORT TERM GOAL #4   Title  Pt will be active in regular walking program 3x/week or > to maximize strength and overall QOL.    Baseline  1/8: participating in regular walking program 3x/week, 2x/day    Time  3    Period  Weeks    Status  Achieved        PT Long Term Goals - 12/05/17 1348      PT LONG TERM GOAL #1   Title  Pt will have a 1 grade improvement in MMT of all muscle groups tested in order to decrease pain, maximize gait, and decrease risk for falls.     Time  6     Period  Weeks    Status  Partially Met      PT LONG TERM GOAL #2   Title  Pt will be able to perform bil SLS for at least 6 sec or > with no UE to demo improved balance and functional strength of BLE in order to decrease his risk for falls and allow him to ambulate safely with LRAD.    Baseline  12/18: 15 sec RLE and 4 sec or < LLE with 1 fingertip assist    Time  6    Period  Weeks    Status  On-going      PT LONG TERM GOAL #3   Title  Pt will score at least 20/24 on the DGI to demo improved overall balance and decrease his risk for falling.    Baseline  1/8: 15/24 (performed on 11/30/17)    Time  6    Period  Weeks    Status  On-going      PT LONG TERM GOAL #4   Title  Pt will be able to perform TUG with LRAD in 12 sec or < to demo improved functional mobility and overall balance.    Baseline  12/18: 11. 7 sec on 2nd visit with SPC    Time  6    Period  Weeks    Status  Achieved      PT LONG TERM GOAL #5   Title  Pt will have improved 3MWT by 134f or > with LRAD to demo improved functional strength, endurance, and balance in order to maximize community access and participation.    Time  6    Period  Weeks    Status  On-going            Plan - 12/05/17 1646    Clinical Impression Statement  PT reassessed pt's goals and outcome measures this date. Pt has made great progress towards goals as illustrated above. His strength is progressing nicely, his functional strength has significantly improved AEB 5xSTS time, and his balance is steadily improving AEB is ability to maintain SLS on RLE for 15 sec with only 1 fingertip assist, however, this is still limited on the LLE. Pt's 3MWT has not yet improved, but this is likely due to pt's cardiopulm deficits, as he get SOB and needs a rest break, while on O2, but denies any pain or weakness during the test. PT feels pt would benefit from cardiopulm rehab to promote his cardiovascular endurance as it is one of his largest limitations in  clinic currently. PT also recommends extension of PT services to continue to address deficits in balance and overall strength in order to maximize pt's overall function, gait, and strength. PT agreeable for PT to send off referral for cardiopulm rehab.     Rehab Potential  Fair    PT Frequency  2x / week    PT Duration  4 weeks    PT Treatment/Interventions  ADLs/Self Care Home Management;Cryotherapy;Electrical Stimulation;Moist Heat;DME Instruction;Gait training;Stair training;Functional mobility training;Therapeutic activities;Therapeutic exercise;Balance training;Neuromuscular re-education;Patient/family education;Manual techniques;Scar mobilization;Passive range of motion;Dry needling;Energy conservation;Taping  PT Next Visit Plan  Continue with current program do not add until pt no longer needs rest breaks. Continue with functional strengthening for BLE. f/u on cardiopulm referral    PT Home Exercise Plan  no changes this session    Consulted and Agree with Plan of Care  Patient       Patient will benefit from skilled therapeutic intervention in order to improve the following deficits and impairments:  Abnormal gait, Cardiopulmonary status limiting activity, Decreased activity tolerance, Decreased balance, Decreased endurance, Decreased mobility, Decreased range of motion, Decreased strength, Difficulty walking, Hypomobility, Increased fascial restricitons, Increased muscle spasms, Impaired flexibility, Impaired sensation, Improper body mechanics, Obesity, Pain  Visit Diagnosis: Pain in left hip - Plan: PT plan of care cert/re-cert  Pain in right hip - Plan: PT plan of care cert/re-cert  Muscle weakness (generalized) - Plan: PT plan of care cert/re-cert  Unsteadiness on feet - Plan: PT plan of care cert/re-cert     Problem List Patient Active Problem List   Diagnosis Date Noted  . Unilateral primary osteoarthritis, left hip 02/10/2017  . Status post left hip replacement  02/10/2017  . H/O total hip arthroplasty, left 02/10/2017  . History of total hip arthroplasty 02/10/2017  . Respiratory failure ([place])   . OSA (obstructive sleep apnea)   . Pain in the chest   . Chronic systolic heart failure ([place])   . Esophageal reflux   . Chest pain 01/08/2015  . Insulin dependent diabetes mellitus ([place]) 01/08/2015  . COPD (chronic obstructive pulmonary disease) ([place]) 01/08/2015  . Hyperlipidemia associated with type 2 diabetes mellitus ([place]) 01/08/2015      Geraldine Solar PT, DPT  [facility] [address] [place], [place], [address] Phone: [phone]   Fax:  [phone]  Name: Daeron Carreno MRN: [id number] Date of Birth: 1951/10/15

## 2017-12-05 NOTE — Patient Instructions (Signed)
  SEATED MARCHING  While seated in a chair, lift up your foot and knee, set it down and then perform on the other leg. Repeat this alternating movement.   Perform 1-2x/day, 2-3 sets of 10-15 reps

## 2017-12-07 ENCOUNTER — Encounter (HOSPITAL_COMMUNITY): Payer: Self-pay

## 2017-12-07 ENCOUNTER — Ambulatory Visit (HOSPITAL_COMMUNITY): Payer: Medicare Other

## 2017-12-07 DIAGNOSIS — M25552 Pain in left hip: Secondary | ICD-10-CM

## 2017-12-07 DIAGNOSIS — M25551 Pain in right hip: Secondary | ICD-10-CM

## 2017-12-07 DIAGNOSIS — M6281 Muscle weakness (generalized): Secondary | ICD-10-CM

## 2017-12-07 DIAGNOSIS — R2681 Unsteadiness on feet: Secondary | ICD-10-CM

## 2017-12-07 NOTE — Therapy (Signed)
[place], [place], [address] Phone: [phone]   Fax:  [phone]  Physical Therapy Treatment  Patient Details  Name: Ricky Johnston MRN: [id number] Date of Birth: 02/05/1951 Referring Provider: Jean Rosenthal, MD   Encounter Date: 12/07/2017  PT End of Session - 12/07/17 1305    Visit Number  11    Number of Visits  21    Date for PT Re-Evaluation  01/02/18    Authorization Type  Medicare; [employer]    Authorization Time Period  10/25/17 to 12/06/17; NEW: 12/05/17 to 01/02/18    Authorization - Visit Number  11    Authorization - Number of Visits  19    PT Start Time  1300    PT Stop Time  1340    PT Time Calculation (min)  40 min    Activity Tolerance  Patient tolerated treatment well;No increased pain;Patient limited by fatigue uses 4LPM on [place]     Behavior During Therapy  [facility] for tasks assessed/performed       Past Medical History:  Diagnosis Date  . Arthritis   . Cancer [facility])    bladder cancer currently 2016  . Complication of anesthesia    hard for him to wake up from Anesthesia from left nephrectomy  . COPD (chronic obstructive pulmonary disease) ([place])   . Diabetes mellitus without complication ([place])   . DVT (deep venous thrombosis) ([place]) 01/08/2014, 2010   upper extremity  . GERD (gastroesophageal reflux disease)   . Hallux limitus 05/18/2015   from notes from [place]   . Headache    migraines  . History of kidney stones   . Hypercholesterolemia   . Hypertension   . Impaired hearing   . Intervertebral disc syndrome   . Kidney stones   . Pulmonary fibrosis ([place])   . Renal calculi 01/08/2014   frrom noted from [facility] .in chart  . Restless legs 01/08/2014  . Sciatic leg pain    paralysis of sciatic nerve  . Sleep apnea    CPAP/BIPAP  . Tinnitus     Past Surgical History:  Procedure Laterality Date  . ABDOMINAL SURGERY    . BACK SURGERY    . BLADDER REPAIR    . EYE SURGERY     . HERNIA REPAIR    . LEFT HEART CATHETERIZATION WITH CORONARY ANGIOGRAM N/A 01/09/2015   Procedure: LEFT HEART CATHETERIZATION WITH CORONARY ANGIOGRAM;  Surgeon: Clent Demark, MD;  Location: [facility] CATH LAB;  Service: Cardiovascular;  Laterality: N/A;  . NEPHROURETERECTOMY  03/18/2016   with bladder cuff excision- from noted in chart from [facility]  . TOTAL HIP ARTHROPLASTY Left 02/10/2017   Procedure: LEFT TOTAL HIP ARTHROPLASTY ANTERIOR APPROACH;  Surgeon: Mcarthur Rossetti, MD;  Location[facility];  Service: Orthopedics;  Laterality: Left;    There were no vitals filed for this visit.  Subjective Assessment - 12/07/17 1304    Subjective  Pt states that he's really tired today and his L hip is about a 6/10 currently. He was sore from last treatment session.    Pertinent History  current bladder cancer and undergoing treatments, on O2 ("6L when walking long distances"), COPD, DM, neuropathy, HTN, pulmonary fibrosis, arthritis    Currently in Pain?  Yes    Pain Score  6     Pain Location  Hip    Pain Orientation  Left    Pain Descriptors / Indicators  Sharp;Burning  Pain Type  Chronic pain    Pain Onset  More than a month ago    Pain Frequency  Intermittent    Aggravating Factors   moving to the L    Pain Relieving Factors  not sure    Effect of Pain on Daily Activities  increases          [facility] Adult PT Treatment/Exercise - 12/07/17 0001      Knee/Hip Exercises: Stretches   Gastroc Stretch  3 reps;30 seconds    Gastroc Stretch Limitations  slant board    Other Knee/Hip Stretches  3D hip excursions x10reps each      Knee/Hip Exercises: Standing   Hip Flexion  Both;15 reps    Forward Step Up  Both;15 reps;Hand Hold: 1;Step Height: 4"    Functional Squat  15 reps    Functional Squat Limitations  front loaded with blue weighted ball    Other Standing Knee Exercises  bil staggered stance (as close to tandem as possible) + OH flexion with blue weighted ball x10 reps each;  NBOS on firm with EC 5x10" holds, no UE    Other Standing Knee Exercises  deadlifts from 12" step with 8# DB x15 reps          PT Short Term Goals - 12/05/17 1347      PT SHORT TERM GOAL #1   Title  Pt will be independent with HEP and perform consistently in order to maximize return to PLOF.    Time  3    Period  Weeks    Status  Achieved      PT SHORT TERM GOAL #2   Title  Pt will have 1/2 grade improvement in MMT of all muscle groups tested in order to maximize gait and balance.    Time  3    Period  Weeks    Status  Partially Met      PT SHORT TERM GOAL #3   Title  Pt will be able to perform 5xSTS in 10 sec or < with no UE to demo improved functional BLE strength and decrease risk for falls.    Baseline  1/8: 6.5 sec    Time  3    Period  Weeks    Status  Achieved      PT SHORT TERM GOAL #4   Title  Pt will be active in regular walking program 3x/week or > to maximize strength and overall QOL.    Baseline  1/8: participating in regular walking program 3x/week, 2x/day    Time  3    Period  Weeks    Status  Achieved        PT Long Term Goals - 12/05/17 1348      PT LONG TERM GOAL #1   Title  Pt will have a 1 grade improvement in MMT of all muscle groups tested in order to decrease pain, maximize gait, and decrease risk for falls.     Time  6    Period  Weeks    Status  Partially Met      PT LONG TERM GOAL #2   Title  Pt will be able to perform bil SLS for at least 6 sec or > with no UE to demo improved balance and functional strength of BLE in order to decrease his risk for falls and allow him to ambulate safely with LRAD.    Baseline  12/18: 15 sec RLE and 4 sec or <  LLE with 1 fingertip assist    Time  6    Period  Weeks    Status  On-going      PT LONG TERM GOAL #3   Title  Pt will score at least 20/24 on the DGI to demo improved overall balance and decrease his risk for falling.    Baseline  1/8: 15/24 (performed on 11/30/17)    Time  6    Period  Weeks     Status  On-going      PT LONG TERM GOAL #4   Title  Pt will be able to perform TUG with LRAD in 12 sec or < to demo improved functional mobility and overall balance.    Baseline  12/18: 11. 7 sec on 2nd visit with SPC    Time  6    Period  Weeks    Status  Achieved      PT LONG TERM GOAL #5   Title  Pt will have improved 3MWT by 153f or > with LRAD to demo improved functional strength, endurance, and balance in order to maximize community access and participation.    Time  6    Period  Weeks    Status  On-going            Plan - 12/07/17 1341    Clinical Impression Statement  Pt on 3L O2 throughout session. His O2 levels ranged from 71-97% during session. Educated pt that PT reached out to his referring MD's office and left a message for them to call back regarding getting pt a pulmonary rehab referral. Session focused on glute strengthening and balance this date. He did well, requiring a few rest breaks due to SOB. Had to increase pt to 3L O2 towards EOS following staggered stance + UE flexion due to desatting to 71% and SOB. Vitals improved to high 90s after a few mins rest break on 4L. Continue as planned.    Rehab Potential  Fair    PT Frequency  2x / week    PT Duration  4 weeks    PT Treatment/Interventions  ADLs/Self Care Home Management;Cryotherapy;Electrical Stimulation;Moist Heat;DME Instruction;Gait training;Stair training;Functional mobility training;Therapeutic activities;Therapeutic exercise;Balance training;Neuromuscular re-education;Patient/family education;Manual techniques;Scar mobilization;Passive range of motion;Dry needling;Energy conservation;Taping    PT Next Visit Plan  Continue with current program do not add until pt no longer needs rest breaks. Continue with functional strengthening for BLE. f/u on cardiopulm referral    PT Home Exercise Plan  no changes this session    Consulted and Agree with Plan of Care  Patient       Patient will benefit from  skilled therapeutic intervention in order to improve the following deficits and impairments:  Abnormal gait, Cardiopulmonary status limiting activity, Decreased activity tolerance, Decreased balance, Decreased endurance, Decreased mobility, Decreased range of motion, Decreased strength, Difficulty walking, Hypomobility, Increased fascial restricitons, Increased muscle spasms, Impaired flexibility, Impaired sensation, Improper body mechanics, Obesity, Pain  Visit Diagnosis: Pain in left hip  Pain in right hip  Muscle weakness (generalized)  Unsteadiness on feet     Problem List Patient Active Problem List   Diagnosis Date Noted  . Unilateral primary osteoarthritis, left hip 02/10/2017  . 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  . H/O total hip arthroplasty, left 02/10/2017  . History of total hip arthroplasty 02/10/2017  . Respiratory failure ([place]   . OSA (obstructive sleep apnea)   . Pain in the chest   . Chronic systolic heart failure ([place]   .  Esophageal reflux   . Chest pain 01/08/2015  . Insulin dependent diabetes mellitus ([place]) 01/08/2015  . COPD (chronic obstructive pulmonary disease) ([place]) 01/08/2015  . Hyperlipidemia associated with type 2 diabetes mellitus ([place]) 01/08/2015       Geraldine Solar PT, DPT  [facility] [address] [place], [place], [address] Phone: [phone]   Fax:  [phone]  Name: Armando Bukhari MRN: [id number] Date of Birth: 12/02/1950

## 2017-12-08 ENCOUNTER — Telehealth (HOSPITAL_COMMUNITY): Payer: Self-pay

## 2017-12-08 NOTE — Telephone Encounter (Signed)
Called and spoke with Caryl Pina at Dr. Trevor Mace office regarding getting Mr. Deeley a referral to pulmonary rehab at [facility]. She stated that she would talk to Dr. Ninfa Linden and fax it over to [facility]. PT provided the fax number.   Geraldine Solar PT, DPT

## 2017-12-11 ENCOUNTER — Telehealth (INDEPENDENT_AMBULATORY_CARE_PROVIDER_SITE_OTHER): Payer: Self-pay

## 2017-12-11 NOTE — Telephone Encounter (Signed)
That will be fine. 

## 2017-12-11 NOTE — Telephone Encounter (Signed)
Brooke from [employer] calling asking if we can order pulmonary rehab referral ([facility]) for him. She states he does really well in therapy but his D stats go to the 70's with excercise quickly.   Fax to [phone] Call [phone]

## 2017-12-11 NOTE — Telephone Encounter (Signed)
Faxed to provided number  

## 2017-12-12 ENCOUNTER — Encounter (HOSPITAL_COMMUNITY): Payer: Self-pay

## 2017-12-12 ENCOUNTER — Telehealth (INDEPENDENT_AMBULATORY_CARE_PROVIDER_SITE_OTHER): Payer: Self-pay | Admitting: Orthopaedic Surgery

## 2017-12-12 ENCOUNTER — Ambulatory Visit (HOSPITAL_COMMUNITY): Payer: Medicare Other

## 2017-12-12 DIAGNOSIS — M25552 Pain in left hip: Secondary | ICD-10-CM | POA: Diagnosis not present

## 2017-12-12 DIAGNOSIS — R2681 Unsteadiness on feet: Secondary | ICD-10-CM

## 2017-12-12 DIAGNOSIS — M25551 Pain in right hip: Secondary | ICD-10-CM

## 2017-12-12 DIAGNOSIS — M6281 Muscle weakness (generalized): Secondary | ICD-10-CM

## 2017-12-12 NOTE — Therapy (Signed)
[place], [place], [address] Phone: [phone]   Fax:  [phone]  Physical Therapy Treatment  Patient Details  Name: Ricky Johnston MRN: [id number] Date of Birth: [birth year] Referring Provider: Jean Rosenthal, MD   Encounter Date: 12/12/2017  PT End of Session - 12/12/17 1303    Visit Number  12    Number of Visits  21    Date for PT Re-Evaluation  01/02/18    Authorization Type  Medicare; [employer]    Authorization Time Period  10/25/17 to 12/06/17; NEW: 12/05/17 to 01/02/18    Authorization - Visit Number  12    Authorization - Number of Visits  21    PT Start Time  1300    PT Stop Time  1346    PT Time Calculation (min)  46 min    Activity Tolerance  Patient tolerated treatment well;No increased pain;Patient limited by fatigue uses 4LPM on [place]     Behavior During Therapy  [facility] for tasks assessed/performed       Past Medical History:  Diagnosis Date  . Arthritis   . Cancer [facility])    bladder cancer currently 2016  . Complication of anesthesia    hard for him to wake up from Anesthesia from left nephrectomy  . COPD (chronic obstructive pulmonary disease) ([place])   . Diabetes mellitus without complication ([place])   . DVT (deep venous thrombosis) ([place]) 01/08/2014, 2010   upper extremity  . GERD (gastroesophageal reflux disease)   . Hallux limitus 05/18/2015   from notes from [place]   . Headache    migraines  . History of kidney stones   . Hypercholesterolemia   . Hypertension   . Impaired hearing   . Intervertebral disc syndrome   . Kidney stones   . Pulmonary fibrosis ([place])   . Renal calculi 01/08/2014   frrom noted from [facility] .in chart  . Restless legs 01/08/2014  . Sciatic leg pain    paralysis of sciatic nerve  . Sleep apnea    CPAP/BIPAP  . Tinnitus     Past Surgical History:  Procedure Laterality Date  . ABDOMINAL SURGERY    . BACK SURGERY    . BLADDER REPAIR    . EYE SURGERY     . HERNIA REPAIR    . LEFT HEART CATHETERIZATION WITH CORONARY ANGIOGRAM N/A 01/09/2015   Procedure: LEFT HEART CATHETERIZATION WITH CORONARY ANGIOGRAM;  Surgeon: Clent Demark, MD;  Location[facility] C[facility] LAB;  Service: Cardiovascular;  Laterality: N/A;  . NEPHROURETERECTOMY  03/18/2016   with bladder cuff excision- from noted in chart from [facility]  . TOTAL HIP ARTHROPLASTY Left 02/10/2017   Procedure: LEFT TOTAL HIP ARTHROPLASTY ANTERIOR APPROACH;  Surgeon: Mcarthur Rossetti, MD;  Location: [facility];  Service: Orthopedics;  Laterality: Left;    There were no vitals filed for this visit.  Subjective Assessment - 12/12/17 1304    Subjective  Pt states that his pain isn't that bad but his balance has been off since yesterday. He has not fallen but he has come close.    Pertinent History  current bladder cancer and undergoing treatments, on O2 ("6L when walking long distances"), COPD, DM, neuropathy, HTN, pulmonary fibrosis, arthritis    Currently in Pain?  No/denies    Pain Onset  More than a month ago          [facility] Adult PT Treatment/Exercise - 12/12/17 0001  Knee/Hip Exercises: Stretches   Passive Hamstring Stretch  Both;2 reps;30 seconds    Passive Hamstring Stretch Limitations  on 8" step    Gastroc Stretch  3 reps;30 seconds    Gastroc Stretch Limitations  slant board      Knee/Hip Exercises: Standing   Heel Raises  Both;15 reps    Heel Raises Limitations  heel and toe    Hip Abduction  Both;2 sets;10 reps    Abduction Limitations  GTB    Hip Extension  Both;2 sets;10 reps    Extension Limitations  GTB    SLS  BLE 3x30" with 1HHA    Rebounder  sidestepping with GTB 1f in // bars x3RT    Other Standing Knee Exercises  bil staggered stance +palov press with RTB 2 sets x15 reps each    Other Standing Knee Exercises  deadlifts from 12" step with 10# DB x15 reps           PT Education - 12/12/17 1304    Education provided  Yes    Education Details  exercise  technique     Person(s) Educated  Patient    Methods  Explanation;Demonstration    Comprehension  Verbalized understanding;Returned demonstration       PT Short Term Goals - 12/05/17 1347      PT SHORT TERM GOAL #1   Title  Pt will be independent with HEP and perform consistently in order to maximize return to PLOF.    Time  3    Period  Weeks    Status  Achieved      PT SHORT TERM GOAL #2   Title  Pt will have 1/2 grade improvement in MMT of all muscle groups tested in order to maximize gait and balance.    Time  3    Period  Weeks    Status  Partially Met      PT SHORT TERM GOAL #3   Title  Pt will be able to perform 5xSTS in 10 sec or < with no UE to demo improved functional BLE strength and decrease risk for falls.    Baseline  1/8: 6.5 sec    Time  3    Period  Weeks    Status  Achieved      PT SHORT TERM GOAL #4   Title  Pt will be active in regular walking program 3x/week or > to maximize strength and overall QOL.    Baseline  1/8: participating in regular walking program 3x/week, 2x/day    Time  3    Period  Weeks    Status  Achieved        PT Long Term Goals - 12/05/17 1348      PT LONG TERM GOAL #1   Title  Pt will have a 1 grade improvement in MMT of all muscle groups tested in order to decrease pain, maximize gait, and decrease risk for falls.     Time  6    Period  Weeks    Status  Partially Met      PT LONG TERM GOAL #2   Title  Pt will be able to perform bil SLS for at least 6 sec or > with no UE to demo improved balance and functional strength of BLE in order to decrease his risk for falls and allow him to ambulate safely with LRAD.    Baseline  12/18: 15 sec RLE and 4 sec or < LLE with 1  fingertip assist    Time  6    Period  Weeks    Status  On-going      PT LONG TERM GOAL #3   Title  Pt will score at least 20/24 on the DGI to demo improved overall balance and decrease his risk for falling.    Baseline  1/8: 15/24 (performed on 11/30/17)    Time   6    Period  Weeks    Status  On-going      PT LONG TERM GOAL #4   Title  Pt will be able to perform TUG with LRAD in 12 sec or < to demo improved functional mobility and overall balance.    Baseline  12/18: 11. 7 sec on 2nd visit with SPC    Time  6    Period  Weeks    Status  Achieved      PT LONG TERM GOAL #5   Title  Pt will have improved 3MWT by 110f or > with LRAD to demo improved functional strength, endurance, and balance in order to maximize community access and participation.    Time  6    Period  Weeks    Status  On-going            Plan - 12/12/17 1351    Clinical Impression Statement  Pt on 2-3L O2 during session. He reported increased balance difficulties this date with balance, which started yesterday. Began with strength training, with intermittent breaks due to SOB. O2 ranged from 75-95%. Per chart review, Dr. [facility]revor [facility]aceoffice sent referral to [facility] for pt to pursue pulmonary rehab to address pt's cardiopulm deficits. Focused on gluteal and functional strengthening as well as dynamic balance of bil hips this date. No reports of pain during session, just SOB.    Rehab Potential  Fair    PT Frequency  2x / week    PT Duration  4 weeks    PT Treatment/Interventions  ADLs/Self Care Home Management;Cryotherapy;Electrical Stimulation;Moist Heat;DME Instruction;Gait training;Stair training;Functional mobility training;Therapeutic activities;Therapeutic exercise;Balance training;Neuromuscular re-education;Patient/family education;Manual techniques;Scar mobilization;Passive range of motion;Dry needling;Energy conservation;Taping    PT Next Visit Plan  Continue with current program do not add until pt no longer needs rest breaks. Continue with functional strengthening for BLE. f/u on cardiopulm referral    PT Home Exercise Plan  no changes this session    Consulted and Agree with Plan of Care  Patient       Patient will benefit from skilled therapeutic  intervention in order to improve the following deficits and impairments:  Abnormal gait, Cardiopulmonary status limiting activity, Decreased activity tolerance, Decreased balance, Decreased endurance, Decreased mobility, Decreased range of motion, Decreased strength, Difficulty walking, Hypomobility, Increased fascial restricitons, Increased muscle spasms, Impaired flexibility, Impaired sensation, Improper body mechanics, Obesity, Pain  Visit Diagnosis: Pain in left hip  Pain in right hip  Muscle weakness (generalized)  Unsteadiness on feet     Problem List Patient Active Problem List   Diagnosis Date Noted  . Unilateral primary osteoarthritis, left hip 02/10/2017  . 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  . H/O total hip arthroplasty, left 02/10/2017  . History of total hip arthroplasty 02/10/2017  . Respiratory failure ([place]   . OSA (obstructive sleep apnea)   . Pain in the chest   . Chronic systolic heart failure ([place]   . Esophageal reflux   . Chest pain 01/08/2015  . Insulin dependent diabetes mellitus ([place] 01/08/2015  . COPD (chronic  obstructive pulmonary disease) ([place]) 01/08/2015  . Hyperlipidemia associated with type 2 diabetes mellitus ([place]) 01/08/2015      Geraldine Solar PT, DPT  [place] [address] [place], [place], [address] Phone: [phone]   Fax:  [phone]  Name: Ricky Johnston MRN: [id number] Date of Birth: 1951-03-15

## 2017-12-12 NOTE — Telephone Encounter (Signed)
Records faxed to [facility] for upcoming appt. Fax [phone]

## 2017-12-12 NOTE — Telephone Encounter (Signed)
Diane Code with [facility] pulmonary rehab was wondering if the referral was meant for them. They received a hand written referral along with all of the patients records. Can you please clarify if this was meant for them or physical therapy.  CB # [id number], leave a message if she doesn't answer.

## 2017-12-13 ENCOUNTER — Telehealth (INDEPENDENT_AMBULATORY_CARE_PROVIDER_SITE_OTHER): Payer: Self-pay

## 2017-12-13 NOTE — Telephone Encounter (Signed)
Ricky Johnston calling stating she needs PFT for patient before they can do the pulmonary rehab [facility] through respitory dept because he has COPD and because he is Medicare [phone]

## 2017-12-13 NOTE — Telephone Encounter (Signed)
Spoke with them to let them know this was meant for them she will send [place] a referral to sign

## 2017-12-13 NOTE — Telephone Encounter (Signed)
That will be fine, but he really needs this to be driven by his PCP or a Pulmonologist

## 2017-12-14 ENCOUNTER — Ambulatory Visit (HOSPITAL_COMMUNITY): Payer: Medicare Other | Admitting: Physical Therapy

## 2017-12-14 ENCOUNTER — Encounter (HOSPITAL_COMMUNITY): Payer: Self-pay | Admitting: Physical Therapy

## 2017-12-14 ENCOUNTER — Encounter (HOSPITAL_COMMUNITY): Payer: Medicare Other

## 2017-12-14 ENCOUNTER — Other Ambulatory Visit: Payer: Self-pay

## 2017-12-14 DIAGNOSIS — M25551 Pain in right hip: Secondary | ICD-10-CM

## 2017-12-14 DIAGNOSIS — M25552 Pain in left hip: Secondary | ICD-10-CM | POA: Diagnosis not present

## 2017-12-14 DIAGNOSIS — M6281 Muscle weakness (generalized): Secondary | ICD-10-CM

## 2017-12-14 DIAGNOSIS — R2681 Unsteadiness on feet: Secondary | ICD-10-CM

## 2017-12-14 NOTE — Therapy (Signed)
[place], [place], [address] Phone: [phone]   Fax:  [phone]  Physical Therapy Treatment  Patient Details  Name: Ricky Johnston MRN: [id number] Date of Birth: [birth year] Referring Provider: Jean Rosenthal, MD   Encounter Date: 12/14/2017  PT End of Session - 12/14/17 1037    Visit Number  13    Number of Visits  21    Date for PT Re-Evaluation  01/02/18    Authorization Type  Medicare; Tricare    Authorization Time Period  10/25/17 to 12/06/17; NEW: 12/05/17 to 01/02/18    Authorization - Visit Number  13    Authorization - Number of Visits  21    PT Start Time  0820 Patient arrived late    PT Stop Time  0900    PT Time Calculation (min)  40 min    Equipment Utilized During Treatment  Oxygen Patient was on 1-2 L of O2 throughout session    Activity Tolerance  Patient tolerated treatment well;No increased pain;Patient limited by fatigue Patient tolerated treatment well this session with rest breaks, once initial symptoms subsided.     Behavior During Therapy  [facility] for tasks assessed/performed       Past Medical History:  Diagnosis Date  . Arthritis   . Cancer [facility])    bladder cancer currently 2016  . Complication of anesthesia    hard for him to wake up from Anesthesia from left nephrectomy  . COPD (chronic obstructive pulmonary disease) ([place])   . Diabetes mellitus without complication ([place])   . DVT (deep venous thrombosis) ([place]) 01/08/2014, 2010   upper extremity  . GERD (gastroesophageal reflux disease)   . Hallux limitus 05/18/2015   from notes from [place]   . Headache    migraines  . History of kidney stones   . Hypercholesterolemia   . Hypertension   . Impaired hearing   . Intervertebral disc syndrome   . Kidney stones   . Pulmonary fibrosis ([place])   . Renal calculi 01/08/2014   frrom [facility] from [facility]in chart  . Restless legs 01/08/2014  . Sciatic leg pain    paralysis of  sciatic nerve  . Sleep apnea    CPAP/BIPAP  . Tinnitus     Past Surgical History:  Procedure Laterality Date  . ABDOMINAL SURGERY    . BACK SURGERY    . BLADDER REPAIR    . EYE SURGERY    . HERNIA REPAIR    . LEFT HEART CATHETERIZATION WITH CORONARY ANGIOGRAM N/A 01/09/2015   Procedure: LEFT HEART CATHETERIZATION WITH CORONARY ANGIOGRAM;  Surgeon: Clent Demark, MD;  Location: [facility] CATH LAB;  Service: Cardiovascular;  Laterality: N/A;  . NEPHROURETERECTOMY  03/18/2016   with bladder cuff excision- from noted in chart from [facility]  . TOTAL HIP ARTHROPLASTY Left 02/10/2017   Procedure: LEFT TOTAL HIP ARTHROPLASTY ANTERIOR APPROACH;  Surgeon: Mcarthur Rossetti, MD;  Location[facility];  Service: Orthopedics;  Laterality: Left;    There were no vitals filed for this visit.  Subjective Assessment - 12/14/17 1034    Subjective  Patient stated he is not having any pain today. Patient stated he was feeling off today, and that his vision was obscured for a moment in standing. After several minutes of resting patient stated he felt better and had no other symptoms.     Pertinent History  current bladder cancer and undergoing treatments, on O2 ("6L when walking  long distances"), COPD, DM, neuropathy, HTN, pulmonary fibrosis, arthritis    Currently in Pain?  No/denies                      [facility] Adult PT Treatment/Exercise - 12/14/17 0001      Knee/Hip Exercises: Stretches   Passive Hamstring Stretch  Both;2 reps;30 seconds    Passive Hamstring Stretch Limitations  on 8" step    Gastroc Stretch  3 reps;30 seconds    Gastroc Stretch Limitations  slant board      Knee/Hip Exercises: Standing   Heel Raises  Both;15 reps    Heel Raises Limitations  heel and toe    Hip Abduction  Both;2 sets;10 reps    Abduction Limitations  GTB    Hip Extension  Both;2 sets;10 reps    Extension Limitations  GTB    Forward Step Up  Both;15 reps;Hand Hold: 1;Step Height: 4"    Rebounder   sidestepping with GTB 55f in // bars x2RT      Knee/Hip Exercises: Seated   Sit to Sand  with UE support;20 reps Throughout session             PT Education - 12/14/17 1035    Education provided  Yes    Education Details  Patient was educated on following up with physician today about having a black spot in vision when standing this session, patient educated about exercise technique throughout session.     Person(s) Educated  Patient    Methods  Explanation    Comprehension  Verbalized understanding;Returned demonstration       PT Short Term Goals - 12/05/17 1347      PT SHORT TERM GOAL #1   Title  Pt will be independent with HEP and perform consistently in order to maximize return to PLOF.    Time  3    Period  Weeks    Status  Achieved      PT SHORT TERM GOAL #2   Title  Pt will have 1/2 grade improvement in MMT of all muscle groups tested in order to maximize gait and balance.    Time  3    Period  Weeks    Status  Partially Met      PT SHORT TERM GOAL #3   Title  Pt will be able to perform 5xSTS in 10 sec or < with no UE to demo improved functional BLE strength and decrease risk for falls.    Baseline  1/8: 6.5 sec    Time  3    Period  Weeks    Status  Achieved      PT SHORT TERM GOAL #4   Title  Pt will be active in regular walking program 3x/week or > to maximize strength and overall QOL.    Baseline  1/8: participating in regular walking program 3x/week, 2x/day    Time  3    Period  Weeks    Status  Achieved        PT Long Term Goals - 12/05/17 1348      PT LONG TERM GOAL #1   Title  Pt will have a 1 grade improvement in MMT of all muscle groups tested in order to decrease pain, maximize gait, and decrease risk for falls.     Time  6    Period  Weeks    Status  Partially Met      PT LONG TERM GOAL #2  Title  Pt will be able to perform bil SLS for at least 6 sec or > with no UE to demo improved balance and functional strength of BLE in order to  decrease his risk for falls and allow him to ambulate safely with LRAD.    Baseline  12/18: 15 sec RLE and 4 sec or < LLE with 1 fingertip assist    Time  6    Period  Weeks    Status  On-going      PT LONG TERM GOAL #3   Title  Pt will score at least 20/24 on the DGI to demo improved overall balance and decrease his risk for falling.    Baseline  1/8: 15/24 (performed on 11/30/17)    Time  6    Period  Weeks    Status  On-going      PT LONG TERM GOAL #4   Title  Pt will be able to perform TUG with LRAD in 12 sec or < to demo improved functional mobility and overall balance.    Baseline  12/18: 11. 7 sec on 2nd visit with SPC    Time  6    Period  Weeks    Status  Achieved      PT LONG TERM GOAL #5   Title  Pt will have improved 3MWT by 184f or > with LRAD to demo improved functional strength, endurance, and balance in order to maximize community access and participation.    Time  6    Period  Weeks    Status  On-going            Plan - 12/14/17 1039    Clinical Impression Statement  At beginning of the session patient reported feeling a little off and stated his vision was obscured by a black dot for a few moments in standing. Patient was returned to a chair and patient's blood pressure was found to be 130/70 and his oxygen saturation was found to be 86%. Patient rested for a several minutes and oxygen saturation was found to be 93% before beginning exercises and all symptoms were resolved. Patient tolerated all performed exercises well this session after symptoms resolved with frequent rest breaks. Shortened session today due to patient arriving several minutes late, time taken to assess patient's vital signs and symptoms and for rest breaks and tolerance. Patient was on 1-2 liters of oxygen throughout session and patient's SpO2 ranged between 84-93% throughout session, with rest breaks for SpO2 to return to 90 percent before performing exercises. Patient required cueing to perform  stretches with trunk more upright for better breathing, with abduction exercise to keep hip down. Patient was educated to follow-up with his physician today about the vision issue and to keep therapist posted. Patient would continue to benefit from skilled physical therapy to continue to progress endurance, improve lower extremity strength, and overall functional mobility.     Rehab Potential  Fair    PT Frequency  2x / week    PT Duration  4 weeks    PT Treatment/Interventions  ADLs/Self Care Home Management;Cryotherapy;Electrical Stimulation;Moist Heat;DME Instruction;Gait training;Stair training;Functional mobility training;Therapeutic activities;Therapeutic exercise;Balance training;Neuromuscular re-education;Patient/family education;Manual techniques;Scar mobilization;Passive range of motion;Dry needling;Energy conservation;Taping    PT Next Visit Plan  Follow-up about vision difficulty from this morning at next session, continue with current program or shortened program and do not add until pt no longer needs rest breaks. Continue with functional strengthening for BLE. f/u on cardiopulm referral  PT Home Exercise Plan  no changes this session    Consulted and Agree with Plan of Care  Patient       Patient will benefit from skilled therapeutic intervention in order to improve the following deficits and impairments:  Abnormal gait, Cardiopulmonary status limiting activity, Decreased activity tolerance, Decreased balance, Decreased endurance, Decreased mobility, Decreased range of motion, Decreased strength, Difficulty walking, Hypomobility, Increased fascial restricitons, Increased muscle spasms, Impaired flexibility, Impaired sensation, Improper body mechanics, Obesity, Pain  Visit Diagnosis: Pain in left hip  Pain in right hip  Muscle weakness (generalized)  Unsteadiness on feet     Problem List Patient Active Problem List   Diagnosis Date Noted  . Unilateral primary  osteoarthritis, left hip 02/10/2017  . 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  . H/O total hip arthroplasty, left 02/10/2017  . History of total hip arthroplasty 02/10/2017  . Respiratory failure ([place])   . OSA (obstructive sleep apnea)   . Pain in the chest   . Chronic systolic heart failure ([place])   . Esophageal reflux   . Chest pain 01/08/2015  . Insulin dependent diabetes mellitus ([place]) 01/08/2015  . COPD (chronic obstructive pulmonary disease) ([place]) 01/08/2015  . Hyperlipidemia associated with type 2 diabetes mellitus ([place]) 01/08/2015   Clarene Critchley PT, DPT 10:48 AM, 12/14/17 [place], [place], [address] Phone: [phone]   Fax:  [phone]  Name: Mannix Kroeker MRN: [id number] Date of Birth: 12/27/1950

## 2017-12-14 NOTE — Telephone Encounter (Signed)
Called and left VM with patient asking him to call me back with name of his PCP and Pulmonolgist so I can give the info to Diane

## 2017-12-19 ENCOUNTER — Other Ambulatory Visit: Payer: Self-pay

## 2017-12-19 ENCOUNTER — Ambulatory Visit (HOSPITAL_COMMUNITY): Payer: Medicare Other

## 2017-12-19 ENCOUNTER — Encounter (HOSPITAL_COMMUNITY): Payer: Self-pay

## 2017-12-19 DIAGNOSIS — R2681 Unsteadiness on feet: Secondary | ICD-10-CM

## 2017-12-19 DIAGNOSIS — M6281 Muscle weakness (generalized): Secondary | ICD-10-CM

## 2017-12-19 DIAGNOSIS — M25552 Pain in left hip: Secondary | ICD-10-CM | POA: Diagnosis not present

## 2017-12-19 DIAGNOSIS — M25551 Pain in right hip: Secondary | ICD-10-CM

## 2017-12-19 NOTE — Therapy (Signed)
[place], [place], [address] Phone: [phone]   Fax:  [phone]  Physical Therapy Treatment  Patient Details  Name: Ricky Johnston MRN: [id number] Date of Birth: 1951-11-04 Referring Provider: Jean Rosenthal, MD   Encounter Date: 12/19/2017  PT End of Session - 12/19/17 1134    Visit Number  14    Number of Visits  21    Date for PT Re-Evaluation  01/02/18    Authorization Type  Medicare; [employer]    Authorization Time Period  10/25/17 to 12/06/17; NEW: 12/05/17 to 01/02/18    Authorization - Visit Number  14    Authorization - Number of Visits  21    PT Start Time  1118    PT Stop Time  1201    PT Time Calculation (min)  43 min    Equipment Utilized During Treatment  Oxygen Patient was on 1 L of O2 throughout session    Activity Tolerance  Patient tolerated treatment well;No increased pain;Patient limited by fatigue Patient tolerated treatment well this session with rest breaks, once initial symptoms subsided.     Behavior During Therapy  [facility] for tasks assessed/performed       Past Medical History:  Diagnosis Date  . Arthritis   . Cancer [facility])    bladder cancer currently 2016  . Complication of anesthesia    hard for him to wake up from Anesthesia from left nephrectomy  . COPD (chronic obstructive pulmonary disease) ([place])   . Diabetes mellitus without complication ([place])   . DVT (deep venous thrombosis) ([place]) 01/08/2014, 2010   upper extremity  . GERD (gastroesophageal reflux disease)   . Hallux limitus 05/18/2015   from notes from [place]   . Headache    migraines  . History of kidney stones   . Hypercholesterolemia   . Hypertension   . Impaired hearing   . Intervertebral disc syndrome   . Kidney stones   . Pulmonary fibrosis ([place])   . Renal calculi 01/08/2014   frrom noted from [facility] .in chart  . Restless legs 01/08/2014  . Sciatic leg pain    paralysis of sciatic nerve  . Sleep  apnea    CPAP/BIPAP  . Tinnitus     Past Surgical History:  Procedure Laterality Date  . ABDOMINAL SURGERY    . BACK SURGERY    . BLADDER REPAIR    . EYE SURGERY    . HERNIA REPAIR    . LEFT HEART CATHETERIZATION WITH CORONARY ANGIOGRAM N/A 01/09/2015   Procedure: LEFT HEART CATHETERIZATION WITH CORONARY ANGIOGRAM;  Surgeon: Clent Demark, MD;  Location: [facility] CATH LAB;  Service: Cardiovascular;  Laterality: N/A;  . NEPHROURETERECTOMY  03/18/2016   with bladder cuff excision- from noted in chart from [facility]  . TOTAL HIP ARTHROPLASTY Left 02/10/2017   Procedure: LEFT TOTAL HIP ARTHROPLASTY ANTERIOR APPROACH;  Surgeon: Mcarthur Rossetti, MD;  Location: [facility];  Service: Orthopedics;  Laterality: Left;    There were no vitals filed for this visit.  Subjective Assessment - 12/19/17 1121    Subjective  Patient reports he feels like he pulled his left groin 3 days ago and had trouble gettign around this weekend because of that.     Pertinent History  current bladder cancer and undergoing treatments, on O2 ("6L when walking long distances"), COPD, DM, neuropathy, HTN, pulmonary fibrosis, arthritis    Currently in Pain?  Yes    Pain Score  6     Pain Orientation  Left    Pain Descriptors / Indicators  Aching    Pain Type  Acute pain    Pain Onset  In the past 7 days    Pain Frequency  Constant    Aggravating Factors   moving and walking, reissted adductino/flexion    Pain Relieving Factors  rest    Effect of Pain on Daily Activities  difficulty walking    Multiple Pain Sites  No      [facility] Adult PT Treatment/Exercise - 12/19/17 0001      Knee/Hip Exercises: Standing   Hip Flexion  Right;1 set;10 reps;Limitations    Hip Flexion Limitations  green theraband    Hip ADduction  Right;1 set;10 reps;Limitations    Hip ADduction Limitations  green theraband    Hip Abduction  Right;1 set;10 reps;Limitations    Abduction Limitations  green theraband    Hip Extension  Both;1 set;10  reps    Lateral Step Up  2 sets;Hand Hold: 1;Both;Step Height: 4"    Forward Step Up  Both;15 reps;Step Height: 4";Hand Hold: 2    Other Standing Knee Exercises  Modified tandem with 2# for BUE shuolder flexion, 1x 5 reps alternated foot position (10 total reps)      Knee/Hip Exercises: Seated   Sit to Sand  15 reps;with UE support       PT Education - 12/19/17 1127    Education provided  Yes    Education Details  Educated on rest brakes throuhgout session for safety to monitor cardiovascular endurance/fatigue. Educated patient to call Dr. Trevor Mace office to inform them he hasn't heard from pulmonary rehab and also to discuss the black spot clouding his vision last session. Educated on pursed lip breathing throughout session to elevate SpO2.    Person(s) Educated  Patient    Methods  Explanation    Comprehension  Verbalized understanding;Returned demonstration       PT Short Term Goals - 12/05/17 1347      PT SHORT TERM GOAL #1   Title  Pt will be independent with HEP and perform consistently in order to maximize return to PLOF.    Time  3    Period  Weeks    Status  Achieved      PT SHORT TERM GOAL #2   Title  Pt will have 1/2 grade improvement in MMT of all muscle groups tested in order to maximize gait and balance.    Time  3    Period  Weeks    Status  Partially Met      PT SHORT TERM GOAL #3   Title  Pt will be able to perform 5xSTS in 10 sec or < with no UE to demo improved functional BLE strength and decrease risk for falls.    Baseline  1/8: 6.5 sec    Time  3    Period  Weeks    Status  Achieved      PT SHORT TERM GOAL #4   Title  Pt will be active in regular walking program 3x/week or > to maximize strength and overall QOL.    Baseline  1/8: participating in regular walking program 3x/week, 2x/day    Time  3    Period  Weeks    Status  Achieved        PT Long Term Goals - 12/05/17 1348      PT LONG TERM GOAL #1   Title  Pt will have a 1 grade  improvement in MMT of all muscle groups tested in order to decrease pain, maximize gait, and decrease risk for falls.     Time  6    Period  Weeks    Status  Partially Met      PT LONG TERM GOAL #2   Title  Pt will be able to perform bil SLS for at least 6 sec or > with no UE to demo improved balance and functional strength of BLE in order to decrease his risk for falls and allow him to ambulate safely with LRAD.    Baseline  12/18: 15 sec RLE and 4 sec or < LLE with 1 fingertip assist    Time  6    Period  Weeks    Status  On-going      PT LONG TERM GOAL #3   Title  Pt will score at least 20/24 on the DGI to demo improved overall balance and decrease his risk for falling.    Baseline  1/8: 15/24 (performed on 11/30/17)    Time  6    Period  Weeks    Status  On-going      PT LONG TERM GOAL #4   Title  Pt will be able to perform TUG with LRAD in 12 sec or < to demo improved functional mobility and overall balance.    Baseline  12/18: 11. 7 sec on 2nd visit with SPC    Time  6    Period  Weeks    Status  Achieved      PT LONG TERM GOAL #5   Title  Pt will have improved 3MWT by 166f or > with LRAD to demo improved functional strength, endurance, and balance in order to maximize community access and participation.    Time  6    Period  Weeks    Status  On-going        Plan - 12/19/17 1135    Clinical Impression Statement  Patient arrived with left groin pain today and had pain with resisted hip abduction and flexion. He reported it was a puling sensation and feels like he pulled/strained his muscles. Exercises were limited to pain free for his left hip. HE was able to progress his functional strengthening with lateral step-ups and balance activities. HR and SpO2 were monitored throughout and patient remained below 90 bpm and between 86-94% throughout session with rest breaks throughout. Patient continues to require cues for pursed lip breathing to encourage exhaling through mouth to  increase normal expiration volume to allow for greater inspiration and more oxygen uptake. He will continue to benefit from skilled PT services to address current impairments and progress towards goals. He was educated today to contact his PCP to let them know he has not heard from pulmonary rehab yet.    Rehab Potential  Fair    PT Frequency  2x / week    PT Duration  4 weeks    PT Treatment/Interventions  ADLs/Self Care Home Management;Cryotherapy;Electrical Stimulation;Moist Heat;DME Instruction;Gait training;Stair training;Functional mobility training;Therapeutic activities;Therapeutic exercise;Balance training;Neuromuscular re-education;Patient/family education;Manual techniques;Scar mobilization;Passive range of motion;Dry needling;Energy conservation;Taping    PT Next Visit Plan  Provide phone number for pulmonary rehab if patient still hasn't heard back. Continue with current program or shortened program and do not add until pt no longer needs rest breaks. Continue with functional strengthening for BLE. f/u on cardiopulm referral     PT Home Exercise Plan  no  changes this session    Consulted and Agree with Plan of Care  Patient       Patient will benefit from skilled therapeutic intervention in order to improve the following deficits and impairments:  Abnormal gait, Cardiopulmonary status limiting activity, Decreased activity tolerance, Decreased balance, Decreased endurance, Decreased mobility, Decreased range of motion, Decreased strength, Difficulty walking, Hypomobility, Increased fascial restricitons, Increased muscle spasms, Impaired flexibility, Impaired sensation, Improper body mechanics, Obesity, Pain  Visit Diagnosis: Pain in left hip  Pain in right hip  Muscle weakness (generalized)  Unsteadiness on feet     Problem List Patient Active Problem List   Diagnosis Date Noted  . Unilateral primary osteoarthritis, left hip 02/10/2017  . Status post left hip replacement  02/10/2017  . H/O total hip arthroplasty, left 02/10/2017  . History of total hip arthroplasty 02/10/2017  . Respiratory failure ([place])   . OSA (obstructive sleep apnea)   . Pain in the chest   . Chronic systolic heart failure ([place])   . Esophageal reflux   . Chest pain 01/08/2015  . Insulin dependent diabetes mellitus ([place]) 01/08/2015  . COPD (chronic obstructive pulmonary disease) ([place]) 01/08/2015  . Hyperlipidemia associated with type 2 diabetes mellitus ([place]) 01/08/2015    Kipp Brood, PT, DPT Physical Therapist with [facility]  12/19/2017 12:18 PM    [place], [place], [address] Phone: [phone]   Fax:  [phone]  Name: Ricky Johnston MRN: [id number] Date of Birth: January 27, 1951

## 2017-12-19 NOTE — Telephone Encounter (Signed)
Have called a couple times and left messages for him to call back with the name of his PC and pulmonologist

## 2017-12-19 NOTE — Telephone Encounter (Signed)
Called and spoke with Ricky Johnston at rehab to ask her to help me get this information from him when he comes for his PT appointments

## 2017-12-21 ENCOUNTER — Telehealth (HOSPITAL_COMMUNITY): Payer: Self-pay | Admitting: Internal Medicine

## 2017-12-21 ENCOUNTER — Telehealth (HOSPITAL_COMMUNITY): Payer: Self-pay

## 2017-12-21 ENCOUNTER — Ambulatory Visit (HOSPITAL_COMMUNITY): Payer: Medicare Other

## 2017-12-21 NOTE — Telephone Encounter (Signed)
12/21/17  pt cx said that he thought he was coming down with the flu

## 2017-12-26 ENCOUNTER — Encounter (HOSPITAL_COMMUNITY): Payer: Medicare Other

## 2017-12-27 ENCOUNTER — Other Ambulatory Visit: Payer: Self-pay

## 2017-12-27 ENCOUNTER — Telehealth (HOSPITAL_COMMUNITY): Payer: Self-pay | Admitting: Internal Medicine

## 2017-12-27 ENCOUNTER — Encounter (HOSPITAL_COMMUNITY): Payer: Self-pay | Admitting: Physical Therapy

## 2017-12-27 ENCOUNTER — Ambulatory Visit (HOSPITAL_COMMUNITY): Payer: Medicare Other | Admitting: Physical Therapy

## 2017-12-27 DIAGNOSIS — R2681 Unsteadiness on feet: Secondary | ICD-10-CM

## 2017-12-27 DIAGNOSIS — M25551 Pain in right hip: Secondary | ICD-10-CM

## 2017-12-27 DIAGNOSIS — M25552 Pain in left hip: Secondary | ICD-10-CM | POA: Diagnosis not present

## 2017-12-27 DIAGNOSIS — M6281 Muscle weakness (generalized): Secondary | ICD-10-CM

## 2017-12-27 NOTE — Therapy (Signed)
[place], [place], [address] Phone: [phone]   Fax:  [phone]  Physical Therapy Treatment  Patient Details  Name: Ricky Johnston MRN: [id number] Date of Birth: May 30, 1951 Referring Provider: Jean Rosenthal, MD   Encounter Date: 12/27/2017  PT End of Session - 12/27/17 1844    Visit Number  15    Number of Visits  21    Date for PT Re-Evaluation  01/02/18    Authorization Type  Medicare; T[employer]are    Authorization Time Period  10/25/17 to 12/06/17; NEW: 12/05/17 to 01/02/18    Authorization - Visit Number  15    Authorization - Number of Visits  21    PT Start Time  0947    PT Stop Time  1026    PT Time Calculation (min)  39 min    Equipment Utilized During Treatment  Oxygen Patient was on 3 L of O2 throughout session    Activity Tolerance  Patient tolerated treatment well;No increased pain;Patient limited by fatigue Patient tolerated treatment well this session with rest breaks, once initial symptoms subsided.     Behavior During Therapy  [facility] for tasks assessed/performed       Past Medical History:  Diagnosis Date  . Arthritis   . Cancer [facility])    bladder cancer currently 2016  . Complication of anesthesia    hard for him to wake up from Anesthesia from left nephrectomy  . COPD (chronic obstructive pulmonary disease) ([place])   . Diabetes mellitus without complication ([place])   . DVT (deep venous thrombosis) ([place]) 01/08/2014, 2010   upper extremity  . GERD (gastroesophageal reflux disease)   . Hallux limitus 05/18/2015   from notes from [place]   . Headache    migraines  . History of kidney stones   . Hypercholesterolemia   . Hypertension   . Impaired hearing   . Intervertebral disc syndrome   . Kidney stones   . Pulmonary fibrosis ([place])   . Renal calculi 01/08/2014   frrom noted from [facility] .in chart  . Restless legs 01/08/2014  . Sciatic leg pain    paralysis of sciatic nerve  . Sleep  apnea    CPAP/BIPAP  . Tinnitus     Past Surgical History:  Procedure Laterality Date  . ABDOMINAL SURGERY    . BACK SURGERY    . BLADDER REPAIR    . EYE SURGERY    . HERNIA REPAIR    . LEFT HEART CATHETERIZATION WITH CORONARY ANGIOGRAM N/A 01/09/2015   Procedure: LEFT HEART CATHETERIZATION WITH CORONARY ANGIOGRAM;  Surgeon: Clent Demark, MD;  Location: [facility] CATH LAB;  Service: Cardiovascular;  Laterality: N/A;  . NEPHROURETERECTOMY  03/18/2016   with bladder cuff excision- from noted in chart from [facility]  . TOTAL HIP ARTHROPLASTY Left 02/10/2017   Procedure: LEFT TOTAL HIP ARTHROPLASTY ANTERIOR APPROACH;  Surgeon: Mcarthur Rossetti, MD;  [facility];  Service: Orthopedics;  Laterality: Left;    There were no vitals filed for this visit.  Subjective Assessment - 12/27/17 1842    Subjective  Patient reported today that he is not having any pain and that he feels good. Patient reported one spell of dizziness during session.     Pertinent History  current bladder cancer and undergoing treatments, on O2 ("6L when walking long distances"), COPD, DM, neuropathy, HTN, pulmonary fibrosis, arthritis    Currently in Pain?  No/denies  [place] Adult PT Treatment/Exercise - 12/27/17 0001      Knee/Hip Exercises: Standing   Hip Flexion  Right;1 set;Limitations;Left;15 reps    Hip Flexion Limitations  green theraband    Hip ADduction  Right;Left;15 reps;Limitations    Hip ADduction Limitations  green theraband    Hip Abduction  Right;Left;15 reps;1 set    Abduction Limitations  green theraband    Hip Extension  Right;Left;1 set;15 reps Green theraband    Lateral Step Up  2 sets;Hand Hold: 1;Both;Step Height: 4";10 reps    Forward Step Up  Both;15 reps;Step Height: 4";Hand Hold: 2      Knee/Hip Exercises: Seated   Sit to Sand  15 reps;without UE support             PT Education - 12/27/17 1843    Education provided  Yes    Education  Details  Patient educated on importance of taking breaks  throughout session and how to report level of fatigue. Patient educated on proper form and technique throughout session. Patient again educated about reaching out about pulmonary rehab.     Person(s) Educated  Patient    Methods  Explanation    Comprehension  Verbalized understanding       PT Short Term Goals - 12/05/17 1347      PT SHORT TERM GOAL #1   Title  Pt will be independent with HEP and perform consistently in order to maximize return to PLOF.    Time  3    Period  Weeks    Status  Achieved      PT SHORT TERM GOAL #2   Title  Pt will have 1/2 grade improvement in MMT of all muscle groups tested in order to maximize gait and balance.    Time  3    Period  Weeks    Status  Partially Met      PT SHORT TERM GOAL #3   Title  Pt will be able to perform 5xSTS in 10 sec or < with no UE to demo improved functional BLE strength and decrease risk for falls.    Baseline  1/8: 6.5 sec    Time  3    Period  Weeks    Status  Achieved      PT SHORT TERM GOAL #4   Title  Pt will be active in regular walking program 3x/week or > to maximize strength and overall QOL.    Baseline  1/8: participating in regular walking program 3x/week, 2x/day    Time  3    Period  Weeks    Status  Achieved        PT Long Term Goals - 12/05/17 1348      PT LONG TERM GOAL #1   Title  Pt will have a 1 grade improvement in MMT of all muscle groups tested in order to decrease pain, maximize gait, and decrease risk for falls.     Time  6    Period  Weeks    Status  Partially Met      PT LONG TERM GOAL #2   Title  Pt will be able to perform bil SLS for at least 6 sec or > with no UE to demo improved balance and functional strength of BLE in order to decrease his risk for falls and allow him to ambulate safely with LRAD.    Baseline  12/18: 15 sec RLE and 4 sec or < LLE with 1 fingertip  assist    Time  6    Period  Weeks    Status  On-going       PT LONG TERM GOAL #3   Title  Pt will score at least 20/24 on the DGI to demo improved overall balance and decrease his risk for falling.    Baseline  1/8: 15/24 (performed on 11/30/17)    Time  6    Period  Weeks    Status  On-going      PT LONG TERM GOAL #4   Title  Pt will be able to perform TUG with LRAD in 12 sec or < to demo improved functional mobility and overall balance.    Baseline  12/18: 11. 7 sec on 2nd visit with SPC    Time  6    Period  Weeks    Status  Achieved      PT LONG TERM GOAL #5   Title  Pt will have improved 3MWT by 122f or > with LRAD to demo improved functional strength, endurance, and balance in order to maximize community access and participation.    Time  6    Period  Weeks    Status  On-going            Plan - 12/27/17 1846    Clinical Impression Statement  This session patient did not report any groin pain and patient stated that he was feeling good. Session began with a continued focus on lower extremity strengthening and progressed to functional strengthening. Therapist provided adjusted resistance using green theraband throughout all exercises with theraband and provided cueing throughout. Patient reported some dizziness at one point in session. Patient was returned to sitting and his blood pressure was determined to be 126/60. Patient's SpO2 ranged from 84-97% throughout session on 3 Liters of oxygen. Exercises were discontinued until SpO2 returned to above 92%. Patient required frequent therapeutic rest breaks throughout session, but patient demonstrated continued motivation and determination to improve. Patient would benefit from continued skilled physical therapy in order to address continued deficits in strength, endurance, and overall functional mobility.     History and Personal Factors relevant to plan of care:  current bladder cancer and undergoing treatments, On O2 ("6L when walking long distances"), COPD, DM neuropathy, HTN, Pulmonary  fibrosis, arthritis     Rehab Potential  Fair    PT Frequency  2x / week    PT Duration  4 weeks    PT Treatment/Interventions  ADLs/Self Care Home Management;Cryotherapy;Electrical Stimulation;Moist Heat;DME Instruction;Gait training;Stair training;Functional mobility training;Therapeutic activities;Therapeutic exercise;Balance training;Neuromuscular re-education;Patient/family education;Manual techniques;Scar mobilization;Passive range of motion;Dry needling;Energy conservation;Taping    PT Next Visit Plan  Provide phone number for pulmonary rehab if patient still hasn't heard back. Continue with current program or shortened program and do not add until pt no longer needs rest breaks. Continue with functional strengthening for BLE.     PT Home Exercise Plan  no changes this session    Consulted and Agree with Plan of Care  Patient       Patient will benefit from skilled therapeutic intervention in order to improve the following deficits and impairments:  Abnormal gait, Cardiopulmonary status limiting activity, Decreased activity tolerance, Decreased balance, Decreased endurance, Decreased mobility, Decreased range of motion, Decreased strength, Difficulty walking, Hypomobility, Increased fascial restricitons, Increased muscle spasms, Impaired flexibility, Impaired sensation, Improper body mechanics, Obesity, Pain  Visit Diagnosis: Pain in left hip  Pain in right hip  Muscle weakness (generalized)  Unsteadiness on  feet     Problem List Patient Active Problem List   Diagnosis Date Noted  . Unilateral primary osteoarthritis, left hip 02/10/2017  . 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  . H/O total hip arthroplasty, left 02/10/2017  . History of total hip arthroplasty 02/10/2017  . Respiratory failure ([place])   . OSA (obstructive sleep apnea)   . Pain in the chest   . Chronic systolic heart failure ([place])   . Esophageal reflux   . Chest pain 01/08/2015  . Insulin dependent  diabetes mellitus ([place]) 01/08/2015  . COPD (chronic obstructive pulmonary disease) ([place]) 01/08/2015  . Hyperlipidemia associated with type 2 diabetes mellitus ([place]) 01/08/2015    Clarene Critchley PT, DPT 6:57 PM, 12/27/17 [place], [place], [address] Phone: [phone]   Fax:  [phone]  Name: Ricky Johnston MRN: [id number] Date of Birth: [birth year]

## 2017-12-28 ENCOUNTER — Encounter (HOSPITAL_COMMUNITY): Payer: Self-pay

## 2017-12-28 ENCOUNTER — Ambulatory Visit (HOSPITAL_COMMUNITY): Payer: Medicare Other

## 2017-12-28 DIAGNOSIS — M25552 Pain in left hip: Secondary | ICD-10-CM | POA: Diagnosis not present

## 2017-12-28 DIAGNOSIS — M6281 Muscle weakness (generalized): Secondary | ICD-10-CM

## 2017-12-28 DIAGNOSIS — R2681 Unsteadiness on feet: Secondary | ICD-10-CM

## 2017-12-28 DIAGNOSIS — M25551 Pain in right hip: Secondary | ICD-10-CM

## 2017-12-28 NOTE — Therapy (Signed)
[place], [place], [address] Phone: [phone]   Fax:  [phone]  Physical Therapy Treatment  Patient Details  Name: Ricky Johnston MRN: [id number] Date of Birth: [birth year] Referring Provider: Jean Rosenthal, MD   Encounter Date: 12/28/2017  PT End of Session - 12/28/17 1345    Visit Number  16    Number of Visits  21    Date for PT Re-Evaluation  01/02/18    Authorization Type  Medicare; [employer]    Authorization Time Period  10/25/17 to 12/06/17; NEW: 12/05/17 to 01/02/18    Authorization - Visit Number  16    Authorization - Number of Visits  21    PT Start Time  9326    Equipment Utilized During Treatment  Oxygen Patient was on 3 L of O2 throughout session    Activity Tolerance  Patient tolerated treatment well;No increased pain;Patient limited by fatigue Patient tolerated treatment well this session with rest breaks, once initial symptoms subsided.     Behavior During Therapy  [facility] for tasks assessed/performed       Past Medical History:  Diagnosis Date  . Arthritis   . Cancer [facility])    bladder cancer currently 2016  . Complication of anesthesia    hard for him to wake up from Anesthesia from left nephrectomy  . COPD (chronic obstructive pulmonary disease) ([place])   . Diabetes mellitus without complication ([place])   . DVT (deep venous thrombosis) ([place]) 01/08/2014, 2010   upper extremity  . GERD (gastroesophageal reflux disease)   . Hallux limitus 05/18/2015   from notes from [place]   . Headache    migraines  . History of kidney stones   . Hypercholesterolemia   . Hypertension   . Impaired hearing   . Intervertebral disc syndrome   . Kidney stones   . Pulmonary fibrosis ([place])   . Renal calculi 01/08/2014   frrom noted from [facility] .in chart  . Restless legs 01/08/2014  . Sciatic leg pain    paralysis of sciatic nerve  . Sleep apnea    CPAP/BIPAP  . Tinnitus     Past Surgical History:   Procedure Laterality Date  . ABDOMINAL SURGERY    . BACK SURGERY    . BLADDER REPAIR    . EYE SURGERY    . HERNIA REPAIR    . LEFT HEART CATHETERIZATION WITH CORONARY ANGIOGRAM N/A 01/09/2015   Procedure: LEFT HEART CATHETERIZATION WITH CORONARY ANGIOGRAM;  Surgeon: Clent Demark, MD;  Location: [facility] CATH LAB;  Service: Cardiovascular;  Laterality: N/A;  . NEPHROURETERECTOMY  03/18/2016   with bladder cuff excision- from noted in chart from [facility]  . TOTAL HIP ARTHROPLASTY Left 02/10/2017   Procedure: LEFT TOTAL HIP ARTHROPLASTY ANTERIOR APPROACH;  Surgeon: Mcarthur Rossetti, MD;  Location: [facility] ORS;  Service: Orthopedics;  Laterality: Left;    There were no vitals filed for this visit.  Subjective Assessment - 12/28/17 1345    Subjective  Pt states that he's feeling good.     Pertinent History  current bladder cancer and undergoing treatments, on O2 ("6L when walking long distances"), COPD, DM, neuropathy, HTN, pulmonary fibrosis, arthritis    Currently in Pain?  No/denies            [facility] PT Assessment - 12/28/17 0001      Strength   Right Hip ABduction  5/5 in sitting; was 4+ in sidelying    Left  Hip Flexion  5/5 was 4+    Left Hip ABduction  4+/5 in sitting; was 3+ in sidelying    Left Ankle Dorsiflexion  4+/5 was 4      Ambulation/Gait   Ambulation Distance (Feet)  280 Feet 3MWT, was 226;     Assistive device  Straight cane;Other (Comment) 6L O2    Gait Comments  1 seated rest break after 224f and used rest of time to catch breath      Dynamic Gait Index   Level Surface  Mild Impairment    Change in Gait Speed  Mild Impairment    Gait with Horizontal Head Turns  Mild Impairment    Gait with Vertical Head Turns  Mild Impairment    Gait and Pivot Turn  Mild Impairment    Step Over Obstacle  Mild Impairment    Step Around Obstacles  Normal    Steps  Mild Impairment    Total Score  17    DGI comment:  was 15           PT Education - 12/28/17 1424     Education provided  Yes    Education Details  discharge plans, continue walking and working on balance at home    Person(s) Educated  Patient    Methods  Explanation    Comprehension  Verbalized understanding       PT Short Term Goals - 12/28/17 1348      PT SHORT TERM GOAL #1   Title  Pt will be independent with HEP and perform consistently in order to maximize return to PLOF.    Time  3    Period  Weeks    Status  Achieved      PT SHORT TERM GOAL #2   Title  Pt will have 1/2 grade improvement in MMT of all muscle groups tested in order to maximize gait and balance.    Baseline  see MMT    Time  3    Period  Weeks    Status  Achieved      PT SHORT TERM GOAL #3   Title  Pt will be able to perform 5xSTS in 10 sec or < with no UE to demo improved functional BLE strength and decrease risk for falls.    Baseline  1/8: 6.5 sec    Time  3    Period  Weeks    Status  Achieved      PT SHORT TERM GOAL #4   Title  Pt will be active in regular walking program 3x/week or > to maximize strength and overall QOL.    Baseline  1/8: participating in regular walking program 3x/week, 2x/day    Time  3    Period  Weeks    Status  Achieved        PT Long Term Goals - 12/28/17 1348      PT LONG TERM GOAL #1   Title  Pt will have a 1 grade improvement in MMT of all muscle groups tested in order to decrease pain, maximize gait, and decrease risk for falls.     Time  6    Period  Weeks    Status  Achieved      PT LONG TERM GOAL #2   Title  Pt will be able to perform bil SLS for at least 6 sec or > with no UE to demo improved balance and functional strength of BLE in order to decrease  his risk for falls and allow him to ambulate safely with LRAD.    Baseline  1/31: 15 sec BLE with 1 UE assist; is practicing this at home    Time  6    Period  Weeks    Status  On-going      PT LONG TERM GOAL #3   Title  Pt will score at least 20/24 on the DGI to demo improved overall balance and decrease  his risk for falling.    Baseline  1/31: 17/24    Time  6    Period  Weeks    Status  On-going      PT LONG TERM GOAL #4   Title  Pt will be able to perform TUG with LRAD in 12 sec or < to demo improved functional mobility and overall balance.    Baseline  12/18: 11. 7 sec on 2nd visit with SPC    Time  6    Period  Weeks    Status  Achieved      PT LONG TERM GOAL #5   Title  Pt will have improved 3MWT by 111f or > with LRAD to demo improved functional strength, endurance, and balance in order to maximize community access and participation.    Baseline  1/31: improved by 560fto a total of 28081fith SPC    Time  6    Period  Weeks    Status  Partially Met            Plan - 12/28/17 1422    Clinical Impression Statement  PT reassessed pt early as he has consistently presented to therapy without c/o hip pain for the last few weeks. Pt has made great progress overall towards goals as illustrated above. Pt reports that his only main concern keeping him from where he wants to be is his balance. Pt feels comfortable and confident on continuing to work on this at home. At this time, pt is ready for discharge. Encouraged pt to continue walking and performing HEP at home and educated that he could return with referral if he noticed a significant decline in function.    Rehab Potential  Fair    PT Frequency  2x / week    PT Duration  4 weeks    PT Treatment/Interventions  ADLs/Self Care Home Management;Cryotherapy;Electrical Stimulation;Moist Heat;DME Instruction;Gait training;Stair training;Functional mobility training;Therapeutic activities;Therapeutic exercise;Balance training;Neuromuscular re-education;Patient/family education;Manual techniques;Scar mobilization;Passive range of motion;Dry needling;Energy conservation;Taping    PT Next Visit Plan  discharged    PT Home Exercise Plan  no changes this session    Consulted and Agree with Plan of Care  Patient       Patient will  benefit from skilled therapeutic intervention in order to improve the following deficits and impairments:  Abnormal gait, Cardiopulmonary status limiting activity, Decreased activity tolerance, Decreased balance, Decreased endurance, Decreased mobility, Decreased range of motion, Decreased strength, Difficulty walking, Hypomobility, Increased fascial restricitons, Increased muscle spasms, Impaired flexibility, Impaired sensation, Improper body mechanics, Obesity, Pain  Visit Diagnosis: Pain in left hip  Pain in right hip  Muscle weakness (generalized)  Unsteadiness on feet     Problem List Patient Active Problem List   Diagnosis Date Noted  . Unilateral primary osteoarthritis, left hip 02/10/2017  . 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  . H/O total hip arthroplasty, left 02/10/2017  . History of total hip arthroplasty 02/10/2017  . [place]piratory [place] ([place] OSA (obstructive sleep apnea)   .  Pain in the chest   . Chronic systolic heart failure ([place])   . Esophageal reflux   . Chest pain 01/08/2015  . Insulin dependent diabetes mellitus ([place]) 01/08/2015  . COPD (chronic obstructive pulmonary disease) ([place]) 01/08/2015  . Hyperlipidemia associated with type 2 diabetes mellitus ([place]) 01/08/2015     PHYSICAL THERAPY DISCHARGE SUMMARY  Visits from Start of Care: 16  Current functional level related to goals / functional outcomes: See above   Remaining deficits: See above   Education / Equipment: HEP Plan: Patient agrees to discharge.  Patient goals were partially met. Patient is being discharged due to meeting the stated rehab goals.  ?????      Geraldine Solar PT, [place] [address] [place], [place], [address] Phone: [phone]   Fax:  [phone]  Name: Ricky Johnston MRN: [id number] Date of Birth: [birth year]

## 2018-01-02 ENCOUNTER — Ambulatory Visit (HOSPITAL_COMMUNITY): Payer: Medicare Other

## 2018-01-18 ENCOUNTER — Encounter (INDEPENDENT_AMBULATORY_CARE_PROVIDER_SITE_OTHER): Payer: Self-pay | Admitting: Orthopaedic Surgery

## 2018-01-18 ENCOUNTER — Ambulatory Visit (INDEPENDENT_AMBULATORY_CARE_PROVIDER_SITE_OTHER): Payer: Medicare Other | Admitting: Orthopaedic Surgery

## 2018-01-18 DIAGNOSIS — Z96642 Presence of left artificial hip joint: Secondary | ICD-10-CM | POA: Diagnosis not present

## 2018-01-18 NOTE — Progress Notes (Signed)
The patient is now 11 months status post a left total hip arthroplasty.  He is a significantly obese individual has had problems with balance and coordination but does use a cane.  He says he is satisfied though in terms of how his left hip is doing and it feels much better than he did before surgery.  We did not x-ray him today because we just x-rayed him a few months ago.  On exam his ligaments appear about equal.  He easily puts his left hip through internal and external rotation and I can as well with no pain at all.  I talked to him about what things he needs to look at in terms of pain that would bring him back for this left hip.  From a can see him at any time for this or other orthopedic issues if he has any.  All questions concerns were answered and addressed.  These are had outpatient therapy and says he does not any more to help with his balance but does use his cane.  Follow-up as needed.

## 2018-08-07 ENCOUNTER — Encounter: Payer: No Typology Code available for payment source | Attending: *Deleted

## 2018-08-07 ENCOUNTER — Other Ambulatory Visit: Payer: Self-pay

## 2018-08-07 VITALS — Ht 67.6 in | Wt 306.9 lb

## 2018-08-07 DIAGNOSIS — J8410 Pulmonary fibrosis, unspecified: Secondary | ICD-10-CM | POA: Diagnosis not present

## 2018-08-07 DIAGNOSIS — I10 Essential (primary) hypertension: Secondary | ICD-10-CM | POA: Diagnosis not present

## 2018-08-07 DIAGNOSIS — M1990 Unspecified osteoarthritis, unspecified site: Secondary | ICD-10-CM | POA: Diagnosis not present

## 2018-08-07 DIAGNOSIS — Z7951 Long term (current) use of inhaled steroids: Secondary | ICD-10-CM | POA: Insufficient documentation

## 2018-08-07 DIAGNOSIS — Z8551 Personal history of malignant neoplasm of bladder: Secondary | ICD-10-CM | POA: Diagnosis not present

## 2018-08-07 DIAGNOSIS — Z87891 Personal history of nicotine dependence: Secondary | ICD-10-CM | POA: Diagnosis not present

## 2018-08-07 DIAGNOSIS — Z794 Long term (current) use of insulin: Secondary | ICD-10-CM | POA: Diagnosis not present

## 2018-08-07 DIAGNOSIS — E7800 Pure hypercholesterolemia, unspecified: Secondary | ICD-10-CM | POA: Insufficient documentation

## 2018-08-07 DIAGNOSIS — H9190 Unspecified hearing loss, unspecified ear: Secondary | ICD-10-CM | POA: Insufficient documentation

## 2018-08-07 DIAGNOSIS — Z7982 Long term (current) use of aspirin: Secondary | ICD-10-CM | POA: Diagnosis not present

## 2018-08-07 DIAGNOSIS — Z79899 Other long term (current) drug therapy: Secondary | ICD-10-CM | POA: Diagnosis not present

## 2018-08-07 DIAGNOSIS — Z87442 Personal history of urinary calculi: Secondary | ICD-10-CM | POA: Insufficient documentation

## 2018-08-07 DIAGNOSIS — G4730 Sleep apnea, unspecified: Secondary | ICD-10-CM | POA: Insufficient documentation

## 2018-08-07 DIAGNOSIS — K219 Gastro-esophageal reflux disease without esophagitis: Secondary | ICD-10-CM | POA: Insufficient documentation

## 2018-08-07 DIAGNOSIS — J449 Chronic obstructive pulmonary disease, unspecified: Secondary | ICD-10-CM | POA: Insufficient documentation

## 2018-08-07 DIAGNOSIS — Z86718 Personal history of other venous thrombosis and embolism: Secondary | ICD-10-CM | POA: Diagnosis not present

## 2018-08-07 DIAGNOSIS — E119 Type 2 diabetes mellitus without complications: Secondary | ICD-10-CM | POA: Diagnosis not present

## 2018-08-07 DIAGNOSIS — G2581 Restless legs syndrome: Secondary | ICD-10-CM | POA: Diagnosis not present

## 2018-08-07 NOTE — Patient Instructions (Signed)
Patient Instructions  Patient Details  Name: Ricky Johnston MRN: [id number] Date of Birth: 21-Aug-1951 Referring Provider:  Suzie Portela, MD  Below are your personal goals for exercise, nutrition, and risk factors. Our goal is to help you stay on track towards obtaining and maintaining these goals. We will be discussing your progress on these goals with you throughout the program.  Initial Exercise Prescription: Initial Exercise Prescription - 08/07/18 1600      Date of Initial Exercise RX and Referring Provider   Date  08/07/18    Referring Provider  Suzie Portela MD   [facility]     Oxygen   Oxygen  Continuous    Liters  8-10      Treadmill   MPH  1    Grade  0    Minutes  15    METs  1.77      NuStep   Level  1    SPM  80    Minutes  15    METs  1.7      Biostep-RELP   Level  1    SPM  60    Minutes  15    METs  2      Prescription Details   Frequency (times per week)  3    Duration  Progress to 45 minutes of aerobic exercise without signs/symptoms of physical distress      Intensity   THRR 40-80% of Max Heartrate  109-138    Ratings of Perceived Exertion  11-13    Perceived Dyspnea  0-4      Progression   Progression  Continue to progress workloads to maintain intensity without signs/symptoms of physical distress.      Resistance Training   Training Prescription  Yes    Weight  4 lbs    Reps  10-15       Exercise Goals: Frequency: Be able to perform aerobic exercise two to three times per week in program working toward 2-5 days per week of home exercise.  Intensity: Work with a perceived exertion of 11 (fairly light) - 15 (hard) while following your exercise prescription.  We will make changes to your prescription with you as you progress through the program.   Duration: Be able to do 30 to 45 minutes of continuous aerobic exercise in addition to a 5 minute warm-up and a 5 minute cool-down routine.   Nutrition Goals: Your personal nutrition goals  will be established when you do your nutrition analysis with the dietician.  The following are general nutrition guidelines to follow: Cholesterol < 200mg /day Sodium < 1500mg /day Fiber: Men over 50 yrs - 30 grams per day  Personal Goals: Personal Goals and Risk Factors at Admission - 08/07/18 1526      Core Components/Risk Factors/Patient Goals on Admission    Weight Management  Yes;Weight Loss    Intervention  Weight Management: Develop a combined nutrition and exercise program designed to reach desired caloric intake, while maintaining appropriate intake of nutrient and fiber, sodium and fats, and appropriate energy expenditure required for the weight goal.;Weight Management: Provide education and appropriate resources to help participant work on and attain dietary goals.;Weight Management/Obesity: Establish reasonable short term and long term weight goals.;Obesity: Provide education and appropriate resources to help participant work on and attain dietary goals.    Admit Weight  306 lb 14.4 oz (139.2 kg)    Goal Weight: Short Term  301 lb (136.5 kg)    Goal Weight: Long Term  250 lb (113.4 kg)    Expected Outcomes  Short Term: Continue to assess and modify interventions until short term weight is achieved;Long Term: Adherence to nutrition and physical activity/exercise program aimed toward attainment of established weight goal;Understanding recommendations for meals to include 15-35% energy as protein, 25-35% energy from fat, 35-60% energy from carbohydrates, less than 200mg  of dietary cholesterol, 20-35 gm of total fiber daily;Understanding of distribution of calorie intake throughout the day with the consumption of 4-5 meals/snacks    Improve shortness of breath with ADL's  Yes    Intervention  Provide education, individualized exercise plan and daily activity instruction to help decrease symptoms of SOB with activities of daily living.    Expected Outcomes  Long Term: Be able to perform more  ADLs without symptoms or delay the onset of symptoms;Short Term: Improve cardiorespiratory fitness to achieve a reduction of symptoms when performing ADLs    Diabetes  Yes    Intervention  Provide education about signs/symptoms and action to take for hypo/hyperglycemia.;Provide education about proper nutrition, including hydration, and aerobic/resistive exercise prescription along with prescribed medications to achieve blood glucose in normal ranges: Fasting glucose 65-99 mg/dL    Expected Outcomes  Short Term: Participant verbalizes understanding of the signs/symptoms and immediate care of hyper/hypoglycemia, proper foot care and importance of medication, aerobic/resistive exercise and nutrition plan for blood glucose control.;Long Term: Attainment of HbA1C < 7%.    Hypertension  Yes    Intervention  Provide education on lifestyle modifcations including regular physical activity/exercise, weight management, moderate sodium restriction and increased consumption of fresh fruit, vegetables, and low fat dairy, alcohol moderation, and smoking cessation.;Monitor prescription use compliance.    Expected Outcomes  Short Term: Continued assessment and intervention until BP is < 140/77mm HG in hypertensive participants. < 130/31mm HG in hypertensive participants with diabetes, heart failure or chronic kidney disease.;Long Term: Maintenance of blood pressure at goal levels.    Lipids  Yes    Intervention  Provide education and support for participant on nutrition & aerobic/resistive exercise along with prescribed medications to achieve LDL 70mg , HDL >40mg .    Expected Outcomes  Short Term: Participant states understanding of desired cholesterol values and is compliant with medications prescribed. Participant is following exercise prescription and nutrition guidelines.;Long Term: Cholesterol controlled with medications as prescribed, with individualized exercise RX and with personalized nutrition plan. Value goals:  LDL < 70mg , HDL > 40 mg.       Tobacco Use Initial Evaluation: Social History   Tobacco Use  Smoking Status Former Smoker  . Packs/day: 1.00  . Years: 33.00  . Pack years: 33.00  . Types: Cigarettes  . Last attempt to quit: 04/06/2000  . Years since quitting: 18.3  Smokeless Tobacco Never Used  Tobacco Comment   use to chew cigars    Exercise Goals and Review: Exercise Goals    Row Name 08/07/18 1624             Exercise Goals   Increase Physical Activity  Yes       Intervention  Provide advice, education, support and counseling about physical activity/exercise needs.;Develop an individualized exercise prescription for aerobic and resistive training based on initial evaluation findings, risk stratification, comorbidities and participant's personal goals.       Expected Outcomes  Short Term: Attend rehab on a regular basis to increase amount of physical activity.;Long Term: Add in home exercise to make exercise part of routine and to increase amount of physical activity.;Long Term: Exercising regularly  at least 3-5 days a week.       Increase Strength and Stamina  Yes       Intervention  Provide advice, education, support and counseling about physical activity/exercise needs.;Develop an individualized exercise prescription for aerobic and resistive training based on initial evaluation findings, risk stratification, comorbidities and participant's personal goals.       Expected Outcomes  Short Term: Increase workloads from initial exercise prescription for resistance, speed, and METs.;Short Term: Perform resistance training exercises routinely during rehab and add in resistance training at home;Long Term: Improve cardiorespiratory fitness, muscular endurance and strength as measured by increased METs and functional capacity (6MWT)       Able to understand and use rate of perceived exertion (RPE) scale  Yes       Intervention  Provide education and explanation on how to use RPE scale        Expected Outcomes  Short Term: Able to use RPE daily in rehab to express subjective intensity level;Long Term:  Able to use RPE to guide intensity level when exercising independently       Able to understand and use Dyspnea scale  Yes       Intervention  Provide education and explanation on how to use Dyspnea scale       Expected Outcomes  Short Term: Able to use Dyspnea scale daily in rehab to express subjective sense of shortness of breath during exertion;Long Term: Able to use Dyspnea scale to guide intensity level when exercising independently       Knowledge and understanding of Target Heart Rate Range (THRR)  Yes       Intervention  Provide education and explanation of THRR including how the numbers were predicted and where they are located for reference       Expected Outcomes  Short Term: Able to state/look up THRR;Short Term: Able to use daily as guideline for intensity in rehab;Long Term: Able to use THRR to govern intensity when exercising independently       Able to check pulse independently  Yes       Intervention  Provide education and demonstration on how to check pulse in carotid and radial arteries.;Review the importance of being able to check your own pulse for safety during independent exercise       Expected Outcomes  Short Term: Able to explain why pulse checking is important during independent exercise;Long Term: Able to check pulse independently and accurately       Understanding of Exercise Prescription  Yes       Intervention  Provide education, explanation, and written materials on patient's individual exercise prescription       Expected Outcomes  Short Term: Able to explain program exercise prescription;Long Term: Able to explain home exercise prescription to exercise independently          Copy of goals given to participant.

## 2018-08-07 NOTE — Progress Notes (Signed)
Pulmonary Individual Treatment Plan  Patient Details  Name: Americus Perkey MRN: [id number] Date of Birth: 02-Jun-1951 Referring Provider:     Pulmonary Rehab from 08/07/2018 in [facility] Cardiac and Pulmonary Rehab  Referring Provider  Suzie Portela MD [[facility]]      Initial Encounter Date:    Pulmonary Rehab from 08/07/2018 in [facility] Cardiac and Pulmonary Rehab  Date  08/07/18      Visit Diagnosis: Chronic obstructive pulmonary disease, unspecified COPD type ([place])  Patient's Home Medications on Admission:  Current Outpatient Medications:  .  acetaminophen (TYLENOL) 325 MG tablet, Take 3 tablets (975 mg total) by mouth every 8 (eight) hours as needed for mild pain. (Patient taking differently: Take 975 mg by mouth 2 (two) times daily as needed for mild pain. ), Disp: , Rfl:  .  albuterol (PROVENTIL HFA;VENTOLIN HFA) 108 (90 Base) MCG/ACT inhaler, Inhale 2 puffs into the lungs every 6 (six) hours as needed for wheezing or shortness of breath., Disp: , Rfl:  .  ammonium lactate (LAC-HYDRIN) 12 % lotion, Apply 1 application topically daily as needed for dry skin., Disp: , Rfl:  .  aspirin 81 MG chewable tablet, Chew 1 tablet (81 mg total) by mouth 2 (two) times daily after a meal., Disp: 60 tablet, Rfl: 0 .  atorvastatin (LIPITOR) 80 MG tablet, Take 80 mg by mouth at bedtime., Disp: , Rfl:  .  budesonide-formoterol (SYMBICORT) 160-4.5 MCG/ACT inhaler, Inhale 2 puffs into the lungs 2 (two) times daily., Disp: , Rfl:  .  cetirizine (ZYRTEC) 10 MG tablet, Take 10 mg by mouth daily as needed for allergies., Disp: , Rfl:  .  cholecalciferol (VITAMIN D) 1000 UNITS tablet, Take 1,000 Units by mouth daily. , Disp: , Rfl:  .  ferrous sulfate 325 (65 FE) MG tablet, Take 325 mg by mouth daily. , Disp: , Rfl:  .  furosemide (LASIX) 20 MG tablet, Take 40 mg by mouth daily. , Disp: , Rfl:  .  gabapentin (NEURONTIN) 300 MG capsule, Take 600 mg by mouth 2 (two) times daily. , Disp: , Rfl:  .  insulin aspart  (NOVOLOG) 100 UNIT/ML injection, Inject 30-82 Units into the skin 3 (three) times daily before meals. 50 units at breakfast. 30 units at lunch 82 units at bedtime, Disp: , Rfl:  .  insulin glargine (LANTUS) 100 UNIT/ML injection, Inject 50 Units into the skin 2 (two) times daily. , Disp: , Rfl:  .  losartan (COZAAR) 25 MG tablet, Take 25 mg by mouth daily., Disp: , Rfl:  .  metFORMIN (GLUCOPHAGE) 1000 MG tablet, Take 1,000 mg by mouth 2 (two) times daily with a meal., Disp: , Rfl:  .  methocarbamol (ROBAXIN) 500 MG tablet, Take 1 tablet (500 mg total) by mouth every 6 (six) hours as needed for muscle spasms., Disp: 60 tablet, Rfl: 0 .  omeprazole (PRILOSEC) 20 MG capsule, Take 20 mg by mouth 2 (two) times daily before a meal., Disp: , Rfl:  .  oxybutynin (DITROPAN) 5 MG tablet, Take 5 mg by mouth every 6 (six) hours as needed for bladder spasms., Disp: , Rfl:  .  oxyCODONE-acetaminophen (ROXICET) 5-325 MG tablet, Take 1-2 tablets by mouth every 4 (four) hours as needed., Disp: 60 tablet, Rfl: 0 .  OXYGEN, Inhale 1 L into the lungs continuous. At night time may increase to 2 L as needed for shortness of breath, Disp: , Rfl:  .  rOPINIRole (REQUIP) 0.5 MG tablet, Take 0.5 mg by mouth at bedtime.,  Disp: , Rfl:  .  SUPER B COMPLEX/C PO, Take 1 tablet by mouth 2 (two) times daily., Disp: , Rfl:  .  tamsulosin (FLOMAX) 0.4 MG CAPS capsule, Take 0.4 mg by mouth daily., Disp: , Rfl:  .  tiotropium (SPIRIVA HANDIHALER) 18 MCG inhalation capsule, Place 18 mcg into inhaler and inhale daily., Disp: , Rfl:   Past Medical History: Past Medical History:  Diagnosis Date  . Arthritis   . Cancer [facility])    bladder cancer currently 2016  . Complication of anesthesia    hard for him to wake up from Anesthesia from left nephrectomy  . COPD (chronic obstructive pulmonary disease) ([place])   . Diabetes mellitus without complication ([place])   . DVT (deep venous thrombosis) ([place]) 01/08/2014, 2010   upper extremity  . GERD  (gastroesophageal reflux disease)   . Hallux limitus 05/18/2015   from notes from [place]   . Headache    migraines  . History of kidney stones   . Hypercholesterolemia   . Hypertension   . Impaired hearing   . Intervertebral disc syndrome   . Kidney stones   . Pulmonary fibrosis ([place])   . Renal calculi 01/08/2014   frrom [facility] from [facility]in chart  . Restless legs 01/08/2014  . Sciatic leg pain    paralysis of sciatic nerve  . Sleep apnea    CPAP/BIPAP  . Tinnitus     Tobacco Use: Social History   Tobacco Use  Smoking Status Former Smoker  . Packs/day: 1.00  . Years: 33.00  . Pack years: 33.00  . Types: Cigarettes  . Last attempt to quit: 04/06/2000  . Years since quitting: 18.3  Smokeless Tobacco Never Used  Tobacco Comment   use to chew cigars    Labs: Recent Review Flowsheet Data    Labs for ITP Cardiac and Pulmonary Rehab Latest Ref Rng & Units 01/08/2015 02/10/2017 02/10/2017 02/11/2017   Hemoglobin A1c 4.8 - 5.6 % 8.1(H) - - -   PHART 7.350 - 7.450 - 7.181(LL) 7.217(L) 7.298(L)   PCO2ART 32.0 - 48.0 mmHg - 82.8(HH) 73.1(HH) 62.5(H)   HCO3 20.0 - 28.0 mmol/L - 29.8(H) 28.6(H) 29.4(H)   ACIDBASEDEF 0.0 - 2.0 mmol/L - 0.7 0.8 -   O2SAT % - 98.5 93.5 96.1       Pulmonary Assessment Scores: Pulmonary Assessment Scores    Row Name 08/07/18 1558         ADL UCSD   ADL Phase  Entry     SOB Score total  75     Rest  3     Walk  5     Stairs  5     Bath  4     Dress  4     Shop  0       CAT Score   CAT Score  34       mMRC Score   mMRC Score  4        Pulmonary Function Assessment: Pulmonary Function Assessment - 08/07/18 1523      Initial Spirometry Results   FVC%  31 %    FEV1%  35 %    FEV1/FVC Ratio  85.68    Comments  good patient effort      Post Bronchodilator Spirometry Results   FVC%  32.24 %    FEV1%  37.8 %    FEV1/FVC Ratio  89.05    Comments  good patient effort  Breath   Bilateral Breath Sounds   Clear;Decreased    Shortness of Breath  Limiting activity;Yes;Fear of Shortness of Breath       Exercise Target Goals: Exercise Program Goal: Individual exercise prescription set using results from initial 6 min walk test and THRR while considering  patient's activity barriers and safety.   Exercise Prescription Goal: Initial exercise prescription builds to 30-45 minutes a day of aerobic activity, 2-3 days per week.  Home exercise guidelines will be given to patient during program as part of exercise prescription that the participant will acknowledge.  Activity Barriers & Risk Stratification: Activity Barriers & Cardiac Risk Stratification - 08/07/18 1621      Activity Barriers & Cardiac Risk Stratification   Activity Barriers  Shortness of Breath;Balance Concerns;History of Falls;Left Hip Replacement;Assistive Device;Muscular Weakness;Deconditioning;Back Problems;Joint Problems   DJD, chronic knee pain      6 Minute Walk: 6 Minute Walk    Row Name 08/07/18 1618         6 Minute Walk   Phase  Initial     Distance  555 feet     Walk Time  4.9 minutes     # of Rest Breaks  1 1:06     MPH  1.29     METS  1.07     RPE  15     Perceived Dyspnea   4     VO2 Peak  3.75     Symptoms  Yes (comment)     Comments  SOB, using rollator for support      Resting HR  79 bpm     Resting BP  146/64     Resting Oxygen Saturation   90 %     Exercise Oxygen Saturation  during 6 min walk  87 %     Max Ex. HR  124 bpm     Max Ex. BP  156/64     2 Minute Post BP  150/56       Interval HR   1 Minute HR  110     2 Minute HR  117     3 Minute HR  121     4 Minute HR  106     5 Minute HR  112     6 Minute HR  124     2 Minute Post HR  100     Interval Heart Rate?  Yes       Interval Oxygen   Interval Oxygen?  Yes     Baseline Oxygen Saturation %  90 %     1 Minute Oxygen Saturation %  89 %     1 Minute Liters of Oxygen  8 L continuous     2 Minute Oxygen Saturation %  91 %     2  Minute Liters of Oxygen  8 L     3 Minute Oxygen Saturation %  89 % rest break 2:51-4:07     3 Minute Liters of Oxygen  8 L     4 Minute Oxygen Saturation %  92 %     4 Minute Liters of Oxygen  8 L     5 Minute Oxygen Saturation %  95 %     5 Minute Liters of Oxygen  8 L     6 Minute Oxygen Saturation %  89 % 87% immediately post     6 Minute Liters of Oxygen  8 L     2  Minute Post Oxygen Saturation %  96 %     2 Minute Post Liters of Oxygen  8 L       Oxygen Initial Assessment: Oxygen Initial Assessment - 08/07/18 1520      Home Oxygen   Home Oxygen Device  Home Concentrator;E-Tanks    Sleep Oxygen Prescription  BiPAP;Continuous    Liters per minute  3    Home Exercise Oxygen Prescription  Continuous    Liters per minute  3    Home at Rest Exercise Oxygen Prescription  Continuous    Liters per minute  3    Compliance with Home Oxygen Use  Yes      Initial 6 min Walk   Oxygen Used  Continuous;E-Tanks    Liters per minute  3      Program Oxygen Prescription   Program Oxygen Prescription  E-Tanks;Continuous    Liters per minute  8      Intervention   Short Term Goals  To learn and exhibit compliance with exercise, home and travel O2 prescription;To learn and understand importance of monitoring SPO2 with pulse oximeter and demonstrate accurate use of the pulse oximeter.;To learn and understand importance of maintaining oxygen saturations>88%;To learn and demonstrate proper pursed lip breathing techniques or other breathing techniques.;To learn and demonstrate proper use of respiratory medications    Long  Term Goals  Exhibits compliance with exercise, home and travel O2 prescription;Verbalizes importance of monitoring SPO2 with pulse oximeter and return demonstration;Maintenance of O2 saturations>88%;Exhibits proper breathing techniques, such as pursed lip breathing or other method taught during program session;Compliance with respiratory medication;Demonstrates proper use of MDI's        Oxygen Re-Evaluation:   Oxygen Discharge (Final Oxygen Re-Evaluation):   Initial Exercise Prescription: Initial Exercise Prescription - 08/07/18 1600      Date of Initial Exercise RX and Referring Provider   Date  08/07/18    Referring Provider  Suzie Portela MD   [facility]     Oxygen   Oxygen  Continuous    Liters  8-10      Treadmill   MPH  1    Grade  0    Minutes  15    METs  1.77      NuStep   Level  1    SPM  80    Minutes  15    METs  1.7      Biostep-RELP   Level  1    SPM  60    Minutes  15    METs  2      Prescription Details   Frequency (times per week)  3    Duration  Progress to 45 minutes of aerobic exercise without signs/symptoms of physical distress      Intensity   THRR 40-80% of Max Heartrate  109-138    Ratings of Perceived Exertion  11-13    Perceived Dyspnea  0-4      Progression   Progression  Continue to progress workloads to maintain intensity without signs/symptoms of physical distress.      Resistance Training   Training Prescription  Yes    Weight  4 lbs    Reps  10-15       Perform Capillary Blood Glucose checks as needed.  Exercise Prescription Changes: Exercise Prescription Changes    Row Name 08/07/18 1600             Response to Exercise   Blood Pressure (Admit)  146/64       Blood Pressure (Exercise)  156/64       Blood Pressure (Exit)  150/56       Heart Rate (Admit)  79 bpm       Heart Rate (Exercise)  124 bpm       Heart Rate (Exit)  94 bpm       Oxygen Saturation (Admit)  90 %       Oxygen Saturation (Exercise)  87 %       Oxygen Saturation (Exit)  97 %       Rating of Perceived Exertion (Exercise)  15       Perceived Dyspnea (Exercise)  4       Symptoms  SOB       Comments  walk test results          Exercise Comments:   Exercise Goals and Review: Exercise Goals    Row Name 08/07/18 1624             Exercise Goals   Increase Physical Activity  Yes       Intervention  Provide  advice, education, support and counseling about physical activity/exercise needs.;Develop an individualized exercise prescription for aerobic and resistive training based on initial evaluation findings, risk stratification, comorbidities and participant's personal goals.       Expected Outcomes  Short Term: Attend rehab on a regular basis to increase amount of physical activity.;Long Term: Add in home exercise to make exercise part of routine and to increase amount of physical activity.;Long Term: Exercising regularly at least 3-5 days a week.       Increase Strength and Stamina  Yes       Intervention  Provide advice, education, support and counseling about physical activity/exercise needs.;Develop an individualized exercise prescription for aerobic and resistive training based on initial evaluation findings, risk stratification, comorbidities and participant's personal goals.       Expected Outcomes  Short Term: Increase workloads from initial exercise prescription for resistance, speed, and METs.;Short Term: Perform resistance training exercises routinely during rehab and add in resistance training at home;Long Term: Improve cardiorespiratory fitness, muscular endurance and strength as measured by increased METs and functional capacity (6MWT)       Able to understand and use rate of perceived exertion (RPE) scale  Yes       Intervention  Provide education and explanation on how to use RPE scale       Expected Outcomes  Short Term: Able to use RPE daily in rehab to express subjective intensity level;Long Term:  Able to use RPE to guide intensity level when exercising independently       Able to understand and use Dyspnea scale  Yes       Intervention  Provide education and explanation on how to use Dyspnea scale       Expected Outcomes  Short Term: Able to use Dyspnea scale daily in rehab to express subjective sense of shortness of breath during exertion;Long Term: Able to use Dyspnea scale to guide  intensity level when exercising independently       Knowledge and understanding of Target Heart Rate Range (THRR)  Yes       Intervention  Provide education and explanation of THRR including how the numbers were predicted and where they are located for reference       Expected Outcomes  Short Term: Able to state/look up THRR;Short Term: Able to use daily as guideline for intensity in  rehab;Long Term: Able to use THRR to govern intensity when exercising independently       Able to check pulse independently  Yes       Intervention  Provide education and demonstration on how to check pulse in carotid and radial arteries.;Review the importance of being able to check your own pulse for safety during independent exercise       Expected Outcomes  Short Term: Able to explain why pulse checking is important during independent exercise;Long Term: Able to check pulse independently and accurately       Understanding of Exercise Prescription  Yes       Intervention  Provide education, explanation, and written materials on patient's individual exercise prescription       Expected Outcomes  Short Term: Able to explain program exercise prescription;Long Term: Able to explain home exercise prescription to exercise independently          Exercise Goals Re-Evaluation :   Discharge Exercise Prescription (Final Exercise Prescription Changes): Exercise Prescription Changes - 08/07/18 1600      Response to Exercise   Blood Pressure (Admit)  146/64    Blood Pressure (Exercise)  156/64    Blood Pressure (Exit)  150/56    Heart Rate (Admit)  79 bpm    Heart Rate (Exercise)  124 bpm    Heart Rate (Exit)  94 bpm    Oxygen Saturation (Admit)  90 %    Oxygen Saturation (Exercise)  87 %    Oxygen Saturation (Exit)  97 %    Rating of Perceived Exertion (Exercise)  15    Perceived Dyspnea (Exercise)  4    Symptoms  SOB    Comments  walk test results       Nutrition:  Target Goals: Understanding of nutrition  guidelines, daily intake of sodium <1579m, cholesterol <2061m calories 30% from fat and 7% or less from saturated fats, daily to have 5 or more servings of fruits and vegetables.  Biometrics: Pre Biometrics - 08/07/18 1625      Pre Biometrics   Height  5' 7.6" (1.717 m)    Weight  (!) 306 lb 14.4 oz (139.2 kg)    Waist Circumference  47.5 inches    Hip Circumference  51 inches    Waist to Hip Ratio  0.93 %    BMI (Calculated)  47.22    Single Leg Stand  0 seconds        Nutrition Therapy Plan and Nutrition Goals: Nutrition Therapy & Goals - 08/07/18 1525      Personal Nutrition Goals   Nutrition Goal  Lose weight    Personal Goal #2  Learn how to eat healthier    Comments  He would like to meet with the dietician. He wants to lose weight and learn to eat healthier.      Intervention Plan   Intervention  Prescribe, educate and counsel regarding individualized specific dietary modifications aiming towards targeted core components such as weight, hypertension, lipid management, diabetes, heart failure and other comorbidities.    Expected Outcomes  Short Term Goal: Understand basic principles of dietary content, such as calories, fat, sodium, cholesterol and nutrients.;Long Term Goal: Adherence to prescribed nutrition plan.       Nutrition Assessments: Nutrition Assessments - 08/07/18 1602      MEDFICTS Scores   Pre Score  46       Nutrition Goals Re-Evaluation:   Nutrition Goals Discharge (Final Nutrition Goals Re-Evaluation):   Psychosocial: Target  Goals: Acknowledge presence or absence of significant depression and/or stress, maximize coping skills, provide positive support system. Participant is able to verbalize types and ability to use techniques and skills needed for reducing stress and depression.   Initial Review & Psychosocial Screening: Initial Psych Review & Screening - 08/07/18 1523      Initial Review   Current issues with  Current Sleep  Concerns;Current Stress Concerns    Source of Stress Concerns  Chronic Illness;Unable to perform yard/household activities    Comments  The patient is not mobile and his mother is 1 and is making life stressful for him.      Family Dynamics   Good Support System?  Yes    Comments  He can look to his wifes family for support.      Barriers   Psychosocial barriers to participate in program  There are no identifiable barriers or psychosocial needs.;The patient should benefit from training in stress management and relaxation.      Screening Interventions   Interventions  Encouraged to exercise;Program counselor consult;Provide feedback about the scores to participant;To provide support and resources with identified psychosocial needs    Expected Outcomes  Short Term goal: Utilizing psychosocial counselor, staff and physician to assist with identification of specific Stressors or current issues interfering with healing process. Setting desired goal for each stressor or current issue identified.;Long Term Goal: Stressors or current issues are controlled or eliminated.;Short Term goal: Identification and review with participant of any Quality of Life or Depression concerns found by scoring the questionnaire.;Long Term goal: The participant improves quality of Life and PHQ9 Scores as seen by post scores and/or verbalization of changes       Quality of Life Scores:  Scores of 19 and below usually indicate a poorer quality of life in these areas.  A difference of  2-3 points is a clinically meaningful difference.  A difference of 2-3 points in the total score of the Quality of Life Index has been associated with significant improvement in overall quality of life, self-image, physical symptoms, and general health in studies assessing change in quality of life.  PHQ-9: Recent Review Flowsheet Data    Depression screen [facility] 2/9 08/07/2018   Decreased Interest 1   Down, Depressed, Hopeless 2   PHQ - 2  Score 3   Altered sleeping 2   Tired, decreased energy 2   Change in appetite 0   Feeling bad or failure about yourself  0   Trouble concentrating 0   Moving slowly or fidgety/restless 0   Suicidal thoughts 0   PHQ-9 Score 7   Difficult doing work/chores Very difficult     Interpretation of Total Score  Total Score Depression Severity:  1-4 = Minimal depression, 5-9 = Mild depression, 10-14 = Moderate depression, 15-19 = Moderately severe depression, 20-27 = Severe depression   Psychosocial Evaluation and Intervention:   Psychosocial Re-Evaluation:   Psychosocial Discharge (Final Psychosocial Re-Evaluation):   Education: Education Goals: Education classes will be provided on a weekly basis, covering required topics. Participant will state understanding/return demonstration of topics presented.  Learning Barriers/Preferences: Learning Barriers/Preferences - 08/07/18 1526      Learning Barriers/Preferences   Learning Barriers  Hearing    Learning Preferences  None       Education Topics:  Initial Evaluation Education: - Verbal, written and demonstration of respiratory meds, oximetry and breathing techniques. Instruction on use of nebulizers and MDIs and importance of monitoring MDI activations.   Pulmonary Rehab  from 08/07/2018 in [place] Cardiac and Pulmonary Rehab  Date  08/07/18  Educator  [facility]  Instruction Review Code  1- Verbalizes Understanding      General Nutrition Guidelines/Fats and Fiber: -Group instruction provided by verbal, written material, models and posters to present the general guidelines for heart healthy nutrition. Gives an explanation and review of dietary fats and fiber.   Controlling Sodium/Reading Food Labels: -Group verbal and written material supporting the discussion of sodium use in heart healthy nutrition. Review and explanation with models, verbal and written materials for utilization of the food label.   Exercise Physiology & General  Exercise Guidelines: - Group verbal and written instruction with models to review the exercise physiology of the cardiovascular system and associated critical values. Provides general exercise guidelines with specific guidelines to those with heart or lung disease.    Aerobic Exercise & Resistance Training: - Gives group verbal and written instruction on the various components of exercise. Focuses on aerobic and resistive training programs and the benefits of this training and how to safely progress through these programs.   Flexibility, Balance, Mind/Body Relaxation: Provides group verbal/written instruction on the benefits of flexibility and balance training, including mind/body exercise modes such as yoga, pilates and tai chi.  Demonstration and skill practice provided.   Stress and Anxiety: - Provides group verbal and written instruction about the health risks of elevated stress and causes of high stress.  Discuss the correlation between heart/lung disease and anxiety and treatment options. Review healthy ways to manage with stress and anxiety.   Depression: - Provides group verbal and written instruction on the correlation between heart/lung disease and depressed mood, treatment options, and the stigmas associated with seeking treatment.   Exercise & Equipment Safety: - Individual verbal instruction and demonstration of equipment use and safety with use of the equipment.   Pulmonary Rehab from 08/07/2018 in [facility] Cardiac and Pulmonary Rehab  Date  08/07/18  Educator  [facility]  Instruction Review Code  1- Verbalizes Understanding      Infection Prevention: - Provides verbal and written material to individual with discussion of infection control including proper hand washing and proper equipment cleaning during exercise session.   Pulmonary Rehab from 08/07/2018 in [facility] Cardiac and Pulmonary Rehab  Date  08/07/18  Educator  [facility]  Instruction Review Code  1- Verbalizes Understanding       Falls Prevention: - Provides verbal and written material to individual with discussion of falls prevention and safety.   Pulmonary Rehab from 08/07/2018 in [facility] Cardiac and Pulmonary Rehab  Date  08/07/18  Educator  [facility]  Instruction Review Code  1- Verbalizes Understanding      Diabetes: - Individual verbal and written instruction to review signs/symptoms of diabetes, desired ranges of glucose level fasting, after meals and with exercise. Advice that pre and post exercise glucose checks will be done for 3 sessions at entry of program.   Chronic Lung Diseases: - Group verbal and written instruction to review updates, respiratory medications, advancements in procedures and treatments. Discuss use of supplemental oxygen including available portable oxygen systems, continuous and intermittent flow rates, concentrators, personal use and safety guidelines. Review proper use of inhaler and spacers. Provide informative websites for self-education.    Energy Conservation: - Provide group verbal and written instruction for methods to conserve energy, plan and organize activities. Instruct on pacing techniques, use of adaptive equipment and posture/positioning to relieve shortness of breath.   Triggers and Exacerbations: - Group verbal and written instruction  to review types of environmental triggers and ways to prevent exacerbations. Discuss weather changes, air quality and the benefits of nasal washing. Review warning signs and symptoms to help prevent infections. Discuss techniques for effective airway clearance, coughing, and vibrations.   AED/CPR: - Group verbal and written instruction with the use of models to demonstrate the basic use of the AED with the basic ABC's of resuscitation.   Anatomy and Physiology of the Lungs: - Group verbal and written instruction with the use of models to provide basic lung anatomy and physiology related to function, structure and complications of lung  disease.   Anatomy & Physiology of the Heart: - Group verbal and written instruction and models provide basic cardiac anatomy and physiology, with the coronary electrical and arterial systems. Review of Valvular disease and Heart Failure   Cardiac Medications: - Group verbal and written instruction to review commonly prescribed medications for heart disease. Reviews the medication, class of the drug, and side effects.   Know Your Numbers and Risk Factors: -Group verbal and written instruction about important numbers in your health.  Discussion of what are risk factors and how they play a role in the disease process.  Review of Cholesterol, Blood Pressure, Diabetes, and BMI and the role they play in your overall health.   Sleep Hygiene: -Provides group verbal and written instruction about how sleep can affect your health.  Define sleep hygiene, discuss sleep cycles and impact of sleep habits. Review good sleep hygiene tips.    Other: -Provides group and verbal instruction on various topics (see comments)    Knowledge Questionnaire Score: Knowledge Questionnaire Score - 08/07/18 1526      Knowledge Questionnaire Score   Pre Score  16/18   reviewed with patient       Core Components/Risk Factors/Patient Goals at Admission: Personal Goals and Risk Factors at Admission - 08/07/18 1526      Core Components/Risk Factors/Patient Goals on Admission    Weight Management  Yes;Weight Loss    Intervention  Weight Management: Develop a combined nutrition and exercise program designed to reach desired caloric intake, while maintaining appropriate intake of nutrient and fiber, sodium and fats, and appropriate energy expenditure required for the weight goal.;Weight Management: Provide education and appropriate resources to help participant work on and attain dietary goals.;Weight Management/Obesity: Establish reasonable short term and long term weight goals.;Obesity: Provide education and  appropriate resources to help participant work on and attain dietary goals.    Admit Weight  306 lb 14.4 oz (139.2 kg)    Goal Weight: Short Term  301 lb (136.5 kg)    Goal Weight: Long Term  250 lb (113.4 kg)    Expected Outcomes  Short Term: Continue to assess and modify interventions until short term weight is achieved;Long Term: Adherence to nutrition and physical activity/exercise program aimed toward attainment of established weight goal;Understanding recommendations for meals to include 15-35% energy as protein, 25-35% energy from fat, 35-60% energy from carbohydrates, less than 242m of dietary cholesterol, 20-35 gm of total fiber daily;Understanding of distribution of calorie intake throughout the day with the consumption of 4-5 meals/snacks    Improve shortness of breath with ADL's  Yes    Intervention  Provide education, individualized exercise plan and daily activity instruction to help decrease symptoms of SOB with activities of daily living.    Expected Outcomes  Long Term: Be able to perform more ADLs without symptoms or delay the onset of symptoms;Short Term: Improve cardiorespiratory fitness to achieve  a reduction of symptoms when performing ADLs    Diabetes  Yes    Intervention  Provide education about signs/symptoms and action to take for hypo/hyperglycemia.;Provide education about proper nutrition, including hydration, and aerobic/resistive exercise prescription along with prescribed medications to achieve blood glucose in normal ranges: Fasting glucose 65-99 mg/dL    Expected Outcomes  Short Term: Participant verbalizes understanding of the signs/symptoms and immediate care of hyper/hypoglycemia, proper foot care and importance of medication, aerobic/resistive exercise and nutrition plan for blood glucose control.;Long Term: Attainment of HbA1C < 7%.    Hypertension  Yes    Intervention  Provide education on lifestyle modifcations including regular physical activity/exercise, weight  management, moderate sodium restriction and increased consumption of fresh fruit, vegetables, and low fat dairy, alcohol moderation, and smoking cessation.;Monitor prescription use compliance.    Expected Outcomes  Short Term: Continued assessment and intervention until BP is < 140/1m HG in hypertensive participants. < 130/863mHG in hypertensive participants with diabetes, heart failure or chronic kidney disease.;Long Term: Maintenance of blood pressure at goal levels.    Lipids  Yes    Intervention  Provide education and support for participant on nutrition & aerobic/resistive exercise along with prescribed medications to achieve LDL <7049mHDL >44m53m  Expected Outcomes  Short Term: Participant states understanding of desired cholesterol values and is compliant with medications prescribed. Participant is following exercise prescription and nutrition guidelines.;Long Term: Cholesterol controlled with medications as prescribed, with individualized exercise RX and with personalized nutrition plan. Value goals: LDL < 70mg32mL > 40 mg.       Core Components/Risk Factors/Patient Goals Review:    Core Components/Risk Factors/Patient Goals at Discharge (Final Review):    ITP Comments: ITP Comments    Row Name 08/07/18 1451           ITP Comments  Medical Evaluation completed. Chart sent for review and changes to Dr. Mark Emily Filbertctor of [facility] can be found [place] [facility] [place]          Comments: Initial ITP

## 2018-08-07 NOTE — Progress Notes (Signed)
Daily Session Note  Patient Details  Name: Ricky Johnston MRN: [id number] Date of Birth: 10/15/1951 Referring Provider:     Pulmonary Rehab from 08/07/2018 in [facility] Cardiac and Pul[facility]  Referring Provider  Suzie Portela MD [[facility]]      Encounter Date: 08/07/2018  Check In: Session Check In - 08/07/18 1450      Check-In   Supervising physician immediately available to respond to emergencies  [facility] immediately available ER MD    Physician(s)  Dr. Corky Downs and Clearnce Hasten    Location  [facility]-Cardiac & Pulmonary Rehab    Staff Present  Alberteen Sam, MA, RCEP, CCRP, Exercise Physiologist;Taiyo Kozma Tessie Fass RCP,RRT,BSRT    Medication changes reported      No    Fall or balance concerns reported     No    Warm-up and Cool-down  Not performed (comment)   medical evaluation   Resistance Training Performed  No    VAD Patient?  No      Pain Assessment   Currently in Pain?  No/denies        Exercise Prescription Changes - 08/07/18 1600      Response to Exercise   Blood Pressure (Admit)  146/64    Blood Pressure (Exercise)  156/64    Blood Pressure (Exit)  150/56    Heart Rate (Admit)  79 bpm    Heart Rate (Exercise)  124 bpm    Heart Rate (Exit)  94 bpm    Oxygen Saturation (Admit)  90 %    Oxygen Saturation (Exercise)  87 %    Oxygen Saturation (Exit)  97 %    Rating of Perceived Exertion (Exercise)  15    Perceived Dyspnea (Exercise)  4    Symptoms  SOB    Comments  walk test results       Social History   Tobacco Use  Smoking Status Former Smoker  . Packs/day: 1.00  . Years: 33.00  . Pack years: 33.00  . Types: Cigarettes  . Last attempt to quit: 04/06/2000  . Years since quitting: 18.3  Smokeless Tobacco Never Used  Tobacco Comment   use to chew cigars    Goals Met:  Exercise tolerated well Queuing for purse lip breathing No report of cardiac concerns or symptoms Strength training completed today  Goals Unmet:  Not Applicable  Comments:  6 Minute Walk     Row Name 08/07/18 1618         6 Minute Walk   Phase  Initial     Distance  555 feet     Walk Time  4.9 minutes     # of Rest Breaks  1 1:06     MPH  1.29     METS  1.07     RPE  15     Perceived Dyspnea   4     VO2 Peak  3.75     Symptoms  Yes (comment)     Comments  SOB, using rollator for support      Resting HR  79 bpm     Resting BP  146/64     Resting Oxygen Saturation   90 %     Exercise Oxygen Saturation  during 6 min walk  87 %     Max Ex. HR  124 bpm     Max Ex. BP  156/64     2 Minute Post BP  150/56       Interval HR   1  Minute HR  110     2 Minute HR  117     3 Minute HR  121     4 Minute HR  106     5 Minute HR  112     6 Minute HR  124     2 Minute Post HR  100     Interval Heart Rate?  Yes       Interval Oxygen   Interval Oxygen?  Yes     Baseline Oxygen Saturation %  90 %     1 Minute Oxygen Saturation %  89 %     1 Minute Liters of Oxygen  8 L continuous     2 Minute Oxygen Saturation %  91 %     2 Minute Liters of Oxygen  8 L     3 Minute Oxygen Saturation %  89 % rest break 2:51-4:07     3 Minute Liters of Oxygen  8 L     4 Minute Oxygen Saturation %  92 %     4 Minute Liters of Oxygen  8 L     5 Minute Oxygen Saturation %  95 %     5 Minute Liters of Oxygen  8 L     6 Minute Oxygen Saturation %  89 % 87% immediately post     6 Minute Liters of Oxygen  8 L     2 Minute Post Oxygen Saturation %  96 %     2 Minute Post Liters of Oxygen  8 L      Service Time [id number]   Dr. Emily Filbert is Medical Director for [facility] and [facility].

## 2018-08-17 DIAGNOSIS — J449 Chronic obstructive pulmonary disease, unspecified: Secondary | ICD-10-CM | POA: Diagnosis not present

## 2018-08-17 LAB — GLUCOSE, CAPILLARY
Glucose-Capillary: 262 mg/dL — ABNORMAL HIGH (ref 70–99)
Glucose-Capillary: 180 mg/dL — ABNORMAL HIGH (ref 70–99)

## 2018-08-17 NOTE — Progress Notes (Signed)
Daily Session Note  Patient Details  Name: Ricky Johnston MRN: [id number] Date of Birth: [birth year] Referring Provider:     Pulmonary Rehab from 08/07/2018 in [facility] Cardiac and Pulmonary Rehab  Referring Provider  Suzie Portela MD [[facility]]      Encounter Date: 08/17/2018  Check In: Session Check In - 08/17/18 1020      Check-In   Supervising physician immediately available to respond to emergencies  [facility] immediately available ER MD    Physician(s)  Dr. Archie Balboa and Quentin Cornwall    Location  [facility]-Cardiac & Pulmonary Rehab    Staff Present  Renita Papa, RN BSN;Joseph Foy [place], [place], ACSM CEP, Exercise Physiologist    Medication changes reported      No    Fall or balance concerns reported     No    Warm-up and Cool-down  Performed as group-led instruction    Resistance Training Performed  Yes    VAD Patient?  No    PAD/SET Patient?  No      Pain Assessment   Currently in Pain?  No/denies          Social History   Tobacco Use  Smoking Status Former Smoker  . Packs/day: 1.00  . Years: 33.00  . Pack years: 33.00  . Types: Cigarettes  . Last attempt to quit: 04/06/2000  . Years since quitting: 18.3  Smokeless Tobacco Never Used  Tobacco Comment   use to chew cigars    Goals Met:  Proper associated with RPD/PD & O2 Sat Independence with exercise equipment Using PLB without cueing & demonstrates good technique Exercise tolerated well No report of cardiac concerns or symptoms Strength training completed today  Goals Unmet:  Not Applicable  Comments: First full day of exercise!  Patient was oriented to gym and equipment including functions, settings, policies, and procedures.  Patient's individual exercise prescription and treatment plan were reviewed.  All starting workloads were established based on the results of the 6 minute walk test done at initial orientation visit.  The plan for exercise progression was also introduced and progression will  be customized based on patient's performance and goals.    Dr. Emily Filbert is Medical Director for [place] and [facility].

## 2018-08-20 DIAGNOSIS — J449 Chronic obstructive pulmonary disease, unspecified: Secondary | ICD-10-CM

## 2018-08-20 NOTE — Progress Notes (Signed)
Pulmonary Individual Treatment Plan  Patient Details  Name: Ricky Johnston MRN: [id number] Date of Birth: 02-Jun-1951 Referring Provider:     Pulmonary Rehab from 08/07/2018 in [facility] Cardiac and Pulmonary Rehab  Referring Provider  Suzie Portela MD [[facility]]      Initial Encounter Date:    Pulmonary Rehab from 08/07/2018 in [facility] Cardiac and Pulmonary Rehab  Date  08/07/18      Visit Diagnosis: Chronic obstructive pulmonary disease, unspecified COPD type ([place])  Patient's Home Medications on Admission:  Current Outpatient Medications:  .  acetaminophen (TYLENOL) 325 MG tablet, Take 3 tablets (975 mg total) by mouth every 8 (eight) hours as needed for mild pain. (Patient taking differently: Take 975 mg by mouth 2 (two) times daily as needed for mild pain. ), Disp: , Rfl:  .  albuterol (PROVENTIL HFA;VENTOLIN HFA) 108 (90 Base) MCG/ACT inhaler, Inhale 2 puffs into the lungs every 6 (six) hours as needed for wheezing or shortness of breath., Disp: , Rfl:  .  ammonium lactate (LAC-HYDRIN) 12 % lotion, Apply 1 application topically daily as needed for dry skin., Disp: , Rfl:  .  aspirin 81 MG chewable tablet, Chew 1 tablet (81 mg total) by mouth 2 (two) times daily after a meal., Disp: 60 tablet, Rfl: 0 .  atorvastatin (LIPITOR) 80 MG tablet, Take 80 mg by mouth at bedtime., Disp: , Rfl:  .  budesonide-formoterol (SYMBICORT) 160-4.5 MCG/ACT inhaler, Inhale 2 puffs into the lungs 2 (two) times daily., Disp: , Rfl:  .  cetirizine (ZYRTEC) 10 MG tablet, Take 10 mg by mouth daily as needed for allergies., Disp: , Rfl:  .  cholecalciferol (VITAMIN D) 1000 UNITS tablet, Take 1,000 Units by mouth daily. , Disp: , Rfl:  .  ferrous sulfate 325 (65 FE) MG tablet, Take 325 mg by mouth daily. , Disp: , Rfl:  .  furosemide (LASIX) 20 MG tablet, Take 40 mg by mouth daily. , Disp: , Rfl:  .  gabapentin (NEURONTIN) 300 MG capsule, Take 600 mg by mouth 2 (two) times daily. , Disp: , Rfl:  .  insulin aspart  (NOVOLOG) 100 UNIT/ML injection, Inject 30-82 Units into the skin 3 (three) times daily before meals. 50 units at breakfast. 30 units at lunch 82 units at bedtime, Disp: , Rfl:  .  insulin glargine (LANTUS) 100 UNIT/ML injection, Inject 50 Units into the skin 2 (two) times daily. , Disp: , Rfl:  .  losartan (COZAAR) 25 MG tablet, Take 25 mg by mouth daily., Disp: , Rfl:  .  metFORMIN (GLUCOPHAGE) 1000 MG tablet, Take 1,000 mg by mouth 2 (two) times daily with a meal., Disp: , Rfl:  .  methocarbamol (ROBAXIN) 500 MG tablet, Take 1 tablet (500 mg total) by mouth every 6 (six) hours as needed for muscle spasms., Disp: 60 tablet, Rfl: 0 .  omeprazole (PRILOSEC) 20 MG capsule, Take 20 mg by mouth 2 (two) times daily before a meal., Disp: , Rfl:  .  oxybutynin (DITROPAN) 5 MG tablet, Take 5 mg by mouth every 6 (six) hours as needed for bladder spasms., Disp: , Rfl:  .  oxyCODONE-acetaminophen (ROXICET) 5-325 MG tablet, Take 1-2 tablets by mouth every 4 (four) hours as needed., Disp: 60 tablet, Rfl: 0 .  OXYGEN, Inhale 1 L into the lungs continuous. At night time may increase to 2 L as needed for shortness of breath, Disp: , Rfl:  .  rOPINIRole (REQUIP) 0.5 MG tablet, Take 0.5 mg by mouth at bedtime.,  Disp: , Rfl:  .  SUPER B COMPLEX/C PO, Take 1 tablet by mouth 2 (two) times daily., Disp: , Rfl:  .  tamsulosin (FLOMAX) 0.4 MG CAPS capsule, Take 0.4 mg by mouth daily., Disp: , Rfl:  .  tiotropium (SPIRIVA HANDIHALER) 18 MCG inhalation capsule, Place 18 mcg into inhaler and inhale daily., Disp: , Rfl:   Past Medical History: Past Medical History:  Diagnosis Date  . Arthritis   . Cancer [facility])    bladder cancer currently 2016  . Complication of anesthesia    hard for him to wake up from Anesthesia from left nephrectomy  . COPD (chronic obstructive pulmonary disease) ([place])   . Diabetes mellitus without complication ([place])   . DVT (deep venous thrombosis) ([place]) 01/08/2014, 2010   upper extremity  . GERD  (gastroesophageal reflux disease)   . Hallux limitus 05/18/2015   from notes from [place]   . Headache    migraines  . History of kidney stones   . Hypercholesterolemia   . Hypertension   . Impaired hearing   . Intervertebral disc syndrome   . Kidney stones   . Pulmonary fibrosis ([place])   . Renal calculi 01/08/2014   frrom noted from [facility] .in chart  . Restless legs 01/08/2014  . Sciatic leg pain    paralysis of sciatic nerve  . Sleep apnea    CPAP/BIPAP  . Tinnitus     Tobacco Use: Social History   Tobacco Use  Smoking Status Former Smoker  . Packs/day: 1.00  . Years: 33.00  . Pack years: 33.00  . Types: Cigarettes  . Last attempt to quit: 04/06/2000  . Years since quitting: 18.3  Smokeless Tobacco Never Used  Tobacco Comment   use to chew cigars    Labs: Recent Review Flowsheet Data    Labs for ITP Cardiac and Pulmonary Rehab Latest Ref Rng & Units 01/08/2015 02/10/2017 02/10/2017 02/11/2017   Hemoglobin A1c 4.8 - 5.6 % 8.1(H) - - -   PHART 7.350 - 7.450 - 7.181(LL) 7.217(L) 7.298(L)   PCO2ART 32.0 - 48.0 mmHg - 82.8(HH) 73.1(HH) 62.5(H)   HCO3 20.0 - 28.0 mmol/L - 29.8(H) 28.6(H) 29.4(H)   ACIDBASEDEF 0.0 - 2.0 mmol/L - 0.7 0.8 -   O2SAT % - 98.5 93.5 96.1       Pulmonary Assessment Scores: Pulmonary Assessment Scores    Row Name 08/07/18 1558         ADL UCSD   ADL Phase  Entry     SOB Score total  75     Rest  3     Walk  5     Stairs  5     Bath  4     Dress  4     Shop  0       CAT Score   CAT Score  34       mMRC Score   mMRC Score  4        Pulmonary Function Assessment: Pulmonary Function Assessment - 08/07/18 1523      Initial Spirometry Results   FVC%  31 %    FEV1%  35 %    FEV1/FVC Ratio  85.68    Comments  good patient effort      Post Bronchodilator Spirometry Results   FVC%  32.24 %    FEV1%  37.8 %    FEV1/FVC Ratio  89.05    Comments  good patient effort  Breath   Bilateral Breath Sounds   Clear;Decreased    Shortness of Breath  Limiting activity;Yes;Fear of Shortness of Breath       Exercise Target Goals: Exercise Program Goal: Individual exercise prescription set using results from initial 6 min walk test and THRR while considering  patient's activity barriers and safety.   Exercise Prescription Goal: Initial exercise prescription builds to 30-45 minutes a day of aerobic activity, 2-3 days per week.  Home exercise guidelines will be given to patient during program as part of exercise prescription that the participant will acknowledge.  Activity Barriers & Risk Stratification: Activity Barriers & Cardiac Risk Stratification - 08/07/18 1621      Activity Barriers & Cardiac Risk Stratification   Activity Barriers  Shortness of Breath;Balance Concerns;History of Falls;Left Hip Replacement;Assistive Device;Muscular Weakness;Deconditioning;Back Problems;Joint Problems   DJD, chronic knee pain      6 Minute Walk: 6 Minute Walk    Row Name 08/07/18 1618         6 Minute Walk   Phase  Initial     Distance  555 feet     Walk Time  4.9 minutes     # of Rest Breaks  1 1:06     MPH  1.29     METS  1.07     RPE  15     Perceived Dyspnea   4     VO2 Peak  3.75     Symptoms  Yes (comment)     Comments  SOB, using rollator for support      Resting HR  79 bpm     Resting BP  146/64     Resting Oxygen Saturation   90 %     Exercise Oxygen Saturation  during 6 min walk  87 %     Max Ex. HR  124 bpm     Max Ex. BP  156/64     2 Minute Post BP  150/56       Interval HR   1 Minute HR  110     2 Minute HR  117     3 Minute HR  121     4 Minute HR  106     5 Minute HR  112     6 Minute HR  124     2 Minute Post HR  100     Interval Heart Rate?  Yes       Interval Oxygen   Interval Oxygen?  Yes     Baseline Oxygen Saturation %  90 %     1 Minute Oxygen Saturation %  89 %     1 Minute Liters of Oxygen  8 L continuous     2 Minute Oxygen Saturation %  91 %     2  Minute Liters of Oxygen  8 L     3 Minute Oxygen Saturation %  89 % rest break 2:51-4:07     3 Minute Liters of Oxygen  8 L     4 Minute Oxygen Saturation %  92 %     4 Minute Liters of Oxygen  8 L     5 Minute Oxygen Saturation %  95 %     5 Minute Liters of Oxygen  8 L     6 Minute Oxygen Saturation %  89 % 87% immediately post     6 Minute Liters of Oxygen  8 L     2  Minute Post Oxygen Saturation %  96 %     2 Minute Post Liters of Oxygen  8 L       Oxygen Initial Assessment: Oxygen Initial Assessment - 08/07/18 1520      Home Oxygen   Home Oxygen Device  Home Concentrator;E-Tanks    Sleep Oxygen Prescription  BiPAP;Continuous    Liters per minute  3    Home Exercise Oxygen Prescription  Continuous    Liters per minute  3    Home at Rest Exercise Oxygen Prescription  Continuous    Liters per minute  3    Compliance with Home Oxygen Use  Yes      Initial 6 min Walk   Oxygen Used  Continuous;E-Tanks    Liters per minute  3      Program Oxygen Prescription   Program Oxygen Prescription  E-Tanks;Continuous    Liters per minute  8      Intervention   Short Term Goals  To learn and exhibit compliance with exercise, home and travel O2 prescription;To learn and understand importance of monitoring SPO2 with pulse oximeter and demonstrate accurate use of the pulse oximeter.;To learn and understand importance of maintaining oxygen saturations>88%;To learn and demonstrate proper pursed lip breathing techniques or other breathing techniques.;To learn and demonstrate proper use of respiratory medications    Long  Term Goals  Exhibits compliance with exercise, home and travel O2 prescription;Verbalizes importance of monitoring SPO2 with pulse oximeter and return demonstration;Maintenance of O2 saturations>88%;Exhibits proper breathing techniques, such as pursed lip breathing or other method taught during program session;Compliance with respiratory medication;Demonstrates proper use of MDI's        Oxygen Re-Evaluation: Oxygen Re-Evaluation    Row Name 08/17/18 1023             Goals/Expected Outcomes   Comments  Reviewed PLB technique with pt.  Talked about how it work and it's important to maintaining his exercise saturations.         Goals/Expected Outcomes  Short: Become more profiecient at using PLB.   Long: Become independent at using PLB.          Oxygen Discharge (Final Oxygen Re-Evaluation): Oxygen Re-Evaluation - 08/17/18 1023      Goals/Expected Outcomes   Comments  Reviewed PLB technique with pt.  Talked about how it work and it's important to maintaining his exercise saturations.      Goals/Expected Outcomes  Short: Become more profiecient at using PLB.   Long: Become independent at using PLB.       Initial Exercise Prescription: Initial Exercise Prescription - 08/07/18 1600      Date of Initial Exercise RX and Referring Provider   Date  08/07/18    Referring Provider  Suzie Portela MD   [facility]     Oxygen   Oxygen  Continuous    Liters  8-10      Treadmill   MPH  1    Grade  0    Minutes  15    METs  1.77      NuStep   Level  1    SPM  80    Minutes  15    METs  1.7      Biostep-RELP   Level  1    SPM  60    Minutes  15    METs  2      Prescription Details   Frequency (times per week)  3  Duration  Progress to 45 minutes of aerobic exercise without signs/symptoms of physical distress      Intensity   THRR 40-80% of Max Heartrate  109-138    Ratings of Perceived Exertion  11-13    Perceived Dyspnea  0-4      Progression   Progression  Continue to progress workloads to maintain intensity without signs/symptoms of physical distress.      Resistance Training   Training Prescription  Yes    Weight  4 lbs    Reps  10-15       Perform Capillary Blood Glucose checks as needed.  Exercise Prescription Changes: Exercise Prescription Changes    Row Name 08/07/18 1600             Response to Exercise   Blood Pressure  (Admit)  146/64       Blood Pressure (Exercise)  156/64       Blood Pressure (Exit)  150/56       Heart Rate (Admit)  79 bpm       Heart Rate (Exercise)  124 bpm       Heart Rate (Exit)  94 bpm       Oxygen Saturation (Admit)  90 %       Oxygen Saturation (Exercise)  87 %       Oxygen Saturation (Exit)  97 %       Rating of Perceived Exertion (Exercise)  15       Perceived Dyspnea (Exercise)  4       Symptoms  SOB       Comments  walk test results          Exercise Comments: Exercise Comments    Row Name 08/17/18 1021           Exercise Comments   First full day of exercise!  Patient was oriented to gym and equipment including functions, settings, policies, and procedures.  Patient's individual exercise prescription and treatment plan were reviewed.  All starting workloads were established based on the results of the 6 minute walk test done at initial orientation visit.  The plan for exercise progression was also introduced and progression will be customized based on patient's performance and goals          Exercise Goals and Review: Exercise Goals    Row Name 08/07/18 1624             Exercise Goals   Increase Physical Activity  Yes       Intervention  Provide advice, education, support and counseling about physical activity/exercise needs.;Develop an individualized exercise prescription for aerobic and resistive training based on initial evaluation findings, risk stratification, comorbidities and participant's personal goals.       Expected Outcomes  Short Term: Attend rehab on a regular basis to increase amount of physical activity.;Long Term: Add in home exercise to make exercise part of routine and to increase amount of physical activity.;Long Term: Exercising regularly at least 3-5 days a week.       Increase Strength and Stamina  Yes       Intervention  Provide advice, education, support and counseling about physical activity/exercise needs.;Develop an individualized  exercise prescription for aerobic and resistive training based on initial evaluation findings, risk stratification, comorbidities and participant's personal goals.       Expected Outcomes  Short Term: Increase workloads from initial exercise prescription for resistance, speed, and METs.;Short Term: Perform resistance training exercises routinely during rehab and  add in resistance training at home;Long Term: Improve cardiorespiratory fitness, muscular endurance and strength as measured by increased METs and functional capacity (6MWT)       Able to understand and use rate of perceived exertion (RPE) scale  Yes       Intervention  Provide education and explanation on how to use RPE scale       Expected Outcomes  Short Term: Able to use RPE daily in rehab to express subjective intensity level;Long Term:  Able to use RPE to guide intensity level when exercising independently       Able to understand and use Dyspnea scale  Yes       Intervention  Provide education and explanation on how to use Dyspnea scale       Expected Outcomes  Short Term: Able to use Dyspnea scale daily in rehab to express subjective sense of shortness of breath during exertion;Long Term: Able to use Dyspnea scale to guide intensity level when exercising independently       Knowledge and understanding of Target Heart Rate Range (THRR)  Yes       Intervention  Provide education and explanation of THRR including how the numbers were predicted and where they are located for reference       Expected Outcomes  Short Term: Able to state/look up THRR;Short Term: Able to use daily as guideline for intensity in rehab;Long Term: Able to use THRR to govern intensity when exercising independently       Able to check pulse independently  Yes       Intervention  Provide education and demonstration on how to check pulse in carotid and radial arteries.;Review the importance of being able to check your own pulse for safety during independent exercise        Expected Outcomes  Short Term: Able to explain why pulse checking is important during independent exercise;Long Term: Able to check pulse independently and accurately       Understanding of Exercise Prescription  Yes       Intervention  Provide education, explanation, and written materials on patient's individual exercise prescription       Expected Outcomes  Short Term: Able to explain program exercise prescription;Long Term: Able to explain home exercise prescription to exercise independently          Exercise Goals Re-Evaluation : Exercise Goals Re-Evaluation    Row Name 08/17/18 1022             Exercise Goal Re-Evaluation   Exercise Goals Review  Increase Physical Activity;Increase Strength and Stamina;Able to understand and use rate of perceived exertion (RPE) scale;Able to understand and use Dyspnea scale;Able to check pulse independently;Understanding of Exercise Prescription       Comments  Reviewed RPE scale, THR and program prescription with pt today.  Pt voiced understanding and was given a copy of goals to take home.        Expected Outcomes  Short: Use RPE daily to regulate intensity. Long: Follow program prescription in THR.          Discharge Exercise Prescription (Final Exercise Prescription Changes): Exercise Prescription Changes - 08/07/18 1600      Response to Exercise   Blood Pressure (Admit)  146/64    Blood Pressure (Exercise)  156/64    Blood Pressure (Exit)  150/56    Heart Rate (Admit)  79 bpm    Heart Rate (Exercise)  124 bpm    Heart Rate (Exit)  94 bpm  Oxygen Saturation (Admit)  90 %    Oxygen Saturation (Exercise)  87 %    Oxygen Saturation (Exit)  97 %    Rating of Perceived Exertion (Exercise)  15    Perceived Dyspnea (Exercise)  4    Symptoms  SOB    Comments  walk test results       Nutrition:  Target Goals: Understanding of nutrition guidelines, daily intake of sodium '1500mg'$ , cholesterol '200mg'$ , calories 30% from fat and 7% or less  from saturated fats, daily to have 5 or more servings of fruits and vegetables.  Biometrics: Pre Biometrics - 08/07/18 1625      Pre Biometrics   Height  5' 7.6" (1.717 m)    Weight  (!) 306 lb 14.4 oz (139.2 kg)    Waist Circumference  47.5 inches    Hip Circumference  51 inches    Waist to Hip Ratio  0.93 %    BMI (Calculated)  47.22    Single Leg Stand  0 seconds        Nutrition Therapy Plan and Nutrition Goals: Nutrition Therapy & Goals - 08/07/18 1525      Personal Nutrition Goals   Nutrition Goal  Lose weight    Personal Goal #2  Learn how to eat healthier    Comments  He would like to meet with the dietician. He wants to lose weight and learn to eat healthier.      Intervention Plan   Intervention  Prescribe, educate and counsel regarding individualized specific dietary modifications aiming towards targeted core components such as weight, hypertension, lipid management, diabetes, heart failure and other comorbidities.    Expected Outcomes  Short Term Goal: Understand basic principles of dietary content, such as calories, fat, sodium, cholesterol and nutrients.;Long Term Goal: Adherence to prescribed nutrition plan.       Nutrition Assessments: Nutrition Assessments - 08/07/18 1602      MEDFICTS Scores   Pre Score  46       Nutrition Goals Re-Evaluation:   Nutrition Goals Discharge (Final Nutrition Goals Re-Evaluation):   Psychosocial: Target Goals: Acknowledge presence or absence of significant depression and/or stress, maximize coping skills, provide positive support system. Participant is able to verbalize types and ability to use techniques and skills needed for reducing stress and depression.   Initial Review & Psychosocial Screening: Initial Psych Review & Screening - 08/07/18 1523      Initial Review   Current issues with  Current Sleep Concerns;Current Stress Concerns    Source of Stress Concerns  Chronic Illness;Unable to perform yard/household  activities    Comments  The patient is not mobile and his mother is 56 and is making life stressful for him.      Family Dynamics   Good Support System?  Yes    Comments  He can look to his wifes family for support.      Barriers   Psychosocial barriers to participate in program  There are no identifiable barriers or psychosocial needs.;The patient should benefit from training in stress management and relaxation.      Screening Interventions   Interventions  Encouraged to exercise;Program counselor consult;Provide feedback about the scores to participant;To provide support and resources with identified psychosocial needs    Expected Outcomes  Short Term goal: Utilizing psychosocial counselor, staff and physician to assist with identification of specific Stressors or current issues interfering with healing process. Setting desired goal for each stressor or current issue identified.;Long Term Goal: Stressors  or current issues are controlled or eliminated.;Short Term goal: Identification and review with participant of any Quality of Life or Depression concerns found by scoring the questionnaire.;Long Term goal: The participant improves quality of Life and PHQ9 Scores as seen by post scores and/or verbalization of changes       Quality of Life Scores:  Scores of 19 and below usually indicate a poorer quality of life in these areas.  A difference of  2-3 points is a clinically meaningful difference.  A difference of 2-3 points in the total score of the Quality of Life Index has been associated with significant improvement in overall quality of life, self-image, physical symptoms, and general health in studies assessing change in quality of life.  PHQ-9: Recent Review Flowsheet Data    Depression screen [facility] 2/9 08/07/2018   Decreased Interest 1   Down, Depressed, Hopeless 2   PHQ - 2 Score 3   Altered sleeping 2   Tired, decreased energy 2   Change in appetite 0   Feeling bad or failure about  yourself  0   Trouble concentrating 0   Moving slowly or fidgety/restless 0   Suicidal thoughts 0   PHQ-9 Score 7   Difficult doing work/chores Very difficult     Interpretation of Total Score  Total Score Depression Severity:  1-4 = Minimal depression, 5-9 = Mild depression, 10-14 = Moderate depression, 15-19 = Moderately severe depression, 20-27 = Severe depression   Psychosocial Evaluation and Intervention:   Psychosocial Re-Evaluation:   Psychosocial Discharge (Final Psychosocial Re-Evaluation):   Education: Education Goals: Education classes will be provided on a weekly basis, covering required topics. Participant will state understanding/return demonstration of topics presented.  Learning Barriers/Preferences: Learning Barriers/Preferences - 08/07/18 1526      Learning Barriers/Preferences   Learning Barriers  Hearing    Learning Preferences  None       Education Topics:  Initial Evaluation Education: - Verbal, written and demonstration of respiratory meds, oximetry and breathing techniques. Instruction on use of nebulizers and MDIs and importance of monitoring MDI activations.   Pulmonary Rehab from 08/07/2018 in [facility] Cardiac and Pulmonary Rehab  Date  08/07/18  Educator  [facility]  Instruction Review Code  1- Verbalizes Understanding      General Nutrition Guidelines/Fats and Fiber: -Group instruction provided by verbal, written material, models and posters to present the general guidelines for heart healthy nutrition. Gives an explanation and review of dietary fats and fiber.   Controlling Sodium/Reading Food Labels: -Group verbal and written material supporting the discussion of sodium use in heart healthy nutrition. Review and explanation with models, verbal and written materials for utilization of the food label.   Exercise Physiology & General Exercise Guidelines: - Group verbal and written instruction with models to review the exercise physiology of the  cardiovascular system and associated critical values. Provides general exercise guidelines with specific guidelines to those with heart or lung disease.    Aerobic Exercise & Resistance Training: - Gives group verbal and written instruction on the various components of exercise. Focuses on aerobic and resistive training programs and the benefits of this training and how to safely progress through these programs.   Flexibility, Balance, Mind/Body Relaxation: Provides group verbal/written instruction on the benefits of flexibility and balance training, including mind/body exercise modes such as yoga, pilates and tai chi.  Demonstration and skill practice provided.   Stress and Anxiety: - Provides group verbal and written instruction about the health risks of elevated stress  and causes of high stress.  Discuss the correlation between heart/lung disease and anxiety and treatment options. Review healthy ways to manage with stress and anxiety.   Depression: - Provides group verbal and written instruction on the correlation between heart/lung disease and depressed mood, treatment options, and the stigmas associated with seeking treatment.   Exercise & Equipment Safety: - Individual verbal instruction and demonstration of equipment use and safety with use of the equipment.   Pulmonary Rehab from 08/07/2018 in [facility] Cardiac and Pulmonary Rehab  Date  08/07/18  Educator  [facility]  Instruction Review Code  1- Verbalizes Understanding      Infection Prevention: - Provides verbal and written material to individual with discussion of infection control including proper hand washing and proper equipment cleaning during exercise session.   Pulmonary Rehab from 08/07/2018 in [facility] Cardiac and Pulmonary Rehab  Date  08/07/18  Educator  [facility]  Instruction Review Code  1- Verbalizes Understanding      Falls Prevention: - Provides verbal and written material to individual with discussion of falls prevention and  safety.   Pulmonary Rehab from 08/07/2018 in [facility] Cardiac and Pulmonary Rehab  Date  08/07/18  Educator  [facility]  Instruction Review Code  1- Verbalizes Understanding      Diabetes: - Individual verbal and written instruction to review signs/symptoms of diabetes, desired ranges of glucose level fasting, after meals and with exercise. Advice that pre and post exercise glucose checks will be done for 3 sessions at entry of program.   Chronic Lung Diseases: - Group verbal and written instruction to review updates, respiratory medications, advancements in procedures and treatments. Discuss use of supplemental oxygen including available portable oxygen systems, continuous and intermittent flow rates, concentrators, personal use and safety guidelines. Review proper use of inhaler and spacers. Provide informative websites for self-education.    Energy Conservation: - Provide group verbal and written instruction for methods to conserve energy, plan and organize activities. Instruct on pacing techniques, use of adaptive equipment and posture/positioning to relieve shortness of breath.   Triggers and Exacerbations: - Group verbal and written instruction to review types of environmental triggers and ways to prevent exacerbations. Discuss weather changes, air quality and the benefits of nasal washing. Review warning signs and symptoms to help prevent infections. Discuss techniques for effective airway clearance, coughing, and vibrations.   AED/CPR: - Group verbal and written instruction with the use of models to demonstrate the basic use of the AED with the basic ABC's of resuscitation.   Anatomy and Physiology of the Lungs: - Group verbal and written instruction with the use of models to provide basic lung anatomy and physiology related to function, structure and complications of lung disease.   Anatomy & Physiology of the Heart: - Group verbal and written instruction and models provide basic cardiac  anatomy and physiology, with the coronary electrical and arterial systems. Review of Valvular disease and Heart Failure   Cardiac Medications: - Group verbal and written instruction to review commonly prescribed medications for heart disease. Reviews the medication, class of the drug, and side effects.   Know Your Numbers and Risk Factors: -Group verbal and written instruction about important numbers in your health.  Discussion of what are risk factors and how they play a role in the disease process.  Review of Cholesterol, Blood Pressure, Diabetes, and BMI and the role they play in your overall health.   Sleep Hygiene: -Provides group verbal and written instruction about how sleep can affect your health.  Define sleep hygiene, discuss sleep cycles and impact of sleep habits. Review good sleep hygiene tips.    Other: -Provides group and verbal instruction on various topics (see comments)    Knowledge Questionnaire Score: Knowledge Questionnaire Score - 08/07/18 1526      Knowledge Questionnaire Score   Pre Score  16/18   reviewed with patient       Core Components/Risk Factors/Patient Goals at Admission: Personal Goals and Risk Factors at Admission - 08/07/18 1526      Core Components/Risk Factors/Patient Goals on Admission    Weight Management  Yes;Weight Loss    Intervention  Weight Management: Develop a combined nutrition and exercise program designed to reach desired caloric intake, while maintaining appropriate intake of nutrient and fiber, sodium and fats, and appropriate energy expenditure required for the weight goal.;Weight Management: Provide education and appropriate resources to help participant work on and attain dietary goals.;Weight Management/Obesity: Establish reasonable short term and long term weight goals.;Obesity: Provide education and appropriate resources to help participant work on and attain dietary goals.    Admit Weight  306 lb 14.4 oz (139.2 kg)    Goal  Weight: Short Term  301 lb (136.5 kg)    Goal Weight: Long Term  250 lb (113.4 kg)    Expected Outcomes  Short Term: Continue to assess and modify interventions until short term weight is achieved;Long Term: Adherence to nutrition and physical activity/exercise program aimed toward attainment of established weight goal;Understanding recommendations for meals to include 15-35% energy as protein, 25-35% energy from fat, 35-60% energy from carbohydrates, less than '200mg'$  of dietary cholesterol, 20-35 gm of total fiber daily;Understanding of distribution of calorie intake throughout the day with the consumption of 4-5 meals/snacks    Improve shortness of breath with ADL's  Yes    Intervention  Provide education, individualized exercise plan and daily activity instruction to help decrease symptoms of SOB with activities of daily living.    Expected Outcomes  Long Term: Be able to perform more ADLs without symptoms or delay the onset of symptoms;Short Term: Improve cardiorespiratory fitness to achieve a reduction of symptoms when performing ADLs    Diabetes  Yes    Intervention  Provide education about signs/symptoms and action to take for hypo/hyperglycemia.;Provide education about proper nutrition, including hydration, and aerobic/resistive exercise prescription along with prescribed medications to achieve blood glucose in normal ranges: Fasting glucose 65-99 mg/dL    Expected Outcomes  Short Term: Participant verbalizes understanding of the signs/symptoms and immediate care of hyper/hypoglycemia, proper foot care and importance of medication, aerobic/resistive exercise and nutrition plan for blood glucose control.;Long Term: Attainment of HbA1C < 7%.    Hypertension  Yes    Intervention  Provide education on lifestyle modifcations including regular physical activity/exercise, weight management, moderate sodium restriction and increased consumption of fresh fruit, vegetables, and low fat dairy, alcohol  moderation, and smoking cessation.;Monitor prescription use compliance.    Expected Outcomes  Short Term: Continued assessment and intervention until BP is < 140/28m HG in hypertensive participants. < 130/843mHG in hypertensive participants with diabetes, heart failure or chronic kidney disease.;Long Term: Maintenance of blood pressure at goal levels.    Lipids  Yes    Intervention  Provide education and support for participant on nutrition & aerobic/resistive exercise along with prescribed medications to achieve LDL '70mg'$ , HDL >'40mg'$ .    Expected Outcomes  Short Term: Participant states understanding of desired cholesterol values and is compliant with medications prescribed. Participant is following exercise prescription  and nutrition guidelines.;Long Term: Cholesterol controlled with medications as prescribed, with individualized exercise RX and with personalized nutrition plan. Value goals: LDL < '70mg'$ , HDL > 40 mg.       Core Components/Risk Factors/Patient Goals Review:    Core Components/Risk Factors/Patient Goals at Discharge (Final Review):    ITP Comments: ITP Comments    Row Name 08/07/18 1451 08/20/18 0821         ITP Comments  Medical Evaluation completed. Chart sent for review and changes to Dr. Emily Filbert Director of [place]. Diagnosis can be found in [facility] media patient is a [place] patient  30 day review completed. ITP sent to Dr. Emily Filbert Director of [place]. Continue with ITP unless changes are made by physician         Comments: 30 day review

## 2018-08-22 DIAGNOSIS — J449 Chronic obstructive pulmonary disease, unspecified: Secondary | ICD-10-CM

## 2018-08-22 LAB — GLUCOSE, CAPILLARY: Glucose-Capillary: 339 mg/dL — ABNORMAL HIGH (ref 70–99)

## 2018-08-22 NOTE — Progress Notes (Signed)
Incomplete Session Note  Patient Details  Name: Ricky Johnston MRN: [id number] Date of Birth: 09/14/1951 Referring Provider:     Pulmonary Rehab from 08/07/2018 in [facility] Cardiac and Pulmonary Rehab  Referring Provider  Suzie Portela MD [[place]]      [name] did not complete his rehab session.  BG was 339.

## 2018-08-24 DIAGNOSIS — J449 Chronic obstructive pulmonary disease, unspecified: Secondary | ICD-10-CM

## 2018-08-24 LAB — GLUCOSE, CAPILLARY
GLUCOSE-CAPILLARY: 119 mg/dL — AB (ref 70–99)
GLUCOSE-CAPILLARY: 91 mg/dL (ref 70–99)

## 2018-08-24 NOTE — Progress Notes (Signed)
Daily Session Note  Patient Details  Name: Ricky Johnston MRN: [id number] Date of Birth: 11-Nov-1951 Referring Provider:     Pulmonary Rehab from 08/07/2018 in [facility] Cardiac and Pulmonary Rehab  Referring Provider  Suzie Portela MD [[place]]      Encounter Date: 08/24/2018  Check In: Session Check In - 08/24/18 1008      Check-In   Supervising physician immediately available to respond to emergencies  [facility] immediately available ER MD    Physician(s)  Dr. Corky Downs and Clearnce Hasten    Location  [facility]-Cardiac & Pulmonary Rehab    Staff Present  Justin Mend RCP,RRT,BSRT;Amanda Oletta Darter, BA, ACSM CEP, Exercise Physiologist;Meredith Sherryll Burger, RN BSN    Medication changes reported      No    Fall or balance concerns reported     No    Warm-up and Cool-down  Performed as group-led Higher education careers adviser Performed  Yes    VAD Patient?  No    PAD/SET Patient?  No      Pain Assessment   Currently in Pain?  No/denies          Social History   Tobacco Use  Smoking Status Former Smoker  . Packs/day: 1.00  . Years: 33.00  . Pack years: 33.00  . Types: Cigarettes  . Last attempt to quit: 04/06/2000  . Years since quitting: 18.3  Smokeless Tobacco Never Used  Tobacco Comment   use to chew cigars    Goals Met:  Independence with exercise equipment Exercise tolerated well No report of cardiac concerns or symptoms Strength training completed today  Goals Unmet:  Not Applicable  Comments: Pt able to follow exercise prescription today without complaint.  Will continue to monitor for progression.    Dr. Emily Filbert is Medical Director for [place] and [facility].

## 2018-08-29 ENCOUNTER — Encounter: Payer: No Typology Code available for payment source | Attending: *Deleted | Admitting: *Deleted

## 2018-08-29 DIAGNOSIS — G4730 Sleep apnea, unspecified: Secondary | ICD-10-CM | POA: Diagnosis not present

## 2018-08-29 DIAGNOSIS — Z87442 Personal history of urinary calculi: Secondary | ICD-10-CM | POA: Diagnosis not present

## 2018-08-29 DIAGNOSIS — Z7982 Long term (current) use of aspirin: Secondary | ICD-10-CM | POA: Insufficient documentation

## 2018-08-29 DIAGNOSIS — Z794 Long term (current) use of insulin: Secondary | ICD-10-CM | POA: Insufficient documentation

## 2018-08-29 DIAGNOSIS — G2581 Restless legs syndrome: Secondary | ICD-10-CM | POA: Diagnosis not present

## 2018-08-29 DIAGNOSIS — M1990 Unspecified osteoarthritis, unspecified site: Secondary | ICD-10-CM | POA: Diagnosis not present

## 2018-08-29 DIAGNOSIS — E119 Type 2 diabetes mellitus without complications: Secondary | ICD-10-CM | POA: Insufficient documentation

## 2018-08-29 DIAGNOSIS — K219 Gastro-esophageal reflux disease without esophagitis: Secondary | ICD-10-CM | POA: Insufficient documentation

## 2018-08-29 DIAGNOSIS — I10 Essential (primary) hypertension: Secondary | ICD-10-CM | POA: Insufficient documentation

## 2018-08-29 DIAGNOSIS — J8410 Pulmonary fibrosis, unspecified: Secondary | ICD-10-CM | POA: Insufficient documentation

## 2018-08-29 DIAGNOSIS — H9190 Unspecified hearing loss, unspecified ear: Secondary | ICD-10-CM | POA: Insufficient documentation

## 2018-08-29 DIAGNOSIS — Z8551 Personal history of malignant neoplasm of bladder: Secondary | ICD-10-CM | POA: Insufficient documentation

## 2018-08-29 DIAGNOSIS — Z79899 Other long term (current) drug therapy: Secondary | ICD-10-CM | POA: Insufficient documentation

## 2018-08-29 DIAGNOSIS — J449 Chronic obstructive pulmonary disease, unspecified: Secondary | ICD-10-CM | POA: Diagnosis present

## 2018-08-29 DIAGNOSIS — Z86718 Personal history of other venous thrombosis and embolism: Secondary | ICD-10-CM | POA: Diagnosis not present

## 2018-08-29 DIAGNOSIS — Z7951 Long term (current) use of inhaled steroids: Secondary | ICD-10-CM | POA: Diagnosis not present

## 2018-08-29 DIAGNOSIS — E7800 Pure hypercholesterolemia, unspecified: Secondary | ICD-10-CM | POA: Insufficient documentation

## 2018-08-29 DIAGNOSIS — Z87891 Personal history of nicotine dependence: Secondary | ICD-10-CM | POA: Diagnosis not present

## 2018-08-29 NOTE — Progress Notes (Signed)
Daily Session Note  Patient Details  Name: Ricky Johnston MRN: [id number] Date of Birth: 01/09/1951 Referring Provider:     Pulmonary Rehab from 08/07/2018 in [facility] Cardiac and Pulmonary Rehab  Referring Provider  Suzie Portela MD [[place]]      Encounter Date: 08/29/2018  Check In: Session Check In - 08/29/18 1018      Check-In   Supervising physician immediately available to respond to emergencies  [facility] immediately available ER MD    Physician(s)  Drs. Joni Fears and [place]    Location  [facility]-Cardiac & Pulmonary Rehab    Staff Present  Justin Mend [place] Nick, [place], RCEP, CCRP, Exercise Physiologist;Amanda Oletta Darter, [place], ACSM CEP, Exercise Physiologist    Medication changes reported      No    Fall or balance concerns reported     No    Warm-up and Cool-down  Performed as group-led instruction    Resistance Training Performed  Yes    VAD Patient?  No    PAD/SET Patient?  No      Pain Assessment   Currently in Pain?  No/denies          Social History   Tobacco Use  Smoking Status Former Smoker  . Packs/day: 1.00  . Years: 33.00  . Pack years: 33.00  . Types: Cigarettes  . Last attempt to quit: 04/06/2000  . Years since quitting: 18.4  Smokeless Tobacco Never Used  Tobacco Comment   use to chew cigars    Goals Met:  Independence with exercise equipment Exercise tolerated well No report of cardiac concerns or symptoms Strength training completed today  Goals Unmet:  Not Applicable  Comments: Pt able to follow exercise prescription today without complaint.  Will continue to monitor for progression.   Dr. Emily Filbert is Medical Director for [place] and [facility].

## 2018-09-07 ENCOUNTER — Encounter: Payer: No Typology Code available for payment source | Admitting: *Deleted

## 2018-09-07 DIAGNOSIS — J449 Chronic obstructive pulmonary disease, unspecified: Secondary | ICD-10-CM | POA: Diagnosis not present

## 2018-09-07 LAB — GLUCOSE, CAPILLARY: GLUCOSE-CAPILLARY: 181 mg/dL — AB (ref 70–99)

## 2018-09-07 NOTE — Progress Notes (Signed)
Daily Session Note  Patient Details  Name: Ricky Johnston MRN: [id number] Date of Birth: 1950/12/03 Referring Provider:     Pulmonary Rehab from 08/07/2018 in [facility] Cardiac and Pulmonary Rehab  Referring Provider  Suzie Portela MD [[place]]      Encounter Date: 09/07/2018  Check In: Session Check In - 09/07/18 1025      Check-In   Supervising physician immediately available to respond to emergencies  [facility] immediately available ER MD    Physician(s)  Dr. Artis Flock and Clearnce Hasten    Location  [facility]-Cardiac & Pulmonary Rehab    Staff Present  Renita Papa, RN BSN;Joseph Hood RCP,RRT,BSRT;Amanda Oletta Darter, [place], ACSM CEP, Exercise Physiologist    Medication changes reported      No    Fall or balance concerns reported     No    Warm-up and Cool-down  Performed as group-led instruction    Resistance Training Performed  Yes    VAD Patient?  No    PAD/SET Patient?  No      Pain Assessment   Currently in Pain?  No/denies          Social History   Tobacco Use  Smoking Status Former Smoker  . Packs/day: 1.00  . Years: 33.00  . Pack years: 33.00  . Types: Cigarettes  . Last attempt to quit: 04/06/2000  . Years since quitting: 18.4  Smokeless Tobacco Never Used  Tobacco Comment   use to chew cigars    Goals Met:  Proper associated with RPD/PD & O2 Sat Independence with exercise equipment Using PLB without cueing & demonstrates good technique Exercise tolerated well No report of cardiac concerns or symptoms Strength training completed today  Goals Unmet:  Not Applicable  Comments: Pt able to follow exercise prescription today without complaint.  Will continue to monitor for progression.    Dr. Emily Filbert is Medical Director for [place] and [facility].

## 2018-09-10 ENCOUNTER — Ambulatory Visit (INDEPENDENT_AMBULATORY_CARE_PROVIDER_SITE_OTHER): Payer: Medicare Other | Admitting: Orthopaedic Surgery

## 2018-09-10 DIAGNOSIS — J449 Chronic obstructive pulmonary disease, unspecified: Secondary | ICD-10-CM

## 2018-09-10 NOTE — Progress Notes (Signed)
Daily Session Note  Patient Details  Name: Ricky Johnston MRN: [id number] Date of Birth: 06-16-1951 Referring Provider:     Pulmonary Rehab from 08/07/2018 in [facility] Cardiac and Pulmonary Rehab  Referring Provider  Suzie Portela MD [[place]]      Encounter Date: 09/10/2018  Check In: Session Check In - 09/10/18 0947      Check-In   Supervising physician immediately available to respond to emergencies  [facility] immediately available ER MD    Physician(s)  Dr. Kerman Passey and [facility]    Location  [facility]-Cardiac & Pulmonary Rehab    Staff Present  Justin Mend RCP,RRT,BSRT;Kelly Amedeo Plenty, BS, ACSM CEP, Exercise Physiologist;Amanda Oletta Darter, [place], ACSM CEP, Exercise Physiologist    Medication changes reported      No    Fall or balance concerns reported     No    Warm-up and Cool-down  Performed as group-led instruction    Resistance Training Performed  Yes    VAD Patient?  No    PAD/SET Patient?  No      Pain Assessment   Currently in Pain?  No/denies          Social History   Tobacco Use  Smoking Status Former Smoker  . Packs/day: 1.00  . Years: 33.00  . Pack years: 33.00  . Types: Cigarettes  . Last attempt to quit: 04/06/2000  . Years since quitting: 18.4  Smokeless Tobacco Never Used  Tobacco Comment   use to chew cigars    Goals Met:  Independence with exercise equipment Exercise tolerated well No report of cardiac concerns or symptoms Strength training completed today  Goals Unmet:  Not Applicable  Comments: Pt able to follow exercise prescription today without complaint.  Will continue to monitor for progression.    Dr. Emily Filbert is Medical Director for [place] and [facility].

## 2018-09-17 ENCOUNTER — Telehealth: Payer: Self-pay

## 2018-09-17 DIAGNOSIS — J449 Chronic obstructive pulmonary disease, unspecified: Secondary | ICD-10-CM

## 2018-09-17 NOTE — Progress Notes (Signed)
Pulmonary Individual Treatment Plan  Patient Details  Name: Americus Perkey MRN: [id number] Date of Birth: 02-Jun-1951 Referring Provider:     Pulmonary Rehab from 08/07/2018 in [facility] Cardiac and Pulmonary Rehab  Referring Provider  Suzie Portela MD [[facility]]      Initial Encounter Date:    Pulmonary Rehab from 08/07/2018 in [facility] Cardiac and Pulmonary Rehab  Date  08/07/18      Visit Diagnosis: Chronic obstructive pulmonary disease, unspecified COPD type ([place])  Patient's Home Medications on Admission:  Current Outpatient Medications:  .  acetaminophen (TYLENOL) 325 MG tablet, Take 3 tablets (975 mg total) by mouth every 8 (eight) hours as needed for mild pain. (Patient taking differently: Take 975 mg by mouth 2 (two) times daily as needed for mild pain. ), Disp: , Rfl:  .  albuterol (PROVENTIL HFA;VENTOLIN HFA) 108 (90 Base) MCG/ACT inhaler, Inhale 2 puffs into the lungs every 6 (six) hours as needed for wheezing or shortness of breath., Disp: , Rfl:  .  ammonium lactate (LAC-HYDRIN) 12 % lotion, Apply 1 application topically daily as needed for dry skin., Disp: , Rfl:  .  aspirin 81 MG chewable tablet, Chew 1 tablet (81 mg total) by mouth 2 (two) times daily after a meal., Disp: 60 tablet, Rfl: 0 .  atorvastatin (LIPITOR) 80 MG tablet, Take 80 mg by mouth at bedtime., Disp: , Rfl:  .  budesonide-formoterol (SYMBICORT) 160-4.5 MCG/ACT inhaler, Inhale 2 puffs into the lungs 2 (two) times daily., Disp: , Rfl:  .  cetirizine (ZYRTEC) 10 MG tablet, Take 10 mg by mouth daily as needed for allergies., Disp: , Rfl:  .  cholecalciferol (VITAMIN D) 1000 UNITS tablet, Take 1,000 Units by mouth daily. , Disp: , Rfl:  .  ferrous sulfate 325 (65 FE) MG tablet, Take 325 mg by mouth daily. , Disp: , Rfl:  .  furosemide (LASIX) 20 MG tablet, Take 40 mg by mouth daily. , Disp: , Rfl:  .  gabapentin (NEURONTIN) 300 MG capsule, Take 600 mg by mouth 2 (two) times daily. , Disp: , Rfl:  .  insulin aspart  (NOVOLOG) 100 UNIT/ML injection, Inject 30-82 Units into the skin 3 (three) times daily before meals. 50 units at breakfast. 30 units at lunch 82 units at bedtime, Disp: , Rfl:  .  insulin glargine (LANTUS) 100 UNIT/ML injection, Inject 50 Units into the skin 2 (two) times daily. , Disp: , Rfl:  .  losartan (COZAAR) 25 MG tablet, Take 25 mg by mouth daily., Disp: , Rfl:  .  metFORMIN (GLUCOPHAGE) 1000 MG tablet, Take 1,000 mg by mouth 2 (two) times daily with a meal., Disp: , Rfl:  .  methocarbamol (ROBAXIN) 500 MG tablet, Take 1 tablet (500 mg total) by mouth every 6 (six) hours as needed for muscle spasms., Disp: 60 tablet, Rfl: 0 .  omeprazole (PRILOSEC) 20 MG capsule, Take 20 mg by mouth 2 (two) times daily before a meal., Disp: , Rfl:  .  oxybutynin (DITROPAN) 5 MG tablet, Take 5 mg by mouth every 6 (six) hours as needed for bladder spasms., Disp: , Rfl:  .  oxyCODONE-acetaminophen (ROXICET) 5-325 MG tablet, Take 1-2 tablets by mouth every 4 (four) hours as needed., Disp: 60 tablet, Rfl: 0 .  OXYGEN, Inhale 1 L into the lungs continuous. At night time may increase to 2 L as needed for shortness of breath, Disp: , Rfl:  .  rOPINIRole (REQUIP) 0.5 MG tablet, Take 0.5 mg by mouth at bedtime.,  Disp: , Rfl:  .  SUPER B COMPLEX/C PO, Take 1 tablet by mouth 2 (two) times daily., Disp: , Rfl:  .  tamsulosin (FLOMAX) 0.4 MG CAPS capsule, Take 0.4 mg by mouth daily., Disp: , Rfl:  .  tiotropium (SPIRIVA HANDIHALER) 18 MCG inhalation capsule, Place 18 mcg into inhaler and inhale daily., Disp: , Rfl:   Past Medical History: Past Medical History:  Diagnosis Date  . Arthritis   . Cancer [facility])    bladder cancer currently 2016  . Complication of anesthesia    hard for him to wake up from Anesthesia from left nephrectomy  . COPD (chronic obstructive pulmonary disease) ([place])   . Diabetes mellitus without complication ([place])   . DVT (deep venous thrombosis) ([place]) 01/08/2014, 2010   upper extremity  . GERD  (gastroesophageal reflux disease)   . Hallux limitus 05/18/2015   from notes from [place]   . Headache    migraines  . History of kidney stones   . Hypercholesterolemia   . Hypertension   . Impaired hearing   . Intervertebral disc syndrome   . Kidney stones   . Pulmonary fibrosis ([place])   . Renal calculi 01/08/2014   frrom noted from [facility] .in chart  . Restless legs 01/08/2014  . Sciatic leg pain    paralysis of sciatic nerve  . Sleep apnea    CPAP/BIPAP  . Tinnitus     Tobacco Use: Social History   Tobacco Use  Smoking Status Former Smoker  . Packs/day: 1.00  . Years: 33.00  . Pack years: 33.00  . Types: Cigarettes  . Last attempt to quit: 04/06/2000  . Years since quitting: 18.4  Smokeless Tobacco Never Used  Tobacco Comment   use to chew cigars    Labs: Recent Review Flowsheet Data    Labs for ITP Cardiac and Pulmonary Rehab Latest Ref Rng & Units 01/08/2015 02/10/2017 02/10/2017 02/11/2017   Hemoglobin A1c 4.8 - 5.6 % 8.1(H) - - -   PHART 7.350 - 7.450 - 7.181(LL) 7.217(L) 7.298(L)   PCO2ART 32.0 - 48.0 mmHg - 82.8(HH) 73.1(HH) 62.5(H)   HCO3 20.0 - 28.0 mmol/L - 29.8(H) 28.6(H) 29.4(H)   ACIDBASEDEF 0.0 - 2.0 mmol/L - 0.7 0.8 -   O2SAT % - 98.5 93.5 96.1       Pulmonary Assessment Scores: Pulmonary Assessment Scores    Row Name 08/07/18 1558         ADL UCSD   ADL Phase  Entry     SOB Score total  75     Rest  3     Walk  5     Stairs  5     Bath  4     Dress  4     Shop  0       CAT Score   CAT Score  34       mMRC Score   mMRC Score  4        Pulmonary Function Assessment: Pulmonary Function Assessment - 08/07/18 1523      Initial Spirometry Results   FVC%  31 %    FEV1%  35 %    FEV1/FVC Ratio  85.68    Comments  good patient effort      Post Bronchodilator Spirometry Results   FVC%  32.24 %    FEV1%  37.8 %    FEV1/FVC Ratio  89.05    Comments  good patient effort  Breath   Bilateral Breath Sounds   Clear;Decreased    Shortness of Breath  Limiting activity;Yes;Fear of Shortness of Breath       Exercise Target Goals: Exercise Program Goal: Individual exercise prescription set using results from initial 6 min walk test and THRR while considering  patient's activity barriers and safety.   Exercise Prescription Goal: Initial exercise prescription builds to 30-45 minutes a day of aerobic activity, 2-3 days per week.  Home exercise guidelines will be given to patient during program as part of exercise prescription that the participant will acknowledge.  Activity Barriers & Risk Stratification: Activity Barriers & Cardiac Risk Stratification - 08/07/18 1621      Activity Barriers & Cardiac Risk Stratification   Activity Barriers  Shortness of Breath;Balance Concerns;History of Falls;Left Hip Replacement;Assistive Device;Muscular Weakness;Deconditioning;Back Problems;Joint Problems   DJD, chronic knee pain      6 Minute Walk: 6 Minute Walk    Row Name 08/07/18 1618         6 Minute Walk   Phase  Initial     Distance  555 feet     Walk Time  4.9 minutes     # of Rest Breaks  1 1:06     MPH  1.29     METS  1.07     RPE  15     Perceived Dyspnea   4     VO2 Peak  3.75     Symptoms  Yes (comment)     Comments  SOB, using rollator for support      Resting HR  79 bpm     Resting BP  146/64     Resting Oxygen Saturation   90 %     Exercise Oxygen Saturation  during 6 min walk  87 %     Max Ex. HR  124 bpm     Max Ex. BP  156/64     2 Minute Post BP  150/56       Interval HR   1 Minute HR  110     2 Minute HR  117     3 Minute HR  121     4 Minute HR  106     5 Minute HR  112     6 Minute HR  124     2 Minute Post HR  100     Interval Heart Rate?  Yes       Interval Oxygen   Interval Oxygen?  Yes     Baseline Oxygen Saturation %  90 %     1 Minute Oxygen Saturation %  89 %     1 Minute Liters of Oxygen  8 L continuous     2 Minute Oxygen Saturation %  91 %     2  Minute Liters of Oxygen  8 L     3 Minute Oxygen Saturation %  89 % rest break 2:51-4:07     3 Minute Liters of Oxygen  8 L     4 Minute Oxygen Saturation %  92 %     4 Minute Liters of Oxygen  8 L     5 Minute Oxygen Saturation %  95 %     5 Minute Liters of Oxygen  8 L     6 Minute Oxygen Saturation %  89 % 87% immediately post     6 Minute Liters of Oxygen  8 L     2  Minute Post Oxygen Saturation %  96 %     2 Minute Post Liters of Oxygen  8 L       Oxygen Initial Assessment: Oxygen Initial Assessment - 08/07/18 1520      Home Oxygen   Home Oxygen Device  Home Concentrator;E-Tanks    Sleep Oxygen Prescription  BiPAP;Continuous    Liters per minute  3    Home Exercise Oxygen Prescription  Continuous    Liters per minute  3    Home at Rest Exercise Oxygen Prescription  Continuous    Liters per minute  3    Compliance with Home Oxygen Use  Yes      Initial 6 min Walk   Oxygen Used  Continuous;E-Tanks    Liters per minute  3      Program Oxygen Prescription   Program Oxygen Prescription  E-Tanks;Continuous    Liters per minute  8      Intervention   Short Term Goals  To learn and exhibit compliance with exercise, home and travel O2 prescription;To learn and understand importance of monitoring SPO2 with pulse oximeter and demonstrate accurate use of the pulse oximeter.;To learn and understand importance of maintaining oxygen saturations>88%;To learn and demonstrate proper pursed lip breathing techniques or other breathing techniques.;To learn and demonstrate proper use of respiratory medications    Long  Term Goals  Exhibits compliance with exercise, home and travel O2 prescription;Verbalizes importance of monitoring SPO2 with pulse oximeter and return demonstration;Maintenance of O2 saturations>88%;Exhibits proper breathing techniques, such as pursed lip breathing or other method taught during program session;Compliance with respiratory medication;Demonstrates proper use of MDI's        Oxygen Re-Evaluation: Oxygen Re-Evaluation    Row Name 08/17/18 1023 09/10/18 1023           Program Oxygen Prescription   Program Oxygen Prescription  -  E-Tanks;Continuous      Liters per minute  -  4      Comments  -  patient has been doing very well on 4 liters of oxygen while exercising.        Home Oxygen   Home Oxygen Device  -  Home Concentrator;E-Tanks      Sleep Oxygen Prescription  -  BiPAP;Continuous      Liters per minute  -  3      Home Exercise Oxygen Prescription  -  Continuous      Liters per minute  -  3      Home at Rest Exercise Oxygen Prescription  -  Continuous      Liters per minute  -  3      Compliance with Home Oxygen Use  -  Yes        Goals/Expected Outcomes   Short Term Goals  -  To learn and exhibit compliance with exercise, home and travel O2 prescription;To learn and understand importance of monitoring SPO2 with pulse oximeter and demonstrate accurate use of the pulse oximeter.;To learn and understand importance of maintaining oxygen saturations>88%;To learn and demonstrate proper pursed lip breathing techniques or other breathing techniques.;To learn and demonstrate proper use of respiratory medications      Long  Term Goals  -  Exhibits compliance with exercise, home and travel O2 prescription;Verbalizes importance of monitoring SPO2 with pulse oximeter and return demonstration;Maintenance of O2 saturations>88%;Exhibits proper breathing techniques, such as pursed lip breathing or other method taught during program session;Compliance with respiratory medication;Demonstrates proper use of MDI's  Comments  Reviewed PLB technique with pt.  Talked about how it work and it's important to maintaining his exercise saturations.    Patient is doing well with PLB on exertion. Silas is able to walk further distances now since the start of the program. He is doing well on 4 liters of oxygen instead of 8 liters for exercise. His oxygen is is above 88  percent when exercising.      Goals/Expected Outcomes  Short: Become more profiecient at using PLB.   Long: Become independent at using PLB.  Short: check oxygen at home more on exertion. Long: reduce oxygen below 4 liters for exercise.         Oxygen Discharge (Final Oxygen Re-Evaluation): Oxygen Re-Evaluation - 09/10/18 1023      Program Oxygen Prescription   Program Oxygen Prescription  E-Tanks;Continuous    Liters per minute  4    Comments  patient has been doing very well on 4 liters of oxygen while exercising.      Home Oxygen   Home Oxygen Device  Home Concentrator;E-Tanks    Sleep Oxygen Prescription  BiPAP;Continuous    Liters per minute  3    Home Exercise Oxygen Prescription  Continuous    Liters per minute  3    Home at Rest Exercise Oxygen Prescription  Continuous    Liters per minute  3    Compliance with Home Oxygen Use  Yes      Goals/Expected Outcomes   Short Term Goals  To learn and exhibit compliance with exercise, home and travel O2 prescription;To learn and understand importance of monitoring SPO2 with pulse oximeter and demonstrate accurate use of the pulse oximeter.;To learn and understand importance of maintaining oxygen saturations>88%;To learn and demonstrate proper pursed lip breathing techniques or other breathing techniques.;To learn and demonstrate proper use of respiratory medications    Long  Term Goals  Exhibits compliance with exercise, home and travel O2 prescription;Verbalizes importance of monitoring SPO2 with pulse oximeter and return demonstration;Maintenance of O2 saturations>88%;Exhibits proper breathing techniques, such as pursed lip breathing or other method taught during program session;Compliance with respiratory medication;Demonstrates proper use of MDI's    Comments  Patient is doing well with PLB on exertion. Dorrian is able to walk further distances now since the start of the program. He is doing well on 4 liters of oxygen instead of 8 liters  for exercise. His oxygen is is above 88 percent when exercising.    Goals/Expected Outcomes  Short: check oxygen at home more on exertion. Long: reduce oxygen below 4 liters for exercise.       Initial Exercise Prescription: Initial Exercise Prescription - 08/07/18 1600      Date of Initial Exercise RX and Referring Provider   Date  08/07/18    Referring Provider  Suzie Portela MD   [facility]     Oxygen   Oxygen  Continuous    Liters  8-10      Treadmill   MPH  1    Grade  0    Minutes  15    METs  1.77      NuStep   Level  1    SPM  80    Minutes  15    METs  1.7      Biostep-RELP   Level  1    SPM  60    Minutes  15    METs  2      Prescription Details  Frequency (times per week)  3    Duration  Progress to 45 minutes of aerobic exercise without signs/symptoms of physical distress      Intensity   THRR 40-80% of Max Heartrate  109-138    Ratings of Perceived Exertion  11-13    Perceived Dyspnea  0-4      Progression   Progression  Continue to progress workloads to maintain intensity without signs/symptoms of physical distress.      Resistance Training   Training Prescription  Yes    Weight  4 lbs    Reps  10-15       Perform Capillary Blood Glucose checks as needed.  Exercise Prescription Changes: Exercise Prescription Changes    Row Name 08/07/18 1600 08/22/18 1200 09/05/18 1100         Response to Exercise   Blood Pressure (Admit)  146/64  124/64  134/68     Blood Pressure (Exercise)  156/64  142/64  -     Blood Pressure (Exit)  150/56  126/68  128/60     Heart Rate (Admit)  79 bpm  104 bpm  110 bpm     Heart Rate (Exercise)  124 bpm  115 bpm  114 bpm     Heart Rate (Exit)  94 bpm  101 bpm  110 bpm     Oxygen Saturation (Admit)  90 %  94 %  89 %     Oxygen Saturation (Exercise)  87 %  95 %  88 %     Oxygen Saturation (Exit)  97 %  92 %  88 %     Rating of Perceived Exertion (Exercise)  '15  13  13     '$ Perceived Dyspnea (Exercise)  4  0  1      Symptoms  SOB  -  -     Comments  walk test results  -  -     Duration  -  Progress to 45 minutes of aerobic exercise without signs/symptoms of physical distress  Progress to 45 minutes of aerobic exercise without signs/symptoms of physical distress     Intensity  -  THRR unchanged  THRR unchanged       Progression   Progression  -  Continue to progress workloads to maintain intensity without signs/symptoms of physical distress.  Continue to progress workloads to maintain intensity without signs/symptoms of physical distress.     Average METs  -  1.76  1.8       Resistance Training   Training Prescription  -  Yes  Yes     Weight  -  4 lb  4 lb     Reps  -  10-15  10-15       Interval Training   Interval Training  -  -  No       Treadmill   MPH  -  -  1     Grade  -  -  0     Minutes  -  -  15     METs  -  -  1.77       NuStep   Level  -  -  1     SPM  -  -  80     Minutes  -  -  15     METs  -  -  1.8        Exercise Comments: Exercise Comments    Row Name  08/17/18 1021           Exercise Comments   First full day of exercise!  Patient was oriented to gym and equipment including functions, settings, policies, and procedures.  Patient's individual exercise prescription and treatment plan were reviewed.  All starting workloads were established based on the results of the 6 minute walk test done at initial orientation visit.  The plan for exercise progression was also introduced and progression will be customized based on patient's performance and goals          Exercise Goals and Review: Exercise Goals    Row Name 08/07/18 1624             Exercise Goals   Increase Physical Activity  Yes       Intervention  Provide advice, education, support and counseling about physical activity/exercise needs.;Develop an individualized exercise prescription for aerobic and resistive training based on initial evaluation findings, risk stratification, comorbidities and participant's  personal goals.       Expected Outcomes  Short Term: Attend rehab on a regular basis to increase amount of physical activity.;Long Term: Add in home exercise to make exercise part of routine and to increase amount of physical activity.;Long Term: Exercising regularly at least 3-5 days a week.       Increase Strength and Stamina  Yes       Intervention  Provide advice, education, support and counseling about physical activity/exercise needs.;Develop an individualized exercise prescription for aerobic and resistive training based on initial evaluation findings, risk stratification, comorbidities and participant's personal goals.       Expected Outcomes  Short Term: Increase workloads from initial exercise prescription for resistance, speed, and METs.;Short Term: Perform resistance training exercises routinely during rehab and add in resistance training at home;Long Term: Improve cardiorespiratory fitness, muscular endurance and strength as measured by increased METs and functional capacity (6MWT)       Able to understand and use rate of perceived exertion (RPE) scale  Yes       Intervention  Provide education and explanation on how to use RPE scale       Expected Outcomes  Short Term: Able to use RPE daily in rehab to express subjective intensity level;Long Term:  Able to use RPE to guide intensity level when exercising independently       Able to understand and use Dyspnea scale  Yes       Intervention  Provide education and explanation on how to use Dyspnea scale       Expected Outcomes  Short Term: Able to use Dyspnea scale daily in rehab to express subjective sense of shortness of breath during exertion;Long Term: Able to use Dyspnea scale to guide intensity level when exercising independently       Knowledge and understanding of Target Heart Rate Range (THRR)  Yes       Intervention  Provide education and explanation of THRR including how the numbers were predicted and where they are located for  reference       Expected Outcomes  Short Term: Able to state/look up THRR;Short Term: Able to use daily as guideline for intensity in rehab;Long Term: Able to use THRR to govern intensity when exercising independently       Able to check pulse independently  Yes       Intervention  Provide education and demonstration on how to check pulse in carotid and radial arteries.;Review the importance of being able to check your own pulse  for safety during independent exercise       Expected Outcomes  Short Term: Able to explain why pulse checking is important during independent exercise;Long Term: Able to check pulse independently and accurately       Understanding of Exercise Prescription  Yes       Intervention  Provide education, explanation, and written materials on patient's individual exercise prescription       Expected Outcomes  Short Term: Able to explain program exercise prescription;Long Term: Able to explain home exercise prescription to exercise independently          Exercise Goals Re-Evaluation : Exercise Goals Re-Evaluation    [place] Name 08/17/18 1022 09/05/18 1150           Exercise Goal Re-Evaluation   Exercise Goals Review  Increase Physical Activity;Increase Strength and Stamina;Able to understand and use rate of perceived exertion (RPE) scale;Able to understand and use Dyspnea scale;Able to check pulse independently;Understanding of Exercise Prescription  Increase Physical Activity;Increase Strength and Stamina;Able to understand and use rate of perceived exertion (RPE) scale;Able to understand and use Dyspnea scale      Comments  Reviewed RPE scale, THR and program prescription with pt today.  Pt voiced understanding and was given a copy of goals to take home.   Keenen has just started LW.  He needs to attend consistently to see progress.  He tolerates exercise well when he attends.      Expected Outcomes  Short: Use RPE daily to regulate intensity. Long: Follow program prescription in  THR.  Short - attend consistently Long - increase MET level         Discharge Exercise Prescription (Final Exercise Prescription Changes): Exercise Prescription Changes - 09/05/18 1100      Response to Exercise   Blood Pressure (Admit)  134/68    Blood Pressure (Exit)  128/60    Heart Rate (Admit)  110 bpm    Heart Rate (Exercise)  114 bpm    Heart Rate (Exit)  110 bpm    Oxygen Saturation (Admit)  89 %    Oxygen Saturation (Exercise)  88 %    Oxygen Saturation (Exit)  88 %    Rating of Perceived Exertion (Exercise)  13    Perceived Dyspnea (Exercise)  1    Duration  Progress to 45 minutes of aerobic exercise without signs/symptoms of physical distress    Intensity  THRR unchanged      Progression   Progression  Continue to progress workloads to maintain intensity without signs/symptoms of physical distress.    Average METs  1.8      Resistance Training   Training Prescription  Yes    Weight  4 lb    Reps  10-15      Interval Training   Interval Training  No      Treadmill   MPH  1    Grade  0    Minutes  15    METs  1.77      NuStep   Level  1    SPM  80    Minutes  15    METs  1.8       Nutrition:  Target Goals: Understanding of nutrition guidelines, daily intake of sodium '1500mg'$ , cholesterol '200mg'$ , calories 30% from fat and 7% or less from saturated fats, daily to have 5 or more servings of fruits and vegetables.  Biometrics: Pre Biometrics - 08/07/18 1625      Pre Biometrics  Height  5' 7.6" (1.717 m)    Weight  (!) 306 lb 14.4 oz (139.2 kg)    Waist Circumference  47.5 inches    Hip Circumference  51 inches    Waist to Hip Ratio  0.93 %    BMI (Calculated)  47.22    Single Leg Stand  0 seconds        Nutrition Therapy Plan and Nutrition Goals: Nutrition Therapy & Goals - 08/29/18 1203      Nutrition Therapy   Diet  DM    Drug/Food Interactions  Statins/Certain Fruits    Protein (specify units)  12oz    Fiber  35 grams    Whole Grain  Foods  3 servings   chooses whole grain breads   Saturated Fats  15 max. grams    Fruits and Vegetables  6 servings/day   8 ideal, eats fruits and vegetables daily   Sodium  1500 grams      Personal Nutrition Goals   Nutrition Goal  Practice eating on a consistent schedule each day. For times when you are not hungry for a meal, it may be helpful to at least have a snack with protein + a carb serving to best manage BG control. This is especially important in the evenings    Personal Goal #2  Add a protein and/or fiber source to meals that are carb-heavy such as cereal to prevent hyperglycemia    Personal Goal #3  Eat things like chocolate in moderation and choose sugar-free varieties when possible    Comments  He does not follow a diabetic diet but does try to monitor CHO intake and not eat sweets other than chocolate (sometimes SF). he is lactose intolerant and tries not to add salt to foods, though does eat fried foods. Reports BG control over the past month to be fair-poor. Eats whole wheat bread, a variety of fruits and vegetables, and nuts. Chooses SF beverages. Reports to skip meals occasionally d/t lack of hunger      Intervention Plan   Intervention  Prescribe, educate and counsel regarding individualized specific dietary modifications aiming towards targeted core components such as weight, hypertension, lipid management, diabetes, heart failure and other comorbidities.    Expected Outcomes  Short Term Goal: Understand basic principles of dietary content, such as calories, fat, sodium, cholesterol and nutrients.;Short Term Goal: A plan has been developed with personal nutrition goals set during dietitian appointment.;Long Term Goal: Adherence to prescribed nutrition plan.       Nutrition Assessments: Nutrition Assessments - 08/07/18 1602      MEDFICTS Scores   Pre Score  46       Nutrition Goals Re-Evaluation: Nutrition Goals Re-Evaluation    [place] Name 08/29/18 1234              Goals   Nutrition Goal  Practice eating on a consistent schedule each day. For times when you are not hungry for a meal, it may be helpful to at least have a snack with protein + a carb serving to best manage BG control. This is especially important in the evenings       Comment  He skips meals occasionally d/t lack of hunger. Reports to have a small snack like 1/2 pack of PB crackers rather than a meal sometimes but may also eat nothing. Last night he skipped dinner and did not eat again until before class today       Expected Outcome  He will eat  a snack that includes a carb source + a protein source in place of meals if not hungry to avoid going long periods of time without eating to improve BG control. He will do his best to eat on a consistent schedule each day         Personal Goal #2 Re-Evaluation   Personal Goal #2  Add a protein and/or fiber source to meals that are carb-heavy such as cereal to prevent hyperglycemia         Personal Goal #3 Re-Evaluation   Personal Goal #3  Eat things like chocolate in moderation and choose sugar free varieties when possible          Nutrition Goals Discharge (Final Nutrition Goals Re-Evaluation): Nutrition Goals Re-Evaluation - 08/29/18 1234      Goals   Nutrition Goal  Practice eating on a consistent schedule each day. For times when you are not hungry for a meal, it may be helpful to at least have a snack with protein + a carb serving to best manage BG control. This is especially important in the evenings    Comment  He skips meals occasionally d/t lack of hunger. Reports to have a small snack like 1/2 pack of PB crackers rather than a meal sometimes but may also eat nothing. Last night he skipped dinner and did not eat again until before class today    Expected Outcome  He will eat a snack that includes a carb source + a protein source in place of meals if not hungry to avoid going long periods of time without eating to improve BG control. He will  do his best to eat on a consistent schedule each day      Personal Goal #2 Re-Evaluation   Personal Goal #2  Add a protein and/or fiber source to meals that are carb-heavy such as cereal to prevent hyperglycemia      Personal Goal #3 Re-Evaluation   Personal Goal #3  Eat things like chocolate in moderation and choose sugar free varieties when possible       Psychosocial: Target Goals: Acknowledge presence or absence of significant depression and/or stress, maximize coping skills, provide positive support system. Participant is able to verbalize types and ability to use techniques and skills needed for reducing stress and depression.   Initial Review & Psychosocial Screening: Initial Psych Review & Screening - 08/07/18 1523      Initial Review   Current issues with  Current Sleep Concerns;Current Stress Concerns    Source of Stress Concerns  Chronic Illness;Unable to perform yard/household activities    Comments  The patient is not mobile and his mother is 43 and is making life stressful for him.      Family Dynamics   Good Support System?  Yes    Comments  He can look to his wifes family for support.      Barriers   Psychosocial barriers to participate in program  There are no identifiable barriers or psychosocial needs.;The patient should benefit from training in stress management and relaxation.      Screening Interventions   Interventions  Encouraged to exercise;Program counselor consult;Provide feedback about the scores to participant;To provide support and resources with identified psychosocial needs    Expected Outcomes  Short Term goal: Utilizing psychosocial counselor, staff and physician to assist with identification of specific Stressors or current issues interfering with healing process. Setting desired goal for each stressor or current issue identified.;Long Term Goal: Stressors or current  issues are controlled or eliminated.;Short Term goal: Identification and review with  participant of any Quality of Life or Depression concerns found by scoring the questionnaire.;Long Term goal: The participant improves quality of Life and PHQ9 Scores as seen by post scores and/or verbalization of changes       Quality of Life Scores:  Scores of 19 and below usually indicate a poorer quality of life in these areas.  A difference of  2-3 points is a clinically meaningful difference.  A difference of 2-3 points in the total score of the Quality of Life Index has been associated with significant improvement in overall quality of life, self-image, physical symptoms, and general health in studies assessing change in quality of life.  PHQ-9: Recent Review Flowsheet Data    Depression screen [facility] 2/9 08/07/2018   Decreased Interest 1   Down, Depressed, Hopeless 2   PHQ - 2 Score 3   Altered sleeping 2   Tired, decreased energy 2   Change in appetite 0   Feeling bad or failure about yourself  0   Trouble concentrating 0   Moving slowly or fidgety/restless 0   Suicidal thoughts 0   PHQ-9 Score 7   Difficult doing work/chores Very difficult     Interpretation of Total Score  Total Score Depression Severity:  1-4 = Minimal depression, 5-9 = Mild depression, 10-14 = Moderate depression, 15-19 = Moderately severe depression, 20-27 = Severe depression   Psychosocial Evaluation and Intervention: Psychosocial Evaluation - 08/29/18 1131      Psychosocial Evaluation & Interventions   Interventions  Stress management education;Relaxation education;Encouraged to exercise with the program and follow exercise prescription    Comments  Counselor met with Mr. Savo [place]) today for initial psychosocial evaluation.  He is a 67 year old who has COPD; as well as diabetes; sleep apnea and a recent cancer survivor.  Jachai has a strong support system with a spouse of 18 years; (2) step children with positive relationships and (2) half sisters.  Abdon reports sleeping fairly well in a recliner for  approximately 8-9 hours most nights.  He uses a CPAP for this.  He reports his appetite is "too good" and would like to lose some weight.  Charlie denies a history of depression or anxiety or any current symptoms and is typically in a positive mood.  However, Arden reports his 3 year old mother in [place] is dying and this has been stressful and painful for him - impacting his mood and sleep at times.  Counselor processed this with Jaquail and provided support - offering a grief support group if needed.  Jailen has goals to improve his balance and oxygen levels and possibly lose some weight while in this program.     Expected Outcomes  Short:  Shahrukh will exercise for his health and mental health as a positive coping strategy.  He will meet with the dietician for a healthy nutrition plan.  Cornelius will inform this counselor if needs further information/support through this time of grief/loss with his mother's illness.  Long:  Dresean will develop a pattern of healthy lifestyle choices for his health and mental health.      Continue Psychosocial Services   Follow up required by staff       Psychosocial Re-Evaluation:   Psychosocial Discharge (Final Psychosocial Re-Evaluation):   Education: Education Goals: Education classes will be provided on a weekly basis, covering required topics. Participant will state understanding/return demonstration of topics presented.  Learning Barriers/Preferences:  Learning Barriers/Preferences - 08/07/18 1526      Learning Barriers/Preferences   Learning Barriers  Hearing    Learning Preferences  None       Education Topics:  Initial Evaluation Education: - Verbal, written and demonstration of respiratory meds, oximetry and breathing techniques. Instruction on use of nebulizers and MDIs and importance of monitoring MDI activations.   Pulmonary Rehab from 09/07/2018 in [facility] Cardiac and Pulmonary Rehab  Date  08/07/18  Educator  [facility]  Instruction Review Code  1- Verbalizes  Understanding      General Nutrition Guidelines/Fats and Fiber: -Group instruction provided by verbal, written material, models and posters to present the general guidelines for heart healthy nutrition. Gives an explanation and review of dietary fats and fiber.   Controlling Sodium/Reading Food Labels: -Group verbal and written material supporting the discussion of sodium use in heart healthy nutrition. Review and explanation with models, verbal and written materials for utilization of the food label.   Exercise Physiology & General Exercise Guidelines: - Group verbal and written instruction with models to review the exercise physiology of the cardiovascular system and associated critical values. Provides general exercise guidelines with specific guidelines to those with heart or lung disease.    Aerobic Exercise & Resistance Training: - Gives group verbal and written instruction on the various components of exercise. Focuses on aerobic and resistive training programs and the benefits of this training and how to safely progress through these programs.   Flexibility, Balance, Mind/Body Relaxation: Provides group verbal/written instruction on the benefits of flexibility and balance training, including mind/body exercise modes such as yoga, pilates and tai chi.  Demonstration and skill practice provided.   Stress and Anxiety: - Provides group verbal and written instruction about the health risks of elevated stress and causes of high stress.  Discuss the correlation between heart/lung disease and anxiety and treatment options. Review healthy ways to manage with stress and anxiety.   Depression: - Provides group verbal and written instruction on the correlation between heart/lung disease and depressed mood, treatment options, and the stigmas associated with seeking treatment.   Exercise & Equipment Safety: - Individual verbal instruction and demonstration of equipment use and safety with  use of the equipment.   Pulmonary Rehab from 09/07/2018 in [facility] Cardiac and Pulmonary Rehab  Date  08/07/18  Educator  [facility]  Instruction Review Code  1- Verbalizes Understanding      Infection Prevention: - Provides verbal and written material to individual with discussion of infection control including proper hand washing and proper equipment cleaning during exercise session.   Pulmonary Rehab from 09/07/2018 in [facility] Cardiac and Pulmonary Rehab  Date  08/07/18  Educator  [facility]  Instruction Review Code  1- Verbalizes Understanding      Falls Prevention: - Provides verbal and written material to individual with discussion of falls prevention and safety.   Pulmonary Rehab from 09/07/2018 in [facility] Cardiac and Pulmonary Rehab  Date  08/07/18  Educator  [facility]  Instruction Review Code  1- Verbalizes Understanding      Diabetes: - Individual verbal and written instruction to review signs/symptoms of diabetes, desired ranges of glucose level fasting, after meals and with exercise. Advice that pre and post exercise glucose checks will be done for 3 sessions at entry of program.   Chronic Lung Diseases: - Group verbal and written instruction to review updates, respiratory medications, advancements in procedures and treatments. Discuss use of supplemental oxygen including available portable oxygen systems, continuous and intermittent flow  rates, concentrators, personal use and safety guidelines. Review proper use of inhaler and spacers. Provide informative websites for self-education.    Energy Conservation: - Provide group verbal and written instruction for methods to conserve energy, plan and organize activities. Instruct on pacing techniques, use of adaptive equipment and posture/positioning to relieve shortness of breath.   Triggers and Exacerbations: - Group verbal and written instruction to review types of environmental triggers and ways to prevent exacerbations. Discuss weather changes, air  quality and the benefits of nasal washing. Review warning signs and symptoms to help prevent infections. Discuss techniques for effective airway clearance, coughing, and vibrations.   Pulmonary Rehab from 09/07/2018 in [facility] Cardiac and Pulmonary Rehab  Date  08/29/18  Educator  [facility]  Instruction Review Code  1- Verbalizes Understanding      AED/CPR: - Group verbal and written instruction with the use of models to demonstrate the basic use of the AED with the basic ABC's of resuscitation.   Anatomy and Physiology of the Lungs: - Group verbal and written instruction with the use of models to provide basic lung anatomy and physiology related to function, structure and complications of lung disease.   Anatomy & Physiology of the Heart: - Group verbal and written instruction and models provide basic cardiac anatomy and physiology, with the coronary electrical and arterial systems. Review of Valvular disease and Heart Failure   Pulmonary Rehab from 09/07/2018 in [facility] Cardiac and Pulmonary Rehab  Date  09/07/18  Educator  [facility]  Instruction Review Code  1- Verbalizes Understanding      Cardiac Medications: - Group verbal and written instruction to review commonly prescribed medications for heart disease. Reviews the medication, class of the drug, and side effects.   Know Your Numbers and Risk Factors: -Group verbal and written instruction about important numbers in your health.  Discussion of what are risk factors and how they play a role in the disease process.  Review of Cholesterol, Blood Pressure, Diabetes, and BMI and the role they play in your overall health.   Pulmonary Rehab from 09/07/2018 in [facility] Cardiac and Pulmonary Rehab  Date  08/24/18  Educator  [facility]  Instruction Review Code  1- Verbalizes Understanding      Sleep Hygiene: -Provides group verbal and written instruction about how sleep can affect your health.  Define sleep hygiene, discuss sleep cycles and impact of sleep  habits. Review good sleep hygiene tips.    Other: -Provides group and verbal instruction on various topics (see comments)    Knowledge Questionnaire Score: Knowledge Questionnaire Score - 08/07/18 1526      Knowledge Questionnaire Score   Pre Score  16/18   reviewed with patient       Core Components/Risk Factors/Patient Goals at Admission: Personal Goals and Risk Factors at Admission - 08/07/18 1526      Core Components/Risk Factors/Patient Goals on Admission    Weight Management  Yes;Weight Loss    Intervention  Weight Management: Develop a combined nutrition and exercise program designed to reach desired caloric intake, while maintaining appropriate intake of nutrient and fiber, sodium and fats, and appropriate energy expenditure required for the weight goal.;Weight Management: Provide education and appropriate resources to help participant work on and attain dietary goals.;Weight Management/Obesity: Establish reasonable short term and long term weight goals.;Obesity: Provide education and appropriate resources to help participant work on and attain dietary goals.    Admit Weight  306 lb 14.4 oz (139.2 kg)    Goal Weight: Short Term  301 lb (136.5 kg)    Goal Weight: Long Term  250 lb (113.4 kg)    Expected Outcomes  Short Term: Continue to assess and modify interventions until short term weight is achieved;Long Term: Adherence to nutrition and physical activity/exercise program aimed toward attainment of established weight goal;Understanding recommendations for meals to include 15-35% energy as protein, 25-35% energy from fat, 35-60% energy from carbohydrates, less than '200mg'$  of dietary cholesterol, 20-35 gm of total fiber daily;Understanding of distribution of calorie intake throughout the day with the consumption of 4-5 meals/snacks    Improve shortness of breath with ADL's  Yes    Intervention  Provide education, individualized exercise plan and daily activity instruction to help  decrease symptoms of SOB with activities of daily living.    Expected Outcomes  Long Term: Be able to perform more ADLs without symptoms or delay the onset of symptoms;Short Term: Improve cardiorespiratory fitness to achieve a reduction of symptoms when performing ADLs    Diabetes  Yes    Intervention  Provide education about signs/symptoms and action to take for hypo/hyperglycemia.;Provide education about proper nutrition, including hydration, and aerobic/resistive exercise prescription along with prescribed medications to achieve blood glucose in normal ranges: Fasting glucose 65-99 mg/dL    Expected Outcomes  Short Term: Participant verbalizes understanding of the signs/symptoms and immediate care of hyper/hypoglycemia, proper foot care and importance of medication, aerobic/resistive exercise and nutrition plan for blood glucose control.;Long Term: Attainment of HbA1C < 7%.    Hypertension  Yes    Intervention  Provide education on lifestyle modifcations including regular physical activity/exercise, weight management, moderate sodium restriction and increased consumption of fresh fruit, vegetables, and low fat dairy, alcohol moderation, and smoking cessation.;Monitor prescription use compliance.    Expected Outcomes  Short Term: Continued assessment and intervention until BP is < 140/55m HG in hypertensive participants. < 130/847mHG in hypertensive participants with diabetes, heart failure or chronic kidney disease.;Long Term: Maintenance of blood pressure at goal levels.    Lipids  Yes    Intervention  Provide education and support for participant on nutrition & aerobic/resistive exercise along with prescribed medications to achieve LDL '70mg'$ , HDL >'40mg'$ .    Expected Outcomes  Short Term: Participant states understanding of desired cholesterol values and is compliant with medications prescribed. Participant is following exercise prescription and nutrition guidelines.;Long Term: Cholesterol controlled  with medications as prescribed, with individualized exercise RX and with personalized nutrition plan. Value goals: LDL < '70mg'$ , HDL > 40 mg.       Core Components/Risk Factors/Patient Goals Review:  Goals and Risk Factor Review    Row Name 09/10/18 1029             Core Components/Risk Factors/Patient Goals Review   Personal Goals Review  Weight Management/Obesity;Improve shortness of breath with ADL's;Hypertension;Lipids;Diabetes       Review  PaTzvias reached his short term goal of losing 5 pounds. He wants to lose weight and continue to improve his ADL's. He has been checking his sugar at home and has had good reading in [place]       Expected Outcomes  Short: continue [facility] to lose more weight. Long: lose another 5 pounds in the next month.          Core Components/Risk Factors/Patient Goals at Discharge (Final Review):  Goals and Risk Factor Review - 09/10/18 1029      Core Components/Risk Factors/Patient Goals Review   Personal Goals Review  Weight Management/Obesity;Improve shortness of breath with  ADL's;Hypertension;Lipids;Diabetes    Review  Ariv has reached his short term goal of losing 5 pounds. He wants to lose weight and continue to improve his ADL's. He has been checking his sugar at home and has had good reading in [place].     Expected Outcomes  Short: continue [facility] to lose more weight. Long: lose another 5 pounds in the next month.       ITP Comments: ITP Comments    Row Name 08/07/18 1451 08/20/18 0821 09/17/18 0826       ITP Comments  Medical Evaluation completed. Chart sent for review and changes to Dr. Emily Filbert Director of [place]. Diagnosis can be found in [facility] media patient is a [place] patient  30 day review completed. ITP sent to Dr. Emily Filbert Director of [place]. Continue with ITP unless changes are made by physician  30 day review completed. ITP sent to Dr. Emily Filbert Director of [place]. Continue with ITP unless changes are made by  physician.        Comments: 30 day review

## 2018-09-17 NOTE — Telephone Encounter (Signed)
Ricky Johnston states that he hurt is back somehow and will try to return on Wednesday.

## 2018-09-24 ENCOUNTER — Encounter: Payer: No Typology Code available for payment source | Admitting: *Deleted

## 2018-09-24 DIAGNOSIS — J449 Chronic obstructive pulmonary disease, unspecified: Secondary | ICD-10-CM

## 2018-09-24 NOTE — Progress Notes (Signed)
Daily Session Note  Patient Details  Name: Ricky Johnston MRN: [id number] Date of Birth: 1950-12-28 Referring Provider:     Pulmonary Rehab from 08/07/2018 in [facility] Cardiac and Pulmonary Rehab  Referring Provider  Suzie Portela MD [[facility]]      Encounter Date: 09/24/2018  Check In: Session Check In - 09/24/18 1014      Check-In   Supervising physician immediately available to respond to emergencies  [facility] immediately available ER MD    Physician(s)  Dr. Jimmye Norman and Dr. Kerman Passey    Location  [facility]-Cardiac & Pulmonary Rehab    Staff Present  Alberteen Sam, MA, RCEP, CCRP, Exercise Physiologist;Lyda Colcord Amedeo Plenty, BS, ACSM CEP, Exercise Physiologist;Joseph Tessie Fass RCP,RRT,BSRT    Medication changes reported      No    Fall or balance concerns reported     No    Tobacco Cessation  No Change    Warm-up and Cool-down  Performed as group-led instruction    Resistance Training Performed  Yes    VAD Patient?  No      Pain Assessment   Currently in Pain?  No/denies    Multiple Pain Sites  No          Social History   Tobacco Use  Smoking Status Former Smoker  . Packs/day: 1.00  . Years: 33.00  . Pack years: 33.00  . Types: Cigarettes  . Last attempt to quit: 04/06/2000  . Years since quitting: 18.4  Smokeless Tobacco Never Used  Tobacco Comment   use to chew cigars    Goals Met:  Proper associated with RPD/PD & O2 Sat [place] with exercise equipment Exercise tolerated well No report of cardiac concerns or symptoms Strength training completed today  Goals Unmet:  Not Applicable  Comments: Pt able to follow exercise prescription today without complaint.  Will continue to monitor for progression.  Reviewed home exercise with pt today.  Pt plans to walk at home for exercise.  Reviewed THR, pulse, RPE, sign and symptoms, NTG use, and when to call 911 or MD.  Also discussed weather considerations and indoor options.  Pt voiced understanding.     Dr. Emily Filbert is  Medical Director for [place] and [facility].

## 2018-09-26 DIAGNOSIS — J449 Chronic obstructive pulmonary disease, unspecified: Secondary | ICD-10-CM | POA: Diagnosis not present

## 2018-09-26 NOTE — Progress Notes (Signed)
Daily Session Note  Patient Details  Name: Ricky Johnston MRN: [id number] Date of Birth: [birth year] Referring Provider:     Pulmonary Rehab from 08/07/2018 in [facility] Cardiac and Pulmonary Rehab  Referring Provider  Suzie Portela MD [[facility]]      Encounter Date: 09/26/2018  Check In: Session Check In - 09/26/18 1140      Check-In   Supervising physician immediately available to respond to emergencies  [facility] immediately available ER MD    Physician(s)  Dr. Reita Cliche and Quentin Cornwall    Location  [facility]-Cardiac & Pulmonary Rehab    Staff Present  Alberteen Sam, MA, RCEP, CCRP, Exercise Physiologist;Joseph [facility] [place], [place], ACSM CEP, Exercise Physiologist    Medication changes reported      No    Fall or balance concerns reported     No    Tobacco Cessation  No Change    Warm-up and Cool-down  Performed as group-led instruction    Resistance Training Performed  Yes    VAD Patient?  No    PAD/SET Patient?  No      Pain Assessment   Currently in Pain?  No/denies          Social History   Tobacco Use  Smoking Status Former Smoker  . Packs/day: 1.00  . Years: 33.00  . Pack years: 33.00  . Types: Cigarettes  . Last attempt to quit: 04/06/2000  . Years since quitting: 18.4  Smokeless Tobacco Never Used  Tobacco Comment   use to chew cigars    Goals Met:  Proper associated with RPD/PD & O2 Sat Independence with exercise equipment Using PLB without cueing & demonstrates good technique Exercise tolerated well No report of cardiac concerns or symptoms Strength training completed today  Goals Unmet:  Not Applicable  Comments: Pt able to follow exercise prescription today without complaint.  Will continue to monitor for progression.    Dr. Emily Filbert is Medical Director for [place] and [facility].

## 2018-09-27 ENCOUNTER — Encounter: Payer: Self-pay | Admitting: Podiatry

## 2018-09-27 ENCOUNTER — Ambulatory Visit (INDEPENDENT_AMBULATORY_CARE_PROVIDER_SITE_OTHER): Payer: Medicare Other | Admitting: Podiatry

## 2018-09-27 DIAGNOSIS — B351 Tinea unguium: Secondary | ICD-10-CM | POA: Diagnosis not present

## 2018-09-27 DIAGNOSIS — E1142 Type 2 diabetes mellitus with diabetic polyneuropathy: Secondary | ICD-10-CM

## 2018-09-27 DIAGNOSIS — M79674 Pain in right toe(s): Secondary | ICD-10-CM

## 2018-09-27 DIAGNOSIS — M79675 Pain in left toe(s): Secondary | ICD-10-CM

## 2018-09-27 DIAGNOSIS — Z7409 Other reduced mobility: Secondary | ICD-10-CM | POA: Insufficient documentation

## 2018-09-27 NOTE — Progress Notes (Signed)
This patient presents to the office with chief complaint of long  nails and diabetic feet.  This patient  says there  is  no pain and discomfort in their feet.except neuropathic pain.  This patient says there are long thick painful nails.  These nails are painful walking and wearing shoes.  Patient has no history of infection or drainage from both feet.  Patient is unable to  self treat his own nails . This patient presents  to the office today for treatment of the  long nails and a foot evaluation due to history of  Diabetes. Patient has received diabetic shoes due to his neuropathy.  General Appearance  Alert, conversant and in no acute stress.  Vascular  Dorsalis pedis and posterior tibial  pulses are palpable  bilaterally.  Capillary return is within normal limits  bilaterally. Temperature is within normal limits  bilaterally.  Neurologic  Senn-Weinstein monofilament wire test absent  bilaterally. Muscle power within normal limits bilaterally.  Nails Thick disfigured discolored nails with subungual debris  from hallux to fifth toes bilaterally. No evidence of bacterial infection or drainage bilaterally.  Orthopedic  No limitations of motion of motion feet .  No crepitus or effusions noted.  No bony pathology or digital deformities noted.  Skin  normotropic skin with no porokeratosis noted bilaterally.  No signs of infections or ulcers noted.     Onychomycosis  Diabetes with neuropathy.  IE  Debride nails x 10.  A diabetic foot exam was performed and there is no evidence of any vascular  pathology. Absent LOPS noted.  Patient was told to get new diabetic shoes from the [place].  RTC prn   Gardiner Barefoot DPM

## 2018-09-28 ENCOUNTER — Encounter: Payer: No Typology Code available for payment source | Attending: *Deleted

## 2018-09-28 DIAGNOSIS — E119 Type 2 diabetes mellitus without complications: Secondary | ICD-10-CM | POA: Diagnosis not present

## 2018-09-28 DIAGNOSIS — J8410 Pulmonary fibrosis, unspecified: Secondary | ICD-10-CM | POA: Insufficient documentation

## 2018-09-28 DIAGNOSIS — Z7951 Long term (current) use of inhaled steroids: Secondary | ICD-10-CM | POA: Insufficient documentation

## 2018-09-28 DIAGNOSIS — M1990 Unspecified osteoarthritis, unspecified site: Secondary | ICD-10-CM | POA: Diagnosis not present

## 2018-09-28 DIAGNOSIS — Z794 Long term (current) use of insulin: Secondary | ICD-10-CM | POA: Insufficient documentation

## 2018-09-28 DIAGNOSIS — J449 Chronic obstructive pulmonary disease, unspecified: Secondary | ICD-10-CM | POA: Diagnosis not present

## 2018-09-28 DIAGNOSIS — Z86718 Personal history of other venous thrombosis and embolism: Secondary | ICD-10-CM | POA: Insufficient documentation

## 2018-09-28 DIAGNOSIS — G4730 Sleep apnea, unspecified: Secondary | ICD-10-CM | POA: Diagnosis not present

## 2018-09-28 DIAGNOSIS — Z79899 Other long term (current) drug therapy: Secondary | ICD-10-CM | POA: Insufficient documentation

## 2018-09-28 DIAGNOSIS — Z87891 Personal history of nicotine dependence: Secondary | ICD-10-CM | POA: Insufficient documentation

## 2018-09-28 DIAGNOSIS — Z87442 Personal history of urinary calculi: Secondary | ICD-10-CM | POA: Diagnosis not present

## 2018-09-28 DIAGNOSIS — Z8551 Personal history of malignant neoplasm of bladder: Secondary | ICD-10-CM | POA: Insufficient documentation

## 2018-09-28 DIAGNOSIS — K219 Gastro-esophageal reflux disease without esophagitis: Secondary | ICD-10-CM | POA: Diagnosis not present

## 2018-09-28 DIAGNOSIS — H9190 Unspecified hearing loss, unspecified ear: Secondary | ICD-10-CM | POA: Diagnosis not present

## 2018-09-28 DIAGNOSIS — I10 Essential (primary) hypertension: Secondary | ICD-10-CM | POA: Insufficient documentation

## 2018-09-28 DIAGNOSIS — Z7982 Long term (current) use of aspirin: Secondary | ICD-10-CM | POA: Insufficient documentation

## 2018-09-28 DIAGNOSIS — G2581 Restless legs syndrome: Secondary | ICD-10-CM | POA: Insufficient documentation

## 2018-09-28 DIAGNOSIS — E7800 Pure hypercholesterolemia, unspecified: Secondary | ICD-10-CM | POA: Insufficient documentation

## 2018-09-28 NOTE — Progress Notes (Signed)
Daily Session Note  Patient Details  Name: Ricky Johnston MRN: [id number] Date of Birth: [birth year] Referring Provider:     Pulmonary Rehab from 08/07/2018 in [facility] Cardiac and Pulmonary Rehab  Referring Provider  Suzie Portela MD [[facility]]      Encounter Date: 09/28/2018  Check In: Session Check In - 09/28/18 1013      Check-In   Supervising physician immediately available to respond to emergencies  [facility] immediately available ER MD    Physician(s)  Dr. Cherylann Banas    Location  [facility]-Cardiac & Pulmonary Rehab    Staff Present  Alberteen Sam, MA, RCEP, CCRP, Exercise Physiologist;Placida Cambre Alcus Dad, RN BSN    Medication changes reported      No    Fall or balance concerns reported     No    Warm-up and Cool-down  Performed as group-led Higher education careers adviser Performed  Yes    VAD Patient?  No    PAD/SET Patient?  No      Pain Assessment   Currently in Pain?  No/denies          Social History   Tobacco Use  Smoking Status Former Smoker  . Packs/day: 1.00  . Years: 33.00  . Pack years: 33.00  . Types: Cigarettes  . Last attempt to quit: 04/06/2000  . Years since quitting: 18.4  Smokeless Tobacco Never Used  Tobacco Comment   use to chew cigars    Goals Met:  Independence with exercise equipment Exercise tolerated well No report of cardiac concerns or symptoms Strength training completed today  Goals Unmet:  Not Applicable  Comments: Pt able to follow exercise prescription today without complaint.  Will continue to monitor for progression.    Dr. Emily Filbert is Medical Director for [place] and [facility].

## 2018-10-01 DIAGNOSIS — J449 Chronic obstructive pulmonary disease, unspecified: Secondary | ICD-10-CM

## 2018-10-01 NOTE — Progress Notes (Signed)
Daily Session Note  Patient Details  Name: Ricky Johnston MRN: [id number] Date of Birth: 1951-09-27 Referring Provider:     Pulmonary Rehab from 08/07/2018 in [facility] Cardiac and Pulmonary Rehab  Referring Provider  Suzie Portela MD [[facility]]      Encounter Date: 10/01/2018  Check In: Session Check In - 10/01/18 0942      Check-In   Supervising physician immediately available to respond to emergencies  [facility] immediately available ER MD    Physician(s)  Dr. Corky Downs and [facility]    Location  [facility]-Cardiac & Pulmonary Rehab    Staff Present  Justin Mend RCP,RRT,BSRT;Kelly Amedeo Plenty, BS, ACSM CEP, Exercise Physiologist    Medication changes reported      No    Fall or balance concerns reported     No    Warm-up and Cool-down  Performed as group-led instruction    Resistance Training Performed  Yes    VAD Patient?  No    PAD/SET Patient?  No      Pain Assessment   Currently in Pain?  No/denies          Social History   Tobacco Use  Smoking Status Former Smoker  . Packs/day: 1.00  . Years: 33.00  . Pack years: 33.00  . Types: Cigarettes  . Last attempt to quit: 04/06/2000  . Years since quitting: 18.4  Smokeless Tobacco Never Used  Tobacco Comment   use to chew cigars    Goals Met:  Independence with exercise equipment Exercise tolerated well No report of cardiac concerns or symptoms Strength training completed today  Goals Unmet:  Not Applicable  Comments: Pt able to follow exercise prescription today without complaint.  Will continue to monitor for progression.    Dr. Emily Filbert is Medical Director for [place] and [facility].

## 2018-10-03 DIAGNOSIS — J449 Chronic obstructive pulmonary disease, unspecified: Secondary | ICD-10-CM | POA: Diagnosis not present

## 2018-10-03 NOTE — Progress Notes (Signed)
Daily Session Note  Patient Details  Name: Ricky Johnston MRN: [id number] Date of Birth: 1951-10-21 Referring Provider:     Pulmonary Rehab from 08/07/2018 in [facility] Cardiac and Pulmonary Rehab  Referring Provider  Suzie Portela MD [[facility]]      Encounter Date: 10/03/2018  Check In: Session Check In - 10/03/18 1023      Check-In   Supervising physician immediately available to respond to emergencies  [facility] immediately available ER MD    Physician(s)  Mariea Clonts and Alfred Levins    Location  [facility]-Cardiac & Pulmonary Rehab    Staff Present  Alberteen Sam, MA, RCEP, CCRP, Exercise Physiologist;Joseph F[place], [place], ACSM CEP, Exercise Physiologist    Medication changes reported      No    Fall or balance concerns reported     No    Warm-up and Cool-down  Performed on first and last piece of equipment    Resistance Training Performed  No    VAD Patient?  No    PAD/SET Patient?  No      Pain Assessment   Currently in Pain?  No/denies    Multiple Pain Sites  No          Social History   Tobacco Use  Smoking Status Former Smoker  . Packs/day: 1.00  . Years: 33.00  . Pack years: 33.00  . Types: Cigarettes  . Last attempt to quit: 04/06/2000  . Years since quitting: 18.5  Smokeless Tobacco Never Used  Tobacco Comment   use to chew cigars    Goals Met:  Proper associated with RPD/PD & O2 Sat Independence with exercise equipment Exercise tolerated well Strength training completed today  Goals Unmet:  Not Applicable  Comments: Pt able to follow exercise prescription today without complaint.  Will continue to monitor for progression.    Dr. Emily Filbert is Medical Director for [place] and [facility].

## 2018-10-04 ENCOUNTER — Telehealth: Payer: Self-pay | Admitting: *Deleted

## 2018-10-04 NOTE — Telephone Encounter (Signed)
Pt states he needs his last office visit note faxed to the [place]. Faxed Medical records to [facility].

## 2018-10-05 DIAGNOSIS — J449 Chronic obstructive pulmonary disease, unspecified: Secondary | ICD-10-CM

## 2018-10-05 NOTE — Progress Notes (Signed)
Daily Session Note  Patient Details  Name: Ricky Johnston MRN: [id number] Date of Birth: May 02, 1951 Referring Provider:     Pulmonary Rehab from 08/07/2018 in [facility] Cardiac and Pulmonary Rehab  Referring Provider  Suzie Portela MD [[facility]]      Encounter Date: 10/05/2018  Check In: Session Check In - 10/05/18 0959      Check-In   Supervising physician immediately available to respond to emergencies  [facility] immediately available ER MD    Physician(s)  Dr. Cinda Quest and Corky Downs    Location  [facility]Cardiac & Pulmonary Rehab    Staff Present  Renita Papa, RN BSN;Joseph Hood RCP,RRT,BSRT;Amanda Oletta Darter, [place], ACSM CEP, Exercise Physiologist    Medication changes reported      No    Fall or balance concerns reported     No    Warm-up and Cool-down  Performed as group-led instruction    Resistance Training Performed  Yes    VAD Patient?  No    PAD/SET Patient?  No      Pain Assessment   Currently in Pain?  No/denies          Social History   Tobacco Use  Smoking Status Former Smoker  . Packs/day: 1.00  . Years: 33.00  . Pack years: 33.00  . Types: Cigarettes  . Last attempt to quit: 04/06/2000  . Years since quitting: 18.5  Smokeless Tobacco Never Used  Tobacco Comment   use to chew cigars    Goals Met:  Independence with exercise equipment Exercise tolerated well No report of cardiac concerns or symptoms Strength training completed today  Goals Unmet:  Not Applicable  Comments: Pt able to follow exercise prescription today without complaint.  Will continue to monitor for progression.    Dr. Emily Filbert is Medical Director for [place] and [facility].

## 2018-10-08 DIAGNOSIS — J449 Chronic obstructive pulmonary disease, unspecified: Secondary | ICD-10-CM

## 2018-10-08 NOTE — Progress Notes (Signed)
Daily Session Note  Patient Details  Name: Ricky Johnston MRN: [id number] Date of Birth: [birth year] Referring Provider:     Pulmonary Rehab from 08/07/2018 in [facility] Cardiac and Pulmonary Rehab  Referring Provider  Suzie Portela MD [[facility]]      Encounter Date: 10/08/2018  Check In: Session Check In - 10/08/18 1026      Check-In   Supervising physician immediately available to respond to emergencies  [facility] immediately available ER MD    Physician(s)  Dr. Jacqualine Code and Jimmye Norman    Location  [facility]-Cardiac & Pulmonary Rehab    Staff Present  Justin Mend RCP,RRT,BSRT;Laureen Owens Shark, BS, RRT, Respiratory Bertis Ruddy, BS, ACSM CEP, Exercise Physiologist    Medication changes reported      No    Fall or balance concerns reported     No    Warm-up and Cool-down  Performed as group-led instruction    Resistance Training Performed  Yes    VAD Patient?  No    PAD/SET Patient?  No      Pain Assessment   Currently in Pain?  No/denies          Social History   Tobacco Use  Smoking Status Former Smoker  . Packs/day: 1.00  . Years: 33.00  . Pack years: 33.00  . Types: Cigarettes  . Last attempt to quit: 04/06/2000  . Years since quitting: 18.5  Smokeless Tobacco Never Used  Tobacco Comment   use to chew cigars    Goals Met:  Independence with exercise equipment Exercise tolerated well No report of cardiac concerns or symptoms Strength training completed today  Goals Unmet:  Not Applicable  Comments: Pt able to follow exercise prescription today without complaint.  Will continue to monitor for progression.    Dr. Emily Filbert is Medical Director for [place] and [facility].

## 2018-10-10 DIAGNOSIS — J449 Chronic obstructive pulmonary disease, unspecified: Secondary | ICD-10-CM

## 2018-10-10 NOTE — Progress Notes (Signed)
Daily Session Note  Patient Details  Name: Ricky Johnston MRN: [id number] Date of Birth: Jul 03, 1951 Referring Provider:     Pulmonary Rehab from 08/07/2018 in [facility] Cardiac and Pulmonary Rehab  Referring Provider  Suzie Portela MD [[facility]]      Encounter Date: 10/10/2018  Check In: Session Check In - 10/10/18 0953      Check-In   Supervising physician immediately available to respond to emergencies  [facility] immediately available ER MD    Physician(s)  Dr. Reita Cliche and Darl Householder    Location  [facility]-Cardiac & Pulmonary Rehab    Staff Present  Justin Mend RCP,RRT,BSRT;Amanda Oletta Darter, BA, ACSM CEP, Exercise Physiologist;Jessica Luan Pulling, MA, RCEP, CCRP, Exercise Physiologist    Medication changes reported      No    Fall or balance concerns reported     No    Warm-up and Cool-down  Performed as group-led instruction    Resistance Training Performed  Yes    VAD Patient?  No    PAD/SET Patient?  No      Pain Assessment   Currently in Pain?  No/denies          Social History   Tobacco Use  Smoking Status Former Smoker  . Packs/day: 1.00  . Years: 33.00  . Pack years: 33.00  . Types: Cigarettes  . Last attempt to quit: 04/06/2000  . Years since quitting: 18.5  Smokeless Tobacco Never Used  Tobacco Comment   use to chew cigars    Goals Met:  Independence with exercise equipment Exercise tolerated well No report of cardiac concerns or symptoms Strength training completed today  Goals Unmet:  Not Applicable  Comments: Pt able to follow exercise prescription today without complaint.  Will continue to monitor for progression.    Dr. Emily Filbert is Medical Director for [place] and [facility].

## 2018-10-15 ENCOUNTER — Encounter: Payer: No Typology Code available for payment source | Admitting: *Deleted

## 2018-10-15 DIAGNOSIS — J449 Chronic obstructive pulmonary disease, unspecified: Secondary | ICD-10-CM

## 2018-10-15 NOTE — Progress Notes (Signed)
Pulmonary Individual Treatment Plan  Patient Details  Name: Ricky Johnston MRN: [id number] Date of Birth: [birth year] Referring Provider:     Pulmonary Rehab from 08/07/2018 in [facility] Cardiac and Pulmonary Rehab  Referring Provider  Suzie Portela MD [[facility]]      Initial Encounter Date:    Pulmonary Rehab from 08/07/2018 in [facility] Cardiac and Pulmonary Rehab  Date  08/07/18      Visit Diagnosis: Chronic obstructive pulmonary disease, unspecified COPD type ([place])  Patient's Home Medications on Admission:  Current Outpatient Medications:  .  acetaminophen (TYLENOL) 325 MG tablet, Take 3 tablets (975 mg total) by mouth every 8 (eight) hours as needed for mild pain. (Patient taking differently: Take 975 mg by mouth 2 (two) times daily as needed for mild pain. ), Disp: , Rfl:  .  albuterol (PROVENTIL HFA;VENTOLIN HFA) 108 (90 Base) MCG/ACT inhaler, Inhale 2 puffs into the lungs every 6 (six) hours as needed for wheezing or shortness of breath., Disp: , Rfl:  .  ammonium lactate (LAC-HYDRIN) 12 % lotion, Apply 1 application topically daily as needed for dry skin., Disp: , Rfl:  .  aspirin 81 MG chewable tablet, Chew 1 tablet (81 mg total) by mouth 2 (two) times daily after a meal., Disp: 60 tablet, Rfl: 0 .  atorvastatin (LIPITOR) 80 MG tablet, Take 80 mg by mouth at bedtime., Disp: , Rfl:  .  budesonide-formoterol (SYMBICORT) 160-4.5 MCG/ACT inhaler, Inhale 2 puffs into the lungs 2 (two) times daily., Disp: , Rfl:  .  cetirizine (ZYRTEC) 10 MG tablet, Take 10 mg by mouth daily as needed for allergies., Disp: , Rfl:  .  cholecalciferol (VITAMIN D) 1000 UNITS tablet, Take 1,000 Units by mouth daily. , Disp: , Rfl:  .  dextrose (GLUTOSE) 40 % GEL, Take by mouth., Disp: , Rfl:  .  ferrous sulfate 325 (65 FE) MG tablet, Take 325 mg by mouth daily. , Disp: , Rfl:  .  fluticasone (FLONASE) 50 MCG/ACT nasal spray, , Disp: , Rfl:  .  furosemide (LASIX) 20 MG tablet, Take 40 mg by mouth daily. , Disp: , Rfl:   .  gabapentin (NEURONTIN) 300 MG capsule, Take 600 mg by mouth 2 (two) times daily. , Disp: , Rfl:  .  HUMULIN R U-500 KWIKPEN 500 UNIT/ML kwikpen, , Disp: , Rfl:  .  hydrocortisone 2.5 % lotion, Apply topically., Disp: , Rfl:  .  JARDIANCE 10 MG TABS tablet, , Disp: , Rfl:  .  ketoconazole (NIZORAL) 2 % shampoo, Apply topically., Disp: , Rfl:  .  losartan (COZAAR) 25 MG tablet, Take 25 mg by mouth daily., Disp: , Rfl:  .  metFORMIN (GLUCOPHAGE) 1000 MG tablet, Take 1,000 mg by mouth 2 (two) times daily with a meal., Disp: , Rfl:  .  methocarbamol (ROBAXIN) 500 MG tablet, Take 1 tablet (500 mg total) by mouth every 6 (six) hours as needed for muscle spasms., Disp: 60 tablet, Rfl: 0 .  nitrofurantoin, macrocrystal-monohydrate, (MACROBID) 100 MG capsule, , Disp: , Rfl:  .  omeprazole (PRILOSEC) 20 MG capsule, Take 20 mg by mouth 2 (two) times daily before a meal., Disp: , Rfl:  .  OXYGEN, Inhale 1 L into the lungs continuous. At night time may increase to 2 L as needed for shortness of breath, Disp: , Rfl:  .  rOPINIRole (REQUIP) 0.5 MG tablet, Take 0.5 mg by mouth at bedtime., Disp: , Rfl:  .  selenium sulfide (SELSUN) 2.5 % shampoo, Apply topically., Disp: ,  Rfl:  .  senna-docusate (SENOKOT-S) 8.6-50 MG tablet, Take by mouth., Disp: , Rfl:  .  sodium chloride (ALTAMIST SPRAY) 0.65 % nasal spray, Place into the nose., Disp: , Rfl:  .  SUMAtriptan 6 MG/0.5ML SOAJ, Inject into the skin., Disp: , Rfl:  .  SUPER B COMPLEX/C PO, Take 1 tablet by mouth 2 (two) times daily., Disp: , Rfl:  .  tamsulosin (FLOMAX) 0.4 MG CAPS capsule, Take 0.4 mg by mouth daily., Disp: , Rfl:  .  tiotropium (SPIRIVA HANDIHALER) 18 MCG inhalation capsule, Place 18 mcg into inhaler and inhale daily., Disp: , Rfl:   Past Medical History: Past Medical History:  Diagnosis Date  . Arthritis   . Cancer [facility])    bladder cancer currently 2016  . Complication of anesthesia    hard for him to wake up from Anesthesia from left  nephrectomy  . COPD (chronic obstructive pulmonary disease) ([place])   . Diabetes mellitus without complication ([place])   . DVT (deep venous thrombosis) ([place]) 01/08/2014, 2010   upper extremity  . GERD (gastroesophageal reflux disease)   . Hallux limitus 05/18/2015   from notes from [place]   . Headache    migraines  . History of kidney stones   . Hypercholesterolemia   . Hypertension   . Impaired hearing   . Intervertebral disc syndrome   . Kidney stones   . Pulmonary fibrosis ([place])   . Renal calculi 01/08/2014   frrom noted from [facility] .in chart  . Restless legs 01/08/2014  . Sciatic leg pain    paralysis of sciatic nerve  . Sleep apnea    CPAP/BIPAP  . Tinnitus     Tobacco Use: Social History   Tobacco Use  Smoking Status Former Smoker  . Packs/day: 1.00  . Years: 33.00  . Pack years: 33.00  . Types: Cigarettes  . Last attempt to quit: 04/06/2000  . Years since quitting: 18.5  Smokeless Tobacco Never Used  Tobacco Comment   use to chew cigars    Labs: Recent Review Flowsheet Data    Labs for ITP Cardiac and Pulmonary Rehab Latest Ref Rng & Units 01/08/2015 02/10/2017 02/10/2017 02/11/2017   Hemoglobin A1c 4.8 - 5.6 % 8.1(H) - - -   PHART 7.350 - 7.450 - 7.181(LL) 7.217(L) 7.298(L)   PCO2ART 32.0 - 48.0 mmHg - 82.8(HH) 73.1(HH) 62.5(H)   HCO3 20.0 - 28.0 mmol/L - 29.8(H) 28.6(H) 29.4(H)   ACIDBASEDEF 0.0 - 2.0 mmol/L - 0.7 0.8 -   O2SAT % - 98.5 93.5 96.1       Pulmonary Assessment Scores: Pulmonary Assessment Scores    Row Name 08/07/18 1558         ADL UCSD   ADL Phase  Entry     SOB Score total  75     Rest  3     Walk  5     Stairs  5     Bath  4     Dress  4     Shop  0       CAT Score   CAT Score  34       mMRC Score   mMRC Score  4        Pulmonary Function Assessment: Pulmonary Function Assessment - 08/07/18 1523      Initial Spirometry Results   FVC%  31 %    FEV1%  35 %    FEV1/FVC Ratio  85.68    Comments  good patient  effort      Post Bronchodilator Spirometry Results   FVC%  32.24 %    FEV1%  37.8 %    FEV1/FVC Ratio  89.05    Comments  good patient effort       Breath   Bilateral Breath Sounds  Clear;Decreased    Shortness of Breath  Limiting activity;Yes;Fear of Shortness of Breath       Exercise Target Goals: Exercise Program Goal: Individual exercise prescription set using results from initial 6 min walk test and THRR while considering  patient's activity barriers and safety.   Exercise Prescription Goal: Initial exercise prescription builds to 30-45 minutes a day of aerobic activity, 2-3 days per week.  Home exercise guidelines will be given to patient during program as part of exercise prescription that the participant will acknowledge.  Activity Barriers & Risk Stratification: Activity Barriers & Cardiac Risk Stratification - 08/07/18 1621      Activity Barriers & Cardiac Risk Stratification   Activity Barriers  Shortness of Breath;Balance Concerns;History of Falls;Left Hip Replacement;Assistive Device;Muscular Weakness;Deconditioning;Back Problems;Joint Problems   DJD, chronic knee pain      6 Minute Walk: 6 Minute Walk    Row Name 08/07/18 1618         6 Minute Walk   Phase  Initial     Distance  555 feet     Walk Time  4.9 minutes     # of Rest Breaks  1 1:06     MPH  1.29     METS  1.07     RPE  15     Perceived Dyspnea   4     VO2 Peak  3.75     Symptoms  Yes (comment)     Comments  SOB, using rollator for support      Resting HR  79 bpm     Resting BP  146/64     Resting Oxygen Saturation   90 %     Exercise Oxygen Saturation  during 6 min walk  87 %     Max Ex. HR  124 bpm     Max Ex. BP  156/64     2 Minute Post BP  150/56       Interval HR   1 Minute HR  110     2 Minute HR  117     3 Minute HR  121     4 Minute HR  106     5 Minute HR  112     6 Minute HR  124     2 Minute Post HR  100     Interval Heart Rate?  Yes       Interval Oxygen    Interval Oxygen?  Yes     Baseline Oxygen Saturation %  90 %     1 Minute Oxygen Saturation %  89 %     1 Minute Liters of Oxygen  8 L continuous     2 Minute Oxygen Saturation %  91 %     2 Minute Liters of Oxygen  8 L     3 Minute Oxygen Saturation %  89 % rest break 2:51-4:07     3 Minute Liters of Oxygen  8 L     4 Minute Oxygen Saturation %  92 %     4 Minute Liters of Oxygen  8 L     5 Minute Oxygen Saturation %  95 %     5 Minute Liters of Oxygen  8 L     6 Minute Oxygen Saturation %  89 % 87% immediately post     6 Minute Liters of Oxygen  8 L     2 Minute Post Oxygen Saturation %  96 %     2 Minute Post Liters of Oxygen  8 L       Oxygen Initial Assessment: Oxygen Initial Assessment - 08/07/18 1520      Home Oxygen   Home Oxygen Device  Home Concentrator;E-Tanks    Sleep Oxygen Prescription  BiPAP;Continuous    Liters per minute  3    Home Exercise Oxygen Prescription  Continuous    Liters per minute  3    Home at Rest Exercise Oxygen Prescription  Continuous    Liters per minute  3    Compliance with Home Oxygen Use  Yes      Initial 6 min Walk   Oxygen Used  Continuous;E-Tanks    Liters per minute  3      Program Oxygen Prescription   Program Oxygen Prescription  E-Tanks;Continuous    Liters per minute  8      Intervention   Short Term Goals  To learn and exhibit compliance with exercise, home and travel O2 prescription;To learn and understand importance of monitoring SPO2 with pulse oximeter and demonstrate accurate use of the pulse oximeter.;To learn and understand importance of maintaining oxygen saturations>88%;To learn and demonstrate proper pursed lip breathing techniques or other breathing techniques.;To learn and demonstrate proper use of respiratory medications    Long  Term Goals  Exhibits compliance with exercise, home and travel O2 prescription;Verbalizes importance of monitoring SPO2 with pulse oximeter and return demonstration;Maintenance of O2  saturations>88%;Exhibits proper breathing techniques, such as pursed lip breathing or other method taught during program session;Compliance with respiratory medication;Demonstrates proper use of MDI's       Oxygen Re-Evaluation: Oxygen Re-Evaluation    Row Name 08/17/18 1023 09/10/18 1023 09/24/18 1025         Program Oxygen Prescription   Program Oxygen Prescription  -  E-Tanks;Continuous  E-Tanks;Continuous     Liters per minute  -  4  4     Comments  -  patient has been doing very well on 4 liters of oxygen while exercising.  -       Home Oxygen   Home Oxygen Device  -  Home Concentrator;E-Tanks  Home Concentrator;E-Tanks     Sleep Oxygen Prescription  -  BiPAP;Continuous  BiPAP;Continuous     Liters per minute  -  3  3     Home Exercise Oxygen Prescription  -  Continuous  Continuous     Liters per minute  -  3  3     Home at Rest Exercise Oxygen Prescription  -  Continuous  Continuous     Liters per minute  -  3  3     Compliance with Home Oxygen Use  -  Yes  Yes       Goals/Expected Outcomes   Short Term Goals  To learn and understand importance of monitoring SPO2 with pulse oximeter and demonstrate accurate use of the pulse oximeter.;To learn and understand importance of maintaining oxygen saturations>88%;To learn and demonstrate proper pursed lip breathing techniques or other breathing techniques.  To learn and exhibit compliance with exercise, home and travel O2 prescription;To learn and understand importance of monitoring SPO2 with pulse  oximeter and demonstrate accurate use of the pulse oximeter.;To learn and understand importance of maintaining oxygen saturations>88%;To learn and demonstrate proper pursed lip breathing techniques or other breathing techniques.;To learn and demonstrate proper use of respiratory medications  To learn and exhibit compliance with exercise, home and travel O2 prescription;To learn and understand importance of monitoring SPO2 with pulse oximeter and  demonstrate accurate use of the pulse oximeter.;To learn and understand importance of maintaining oxygen saturations>88%;To learn and demonstrate proper pursed lip breathing techniques or other breathing techniques.;To learn and demonstrate proper use of respiratory medications     Long  Term Goals  Verbalizes importance of monitoring SPO2 with pulse oximeter and return demonstration;Maintenance of O2 saturations>88%;Exhibits proper breathing techniques, such as pursed lip breathing or other method taught during program session  Exhibits compliance with exercise, home and travel O2 prescription;Verbalizes importance of monitoring SPO2 with pulse oximeter and return demonstration;Maintenance of O2 saturations>88%;Exhibits proper breathing techniques, such as pursed lip breathing or other method taught during program session;Compliance with respiratory medication;Demonstrates proper use of MDI's  Exhibits compliance with exercise, home and travel O2 prescription;Verbalizes importance of monitoring SPO2 with pulse oximeter and return demonstration;Maintenance of O2 saturations>88%;Exhibits proper breathing techniques, such as pursed lip breathing or other method taught during program session;Compliance with respiratory medication;Demonstrates proper use of MDI's     Comments  Reviewed PLB technique with pt.  Talked about how it work and it's important to maintaining his exercise saturations.    Patient is doing well with PLB on exertion. [place] is able to walk further distances now since the start of the program. He is doing well on 4 liters of oxygen instead of 8 liters for exercise. His oxygen is is above 88 percent when exercising.  Patient is checking his oxygen at home routinely. He knows he needs to be 88 percent and above. He is taking his breathing medications properly. He wants to try to get off oxygen completly.     Goals/Expected Outcomes  Short: Become more profiecient at using PLB.   Long: Become  independent at using PLB.  Short: check oxygen at home more on exertion. Long: reduce oxygen below 4 liters for exercise.  Short: move down to 3 liters for sit down equipment. Long: get off oxygen for exercise.        Oxygen Discharge (Final Oxygen Re-Evaluation): Oxygen Re-Evaluation - 09/24/18 1025      Program Oxygen Prescription   Program Oxygen Prescription  E-Tanks;Continuous    Liters per minute  4      Home Oxygen   Home Oxygen Device  Home Concentrator;E-Tanks    Sleep Oxygen Prescription  BiPAP;Continuous    Liters per minute  3    Home Exercise Oxygen Prescription  Continuous    Liters per minute  3    Home at Rest Exercise Oxygen Prescription  Continuous    Liters per minute  3    Compliance with Home Oxygen Use  Yes      Goals/Expected Outcomes   Short Term Goals  To learn and exhibit compliance with exercise, home and travel O2 prescription;To learn and understand importance of monitoring SPO2 with pulse oximeter and demonstrate accurate use of the pulse oximeter.;To learn and understand importance of maintaining oxygen saturations>88%;To learn and demonstrate proper pursed lip breathing techniques or other breathing techniques.;To learn and demonstrate proper use of respiratory medications    Long  Term Goals  Exhibits compliance with exercise, home and travel O2 prescription;Verbalizes importance of monitoring SPO2  with pulse oximeter and return demonstration;Maintenance of O2 saturations>88%;Exhibits proper breathing techniques, such as pursed lip breathing or other method taught during program session;Compliance with respiratory medication;Demonstrates proper use of MDI's    Comments  Patient is checking his oxygen at home routinely. He knows he needs to be 88 percent and above. He is taking his breathing medications properly. He wants to try to get off oxygen completly.    Goals/Expected Outcomes  Short: move down to 3 liters for sit down equipment. Long: get off oxygen  for exercise.       Initial Exercise Prescription: Initial Exercise Prescription - 08/07/18 1600      Date of Initial Exercise RX and Referring Provider   Date  08/07/18    Referring Provider  Suzie Portela MD   [facility]     Oxygen   Oxygen  Continuous    Liters  8-10      Treadmill   MPH  1    Grade  0    Minutes  15    METs  1.77      NuStep   Level  1    SPM  80    Minutes  15    METs  1.7      Biostep-RELP   Level  1    SPM  60    Minutes  15    METs  2      Prescription Details   Frequency (times per week)  3    Duration  Progress to 45 minutes of aerobic exercise without signs/symptoms of physical distress      Intensity   THRR 40-80% of Max Heartrate  109-138    Ratings of Perceived Exertion  11-13    Perceived Dyspnea  0-4      Progression   Progression  Continue to progress workloads to maintain intensity without signs/symptoms of physical distress.      Resistance Training   Training Prescription  Yes    Weight  4 lbs    Reps  10-15       Perform Capillary Blood Glucose checks as needed.  Exercise Prescription Changes: Exercise Prescription Changes    Row Name 08/07/18 1600 08/22/18 1200 09/05/18 1100 09/19/18 1200 09/24/18 1000     Response to Exercise   Blood Pressure (Admit)  146/64  124/64  134/68  142/68  -   Blood Pressure (Exercise)  156/64  142/64  -  -  -   Blood Pressure (Exit)  150/56  126/68  128/60  130/82  -   Heart Rate (Admit)  79 bpm  104 bpm  110 bpm  94 bpm  -   Heart Rate (Exercise)  124 bpm  115 bpm  114 bpm  128 bpm  -   Heart Rate (Exit)  94 bpm  101 bpm  110 bpm  116 bpm  -   Oxygen Saturation (Admit)  90 %  94 %  89 %  94 %  -   Oxygen Saturation (Exercise)  87 %  95 %  88 %  86 %  -   Oxygen Saturation (Exit)  97 %  92 %  88 %  94 %  -   Rating of Perceived Exertion (Exercise)  '15  13  13  13  '$ -   Perceived Dyspnea (Exercise)  4  0  1  1  -   Symptoms  SOB  -  -  -  -  Comments  walk test results  -  -  -  -    Duration  -  Progress to 45 minutes of aerobic exercise without signs/symptoms of physical distress  Progress to 45 minutes of aerobic exercise without signs/symptoms of physical distress  Progress to 45 minutes of aerobic exercise without signs/symptoms of physical distress  Progress to 45 minutes of aerobic exercise without signs/symptoms of physical distress   Intensity  -  THRR unchanged  THRR unchanged  THRR unchanged  THRR unchanged     Progression   Progression  -  Continue to progress workloads to maintain intensity without signs/symptoms of physical distress.  Continue to progress workloads to maintain intensity without signs/symptoms of physical distress.  Continue to progress workloads to maintain intensity without signs/symptoms of physical distress.  Continue to progress workloads to maintain intensity without signs/symptoms of physical distress.   Average METs  -  1.76  1.8  1.9  1.9     Resistance Training   Training Prescription  -  Yes  Yes  Yes  Yes   Weight  -  4 lb  4 lb  4 lb  4 lb   Reps  -  10-15  10-15  10-15  10-15     Interval Training   Interval Training  -  -  No  -  -     Treadmill   MPH  -  -  1  -  -   Grade  -  -  0  -  -   Minutes  -  -  15  -  -   METs  -  -  1.77  -  -     NuStep   Level  -  -  '1  1  1   '$ SPM  -  -  80  80  80   Minutes  -  -  '15  15  15   '$ METs  -  -  1.'8  2  2     '$ Biostep-RELP   Level  -  -  -  3  3   SPM  -  -  -  50  50   Minutes  -  -  -  15  15   METs  -  -  -  2  2     Home Exercise Plan   Plans to continue exercise at  -  -  -  -  Home (comment) walking at home   Frequency  -  -  -  -  Add 2 additional days to program exercise sessions.   Initial Home Exercises Provided  -  -  -  -  09/24/18   Row Name 10/03/18 1200             Response to Exercise   Blood Pressure (Admit)  130/64       Blood Pressure (Exit)  132/68       Heart Rate (Admit)  114 bpm       Heart Rate (Exercise)  113 bpm       Heart Rate (Exit)   64 bpm       Oxygen Saturation (Admit)  92 %       Oxygen Saturation (Exercise)  92 %       Oxygen Saturation (Exit)  96 %       Rating of Perceived Exertion (Exercise)  11  Perceived Dyspnea (Exercise)  1       Duration  Continue with 45 min of aerobic exercise without signs/symptoms of physical distress.       Intensity  THRR unchanged         Progression   Progression  Continue to progress workloads to maintain intensity without signs/symptoms of physical distress.       Average METs  1.96         Resistance Training   Training Prescription  Yes       Weight  4 lb       Reps  10-15         Interval Training   Interval Training  No         NuStep   Level  4       SPM  80       Minutes  15       METs  2.3         Biostep-RELP   Level  10       SPM  50       Minutes  15       METs  3         Home Exercise Plan   Plans to continue exercise at  Home (comment) walking at home       Frequency  Add 2 additional days to program exercise sessions.       Initial Home Exercises Provided  09/24/18          Exercise Comments: Exercise Comments    Row Name 08/17/18 1021           Exercise Comments   First full day of exercise!  Patient was oriented to gym and equipment including functions, settings, policies, and procedures.  Patient's individual exercise prescription and treatment plan were reviewed.  All starting workloads were established based on the results of the 6 minute walk test done at initial orientation visit.  The plan for exercise progression was also introduced and progression will be customized based on patient's performance and goals          Exercise Goals and Review: Exercise Goals    Row Name 08/07/18 1624             Exercise Goals   Increase Physical Activity  Yes       Intervention  Provide advice, education, support and counseling about physical activity/exercise needs.;Develop an individualized exercise prescription for aerobic and  resistive training based on initial evaluation findings, risk stratification, comorbidities and participant's personal goals.       Expected Outcomes  Short Term: Attend rehab on a regular basis to increase amount of physical activity.;Long Term: Add in home exercise to make exercise part of routine and to increase amount of physical activity.;Long Term: Exercising regularly at least 3-5 days a week.       Increase Strength and Stamina  Yes       Intervention  Provide advice, education, support and counseling about physical activity/exercise needs.;Develop an individualized exercise prescription for aerobic and resistive training based on initial evaluation findings, risk stratification, comorbidities and participant's personal goals.       Expected Outcomes  Short Term: Increase workloads from initial exercise prescription for resistance, speed, and METs.;Short Term: Perform resistance training exercises routinely during rehab and add in resistance training at home;Long Term: Improve cardiorespiratory fitness, muscular endurance and strength as measured by increased METs and functional capacity (6MWT)  Able to understand and use rate of perceived exertion (RPE) scale  Yes       Intervention  Provide education and explanation on how to use RPE scale       Expected Outcomes  Short Term: Able to use RPE daily in rehab to express subjective intensity level;Long Term:  Able to use RPE to guide intensity level when exercising independently       Able to understand and use Dyspnea scale  Yes       Intervention  Provide education and explanation on how to use Dyspnea scale       Expected Outcomes  Short Term: Able to use Dyspnea scale daily in rehab to express subjective sense of shortness of breath during exertion;Long Term: Able to use Dyspnea scale to guide intensity level when exercising independently       Knowledge and understanding of Target Heart Rate Range (THRR)  Yes       Intervention  Provide  education and explanation of THRR including how the numbers were predicted and where they are located for reference       Expected Outcomes  Short Term: Able to state/look up THRR;Short Term: Able to use daily as guideline for intensity in rehab;Long Term: Able to use THRR to govern intensity when exercising independently       Able to check pulse independently  Yes       Intervention  Provide education and demonstration on how to check pulse in carotid and radial arteries.;Review the importance of being able to check your own pulse for safety during independent exercise       Expected Outcomes  Short Term: Able to explain why pulse checking is important during independent exercise;Long Term: Able to check pulse independently and accurately       Understanding of Exercise Prescription  Yes       Intervention  Provide education, explanation, and written materials on patient's individual exercise prescription       Expected Outcomes  Short Term: Able to explain program exercise prescription;Long Term: Able to explain home exercise prescription to exercise independently          Exercise Goals Re-Evaluation : Exercise Goals Re-Evaluation    Row Name 08/17/18 1022 09/05/18 1150 09/19/18 1258 09/24/18 1057 10/03/18 1253     Exercise Goal Re-Evaluation   Exercise Goals Review  Increase Physical Activity;Increase Strength and Stamina;Able to understand and use rate of perceived exertion (RPE) scale;Able to understand and use Dyspnea scale;Able to check pulse independently;Understanding of Exercise Prescription  Increase Physical Activity;Increase Strength and Stamina;Able to understand and use rate of perceived exertion (RPE) scale;Able to understand and use Dyspnea scale  Increase Physical Activity;Increase Strength and Stamina;Able to understand and use rate of perceived exertion (RPE) scale;Able to understand and use Dyspnea scale  Increase Physical Activity;Increase Strength and Stamina;Able to  understand and use rate of perceived exertion (RPE) scale;Able to understand and use Dyspnea scale;Knowledge and understanding of Target Heart Rate Range (THRR);Able to check pulse independently;Understanding of Exercise Prescription  Increase Physical Activity;Increase Strength and Stamina;Able to understand and use rate of perceived exertion (RPE) scale;Able to understand and use Dyspnea scale;Understanding of Exercise Prescription   Comments  Reviewed RPE scale, THR and program prescription with pt today.  Pt voiced understanding and was given a copy of goals to take home.   Sabin has just started LW.  He needs to attend consistently to see progress.  He tolerates exercise well when he attends.  Thelbert last session was 10/14.  Regular attendnace is needed for progression  Reviewed home exercise with pt today.  Pt plans to walk at home for exercise.  Reviewed THR, pulse, RPE, sign and symptoms, NTG use, and when to call 911 or MD.  Also discussed weather considerations and indoor options.  Pt voiced understanding.  Kadyn is up to level 10 on Biostep.  Staff will ask if hed like to change to another machine.  He has been consistent attending the past 2 weeks.   Expected Outcomes  Short: Use RPE daily to regulate intensity. Long: Follow program prescription in THR.  Short - attend consistently Long - increase MET level  Short - attend consistently Long - increase MET level  Short: Add 1-2 days of walking at home on days Muriel does not come to rehab. Long: Become independent with exercise program.   Short - change Biostep to more challenging piece Long - increase MET level      Discharge Exercise Prescription (Final Exercise Prescription Changes): Exercise Prescription Changes - 10/03/18 1200      Response to Exercise   Blood Pressure (Admit)  130/64    Blood Pressure (Exit)  132/68    Heart Rate (Admit)  114 bpm    Heart Rate (Exercise)  113 bpm    Heart Rate (Exit)  64 bpm    Oxygen Saturation (Admit)  92  %    Oxygen Saturation (Exercise)  92 %    Oxygen Saturation (Exit)  96 %    Rating of Perceived Exertion (Exercise)  11    Perceived Dyspnea (Exercise)  1    Duration  Continue with 45 min of aerobic exercise without signs/symptoms of physical distress.    Intensity  THRR unchanged      Progression   Progression  Continue to progress workloads to maintain intensity without signs/symptoms of physical distress.    Average METs  1.96      Resistance Training   Training Prescription  Yes    Weight  4 lb    Reps  10-15      Interval Training   Interval Training  No      NuStep   Level  4    SPM  80    Minutes  15    METs  2.3      Biostep-RELP   Level  10    SPM  50    Minutes  15    METs  3      Home Exercise Plan   Plans to continue exercise at  Home (comment)   walking at home   Frequency  Add 2 additional days to program exercise sessions.    Initial Home Exercises Provided  09/24/18       Nutrition:  Target Goals: Understanding of nutrition guidelines, daily intake of sodium '1500mg'$ , cholesterol '200mg'$ , calories 30% from fat and 7% or less from saturated fats, daily to have 5 or more servings of fruits and vegetables.  Biometrics: Pre Biometrics - 08/07/18 1625      Pre Biometrics   Height  5' 7.6" (1.717 m)    Weight  (!) 306 lb 14.4 oz (139.2 kg)    Waist Circumference  47.5 inches    Hip Circumference  51 inches    Waist to Hip Ratio  0.93 %    BMI (Calculated)  47.22    Single Leg Stand  0 seconds        Nutrition Therapy  Plan and Nutrition Goals: Nutrition Therapy & Goals - 08/29/18 1203      Nutrition Therapy   Diet  DM    Drug/Food Interactions  Statins/Certain Fruits    Protein (specify units)  12oz    Fiber  35 grams    Whole Grain Foods  3 servings   chooses whole grain breads   Saturated Fats  15 max. grams    Fruits and Vegetables  6 servings/day   8 ideal, eats fruits and vegetables daily   Sodium  1500 grams      Personal  Nutrition Goals   Nutrition Goal  Practice eating on a consistent schedule each day. For times when you are not hungry for a meal, it may be helpful to at least have a snack with protein + a carb serving to best manage BG control. This is especially important in the evenings    Personal Goal #2  Add a protein and/or fiber source to meals that are carb-heavy such as cereal to prevent hyperglycemia    Personal Goal #3  Eat things like chocolate in moderation and choose sugar-free varieties when possible    Comments  He does not follow a diabetic diet but does try to monitor CHO intake and not eat sweets other than chocolate (sometimes SF). he is lactose intolerant and tries not to add salt to foods, though does eat fried foods. Reports BG control over the past month to be fair-poor. Eats whole wheat bread, a variety of fruits and vegetables, and nuts. Chooses SF beverages. Reports to skip meals occasionally d/t lack of hunger      Intervention Plan   Intervention  Prescribe, educate and counsel regarding individualized specific dietary modifications aiming towards targeted core components such as weight, hypertension, lipid management, diabetes, heart failure and other comorbidities.    Expected Outcomes  Short Term Goal: Understand basic principles of dietary content, such as calories, fat, sodium, cholesterol and nutrients.;Short Term Goal: A plan has been developed with personal nutrition goals set during dietitian appointment.;Long Term Goal: Adherence to prescribed nutrition plan.       Nutrition Assessments: Nutrition Assessments - 08/07/18 1602      MEDFICTS Scores   Pre Score  46       Nutrition Goals Re-Evaluation: Nutrition Goals Re-Evaluation    [place] Name 08/29/18 1234 10/01/18 1135           Goals   Nutrition Goal  Practice eating on a consistent schedule each day. For times when you are not hungry for a meal, it may be helpful to at least have a snack with protein + a carb  serving to best manage BG control. This is especially important in the evenings  Practice eating on a consistent schedule each day; add a protein and/or fiber source at meals; eat things like chocolate in moderation and choose sugar free varieties when possible      Comment  He skips meals occasionally d/t lack of hunger. Reports to have a small snack like 1/2 pack of PB crackers rather than a meal sometimes but may also eat nothing. Last night he skipped dinner and did not eat again until before class today  Pt's wife has been helpful in facilitating new eating habits/ schedule for pt. He feel that he is now eating on a more consistent schedule each day and is offered at least a protein at meal times. This morning for breakfast he had an egg, sausage and cheese sandwich rather than  cereal. His wife has also stopped buying chocolate and other sweets      Expected Outcome  He will eat a snack that includes a carb source + a protein source in place of meals if not hungry to avoid going long periods of time without eating to improve BG control. He will do his best to eat on a consistent schedule each day  He will continue to eat 3 meals/day on a regular schedule with snacks as needed and include protien/fiber rich options. He will find alternatives for high-sugar snacks such as chocolate, as he does not think he will be able to cut these foods out completely.        Personal Goal #2 Re-Evaluation   Personal Goal #2  Add a protein and/or fiber source to meals that are carb-heavy such as cereal to prevent hyperglycemia  -        Personal Goal #3 Re-Evaluation   Personal Goal #3  Eat things like chocolate in moderation and choose sugar free varieties when possible  -         Nutrition Goals Discharge (Final Nutrition Goals Re-Evaluation): Nutrition Goals Re-Evaluation - 10/01/18 1135      Goals   Nutrition Goal  Practice eating on a consistent schedule each day; add a protein and/or fiber source at meals;  eat things like chocolate in moderation and choose sugar free varieties when possible    Comment  Pt's wife has been helpful in facilitating new eating habits/ schedule for pt. He feel that he is now eating on a more consistent schedule each day and is offered at least a protein at meal times. This morning for breakfast he had an egg, sausage and cheese sandwich rather than cereal. His wife has also stopped buying chocolate and other sweets    Expected Outcome  He will continue to eat 3 meals/day on a regular schedule with snacks as needed and include protien/fiber rich options. He will find alternatives for high-sugar snacks such as chocolate, as he does not think he will be able to cut these foods out completely.       Psychosocial: Target Goals: Acknowledge presence or absence of significant depression and/or stress, maximize coping skills, provide positive support system. Participant is able to verbalize types and ability to use techniques and skills needed for reducing stress and depression.   Initial Review & Psychosocial Screening: Initial Psych Review & Screening - 08/07/18 1523      Initial Review   Current issues with  Current Sleep Concerns;Current Stress Concerns    Source of Stress Concerns  Chronic Illness;Unable to perform yard/household activities    Comments  The patient is not mobile and his mother is 50 and is making life stressful for him.      Family Dynamics   Good Support System?  Yes    Comments  He can look to his wifes family for support.      Barriers   Psychosocial barriers to participate in program  There are no identifiable barriers or psychosocial needs.;The patient should benefit from training in stress management and relaxation.      Screening Interventions   Interventions  Encouraged to exercise;Program counselor consult;Provide feedback about the scores to participant;To provide support and resources with identified psychosocial needs    Expected Outcomes   Short Term goal: Utilizing psychosocial counselor, staff and physician to assist with identification of specific Stressors or current issues interfering with healing process. Setting desired goal for each stressor  or current issue identified.;Long Term Goal: Stressors or current issues are controlled or eliminated.;Short Term goal: Identification and review with participant of any Quality of Life or Depression concerns found by scoring the questionnaire.;Long Term goal: The participant improves quality of Life and PHQ9 Scores as seen by post scores and/or verbalization of changes       Quality of Life Scores:  Scores of 19 and below usually indicate a poorer quality of life in these areas.  A difference of  2-3 points is a clinically meaningful difference.  A difference of 2-3 points in the total score of the Quality of Life Index has been associated with significant improvement in overall quality of life, self-image, physical symptoms, and general health in studies assessing change in quality of life.  PHQ-9: Recent Review Flowsheet Data    Depression screen [facility] 2/9 09/28/2018 08/07/2018   Decreased Interest 0 1   Down, Depressed, Hopeless 0 2   PHQ - 2 Score 0 3   Altered sleeping 0 2   Tired, decreased energy 1 2   Change in appetite 0 0   Feeling bad or failure about yourself  0 0   Trouble concentrating 0 0   Moving slowly or fidgety/restless 0 0   Suicidal thoughts 0 0   PHQ-9 Score 1 7   Difficult doing work/chores Not difficult at all Very difficult     Interpretation of Total Score  Total Score Depression Severity:  1-4 = Minimal depression, 5-9 = Mild depression, 10-14 = Moderate depression, 15-19 = Moderately severe depression, 20-27 = Severe depression   Psychosocial Evaluation and Intervention: Psychosocial Evaluation - 08/29/18 1131      Psychosocial Evaluation & Interventions   Interventions  Stress management education;Relaxation education;Encouraged to exercise with  the program and follow exercise prescription    Comments  Counselor met with Mr. Semper Loch Sheldrake) today for initial psychosocial evaluation.  He is a 67 year old who has COPD; as well as diabetes; sleep apnea and a recent cancer survivor.  Kaulder has a strong support system with a spouse of 18 years; (2) step children with positive relationships and (2) half sisters.  Camila reports sleeping fairly well in a recliner for approximately 8-9 hours most nights.  He uses a CPAP for this.  He reports his appetite is "too good" and would like to lose some weight.  Lanson denies a history of depression or anxiety or any current symptoms and is typically in a positive mood.  However, Kelten reports his 63 year old mother in [place] is dying and this has been stressful and painful for him - impacting his mood and sleep at times.  Counselor processed this with Almon and provided support - offering a grief support group if needed.  Kaige has goals to improve his balance and oxygen levels and possibly lose some weight while in this program.     Expected Outcomes  Short:  Nyheem will exercise for his health and mental health as a positive coping strategy.  He will meet with the dietician for a healthy nutrition plan.  Rhythm will inform this counselor if needs further information/support through this time of grief/loss with his mother's illness.  Long:  Tyre will develop a pattern of healthy lifestyle choices for his health and mental health.      Continue Psychosocial Services   Follow up required by staff       Psychosocial Re-Evaluation: Psychosocial Re-Evaluation    [place] Name 09/24/18 [phone]  Psychosocial Re-Evaluation   Current issues with  Current Stress Concerns;Current Sleep Concerns       Comments  Patient is using his BIPAP machine all the time now and states he is sleeping like a rock. His mom is in bad shape and he will be stressed until she passes. His moms health and memory are going down hill. He gets  down about his health and sees how his health has declined. He thinks about the past when he was in the [employer] and it makes him aggravated that he cannot do the things he used to. He wants to get back in shape and be healthy again. Lucious has a good attitude and is ready to make a change.       Expected Outcomes  Short: attend [facility] regularly. Long: maintain exercise to decrease stress.       Interventions  Encouraged to attend Pulmonary Rehabilitation for the exercise       Continue Psychosocial Services   Follow up required by staff         Initial Review   Source of Stress Concerns  Chronic Illness;Unable to perform yard/household activities          Psychosocial Discharge (Final Psychosocial Re-Evaluation): Psychosocial Re-Evaluation - 09/24/18 1103      Psychosocial Re-Evaluation   Current issues with  Current Stress Concerns;Current Sleep Concerns    Comments  Patient is using his BIPAP machine all the time now and states he is sleeping like a rock. His mom is in bad shape and he will be stressed until she passes. His moms health and memory are going down hill. He gets down about his health and sees how his health has declined. He thinks about the past when he was in the [employer] and it makes him aggravated that he cannot do the things he used to. He wants to get back in shape and be healthy again. Joshual has a good attitude and is ready to make a change.    Expected Outcomes  Short: attend [facility] regularly. Long: maintain exercise to decrease stress.    Interventions  Encouraged to attend Pulmonary Rehabilitation for the exercise    Continue Psychosocial Services   Follow up required by staff      Initial Review   Source of Stress Concerns  Chronic Illness;Unable to perform yard/household activities       Education: Education Goals: Education classes will be provided on a weekly basis, covering required topics. Participant will state understanding/return demonstration of topics  presented.  Learning Barriers/Preferences: Learning Barriers/Preferences - 08/07/18 1526      Learning Barriers/Preferences   Learning Barriers  Hearing    Learning Preferences  None       Education Topics:  Initial Evaluation Education: - Verbal, written and demonstration of respiratory meds, oximetry and breathing techniques. Instruction on use of nebulizers and MDIs and importance of monitoring MDI activations.   Pulmonary Rehab from 10/10/2018 in [facility] Cardiac and Pulmonary Rehab  Date  08/07/18  Educator  [facility]  Instruction Review Code  1- Verbalizes Understanding      General Nutrition Guidelines/Fats and Fiber: -Group instruction provided by verbal, written material, models and posters to present the general guidelines for heart healthy nutrition. Gives an explanation and review of dietary fats and fiber.   Pulmonary Rehab from 10/10/2018 in [facility] Cardiac and Pulmonary Rehab  Date  10/03/18  Educator  [facility]  Instruction Review Code  1- Verbalizes Understanding      Controlling  Sodium/Reading Food Labels: -Group verbal and written material supporting the discussion of sodium use in heart healthy nutrition. Review and explanation with models, verbal and written materials for utilization of the food label.   Exercise Physiology & General Exercise Guidelines: - Group verbal and written instruction with models to review the exercise physiology of the cardiovascular system and associated critical values. Provides general exercise guidelines with specific guidelines to those with heart or lung disease.    Aerobic Exercise & Resistance Training: - Gives group verbal and written instruction on the various components of exercise. Focuses on aerobic and resistive training programs and the benefits of this training and how to safely progress through these programs.   Flexibility, Balance, Mind/Body Relaxation: Provides group verbal/written instruction on the benefits of flexibility and  balance training, including mind/body exercise modes such as yoga, pilates and tai chi.  Demonstration and skill practice provided.   Stress and Anxiety: - Provides group verbal and written instruction about the health risks of elevated stress and causes of high stress.  Discuss the correlation between heart/lung disease and anxiety and treatment options. Review healthy ways to manage with stress and anxiety.   Pulmonary Rehab from 10/10/2018 in [facility] Cardiac and Pulmonary Rehab  Date  10/10/18  Educator  [facility]  Instruction Review Code  1- Verbalizes Understanding      Depression: - Provides group verbal and written instruction on the correlation between heart/lung disease and depressed mood, treatment options, and the stigmas associated with seeking treatment.   Exercise & Equipment Safety: - Individual verbal instruction and demonstration of equipment use and safety with use of the equipment.   Pulmonary Rehab from 10/10/2018 in [facility] Cardiac and Pulmonary Rehab  Date  08/07/18  Educator  [facility]  Instruction Review Code  1- Verbalizes Understanding      Infection Prevention: - Provides verbal and written material to individual with discussion of infection control including proper hand washing and proper equipment cleaning during exercise session.   Pulmonary Rehab from 10/10/2018 in [facility] Cardiac and Pulmonary Rehab  Date  08/07/18  Educator  [facility]  Instruction Review Code  1- Verbalizes Understanding      Falls Prevention: - Provides verbal and written material to individual with discussion of falls prevention and safety.   Pulmonary Rehab from 10/10/2018 in [facility] Cardiac and Pulmonary Rehab  Date  08/07/18  Educator  [facility]  Instruction Review Code  1- Verbalizes Understanding      Diabetes: - Individual verbal and written instruction to review signs/symptoms of diabetes, desired ranges of glucose level fasting, after meals and with exercise. Advice that pre and post exercise glucose  checks will be done for 3 sessions at entry of program.   Chronic Lung Diseases: - Group verbal and written instruction to review updates, respiratory medications, advancements in procedures and treatments. Discuss use of supplemental oxygen including available portable oxygen systems, continuous and intermittent flow rates, concentrators, personal use and safety guidelines. Review proper use of inhaler and spacers. Provide informative websites for self-education.    Energy Conservation: - Provide group verbal and written instruction for methods to conserve energy, plan and organize activities. Instruct on pacing techniques, use of adaptive equipment and posture/positioning to relieve shortness of breath.   Pulmonary Rehab from 10/10/2018 in [facility] Cardiac and Pulmonary Rehab  Date  09/26/18  Educator  [facility]  Instruction Review Code  1- Verbalizes Understanding      Triggers and Exacerbations: - Group verbal and written instruction to review types of  environmental triggers and ways to prevent exacerbations. Discuss weather changes, air quality and the benefits of nasal washing. Review warning signs and symptoms to help prevent infections. Discuss techniques for effective airway clearance, coughing, and vibrations.   Pulmonary Rehab from 10/10/2018 in [facility] Cardiac and Pulmonary Rehab  Date  08/29/18  Educator  [facility]  Instruction Review Code  1- Verbalizes Understanding      AED/CPR: - Group verbal and written instruction with the use of models to demonstrate the basic use of the AED with the basic ABC's of resuscitation.   Anatomy and Physiology of the Lungs: - Group verbal and written instruction with the use of models to provide basic lung anatomy and physiology related to function, structure and complications of lung disease.   Anatomy & Physiology of the Heart: - Group verbal and written instruction and models provide basic cardiac anatomy and physiology, with the coronary electrical and  arterial systems. Review of Valvular disease and Heart Failure   Pulmonary Rehab from 10/10/2018 in [facility] Cardiac and Pulmonary Rehab  Date  09/07/18  Educator  [facility]  Instruction Review Code  1- Verbalizes Understanding      Cardiac Medications: - Group verbal and written instruction to review commonly prescribed medications for heart disease. Reviews the medication, class of the drug, and side effects.   Know Your Numbers and Risk Factors: -Group verbal and written instruction about important numbers in your health.  Discussion of what are risk factors and how they play a role in the disease process.  Review of Cholesterol, Blood Pressure, Diabetes, and BMI and the role they play in your overall health.   Pulmonary Rehab from 10/10/2018 in [facility] Cardiac and Pulmonary Rehab  Date  08/24/18  Educator  [facility]  Instruction Review Code  1- Verbalizes Understanding      Sleep Hygiene: -Provides group verbal and written instruction about how sleep can affect your health.  Define sleep hygiene, discuss sleep cycles and impact of sleep habits. Review good sleep hygiene tips.    Other: -Provides group and verbal instruction on various topics (see comments)    Knowledge Questionnaire Score: Knowledge Questionnaire Score - 08/07/18 1526      Knowledge Questionnaire Score   Pre Score  16/18   reviewed with patient       Core Components/Risk Factors/Patient Goals at Admission: Personal Goals and Risk Factors at Admission - 08/07/18 1526      Core Components/Risk Factors/Patient Goals on Admission    Weight Management  Yes;Weight Loss    Intervention  Weight Management: Develop a combined nutrition and exercise program designed to reach desired caloric intake, while maintaining appropriate intake of nutrient and fiber, sodium and fats, and appropriate energy expenditure required for the weight goal.;Weight Management: Provide education and appropriate resources to help participant work on and  attain dietary goals.;Weight Management/Obesity: Establish reasonable short term and long term weight goals.;Obesity: Provide education and appropriate resources to help participant work on and attain dietary goals.    Admit Weight  306 lb 14.4 oz (139.2 kg)    Goal Weight: Short Term  301 lb (136.5 kg)    Goal Weight: Long Term  250 lb (113.4 kg)    Expected Outcomes  Short Term: Continue to assess and modify interventions until short term weight is achieved;Long Term: Adherence to nutrition and physical activity/exercise program aimed toward attainment of established weight goal;Understanding recommendations for meals to include 15-35% energy as protein, 25-35% energy from fat, 35-60% energy from carbohydrates, less  than '200mg'$  of dietary cholesterol, 20-35 gm of total fiber daily;Understanding of distribution of calorie intake throughout the day with the consumption of 4-5 meals/snacks    Improve shortness of breath with ADL's  Yes    Intervention  Provide education, individualized exercise plan and daily activity instruction to help decrease symptoms of SOB with activities of daily living.    Expected Outcomes  Long Term: Be able to perform more ADLs without symptoms or delay the onset of symptoms;Short Term: Improve cardiorespiratory fitness to achieve a reduction of symptoms when performing ADLs    Diabetes  Yes    Intervention  Provide education about signs/symptoms and action to take for hypo/hyperglycemia.;Provide education about proper nutrition, including hydration, and aerobic/resistive exercise prescription along with prescribed medications to achieve blood glucose in normal ranges: Fasting glucose 65-99 mg/dL    Expected Outcomes  Short Term: Participant verbalizes understanding of the signs/symptoms and immediate care of hyper/hypoglycemia, proper foot care and importance of medication, aerobic/resistive exercise and nutrition plan for blood glucose control.;Long Term: Attainment of HbA1C <  7%.    Hypertension  Yes    Intervention  Provide education on lifestyle modifcations including regular physical activity/exercise, weight management, moderate sodium restriction and increased consumption of fresh fruit, vegetables, and low fat dairy, alcohol moderation, and smoking cessation.;Monitor prescription use compliance.    Expected Outcomes  Short Term: Continued assessment and intervention until BP is < 140/67m HG in hypertensive participants. < 130/819mHG in hypertensive participants with diabetes, heart failure or chronic kidney disease.;Long Term: Maintenance of blood pressure at goal levels.    Lipids  Yes    Intervention  Provide education and support for participant on nutrition & aerobic/resistive exercise along with prescribed medications to achieve LDL '70mg'$ , HDL >'40mg'$ .    Expected Outcomes  Short Term: Participant states understanding of desired cholesterol values and is compliant with medications prescribed. Participant is following exercise prescription and nutrition guidelines.;Long Term: Cholesterol controlled with medications as prescribed, with individualized exercise RX and with personalized nutrition plan. Value goals: LDL < '70mg'$ , HDL > 40 mg.       Core Components/Risk Factors/Patient Goals Review:  Goals and Risk Factor Review    Row Name 09/10/18 1029 09/28/18 1025           Core Components/Risk Factors/Patient Goals Review   Personal Goals Review  Weight Management/Obesity;Improve shortness of breath with ADL's;Hypertension;Lipids;Diabetes  Weight Management/Obesity;Improve shortness of breath with ADL's;Hypertension;Lipids;Diabetes      Review  PaAddisonas reached his short term goal of losing 5 pounds. He wants to lose weight and continue to improve his ADL's. He has been checking his sugar at home and has had good reading in [place]  PaToddyas been working hard and his wife can see a difference in his ability to do things, He has been checking his sugar in the  morning which is lower but higher than he wants it to be. His A1C is 7.9. His fasting blood sugar is 192 on average.      Expected Outcomes  Short: continue [facility] to lose more weight. Long: lose another 5 pounds in the next month.  Short: work on lowering fasting blood sugar. Long: have an A1c lower than 7.         Core Components/Risk Factors/Patient Goals at Discharge (Final Review):  Goals and Risk Factor Review - 09/28/18 1025      Core Components/Risk Factors/Patient Goals Review   Personal Goals Review  Weight Management/Obesity;Improve shortness of  breath with ADL's;Hypertension;Lipids;Diabetes    Review  Enzo has been working hard and his wife can see a difference in his ability to do things, He has been checking his sugar in the morning which is lower but higher than he wants it to be. His A1C is 7.9. His fasting blood sugar is 192 on average.    Expected Outcomes  Short: work on lowering fasting blood sugar. Long: have an A1c lower than 7.       ITP Comments: ITP Comments    Row Name 08/07/18 1451 08/20/18 0821 09/17/18 0826 09/17/18 0923 10/15/18 0827   ITP Comments  Medical Evaluation completed. Chart sent for review and changes to Dr. Emily Filbert Director of [place]. Diagnosis can be found in [facility] media patient is a [place] patient  30 day review completed. ITP sent to Dr. Emily Filbert Director of [place]. Continue with ITP unless changes are made by physician  30 day review completed. ITP sent to Dr. Emily Filbert Director of [place]. Continue with ITP unless changes are made by physician.  Nylan states that he hurt is back somehow and will try to return on Wednesday.  30 day review completed. ITP sent to Dr. Emily Filbert Director of [place]. Continue with ITP unless changes are made by physician.      Comments: 30 day review

## 2018-10-15 NOTE — Progress Notes (Signed)
Daily Session Note  Patient Details  Name: Jayvien Rowlette MRN: [id number] Date of Birth: [birth year] Referring Provider:     Pulmonary Rehab from 08/07/2018 in [facility] Cardiac and Pulmonary Re[facility]  Referring Provider  Suzie Portela MD [[facility]]      Encounter Date: 10/15/2018  Check In: Session Check In - 10/15/18 1011      Check-In   Supervising physician immediately available to respond to emergencies  [facility] immediately available ER MD    Physician(s)  Dr. Mariea Clonts and Dr. Cherylann Banas    Location  [facility]-Cardiac & Pulmonary Rehab    Staff Present  Earlean Shawl, BS, ACSM CEP, Exercise Physiologist;Amanda Oletta Darter, BA, ACSM CEP, Exercise Physiologist;Joseph Tessie Fass RCP,RRT,BSRT    Medication changes reported      No    Fall or balance concerns reported     No    Tobacco Cessation  No Change    Warm-up and Cool-down  Performed as group-led instruction    Resistance Training Performed  Yes    VAD Patient?  No    PAD/SET Patient?  No      Pain Assessment   Currently in Pain?  No/denies    Multiple Pain Sites  No          Social History   Tobacco Use  Smoking Status Former Smoker  . Packs/day: 1.00  . Years: 33.00  . Pack years: 33.00  . Types: Cigarettes  . Last attempt to quit: 04/06/2000  . Years since quitting: 18.5  Smokeless Tobacco Never Used  Tobacco Comment   use to chew cigars    Goals Met:  Proper associated with RPD/PD & O2 Sat Independence with exercise equipment Exercise tolerated well No report of cardiac concerns or symptoms Strength training completed today  Goals Unmet:  Not Applicable  Comments: Pt able to follow exercise prescription today without complaint.  Will continue to monitor for progression.    Dr. Emily Filbert is Medical Director for [place] and [facility].

## 2018-10-17 ENCOUNTER — Encounter: Payer: No Typology Code available for payment source | Admitting: *Deleted

## 2018-10-17 ENCOUNTER — Ambulatory Visit: Payer: Medicare Other | Admitting: Orthotics

## 2018-10-17 DIAGNOSIS — J449 Chronic obstructive pulmonary disease, unspecified: Secondary | ICD-10-CM

## 2018-10-17 DIAGNOSIS — Z96642 Presence of left artificial hip joint: Secondary | ICD-10-CM

## 2018-10-17 DIAGNOSIS — E1142 Type 2 diabetes mellitus with diabetic polyneuropathy: Secondary | ICD-10-CM

## 2018-10-17 DIAGNOSIS — M1612 Unilateral primary osteoarthritis, left hip: Secondary | ICD-10-CM

## 2018-10-17 NOTE — Progress Notes (Signed)
Daily Session Note  Patient Details  Name: Acie Custis MRN: [id number] Date of Birth: 26-Jul-1951 Referring Provider:     Pulmonary Rehab from 08/07/2018 in [facility] Cardiac and Pulmonary Rehab  Referring Provider  Suzie Portela MD [[facility]]      Encounter Date: 10/17/2018  Check In: Session Check In - 10/17/18 1017      Check-In   Supervising physician immediately available to respond to emergencies  [facility] immediately available ER MD    Physician(s)  Drs. Malinda and [facility]    Location  [facility]-Cardiac & Pulmonary Rehab    Staff Present  Alberteen Sam, MA, RCEP, CCRP, Exercise Physiologist;Joseph [employer], [place], ACSM CEP, Exercise Physiologist    Medication changes reported      No    Fall or balance concerns reported     No    Warm-up and Cool-down  Performed as group-led instruction    Resistance Training Performed  Yes    VAD Patient?  No    PAD/SET Patient?  No      Pain Assessment   Currently in Pain?  No/denies          Social History   Tobacco Use  Smoking Status Former Smoker  . Packs/day: 1.00  . Years: 33.00  . Pack years: 33.00  . Types: Cigarettes  . Last attempt to quit: 04/06/2000  . Years since quitting: 18.5  Smokeless Tobacco Never Used  Tobacco Comment   use to chew cigars    Goals Met:  Proper associated with RPD/PD & O2 Sat Independence with exercise equipment Using PLB without cueing & demonstrates good technique Exercise tolerated well No report of cardiac concerns or symptoms Strength training completed today  Goals Unmet:  Not Applicable  Comments: Pt able to follow exercise prescription today without complaint.  Will continue to monitor for progression.    Dr. Emily Filbert is Medical Director for [place] and [facility].

## 2018-10-17 NOTE — Progress Notes (Signed)

## 2018-10-19 DIAGNOSIS — J449 Chronic obstructive pulmonary disease, unspecified: Secondary | ICD-10-CM

## 2018-10-19 NOTE — Progress Notes (Signed)
Daily Session Note  Patient Details  Name: Ricky Johnston MRN: [id number] Date of Birth: 1951/05/22 Referring Provider:     Pulmonary Rehab from 08/07/2018 in [facility] Cardiac and Pulmonary Rehab  Referring Provider  Suzie Portela MD [[facility]]      Encounter Date: 10/19/2018  Check In: Session Check In - 10/19/18 1016      Check-In   Supervising physician immediately available to respond to emergencies  [facility] immediately available ER MD    Physician(s)  Dr. Jimmye Norman and Quentin Cornwall    Location  [facility]-Cardiac & Pulmonary Rehab    Staff Present  Justin Mend RCP,RRT,BSRT;Meredith Sherryll Burger, RN Vickki Hearing, BA, ACSM CEP, Exercise Physiologist    Medication changes reported      No    Fall or balance concerns reported     No    Warm-up and Cool-down  Performed as group-led instruction    Resistance Training Performed  Yes    VAD Patient?  No    PAD/SET Patient?  No      Pain Assessment   Currently in Pain?  No/denies          Social History   Tobacco Use  Smoking Status Former Smoker  . Packs/day: 1.00  . Years: 33.00  . Pack years: 33.00  . Types: Cigarettes  . Last attempt to quit: 04/06/2000  . Years since quitting: 18.5  Smokeless Tobacco Never Used  Tobacco Comment   use to chew cigars    Goals Met:  Independence with exercise equipment Exercise tolerated well No report of cardiac concerns or symptoms Strength training completed today  Goals Unmet:  Not Applicable  Comments: Pt able to follow exercise prescription today without complaint.  Will continue to monitor for progression.    Dr. Emily Filbert is Medical Director for [place] and [facility].

## 2018-10-22 DIAGNOSIS — J449 Chronic obstructive pulmonary disease, unspecified: Secondary | ICD-10-CM | POA: Diagnosis not present

## 2018-10-22 NOTE — Progress Notes (Signed)
Daily Session Note  Patient Details  Name: Ricky Johnston MRN: [id number] Date of Birth: [birth year] Referring Provider:     Pulmonary Rehab from 08/07/2018 in [facility] Cardiac and Pulmonary Rehab  Referring Provider  Suzie Portela MD [[facility]]      Encounter Date: 10/22/2018  Check In: Session Check In - 10/22/18 0948      Check-In   Supervising physician immediately available to respond to emergencies  [facility] immediately available ER MD    Physician(s)  Dr. Joni Fears and Jimmye Norman    Location  [facility]-Cardiac & Pulmonary Rehab    Staff Present  Justin Mend RCP,RRT,BSRT;Amanda Oletta Darter, [place], ACSM CEP, Exercise Physiologist;Kelly Amedeo Plenty, BS, ACSM CEP, Exercise Physiologist    Medication changes reported      No    Fall or balance concerns reported     No    Warm-up and Cool-down  Performed as group-led instruction    Resistance Training Performed  Yes    VAD Patient?  No    PAD/SET Patient?  No      Pain Assessment   Currently in Pain?  No/denies          Social History   Tobacco Use  Smoking Status Former Smoker  . Packs/day: 1.00  . Years: 33.00  . Pack years: 33.00  . Types: Cigarettes  . Last attempt to quit: 04/06/2000  . Years since quitting: 18.5  Smokeless Tobacco Never Used  Tobacco Comment   use to chew cigars    Goals Met:  Independence with exercise equipment Exercise tolerated well No report of cardiac concerns or symptoms Strength training completed today  Goals Unmet:  Not Applicable  Comments: Pt able to follow exercise prescription today without complaint.  Will continue to monitor for progression.    Dr. Emily Filbert is Medical Director for [place] and [facility].

## 2018-10-29 ENCOUNTER — Encounter: Payer: No Typology Code available for payment source | Attending: *Deleted

## 2018-10-29 DIAGNOSIS — G4730 Sleep apnea, unspecified: Secondary | ICD-10-CM | POA: Insufficient documentation

## 2018-10-29 DIAGNOSIS — Z7951 Long term (current) use of inhaled steroids: Secondary | ICD-10-CM | POA: Insufficient documentation

## 2018-10-29 DIAGNOSIS — Z7982 Long term (current) use of aspirin: Secondary | ICD-10-CM | POA: Insufficient documentation

## 2018-10-29 DIAGNOSIS — Z794 Long term (current) use of insulin: Secondary | ICD-10-CM | POA: Insufficient documentation

## 2018-10-29 DIAGNOSIS — E7800 Pure hypercholesterolemia, unspecified: Secondary | ICD-10-CM | POA: Insufficient documentation

## 2018-10-29 DIAGNOSIS — G2581 Restless legs syndrome: Secondary | ICD-10-CM | POA: Insufficient documentation

## 2018-10-29 DIAGNOSIS — H9190 Unspecified hearing loss, unspecified ear: Secondary | ICD-10-CM | POA: Insufficient documentation

## 2018-10-29 DIAGNOSIS — Z86718 Personal history of other venous thrombosis and embolism: Secondary | ICD-10-CM | POA: Insufficient documentation

## 2018-10-29 DIAGNOSIS — J449 Chronic obstructive pulmonary disease, unspecified: Secondary | ICD-10-CM | POA: Insufficient documentation

## 2018-10-29 DIAGNOSIS — Z8551 Personal history of malignant neoplasm of bladder: Secondary | ICD-10-CM | POA: Insufficient documentation

## 2018-10-29 DIAGNOSIS — I10 Essential (primary) hypertension: Secondary | ICD-10-CM | POA: Insufficient documentation

## 2018-10-29 DIAGNOSIS — M1990 Unspecified osteoarthritis, unspecified site: Secondary | ICD-10-CM | POA: Insufficient documentation

## 2018-10-29 DIAGNOSIS — Z87891 Personal history of nicotine dependence: Secondary | ICD-10-CM | POA: Insufficient documentation

## 2018-10-29 DIAGNOSIS — K219 Gastro-esophageal reflux disease without esophagitis: Secondary | ICD-10-CM | POA: Insufficient documentation

## 2018-10-29 DIAGNOSIS — E119 Type 2 diabetes mellitus without complications: Secondary | ICD-10-CM | POA: Insufficient documentation

## 2018-10-29 DIAGNOSIS — Z79899 Other long term (current) drug therapy: Secondary | ICD-10-CM | POA: Insufficient documentation

## 2018-10-29 DIAGNOSIS — Z87442 Personal history of urinary calculi: Secondary | ICD-10-CM | POA: Insufficient documentation

## 2018-10-29 DIAGNOSIS — J8410 Pulmonary fibrosis, unspecified: Secondary | ICD-10-CM | POA: Insufficient documentation

## 2018-11-07 DIAGNOSIS — Z7982 Long term (current) use of aspirin: Secondary | ICD-10-CM | POA: Diagnosis not present

## 2018-11-07 DIAGNOSIS — E7800 Pure hypercholesterolemia, unspecified: Secondary | ICD-10-CM | POA: Diagnosis not present

## 2018-11-07 DIAGNOSIS — Z87891 Personal history of nicotine dependence: Secondary | ICD-10-CM | POA: Diagnosis not present

## 2018-11-07 DIAGNOSIS — Z87442 Personal history of urinary calculi: Secondary | ICD-10-CM | POA: Diagnosis not present

## 2018-11-07 DIAGNOSIS — G4730 Sleep apnea, unspecified: Secondary | ICD-10-CM | POA: Diagnosis not present

## 2018-11-07 DIAGNOSIS — Z8551 Personal history of malignant neoplasm of bladder: Secondary | ICD-10-CM | POA: Diagnosis not present

## 2018-11-07 DIAGNOSIS — J8410 Pulmonary fibrosis, unspecified: Secondary | ICD-10-CM | POA: Diagnosis not present

## 2018-11-07 DIAGNOSIS — Z794 Long term (current) use of insulin: Secondary | ICD-10-CM | POA: Diagnosis not present

## 2018-11-07 DIAGNOSIS — M1990 Unspecified osteoarthritis, unspecified site: Secondary | ICD-10-CM | POA: Diagnosis not present

## 2018-11-07 DIAGNOSIS — Z7951 Long term (current) use of inhaled steroids: Secondary | ICD-10-CM | POA: Diagnosis not present

## 2018-11-07 DIAGNOSIS — G2581 Restless legs syndrome: Secondary | ICD-10-CM | POA: Diagnosis not present

## 2018-11-07 DIAGNOSIS — J449 Chronic obstructive pulmonary disease, unspecified: Secondary | ICD-10-CM

## 2018-11-07 DIAGNOSIS — E119 Type 2 diabetes mellitus without complications: Secondary | ICD-10-CM | POA: Diagnosis not present

## 2018-11-07 DIAGNOSIS — I10 Essential (primary) hypertension: Secondary | ICD-10-CM | POA: Diagnosis not present

## 2018-11-07 DIAGNOSIS — K219 Gastro-esophageal reflux disease without esophagitis: Secondary | ICD-10-CM | POA: Diagnosis not present

## 2018-11-07 DIAGNOSIS — H9190 Unspecified hearing loss, unspecified ear: Secondary | ICD-10-CM | POA: Diagnosis not present

## 2018-11-07 DIAGNOSIS — Z86718 Personal history of other venous thrombosis and embolism: Secondary | ICD-10-CM | POA: Diagnosis not present

## 2018-11-07 DIAGNOSIS — Z79899 Other long term (current) drug therapy: Secondary | ICD-10-CM | POA: Diagnosis not present

## 2018-11-07 NOTE — Progress Notes (Signed)
Daily Session Note  Patient Details  Name: Rakim Moone MRN: [id number] Date of Birth: December 19, 1950 Referring Provider:     Pulmonary Rehab from 08/07/2018 in [facility] Cardiac and Pulmonary Rehab  Referring Provider  Suzie Portela MD [[facility]]      Encounter Date: 11/07/2018  Check In: Session Check In - 11/07/18 1144      Check-In   Supervising physician immediately available to respond to emergencies  [facility] immediately available ER MD    Physician(s)  Drs. Alyse Low    Location  [facility]-Cardiac & Pulmonary Rehab    Staff Present  Alberteen Sam, MA, RCEP, CCRP, Exercise Physiologist;Joseph Foy [place], [place], ACSM CEP, Exercise Physiologist    Medication changes reported      No    Fall or balance concerns reported     No    Warm-up and Cool-down  Performed as group-led instruction    Resistance Training Performed  Yes    VAD Patient?  No    PAD/SET Patient?  No      Pain Assessment   Currently in Pain?  No/denies          Social History   Tobacco Use  Smoking Status Former Smoker  . Packs/day: 1.00  . Years: 33.00  . Pack years: 33.00  . Types: Cigarettes  . Last attempt to quit: 04/06/2000  . Years since quitting: 18.6  Smokeless Tobacco Never Used  Tobacco Comment   use to chew cigars    Goals Met:  Proper associated with RPD/PD & O2 Sat Independence with exercise equipment Using PLB without cueing & demonstrates good technique Exercise tolerated well No report of cardiac concerns or symptoms Strength training completed today  Goals Unmet:  Not Applicable  Comments: Pt able to follow exercise prescription today without complaint.  Will continue to monitor for progression.    Dr. Emily Filbert is Medical Director for [place] and [facility].

## 2018-11-09 ENCOUNTER — Telehealth: Payer: Self-pay | Admitting: Orthotics

## 2018-11-09 NOTE — Telephone Encounter (Signed)
Called patient and left message that he needs to go to [place] to get DBS/inserts as he doesn't have proper qualifying conditions noted to get through [facility].  Told him that we would provide [facility]/all [facility].

## 2018-11-12 ENCOUNTER — Encounter: Payer: No Typology Code available for payment source | Admitting: *Deleted

## 2018-11-12 DIAGNOSIS — J449 Chronic obstructive pulmonary disease, unspecified: Secondary | ICD-10-CM

## 2018-11-12 NOTE — Progress Notes (Signed)
Daily Session Note  Patient Details  Name: Ricky Johnston MRN: [id number] Date of Birth: [birth year] Referring Provider:     Pulmonary Rehab from 08/07/2018 in [facility] Cardiac and Pulmonary Rehab  Referring Provider  Suzie Portela MD [[facility]]      Encounter Date: 11/12/2018  Check In: Session Check In - 11/12/18 1008      Check-In   Supervising physician immediately available to respond to emergencies  [facility] immediately available ER MD    Physician(s)  Dr. Joni Fears and Dr. Alfred Levins    Location  [facility]-Cardiac & Pulmonary Rehab    Staff Present  Earlean Shawl, BS, ACSM CEP, Exercise Physiologist;Joseph [facility], [place], ACSM CEP, Exercise Physiologist    Medication changes reported      No    Fall or balance concerns reported     No    Tobacco Cessation  No Change    Warm-up and Cool-down  Performed as group-led instruction    Resistance Training Performed  Yes    VAD Patient?  No    PAD/SET Patient?  No      Pain Assessment   Currently in Pain?  No/denies    Multiple Pain Sites  No          Social History   Tobacco Use  Smoking Status Former Smoker  . Packs/day: 1.00  . Years: 33.00  . Pack years: 33.00  . Types: Cigarettes  . Last attempt to quit: 04/06/2000  . Years since quitting: 18.6  Smokeless Tobacco Never Used  Tobacco Comment   use to chew cigars    Goals Met:  Proper associated with RPD/PD & O2 Sat Independence with exercise equipment Exercise tolerated well No report of cardiac concerns or symptoms Strength training completed today  Goals Unmet:  Not Applicable  Comments: Pt able to follow exercise prescription today without complaint.  Will continue to monitor for progression.    Dr. Emily Filbert is Medical Director for [place] and [facility].

## 2018-11-12 NOTE — Progress Notes (Signed)
Pulmonary Individual Treatment Plan  Patient Details  Name: Ricky Johnston MRN: [id number] Date of Birth: [birth year] Referring Provider:     Pulmonary Rehab from 08/07/2018 in [facility] Cardiac and Pulmonary Rehab  Referring Provider  Suzie Portela MD [[facility]]      Initial Encounter Date:    Pulmonary Rehab from 08/07/2018 in [facility] Cardiac and Pulmonary Rehab  Date  08/07/18      Visit Diagnosis: Chronic obstructive pulmonary disease, unspecified COPD type ([place])  Patient's Home Medications on Admission:  Current Outpatient Medications:  .  acetaminophen (TYLENOL) 325 MG tablet, Take 3 tablets (975 mg total) by mouth every 8 (eight) hours as needed for mild pain. (Patient taking differently: Take 975 mg by mouth 2 (two) times daily as needed for mild pain. ), Disp: , Rfl:  .  albuterol (PROVENTIL HFA;VENTOLIN HFA) 108 (90 Base) MCG/ACT inhaler, Inhale 2 puffs into the lungs every 6 (six) hours as needed for wheezing or shortness of breath., Disp: , Rfl:  .  ammonium lactate (LAC-HYDRIN) 12 % lotion, Apply 1 application topically daily as needed for dry skin., Disp: , Rfl:  .  aspirin 81 MG chewable tablet, Chew 1 tablet (81 mg total) by mouth 2 (two) times daily after a meal., Disp: 60 tablet, Rfl: 0 .  atorvastatin (LIPITOR) 80 MG tablet, Take 80 mg by mouth at bedtime., Disp: , Rfl:  .  budesonide-formoterol (SYMBICORT) 160-4.5 MCG/ACT inhaler, Inhale 2 puffs into the lungs 2 (two) times daily., Disp: , Rfl:  .  cetirizine (ZYRTEC) 10 MG tablet, Take 10 mg by mouth daily as needed for allergies., Disp: , Rfl:  .  cholecalciferol (VITAMIN D) 1000 UNITS tablet, Take 1,000 Units by mouth daily. , Disp: , Rfl:  .  dextrose (GLUTOSE) 40 % GEL, Take by mouth., Disp: , Rfl:  .  ferrous sulfate 325 (65 FE) MG tablet, Take 325 mg by mouth daily. , Disp: , Rfl:  .  fluticasone (FLONASE) 50 MCG/ACT nasal spray, , Disp: , Rfl:  .  furosemide (LASIX) 20 MG tablet, Take 40 mg by mouth daily. , Disp: , Rfl:   .  gabapentin (NEURONTIN) 300 MG capsule, Take 600 mg by mouth 2 (two) times daily. , Disp: , Rfl:  .  HUMULIN R U-500 KWIKPEN 500 UNIT/ML kwikpen, , Disp: , Rfl:  .  hydrocortisone 2.5 % lotion, Apply topically., Disp: , Rfl:  .  JARDIANCE 10 MG TABS tablet, , Disp: , Rfl:  .  ketoconazole (NIZORAL) 2 % shampoo, Apply topically., Disp: , Rfl:  .  losartan (COZAAR) 25 MG tablet, Take 25 mg by mouth daily., Disp: , Rfl:  .  metFORMIN (GLUCOPHAGE) 1000 MG tablet, Take 1,000 mg by mouth 2 (two) times daily with a meal., Disp: , Rfl:  .  methocarbamol (ROBAXIN) 500 MG tablet, Take 1 tablet (500 mg total) by mouth every 6 (six) hours as needed for muscle spasms., Disp: 60 tablet, Rfl: 0 .  nitrofurantoin, macrocrystal-monohydrate, (MACROBID) 100 MG capsule, , Disp: , Rfl:  .  omeprazole (PRILOSEC) 20 MG capsule, Take 20 mg by mouth 2 (two) times daily before a meal., Disp: , Rfl:  .  OXYGEN, Inhale 1 L into the lungs continuous. At night time may increase to 2 L as needed for shortness of breath, Disp: , Rfl:  .  rOPINIRole (REQUIP) 0.5 MG tablet, Take 0.5 mg by mouth at bedtime., Disp: , Rfl:  .  selenium sulfide (SELSUN) 2.5 % shampoo, Apply topically., Disp: ,  Rfl:  .  senna-docusate (SENOKOT-S) 8.6-50 MG tablet, Take by mouth., Disp: , Rfl:  .  sodium chloride (ALTAMIST SPRAY) 0.65 % nasal spray, Place into the nose., Disp: , Rfl:  .  SUMAtriptan 6 MG/0.5ML SOAJ, Inject into the skin., Disp: , Rfl:  .  SUPER B COMPLEX/C PO, Take 1 tablet by mouth 2 (two) times daily., Disp: , Rfl:  .  tamsulosin (FLOMAX) 0.4 MG CAPS capsule, Take 0.4 mg by mouth daily., Disp: , Rfl:  .  tiotropium (SPIRIVA HANDIHALER) 18 MCG inhalation capsule, Place 18 mcg into inhaler and inhale daily., Disp: , Rfl:   Past Medical History: Past Medical History:  Diagnosis Date  . Arthritis   . Cancer [facility])    bladder cancer currently 2016  . Complication of anesthesia    hard for him to wake up from Anesthesia from left  nephrectomy  . COPD (chronic obstructive pulmonary disease) ([place])   . Diabetes mellitus without complication ([place])   . DVT (deep venous thrombosis) ([place]) 01/08/2014, 2010   upper extremity  . GERD (gastroesophageal reflux disease)   . Hallux limitus 05/18/2015   from notes from [place]   . Headache    migraines  . History of kidney stones   . Hypercholesterolemia   . Hypertension   . Impaired hearing   . Intervertebral disc syndrome   . Kidney stones   . Pulmonary fibrosis ([place])   . Renal calculi 01/08/2014   frrom noted from [facility] .in chart  . Restless legs 01/08/2014  . Sciatic leg pain    paralysis of sciatic nerve  . Sleep apnea    CPAP/BIPAP  . Tinnitus     Tobacco Use: Social History   Tobacco Use  Smoking Status Former Smoker  . Packs/day: 1.00  . Years: 33.00  . Pack years: 33.00  . Types: Cigarettes  . Last attempt to quit: 04/06/2000  . Years since quitting: 18.6  Smokeless Tobacco Never Used  Tobacco Comment   use to chew cigars    Labs: Recent Review Flowsheet Data    Labs for ITP Cardiac and Pulmonary Rehab Latest Ref Rng & Units 01/08/2015 02/10/2017 02/10/2017 02/11/2017   Hemoglobin A1c 4.8 - 5.6 % 8.1(H) - - -   PHART 7.350 - 7.450 - 7.181(LL) 7.217(L) 7.298(L)   PCO2ART 32.0 - 48.0 mmHg - 82.8(HH) 73.1(HH) 62.5(H)   HCO3 20.0 - 28.0 mmol/L - 29.8(H) 28.6(H) 29.4(H)   ACIDBASEDEF 0.0 - 2.0 mmol/L - 0.7 0.8 -   O2SAT % - 98.5 93.5 96.1       Pulmonary Assessment Scores: Pulmonary Assessment Scores    Row Name 08/07/18 1558         ADL UCSD   ADL Phase  Entry     SOB Score total  75     Rest  3     Walk  5     Stairs  5     Bath  4     Dress  4     Shop  0       CAT Score   CAT Score  34       mMRC Score   mMRC Score  4        Pulmonary Function Assessment: Pulmonary Function Assessment - 08/07/18 1523      Initial Spirometry Results   FVC%  31 %    FEV1%  35 %    FEV1/FVC Ratio  85.68    Comments  good patient  effort      Post Bronchodilator Spirometry Results   FVC%  32.24 %    FEV1%  37.8 %    FEV1/FVC Ratio  89.05    Comments  good patient effort       Breath   Bilateral Breath Sounds  Clear;Decreased    Shortness of Breath  Limiting activity;Yes;Fear of Shortness of Breath       Exercise Target Goals: Exercise Program Goal: Individual exercise prescription set using results from initial 6 min walk test and THRR while considering  patient's activity barriers and safety.   Exercise Prescription Goal: Initial exercise prescription builds to 30-45 minutes a day of aerobic activity, 2-3 days per week.  Home exercise guidelines will be given to patient during program as part of exercise prescription that the participant will acknowledge.  Activity Barriers & Risk Stratification: Activity Barriers & Cardiac Risk Stratification - 08/07/18 1621      Activity Barriers & Cardiac Risk Stratification   Activity Barriers  Shortness of Breath;Balance Concerns;History of Falls;Left Hip Replacement;Assistive Device;Muscular Weakness;Deconditioning;Back Problems;Joint Problems   DJD, chronic knee pain      6 Minute Walk: 6 Minute Walk    Row Name 08/07/18 1618         6 Minute Walk   Phase  Initial     Distance  555 feet     Walk Time  4.9 minutes     # of Rest Breaks  1 1:06     MPH  1.29     METS  1.07     RPE  15     Perceived Dyspnea   4     VO2 Peak  3.75     Symptoms  Yes (comment)     Comments  SOB, using rollator for support      Resting HR  79 bpm     Resting BP  146/64     Resting Oxygen Saturation   90 %     Exercise Oxygen Saturation  during 6 min walk  87 %     Max Ex. HR  124 bpm     Max Ex. BP  156/64     2 Minute Post BP  150/56       Interval HR   1 Minute HR  110     2 Minute HR  117     3 Minute HR  121     4 Minute HR  106     5 Minute HR  112     6 Minute HR  124     2 Minute Post HR  100     Interval Heart Rate?  Yes       Interval Oxygen    Interval Oxygen?  Yes     Baseline Oxygen Saturation %  90 %     1 Minute Oxygen Saturation %  89 %     1 Minute Liters of Oxygen  8 L continuous     2 Minute Oxygen Saturation %  91 %     2 Minute Liters of Oxygen  8 L     3 Minute Oxygen Saturation %  89 % rest break 2:51-4:07     3 Minute Liters of Oxygen  8 L     4 Minute Oxygen Saturation %  92 %     4 Minute Liters of Oxygen  8 L     5 Minute Oxygen Saturation %  95 %     5 Minute Liters of Oxygen  8 L     6 Minute Oxygen Saturation %  89 % 87% immediately post     6 Minute Liters of Oxygen  8 L     2 Minute Post Oxygen Saturation %  96 %     2 Minute Post Liters of Oxygen  8 L       Oxygen Initial Assessment: Oxygen Initial Assessment - 08/07/18 1520      Home Oxygen   Home Oxygen Device  Home Concentrator;E-Tanks    Sleep Oxygen Prescription  BiPAP;Continuous    Liters per minute  3    Home Exercise Oxygen Prescription  Continuous    Liters per minute  3    Home at Rest Exercise Oxygen Prescription  Continuous    Liters per minute  3    Compliance with Home Oxygen Use  Yes      Initial 6 min Walk   Oxygen Used  Continuous;E-Tanks    Liters per minute  3      Program Oxygen Prescription   Program Oxygen Prescription  E-Tanks;Continuous    Liters per minute  8      Intervention   Short Term Goals  To learn and exhibit compliance with exercise, home and travel O2 prescription;To learn and understand importance of monitoring SPO2 with pulse oximeter and demonstrate accurate use of the pulse oximeter.;To learn and understand importance of maintaining oxygen saturations>88%;To learn and demonstrate proper pursed lip breathing techniques or other breathing techniques.;To learn and demonstrate proper use of respiratory medications    Long  Term Goals  Exhibits compliance with exercise, home and travel O2 prescription;Verbalizes importance of monitoring SPO2 with pulse oximeter and return demonstration;Maintenance of O2  saturations>88%;Exhibits proper breathing techniques, such as pursed lip breathing or other method taught during program session;Compliance with respiratory medication;Demonstrates proper use of MDI's       Oxygen Re-Evaluation: Oxygen Re-Evaluation    Row Name 08/17/18 1023 09/10/18 1023 09/24/18 1025 11/07/18 1417       Program Oxygen Prescription   Program Oxygen Prescription  -  E-Tanks;Continuous  E-Tanks;Continuous  E-Tanks;Continuous    Liters per minute  -  '4  4  4    '$ Comments  -  patient has been doing very well on 4 liters of oxygen while exercising.  -  -      Home Oxygen   Home Oxygen Device  -  Home Concentrator;E-Tanks  Home Concentrator;E-Tanks  Home Concentrator;E-Tanks    Sleep Oxygen Prescription  -  BiPAP;Continuous  BiPAP;Continuous  BiPAP;Continuous    Liters per minute  -  '3  3  3    '$ Home Exercise Oxygen Prescription  -  Continuous  Continuous  Continuous    Liters per minute  -  '3  3  3    '$ Home at Rest Exercise Oxygen Prescription  -  Continuous  Continuous  Continuous    Liters per minute  -  '3  3  3    '$ Compliance with Home Oxygen Use  -  Yes  Yes  Yes      Goals/Expected Outcomes   Short Term Goals  To learn and understand importance of monitoring SPO2 with pulse oximeter and demonstrate accurate use of the pulse oximeter.;To learn and understand importance of maintaining oxygen saturations>88%;To learn and demonstrate proper pursed lip breathing techniques or other breathing techniques.  To learn and exhibit compliance with exercise, home and  travel O2 prescription;To learn and understand importance of monitoring SPO2 with pulse oximeter and demonstrate accurate use of the pulse oximeter.;To learn and understand importance of maintaining oxygen saturations>88%;To learn and demonstrate proper pursed lip breathing techniques or other breathing techniques.;To learn and demonstrate proper use of respiratory medications  To learn and exhibit compliance with exercise,  home and travel O2 prescription;To learn and understand importance of monitoring SPO2 with pulse oximeter and demonstrate accurate use of the pulse oximeter.;To learn and understand importance of maintaining oxygen saturations>88%;To learn and demonstrate proper pursed lip breathing techniques or other breathing techniques.;To learn and demonstrate proper use of respiratory medications  To learn and exhibit compliance with exercise, home and travel O2 prescription;To learn and understand importance of monitoring SPO2 with pulse oximeter and demonstrate accurate use of the pulse oximeter.;To learn and understand importance of maintaining oxygen saturations>88%;To learn and demonstrate proper pursed lip breathing techniques or other breathing techniques.;To learn and demonstrate proper use of respiratory medications    Long  Term Goals  Verbalizes importance of monitoring SPO2 with pulse oximeter and return demonstration;Maintenance of O2 saturations>88%;Exhibits proper breathing techniques, such as pursed lip breathing or other method taught during program session  Exhibits compliance with exercise, home and travel O2 prescription;Verbalizes importance of monitoring SPO2 with pulse oximeter and return demonstration;Maintenance of O2 saturations>88%;Exhibits proper breathing techniques, such as pursed lip breathing or other method taught during program session;Compliance with respiratory medication;Demonstrates proper use of MDI's  Exhibits compliance with exercise, home and travel O2 prescription;Verbalizes importance of monitoring SPO2 with pulse oximeter and return demonstration;Maintenance of O2 saturations>88%;Exhibits proper breathing techniques, such as pursed lip breathing or other method taught during program session;Compliance with respiratory medication;Demonstrates proper use of MDI's  Exhibits compliance with exercise, home and travel O2 prescription;Verbalizes importance of monitoring SPO2 with  pulse oximeter and return demonstration;Maintenance of O2 saturations>88%;Exhibits proper breathing techniques, such as pursed lip breathing or other method taught during program session;Compliance with respiratory medication;Demonstrates proper use of MDI's    Comments  Reviewed PLB technique with pt.  Talked about how it work and it's important to maintaining his exercise saturations.    Patient is doing well with PLB on exertion. Ricky Johnston is able to walk further distances now since the start of the program. He is doing well on 4 liters of oxygen instead of 8 liters for exercise. His oxygen is is above 88 percent when exercising.  Patient is checking his oxygen at home routinely. He knows he needs to be 88 percent and above. He is taking his breathing medications properly. He wants to try to get off oxygen completly.  Patient states that he is doing well with his oxygen at home. He has liquid oxygen now at home and the company and his doctor have told him he is supposed to use more. He has had to use 6 liters of oxygen when on the treadmill but is able to use 4 liters when he is on sit down equipment with no desaturation issues. Patient states that he is supposed to use 12 liters of oxygen when he is at home. Patients oxygen does very well when he is on 4 to 6 liters when exercising.    Goals/Expected Outcomes  Short: Become more profiecient at using PLB.   Long: Become independent at using PLB.  Short: check oxygen at home more on exertion. Long: reduce oxygen below 4 liters for exercise.  Short: move down to 3 liters for sit down equipment. Long: get off oxygen  for exercise.  Short: ask his doctor why he should be on 12 liters. Long: keep oxygen saturations above 88 percent with less oxygen.       Oxygen Discharge (Final Oxygen Re-Evaluation): Oxygen Re-Evaluation - 11/07/18 1417      Program Oxygen Prescription   Program Oxygen Prescription  E-Tanks;Continuous    Liters per minute  4      Home Oxygen    Home Oxygen Device  Home Concentrator;E-Tanks    Sleep Oxygen Prescription  BiPAP;Continuous    Liters per minute  3    Home Exercise Oxygen Prescription  Continuous    Liters per minute  3    Home at Rest Exercise Oxygen Prescription  Continuous    Liters per minute  3    Compliance with Home Oxygen Use  Yes      Goals/Expected Outcomes   Short Term Goals  To learn and exhibit compliance with exercise, home and travel O2 prescription;To learn and understand importance of monitoring SPO2 with pulse oximeter and demonstrate accurate use of the pulse oximeter.;To learn and understand importance of maintaining oxygen saturations>88%;To learn and demonstrate proper pursed lip breathing techniques or other breathing techniques.;To learn and demonstrate proper use of respiratory medications    Long  Term Goals  Exhibits compliance with exercise, home and travel O2 prescription;Verbalizes importance of monitoring SPO2 with pulse oximeter and return demonstration;Maintenance of O2 saturations>88%;Exhibits proper breathing techniques, such as pursed lip breathing or other method taught during program session;Compliance with respiratory medication;Demonstrates proper use of MDI's    Comments  Patient states that he is doing well with his oxygen at home. He has liquid oxygen now at home and the company and his doctor have told him he is supposed to use more. He has had to use 6 liters of oxygen when on the treadmill but is able to use 4 liters when he is on sit down equipment with no desaturation issues. Patient states that he is supposed to use 12 liters of oxygen when he is at home. Patients oxygen does very well when he is on 4 to 6 liters when exercising.    Goals/Expected Outcomes  Short: ask his doctor why he should be on 12 liters. Long: keep oxygen saturations above 88 percent with less oxygen.       Initial Exercise Prescription: Initial Exercise Prescription - 08/07/18 1600      Date of  Initial Exercise RX and Referring Provider   Date  08/07/18    Referring Provider  Suzie Portela MD   [facility]     Oxygen   Oxygen  Continuous    Liters  8-10      Treadmill   MPH  1    Grade  0    Minutes  15    METs  1.77      NuStep   Level  1    SPM  80    Minutes  15    METs  1.7      Biostep-RELP   Level  1    SPM  60    Minutes  15    METs  2      Prescription Details   Frequency (times per week)  3    Duration  Progress to 45 minutes of aerobic exercise without signs/symptoms of physical distress      Intensity   THRR 40-80% of Max Heartrate  109-138    Ratings of Perceived Exertion  11-13  Perceived Dyspnea  0-4      Progression   Progression  Continue to progress workloads to maintain intensity without signs/symptoms of physical distress.      Resistance Training   Training Prescription  Yes    Weight  4 lbs    Reps  10-15       Perform Capillary Blood Glucose checks as needed.  Exercise Prescription Changes: Exercise Prescription Changes    Row Name 08/07/18 1600 08/22/18 1200 09/05/18 1100 09/19/18 1200 09/24/18 1000     Response to Exercise   Blood Pressure (Admit)  146/64  124/64  134/68  142/68  -   Blood Pressure (Exercise)  156/64  142/64  -  -  -   Blood Pressure (Exit)  150/56  126/68  128/60  130/82  -   Heart Rate (Admit)  79 bpm  104 bpm  110 bpm  94 bpm  -   Heart Rate (Exercise)  124 bpm  115 bpm  114 bpm  128 bpm  -   Heart Rate (Exit)  94 bpm  101 bpm  110 bpm  116 bpm  -   Oxygen Saturation (Admit)  90 %  94 %  89 %  94 %  -   Oxygen Saturation (Exercise)  87 %  95 %  88 %  86 %  -   Oxygen Saturation (Exit)  97 %  92 %  88 %  94 %  -   Rating of Perceived Exertion (Exercise)  '15  13  13  13  '$ -   Perceived Dyspnea (Exercise)  4  0  1  1  -   Symptoms  SOB  -  -  -  -   Comments  walk test results  -  -  -  -   Duration  -  Progress to 45 minutes of aerobic exercise without signs/symptoms of physical distress  Progress to 45  minutes of aerobic exercise without signs/symptoms of physical distress  Progress to 45 minutes of aerobic exercise without signs/symptoms of physical distress  Progress to 45 minutes of aerobic exercise without signs/symptoms of physical distress   Intensity  -  THRR unchanged  THRR unchanged  THRR unchanged  THRR unchanged     Progression   Progression  -  Continue to progress workloads to maintain intensity without signs/symptoms of physical distress.  Continue to progress workloads to maintain intensity without signs/symptoms of physical distress.  Continue to progress workloads to maintain intensity without signs/symptoms of physical distress.  Continue to progress workloads to maintain intensity without signs/symptoms of physical distress.   Average METs  -  1.76  1.8  1.9  1.9     Resistance Training   Training Prescription  -  Yes  Yes  Yes  Yes   Weight  -  4 lb  4 lb  4 lb  4 lb   Reps  -  10-15  10-15  10-15  10-15     Interval Training   Interval Training  -  -  No  -  -     Treadmill   MPH  -  -  1  -  -   Grade  -  -  0  -  -   Minutes  -  -  15  -  -   METs  -  -  1.77  -  -     NuStep   Level  -  -  $'1  1  1   'z$ SPM  -  -  80  80  80   Minutes  -  -  '15  15  15   '$ METs  -  -  1.'8  2  2     '$ Biostep-RELP   Level  -  -  -  3  3   SPM  -  -  -  50  50   Minutes  -  -  -  15  15   METs  -  -  -  2  2     Home Exercise Plan   Plans to continue exercise at  -  -  -  -  Home (comment) walking at home   Frequency  -  -  -  -  Add 2 additional days to program exercise sessions.   Initial Home Exercises Provided  -  -  -  -  09/24/18   Row Name 10/03/18 1200 10/17/18 1200 10/31/18 1200         Response to Exercise   Blood Pressure (Admit)  130/64  128/70  130/62     Blood Pressure (Exit)  132/68  126/64  122/70     Heart Rate (Admit)  114 bpm  110 bpm  104 bpm     Heart Rate (Exercise)  113 bpm  137 bpm  125 bpm     Heart Rate (Exit)  64 bpm  111 bpm  93 bpm     Oxygen  Saturation (Admit)  92 %  89 %  89 %     Oxygen Saturation (Exercise)  92 %  86 % inc to 6L - rested til above 90  88 %     Oxygen Saturation (Exit)  96 %  94 %  94 %     Rating of Perceived Exertion (Exercise)  '11  15  13     '$ Perceived Dyspnea (Exercise)  '1  4  2     '$ Duration  Continue with 45 min of aerobic exercise without signs/symptoms of physical distress.  Continue with 45 min of aerobic exercise without signs/symptoms of physical distress.  Continue with 45 min of aerobic exercise without signs/symptoms of physical distress.     Intensity  THRR unchanged  THRR unchanged  THRR unchanged       Progression   Progression  Continue to progress workloads to maintain intensity without signs/symptoms of physical distress.  Continue to progress workloads to maintain intensity without signs/symptoms of physical distress.  Continue to progress workloads to maintain intensity without signs/symptoms of physical distress.     Average METs  1.96  2.4  2       Resistance Training   Training Prescription  Yes  Yes  Yes     Weight  4 lb  4 lb  4 lb     Reps  10-15  10-15  10-15       Interval Training   Interval Training  No  No  No       Treadmill   MPH  -  1  1     Grade  -  0  0     Minutes  -  15  15     METs  -  1.77  1.77       NuStep   Level  4  -  6     SPM  80  -  80  Minutes  15  -  15     METs  2.3  -  2       Biostep-RELP   Level  '10  12  12     '$ SPM  50  50  50     Minutes  '15  15  15     '$ METs  '3  3  2       '$ Home Exercise Plan   Plans to continue exercise at  Home (comment) walking at home  Home (comment) walking at home  Home (comment) walking at home     Frequency  Add 2 additional days to program exercise sessions.  Add 2 additional days to program exercise sessions.  Add 2 additional days to program exercise sessions.     Initial Home Exercises Provided  09/24/18  09/24/18  09/24/18        Exercise Comments: Exercise Comments    Row Name 08/17/18 1021            Exercise Comments   First full day of exercise!  Patient was oriented to gym and equipment including functions, settings, policies, and procedures.  Patient's individual exercise prescription and treatment plan were reviewed.  All starting workloads were established based on the results of the 6 minute walk test done at initial orientation visit.  The plan for exercise progression was also introduced and progression will be customized based on patient's performance and goals          Exercise Goals and Review: Exercise Goals    Row Name 08/07/18 1624             Exercise Goals   Increase Physical Activity  Yes       Intervention  Provide advice, education, support and counseling about physical activity/exercise needs.;Develop an individualized exercise prescription for aerobic and resistive training based on initial evaluation findings, risk stratification, comorbidities and participant's personal goals.       Expected Outcomes  Short Term: Attend rehab on a regular basis to increase amount of physical activity.;Long Term: Add in home exercise to make exercise part of routine and to increase amount of physical activity.;Long Term: Exercising regularly at least 3-5 days a week.       Increase Strength and Stamina  Yes       Intervention  Provide advice, education, support and counseling about physical activity/exercise needs.;Develop an individualized exercise prescription for aerobic and resistive training based on initial evaluation findings, risk stratification, comorbidities and participant's personal goals.       Expected Outcomes  Short Term: Increase workloads from initial exercise prescription for resistance, speed, and METs.;Short Term: Perform resistance training exercises routinely during rehab and add in resistance training at home;Long Term: Improve cardiorespiratory fitness, muscular endurance and strength as measured by increased METs and functional capacity (6MWT)       Able  to understand and use rate of perceived exertion (RPE) scale  Yes       Intervention  Provide education and explanation on how to use RPE scale       Expected Outcomes  Short Term: Able to use RPE daily in rehab to express subjective intensity level;Long Term:  Able to use RPE to guide intensity level when exercising independently       Able to understand and use Dyspnea scale  Yes       Intervention  Provide education and explanation on how to use Dyspnea scale  Expected Outcomes  Short Term: Able to use Dyspnea scale daily in rehab to express subjective sense of shortness of breath during exertion;Long Term: Able to use Dyspnea scale to guide intensity level when exercising independently       Knowledge and understanding of Target Heart Rate Range (THRR)  Yes       Intervention  Provide education and explanation of THRR including how the numbers were predicted and where they are located for reference       Expected Outcomes  Short Term: Able to state/look up THRR;Short Term: Able to use daily as guideline for intensity in rehab;Long Term: Able to use THRR to govern intensity when exercising independently       Able to check pulse independently  Yes       Intervention  Provide education and demonstration on how to check pulse in carotid and radial arteries.;Review the importance of being able to check your own pulse for safety during independent exercise       Expected Outcomes  Short Term: Able to explain why pulse checking is important during independent exercise;Long Term: Able to check pulse independently and accurately       Understanding of Exercise Prescription  Yes       Intervention  Provide education, explanation, and written materials on patient's individual exercise prescription       Expected Outcomes  Short Term: Able to explain program exercise prescription;Long Term: Able to explain home exercise prescription to exercise independently          Exercise Goals Re-Evaluation  : Exercise Goals Re-Evaluation    Row Name 08/17/18 1022 09/05/18 1150 09/19/18 1258 09/24/18 1057 10/03/18 1253     Exercise Goal Re-Evaluation   Exercise Goals Review  Increase Physical Activity;Increase Strength and Stamina;Able to understand and use rate of perceived exertion (RPE) scale;Able to understand and use Dyspnea scale;Able to check pulse independently;Understanding of Exercise Prescription  Increase Physical Activity;Increase Strength and Stamina;Able to understand and use rate of perceived exertion (RPE) scale;Able to understand and use Dyspnea scale  Increase Physical Activity;Increase Strength and Stamina;Able to understand and use rate of perceived exertion (RPE) scale;Able to understand and use Dyspnea scale  Increase Physical Activity;Increase Strength and Stamina;Able to understand and use rate of perceived exertion (RPE) scale;Able to understand and use Dyspnea scale;Knowledge and understanding of Target Heart Rate Range (THRR);Able to check pulse independently;Understanding of Exercise Prescription  Increase Physical Activity;Increase Strength and Stamina;Able to understand and use rate of perceived exertion (RPE) scale;Able to understand and use Dyspnea scale;Understanding of Exercise Prescription   Comments  Reviewed RPE scale, THR and program prescription with pt today.  Pt voiced understanding and was given a copy of goals to take home.   Ricky Johnston has just started LW.  He needs to attend consistently to see progress.  He tolerates exercise well when he attends.  Ricky Johnston last session was 10/14.  Regular attendnace is needed for progression  Reviewed home exercise with pt today.  Pt plans to walk at home for exercise.  Reviewed THR, pulse, RPE, sign and symptoms, NTG use, and when to call 911 or MD.  Also discussed weather considerations and indoor options.  Pt voiced understanding.  Ricky Johnston is up to level 10 on Biostep.  Staff will ask if hed like to change to another machine.  He has been  consistent attending the past 2 weeks.   Expected Outcomes  Short: Use RPE daily to regulate intensity. Long: Follow program prescription in THR.  Short - attend consistently Long - increase MET level  Short - attend consistently Long - increase MET level  Short: Add 1-2 days of walking at home on days Ricky Johnston does not come to rehab. Long: Become independent with exercise program.   Short - change Biostep to more challenging piece Long - increase MET level   Row Name 10/17/18 1225 10/31/18 1235           Exercise Goal Re-Evaluation   Exercise Goals Review  Increase Physical Activity;Increase Strength and Stamina;Able to understand and use rate of perceived exertion (RPE) scale;Able to understand and use Dyspnea scale;Knowledge and understanding of Target Heart Rate Range (THRR)  Increase Physical Activity;Increase Strength and Stamina;Able to understand and use rate of perceived exertion (RPE) scale;Able to understand and use Dyspnea scale;Knowledge and understanding of Target Heart Rate Range (THRR);Understanding of Exercise Prescription      Comments  Ricky Johnston has shown good progress and has improved MET level.  He has attended consistently this month.  Ricky Johnston has increased overall endurance since beginning LW.  He has increased resistance on machines and is able to complete 45 minutes continuously.      Expected Outcomes  Short - work on endurance on TM Long - increase MET level  Short - attend 3 times per week Long - increase MET level         Discharge Exercise Prescription (Final Exercise Prescription Changes): Exercise Prescription Changes - 10/31/18 1200      Response to Exercise   Blood Pressure (Admit)  130/62    Blood Pressure (Exit)  122/70    Heart Rate (Admit)  104 bpm    Heart Rate (Exercise)  125 bpm    Heart Rate (Exit)  93 bpm    Oxygen Saturation (Admit)  89 %    Oxygen Saturation (Exercise)  88 %    Oxygen Saturation (Exit)  94 %    Rating of Perceived Exertion (Exercise)  13     Perceived Dyspnea (Exercise)  2    Duration  Continue with 45 min of aerobic exercise without signs/symptoms of physical distress.    Intensity  THRR unchanged      Progression   Progression  Continue to progress workloads to maintain intensity without signs/symptoms of physical distress.    Average METs  2      Resistance Training   Training Prescription  Yes    Weight  4 lb    Reps  10-15      Interval Training   Interval Training  No      Treadmill   MPH  1    Grade  0    Minutes  15    METs  1.77      NuStep   Level  6    SPM  80    Minutes  15    METs  2      Biostep-RELP   Level  12    SPM  50    Minutes  15    METs  2      Home Exercise Plan   Plans to continue exercise at  Home (comment)   walking at home   Frequency  Add 2 additional days to program exercise sessions.    Initial Home Exercises Provided  09/24/18       Nutrition:  Target Goals: Understanding of nutrition guidelines, daily intake of sodium '1500mg'$ , cholesterol '200mg'$ , calories 30% from fat and 7% or less from saturated  fats, daily to have 5 or more servings of fruits and vegetables.  Biometrics: Pre Biometrics - 08/07/18 1625      Pre Biometrics   Height  5' 7.6" (1.717 m)    Weight  (!) 306 lb 14.4 oz (139.2 kg)    Waist Circumference  47.5 inches    Hip Circumference  51 inches    Waist to Hip Ratio  0.93 %    BMI (Calculated)  47.22    Single Leg Stand  0 seconds        Nutrition Therapy Plan and Nutrition Goals: Nutrition Therapy & Goals - 08/29/18 1203      Nutrition Therapy   Diet  DM    Drug/Food Interactions  Statins/Certain Fruits    Protein (specify units)  12oz    Fiber  35 grams    Whole Grain Foods  3 servings   chooses whole grain breads   Saturated Fats  15 max. grams    Fruits and Vegetables  6 servings/day   8 ideal, eats fruits and vegetables daily   Sodium  1500 grams      Personal Nutrition Goals   Nutrition Goal  Practice eating on a consistent  schedule each day. For times when you are not hungry for a meal, it may be helpful to at least have a snack with protein + a carb serving to best manage BG control. This is especially important in the evenings    Personal Goal #2  Add a protein and/or fiber source to meals that are carb-heavy such as cereal to prevent hyperglycemia    Personal Goal #3  Eat things like chocolate in moderation and choose sugar-free varieties when possible    Comments  He does not follow a diabetic diet but does try to monitor CHO intake and not eat sweets other than chocolate (sometimes SF). he is lactose intolerant and tries not to add salt to foods, though does eat fried foods. Reports BG control over the past month to be fair-poor. Eats whole wheat bread, a variety of fruits and vegetables, and nuts. Chooses SF beverages. Reports to skip meals occasionally d/t lack of hunger      Intervention Plan   Intervention  Prescribe, educate and counsel regarding individualized specific dietary modifications aiming towards targeted core components such as weight, hypertension, lipid management, diabetes, heart failure and other comorbidities.    Expected Outcomes  Short Term Goal: Understand basic principles of dietary content, such as calories, fat, sodium, cholesterol and nutrients.;Short Term Goal: A plan has been developed with personal nutrition goals set during dietitian appointment.;Long Term Goal: Adherence to prescribed nutrition plan.       Nutrition Assessments: Nutrition Assessments - 08/07/18 1602      MEDFICTS Scores   Pre Score  46       Nutrition Goals Re-Evaluation: Nutrition Goals Re-Evaluation    [place] Name 08/29/18 1234 10/01/18 1135           Goals   Nutrition Goal  Practice eating on a consistent schedule each day. For times when you are not hungry for a meal, it may be helpful to at least have a snack with protein + a carb serving to best manage BG control. This is especially important in the  evenings  Practice eating on a consistent schedule each day; add a protein and/or fiber source at meals; eat things like chocolate in moderation and choose sugar free varieties when possible      Comment  He skips meals occasionally d/t lack of hunger. Reports to have a small snack like 1/2 pack of PB crackers rather than a meal sometimes but may also eat nothing. Last night he skipped dinner and did not eat again until before class today  Pt's wife has been helpful in facilitating new eating habits/ schedule for pt. He feel that he is now eating on a more consistent schedule each day and is offered at least a protein at meal times. This morning for breakfast he had an egg, sausage and cheese sandwich rather than cereal. His wife has also stopped buying chocolate and other sweets      Expected Outcome  He will eat a snack that includes a carb source + a protein source in place of meals if not hungry to avoid going long periods of time without eating to improve BG control. He will do his best to eat on a consistent schedule each day  He will continue to eat 3 meals/day on a regular schedule with snacks as needed and include protien/fiber rich options. He will find alternatives for high-sugar snacks such as chocolate, as he does not think he will be able to cut these foods out completely.        Personal Goal #2 Re-Evaluation   Personal Goal #2  Add a protein and/or fiber source to meals that are carb-heavy such as cereal to prevent hyperglycemia  -        Personal Goal #3 Re-Evaluation   Personal Goal #3  Eat things like chocolate in moderation and choose sugar free varieties when possible  -         Nutrition Goals Discharge (Final Nutrition Goals Re-Evaluation): Nutrition Goals Re-Evaluation - 10/01/18 1135      Goals   Nutrition Goal  Practice eating on a consistent schedule each day; add a protein and/or fiber source at meals; eat things like chocolate in moderation and choose sugar free varieties  when possible    Comment  Pt's wife has been helpful in facilitating new eating habits/ schedule for pt. He feel that he is now eating on a more consistent schedule each day and is offered at least a protein at meal times. This morning for breakfast he had an egg, sausage and cheese sandwich rather than cereal. His wife has also stopped buying chocolate and other sweets    Expected Outcome  He will continue to eat 3 meals/day on a regular schedule with snacks as needed and include protien/fiber rich options. He will find alternatives for high-sugar snacks such as chocolate, as he does not think he will be able to cut these foods out completely.       Psychosocial: Target Goals: Acknowledge presence or absence of significant depression and/or stress, maximize coping skills, provide positive support system. Participant is able to verbalize types and ability to use techniques and skills needed for reducing stress and depression.   Initial Review & Psychosocial Screening: Initial Psych Review & Screening - 08/07/18 1523      Initial Review   Current issues with  Current Sleep Concerns;Current Stress Concerns    Source of Stress Concerns  Chronic Illness;Unable to perform yard/household activities    Comments  The patient is not mobile and his mother is 68 and is making life stressful for him.      Family Dynamics   Good Support System?  Yes    Comments  He can look to his wifes family for support.  Barriers   Psychosocial barriers to participate in program  There are no identifiable barriers or psychosocial needs.;The patient should benefit from training in stress management and relaxation.      Screening Interventions   Interventions  Encouraged to exercise;Program counselor consult;Provide feedback about the scores to participant;To provide support and resources with identified psychosocial needs    Expected Outcomes  Short Term goal: Utilizing psychosocial counselor, staff and physician  to assist with identification of specific Stressors or current issues interfering with healing process. Setting desired goal for each stressor or current issue identified.;Long Term Goal: Stressors or current issues are controlled or eliminated.;Short Term goal: Identification and review with participant of any Quality of Life or Depression concerns found by scoring the questionnaire.;Long Term goal: The participant improves quality of Life and PHQ9 Scores as seen by post scores and/or verbalization of changes       Quality of Life Scores:  Scores of 19 and below usually indicate a poorer quality of life in these areas.  A difference of  2-3 points is a clinically meaningful difference.  A difference of 2-3 points in the total score of the Quality of Life Index has been associated with significant improvement in overall quality of life, self-image, physical symptoms, and general health in studies assessing change in quality of life.  PHQ-9: Recent Review Flowsheet Data    Depression screen [facility] 2/9 09/28/2018 08/07/2018   Decreased Interest 0 1   Down, Depressed, Hopeless 0 2   PHQ - 2 Score 0 3   Altered sleeping 0 2   Tired, decreased energy 1 2   Change in appetite 0 0   Feeling bad or failure about yourself  0 0   Trouble concentrating 0 0   Moving slowly or fidgety/restless 0 0   Suicidal thoughts 0 0   PHQ-9 Score 1 7   Difficult doing work/chores Not difficult at all Very difficult     Interpretation of Total Score  Total Score Depression Severity:  1-4 = Minimal depression, 5-9 = Mild depression, 10-14 = Moderate depression, 15-19 = Moderately severe depression, 20-27 = Severe depression   Psychosocial Evaluation and Intervention: Psychosocial Evaluation - 08/29/18 1131      Psychosocial Evaluation & Interventions   Interventions  Stress management education;Relaxation education;Encouraged to exercise with the program and follow exercise prescription    Comments  Counselor met  with Mr. Wilber Rye) today for initial psychosocial evaluation.  He is a 67 year old who has COPD; as well as diabetes; sleep apnea and a recent cancer survivor.  Ricky Johnston has a strong support system with a spouse of 18 years; (2) step children with positive relationships and (2) half sisters.  Ricky Johnston reports sleeping fairly well in a recliner for approximately 8-9 hours most nights.  He uses a CPAP for this.  He reports his appetite is "too good" and would like to lose some weight.  Ricky Johnston denies a history of depression or anxiety or any current symptoms and is typically in a positive mood.  However, Ricky Johnston reports his 60 year old mother in [place] is dying and this has been stressful and painful for him - impacting his mood and sleep at times.  Counselor processed this with Ricky Johnston and provided support - offering a grief support group if needed.  Ricky Johnston has goals to improve his balance and oxygen levels and possibly lose some weight while in this program.     Expected Outcomes  Short:  Ricky Johnston will exercise  for his health and mental health as a positive coping strategy.  He will meet with the dietician for a healthy nutrition plan.  Ricky Johnston will inform this counselor if needs further information/support through this time of grief/loss with his mother's illness.  Long:  [place] will develop a pattern of healthy lifestyle choices for his health and mental health.      Continue Psychosocial Services   Follow up required by staff       Psychosocial Re-Evaluation: Psychosocial Re-Evaluation    [place] Name 09/24/18 1103 11/07/18 1422           Psychosocial Re-Evaluation   Current issues with  Current Stress Concerns;Current Sleep Concerns  Current Stress Concerns;Current Sleep Concerns      Comments  Patient is using his BIPAP machine all the time now and states he is sleeping like a rock. His mom is in bad shape and he will be stressed until she passes. His moms health and memory are going down hill. He gets down about his  health and sees how his health has declined. He thinks about the past when he was in the [employer] and it makes him aggravated that he cannot do the things he used to. He wants to get back in shape and be healthy again. Ricky Johnston has a good attitude and is ready to make a change.  Ricky Johnston states things are going very except for his bladder. He states that his doctor found a red dot on bladder and it could be cancer again, They are going to run a biopsy to see if is is malignant. Ricky Johnston is in high spirits and is willing to do what it takes to get healthier.      Expected Outcomes  Short: attend [facility] regularly. Long: maintain exercise to decrease stress.  Short: Attend [facility] stress management education to decrease stress. Long: Maintain exercise Post [facility] to keep stress at a minimum      Interventions  Encouraged to attend Pulmonary Rehabilitation for the exercise  Encouraged to attend Pulmonary Rehabilitation for the exercise      Continue Psychosocial Services   Follow up required by staff  Follow up required by staff        Initial Review   Source of Stress Concerns  Chronic Illness;Unable to perform yard/household activities  -         Psychosocial Discharge (Final Psychosocial Re-Evaluation): Psychosocial Re-Evaluation - 11/07/18 1422      Psychosocial Re-Evaluation   Current issues with  Current Stress Concerns;Current Sleep Concerns    Comments  Ricky Johnston states things are going very except for his bladder. He states that his doctor found a red dot on bladder and it could be cancer again, They are going to run a biopsy to see if is is malignant. Ricky Johnston is in high spirits and is willing to do what it takes to get healthier.    Expected Outcomes  Short: Attend [facility] stress management education to decrease stress. Long: Maintain exercise Post [facility] to keep stress at a minimum    Interventions  Encouraged to attend Pulmonary Rehabilitation for the exercise    Continue Psychosocial Services    Follow up required by staff       Education: Education Goals: Education classes will be provided on a weekly basis, covering required topics. Participant will state understanding/return demonstration of topics presented.  Learning Barriers/Preferences: Learning Barriers/Preferences - 08/07/18 1526      Learning Barriers/Preferences   Learning Barriers  Hearing  Learning Preferences  None       Education Topics:  Initial Evaluation Education: - Verbal, written and demonstration of respiratory meds, oximetry and breathing techniques. Instruction on use of nebulizers and MDIs and importance of monitoring MDI activations.   Pulmonary Rehab from 11/07/2018 in [facility] Cardiac and Pulmonary Rehab  Date  08/07/18  Educator  [facility]  Instruction Review Code  1- Verbalizes Understanding      General Nutrition Guidelines/Fats and Fiber: -Group instruction provided by verbal, written material, models and posters to present the general guidelines for heart healthy nutrition. Gives an explanation and review of dietary fats and fiber.   Pulmonary Rehab from 11/07/2018 in [facility] Cardiac and Pulmonary Rehab  Date  10/03/18  Educator  [facility]  Instruction Review Code  1- Verbalizes Understanding      Controlling Sodium/Reading Food Labels: -Group verbal and written material supporting the discussion of sodium use in heart healthy nutrition. Review and explanation with models, verbal and written materials for utilization of the food label.   Pulmonary Rehab from 11/07/2018 in [facility] Cardiac and Pulmonary Rehab  Date  10/17/18  Educator  [facility]  Instruction Review Code  1- Verbalizes Understanding      Exercise Physiology & General Exercise Guidelines: - Group verbal and written instruction with models to review the exercise physiology of the cardiovascular system and associated critical values. Provides general exercise guidelines with specific guidelines to those with heart or lung disease.    Aerobic  Exercise & Resistance Training: - Gives group verbal and written instruction on the various components of exercise. Focuses on aerobic and resistive training programs and the benefits of this training and how to safely progress through these programs.   Flexibility, Balance, Mind/Body Relaxation: Provides group verbal/written instruction on the benefits of flexibility and balance training, including mind/body exercise modes such as yoga, pilates and tai chi.  Demonstration and skill practice provided.   Stress and Anxiety: - Provides group verbal and written instruction about the health risks of elevated stress and causes of high stress.  Discuss the correlation between heart/lung disease and anxiety and treatment options. Review healthy ways to manage with stress and anxiety.   Pulmonary Rehab from 11/07/2018 in [facility] Cardiac and Pulmonary Rehab  Date  10/10/18  Educator  [facility]  Instruction Review Code  1- Verbalizes Understanding      Depression: - Provides group verbal and written instruction on the correlation between heart/lung disease and depressed mood, treatment options, and the stigmas associated with seeking treatment.   Exercise & Equipment Safety: - Individual verbal instruction and demonstration of equipment use and safety with use of the equipment.   Pulmonary Rehab from 11/07/2018 in [facility] Cardiac and Pulmonary Rehab  Date  08/07/18  Educator  [facility]  Instruction Review Code  1- Verbalizes Understanding      Infection Prevention: - Provides verbal and written material to individual with discussion of infection control including proper hand washing and proper equipment cleaning during exercise session.   Pulmonary Rehab from 11/07/2018 in [facility] Cardiac and Pulmonary Rehab  Date  08/07/18  Educator  [facility]  Instruction Review Code  1- Verbalizes Understanding      Falls Prevention: - Provides verbal and written material to individual with discussion of falls prevention and  safety.   Pulmonary Rehab from 11/07/2018 in [facility] Cardiac and Pulmonary Rehab  Date  08/07/18  Educator  [facility]  Instruction Review Code  1- Verbalizes Understanding      Diabetes: - Individual verbal  and written instruction to review signs/symptoms of diabetes, desired ranges of glucose level fasting, after meals and with exercise. Advice that pre and post exercise glucose checks will be done for 3 sessions at entry of program.   Chronic Lung Diseases: - Group verbal and written instruction to review updates, respiratory medications, advancements in procedures and treatments. Discuss use of supplemental oxygen including available portable oxygen systems, continuous and intermittent flow rates, concentrators, personal use and safety guidelines. Review proper use of inhaler and spacers. Provide informative websites for self-education.    Energy Conservation: - Provide group verbal and written instruction for methods to conserve energy, plan and organize activities. Instruct on pacing techniques, use of adaptive equipment and posture/positioning to relieve shortness of breath.   Pulmonary Rehab from 11/07/2018 in [facility] Cardiac and Pulmonary Rehab  Date  09/26/18  Educator  [facility]  Instruction Review Code  1- Verbalizes Understanding      Triggers and Exacerbations: - Group verbal and written instruction to review types of environmental triggers and ways to prevent exacerbations. Discuss weather changes, air quality and the benefits of nasal washing. Review warning signs and symptoms to help prevent infections. Discuss techniques for effective airway clearance, coughing, and vibrations.   Pulmonary Rehab from 11/07/2018 in [facility] Cardiac and Pulmonary Rehab  Date  08/29/18  Educator  [facility]  Instruction Review Code  1- Verbalizes Understanding      AED/CPR: - Group verbal and written instruction with the use of models to demonstrate the basic use of the AED with the basic ABC's of  resuscitation.   Anatomy and Physiology of the Lungs: - Group verbal and written instruction with the use of models to provide basic lung anatomy and physiology related to function, structure and complications of lung disease.   Anatomy & Physiology of the Heart: - Group verbal and written instruction and models provide basic cardiac anatomy and physiology, with the coronary electrical and arterial systems. Review of Valvular disease and Heart Failure   Pulmonary Rehab from 11/07/2018 in [facility] Cardiac and Pulmonary Rehab  Date  09/07/18  Educator  [facility]  Instruction Review Code  1- Verbalizes Understanding      Cardiac Medications: - Group verbal and written instruction to review commonly prescribed medications for heart disease. Reviews the medication, class of the drug, and side effects.   Know Your Numbers and Risk Factors: -Group verbal and written instruction about important numbers in your health.  Discussion of what are risk factors and how they play a role in the disease process.  Review of Cholesterol, Blood Pressure, Diabetes, and BMI and the role they play in your overall health.   Pulmonary Rehab from 11/07/2018 in [facility] Cardiac and Pulmonary Rehab  Date  08/24/18  Educator  [facility]  Instruction Review Code  1- Verbalizes Understanding      Sleep Hygiene: -Provides group verbal and written instruction about how sleep can affect your health.  Define sleep hygiene, discuss sleep cycles and impact of sleep habits. Review good sleep hygiene tips.    Pulmonary Rehab from 11/07/2018 in [facility] Cardiac and Pulmonary Rehab  Date  11/07/18  Educator  [facility]  Instruction Review Code  1- Verbalizes Understanding      Other: -Provides group and verbal instruction on various topics (see comments)    Knowledge Questionnaire Score: Knowledge Questionnaire Score - 08/07/18 1526      Knowledge Questionnaire Score   Pre Score  16/18   reviewed with patient  Core Components/Risk  Factors/Patient Goals at Admission: Personal Goals and Risk Factors at Admission - 08/07/18 1526      Core Components/Risk Factors/Patient Goals on Admission    Weight Management  Yes;Weight Loss    Intervention  Weight Management: Develop a combined nutrition and exercise program designed to reach desired caloric intake, while maintaining appropriate intake of nutrient and fiber, sodium and fats, and appropriate energy expenditure required for the weight goal.;Weight Management: Provide education and appropriate resources to help participant work on and attain dietary goals.;Weight Management/Obesity: Establish reasonable short term and long term weight goals.;Obesity: Provide education and appropriate resources to help participant work on and attain dietary goals.    Admit Weight  306 lb 14.4 oz (139.2 kg)    Goal Weight: Short Term  301 lb (136.5 kg)    Goal Weight: Long Term  250 lb (113.4 kg)    Expected Outcomes  Short Term: Continue to assess and modify interventions until short term weight is achieved;Long Term: Adherence to nutrition and physical activity/exercise program aimed toward attainment of established weight goal;Understanding recommendations for meals to include 15-35% energy as protein, 25-35% energy from fat, 35-60% energy from carbohydrates, less than '200mg'$  of dietary cholesterol, 20-35 gm of total fiber daily;Understanding of distribution of calorie intake throughout the day with the consumption of 4-5 meals/snacks    Improve shortness of breath with ADL's  Yes    Intervention  Provide education, individualized exercise plan and daily activity instruction to help decrease symptoms of SOB with activities of daily living.    Expected Outcomes  Long Term: Be able to perform more ADLs without symptoms or delay the onset of symptoms;Short Term: Improve cardiorespiratory fitness to achieve a reduction of symptoms when performing ADLs    Diabetes  Yes    Intervention  Provide education  about signs/symptoms and action to take for hypo/hyperglycemia.;Provide education about proper nutrition, including hydration, and aerobic/resistive exercise prescription along with prescribed medications to achieve blood glucose in normal ranges: Fasting glucose 65-99 mg/dL    Expected Outcomes  Short Term: Participant verbalizes understanding of the signs/symptoms and immediate care of hyper/hypoglycemia, proper foot care and importance of medication, aerobic/resistive exercise and nutrition plan for blood glucose control.;Long Term: Attainment of HbA1C < 7%.    Hypertension  Yes    Intervention  Provide education on lifestyle modifcations including regular physical activity/exercise, weight management, moderate sodium restriction and increased consumption of fresh fruit, vegetables, and low fat dairy, alcohol moderation, and smoking cessation.;Monitor prescription use compliance.    Expected Outcomes  Short Term: Continued assessment and intervention until BP is < 140/32m HG in hypertensive participants. < 130/889mHG in hypertensive participants with diabetes, heart failure or chronic kidney disease.;Long Term: Maintenance of blood pressure at goal levels.    Lipids  Yes    Intervention  Provide education and support for participant on nutrition & aerobic/resistive exercise along with prescribed medications to achieve LDL '70mg'$ , HDL >'40mg'$ .    Expected Outcomes  Short Term: Participant states understanding of desired cholesterol values and is compliant with medications prescribed. Participant is following exercise prescription and nutrition guidelines.;Long Term: Cholesterol controlled with medications as prescribed, with individualized exercise RX and with personalized nutrition plan. Value goals: LDL < '70mg'$ , HDL > 40 mg.       Core Components/Risk Factors/Patient Goals Review:  Goals and Risk Factor Review    Row Name 09/10/18 1029 09/28/18 1025 11/07/18 1426         Core Components/Risk  Factors/Patient Goals Review   Personal Goals Review  Weight Management/Obesity;Improve shortness of breath with ADL's;Hypertension;Lipids;Diabetes  Weight Management/Obesity;Improve shortness of breath with ADL's;Hypertension;Lipids;Diabetes  Weight Management/Obesity;Improve shortness of breath with ADL's;Hypertension;Lipids;Diabetes     Review  Ricky Johnston has reached his short term goal of losing 5 pounds. He wants to lose weight and continue to improve his ADL's. He has been checking his sugar at home and has had good reading in [place].   Ricky Johnston has been working hard and his wife can see a difference in his ability to do things, He has been checking his sugar in the morning which is lower but higher than he wants it to be. His A1C is 7.9. His fasting blood sugar is 192 on average.  Ricky Johnston blood pressure has been improving since the start of the program. He is controlling his blood sugar at home and has yet to check his A1c. Ricky Johnston gets short of breath on the treadmill and is one machine that is most difficult for him. Ricky Johnston has lost 5-10 pounds since the start of the program and wants to keep going.      Expected Outcomes  Short: continue [facility] to lose more weight. Long: lose another 5 pounds in the next month.  Short: work on lowering fasting blood sugar. Long: have an A1c lower than 7.  Short: Attend [facility] regularly to improve shortness of breath with ADL's. Long: maintain independence with ADL's         Core Components/Risk Factors/Patient Goals at Discharge (Final Review):  Goals and Risk Factor Review - 11/07/18 1426      Core Components/Risk Factors/Patient Goals Review   Personal Goals Review  Weight Management/Obesity;Improve shortness of breath with ADL's;Hypertension;Lipids;Diabetes    Review  Ricky Johnston blood pressure has been improving since the start of the program. He is controlling his blood sugar at home and has yet to check his A1c. Ricky Johnston gets short of breath on the treadmill and is one  machine that is most difficult for him. Ricky Johnston has lost 5-10 pounds since the start of the program and wants to keep going.     Expected Outcomes  Short: Attend [facility] regularly to improve shortness of breath with ADL's. Long: maintain independence with ADL's        ITP Comments: ITP Comments    Row Name 08/07/18 1451 08/20/18 0821 09/17/18 0826 09/17/18 0923 10/15/18 0827   ITP Comments  Medical Evaluation completed. Chart sent for review and changes to Dr. Emily Filbert Director of [place]. Diagnosis can be found in [facility] media patient is a [place] patient  30 day review completed. ITP sent to Dr. Emily Filbert Director of [place]. Continue with ITP unless changes are made by physician  30 day review completed. ITP sent to Dr. Emily Filbert Director of [place]. Continue with ITP unless changes are made by physician.  Ricky Johnston states that he hurt is back somehow and will try to return on Wednesday.  30 day review completed. ITP sent to Dr. Emily Filbert Director of [place]. Continue with ITP unless changes are made by physician.   Ricky Johnston Name 11/12/18 0821           ITP Comments  30 day review completed. ITP sent to Dr. Emily Filbert Director of [place]. Continue with ITP unless changes are made by physician.          Comments: 30 day review

## 2018-11-14 ENCOUNTER — Encounter: Payer: No Typology Code available for payment source | Admitting: *Deleted

## 2018-11-14 DIAGNOSIS — J449 Chronic obstructive pulmonary disease, unspecified: Secondary | ICD-10-CM | POA: Diagnosis not present

## 2018-11-14 NOTE — Progress Notes (Signed)
Daily Session Note  Patient Details  Name: Shine Mikes MRN: [id number] Date of Birth: 03/30/1951 Referring Provider:     Pulmonary Rehab from 08/07/2018 in [facility] Cardiac and Pulmonary Rehab  Referring Provider  Suzie Portela MD [[facility]]      Encounter Date: 11/14/2018  Check In: Session Check In - 11/14/18 0958      Check-In   Supervising physician immediately available to respond to emergencies  [facility] immediately available ER MD    Physician(s)  Drs. Alyse Low    Location  [facility]-Cardiac & Pulmonary Rehab    Staff Present  Jasper Loser BS, Exercise Physiologist;Nadia Torr Luan [place], [place], RCEP, CCRP, Exercise Physiologist;Joseph Tessie Fass RCP,RRT,BSRT    Medication changes reported      No    Fall or balance concerns reported     No    Warm-up and Cool-down  Performed as group-led instruction    Resistance Training Performed  Yes    VAD Patient?  No    PAD/SET Patient?  No      Pain Assessment   Currently in Pain?  No/denies          Social History   Tobacco Use  Smoking Status Former Smoker  . Packs/day: 1.00  . Years: 33.00  . Pack years: 33.00  . Types: Cigarettes  . Last attempt to quit: 04/06/2000  . Years since quitting: 18.6  Smokeless Tobacco Never Used  Tobacco Comment   use to chew cigars    Goals Met:  Proper associated with RPD/PD & O2 Sat Independence with exercise equipment Using PLB without cueing & demonstrates good technique Exercise tolerated well No report of cardiac concerns or symptoms Strength training completed today  Goals Unmet:  Not Applicable  Comments: Pt able to follow exercise prescription today without complaint.  Will continue to monitor for progression.    Dr. Emily Filbert is Medical Director for [place] and [facility].

## 2018-11-16 ENCOUNTER — Encounter: Payer: No Typology Code available for payment source | Admitting: *Deleted

## 2018-11-16 DIAGNOSIS — J449 Chronic obstructive pulmonary disease, unspecified: Secondary | ICD-10-CM

## 2018-11-16 NOTE — Progress Notes (Signed)
Daily Session Note  Patient Details  Name: Ricky Johnston MRN: [id number] Date of Birth: March 20, 1951 Referring Provider:     Pulmonary Rehab from 08/07/2018 in [facility] Cardiac and Pulmonary Rehab  Referring Provider  Suzie Portela MD [[facility]]      Encounter Date: 11/16/2018  Check In: Session Check In - 11/16/18 1018      Check-In   Supervising physician immediately available to respond to emergencies  [facility] immediately available ER MD    Physician(s)  Dr. Corky Downs and [facility]    Location  [facility]-Cardiac & Pulmonary Rehab    Staff Present  Renita Papa, RN BSN;Joseph [facility], [place], ACSM CEP, Exercise Physiologist    Medication changes reported      No    Fall or balance concerns reported     No    Tobacco Cessation  No Change    Warm-up and Cool-down  Performed as group-led instruction    Resistance Training Performed  Yes    VAD Patient?  No    PAD/SET Patient?  No      Pain Assessment   Currently in Pain?  No/denies          Social History   Tobacco Use  Smoking Status Former Smoker  . Packs/day: 1.00  . Years: 33.00  . Pack years: 33.00  . Types: Cigarettes  . Last attempt to quit: 04/06/2000  . Years since quitting: 18.6  Smokeless Tobacco Never Used  Tobacco Comment   use to chew cigars    Goals Met:  Proper associated with RPD/PD & O2 Sat Independence with exercise equipment Using PLB without cueing & demonstrates good technique Exercise tolerated well No report of cardiac concerns or symptoms Strength training completed today  Goals Unmet:  Not Applicable  Comments: Pt able to follow exercise prescription today without complaint.  Will continue to monitor for progression.    Dr. Emily Filbert is Medical Director for [place] and [facility].

## 2018-11-19 ENCOUNTER — Encounter: Payer: No Typology Code available for payment source | Admitting: *Deleted

## 2018-11-19 DIAGNOSIS — J449 Chronic obstructive pulmonary disease, unspecified: Secondary | ICD-10-CM

## 2018-11-19 NOTE — Progress Notes (Signed)
Daily Session Note  Patient Details  Name: Ricky Johnston MRN: [id number] Date of Birth: 12/20/1950 Referring Provider:     Pulmonary Rehab from 08/07/2018 in [facility] Cardiac and Pulmonary Rehab  Referring Provider  Suzie Portela MD [[facility]]      Encounter Date: 11/19/2018  Check In: Session Check In - 11/19/18 1011      Check-In   Supervising physician immediately available to respond to emergencies  [facility] immediately available ER MD    Physician(s)  Dr. Clearnce Hasten and Dr. Joni Fears    Location  [facility]-Cardiac & Pulmonary Rehab    Staff Present  Earlean Shawl, BS, ACSM CEP, Exercise Physiologist;Jeanna Durrell BS, Exercise Physiologist;Amanda Oletta Darter, BA, ACSM CEP, Exercise Physiologist    Medication changes reported      No    Fall or balance concerns reported     No    Tobacco Cessation  No Change    Warm-up and Cool-down  Performed as group-led instruction    Resistance Training Performed  Yes    VAD Patient?  No    PAD/SET Patient?  No      Pain Assessment   Currently in Pain?  No/denies    Multiple Pain Sites  No          Social History   Tobacco Use  Smoking Status Former Smoker  . Packs/day: 1.00  . Years: 33.00  . Pack years: 33.00  . Types: Cigarettes  . Last attempt to quit: 04/06/2000  . Years since quitting: 18.6  Smokeless Tobacco Never Used  Tobacco Comment   use to chew cigars    Goals Met:  Proper associated with RPD/PD & O2 Sat Independence with exercise equipment Exercise tolerated well No report of cardiac concerns or symptoms Strength training completed today  Goals Unmet:  Not Applicable  Comments: Pt able to follow exercise prescription today without complaint.  Will continue to monitor for progression.    Dr. Emily Filbert is Medical Director for [place] and [facility].

## 2018-11-30 ENCOUNTER — Encounter: Payer: No Typology Code available for payment source | Attending: *Deleted

## 2018-11-30 DIAGNOSIS — E119 Type 2 diabetes mellitus without complications: Secondary | ICD-10-CM | POA: Insufficient documentation

## 2018-11-30 DIAGNOSIS — Z87442 Personal history of urinary calculi: Secondary | ICD-10-CM | POA: Diagnosis not present

## 2018-11-30 DIAGNOSIS — Z7982 Long term (current) use of aspirin: Secondary | ICD-10-CM | POA: Diagnosis not present

## 2018-11-30 DIAGNOSIS — Z87891 Personal history of nicotine dependence: Secondary | ICD-10-CM | POA: Diagnosis not present

## 2018-11-30 DIAGNOSIS — Z79899 Other long term (current) drug therapy: Secondary | ICD-10-CM | POA: Insufficient documentation

## 2018-11-30 DIAGNOSIS — G4730 Sleep apnea, unspecified: Secondary | ICD-10-CM | POA: Insufficient documentation

## 2018-11-30 DIAGNOSIS — Z86718 Personal history of other venous thrombosis and embolism: Secondary | ICD-10-CM | POA: Insufficient documentation

## 2018-11-30 DIAGNOSIS — M1990 Unspecified osteoarthritis, unspecified site: Secondary | ICD-10-CM | POA: Insufficient documentation

## 2018-11-30 DIAGNOSIS — E7800 Pure hypercholesterolemia, unspecified: Secondary | ICD-10-CM | POA: Diagnosis not present

## 2018-11-30 DIAGNOSIS — G2581 Restless legs syndrome: Secondary | ICD-10-CM | POA: Insufficient documentation

## 2018-11-30 DIAGNOSIS — J449 Chronic obstructive pulmonary disease, unspecified: Secondary | ICD-10-CM | POA: Diagnosis not present

## 2018-11-30 DIAGNOSIS — Z7951 Long term (current) use of inhaled steroids: Secondary | ICD-10-CM | POA: Diagnosis not present

## 2018-11-30 DIAGNOSIS — Z794 Long term (current) use of insulin: Secondary | ICD-10-CM | POA: Diagnosis not present

## 2018-11-30 DIAGNOSIS — Z8551 Personal history of malignant neoplasm of bladder: Secondary | ICD-10-CM | POA: Diagnosis not present

## 2018-11-30 DIAGNOSIS — J8410 Pulmonary fibrosis, unspecified: Secondary | ICD-10-CM | POA: Diagnosis not present

## 2018-11-30 DIAGNOSIS — K219 Gastro-esophageal reflux disease without esophagitis: Secondary | ICD-10-CM | POA: Diagnosis not present

## 2018-11-30 DIAGNOSIS — I10 Essential (primary) hypertension: Secondary | ICD-10-CM | POA: Insufficient documentation

## 2018-11-30 DIAGNOSIS — H9190 Unspecified hearing loss, unspecified ear: Secondary | ICD-10-CM | POA: Diagnosis not present

## 2018-11-30 NOTE — Progress Notes (Signed)
Daily Session Note  Patient Details  Name: Ricky Johnston MRN: [id number] Date of Birth: 10-Feb-1951 Referring Provider:     Pulmonary Rehab from 08/07/2018 in [facility] Cardiac and Pulmonary Rehab  Referring Provider  Suzie Portela MD [[facility]]      Encounter Date: 11/30/2018  Check In: Session Check In - 11/30/18 [place]      Check-In   Supervising physician immediately available to respond to emergencies  [facility] immediately available ER MD    Physician(s)  Dr. Cinda Quest and [facility]    Location  [facility]-Cardiac & Pulmonary Rehab    Staff Present  Justin Mend RCP,RRT,BSRT;Meredith Sherryll Burger, RN Vickki Hearing, BA, ACSM CEP, Exercise Physiologist    Medication changes reported      No    Fall or balance concerns reported     No    Warm-up and Cool-down  Performed as group-led instruction    Resistance Training Performed  Yes    VAD Patient?  No    PAD/SET Patient?  No      Pain Assessment   Currently in Pain?  No/denies          Social History   Tobacco Use  Smoking Status Former Smoker  . Packs/day: 1.00  . Years: 33.00  . Pack years: 33.00  . Types: Cigarettes  . Last attempt to quit: 04/06/2000  . Years since quitting: 18.6  Smokeless Tobacco Never Used  Tobacco Comment   use to chew cigars    Goals Met:  Independence with exercise equipment Exercise tolerated well No report of cardiac concerns or symptoms Strength training completed today  Goals Unmet:  Not Applicable  Comments: Pt able to follow exercise prescription today without complaint.  Will continue to monitor for progression.    Dr. Emily Filbert is Medical Director for [place] and [facility]monary [facility].

## 2018-12-03 ENCOUNTER — Encounter: Payer: No Typology Code available for payment source | Admitting: *Deleted

## 2018-12-03 DIAGNOSIS — J449 Chronic obstructive pulmonary disease, unspecified: Secondary | ICD-10-CM | POA: Diagnosis not present

## 2018-12-03 NOTE — Progress Notes (Signed)
Daily Session Note  Patient Details  Name: Ricky Johnston MRN: [id number] Date of Birth: 12-31-1950 Referring Provider:     Pulmonary Rehab from 08/07/2018 in [facility] Cardiac and Pulmonary Rehab  Referring Provider  Suzie Portela MD [[facility]]      Encounter Date: 12/03/2018  Check In: Session Check In - 12/03/18 1017      Check-In   Supervising physician immediately available to respond to emergencies  [facility] immediately available ER MD    Physician(s)  Dr. Joni Fears and Dr. Kerman Passey    Location  [facility]-Cardiac & Pulmonary Rehab    Staff Present  Earlean Shawl, BS, ACSM CEP, Exercise Physiologist;[facility], [place], ACSM CEP, Exercise Physiologist    Medication changes reported      No    Fall or balance concerns reported     No    Tobacco Cessation  No Change    Warm-up and Cool-down  Performed as group-led instruction    Resistance Training Performed  Yes    VAD Patient?  No    PAD/SET Patient?  No      Pain Assessment   Currently in Pain?  No/denies    Multiple Pain Sites  No          Social History   Tobacco Use  Smoking Status Former Smoker  . Packs/day: 1.00  . Years: 33.00  . Pack years: 33.00  . Types: Cigarettes  . Last attempt to quit: 04/06/2000  . Years since quitting: 18.6  Smokeless Tobacco Never Used  Tobacco Comment   use to chew cigars    Goals Met:  Proper associated with RPD/PD & O2 Sat Independence with exercise equipment Exercise tolerated well No report of cardiac concerns or symptoms Strength training completed today  Goals Unmet:  Not Applicable  Comments: Pt able to follow exercise prescription today without complaint.  Will continue to monitor for progression.    Dr. Emily Filbert is Medical Director for [place] and [facility].

## 2018-12-05 DIAGNOSIS — J449 Chronic obstructive pulmonary disease, unspecified: Secondary | ICD-10-CM

## 2018-12-05 NOTE — Progress Notes (Signed)
Daily Session Note  Patient Details  Name: Ricky Johnston MRN: [id number] Date of Birth: Dec 20, 1950 Referring Provider:     Pulmonary Rehab from 08/07/2018 in [facility] Cardiac and Pulmonary Rehab  Referring Provider  Suzie Portela MD [[facility]]      Encounter Date: 12/05/2018  Check In: Session Check In - 12/05/18 1011      Check-In   Supervising physician immediately available to respond to emergencies  [facility] immediately available ER MD    Physician(s)  Jimmye Norman and Mariea Clonts    Location  [facility]-Cardiac & Pulmonary Rehab    Staff Present  Alberteen Sam, MA, RCEP, CCRP, Exercise Physiologist;Joseph Foy [place], [place], ACSM CEP, Exercise Physiologist    Medication changes reported      No    Fall or balance concerns reported     No    Warm-up and Cool-down  Performed as group-led instruction    Resistance Training Performed  Yes    VAD Patient?  No    PAD/SET Patient?  No      Pain Assessment   Currently in Pain?  No/denies    Multiple Pain Sites  No          Social History   Tobacco Use  Smoking Status Former Smoker  . Packs/day: 1.00  . Years: 33.00  . Pack years: 33.00  . Types: Cigarettes  . Last attempt to quit: 04/06/2000  . Years since quitting: 18.6  Smokeless Tobacco Never Used  Tobacco Comment   use to chew cigars    Goals Met: Proper 02 Strength training completed   Goals Unmet:  Not Applicable  Comments: Pt able to follow exercise prescription today without complaint.  Will continue to monitor for progression.    Dr. Emily Filbert is Medical Director for [place] and [facility].

## 2018-12-10 DIAGNOSIS — J449 Chronic obstructive pulmonary disease, unspecified: Secondary | ICD-10-CM

## 2018-12-10 NOTE — Progress Notes (Signed)
Pulmonary Individual Treatment Plan  Patient Details  Name: Ricky Johnston MRN: [id number] Date of Birth: [birth year] Referring Provider:     Pulmonary Rehab from 08/07/2018 in [facility] Cardiac and Pulmonary [facility]  Referring Provider  Suzie Portela MD [[facility]]      Initial Encounter Date:    Pulmonary Rehab from 08/07/2018 in [facility] Cardiac and Pulmonary Rehab  Date  08/07/18      Visit Diagnosis: Chronic obstructive pulmonary disease, unspecified COPD type ([place])  Patient's Home Medications on Admission:  Current Outpatient Medications:  .  acetaminophen (TYLENOL) 325 MG tablet, Take 3 tablets (975 mg total) by mouth every 8 (eight) hours as needed for mild pain. (Patient taking differently: Take 975 mg by mouth 2 (two) times daily as needed for mild pain. ), Disp: , Rfl:  .  albuterol (PROVENTIL HFA;VENTOLIN HFA) 108 (90 Base) MCG/ACT inhaler, Inhale 2 puffs into the lungs every 6 (six) hours as needed for wheezing or shortness of breath., Disp: , Rfl:  .  ammonium lactate (LAC-HYDRIN) 12 % lotion, Apply 1 application topically daily as needed for dry skin., Disp: , Rfl:  .  aspirin 81 MG chewable tablet, Chew 1 tablet (81 mg total) by mouth 2 (two) times daily after a meal., Disp: 60 tablet, Rfl: 0 .  atorvastatin (LIPITOR) 80 MG tablet, Take 80 mg by mouth at bedtime., Disp: , Rfl:  .  budesonide-formoterol (SYMBICORT) 160-4.5 MCG/ACT inhaler, Inhale 2 puffs into the lungs 2 (two) times daily., Disp: , Rfl:  .  cetirizine (ZYRTEC) 10 MG tablet, Take 10 mg by mouth daily as needed for allergies., Disp: , Rfl:  .  cholecalciferol (VITAMIN D) 1000 UNITS tablet, Take 1,000 Units by mouth daily. , Disp: , Rfl:  .  dextrose (GLUTOSE) 40 % GEL, Take by mouth., Disp: , Rfl:  .  ferrous sulfate 325 (65 FE) MG tablet, Take 325 mg by mouth daily. , Disp: , Rfl:  .  fluticasone (FLONASE) 50 MCG/ACT nasal spray, , Disp: , Rfl:  .  furosemide (LASIX) 20 MG tablet, Take 40 mg by mouth daily. , Disp: , Rfl:   .  gabapentin (NEURONTIN) 300 MG capsule, Take 600 mg by mouth 2 (two) times daily. , Disp: , Rfl:  .  HUMULIN R U-500 KWIKPEN 500 UNIT/ML kwikpen, , Disp: , Rfl:  .  hydrocortisone 2.5 % lotion, Apply topically., Disp: , Rfl:  .  JARDIANCE 10 MG TABS tablet, , Disp: , Rfl:  .  ketoconazole (NIZORAL) 2 % shampoo, Apply topically., Disp: , Rfl:  .  losartan (COZAAR) 25 MG tablet, Take 25 mg by mouth daily., Disp: , Rfl:  .  metFORMIN (GLUCOPHAGE) 1000 MG tablet, Take 1,000 mg by mouth 2 (two) times daily with a meal., Disp: , Rfl:  .  methocarbamol (ROBAXIN) 500 MG tablet, Take 1 tablet (500 mg total) by mouth every 6 (six) hours as needed for muscle spasms., Disp: 60 tablet, Rfl: 0 .  nitrofurantoin, macrocrystal-monohydrate, (MACROBID) 100 MG capsule, , Disp: , Rfl:  .  omeprazole (PRILOSEC) 20 MG capsule, Take 20 mg by mouth 2 (two) times daily before a meal., Disp: , Rfl:  .  OXYGEN, Inhale 1 L into the lungs continuous. At night time may increase to 2 L as needed for shortness of breath, Disp: , Rfl:  .  rOPINIRole (REQUIP) 0.5 MG tablet, Take 0.5 mg by mouth at bedtime., Disp: , Rfl:  .  selenium sulfide (SELSUN) 2.5 % shampoo, Apply topically., Disp: ,  Rfl:  .  senna-docusate (SENOKOT-S) 8.6-50 MG tablet, Take by mouth., Disp: , Rfl:  .  sodium chloride (ALTAMIST SPRAY) 0.65 % nasal spray, Place into the nose., Disp: , Rfl:  .  SUMAtriptan 6 MG/0.5ML SOAJ, Inject into the skin., Disp: , Rfl:  .  SUPER B COMPLEX/C PO, Take 1 tablet by mouth 2 (two) times daily., Disp: , Rfl:  .  tamsulosin (FLOMAX) 0.4 MG CAPS capsule, Take 0.4 mg by mouth daily., Disp: , Rfl:  .  tiotropium (SPIRIVA HANDIHALER) 18 MCG inhalation capsule, Place 18 mcg into inhaler and inhale daily., Disp: , Rfl:   Past Medical History: Past Medical History:  Diagnosis Date  . Arthritis   . Cancer [facility])    bladder cancer currently 2016  . Complication of anesthesia    hard for him to wake up from Anesthesia from left  nephrectomy  . COPD (chronic obstructive pulmonary disease) ([place])   . Diabetes mellitus without complication ([place])   . DVT (deep venous thrombosis) ([place]) 01/08/2014, 2010   upper extremity  . GERD (gastroesophageal reflux disease)   . Hallux limitus 05/18/2015   from notes from [place]   . Headache    migraines  . History of kidney stones   . Hypercholesterolemia   . Hypertension   . Impaired hearing   . Intervertebral disc syndrome   . Kidney stones   . Pulmonary fibrosis ([place])   . Renal calculi 01/08/2014   frrom noted from [facility] .in chart  . Restless legs 01/08/2014  . Sciatic leg pain    paralysis of sciatic nerve  . Sleep apnea    CPAP/BIPAP  . Tinnitus     Tobacco Use: Social History   Tobacco Use  Smoking Status Former Smoker  . Packs/day: 1.00  . Years: 33.00  . Pack years: 33.00  . Types: Cigarettes  . Last attempt to quit: 04/06/2000  . Years since quitting: 18.6  Smokeless Tobacco Never Used  Tobacco Comment   use to chew cigars    Labs: Recent Review Flowsheet Data    Labs for ITP Cardiac and Pulmonary Rehab Latest Ref Rng & Units 01/08/2015 02/10/2017 02/10/2017 02/11/2017   Hemoglobin A1c 4.8 - 5.6 % 8.1(H) - - -   PHART 7.350 - 7.450 - 7.181(LL) 7.217(L) 7.298(L)   PCO2ART 32.0 - 48.0 mmHg - 82.8(HH) 73.1(HH) 62.5(H)   HCO3 20.0 - 28.0 mmol/L - 29.8(H) 28.6(H) 29.4(H)   ACIDBASEDEF 0.0 - 2.0 mmol/L - 0.7 0.8 -   O2SAT % - 98.5 93.5 96.1       Pulmonary Assessment Scores: Pulmonary Assessment Scores    Row Name 08/07/18 1558         ADL UCSD   ADL Phase  Entry     SOB Score total  75     Rest  3     Walk  5     Stairs  5     Bath  4     Dress  4     Shop  0       CAT Score   CAT Score  34       mMRC Score   mMRC Score  4        Pulmonary Function Assessment: Pulmonary Function Assessment - 08/07/18 1523      Initial Spirometry Results   FVC%  31 %    FEV1%  35 %    FEV1/FVC Ratio  85.68    Comments  good patient  effort      Post Bronchodilator Spirometry Results   FVC%  32.24 %    FEV1%  37.8 %    FEV1/FVC Ratio  89.05    Comments  good patient effort       Breath   Bilateral Breath Sounds  Clear;Decreased    Shortness of Breath  Limiting activity;Yes;Fear of Shortness of Breath       Exercise Target Goals: Exercise Program Goal: Individual exercise prescription set using results from initial 6 min walk test and THRR while considering  patient's activity barriers and safety.   Exercise Prescription Goal: Initial exercise prescription builds to 30-45 minutes a day of aerobic activity, 2-3 days per week.  Home exercise guidelines will be given to patient during program as part of exercise prescription that the participant will acknowledge.  Activity Barriers & Risk Stratification: Activity Barriers & Cardiac Risk Stratification - 08/07/18 1621      Activity Barriers & Cardiac Risk Stratification   Activity Barriers  Shortness of Breath;Balance Concerns;History of Falls;Left Hip Replacement;Assistive Device;Muscular Weakness;Deconditioning;Back Problems;Joint Problems   DJD, chronic knee pain      6 Minute Walk: 6 Minute Walk    Row Name 08/07/18 1618         6 Minute Walk   Phase  Initial     Distance  555 feet     Walk Time  4.9 minutes     # of Rest Breaks  1 1:06     MPH  1.29     METS  1.07     RPE  15     Perceived Dyspnea   4     VO2 Peak  3.75     Symptoms  Yes (comment)     Comments  SOB, using rollator for support      Resting HR  79 bpm     Resting BP  146/64     Resting Oxygen Saturation   90 %     Exercise Oxygen Saturation  during 6 min walk  87 %     Max Ex. HR  124 bpm     Max Ex. BP  156/64     2 Minute Post BP  150/56       Interval HR   1 Minute HR  110     2 Minute HR  117     3 Minute HR  121     4 Minute HR  106     5 Minute HR  112     6 Minute HR  124     2 Minute Post HR  100     Interval Heart Rate?  Yes       Interval Oxygen    Interval Oxygen?  Yes     Baseline Oxygen Saturation %  90 %     1 Minute Oxygen Saturation %  89 %     1 Minute Liters of Oxygen  8 L continuous     2 Minute Oxygen Saturation %  91 %     2 Minute Liters of Oxygen  8 L     3 Minute Oxygen Saturation %  89 % rest break 2:51-4:07     3 Minute Liters of Oxygen  8 L     4 Minute Oxygen Saturation %  92 %     4 Minute Liters of Oxygen  8 L     5 Minute Oxygen Saturation %  95 %     5 Minute Liters of Oxygen  8 L     6 Minute Oxygen Saturation %  89 % 87% immediately post     6 Minute Liters of Oxygen  8 L     2 Minute Post Oxygen Saturation %  96 %     2 Minute Post Liters of Oxygen  8 L       Oxygen Initial Assessment: Oxygen Initial Assessment - 08/07/18 1520      Home Oxygen   Home Oxygen Device  Home Concentrator;E-Tanks    Sleep Oxygen Prescription  BiPAP;Continuous    Liters per minute  3    Home Exercise Oxygen Prescription  Continuous    Liters per minute  3    Home at Rest Exercise Oxygen Prescription  Continuous    Liters per minute  3    Compliance with Home Oxygen Use  Yes      Initial 6 min Walk   Oxygen Used  Continuous;E-Tanks    Liters per minute  3      Program Oxygen Prescription   Program Oxygen Prescription  E-Tanks;Continuous    Liters per minute  8      Intervention   Short Term Goals  To learn and exhibit compliance with exercise, home and travel O2 prescription;To learn and understand importance of monitoring SPO2 with pulse oximeter and demonstrate accurate use of the pulse oximeter.;To learn and understand importance of maintaining oxygen saturations>88%;To learn and demonstrate proper pursed lip breathing techniques or other breathing techniques.;To learn and demonstrate proper use of respiratory medications    Long  Term Goals  Exhibits compliance with exercise, home and travel O2 prescription;Verbalizes importance of monitoring SPO2 with pulse oximeter and return demonstration;Maintenance of O2  saturations>88%;Exhibits proper breathing techniques, such as pursed lip breathing or other method taught during program session;Compliance with respiratory medication;Demonstrates proper use of MDI's       Oxygen Re-Evaluation: Oxygen Re-Evaluation    Row Name 08/17/18 1023 09/10/18 1023 09/24/18 1025 11/07/18 1417 11/19/18 1320     Program Oxygen Prescription   Program Oxygen Prescription  -  E-Tanks;Continuous  E-Tanks;Continuous  E-Tanks;Continuous  Continuous;E-Tanks   Liters per minute  -  _0 Comments  -  patient has been doing very well on 4 liters of oxygen while exercising.  -  -  -     Home Oxygen   Home Oxygen Device  -  Home Concentrator;E-Tanks  Home Concentrator;E-Tanks  Home Concentrator;E-Tanks  Portable Concentrator   Sleep Oxygen Prescription  -  BiPAP;Continuous  BiPAP;Continuous  BiPAP;Continuous  BiPAP   Liters per minute  -  _1 Home Exercise Oxygen Prescription  -  Continuous  Continuous  Continuous  Continuous   Liters per minute  -  _2 Home at Rest Exercise Oxygen Prescription  -  Continuous  Continuous  Continuous  Continuous   Liters per minute  -  _3 Compliance with Home Oxygen Use  -  Yes  Yes  Yes  Yes     Goals/Expected Outcomes   Short Term Goals  To learn and understand importance of monitoring SPO2 with pulse oximeter and demonstrate accurate use of the pulse oximeter.;To learn and understand importance of maintaining oxygen saturations>88%;To learn and demonstrate proper pursed lip breathing techniques or other  breathing techniques.  To learn and exhibit compliance with exercise, home and travel O2 prescription;To learn and understand importance of monitoring SPO2 with pulse oximeter and demonstrate accurate use of the pulse oximeter.;To learn and understand importance of maintaining oxygen saturations>88%;To learn and demonstrate proper pursed lip breathing techniques or other breathing techniques.;To learn and  demonstrate proper use of respiratory medications  To learn and exhibit compliance with exercise, home and travel O2 prescription;To learn and understand importance of monitoring SPO2 with pulse oximeter and demonstrate accurate use of the pulse oximeter.;To learn and understand importance of maintaining oxygen saturations>88%;To learn and demonstrate proper pursed lip breathing techniques or other breathing techniques.;To learn and demonstrate proper use of respiratory medications  To learn and exhibit compliance with exercise, home and travel O2 prescription;To learn and understand importance of monitoring SPO2 with pulse oximeter and demonstrate accurate use of the pulse oximeter.;To learn and understand importance of maintaining oxygen saturations>88%;To learn and demonstrate proper pursed lip breathing techniques or other breathing techniques.;To learn and demonstrate proper use of respiratory medications  -   Long  Term Goals  Verbalizes importance of monitoring SPO2 with pulse oximeter and return demonstration;Maintenance of O2 saturations>88%;Exhibits proper breathing techniques, such as pursed lip breathing or other method taught during program session  Exhibits compliance with exercise, home and travel O2 prescription;Verbalizes importance of monitoring SPO2 with pulse oximeter and return demonstration;Maintenance of O2 saturations>88%;Exhibits proper breathing techniques, such as pursed lip breathing or other method taught during program session;Compliance with respiratory medication;Demonstrates proper use of MDI's  Exhibits compliance with exercise, home and travel O2 prescription;Verbalizes importance of monitoring SPO2 with pulse oximeter and return demonstration;Maintenance of O2 saturations>88%;Exhibits proper breathing techniques, such as pursed lip breathing or other method taught during program session;Compliance with respiratory medication;Demonstrates proper use of MDI's  Exhibits  compliance with exercise, home and travel O2 prescription;Verbalizes importance of monitoring SPO2 with pulse oximeter and return demonstration;Maintenance of O2 saturations>88%;Exhibits proper breathing techniques, such as pursed lip breathing or other method taught during program session;Compliance with respiratory medication;Demonstrates proper use of MDI's  -   Comments  Reviewed PLB technique with pt.  Talked about how it work and it's important to maintaining his exercise saturations.    Patient is doing well with PLB on exertion. Ricky Johnston is able to walk further distances now since the start of the program. He is doing well on 4 liters of oxygen instead of 8 liters for exercise. His oxygen is is above 88 percent when exercising.  Patient is checking his oxygen at home routinely. He knows he needs to be 88 percent and above. He is taking his breathing medications properly. He wants to try to get off oxygen completly.  Patient states that he is doing well with his oxygen at home. He has liquid oxygen now at home and the company and his doctor have told him he is supposed to use more. He has had to use 6 liters of oxygen when on the treadmill but is able to use 4 liters when he is on sit down equipment with no desaturation issues. Patient states that he is supposed to use 12 liters of oxygen when he is at home. Patients oxygen does very well when he is on 4 to 6 liters when exercising.  -   Goals/Expected Outcomes  Short: Become more profiecient at using PLB.   Long: Become independent at using PLB.  Short: check oxygen at home more on exertion. Long: reduce oxygen below 4 liters for exercise.  Short: move down to 3 liters for sit down equipment. Long: get off oxygen for exercise.  Short: ask his doctor why he should be on 12 liters. Long: keep oxygen saturations above 88 percent with less oxygen.  -      Oxygen Discharge (Final Oxygen Re-Evaluation): Oxygen Re-Evaluation - 11/19/18 1320      Program  Oxygen Prescription   Program Oxygen Prescription  Continuous;E-Tanks    Liters per minute  4      Home Oxygen   Home Oxygen Device  Portable Concentrator    Sleep Oxygen Prescription  BiPAP    Liters per minute  3    Home Exercise Oxygen Prescription  Continuous    Liters per minute  3    Home at Rest Exercise Oxygen Prescription  Continuous    Liters per minute  3    Compliance with Home Oxygen Use  Yes       Initial Exercise Prescription: Initial Exercise Prescription - 08/07/18 1600      Date of Initial Exercise RX and Referring Provider   Date  08/07/18    Referring Provider  Suzie Portela MD   [facility]     Oxygen   Oxygen  Continuous    Liters  8-10      Treadmill   MPH  1    Grade  0    Minutes  15    METs  1.77      NuStep   Level  1    SPM  80    Minutes  15    METs  1.7      Biostep-RELP   Level  1    SPM  60    Minutes  15    METs  2      Prescription Details   Frequency (times per week)  3    Duration  Progress to 45 minutes of aerobic exercise without signs/symptoms of physical distress      Intensity   THRR 40-80% of Max Heartrate  109-138    Ratings of Perceived Exertion  11-13    Perceived Dyspnea  0-4      Progression   Progression  Continue to progress workloads to maintain intensity without signs/symptoms of physical distress.      Resistance Training   Training Prescription  Yes    Weight  4 lbs    Reps  10-15       Perform Capillary Blood Glucose checks as needed.  Exercise Prescription Changes: Exercise Prescription Changes    Row Name 08/07/18 1600 08/22/18 1200 09/05/18 1100 09/19/18 1200 09/24/18 1000     Response to Exercise   Blood Pressure (Admit)  146/64  124/64  134/68  142/68  -   Blood Pressure (Exercise)  156/64  142/64  -  -  -   Blood Pressure (Exit)  150/56  126/68  128/60  130/82  -   Heart Rate (Admit)  79 bpm  104 bpm  110 bpm  94 bpm  -   Heart Rate (Exercise)  124 bpm  115 bpm  114 bpm  128 bpm  -    Heart Rate (Exit)  94 bpm  101 bpm  110 bpm  116 bpm  -   Oxygen Saturation (Admit)  90 %  94 %  89 %  94 %  -   Oxygen Saturation (Exercise)  87 %  95 %  88 %  86 %  -  Oxygen Saturation (Exit)  97 %  92 %  88 %  94 %  -   Rating of Perceived Exertion (Exercise)  _0 -   Perceived Dyspnea (Exercise)  4  0  1  1  -   Symptoms  SOB  -  -  -  -   Comments  walk test results  -  -  -  -   Duration  -  Progress to 45 minutes of aerobic exercise without signs/symptoms of physical distress  Progress to 45 minutes of aerobic exercise without signs/symptoms of physical distress  Progress to 45 minutes of aerobic exercise without signs/symptoms of physical distress  Progress to 45 minutes of aerobic exercise without signs/symptoms of physical distress   Intensity  -  THRR unchanged  THRR unchanged  THRR unchanged  THRR unchanged     Progression   Progression  -  Continue to progress workloads to maintain intensity without signs/symptoms of physical distress.  Continue to progress workloads to maintain intensity without signs/symptoms of physical distress.  Continue to progress workloads to maintain intensity without signs/symptoms of physical distress.  Continue to progress workloads to maintain intensity without signs/symptoms of physical distress.   Average METs  -  1.76  1.8  1.9  1.9     Resistance Training   Training Prescription  -  Yes  Yes  Yes  Yes   Weight  -  4 lb  4 lb  4 lb  4 lb   Reps  -  10-15  10-15  10-15  10-15     Interval Training   Interval Training  -  -  No  -  -     Treadmill   MPH  -  -  1  -  -   Grade  -  -  0  -  -   Minutes  -  -  15  -  -   METs  -  -  1.77  -  -     NuStep   Level  -  -  _1 SPM  -  -  80  80  80   Minutes  -  -  _2 METs  -  -  1._3 Biostep-RELP   Level  -  -  -  3  3   SPM  -  -  -  50  50   Minutes  -  -  -  15  15   METs  -  -  -  2  2     Home Exercise Plan   Plans to continue exercise at  -  -   -  -  Home (comment) walking at home   Frequency  -  -  -  -  Add 2 additional days to program exercise sessions.   Initial Home Exercises Provided  -  -  -  -  09/24/18   Row Name 10/03/18 1200 10/17/18 1200 10/31/18 1200 11/13/18 1100 11/27/18 1200     Response to Exercise   Blood Pressure (Admit)  130/64  128/70  130/62  144/56  142/82   Blood Pressure (Exit)  132/68  126/64  122/70  138/60  122/70   Heart Rate (Admit)  114 bpm  110 bpm  104 bpm  104  bpm  94 bpm   Heart Rate (Exercise)  113 bpm  137 bpm  125 bpm  107 bpm  116 bpm   Heart Rate (Exit)  64 bpm  111 bpm  93 bpm  97 bpm  87 bpm   Oxygen Saturation (Admit)  92 %  89 %  89 %  91 %  93 %   Oxygen Saturation (Exercise)  92 %  86 % inc to 6L - rested til above 90  88 %  85 %  88 %   Oxygen Saturation (Exit)  96 %  94 %  94 %  91 %  93 %   Rating of Perceived Exertion (Exercise)  _0 Perceived Dyspnea (Exercise)  _1 Duration  Continue with 45 min of aerobic exercise without signs/symptoms of physical distress.  Continue with 45 min of aerobic exercise without signs/symptoms of physical distress.  Continue with 45 min of aerobic exercise without signs/symptoms of physical distress.  Continue with 45 min of aerobic exercise without signs/symptoms of physical distress.  Continue with 45 min of aerobic exercise without signs/symptoms of physical distress.   Intensity  THRR unchanged  THRR unchanged  THRR unchanged  THRR unchanged  THRR unchanged     Progression   Progression  Continue to progress workloads to maintain intensity without signs/symptoms of physical distress.  Continue to progress workloads to maintain intensity without signs/symptoms of physical distress.  Continue to progress workloads to maintain intensity without signs/symptoms of physical distress.  Continue to progress workloads to maintain intensity without signs/symptoms of physical distress.  Continue to progress workloads to maintain  intensity without signs/symptoms of physical distress.   Average METs  1.96  2._2 Resistance Training   Training Prescription  Yes  Yes  Yes  Yes  Yes   Weight  4 lb  4 lb  4 lb  4 lb  4 lb   Reps  10-15  10-15  10-15  10-15  10-15     Interval Training   Interval Training  No  No  No  No  No     Treadmill   MPH  -  _3 Grade  -  0  0  0  0   Minutes  -  _4 METs  -  1.77  1.77  1.77  1.77     NuStep   Level  4  -  _5 SPM  80  -  80  80  80   Minutes  15  -  _6 METs  2.3  -  2  2  2.5     Biostep-RELP   Level  _7 SPM  50  50  50  50  50   Minutes  _8 METs  _9 Home Exercise Plan   Plans to continue exercise at  Home (comment) walking at home  Home (comment) walking at home  Home (comment) walking at home  Home (comment) walking at home  Home (  comment) walking at home   Frequency  Add 2 additional days to program exercise sessions.  Add 2 additional days to program exercise sessions.  Add 2 additional days to program exercise sessions.  Add 2 additional days to program exercise sessions.  Add 2 additional days to program exercise sessions.   Initial Home Exercises Provided  09/24/18  09/24/18  09/24/18  09/24/18  -      Exercise Comments: Exercise Comments    Row Name 08/17/18 1021           Exercise Comments   First full day of exercise!  Patient was oriented to gym and equipment including functions, settings, policies, and procedures.  Patient's individual exercise prescription and treatment plan were reviewed.  All starting workloads were established based on the results of the 6 minute walk test done at initial orientation visit.  The plan for exercise progression was also introduced and progression will be customized based on patient's performance and goals          Exercise Goals and Review: Exercise Goals    Row Name 08/07/18 1624             Exercise Goals    Increase Physical Activity  Yes       Intervention  Provide advice, education, support and counseling about physical activity/exercise needs.;Develop an individualized exercise prescription for aerobic and resistive training based on initial evaluation findings, risk stratification, comorbidities and participant's personal goals.       Expected Outcomes  Short Term: Attend rehab on a regular basis to increase amount of physical activity.;Long Term: Add in home exercise to make exercise part of routine and to increase amount of physical activity.;Long Term: Exercising regularly at least 3-5 days a week.       Increase Strength and Stamina  Yes       Intervention  Provide advice, education, support and counseling about physical activity/exercise needs.;Develop an individualized exercise prescription for aerobic and resistive training based on initial evaluation findings, risk stratification, comorbidities and participant's personal goals.       Expected Outcomes  Short Term: Increase workloads from initial exercise prescription for resistance, speed, and METs.;Short Term: Perform resistance training exercises routinely during rehab and add in resistance training at home;Long Term: Improve cardiorespiratory fitness, muscular endurance and strength as measured by increased METs and functional capacity (6MWT)       Able to understand and use rate of perceived exertion (RPE) scale  Yes       Intervention  Provide education and explanation on how to use RPE scale       Expected Outcomes  Short Term: Able to use RPE daily in rehab to express subjective intensity level;Long Term:  Able to use RPE to guide intensity level when exercising independently       Able to understand and use Dyspnea scale  Yes       Intervention  Provide education and explanation on how to use Dyspnea scale       Expected Outcomes  Short Term: Able to use Dyspnea scale daily in rehab to express subjective sense of shortness of breath  during exertion;Long Term: Able to use Dyspnea scale to guide intensity level when exercising independently       Knowledge and understanding of Target Heart Rate Range (THRR)  Yes       Intervention  Provide education and explanation of THRR including how the numbers were predicted and where they are located for reference  Expected Outcomes  Short Term: Able to state/look up THRR;Short Term: Able to use daily as guideline for intensity in rehab;Long Term: Able to use THRR to govern intensity when exercising independently       Able to check pulse independently  Yes       Intervention  Provide education and demonstration on how to check pulse in carotid and radial arteries.;Review the importance of being able to check your own pulse for safety during independent exercise       Expected Outcomes  Short Term: Able to explain why pulse checking is important during independent exercise;Long Term: Able to check pulse independently and accurately       Understanding of Exercise Prescription  Yes       Intervention  Provide education, explanation, and written materials on patient's individual exercise prescription       Expected Outcomes  Short Term: Able to explain program exercise prescription;Long Term: Able to explain home exercise prescription to exercise independently          Exercise Goals Re-Evaluation : Exercise Goals Re-Evaluation    Row Name 08/17/18 1022 09/05/18 1150 09/19/18 1258 09/24/18 1057 10/03/18 1253     Exercise Goal Re-Evaluation   Exercise Goals Review  Increase Physical Activity;Increase Strength and Stamina;Able to understand and use rate of perceived exertion (RPE) scale;Able to understand and use Dyspnea scale;Able to check pulse independently;Understanding of Exercise Prescription  Increase Physical Activity;Increase Strength and Stamina;Able to understand and use rate of perceived exertion (RPE) scale;Able to understand and use Dyspnea scale  Increase Physical  Activity;Increase Strength and Stamina;Able to understand and use rate of perceived exertion (RPE) scale;Able to understand and use Dyspnea scale  Increase Physical Activity;Increase Strength and Stamina;Able to understand and use rate of perceived exertion (RPE) scale;Able to understand and use Dyspnea scale;Knowledge and understanding of Target Heart Rate Range (THRR);Able to check pulse independently;Understanding of Exercise Prescription  Increase Physical Activity;Increase Strength and Stamina;Able to understand and use rate of perceived exertion (RPE) scale;Able to understand and use Dyspnea scale;Understanding of Exercise Prescription   Comments  Reviewed RPE scale, THR and program prescription with pt today.  Pt voiced understanding and was given a copy of goals to take home.   Ricky Johnston has just started LW.  He needs to attend consistently to see progress.  He tolerates exercise well when he attends.  Ricky Johnston last session was 10/14.  Regular attendnace is needed for progression  Reviewed home exercise with pt today.  Pt plans to walk at home for exercise.  Reviewed THR, pulse, RPE, sign and symptoms, NTG use, and when to call 911 or MD.  Also discussed weather considerations and indoor options.  Pt voiced understanding.  [place] is up to level 10 on Biostep.  Staff will ask if hed like to change to another machine.  He has been consistent attending the past 2 weeks.   Expected Outcomes  Short: Use RPE daily to regulate intensity. Long: Follow program prescription in THR.  Short - attend consistently Long - increase MET level  Short - attend consistently Long - increase MET level  Short: Add 1-2 days of walking at home on days Ricky Johnston does not come to rehab. Long: Become independent with exercise program.   Short - change Biostep to more challenging piece Long - increase MET level   Row Name 10/17/18 1225 10/31/18 1235 11/27/18 1247         Exercise Goal Re-Evaluation   Exercise Goals Review  Increase Physical  Activity;Increase Strength and Stamina;Able to understand and use rate of perceived exertion (RPE) scale;Able to understand and use Dyspnea scale;Knowledge and understanding of Target Heart Rate Range (THRR)  Increase Physical Activity;Increase Strength and Stamina;Able to understand and use rate of perceived exertion (RPE) scale;Able to understand and use Dyspnea scale;Knowledge and understanding of Target Heart Rate Range (THRR);Understanding of Exercise Prescription  Increase Physical Activity;Increase Strength and Stamina;Able to understand and use rate of perceived exertion (RPE) scale;Able to understand and use Dyspnea scale;Knowledge and understanding of Target Heart Rate Range (THRR);Understanding of Exercise Prescription     Comments  Ricky Johnston has shown good progress and has improved MET level.  He has attended consistently this month.  Ricky Johnston has increased overall endurance since beginning LW.  He has increased resistance on machines and is able to complete 45 minutes continuously.  Ricky Johnston continues to progress well and is down to 292 lb.   He has a procedure for another medical issue coming up and will keep staff informed of attendance.     Expected Outcomes  Short - work on endurance on TM Long - increase MET level  Short - attend 3 times per week Long - increase MET level  Short - continue to attend LW  Long - increase overall MET level        Discharge Exercise Prescription (Final Exercise Prescription Changes): Exercise Prescription Changes - 11/27/18 1200      Response to Exercise   Blood Pressure (Admit)  142/82    Blood Pressure (Exit)  122/70    Heart Rate (Admit)  94 bpm    Heart Rate (Exercise)  116 bpm    Heart Rate (Exit)  87 bpm    Oxygen Saturation (Admit)  93 %    Oxygen Saturation (Exercise)  88 %    Oxygen Saturation (Exit)  93 %    Rating of Perceived Exertion (Exercise)  13    Perceived Dyspnea (Exercise)  2    Duration  Continue with 45 min of aerobic exercise without  signs/symptoms of physical distress.    Intensity  THRR unchanged      Progression   Progression  Continue to progress workloads to maintain intensity without signs/symptoms of physical distress.    Average METs  2      Resistance Training   Training Prescription  Yes    Weight  4 lb    Reps  10-15      Interval Training   Interval Training  No      Treadmill   MPH  1    Grade  0    Minutes  15    METs  1.77      NuStep   Level  3    SPM  80    Minutes  15    METs  2.5      Biostep-RELP   Level  10    SPM  50    Minutes  15    METs  2      Home Exercise Plan   Plans to continue exercise at  Home (comment)   walking at home   Frequency  Add 2 additional days to program exercise sessions.       Nutrition:  Target Goals: Understanding of nutrition guidelines, daily intake of sodium <1582m, cholesterol <2058m calories 30% from fat and 7% or less from saturated fats, daily to have 5 or more servings of fruits and vegetables.  Biometrics: Pre Biometrics - 08/07/18 1625  Pre Biometrics   Height  5' 7.6" (1.717 m)    Weight  (!) 306 lb 14.4 oz (139.2 kg)    Waist Circumference  47.5 inches    Hip Circumference  51 inches    Waist to Hip Ratio  0.93 %    BMI (Calculated)  47.22    Single Leg Stand  0 seconds        Nutrition Therapy Plan and Nutrition Goals: Nutrition Therapy & Goals - 08/29/18 1203      Nutrition Therapy   Diet  DM    Drug/Food Interactions  Statins/Certain Fruits    Protein (specify units)  12oz    Fiber  35 grams    Whole Grain Foods  3 servings   chooses whole grain breads   Saturated Fats  15 max. grams    Fruits and Vegetables  6 servings/day   8 ideal, eats fruits and vegetables daily   Sodium  1500 grams      Personal Nutrition Goals   Nutrition Goal  Practice eating on a consistent schedule each day. For times when you are not hungry for a meal, it may be helpful to at least have a snack with protein + a carb serving to  best manage BG control. This is especially important in the evenings    Personal Goal #2  Add a protein and/or fiber source to meals that are carb-heavy such as cereal to prevent hyperglycemia    Personal Goal #3  Eat things like chocolate in moderation and choose sugar-free varieties when possible    Comments  He does not follow a diabetic diet but does try to monitor CHO intake and not eat sweets other than chocolate (sometimes SF). he is lactose intolerant and tries not to add salt to foods, though does eat fried foods. Reports BG control over the past month to be fair-poor. Eats whole wheat bread, a variety of fruits and vegetables, and nuts. Chooses SF beverages. Reports to skip meals occasionally d/t lack of hunger      Intervention Plan   Intervention  Prescribe, educate and counsel regarding individualized specific dietary modifications aiming towards targeted core components such as weight, hypertension, lipid management, diabetes, heart failure and other comorbidities.    Expected Outcomes  Short Term Goal: Understand basic principles of dietary content, such as calories, fat, sodium, cholesterol and nutrients.;Short Term Goal: A plan has been developed with personal nutrition goals set during dietitian appointment.;Long Term Goal: Adherence to prescribed nutrition plan.       Nutrition Assessments: Nutrition Assessments - 08/07/18 1602      MEDFICTS Scores   Pre Score  46       Nutrition Goals Re-Evaluation: Nutrition Goals Re-Evaluation    Row Name 08/29/18 1234 10/01/18 1135 11/12/18 1038         Goals   Nutrition Goal  Practice eating on a consistent schedule each day. For times when you are not hungry for a meal, it may be helpful to at least have a snack with protein + a carb serving to best manage BG control. This is especially important in the evenings  Practice eating on a consistent schedule each day; add a protein and/or fiber source at meals; eat things like chocolate  in moderation and choose sugar free varieties when possible  Practice eating on a consistent schedule each day; add a protein and/or fiber source; eat things like chocolate in moderation     Comment  He skips meals  occasionally d/t lack of hunger. Reports to have a small snack like 1/2 pack of PB crackers rather than a meal sometimes but may also eat nothing. Last night he skipped dinner and did not eat again until before class today  Pt's wife has been helpful in facilitating new eating habits/ schedule for pt. He feel that he is now eating on a more consistent schedule each day and is offered at least a protein at meal times. This morning for breakfast he had an egg, sausage and cheese sandwich rather than cereal. His wife has also stopped buying chocolate and other sweets  With the help of his wife he has been able to eat on a more consistent schedule and is eating more fruit. He is not snacking as much and has been including protein sources at meal times     Expected Outcome  He will eat a snack that includes a carb source + a protein source in place of meals if not hungry to avoid going long periods of time without eating to improve BG control. He will do his best to eat on a consistent schedule each day  He will continue to eat 3 meals/day on a regular schedule with snacks as needed and include protien/fiber rich options. He will find alternatives for high-sugar snacks such as chocolate, as he does not think he will be able to cut these foods out completely.  He will continue to eat on a regular/ consistent schedule and to include sources of lean protein regularly. He will continue to eat more fruit and to limit non-nutritious snacks. Long term goal: eat more vegetables       Personal Goal #2 Re-Evaluation   Personal Goal #2  Add a protein and/or fiber source to meals that are carb-heavy such as cereal to prevent hyperglycemia  -  -       Personal Goal #3 Re-Evaluation   Personal Goal #3  Eat things  like chocolate in moderation and choose sugar free varieties when possible  -  -        Nutrition Goals Discharge (Final Nutrition Goals Re-Evaluation): Nutrition Goals Re-Evaluation - 11/12/18 1038      Goals   Nutrition Goal  Practice eating on a consistent schedule each day; add a protein and/or fiber source; eat things like chocolate in moderation    Comment  With the help of his wife he has been able to eat on a more consistent schedule and is eating more fruit. He is not snacking as much and has been including protein sources at meal times    Expected Outcome  He will continue to eat on a regular/ consistent schedule and to include sources of lean protein regularly. He will continue to eat more fruit and to limit non-nutritious snacks. Long term goal: eat more vegetables       Psychosocial: Target Goals: Acknowledge presence or absence of significant depression and/or stress, maximize coping skills, provide positive support system. Participant is able to verbalize types and ability to use techniques and skills needed for reducing stress and depression.   Initial Review & Psychosocial Screening: Initial Psych Review & Screening - 08/07/18 1523      Initial Review   Current issues with  Current Sleep Concerns;Current Stress Concerns    Source of Stress Concerns  Chronic Illness;Unable to perform yard/household activities    Comments  The patient is not mobile and his mother is 31 and is making life stressful for him.  Family Dynamics   Good Support System?  Yes    Comments  He can look to his wifes family for support.      Barriers   Psychosocial barriers to participate in program  There are no identifiable barriers or psychosocial needs.;The patient should benefit from training in stress management and relaxation.      Screening Interventions   Interventions  Encouraged to exercise;Program counselor consult;Provide feedback about the scores to participant;To provide support  and resources with identified psychosocial needs    Expected Outcomes  Short Term goal: Utilizing psychosocial counselor, staff and physician to assist with identification of specific Stressors or current issues interfering with healing process. Setting desired goal for each stressor or current issue identified.;Long Term Goal: Stressors or current issues are controlled or eliminated.;Short Term goal: Identification and review with participant of any Quality of Life or Depression concerns found by scoring the questionnaire.;Long Term goal: The participant improves quality of Life and PHQ9 Scores as seen by post scores and/or verbalization of changes       Quality of Life Scores:  Scores of 19 and below usually indicate a poorer quality of life in these areas.  A difference of  2-3 points is a clinically meaningful difference.  A difference of 2-3 points in the total score of the Quality of Life Index has been associated with significant improvement in overall quality of life, self-image, physical symptoms, and general health in studies assessing change in quality of life.  PHQ-9: Recent Review Flowsheet Data    Depression screen [facility] 2/9 09/28/2018 08/07/2018   Decreased Interest 0 1   Down, Depressed, Hopeless 0 2   PHQ - 2 Score 0 3   Altered sleeping 0 2   Tired, decreased energy 1 2   Change in appetite 0 0   Feeling bad or failure about yourself  0 0   Trouble concentrating 0 0   Moving slowly or fidgety/restless 0 0   Suicidal thoughts 0 0   PHQ-9 Score 1 7   Difficult doing work/chores Not difficult at all Very difficult     Interpretation of Total Score  Total Score Depression Severity:  1-4 = Minimal depression, 5-9 = Mild depression, 10-14 = Moderate depression, 15-19 = Moderately severe depression, 20-27 = Severe depression   Psychosocial Evaluation and Intervention: Psychosocial Evaluation - 08/29/18 1131      Psychosocial Evaluation & Interventions   Interventions  Stress  management education;Relaxation education;Encouraged to exercise with the program and follow exercise prescription    Comments  Counselor met with Ricky Johnston) today for initial psychosocial evaluation.  He is a 68 year old who has COPD; as well as diabetes; sleep apnea and a recent cancer survivor.  Ricky Johnston has a strong support system with a spouse of 18 years; (2) step children with positive relationships and (2) half sisters.  Ricky Johnston reports sleeping fairly well in a recliner for approximately 8-9 hours most nights.  He uses a CPAP for this.  He reports his appetite is "too good" and would like to lose some weight.  [place] denies a history of depression or anxiety or any current symptoms and is typically in a positive mood.  However, Ricky Johnston reports his 41 year old mother in [place] is dying and this has been stressful and painful for him - impacting his mood and sleep at times.  Counselor processed this with Ricky Johnston and provided support - offering a grief support group if needed.  Ricky Johnston has goals  to improve his balance and oxygen levels and possibly lose some weight while in this program.     Expected Outcomes  Short:  Ricky Johnston will exercise for his health and mental health as a positive coping strategy.  He will meet with the dietician for a healthy nutrition plan.  Marques will inform this counselor if needs further information/support through this time of grief/loss with his mother's illness.  Long:  Emeric will develop a pattern of healthy lifestyle choices for his health and mental health.      Continue Psychosocial Services   Follow up required by staff       Psychosocial Re-Evaluation: Psychosocial Re-Evaluation    [place] Name 09/24/18 1103 11/07/18 1422 11/12/18 1130         Psychosocial Re-Evaluation   Current issues with  Current Stress Concerns;Current Sleep Concerns  Current Stress Concerns;Current Sleep Concerns  Current Stress Concerns     Comments  Patient is using his BIPAP machine all the time now  and states he is sleeping like a rock. His mom is in bad shape and he will be stressed until she passes. His moms health and memory are going down hill. He gets down about his health and sees how his health has declined. He thinks about the past when he was in the [employer] and it makes him aggravated that he cannot do the things he used to. He wants to get back in shape and be healthy again. Ricky Johnston has a good attitude and is ready to make a change.  Tyhir states things are going very except for his bladder. He states that his doctor found a red dot on bladder and it could be cancer again, They are going to run a biopsy to see if is is malignant. Belen is in high spirits and is willing to do what it takes to get healthier.  Counselor follow up with Ricky Johnston today reporting he has seen some progress in his balance and core strength since coming into this program and is using his cane less as a result.  Counselor commended him for this progress.  He states his mother who is "dying" is about the same and hanging on - which is helpful for Ricky Johnston to cope with this.  He reports having the flu recently but recovered much better than in the past and was pleasantly surprised by this.   Staff will continue to follow with Ricky Johnston - he has a positive mood and is a pleasure to work with.      Expected Outcomes  Short: attend [facility] regularly. Long: maintain exercise to decrease stress.  Short: Attend [facility] stress management education to decrease stress. Long: Maintain exercise Post [facility] to keep stress at a minimum  Short:  Ricky Johnston will continue to exercise and practice positive self-care for his health and to decrease his stress.  Long:  Ricky Johnston will continue to maintain a positive attitude while practicing improved self-care and engaging in a consistent exercise routine.      Interventions  Encouraged to attend Pulmonary Rehabilitation for the exercise  Encouraged to attend Pulmonary Rehabilitation for the exercise  -     Continue  Psychosocial Services   Follow up required by staff  Follow up required by staff  Follow up required by staff       Initial Review   Source of Stress Concerns  Chronic Illness;Unable to perform yard/household activities  -  -        Psychosocial Discharge (Final Psychosocial Re-Evaluation):  Psychosocial Re-Evaluation - 11/12/18 1130      Psychosocial Re-Evaluation   Current issues with  Current Stress Concerns    Comments  Counselor follow up with Ricky Johnston today reporting he has seen some progress in his balance and core strength since coming into this program and is using his cane less as a result.  Counselor commended him for this progress.  He states his mother who is "dying" is about the same and hanging on - which is helpful for Ricky Johnston to cope with this.  He reports having the flu recently but recovered much better than in the past and was pleasantly surprised by this.   Staff will continue to follow with Ricky Johnston - he has a positive mood and is a pleasure to work with.     Expected Outcomes  Short:  Ricky Johnston will continue to exercise and practice positive self-care for his health and to decrease his stress.  Long:  Ricky Johnston will continue to maintain a positive attitude while practicing improved self-care and engaging in a consistent exercise routine.     Continue Psychosocial Services   Follow up required by staff       Education: Education Goals: Education classes will be provided on a weekly basis, covering required topics. Participant will state understanding/return demonstration of topics presented.  Learning Barriers/Preferences: Learning Barriers/Preferences - 08/07/18 1526      Learning Barriers/Preferences   Learning Barriers  Hearing    Learning Preferences  None       Education Topics:  Initial Evaluation Education: - Verbal, written and demonstration of respiratory meds, oximetry and breathing techniques. Instruction on use of nebulizers and MDIs and importance of monitoring MDI  activations.   Pulmonary Rehab from 12/05/2018 in [facility] Pulmonary Rehab  Date  08/07/18  Educator  [facility]  Instruction Review Code  1- Verbalizes Understanding      General Nutrition Guidelines/Fats and Fiber: -Group instruction provided by verbal, written material, models and posters to present the general guidelines for heart healthy nutrition. Gives an explanation and review of dietary fats and fiber.   Pulmonary Rehab from 12/05/2018 in [facility] Cardiac and Pulmonary Rehab  Date  10/03/18  Educator  LB  Instruction Review Code  1- Verbalizes Understanding      Controlling Sodium/Reading Food Labels: -Group verbal and written material supporting the discussion of sodium use in heart healthy nutrition. Review and explanation with models, verbal and written materials for utilization of the food label.   Pulmonary Rehab from 12/05/2018 in [facility] Cardiac and Pulmonary Rehab  Date  10/17/18  Educator  LB  Instruction Review Code  1- Verbalizes Understanding      Exercise Physiology & General Exercise Guidelines: - Group verbal and written instruction with models to review the exercise physiology of the cardiovascular system and associated critical values. Provides general exercise guidelines with specific guidelines to those with heart or lung disease.    Aerobic Exercise & Resistance Training: - Gives group verbal and written instruction on the various components of exercise. Focuses on aerobic and resistive training programs and the benefits of this training and how to safely progress through these programs.   Flexibility, Balance, Mind/Body Relaxation: Provides group verbal/written instruction on the benefits of flexibility and balance training, including mind/body exercise modes such as yoga, pilates and tai chi.  Demonstration and skill practice provided.   Stress and Anxiety: - Provides group verbal and written instruction about the health risks of elevated stress and causes of  high stress.  Discuss the correlation between heart/lung disease and anxiety and treatment options. Review healthy ways to manage with stress and anxiety.   Pulmonary Rehab from 12/05/2018 in [facility] Cardiac and Pulmonary Rehab  Date  10/10/18  Educator  [facility]  Instruction Review Code  1- Verbalizes Understanding      Depression: - Provides group verbal and written instruction on the correlation between heart/lung disease and depressed mood, treatment options, and the stigmas associated with seeking treatment.   Exercise & Equipment Safety: - Individual verbal instruction and demonstration of equipment use and safety with use of the equipment.   Pulmonary Rehab from 12/05/2018 in [facility] Cardiac and Pulmonary Rehab  Date  08/07/18  Educator  [facility]  Instruction Review Code  1- Verbalizes Understanding      Infection Prevention: - Provides verbal and written material to individual with discussion of infection control including proper hand washing and proper equipment cleaning during exercise session.   Pulmonary Rehab from 12/05/2018 in [facility] Cardiac and Pulmonary Rehab  Date  08/07/18  Educator  [facility]  Instruction Review Code  1- Verbalizes Understanding      Falls Prevention: - Provides verbal and written material to individual with discussion of falls prevention and safety.   Pulmonary Rehab from 12/05/2018 in [facility] Cardiac and Pulmonary Rehab  Date  08/07/18  Educator  [facility]  Instruction Review Code  1- Verbalizes Understanding      Diabetes: - Individual verbal and written instruction to review signs/symptoms of diabetes, desired ranges of glucose level fasting, after meals and with exercise. Advice that pre and post exercise glucose checks will be done for 3 sessions at entry of program.   Chronic Lung Diseases: - Group verbal and written instruction to review updates, respiratory medications, advancements in procedures and treatments. Discuss use of supplemental oxygen including available  portable oxygen systems, continuous and intermittent flow rates, concentrators, personal use and safety guidelines. Review proper use of inhaler and spacers. Provide informative websites for self-education.    Energy Conservation: - Provide group verbal and written instruction for methods to conserve energy, plan and organize activities. Instruct on pacing techniques, use of adaptive equipment and posture/positioning to relieve shortness of breath.   Pulmonary Rehab from 12/05/2018 in [facility] Cardiac and Pulmonary Rehab  Date  09/26/18  Educator  [facility]  Instruction Review Code  1- Verbalizes Understanding      Triggers and Exacerbations: - Group verbal and written instruction to review types of environmental triggers and ways to prevent exacerbations. Discuss weather changes, air quality and the benefits of nasal washing. Review warning signs and symptoms to help prevent infections. Discuss techniques for effective airway clearance, coughing, and vibrations.   Pulmonary Rehab from 12/05/2018 in [facility] Cardiac and Pulmonary Rehab  Date  08/29/18  Educator  [facility]  Instruction Review Code  1- Verbalizes Understanding      AED/CPR: - Group verbal and written instruction with the use of models to demonstrate the basic use of the AED with the basic ABC's of resuscitation.   Anatomy and Physiology of the Lungs: - Group verbal and written instruction with the use of models to provide basic lung anatomy and physiology related to function, structure and complications of lung disease.   Anatomy & Physiology of the Heart: - Group verbal and written instruction and models provide basic cardiac anatomy and physiology, with the coronary electrical and arterial systems. Review of Valvular disease and Heart Failure   Pulmonary Rehab from 12/05/2018 in [facility] Cardiac and Pulmonary Rehab  Date  11/30/18  Educator  [facility]  Instruction Review Code  5- Refused Teaching      Cardiac Medications: - Group verbal and written  instruction to review commonly prescribed medications for heart disease. Reviews the medication, class of the drug, and side effects.   Know Your Numbers and Risk Factors: -Group verbal and written instruction about important numbers in your health.  Discussion of what are risk factors and how they play a role in the disease process.  Review of Cholesterol, Blood Pressure, Diabetes, and BMI and the role they play in your overall health.   Pulmonary Rehab from 12/05/2018 in [facility] Cardiac and Pulmonary Rehab  Date  11/14/18  Educator  [facility]  Instruction Review Code  1- Verbalizes Understanding      Sleep Hygiene: -Provides group verbal and written instruction about how sleep can affect your health.  Define sleep hygiene, discuss sleep cycles and impact of sleep habits. Review good sleep hygiene tips.    Pulmonary Rehab from 12/05/2018 in [facility] Cardiac and Pulmonary Rehab  Date  11/07/18  Educator  [facility]  Instruction Review Code  1- Verbalizes Understanding      Other: -Provides group and verbal instruction on various topics (see comments)    Knowledge Questionnaire Score: Knowledge Questionnaire Score - 08/07/18 1526      Knowledge Questionnaire Score   Pre Score  16/18   reviewed with patient       Core Components/Risk Factors/Patient Goals at Admission: Personal Goals and Risk Factors at Admission - 08/07/18 1526      Core Components/Risk Factors/Patient Goals on Admission    Weight Management  Yes;Weight Loss    Intervention  Weight Management: Develop a combined nutrition and exercise program designed to reach desired caloric intake, while maintaining appropriate intake of nutrient and fiber, sodium and fats, and appropriate energy expenditure required for the weight goal.;Weight Management: Provide education and appropriate resources to help participant work on and attain dietary goals.;Weight Management/Obesity: Establish reasonable short term and long term weight goals.;Obesity:  Provide education and appropriate resources to help participant work on and attain dietary goals.    Admit Weight  306 lb 14.4 oz (139.2 kg)    Goal Weight: Short Term  301 lb (136.5 kg)    Goal Weight: Long Term  250 lb (113.4 kg)    Expected Outcomes  Short Term: Continue to assess and modify interventions until short term weight is achieved;Long Term: Adherence to nutrition and physical activity/exercise program aimed toward attainment of established weight goal;Understanding recommendations for meals to include 15-35% energy as protein, 25-35% energy from fat, 35-60% energy from carbohydrates, less than 259m of dietary cholesterol, 20-35 gm of total fiber daily;Understanding of distribution of calorie intake throughout the day with the consumption of 4-5 meals/snacks    Improve shortness of breath with ADL's  Yes    Intervention  Provide education, individualized exercise plan and daily activity instruction to help decrease symptoms of SOB with activities of daily living.    Expected Outcomes  Long Term: Be able to perform more ADLs without symptoms or delay the onset of symptoms;Short Term: Improve cardiorespiratory fitness to achieve a reduction of symptoms when performing ADLs    Diabetes  Yes    Intervention  Provide education about signs/symptoms and action to take for hypo/hyperglycemia.;Provide education about proper nutrition, including hydration, and aerobic/resistive exercise prescription along with prescribed medications to achieve blood glucose in normal ranges: Fasting glucose 65-99 mg/dL    Expected Outcomes  Short Term: Participant verbalizes understanding of the signs/symptoms and immediate care of hyper/hypoglycemia, proper foot care and importance of medication, aerobic/resistive exercise and nutrition plan for blood glucose control.;Long Term: Attainment of HbA1C < 7%.    Hypertension  Yes    Intervention  Provide education on lifestyle modifcations including regular physical  activity/exercise, weight management, moderate sodium restriction and increased consumption of fresh fruit, vegetables, and low fat dairy, alcohol moderation, and smoking cessation.;Monitor prescription use compliance.    Expected Outcomes  Short Term: Continued assessment and intervention until BP is < 140/15m HG in hypertensive participants. < 130/876mHG in hypertensive participants with diabetes, heart failure or chronic kidney disease.;Long Term: Maintenance of blood pressure at goal levels.    Lipids  Yes    Intervention  Provide education and support for participant on nutrition & aerobic/resistive exercise along with prescribed medications to achieve LDL <7046mHDL >59m5m  Expected Outcomes  Short Term: Participant states understanding of desired cholesterol values and is compliant with medications prescribed. Participant is following exercise prescription and nutrition guidelines.;Long Term: Cholesterol controlled with medications as prescribed, with individualized exercise RX and with personalized nutrition plan. Value goals: LDL < 70mg8mL > 40 mg.       Core Components/Risk Factors/Patient Goals Review:  Goals and Risk Factor Review    Row Name 09/10/18 1029 09/28/18 1025 11/07/18 1426 11/19/18 1310       Core Components/Risk Factors/Patient Goals Review   Personal Goals Review  Weight Management/Obesity;Improve shortness of breath with ADL's;Hypertension;Lipids;Diabetes  Weight Management/Obesity;Improve shortness of breath with ADL's;Hypertension;Lipids;Diabetes  Weight Management/Obesity;Improve shortness of breath with ADL's;Hypertension;Lipids;Diabetes  Weight Management/Obesity;Improve shortness of breath with ADL's;Develop more efficient breathing techniques such as purse lipped breathing and diaphragmatic breathing and practicing self-pacing with activity.;Diabetes;Hypertension;Stress    Review  Zakariyah Coadyreached his short term goal of losing 5 pounds. He wants to lose weight  and continue to improve his ADL's. He has been checking his sugar at home and has had good [place] and his wife can see a difference in his ability to do things, He has been checking his sugar in the morning which is lower but higher than he wants it to be. His A1C is 7.9. His fasting blood sugar is 192 on average.  Pauls blood pressure has been improving since the start of the program. He is controlling his blood sugar at home and has yet to check his A1c. Nikai Ala short of breath on the treadmill and is one machine that is most difficult for him. Christos Jancelost 5-10 pounds since the start of the program and wants to keep going.   Dakoda Nnamdirts he is still losing weight - slowly. He is taking meds as directed.  He reports good BP at home and BG is steady.  He walks at home on days not at [facility].  [facility] does report feeling hungry more but eats small portions  He hasnt been sleeping well - he wakes up and has trouble getting back to sleep.  We discussed using tips from Sleep Hygiene education.    Expected Outcomes  Short: continue [facility] to lose more weight. Long: lose another 5 pounds in the next month.  Short: work on lowering fasting blood sugar. Long: have an A1c lower than 7.  Short: Attend [facility] regularly to improve shortness of breath with ADL's. Long: maintain independence with ADL's   Short - continue to exercise and eat heathy  diet Long - reach goal weight and maintain exercise on his own       Core Components/Risk Factors/Patient Goals at Discharge (Final Review):  Goals and Risk Factor Review - 11/19/18 1310      Core Components/Risk Factors/Patient Goals Review   Personal Goals Review  Weight Management/Obesity;Improve shortness of breath with ADL's;Develop more efficient breathing techniques such as purse lipped breathing and diaphragmatic breathing and practicing self-pacing with activity.;Diabetes;Hypertension;Stress    Review  Isaiyah reports he is  still losing weight - slowly. He is taking meds as directed.  He reports good BP at home and BG is steady.  He walks at home on days not at [facility].  He does report feeling hungry more but eats small portions  He hasnt been sleeping well - he wakes up and has trouble getting back to sleep.  We discussed using tips from Sleep Hygiene education.    Expected Outcomes  Short - continue to exercise and eat heathy diet Long - reach goal weight and maintain exercise on his own       ITP Comments: ITP Comments    Row Name 08/07/18 1451 08/20/18 0821 09/17/18 0826 09/17/18 0923 10/15/18 0827   ITP Comments  Medical Evaluation completed. Chart sent for review and changes to Dr. Emily Filbert Director of [place]. Diagnosis can be found in [facility] media patient is a [place] patient  30 day review completed. ITP sent to Dr. Emily Filbert Director of [place]. Continue with ITP unless changes are made by physician  30 day review completed. ITP sent to Dr. Emily Filbert Director of [place]. Continue with ITP unless changes are made by physician.  Marqui states that he hurt is back somehow and will try to return on Wednesday.  30 day review completed. ITP sent to Dr. Emily Filbert Director of [place]. Continue with ITP unless changes are made by physician.   [place] Name 11/12/18 0821 12/10/18 0825         ITP Comments  30 day review completed. ITP sent to Dr. Emily Filbert Director of [place]. Continue with ITP unless changes are made by physician.  30 day review completed. ITP sent to Dr. Emily Filbert Director of [place]. Continue with ITP unless changes are made by physician.         Comments: 30 day review

## 2018-12-17 ENCOUNTER — Encounter: Payer: No Typology Code available for payment source | Admitting: *Deleted

## 2018-12-17 DIAGNOSIS — J449 Chronic obstructive pulmonary disease, unspecified: Secondary | ICD-10-CM | POA: Diagnosis not present

## 2018-12-17 NOTE — Progress Notes (Signed)
Daily Session Note  Patient Details  Name: Ricky Johnston MRN: [id number] Date of Birth: 18-Jan-1951 Referring Provider:     Pulmonary Rehab from 08/07/2018 in [facility] Cardiac and Pulmonary Rehab  Referring Provider  Suzie Portela MD [[place]]      Encounter Date: 12/17/2018  Check In: Session Check In - 12/17/18 1019      Check-In   Supervising physician immediately available to respond to emergencies  [facility] immediately available ER MD    Physician(s)  Dr. Quentin Cornwall and Dr. Jimmye Norman    Location  [facility]-Cardiac & Pulmonary Rehab    Staff Present  Earlean Shawl, BS, ACSM CEP, Exercise Physiologist;Joseph Darrin Nipper, [place], RCEP, CCRP, Exercise Physiologist    Medication changes reported      No    Fall or balance concerns reported     No    Tobacco Cessation  No Change    Warm-up and Cool-down  Performed as group-led instruction    Resistance Training Performed  Yes    VAD Patient?  No    PAD/SET Patient?  No      Pain Assessment   Currently in Pain?  No/denies    Multiple Pain Sites  No          Social History   Tobacco Use  Smoking Status Former Smoker  . Packs/day: 1.00  . Years: 33.00  . Pack years: 33.00  . Types: Cigarettes  . Last attempt to quit: 04/06/2000  . Years since quitting: 18.7  Smokeless Tobacco Never Used  Tobacco Comment   use to chew cigars    Goals Met:  Proper associated with RPD/PD & O2 Sat Independence with exercise equipment Exercise tolerated well No report of cardiac concerns or symptoms Strength training completed today  Goals Unmet:  Not Applicable  Comments: Pt able to follow exercise prescription today without complaint.  Will continue to monitor for progression.    Dr. Emily Filbert is Medical Director for [place] and [facility].

## 2018-12-19 ENCOUNTER — Encounter: Payer: No Typology Code available for payment source | Admitting: *Deleted

## 2018-12-19 DIAGNOSIS — J449 Chronic obstructive pulmonary disease, unspecified: Secondary | ICD-10-CM

## 2018-12-19 NOTE — Progress Notes (Signed)
Daily Session Note  Patient Details  Name: Ricky Johnston MRN: [id number] Date of Birth: [birth year] Referring Provider:     Pulmonary Rehab from 08/07/2018 in [facility] Cardiac and Pulmonary Rehab  Referring Provider  Suzie Portela MD [[facility]]      Encounter Date: 12/19/2018  Check In: Session Check In - 12/19/18 1050      Check-In   Supervising physician immediately available to respond to emergencies  [facility] immediately available ER MD    Physician(s)  Drs. Kipp Laurence    Location  [facility]-Cardiac & Pulmonary Rehab    Staff Present  Alberteen Sam, MA, RCEP, CCRP, Exercise Physiologist;Joseph Foy [place], [place], ACSM CEP, Exercise Physiologist    Medication changes reported      No    Fall or balance concerns reported     No    Warm-up and Cool-down  Performed as group-led instruction    Resistance Training Performed  Yes    VAD Patient?  No    PAD/SET Patient?  No      Pain Assessment   Currently in Pain?  No/denies          Social History   Tobacco Use  Smoking Status Former Smoker  . Packs/day: 1.00  . Years: 33.00  . Pack years: 33.00  . Types: Cigarettes  . Last attempt to quit: 04/06/2000  . Years since quitting: 18.7  Smokeless Tobacco Never Used  Tobacco Comment   use to chew cigars    Goals Met:  Proper associated with RPD/PD & O2 Sat Independence with exercise equipment Using PLB without cueing & demonstrates good technique Exercise tolerated well No report of cardiac concerns or symptoms Strength training completed today  Goals Unmet:  Not Applicable  Comments: Pt able to follow exercise prescription today without complaint.  Will continue to monitor for progression.    Dr. Emily Filbert is Medical Director for [place] and [facility].

## 2018-12-24 ENCOUNTER — Encounter: Payer: No Typology Code available for payment source | Admitting: *Deleted

## 2018-12-24 DIAGNOSIS — J449 Chronic obstructive pulmonary disease, unspecified: Secondary | ICD-10-CM | POA: Diagnosis not present

## 2018-12-24 NOTE — Progress Notes (Signed)
Daily Session Note  Patient Details  Name: Ricky Johnston MRN: [id number] Date of Birth: 06/04/1951 Referring Provider:     Pulmonary Rehab from 08/07/2018 in [facility] Cardiac and Pulmonary Rehab  Referring Provider  Suzie Portela MD [[place]]      Encounter Date: 12/24/2018  Check In: Session Check In - 12/24/18 1012      Check-In   Supervising physician immediately available to respond to emergencies  [facility] immediately available ER MD    Physician(s)  Dr. Jodell Cipro and Dr. Quentin Cornwall    Location  [facility]-Cardiac & Pulmonary Rehab    Staff Present  Earlean Shawl, BS, ACSM CEP, Exercise Physiologist;Joseph [facility], [place], ACSM CEP, Exercise Physiologist    Medication changes reported      No    Fall or balance concerns reported     No    Tobacco Cessation  No Change    Warm-up and Cool-down  Performed as group-led instruction    Resistance Training Performed  Yes    VAD Patient?  No    PAD/SET Patient?  No      Pain Assessment   Currently in Pain?  No/denies    Multiple Pain Sites  No          Social History   Tobacco Use  Smoking Status Former Smoker  . Packs/day: 1.00  . Years: 33.00  . Pack years: 33.00  . Types: Cigarettes  . Last attempt to quit: 04/06/2000  . Years since quitting: 18.7  Smokeless Tobacco Never Used  Tobacco Comment   use to chew cigars    Goals Met:  Proper associated with RPD/PD & O2 Sat Independence with exercise equipment Exercise tolerated well No report of cardiac concerns or symptoms Strength training completed today  Goals Unmet:  Not Applicable  Comments: Pt able to follow exercise prescription today without complaint.  Will continue to monitor for progression.    Dr. Emily Filbert is Medical Director for [place] and [facility].

## 2018-12-26 DIAGNOSIS — J449 Chronic obstructive pulmonary disease, unspecified: Secondary | ICD-10-CM

## 2018-12-26 NOTE — Progress Notes (Signed)
Daily Session Note  Patient Details  Name: Vaun Hyndman MRN: [id number] Date of Birth: [birth year] Referring Provider:     Pulmonary Rehab from 08/07/2018 in [facility] Cardiac and Pulmonary Rehab  Referring Provider  Suzie Portela MD [[facility]]      Encounter Date: 12/26/2018  Check In: Session Check In - 12/26/18 1022      Check-In   Supervising physician immediately available to respond to emergencies  [facility] immediately available ER MD    Physician(s)  Jimmye Norman and [facility]    Location  [facility]-Cardiac & Pulmonary Rehab    Staff Present  Alberteen Sam, MA, RCEP, CCRP, Exercise Physiologist;Joseph Foy [place], [place], ACSM CEP, Exercise Physiologist    Medication changes reported      No    Fall or balance concerns reported     No    Warm-up and Cool-down  Performed as group-led instruction    Resistance Training Performed  Yes    VAD Patient?  No    PAD/SET Patient?  No      Pain Assessment   Currently in Pain?  No/denies    Multiple Pain Sites  No          Social History   Tobacco Use  Smoking Status Former Smoker  . Packs/day: 1.00  . Years: 33.00  . Pack years: 33.00  . Types: Cigarettes  . Last attempt to quit: 04/06/2000  . Years since quitting: 18.7  Smokeless Tobacco Never Used  Tobacco Comment   use to chew cigars    Goals Met:  Independence with exercise equipment Exercise tolerated well No report of cardiac concerns or symptoms Strength training completed today  Goals Unmet:  Not Applicable  Comments: Pt able to follow exercise prescription today without complaint.  Will continue to monitor for progression.    Dr. Emily Filbert is Medical Director for [place] and [facility].

## 2018-12-28 DIAGNOSIS — J449 Chronic obstructive pulmonary disease, unspecified: Secondary | ICD-10-CM | POA: Diagnosis not present

## 2018-12-28 NOTE — Progress Notes (Signed)
Daily Session Note  Patient Details  Name: Ricky Johnston MRN: [id number] Date of Birth: 08-Jun-1951 Referring Provider:     Pulmonary Rehab from 08/07/2018 in [facility] Cardiac and Pulmonary Rehab  Referring Provider  Suzie Portela MD [[facility]]      Encounter Date: 12/28/2018  Check In:      Social History   Tobacco Use  Smoking Status Former Smoker  . Packs/day: 1.00  . Years: 33.00  . Pack years: 33.00  . Types: Cigarettes  . Last attempt to quit: 04/06/2000  . Years since quitting: 18.7  Smokeless Tobacco Never Used  Tobacco Comment   use to chew cigars    Goals Met:  Proper associated with RPD/PD & O2 Sat Independence with exercise equipment Exercise tolerated well Strength training completed today  Goals Unmet:  Not Applicable  Comments: Pt able to follow exercise prescription today without complaint.  Will continue to monitor for progression.    Dr. Emily Filbert is Medical Director for [place] and [facility].

## 2018-12-31 ENCOUNTER — Encounter: Payer: No Typology Code available for payment source | Attending: *Deleted | Admitting: *Deleted

## 2018-12-31 DIAGNOSIS — Z87891 Personal history of nicotine dependence: Secondary | ICD-10-CM | POA: Diagnosis not present

## 2018-12-31 DIAGNOSIS — Z8551 Personal history of malignant neoplasm of bladder: Secondary | ICD-10-CM | POA: Diagnosis not present

## 2018-12-31 DIAGNOSIS — J8410 Pulmonary fibrosis, unspecified: Secondary | ICD-10-CM | POA: Diagnosis not present

## 2018-12-31 DIAGNOSIS — H9190 Unspecified hearing loss, unspecified ear: Secondary | ICD-10-CM | POA: Diagnosis not present

## 2018-12-31 DIAGNOSIS — Z87442 Personal history of urinary calculi: Secondary | ICD-10-CM | POA: Insufficient documentation

## 2018-12-31 DIAGNOSIS — Z86718 Personal history of other venous thrombosis and embolism: Secondary | ICD-10-CM | POA: Insufficient documentation

## 2018-12-31 DIAGNOSIS — G4730 Sleep apnea, unspecified: Secondary | ICD-10-CM | POA: Diagnosis not present

## 2018-12-31 DIAGNOSIS — E119 Type 2 diabetes mellitus without complications: Secondary | ICD-10-CM | POA: Diagnosis not present

## 2018-12-31 DIAGNOSIS — I10 Essential (primary) hypertension: Secondary | ICD-10-CM | POA: Diagnosis not present

## 2018-12-31 DIAGNOSIS — K219 Gastro-esophageal reflux disease without esophagitis: Secondary | ICD-10-CM | POA: Insufficient documentation

## 2018-12-31 DIAGNOSIS — Z7982 Long term (current) use of aspirin: Secondary | ICD-10-CM | POA: Diagnosis not present

## 2018-12-31 DIAGNOSIS — M1990 Unspecified osteoarthritis, unspecified site: Secondary | ICD-10-CM | POA: Diagnosis not present

## 2018-12-31 DIAGNOSIS — J449 Chronic obstructive pulmonary disease, unspecified: Secondary | ICD-10-CM | POA: Diagnosis not present

## 2018-12-31 DIAGNOSIS — Z7951 Long term (current) use of inhaled steroids: Secondary | ICD-10-CM | POA: Insufficient documentation

## 2018-12-31 DIAGNOSIS — G2581 Restless legs syndrome: Secondary | ICD-10-CM | POA: Diagnosis not present

## 2018-12-31 DIAGNOSIS — E7800 Pure hypercholesterolemia, unspecified: Secondary | ICD-10-CM | POA: Insufficient documentation

## 2018-12-31 DIAGNOSIS — Z794 Long term (current) use of insulin: Secondary | ICD-10-CM | POA: Insufficient documentation

## 2018-12-31 DIAGNOSIS — Z79899 Other long term (current) drug therapy: Secondary | ICD-10-CM | POA: Insufficient documentation

## 2018-12-31 NOTE — Progress Notes (Signed)
Daily Session Note  Patient Details  Name: Ricky Johnston MRN: [id number] Date of Birth: 06-10-1951 Referring Provider:     Pulmonary Rehab from 08/07/2018 in [facility] Cardiac and Pulmonary Rehab  Referring Provider  Suzie Portela MD [[facility]]      Encounter Date: 12/31/2018  Check In: Session Check In - 12/31/18 1011      Check-In   Supervising physician immediately available to respond to emergencies  [facility] immediately available ER MD    Physician(s)  Quentin Cornwall and Joni Fears    Location  [facility]-Cardiac & Pulmonary Rehab    Staff Present  Earlean Shawl, BS, ACSM CEP, Exercise Physiologist;Amanda Oletta Darter, BA, ACSM CEP, Exercise Physiologist;Jessica Luan Pulling, MA, RCEP, CCRP, Exercise Physiologist    Medication changes reported      No    Fall or balance concerns reported     No    Warm-up and Cool-down  Performed as group-led instruction    Resistance Training Performed  Yes    VAD Patient?  No    PAD/SET Patient?  No      Pain Assessment   Currently in Pain?  No/denies    Multiple Pain Sites  No          Social History   Tobacco Use  Smoking Status Former Smoker  . Packs/day: 1.00  . Years: 33.00  . Pack years: 33.00  . Types: Cigarettes  . Last attempt to quit: 04/06/2000  . Years since quitting: 18.7  Smokeless Tobacco Never Used  Tobacco Comment   use to chew cigars    Goals Met:  Proper associated with RPD/PD & O2 Sat Independence with exercise equipment Exercise tolerated well No report of cardiac concerns or symptoms Strength training completed today  Goals Unmet:  Not Applicable  Comments: Pt able to follow exercise prescription today without complaint.  Will continue to monitor for progression.    Dr. Emily Filbert is Medical Director for [place] and [facility].

## 2019-01-02 DIAGNOSIS — J449 Chronic obstructive pulmonary disease, unspecified: Secondary | ICD-10-CM | POA: Diagnosis not present

## 2019-01-02 NOTE — Progress Notes (Signed)
Daily Session Note  Patient Details  Name: Ricky Johnston MRN: [id number] Date of Birth: 1950/12/10 Referring Provider:     Pulmonary Rehab from 08/07/2018 in [facility]  Referring Provider  Suzie Portela MD [[facility]]      Encounter Date: 01/02/2019  Check In: Session Check In - 01/02/19 1050      Check-In   Supervising physician immediately available to respond to emergencies  [facility] immediately available ER MD    Physician(s)  Jimmye Norman and Alfred Levins    Location  [facility]-Cardiac & Pulmonary Rehab    Staff Present  Alberteen Sam, MA, RCEP, CCRP, Exercise Physiologist;Joseph Foy [place], [place], ACSM CEP, Exercise Physiologist    Medication changes reported      No    Fall or balance concerns reported     No    Warm-up and Cool-down  Performed as group-led instruction    Resistance Training Performed  Yes    VAD Patient?  No    PAD/SET Patient?  No      Pain Assessment   Currently in Pain?  No/denies    Multiple Pain Sites  No          Social History   Tobacco Use  Smoking Status Former Smoker  . Packs/day: 1.00  . Years: 33.00  . Pack years: 33.00  . Types: Cigarettes  . Last attempt to quit: 04/06/2000  . Years since quitting: 18.7  Smokeless Tobacco Never Used  Tobacco Comment   use to chew cigars    Goals Met:  Independence with exercise equipment Exercise tolerated well No report of cardiac concerns or symptoms Strength training completed today  Goals Unmet:  Not Applicable  Comments: Pt able to follow exercise prescription today without complaint.  Will continue to monitor for progression.    Dr. Emily Filbert is Medical Director for [place] and [facility].

## 2019-01-07 ENCOUNTER — Encounter: Payer: No Typology Code available for payment source | Admitting: *Deleted

## 2019-01-07 VITALS — Ht 67.6 in | Wt 272.0 lb

## 2019-01-07 DIAGNOSIS — J449 Chronic obstructive pulmonary disease, unspecified: Secondary | ICD-10-CM | POA: Diagnosis not present

## 2019-01-07 NOTE — Progress Notes (Signed)
Daily Session Note  Patient Details  Name: Ricky Johnston MRN: [id number] Date of Birth: [birth year] Referring Provider:     Pulmonary Rehab from 08/07/2018 in [facility] Card[facility] Pul[facility]  Referring Provider  Suzie Portela MD [[facility]]      Encounter Date: 01/07/2019  Check In: Session Check In - 01/07/19 1009      Check-In   Supervising physician immediately available to respond to emergencies  [facility] immediately available ER MD    Physician(s)  Dr. Corky Downs and Dr. Clearnce Hasten     Location  [facility]-Cardiac & Pulmonary Rehab    Staff Present  Earlean Shawl, BS, ACSM CEP, Exercise Physiologist;Joseph [facility], [place], ACSM CEP, Exercise Physiologist    Medication changes reported      No    Fall or balance concerns reported     No    Tobacco Cessation  No Change    Warm-up and Cool-down  Performed as group-led instruction    Resistance Training Performed  Yes    VAD Patient?  No    PAD/SET Patient?  No      Pain Assessment   Currently in Pain?  No/denies    Multiple Pain Sites  No          Social History   Tobacco Use  Smoking Status Former Smoker  . Packs/day: 1.00  . Years: 33.00  . Pack years: 33.00  . Types: Cigarettes  . Last attempt to quit: 04/06/2000  . Years since quitting: 18.7  Smokeless Tobacco Never Used  Tobacco Comment   use to chew cigars    Goals Met:  Proper associated with RPD/PD & O2 Sat Independence with exercise equipment Exercise tolerated well Personal goals reviewed No report of cardiac concerns or symptoms Strength training completed today  Goals Unmet:  Not Applicable  Comments: Pt able to follow exercise prescription today without complaint.  Will continue to monitor for progression.  [place] Name 08/07/18 1618 01/07/19 1032       6 Minute Walk   Phase  Initial  Discharge    Distance  555 feet  1050 feet    Distance % Change  -  89 %    Distance Feet Change  -  495 ft    Walk Time  4.9  minutes  6 minutes    # of Rest Breaks  1 1:06  0    MPH  1.29  1.99    METS  1.07  2.78    RPE  15  17    Perceived Dyspnea   4  3    VO2 Peak  3.75  9.72    Symptoms  Yes (comment)  No    Comments  SOB, using rollator for support   used rollator for support    Resting HR  79 bpm  109 bpm    Resting BP  146/64  128/62    Resting Oxygen Saturation   90 %  94 %    Exercise Oxygen Saturation  during 6 min walk  87 %  83 %    Max Ex. HR  124 bpm  150 bpm    Max Ex. BP  156/64  176/68    2 Minute Post BP  150/56  -      Interval HR   1 Minute HR  110  114    2 Minute HR  117  98    3 Minute HR  121  105    4 Minute HR  106  110    5 Minute HR  112  98    6 Minute HR  124  150    2 Minute Post HR  100  116    Interval Heart Rate?  Yes  Yes      Interval Oxygen   Interval Oxygen?  Yes  Yes    Baseline Oxygen Saturation %  90 %  94 %    1 Minute Oxygen Saturation %  89 %  88 %    1 Minute Liters of Oxygen  8 L continuous  4 L    2 Minute Oxygen Saturation %  91 %  87 %    2 Minute Liters of Oxygen  8 L  4 L    3 Minute Oxygen Saturation %  89 % rest break 2:51-4:07  85 %    3 Minute Liters of Oxygen  8 L  4 L    4 Minute Oxygen Saturation %  92 %  84 %    4 Minute Liters of Oxygen  8 L  4 L    5 Minute Oxygen Saturation %  95 %  83 %    5 Minute Liters of Oxygen  8 L  4 L    6 Minute Oxygen Saturation %  89 % 87% immediately post  83 %    6 Minute Liters of Oxygen  8 L  4 L    2 Minute Post Oxygen Saturation %  96 %  92 %    2 Minute Post Liters of Oxygen  8 L  4 L          Dr. Emily Filbert is Medical Director for [place] and [facility].

## 2019-01-07 NOTE — Progress Notes (Signed)
Pulmonary Individual Treatment Plan  Patient Details  Name: Ricky Johnston MRN: [id number] Date of Birth: [birth year] Referring Provider:     Pulmonary Rehab from 08/07/2018 in [facility] Cardiac and Pulmonary Rehab  Referring Provider  Suzie Portela MD [[facility]]      Initial Encounter Date:    Pulmonary Rehab from 08/07/2018 in [facility] Cardiac and Pulmonary Rehab  Date  08/07/18      Visit Diagnosis: Chronic obstructive pulmonary disease, unspecified COPD type ([place])  Patient's Home Medications on Admission:  Current Outpatient Medications:  .  acetaminophen (TYLENOL) 325 MG tablet, Take 3 tablets (975 mg total) by mouth every 8 (eight) hours as needed for mild pain. (Patient taking differently: Take 975 mg by mouth 2 (two) times daily as needed for mild pain. ), Disp: , Rfl:  .  albuterol (PROVENTIL HFA;VENTOLIN HFA) 108 (90 Base) MCG/ACT inhaler, Inhale 2 puffs into the lungs every 6 (six) hours as needed for wheezing or shortness of breath., Disp: , Rfl:  .  ammonium lactate (LAC-HYDRIN) 12 % lotion, Apply 1 application topically daily as needed for dry skin., Disp: , Rfl:  .  aspirin 81 MG chewable tablet, Chew 1 tablet (81 mg total) by mouth 2 (two) times daily after a meal., Disp: 60 tablet, Rfl: 0 .  atorvastatin (LIPITOR) 80 MG tablet, Take 80 mg by mouth at bedtime., Disp: , Rfl:  .  budesonide-formoterol (SYMBICORT) 160-4.5 MCG/ACT inhaler, Inhale 2 puffs into the lungs 2 (two) times daily., Disp: , Rfl:  .  cetirizine (ZYRTEC) 10 MG tablet, Take 10 mg by mouth daily as needed for allergies., Disp: , Rfl:  .  cholecalciferol (VITAMIN D) 1000 UNITS tablet, Take 1,000 Units by mouth daily. , Disp: , Rfl:  .  dextrose (GLUTOSE) 40 % GEL, Take by mouth., Disp: , Rfl:  .  ferrous sulfate 325 (65 FE) MG tablet, Take 325 mg by mouth daily. , Disp: , Rfl:  .  fluticasone (FLONASE) 50 MCG/ACT nasal spray, , Disp: , Rfl:  .  furosemide (LASIX) 20 MG tablet, Take 40 mg by mouth daily. , Disp: , Rfl:   .  gabapentin (NEURONTIN) 300 MG capsule, Take 600 mg by mouth 2 (two) times daily. , Disp: , Rfl:  .  HUMULIN R U-500 KWIKPEN 500 UNIT/ML kwikpen, , Disp: , Rfl:  .  hydrocortisone 2.5 % lotion, Apply topically., Disp: , Rfl:  .  JARDIANCE 10 MG TABS tablet, , Disp: , Rfl:  .  ketoconazole (NIZORAL) 2 % shampoo, Apply topically., Disp: , Rfl:  .  losartan (COZAAR) 25 MG tablet, Take 25 mg by mouth daily., Disp: , Rfl:  .  metFORMIN (GLUCOPHAGE) 1000 MG tablet, Take 1,000 mg by mouth 2 (two) times daily with a meal., Disp: , Rfl:  .  methocarbamol (ROBAXIN) 500 MG tablet, Take 1 tablet (500 mg total) by mouth every 6 (six) hours as needed for muscle spasms., Disp: 60 tablet, Rfl: 0 .  nitrofurantoin, macrocrystal-monohydrate, (MACROBID) 100 MG capsule, , Disp: , Rfl:  .  omeprazole (PRILOSEC) 20 MG capsule, Take 20 mg by mouth 2 (two) times daily before a meal., Disp: , Rfl:  .  OXYGEN, Inhale 1 L into the lungs continuous. At night time may increase to 2 L as needed for shortness of breath, Disp: , Rfl:  .  rOPINIRole (REQUIP) 0.5 MG tablet, Take 0.5 mg by mouth at bedtime., Disp: , Rfl:  .  selenium sulfide (SELSUN) 2.5 % shampoo, Apply topically., Disp: ,  Rfl:  .  senna-docusate (SENOKOT-S) 8.6-50 MG tablet, Take by mouth., Disp: , Rfl:  .  sodium chloride (ALTAMIST SPRAY) 0.65 % nasal spray, Place into the nose., Disp: , Rfl:  .  SUMAtriptan 6 MG/0.5ML SOAJ, Inject into the skin., Disp: , Rfl:  .  SUPER B COMPLEX/C PO, Take 1 tablet by mouth 2 (two) times daily., Disp: , Rfl:  .  tamsulosin (FLOMAX) 0.4 MG CAPS capsule, Take 0.4 mg by mouth daily., Disp: , Rfl:  .  tiotropium (SPIRIVA HANDIHALER) 18 MCG inhalation capsule, Place 18 mcg into inhaler and inhale daily., Disp: , Rfl:   Past Medical History: Past Medical History:  Diagnosis Date  . Arthritis   . Cancer [facility])    bladder cancer currently 2016  . Complication of anesthesia    hard for him to wake up from Anesthesia from left  nephrectomy  . COPD (chronic obstructive pulmonary disease) ([place])   . Diabetes mellitus without complication ([place])   . DVT (deep venous thrombosis) ([place]) 01/08/2014, 2010   upper extremity  . GERD (gastroesophageal reflux disease)   . Hallux limitus 05/18/2015   from notes from [place]   . Headache    migraines  . History of kidney stones   . Hypercholesterolemia   . Hypertension   . Impaired hearing   . Intervertebral disc syndrome   . Kidney stones   . Pulmonary fibrosis ([place])   . Renal calculi 01/08/2014   frrom noted from [facility] .in chart  . Restless legs 01/08/2014  . Sciatic leg pain    paralysis of sciatic nerve  . Sleep apnea    CPAP/BIPAP  . Tinnitus     Tobacco Use: Social History   Tobacco Use  Smoking Status Former Smoker  . Packs/day: 1.00  . Years: 33.00  . Pack years: 33.00  . Types: Cigarettes  . Last attempt to quit: 04/06/2000  . Years since quitting: 18.7  Smokeless Tobacco Never Used  Tobacco Comment   use to chew cigars    Labs: Recent Review Flowsheet Data    Labs for ITP Cardiac and Pulmonary Rehab Latest Ref Rng & Units 01/08/2015 02/10/2017 02/10/2017 02/11/2017   Hemoglobin A1c 4.8 - 5.6 % 8.1(H) - - -   PHART 7.350 - 7.450 - 7.181(LL) 7.217(L) 7.298(L)   PCO2ART 32.0 - 48.0 mmHg - 82.8(HH) 73.1(HH) 62.5(H)   HCO3 20.0 - 28.0 mmol/L - 29.8(H) 28.6(H) 29.4(H)   ACIDBASEDEF 0.0 - 2.0 mmol/L - 0.7 0.8 -   O2SAT % - 98.5 93.5 96.1       Pulmonary Assessment Scores: Pulmonary Assessment Scores    Row Name 08/07/18 1558 12/31/18 1032       ADL UCSD   ADL Phase  Entry  Exit    SOB Score total  75  22    Rest  3  0    Walk  5  0    Stairs  5  0    Bath  4  1    Dress  4  1    Shop  0  1      CAT Score   CAT Score  34  24      mMRC Score   mMRC Score  4  -       Pulmonary Function Assessment: Pulmonary Function Assessment - 08/07/18 1523      Initial Spirometry Results   FVC%  31 %    FEV1%  35 %  FEV1/FVC Ratio   85.68    Comments  good patient effort      Post Bronchodilator Spirometry Results   FVC%  32.24 %    FEV1%  37.8 %    FEV1/FVC Ratio  89.05    Comments  good patient effort       Breath   Bilateral Breath Sounds  Clear;Decreased    Shortness of Breath  Limiting activity;Yes;Fear of Shortness of Breath       Exercise Target Goals: Exercise Program Goal: Individual exercise prescription set using results from initial 6 min walk test and THRR while considering  patient's activity barriers and safety.   Exercise Prescription Goal: Initial exercise prescription builds to 30-45 minutes a day of aerobic activity, 2-3 days per week.  Home exercise guidelines will be given to patient during program as part of exercise prescription that the participant will acknowledge.  Activity Barriers & Risk Stratification: Activity Barriers & Cardiac Risk Stratification - 08/07/18 1621      Activity Barriers & Cardiac Risk Stratification   Activity Barriers  Shortness of Breath;Balance Concerns;History of Falls;Left Hip Replacement;Assistive Device;Muscular Weakness;Deconditioning;Back Problems;Joint Problems   DJD, chronic knee pain      6 Minute Walk: 6 Minute Walk    Row Name 08/07/18 1618         6 Minute Walk   Phase  Initial     Distance  555 feet     Walk Time  4.9 minutes     # of Rest Breaks  1 1:06     MPH  1.29     METS  1.07     RPE  15     Perceived Dyspnea   4     VO2 Peak  3.75     Symptoms  Yes (comment)     Comments  SOB, using rollator for support      Resting HR  79 bpm     Resting BP  146/64     Resting Oxygen Saturation   90 %     Exercise Oxygen Saturation  during 6 min walk  87 %     Max Ex. HR  124 bpm     Max Ex. BP  156/64     2 Minute Post BP  150/56       Interval HR   1 Minute HR  110     2 Minute HR  117     3 Minute HR  121     4 Minute HR  106     5 Minute HR  112     6 Minute HR  124     2 Minute Post HR  100     Interval Heart Rate?  Yes        Interval Oxygen   Interval Oxygen?  Yes     Baseline Oxygen Saturation %  90 %     1 Minute Oxygen Saturation %  89 %     1 Minute Liters of Oxygen  8 L continuous     2 Minute Oxygen Saturation %  91 %     2 Minute Liters of Oxygen  8 L     3 Minute Oxygen Saturation %  89 % rest break 2:51-4:07     3 Minute Liters of Oxygen  8 L     4 Minute Oxygen Saturation %  92 %     4 Minute Liters of Oxygen  8 L  5 Minute Oxygen Saturation %  95 %     5 Minute Liters of Oxygen  8 L     6 Minute Oxygen Saturation %  89 % 87% immediately post     6 Minute Liters of Oxygen  8 L     2 Minute Post Oxygen Saturation %  96 %     2 Minute Post Liters of Oxygen  8 L       Oxygen Initial Assessment: Oxygen Initial Assessment - 08/07/18 1520      Home Oxygen   Home Oxygen Device  Home Concentrator;E-Tanks    Sleep Oxygen Prescription  BiPAP;Continuous    Liters per minute  3    Home Exercise Oxygen Prescription  Continuous    Liters per minute  3    Home at Rest Exercise Oxygen Prescription  Continuous    Liters per minute  3    Compliance with Home Oxygen Use  Yes      Initial 6 min Walk   Oxygen Used  Continuous;E-Tanks    Liters per minute  3      Program Oxygen Prescription   Program Oxygen Prescription  E-Tanks;Continuous    Liters per minute  8      Intervention   Short Term Goals  To learn and exhibit compliance with exercise, home and travel O2 prescription;To learn and understand importance of monitoring SPO2 with pulse oximeter and demonstrate accurate use of the pulse oximeter.;To learn and understand importance of maintaining oxygen saturations>88%;To learn and demonstrate proper pursed lip breathing techniques or other breathing techniques.;To learn and demonstrate proper use of respiratory medications    Long  Term Goals  Exhibits compliance with exercise, home and travel O2 prescription;Verbalizes importance of monitoring SPO2 with pulse oximeter and return  demonstration;Maintenance of O2 saturations>88%;Exhibits proper breathing techniques, such as pursed lip breathing or other method taught during program session;Compliance with respiratory medication;Demonstrates proper use of MDI's       Oxygen Re-Evaluation: Oxygen Re-Evaluation    Row Name 08/17/18 1023 09/10/18 1023 09/24/18 1025 11/07/18 1417 11/19/18 1320     Program Oxygen Prescription   Program Oxygen Prescription  -  E-Tanks;Continuous  E-Tanks;Continuous  E-Tanks;Continuous  Continuous;E-Tanks   Liters per minute  -  _0 Comments  -  patient has been doing very well on 4 liters of oxygen while exercising.  -  -  -     Home Oxygen   Home Oxygen Device  -  Home Concentrator;E-Tanks  Home Concentrator;E-Tanks  Home Concentrator;E-Tanks  Portable Concentrator   Sleep Oxygen Prescription  -  BiPAP;Continuous  BiPAP;Continuous  BiPAP;Continuous  BiPAP   Liters per minute  -  _1 Home Exercise Oxygen Prescription  -  Continuous  Continuous  Continuous  Continuous   Liters per minute  -  _2 Home at Rest Exercise Oxygen Prescription  -  Continuous  Continuous  Continuous  Continuous   Liters per minute  -  _3 Compliance with Home Oxygen Use  -  Yes  Yes  Yes  Yes     Goals/Expected Outcomes   Short Term Goals  To learn and understand importance of monitoring SPO2 with pulse oximeter and demonstrate accurate use of the pulse oximeter.;To learn and understand importance of maintaining oxygen saturations>88%;To learn and demonstrate proper  pursed lip breathing techniques or other breathing techniques.  To learn and exhibit compliance with exercise, home and travel O2 prescription;To learn and understand importance of monitoring SPO2 with pulse oximeter and demonstrate accurate use of the pulse oximeter.;To learn and understand importance of maintaining oxygen saturations>88%;To learn and demonstrate proper pursed lip breathing techniques or other breathing  techniques.;To learn and demonstrate proper use of respiratory medications  To learn and exhibit compliance with exercise, home and travel O2 prescription;To learn and understand importance of monitoring SPO2 with pulse oximeter and demonstrate accurate use of the pulse oximeter.;To learn and understand importance of maintaining oxygen saturations>88%;To learn and demonstrate proper pursed lip breathing techniques or other breathing techniques.;To learn and demonstrate proper use of respiratory medications  To learn and exhibit compliance with exercise, home and travel O2 prescription;To learn and understand importance of monitoring SPO2 with pulse oximeter and demonstrate accurate use of the pulse oximeter.;To learn and understand importance of maintaining oxygen saturations>88%;To learn and demonstrate proper pursed lip breathing techniques or other breathing techniques.;To learn and demonstrate proper use of respiratory medications  -   Long  Term Goals  Verbalizes importance of monitoring SPO2 with pulse oximeter and return demonstration;Maintenance of O2 saturations>88%;Exhibits proper breathing techniques, such as pursed lip breathing or other method taught during program session  Exhibits compliance with exercise, home and travel O2 prescription;Verbalizes importance of monitoring SPO2 with pulse oximeter and return demonstration;Maintenance of O2 saturations>88%;Exhibits proper breathing techniques, such as pursed lip breathing or other method taught during program session;Compliance with respiratory medication;Demonstrates proper use of MDI's  Exhibits compliance with exercise, home and travel O2 prescription;Verbalizes importance of monitoring SPO2 with pulse oximeter and return demonstration;Maintenance of O2 saturations>88%;Exhibits proper breathing techniques, such as pursed lip breathing or other method taught during program session;Compliance with respiratory medication;Demonstrates proper use  of MDI's  Exhibits compliance with exercise, home and travel O2 prescription;Verbalizes importance of monitoring SPO2 with pulse oximeter and return demonstration;Maintenance of O2 saturations>88%;Exhibits proper breathing techniques, such as pursed lip breathing or other method taught during program session;Compliance with respiratory medication;Demonstrates proper use of MDI's  -   Comments  Reviewed PLB technique with pt.  Talked about how it work and it's important to maintaining his exercise saturations.    Patient is doing well with PLB on exertion. Algie is able to walk further distances now since the start of the program. He is doing well on 4 liters of oxygen instead of 8 liters for exercise. His oxygen is is above 88 percent when exercising.  Patient is checking his oxygen at home routinely. He knows he needs to be 88 percent and above. He is taking his breathing medications properly. He wants to try to get off oxygen completly.  Patient states that he is doing well with his oxygen at home. He has liquid oxygen now at home and the company and his doctor have told him he is supposed to use more. He has had to use 6 liters of oxygen when on the treadmill but is able to use 4 liters when he is on sit down equipment with no desaturation issues. Patient states that he is supposed to use 12 liters of oxygen when he is at home. Patients oxygen does very well when he is on 4 to 6 liters when exercising.  -   Goals/Expected Outcomes  Short: Become more profiecient at using PLB.   Long: Become independent at using PLB.  Short: check oxygen at home more on exertion. Long: reduce  oxygen below 4 liters for exercise.  Short: move down to 3 liters for sit down equipment. Long: get off oxygen for exercise.  Short: ask his doctor why he should be on 12 liters. Long: keep oxygen saturations above 88 percent with less oxygen.  -   [place] Name 12/19/18 1418             Program Oxygen Prescription   Program Oxygen  Prescription  Continuous;E-Tanks       Liters per minute  4         Home Oxygen   Home Oxygen Device  Portable Concentrator       Sleep Oxygen Prescription  BiPAP       Liters per minute  3       Home Exercise Oxygen Prescription  Continuous       Liters per minute  3       Home at Rest Exercise Oxygen Prescription  Continuous       Liters per minute  3       Compliance with Home Oxygen Use  Yes         Goals/Expected Outcomes   Short Term Goals  To learn and exhibit compliance with exercise, home and travel O2 prescription;To learn and understand importance of monitoring SPO2 with pulse oximeter and demonstrate accurate use of the pulse oximeter.;To learn and understand importance of maintaining oxygen saturations>88%;To learn and demonstrate proper pursed lip breathing techniques or other breathing techniques.;To learn and demonstrate proper use of respiratory medications       Long  Term Goals  Exhibits compliance with exercise, home and travel O2 prescription;Verbalizes importance of monitoring SPO2 with pulse oximeter and return demonstration;Maintenance of O2 saturations>88%;Exhibits proper breathing techniques, such as pursed lip breathing or other method taught during program session;Compliance with respiratory medication;Demonstrates proper use of MDI's       Comments  Patient has been using his PLB and is utilizing it outside of class. He is going to his pulmonologist this week for a follow up. He is going to try to walk down to class instead of using his scooter. Informed patient that he can rest at benches and catch his breath if he needs to. He was worried about his balance but says that he can use a walker.       Goals/Expected Outcomes  Short: use walker to get get to class. Long: walk with walker independently to get around.          Oxygen Discharge (Final Oxygen Re-Evaluation): Oxygen Re-Evaluation - 12/19/18 1418      Program Oxygen Prescription   Program Oxygen  Prescription  Continuous;E-Tanks    Liters per minute  4      Home Oxygen   Home Oxygen Device  Portable Concentrator    Sleep Oxygen Prescription  BiPAP    Liters per minute  3    Home Exercise Oxygen Prescription  Continuous    Liters per minute  3    Home at Rest Exercise Oxygen Prescription  Continuous    Liters per minute  3    Compliance with Home Oxygen Use  Yes      Goals/Expected Outcomes   Short Term Goals  To learn and exhibit compliance with exercise, home and travel O2 prescription;To learn and understand importance of monitoring SPO2 with pulse oximeter and demonstrate accurate use of the pulse oximeter.;To learn and understand importance of maintaining oxygen saturations>88%;To learn and demonstrate proper pursed lip breathing techniques or  other breathing techniques.;To learn and demonstrate proper use of respiratory medications    Long  Term Goals  Exhibits compliance with exercise, home and travel O2 prescription;Verbalizes importance of monitoring SPO2 with pulse oximeter and return demonstration;Maintenance of O2 saturations>88%;Exhibits proper breathing techniques, such as pursed lip breathing or other method taught during program session;Compliance with respiratory medication;Demonstrates proper use of MDI's    Comments  Patient has been using his PLB and is utilizing it outside of class. He is going to his pulmonologist this week for a follow up. He is going to try to walk down to class instead of using his scooter. Informed patient that he can rest at benches and catch his breath if he needs to. He was worried about his balance but says that he can use a walker.    Goals/Expected Outcomes  Short: use walker to get get to class. Long: walk with walker independently to get around.       Initial Exercise Prescription: Initial Exercise Prescription - 08/07/18 1600      Date of Initial Exercise RX and Referring Provider   Date  08/07/18    Referring Provider   Suzie Portela MD   [facility]     Oxygen   Oxygen  Continuous    Liters  8-10      Treadmill   MPH  1    Grade  0    Minutes  15    METs  1.77      NuStep   Level  1    SPM  80    Minutes  15    METs  1.7      Biostep-RELP   Level  1    SPM  60    Minutes  15    METs  2      Prescription Details   Frequency (times per week)  3    Duration  Progress to 45 minutes of aerobic exercise without signs/symptoms of physical distress      Intensity   THRR 40-80% of Max Heartrate  109-138    Ratings of Perceived Exertion  11-13    Perceived Dyspnea  0-4      Progression   Progression  Continue to progress workloads to maintain intensity without signs/symptoms of physical distress.      Resistance Training   Training Prescription  Yes    Weight  4 lbs    Reps  10-15       Perform Capillary Blood Glucose checks as needed.  Exercise Prescription Changes: Exercise Prescription Changes    Row Name 08/07/18 1600 08/22/18 1200 09/05/18 1100 09/19/18 1200 09/24/18 1000     Response to Exercise   Blood Pressure (Admit)  146/64  124/64  134/68  142/68  -   Blood Pressure (Exercise)  156/64  142/64  -  -  -   Blood Pressure (Exit)  150/56  126/68  128/60  130/82  -   Heart Rate (Admit)  79 bpm  104 bpm  110 bpm  94 bpm  -   Heart Rate (Exercise)  124 bpm  115 bpm  114 bpm  128 bpm  -   Heart Rate (Exit)  94 bpm  101 bpm  110 bpm  116 bpm  -   Oxygen Saturation (Admit)  90 %  94 %  89 %  94 %  -   Oxygen Saturation (Exercise)  87 %  95 %  88 %  86 %  -  Oxygen Saturation (Exit)  97 %  92 %  88 %  94 %  -   Rating of Perceived Exertion (Exercise)  _0 -   Perceived Dyspnea (Exercise)  4  0  1  1  -   Symptoms  SOB  -  -  -  -   Comments  walk test results  -  -  -  -   Duration  -  Progress to 45 minutes of aerobic exercise without signs/symptoms of physical distress  Progress to 45 minutes of aerobic exercise without signs/symptoms of physical distress  Progress to  45 minutes of aerobic exercise without signs/symptoms of physical distress  Progress to 45 minutes of aerobic exercise without signs/symptoms of physical distress   Intensity  -  THRR unchanged  THRR unchanged  THRR unchanged  THRR unchanged     Progression   Progression  -  Continue to progress workloads to maintain intensity without signs/symptoms of physical distress.  Continue to progress workloads to maintain intensity without signs/symptoms of physical distress.  Continue to progress workloads to maintain intensity without signs/symptoms of physical distress.  Continue to progress workloads to maintain intensity without signs/symptoms of physical distress.   Average METs  -  1.76  1.8  1.9  1.9     Resistance Training   Training Prescription  -  Yes  Yes  Yes  Yes   Weight  -  4 lb  4 lb  4 lb  4 lb   Reps  -  10-15  10-15  10-15  10-15     Interval Training   Interval Training  -  -  No  -  -     Treadmill   MPH  -  -  1  -  -   Grade  -  -  0  -  -   Minutes  -  -  15  -  -   METs  -  -  1.77  -  -     NuStep   Level  -  -  _1 SPM  -  -  80  80  80   Minutes  -  -  _2 METs  -  -  1._3 Biostep-RELP   Level  -  -  -  3  3   SPM  -  -  -  50  50   Minutes  -  -  -  15  15   METs  -  -  -  2  2     Home Exercise Plan   Plans to continue exercise at  -  -  -  -  Home (comment) walking at home   Frequency  -  -  -  -  Add 2 additional days to program exercise sessions.   Initial Home Exercises Provided  -  -  -  -  09/24/18   Row Name 10/03/18 1200 10/17/18 1200 10/31/18 1200 11/13/18 1100 11/27/18 1200     Response to Exercise   Blood Pressure (Admit)  130/64  128/70  130/62  144/56  142/82   Blood Pressure (Exit)  132/68  126/64  122/70  138/60  122/70   Heart Rate (Admit)  114 bpm  110 bpm  104 bpm  104  bpm  94 bpm   Heart Rate (Exercise)  113 bpm  137 bpm  125 bpm  107 bpm  116 bpm   Heart Rate (Exit)  64 bpm  111 bpm  93 bpm  97 bpm  87 bpm    Oxygen Saturation (Admit)  92 %  89 %  89 %  91 %  93 %   Oxygen Saturation (Exercise)  92 %  86 % inc to 6L - rested til above 90  88 %  85 %  88 %   Oxygen Saturation (Exit)  96 %  94 %  94 %  91 %  93 %   Rating of Perceived Exertion (Exercise)  _0 Perceived Dyspnea (Exercise)  _1 Duration  Continue with 45 min of aerobic exercise without signs/symptoms of physical distress.  Continue with 45 min of aerobic exercise without signs/symptoms of physical distress.  Continue with 45 min of aerobic exercise without signs/symptoms of physical distress.  Continue with 45 min of aerobic exercise without signs/symptoms of physical distress.  Continue with 45 min of aerobic exercise without signs/symptoms of physical distress.   Intensity  THRR unchanged  THRR unchanged  THRR unchanged  THRR unchanged  THRR unchanged     Progression   Progression  Continue to progress workloads to maintain intensity without signs/symptoms of physical distress.  Continue to progress workloads to maintain intensity without signs/symptoms of physical distress.  Continue to progress workloads to maintain intensity without signs/symptoms of physical distress.  Continue to progress workloads to maintain intensity without signs/symptoms of physical distress.  Continue to progress workloads to maintain intensity without signs/symptoms of physical distress.   Average METs  1.96  2._2 Resistance Training   Training Prescription  Yes  Yes  Yes  Yes  Yes   Weight  4 lb  4 lb  4 lb  4 lb  4 lb   Reps  10-15  10-15  10-15  10-15  10-15     Interval Training   Interval Training  No  No  No  No  No     Treadmill   MPH  -  _3 Grade  -  0  0  0  0   Minutes  -  _4 METs  -  1.77  1.77  1.77  1.77     NuStep   Level  4  -  _5 SPM  80  -  80  80  80   Minutes  15  -  _6 METs  2.3  -  2  2  2.5     Biostep-RELP   Level  _7 SPM  50  50  50  50  50   Minutes  _8 METs  _9 Home Exercise Plan   Plans to continue exercise at  Home (comment) walking at home  Home (comment) walking at home  Home (comment) walking at home  Home (comment) walking at home  Home (comment) walking at home   Frequency  Add 2 additional days to program exercise sessions.  Add 2 additional days to program exercise sessions.  Add 2 additional days to program exercise sessions.  Add 2 additional days to program exercise sessions.  Add 2 additional days to program exercise sessions.   Initial Home Exercises Provided  09/24/18  09/24/18  09/24/18  09/24/18  -   [place] Name 12/12/18 1100 12/26/18 1500           Response to Exercise   Blood Pressure (Admit)  112/52  132/50      Blood Pressure (Exit)  124/70  108/62      Heart Rate (Admit)  63 bpm  80 bpm      Heart Rate (Exercise)  115 bpm  128 bpm      Heart Rate (Exit)  84 bpm  87 bpm      Oxygen Saturation (Admit)  94 %  95 %      Oxygen Saturation (Exercise)  88 %  89 %      Oxygen Saturation (Exit)  93 %  95 %      Rating of Perceived Exertion (Exercise)  13  13      Perceived Dyspnea (Exercise)  3  3      Duration  Continue with 45 min of aerobic exercise without signs/symptoms of physical distress.  Continue with 45 min of aerobic exercise without signs/symptoms of physical distress.      Intensity  THRR unchanged  THRR unchanged        Progression   Progression  Continue to progress workloads to maintain intensity without signs/symptoms of physical distress.  Continue to progress workloads to maintain intensity without signs/symptoms of physical distress.      Average METs  2.8  2.4 due to surgery         Resistance Training   Training Prescription  Yes  Yes      Weight  4 lb  4 lb      Reps  10-15  10-15        Interval Training   Interval Training  No  -        NuStep   Level  6  -      SPM  80  -      Minutes  15  -      METs  2.6  -         Biostep-RELP   Level  10  -      SPM  50  -      Minutes  15  -      METs  3  -        Home Exercise Plan   Plans to continue exercise at  Home (comment) walking at home  -      Frequency  Add 2 additional days to program exercise sessions.  -         Exercise Comments: Exercise Comments    Row Name 08/17/18 1021           Exercise Comments   First full day of exercise!  Patient was oriented to gym and equipment including functions, settings, policies, and procedures.  Patient's individual exercise prescription and treatment plan were reviewed.  All starting workloads were established based on the results of the 6 minute walk test done at initial orientation visit.  The plan for exercise progression was also introduced and progression  will be customized based on patient's performance and goals          Exercise Goals and Review: Exercise Goals    Row Name 08/07/18 1624             Exercise Goals   Increase Physical Activity  Yes       Intervention  Provide advice, education, support and counseling about physical activity/exercise needs.;Develop an individualized exercise prescription for aerobic and resistive training based on initial evaluation findings, risk stratification, comorbidities and participant's personal goals.       Expected Outcomes  Short Term: Attend rehab on a regular basis to increase amount of physical activity.;Long Term: Add in home exercise to make exercise part of routine and to increase amount of physical activity.;Long Term: Exercising regularly at least 3-5 days a week.       Increase Strength and Stamina  Yes       Intervention  Provide advice, education, support and counseling about physical activity/exercise needs.;Develop an individualized exercise prescription for aerobic and resistive training based on initial evaluation findings, risk stratification, comorbidities and participant's personal goals.       Expected Outcomes  Short Term: Increase  workloads from initial exercise prescription for resistance, speed, and METs.;Short Term: Perform resistance training exercises routinely during rehab and add in resistance training at home;Long Term: Improve cardiorespiratory fitness, muscular endurance and strength as measured by increased METs and functional capacity (6MWT)       Able to understand and use rate of perceived exertion (RPE) scale  Yes       Intervention  Provide education and explanation on how to use RPE scale       Expected Outcomes  Short Term: Able to use RPE daily in rehab to express subjective intensity level;Long Term:  Able to use RPE to guide intensity level when exercising independently       Able to understand and use Dyspnea scale  Yes       Intervention  Provide education and explanation on how to use Dyspnea scale       Expected Outcomes  Short Term: Able to use Dyspnea scale daily in rehab to express subjective sense of shortness of breath during exertion;Long Term: Able to use Dyspnea scale to guide intensity level when exercising independently       Knowledge and understanding of Target Heart Rate Range (THRR)  Yes       Intervention  Provide education and explanation of THRR including how the numbers were predicted and where they are located for reference       Expected Outcomes  Short Term: Able to state/look up THRR;Short Term: Able to use daily as guideline for intensity in rehab;Long Term: Able to use THRR to govern intensity when exercising independently       Able to check pulse independently  Yes       Intervention  Provide education and demonstration on how to check pulse in carotid and radial arteries.;Review the importance of being able to check your own pulse for safety during independent exercise       Expected Outcomes  Short Term: Able to explain why pulse checking is important during independent exercise;Long Term: Able to check pulse independently and accurately       Understanding of Exercise  Prescription  Yes       Intervention  Provide education, explanation, and written materials on patient's individual exercise prescription       Expected Outcomes  Short Term:  Able to explain program exercise prescription;Long Term: Able to explain home exercise prescription to exercise independently          Exercise Goals Re-Evaluation : Exercise Goals Re-Evaluation    Row Name 08/17/18 1022 09/05/18 1150 09/19/18 1258 09/24/18 1057 10/03/18 1253     Exercise Goal Re-Evaluation   Exercise Goals Review  Increase Physical Activity;Increase Strength and Stamina;Able to understand and use rate of perceived exertion (RPE) scale;Able to understand and use Dyspnea scale;Able to check pulse independently;Understanding of Exercise Prescription  Increase Physical Activity;Increase Strength and Stamina;Able to understand and use rate of perceived exertion (RPE) scale;Able to understand and use Dyspnea scale  Increase Physical Activity;Increase Strength and Stamina;Able to understand and use rate of perceived exertion (RPE) scale;Able to understand and use Dyspnea scale  Increase Physical Activity;Increase Strength and Stamina;Able to understand and use rate of perceived exertion (RPE) scale;Able to understand and use Dyspnea scale;Knowledge and understanding of Target Heart Rate Range (THRR);Able to check pulse independently;Understanding of Exercise Prescription  Increase Physical Activity;Increase Strength and Stamina;Able to understand and use rate of perceived exertion (RPE) scale;Able to understand and use Dyspnea scale;Understanding of Exercise Prescription   Comments  Reviewed RPE scale, THR and program prescription with pt today.  Pt voiced understanding and was given a copy of goals to take home.   Jamesyn has just started LW.  He needs to attend consistently to see progress.  He tolerates exercise well when he attends.  Constance last session was 10/14.  Regular attendnace is needed for progression  Reviewed  home exercise with pt today.  Pt plans to walk at home for exercise.  Reviewed THR, pulse, RPE, sign and symptoms, NTG use, and when to call 911 or MD.  Also discussed weather considerations and indoor options.  Pt voiced understanding.  Alijah is up to level 10 on Biostep.  Staff will ask if hed like to change to another machine.  He has been consistent attending the past 2 weeks.   Expected Outcomes  Short: Use RPE daily to regulate intensity. Long: Follow program prescription in THR.  Short - attend consistently Long - increase MET level  Short - attend consistently Long - increase MET level  Short: Add 1-2 days of walking at home on days Eann does not come to rehab. Long: Become independent with exercise program.   Short - change Biostep to more challenging piece Long - increase MET level   Row Name 10/17/18 1225 10/31/18 1235 11/27/18 1247 12/12/18 1131 12/26/18 1513     Exercise Goal Re-Evaluation   Exercise Goals Review  Increase Physical Activity;Increase Strength and Stamina;Able to understand and use rate of perceived exertion (RPE) scale;Able to understand and use Dyspnea scale;Knowledge and understanding of Target Heart Rate Range (THRR)  Increase Physical Activity;Increase Strength and Stamina;Able to understand and use rate of perceived exertion (RPE) scale;Able to understand and use Dyspnea scale;Knowledge and understanding of Target Heart Rate Range (THRR);Understanding of Exercise Prescription  Increase Physical Activity;Increase Strength and Stamina;Able to understand and use rate of perceived exertion (RPE) scale;Able to understand and use Dyspnea scale;Knowledge and understanding of Target Heart Rate Range (THRR);Understanding of Exercise Prescription  Increase Physical Activity;Increase Strength and Stamina;Able to understand and use rate of perceived exertion (RPE) scale;Knowledge and understanding of Target Heart Rate Range (THRR);Able to check pulse independently;Able to understand and use  Dyspnea scale;Understanding of Exercise Prescription  Increase Physical Activity;Increase Strength and Stamina;Able to understand and use rate of perceived exertion (RPE) scale;Able to understand  and use Dyspnea scale;Knowledge and understanding of Target Heart Rate Range (THRR);Understanding of Exercise Prescription   Comments  Isiaha has shown good progress and has improved MET level.  He has attended consistently this month.  Ansel has increased overall endurance since beginning LW.  He has increased resistance on machines and is able to complete 45 minutes continuously.  Jaeger continues to progress well and is down to 292 lb.   He has a procedure for another medical issue coming up and will keep staff informed of attendance.  Julez is progressing well and has improved MET level.  He has missed some sessions due to other medical issues.  Fahd reduced workloads after a minor surgical procedure.  He will work back up to full speed as he can.   Expected Outcomes  Short - work on endurance on TM Long - increase MET level  Short - attend 3 times per week Long - increase MET level  Short - continue to attend LW  Long - increase overall MET level  Short - continue with current level upon return Long - increase MET level  Short - attend consistently Long - increase MET level      Discharge Exercise Prescription (Final Exercise Prescription Changes): Exercise Prescription Changes - 12/26/18 1500      Response to Exercise   Blood Pressure (Admit)  132/50    Blood Pressure (Exit)  108/62    Heart Rate (Admit)  80 bpm    Heart Rate (Exercise)  128 bpm    Heart Rate (Exit)  87 bpm    Oxygen Saturation (Admit)  95 %    Oxygen Saturation (Exercise)  89 %    Oxygen Saturation (Exit)  95 %    Rating of Perceived Exertion (Exercise)  13    Perceived Dyspnea (Exercise)  3    Duration  Continue with 45 min of aerobic exercise without signs/symptoms of physical distress.    Intensity  THRR unchanged      Progression    Progression  Continue to progress workloads to maintain intensity without signs/symptoms of physical distress.    Average METs  2.4   due to surgery      Resistance Training   Training Prescription  Yes    Weight  4 lb    Reps  10-15       Nutrition:  Target Goals: Understanding of nutrition guidelines, daily intake of sodium <1518m, cholesterol <2055m calories 30% from fat and 7% or less from saturated fats, daily to have 5 or more servings of fruits and vegetables.  Biometrics: Pre Biometrics - 08/07/18 1625      Pre Biometrics   Height  5' 7.6" (1.717 m)    Weight  (!) 306 lb 14.4 oz (139.2 kg)    Waist Circumference  47.5 inches    Hip Circumference  51 inches    Waist to Hip Ratio  0.93 %    BMI (Calculated)  47.22    Single Leg Stand  0 seconds        Nutrition Therapy Plan and Nutrition Goals: Nutrition Therapy & Goals - 08/29/18 1203      Nutrition Therapy   Diet  DM    Drug/Food Interactions  Statins/Certain Fruits    Protein (specify units)  12oz    Fiber  35 grams    Whole Grain Foods  3 servings   chooses whole grain breads   Saturated Fats  15 max. grams    Fruits  and Vegetables  6 servings/day   8 ideal, eats fruits and vegetables daily   Sodium  1500 grams      Personal Nutrition Goals   Nutrition Goal  Practice eating on a consistent schedule each day. For times when you are not hungry for a meal, it may be helpful to at least have a snack with protein + a carb serving to best manage BG control. This is especially important in the evenings    Personal Goal #2  Add a protein and/or fiber source to meals that are carb-heavy such as cereal to prevent hyperglycemia    Personal Goal #3  Eat things like chocolate in moderation and choose sugar-free varieties when possible    Comments  He does not follow a diabetic diet but does try to monitor CHO intake and not eat sweets other than chocolate (sometimes SF). he is lactose intolerant and tries not to add  salt to foods, though does eat fried foods. Reports BG control over the past month to be fair-poor. Eats whole wheat bread, a variety of fruits and vegetables, and nuts. Chooses SF beverages. Reports to skip meals occasionally d/t lack of hunger      Intervention Plan   Intervention  Prescribe, educate and counsel regarding individualized specific dietary modifications aiming towards targeted core components such as weight, hypertension, lipid management, diabetes, heart failure and other comorbidities.    Expected Outcomes  Short Term Goal: Understand basic principles of dietary content, such as calories, fat, sodium, cholesterol and nutrients.;Short Term Goal: A plan has been developed with personal nutrition goals set during dietitian appointment.;Long Term Goal: Adherence to prescribed nutrition plan.       Nutrition Assessments: Nutrition Assessments - 12/31/18 1031      MEDFICTS Scores   Pre Score  46    Post Score  60    Score Difference  14       Nutrition Goals Re-Evaluation: Nutrition Goals Re-Evaluation    Row Name 08/29/18 1234 10/01/18 1135 11/12/18 1038 12/19/18 1423       Goals   Current Weight  -  -  -  290 lb (131.5 kg)    Nutrition Goal  Practice eating on a consistent schedule each day. For times when you are not hungry for a meal, it may be helpful to at least have a snack with protein + a carb serving to best manage BG control. This is especially important in the evenings  Practice eating on a consistent schedule each day; add a protein and/or fiber source at meals; eat things like chocolate in moderation and choose sugar free varieties when possible  Practice eating on a consistent schedule each day; add a protein and/or fiber source; eat things like chocolate in moderation  Antron still wants to lose weight and he knows it will help him get around and breath easier.     Comment  He skips meals occasionally d/t lack of hunger. Reports to have a small snack like 1/2 pack  of PB crackers rather than a meal sometimes but may also eat nothing. Last night he skipped dinner and did not eat again until before class today  Pt's wife has been helpful in facilitating new eating habits/ schedule for pt. He feel that he is now eating on a more consistent schedule each day and is offered at least a protein at meal times. This morning for breakfast he had an egg, sausage and cheese sandwich rather than cereal. His wife has  also stopped buying chocolate and other sweets  With the help of his wife he has been able to eat on a more consistent schedule and is eating more fruit. He is not snacking as much and has been including protein sources at meal times  Ilyaas states that his diet has been good but his wife sometimes hands him food and tells him to eat it. He stated that he had a piece of cheescake even though he does not like cheescake but the cherrys on top are the reason he ate it.. Informed him that sometimes he needs to say no to food that he doesnt want.    Expected Outcome  He will eat a snack that includes a carb source + a protein source in place of meals if not hungry to avoid going long periods of time without eating to improve BG control. He will do his best to eat on a consistent schedule each day  He will continue to eat 3 meals/day on a regular schedule with snacks as needed and include protien/fiber rich options. He will find alternatives for high-sugar snacks such as chocolate, as he does not think he will be able to cut these foods out completely.  He will continue to eat on a regular/ consistent schedule and to include sources of lean protein regularly. He will continue to eat more fruit and to limit non-nutritious snacks. Long term goal: eat more vegetables  Short: watch diet more closely. Long: lose 5lbs before the end of [facility].      Personal Goal #2 Re-Evaluation   Personal Goal #2  Add a protein and/or fiber source to meals that are carb-heavy such as cereal to  prevent hyperglycemia  -  -  -      Personal Goal #3 Re-Evaluation   Personal Goal #3  Eat things like chocolate in moderation and choose sugar free varieties when possible  -  -  -       Nutrition Goals Discharge (Final Nutrition Goals Re-Evaluation): Nutrition Goals Re-Evaluation - 12/19/18 1423      Goals   Current Weight  290 lb (131.5 kg)    Nutrition Goal  Eliu still wants to lose weight and he knows it will help him get around and breath easier.     Comment  Roarke states that his diet has been good but his wife sometimes hands him food and tells him to eat it. He stated that he had a piece of cheescake even though he does not like cheescake but the cherrys on top are the reason he ate it.. Informed him that sometimes he needs to say no to food that he doesnt want.    Expected Outcome  Short: watch diet more closely. Long: lose 5lbs before the end of [facility].       Psychosocial: Target Goals: Acknowledge presence or absence of significant depression and/or stress, maximize coping skills, provide positive support system. Participant is able to verbalize types and ability to use techniques and skills needed for reducing stress and depression.   Initial Review & Psychosocial Screening: Initial Psych Review & Screening - 08/07/18 1523      Initial Review   Current issues with  Current Sleep Concerns;Current Stress Concerns    Source of Stress Concerns  Chronic Illness;Unable to perform yard/household activities    Comments  The patient is not mobile and his mother is 20 and is making life stressful for him.      Family Dynamics  Good Support System?  Yes    Comments  He can look to his wifes family for support.      Barriers   Psychosocial barriers to participate in program  There are no identifiable barriers or psychosocial needs.;The patient should benefit from training in stress management and relaxation.      Screening Interventions   Interventions  Encouraged to  exercise;Program counselor consult;Provide feedback about the scores to participant;To provide support and resources with identified psychosocial needs    Expected Outcomes  Short Term goal: Utilizing psychosocial counselor, staff and physician to assist with identification of specific Stressors or current issues interfering with healing process. Setting desired goal for each stressor or current issue identified.;Long Term Goal: Stressors or current issues are controlled or eliminated.;Short Term goal: Identification and review with participant of any Quality of Life or Depression concerns found by scoring the questionnaire.;Long Term goal: The participant improves quality of Life and PHQ9 Scores as seen by post scores and/or verbalization of changes       Quality of Life Scores:  Scores of 19 and below usually indicate a poorer quality of life in these areas.  A difference of  2-3 points is a clinically meaningful difference.  A difference of 2-3 points in the total score of the Quality of Life Index has been associated with significant improvement in overall quality of life, self-image, physical symptoms, and general health in studies assessing change in quality of life.  PHQ-9: Recent Review Flowsheet Data    Depression screen [facility] 2/9 12/31/2018 09/28/2018 08/07/2018   Decreased Interest 0 0 1   Down, Depressed, Hopeless 0 0 2   PHQ - 2 Score 0 0 3   Altered sleeping 0 0 2   Tired, decreased energy _0 Change in appetite 0 0 0   Feeling bad or failure about yourself  0 0 0   Trouble concentrating 0 0 0   Moving slowly or fidgety/restless 0 0 0   Suicidal thoughts 0 0 0   PHQ-9 Score _1 Difficult doing work/chores Not difficult at all Not difficult at all Very difficult     Interpretation of Total Score  Total Score Depression Severity:  1-4 = Minimal depression, 5-9 = Mild depression, 10-14 = Moderate depression, 15-19 = Moderately severe depression, 20-27 = Severe depression    Psychosocial Evaluation and Intervention: Psychosocial Evaluation - 08/29/18 1131      Psychosocial Evaluation & Interventions   Interventions  Stress management education;Relaxation education;Encouraged to exercise with the program and follow exercise prescription    Comments  Counselor met with Mr. Grater Iaeger) today for initial psychosocial evaluation.  He is a 68 year old who has COPD; as well as diabetes; sleep apnea and a recent cancer survivor.  Bastian has a strong support system with a spouse of 18 years; (2) step children with positive relationships and (2) half sisters.  Chrishaun reports sleeping fairly well in a recliner for approximately 8-9 hours most nights.  He uses a CPAP for this.  He reports his appetite is "too good" and would like to lose some weight.  Markeese denies a history of depression or anxiety or any current symptoms and is typically in a positive mood.  However, Olon reports his 50 year old mother in [place] is dying and this has been stressful and painful for him - impacting his mood and sleep at times.  Counselor processed this with Christiano and provided support -  offering a grief support group if needed.  Keiran has goals to improve his balance and oxygen levels and possibly lose some weight while in this program.     Expected Outcomes  Short:  Corrie will exercise for his health and mental health as a positive coping strategy.  He will meet with the dietician for a healthy nutrition plan.  Kaydyn will inform this counselor if needs further information/support through this time of grief/loss with his mother's illness.  Long:  Alwaleed will develop a pattern of healthy lifestyle choices for his health and mental health.      Continue Psychosocial Services   Follow up required by staff       Psychosocial Re-Evaluation: Psychosocial Re-Evaluation    [place] Name 09/24/18 1103 11/07/18 1422 11/12/18 1130 01/02/19 1357       Psychosocial Re-Evaluation   Current issues with  Current Stress  Concerns;Current Sleep Concerns  Current Stress Concerns;Current Sleep Concerns  Current Stress Concerns  Current Stress Concerns    Comments  Patient is using his BIPAP machine all the time now and states he is sleeping like a rock. His mom is in bad shape and he will be stressed until she passes. His moms health and memory are going down hill. He gets down about his health and sees how his health has declined. He thinks about the past when he was in the [employer] and it makes him aggravated that he cannot do the things he used to. He wants to get back in shape and be healthy again. Courvoisier has a good attitude and is ready to make a change.  Akim states things are going very except for his bladder. He states that his doctor found a red dot on bladder and it could be cancer again, They are going to run a biopsy to see if is is malignant. Cortavious is in high spirits and is willing to do what it takes to get healthier.  Counselor follow up with Eddie Dibbles today reporting he has seen some progress in his balance and core strength since coming into this program and is using his cane less as a result.  Counselor commended him for this progress.  He states his mother who is "dying" is about the same and hanging on - which is helpful for Mahmood to cope with this.  He reports having the flu recently but recovered much better than in the past and was pleasantly surprised by this.   Staff will continue to follow with Eddie Dibbles - he has a positive mood and is a pleasure to work with.   Finneus thats that his mood is good and his bladder cancer is doing better. He is not bleeding from his bladder like he was. He is going to workout at home when th program is done. He has had a good time in the program and has a positive outlook for the future.    Expected Outcomes  Short: attend [facility] regularly. Long: maintain exercise to decrease stress.  Short: Attend [facility] stress management education to decrease stress. Long: Maintain exercise Post [facility]  to keep stress at a minimum  Short:  Chandra will continue to exercise and practice positive self-care for his health and to decrease his stress.  Long:  Roshan will continue to maintain a positive attitude while practicing improved self-care and engaging in a consistent exercise routine.   Short: Attend [facility] stress management education to decrease stress. Long: Maintain exercise Post [facility] to keep stress at a minimum.  Interventions  Encouraged to attend Pulmonary Rehabilitation for the exercise  Encouraged to attend Pulmonary Rehabilitation for the exercise  -  Encouraged to attend Pulmonary Rehabilitation for the exercise    Continue Psychosocial Services   Follow up required by staff  Follow up required by staff  Follow up required by staff  Follow up required by staff      Initial Review   Source of Stress Concerns  Chronic Illness;Unable to perform yard/household activities  -  -  -       Psychosocial Discharge (Final Psychosocial Re-Evaluation): Psychosocial Re-Evaluation - 01/02/19 1357      Psychosocial Re-Evaluation   Current issues with  Current Stress Concerns    Comments  bria portales that his mood is good and his bladder cancer is doing better. He is not bleeding from his bladder like he was. He is going to workout at home when th program is done. He has had a good time in the program and has a positive outlook for the future.    Expected Outcomes  Short: Attend [facility] stress management education to decrease stress. Long: Maintain exercise Post [facility] to keep stress at a minimum.    Interventions  Encouraged to attend Pulmonary Rehabilitation for the exercise    Continue Psychosocial Services   Follow up required by staff       Education: Education Goals: Education classes will be provided on a weekly basis, covering required topics. Participant will state understanding/return demonstration of topics presented.  Learning Barriers/Preferences: Learning  Barriers/Preferences - 08/07/18 1526      Learning Barriers/Preferences   Learning Barriers  Hearing    Learning Preferences  None       Education Topics:  Initial Evaluation Education: - Verbal, written and demonstration of respiratory meds, oximetry and breathing techniques. Instruction on use of nebulizers and MDIs and importance of monitoring MDI activations.   Pulmonary Rehab from 01/02/2019 in [facility] Cardiac and Pulmonary Rehab  Date  08/07/18  Educator  [facility]  Instruction Review Code  1- Verbalizes Understanding      General Nutrition Guidelines/Fats and Fiber: -Group instruction provided by verbal, written material, models and posters to present the general guidelines for heart healthy nutrition. Gives an explanation and review of dietary fats and fiber.   Pulmonary Rehab from 01/02/2019 in [facility] Cardiac and Pulmonary Rehab  Date  10/03/18  Educator  [facility]  Instruction Review Code  1- Verbalizes Understanding      Controlling Sodium/Reading Food Labels: -Group verbal and written material supporting the discussion of sodium use in heart healthy nutrition. Review and explanation with models, verbal and written materials for utilization of the food label.   Pulmonary Rehab from 01/02/2019 in [facility] Cardiac and Pulmonary Rehab  Date  10/17/18  Educator  [facility]  Instruction Review Code  1- Verbalizes Understanding      Exercise Physiology & General Exercise Guidelines: - Group verbal and written instruction with models to review the exercise physiology of the cardiovascular system and associated critical values. Provides general exercise guidelines with specific guidelines to those with heart or lung disease.    Pulmonary Rehab from 01/02/2019 in [facility] Cardiac and Pulmonary Rehab  Date  12/19/18  Educator  [facility]  Instruction Review Code  1- Verbalizes Understanding      Aerobic Exercise & Resistance Training: - Gives group verbal and written instruction on the various components of exercise.  Focuses on aerobic and resistive training programs and the benefits of this training and how  to safely progress through these programs.   Flexibility, Balance, Mind/Body Relaxation: Provides group verbal/written instruction on the benefits of flexibility and balance training, including mind/body exercise modes such as yoga, pilates and tai chi.  Demonstration and skill practice provided.   Pulmonary Rehab from 01/02/2019 in [facility] Cardiac and Pulmonary Rehab  Date  12/26/18  Educator  [facility]  Instruction Review Code  1- Verbalizes Understanding      Stress and Anxiety: - Provides group verbal and written instruction about the health risks of elevated stress and causes of high stress.  Discuss the correlation between heart/lung disease and anxiety and treatment options. Review healthy ways to manage with stress and anxiety.   Pulmonary Rehab from 01/02/2019 in [facility] Cardiac and Pulmonary Rehab  Date  10/10/18  Educator  [facility]  Instruction Review Code  1- Verbalizes Understanding      Depression: - Provides group verbal and written instruction on the correlation between heart/lung disease and depressed mood, treatment options, and the stigmas associated with seeking treatment.   Exercise & Equipment Safety: - Individual verbal instruction and demonstration of equipment use and safety with use of the equipment.   Pulmonary Rehab from 01/02/2019 in [facility] Cardiac and Pulmonary Rehab  Date  08/07/18  Educator  [facility]  Instruction Review Code  1- Verbalizes Understanding      Infection Prevention: - Provides verbal and written material to individual with discussion of infection control including proper hand washing and proper equipment cleaning during exercise session.   Pulmonary Rehab from 01/02/2019 in [facility] Cardiac and Pulmonary Rehab  Date  08/07/18  Educator  [facility]  Instruction Review Code  1- Verbalizes Understanding      Falls Prevention: - Provides verbal and written material to individual with  discussion of falls prevention and safety.   Pulmonary Rehab from 01/02/2019 in [facility] Cardiac and Pulmonary Rehab  Date  08/07/18  Educator  [facility]  Instruction Review Code  1- Verbalizes Understanding      Diabetes: - Individual verbal and written instruction to review signs/symptoms of diabetes, desired ranges of glucose level fasting, after meals and with exercise. Advice that pre and post exercise glucose checks will be done for 3 sessions at entry of program.   Chronic Lung Diseases: - Group verbal and written instruction to review updates, respiratory medications, advancements in procedures and treatments. Discuss use of supplemental oxygen including available portable oxygen systems, continuous and intermittent flow rates, concentrators, personal use and safety guidelines. Review proper use of inhaler and spacers. Provide informative websites for self-education.    Energy Conservation: - Provide group verbal and written instruction for methods to conserve energy, plan and organize activities. Instruct on pacing techniques, use of adaptive equipment and posture/positioning to relieve shortness of breath.   Pulmonary Rehab from 01/02/2019 in [facility] Cardiac and Pulmonary Rehab  Date  09/26/18  Educator  [facility]  Instruction Review Code  1- Verbalizes Understanding      Triggers and Exacerbations: - Group verbal and written instruction to review types of environmental triggers and ways to prevent exacerbations. Discuss weather changes, air quality and the benefits of nasal washing. Review warning signs and symptoms to help prevent infections. Discuss techniques for effective airway clearance, coughing, and vibrations.   Pulmonary Rehab from 01/02/2019 in [facility] Cardiac and Pulmonary Rehab  Date  08/29/18  Educator  [facility]  Instruction Review Code  1- Verbalizes Understanding      AED/CPR: - Group verbal and written instruction with the use of models to  demonstrate the basic use of the AED with the  basic ABC's of resuscitation.   Anatomy and Physiology of the Lungs: - Group verbal and written instruction with the use of models to provide basic lung anatomy and physiology related to function, structure and complications of lung disease.   Anatomy & Physiology of the Heart: - Group verbal and written instruction and models provide basic cardiac anatomy and physiology, with the coronary electrical and arterial systems. Review of Valvular disease and Heart Failure   Pulmonary Rehab from 01/02/2019 in [facility] Cardiac and Pulmonary Rehab  Date  11/30/18  Educator  [facility]  Instruction Review Code  5- Refused Teaching      Cardiac Medications: - Group verbal and written instruction to review commonly prescribed medications for heart disease. Reviews the medication, class of the drug, and side effects.   Know Your Numbers and Risk Factors: -Group verbal and written instruction about important numbers in your health.  Discussion of what are risk factors and how they play a role in the disease process.  Review of Cholesterol, Blood Pressure, Diabetes, and BMI and the role they play in your overall health.   Pulmonary Rehab from 01/02/2019 in [facility] Cardiac and Pulmonary Rehab  Date  11/14/18  Educator  [facility]  Instruction Review Code  1- Verbalizes Understanding      Sleep Hygiene: -Provides group verbal and written instruction about how sleep can affect your health.  Define sleep hygiene, discuss sleep cycles and impact of sleep habits. Review good sleep hygiene tips.    Pulmonary Rehab from 01/02/2019 in [facility] Cardiac and Pulmonary Rehab  Date  11/07/18  Educator  [facility]  Instruction Review Code  1- Verbalizes Understanding      Other: -Provides group and verbal instruction on various topics (see comments)    Knowledge Questionnaire Score: Knowledge Questionnaire Score - 12/31/18 1032      Knowledge Questionnaire Score   Pre Score  16/18    Post Score  16/18   test reviewed with pt today        Core Components/Risk Factors/Patient Goals at Admission: Personal Goals and Risk Factors at Admission - 08/07/18 1526      Core Components/Risk Factors/Patient Goals on Admission    Weight Management  Yes;Weight Loss    Intervention  Weight Management: Develop a combined nutrition and exercise program designed to reach desired caloric intake, while maintaining appropriate intake of nutrient and fiber, sodium and fats, and appropriate energy expenditure required for the weight goal.;Weight Management: Provide education and appropriate resources to help participant work on and attain dietary goals.;Weight Management/Obesity: Establish reasonable short term and long term weight goals.;Obesity: Provide education and appropriate resources to help participant work on and attain dietary goals.    Admit Weight  306 lb 14.4 oz (139.2 kg)    Goal Weight: Short Term  301 lb (136.5 kg)    Goal Weight: Long Term  250 lb (113.4 kg)    Expected Outcomes  Short Term: Continue to assess and modify interventions until short term weight is achieved;Long Term: Adherence to nutrition and physical activity/exercise program aimed toward attainment of established weight goal;Understanding recommendations for meals to include 15-35% energy as protein, 25-35% energy from fat, 35-60% energy from carbohydrates, less than 24m of dietary cholesterol, 20-35 gm of total fiber daily;Understanding of distribution of calorie intake throughout the day with the consumption of 4-5 meals/snacks    Improve shortness of breath with ADL's  Yes    Intervention  Provide  education, individualized exercise plan and daily activity instruction to help decrease symptoms of SOB with activities of daily living.    Expected Outcomes  Long Term: Be able to perform more ADLs without symptoms or delay the onset of symptoms;Short Term: Improve cardiorespiratory fitness to achieve a reduction of symptoms when performing ADLs    Diabetes  Yes     Intervention  Provide education about signs/symptoms and action to take for hypo/hyperglycemia.;Provide education about proper nutrition, including hydration, and aerobic/resistive exercise prescription along with prescribed medications to achieve blood glucose in normal ranges: Fasting glucose 65-99 mg/dL    Expected Outcomes  Short Term: Participant verbalizes understanding of the signs/symptoms and immediate care of hyper/hypoglycemia, proper foot care and importance of medication, aerobic/resistive exercise and nutrition plan for blood glucose control.;Long Term: Attainment of HbA1C < 7%.    Hypertension  Yes    Intervention  Provide education on lifestyle modifcations including regular physical activity/exercise, weight management, moderate sodium restriction and increased consumption of fresh fruit, vegetables, and low fat dairy, alcohol moderation, and smoking cessation.;Monitor prescription use compliance.    Expected Outcomes  Short Term: Continued assessment and intervention until BP is < 140/28m HG in hypertensive participants. < 130/836mHG in hypertensive participants with diabetes, heart failure or chronic kidney disease.;Long Term: Maintenance of blood pressure at goal levels.    Lipids  Yes    Intervention  Provide education and support for participant on nutrition & aerobic/resistive exercise along with prescribed medications to achieve LDL <7092mHDL >81m66m  Expected Outcomes  Short Term: Participant states understanding of desired cholesterol values and is compliant with medications prescribed. Participant is following exercise prescription and nutrition guidelines.;Long Term: Cholesterol controlled with medications as prescribed, with individualized exercise RX and with personalized nutrition plan. Value goals: LDL < 70mg36mL > 40 mg.       Core Components/Risk Factors/Patient Goals Review:  Goals and Risk Factor Review    Row Name 09/10/18 1029 09/28/18 1025 11/07/18 1426  11/19/18 1310 12/24/18 1557     Core Components/Risk Factors/Patient Goals Review   Personal Goals Review  Weight Management/Obesity;Improve shortness of breath with ADL's;Hypertension;Lipids;Diabetes  Weight Management/Obesity;Improve shortness of breath with ADL's;Hypertension;Lipids;Diabetes  Weight Management/Obesity;Improve shortness of breath with ADL's;Hypertension;Lipids;Diabetes  Weight Management/Obesity;Improve shortness of breath with ADL's;Develop more efficient breathing techniques such as purse lipped breathing and diaphragmatic breathing and practicing self-pacing with activity.;Diabetes;Hypertension;Stress  Weight Management/Obesity;Improve shortness of breath with ADL's;Diabetes;Hypertension;Stress   Review  Mitchel Nechemiareached his short term goal of losing 5 pounds. He wants to lose weight and continue to improve his ADL's. He has been checking his sugar at home and has had good reading [place] [facility] [place] and his wife can see a difference in his ability to do things, He has been checking his sugar in the morning which is lower but higher than he wants it to be. His A1C is 7.9. His fasting blood sugar is 192 on average.  Pauls blood pressure has been improving since the start of the program. He is controlling his blood sugar at home and has yet to check his A1c. Joshua Ryett short of breath on the treadmill and is one machine that is most difficult for him. Amahri Taavilost 5-10 pounds since the start of the program and wants to keep going.   Dahl Khaymanrts he is still losing weight - slowly. He is taking meds as directed.  He reports good BP at home and BG  is steady.  He walks at home on days not at [facility].  He does report feeling hungry more but eats small portions  He hasnt been sleeping well - he wakes up and has trouble getting back to sleep.  We discussed using tips from Sleep Hygiene education.  Gaje has been losing weight since the start of the program. He has lost another  three pounds and is trying to walk down to rehab instead of using his scooter. He has been able to do more and seems to have more energy. He is managing his diabetes on his own and has no questions about it.  His blood pressure has also improved  since  the start of the program. Asaiah is has 7 more sessions to complete the program.   Expected Outcomes  Short: continue [facility] to lose more weight. Long: lose another 5 pounds in the next month.  Short: work on lowering fasting blood sugar. Long: have an A1c lower than 7.  Short: Attend [facility] regularly to improve shortness of breath with ADL's. Long: maintain independence with ADL's   Short - continue to exercise and eat heathy diet Long - reach goal weight and maintain exercise on his own  Short: graduate [facility]. Long: maintain exercise post [facility].      Core Components/Risk Factors/Patient Goals at Discharge (Final Review):  Goals and Risk Factor Review - 12/24/18 1557      Core Components/Risk Factors/Patient Goals Review   Personal Goals Review  Weight Management/Obesity;Improve shortness of breath with ADL's;Diabetes;Hypertension;Stress    Review  Henry has been losing weight since the start of the program. He has lost another three pounds and is trying to walk down to rehab instead of using his scooter. He has been able to do more and seems to have more energy. He is managing his diabetes on his own and has no questions about it.  His blood pressure has also improved  since  the start of the program. Chaney is has 7 more sessions to complete the program.    Expected Outcomes  Short: graduate [facility]. Long: maintain exercise post [facility].       ITP Comments: ITP Comments    Row Name 08/07/18 1451 08/20/18 0821 09/17/18 0826 09/17/18 0923 10/15/18 0827   ITP Comments  Medical Evaluation completed. Chart sent for review and changes to Dr. Emily Filbert Director of [place]. Diagnosis can be found in [facility] media patient is a [place] patient  30  day review completed. ITP sent to Dr. Emily Filbert Director of [place]. Continue with ITP unless changes are made by physician  30 day review completed. ITP sent to Dr. Emily Filbert Director of [place]. Continue with ITP unless changes are made by physician.  Romie states that he hurt is back somehow and will try to return on Wednesday.  30 day review completed. ITP sent to Dr. Emily Filbert Director of [place]. Continue with ITP unless changes are made by physician.   [place]ram Name 11/12/18 0821 12/10/18 0825         ITP Comments  30 day review completed. ITP sent to Dr. Emily Filbert Director of [place]. Continue with ITP unless changes are made by physician.  30 day review completed. ITP sent to Dr. Emily Filbert Director of [place]. Continue with ITP unless changes are made by physician.         Comments: 30 day review

## 2019-01-11 ENCOUNTER — Encounter: Payer: No Typology Code available for payment source | Admitting: *Deleted

## 2019-01-11 DIAGNOSIS — J449 Chronic obstructive pulmonary disease, unspecified: Secondary | ICD-10-CM | POA: Diagnosis not present

## 2019-01-11 NOTE — Progress Notes (Signed)
Daily Session Note  Patient Details  Name: Hanson Medeiros MRN: [id number] Date of Birth: 1951/05/24 Referring Provider:     Pulmonary Rehab from 08/07/2018 in [facility] Cardiac and Pulmonary Rehab  Referring Provider  Suzie Portela MD [[facility]]      Encounter Date: 01/11/2019  Check In: Session Check In - 01/11/19 1214      Check-In   Supervising physician immediately available to respond to emergencies  [facility] immediately available ER MD    Physician(s)  Dr. Clearnce Hasten and [facility]    Location  [facility]-Cardiac & Pulmonary Rehab    Staff Present  Renita Papa, RN BSN;Jeanna Durrell BS, Exercise Physiologist;Joseph Tessie Fass RCP,RRT,BSRT    Medication changes reported      No    Fall or balance concerns reported     No    Tobacco Cessation  No Change    Warm-up and Cool-down  Performed as group-led instruction    Resistance Training Performed  Yes    VAD Patient?  No    PAD/SET Patient?  No      Pain Assessment   Currently in Pain?  No/denies          Social History   Tobacco Use  Smoking Status Former Smoker  . Packs/day: 1.00  . Years: 33.00  . Pack years: 33.00  . Types: Cigarettes  . Last attempt to quit: 04/06/2000  . Years since quitting: 18.7  Smokeless Tobacco Never Used  Tobacco Comment   use to chew cigars    Goals Met:  Proper associated with RPD/PD & O2 Sat Independence with exercise equipment Exercise tolerated well No report of cardiac concerns or symptoms Strength training completed today  Goals Unmet:  Not Applicable  Comments: Pt able to follow exercise prescription today without complaint.  Will continue to monitor for progression.    Dr. Emily Filbert is Medical Director for [place] and [facility] Pulmonary Rehabilitation.

## 2019-01-14 ENCOUNTER — Encounter: Payer: No Typology Code available for payment source | Admitting: *Deleted

## 2019-01-14 DIAGNOSIS — J449 Chronic obstructive pulmonary disease, unspecified: Secondary | ICD-10-CM

## 2019-01-14 NOTE — Patient Instructions (Signed)
Discharge Patient Instructions  Patient Details  Name: Ricky Johnston MRN: [id number] Date of Birth: [birth year] Referring Provider:  Jennette Kettle, MD  Number of Visits: 2  Reason for Discharge:  Patient reached a stable level of exercise. Patient independent in their exercise. Patient has met program and personal goals.  Smoking History:  Social History   Tobacco Use  Smoking Status Former Smoker  . Packs/day: 1.00  . Years: 33.00  . Pack years: 33.00  . Types: Cigarettes  . Last attempt to quit: 04/06/2000  . Years since quitting: 18.7  Smokeless Tobacco Never Used  Tobacco Comment   use to chew cigars   Diagnosis:  Chronic obstructive pulmonary disease, unspecified COPD type ([place])  Initial Exercise Prescription: Initial Exercise Prescription - 08/07/18 1600      Date of Initial Exercise RX and Referring Provider   Date  08/07/18    Referring Provider  Suzie Portela MD   [facility]     Oxygen   Oxygen  Continuous    Liters  8-10      Treadmill   MPH  1    Grade  0    Minutes  15    METs  1.77      NuStep   Level  1    SPM  80    Minutes  15    METs  1.7      Biostep-RELP   Level  1    SPM  60    Minutes  15    METs  2      Prescription Details   Frequency (times per week)  3    Duration  Progress to 45 minutes of aerobic exercise without signs/symptoms of physical distress      Intensity   THRR 40-80% of Max Heartrate  109-138    Ratings of Perceived Exertion  11-13    Perceived Dyspnea  0-4      Progression   Progression  Continue to progress workloads to maintain intensity without signs/symptoms of physical distress.      Resistance Training   Training Prescription  Yes    Weight  4 lbs    Reps  10-15      Discharge Exercise Prescription (Final Exercise Prescription Changes): Exercise Prescription Changes - 01/08/19 0800      Response to Exercise   Blood Pressure (Admit)  128/62    Blood Pressure (Exit)  120/60    Heart Rate  (Admit)  71 bpm    Heart Rate (Exercise)  150 bpm    Heart Rate (Exit)  80 bpm    Oxygen Saturation (Admit)  94 %    Oxygen Saturation (Exercise)  83 %    Oxygen Saturation (Exit)  92 %    Rating of Perceived Exertion (Exercise)  17    Perceived Dyspnea (Exercise)  3    Duration  Continue with 45 min of aerobic exercise without signs/symptoms of physical distress.    Intensity  THRR unchanged      Progression   Progression  Continue to progress workloads to maintain intensity without signs/symptoms of physical distress.      Resistance Training   Training Prescription  No    Weight  4 lb    Reps  10-15      Arm Ergometer   Level  2    Minutes  15    METs  1.8      Biostep-RELP   Level  10  SPM  80    Minutes  15    METs  3       Functional Capacity: 6 Minute Walk    Row Name 08/07/18 1618 01/07/19 1032       6 Minute Walk   Phase  Initial  Discharge    Distance  555 feet  1050 feet    Distance % Change  -  89 %    Distance Feet Change  -  495 ft    Walk Time  4.9 minutes  6 minutes    # of Rest Breaks  1 1:06  0    MPH  1.29  1.99    METS  1.07  2.78    RPE  15  17    Perceived Dyspnea   4  3    VO2 Peak  3.75  9.72    Symptoms  Yes (comment)  No    Comments  SOB, using rollator for support   used rollator for support    Resting HR  79 bpm  109 bpm    Resting BP  146/64  128/62    Resting Oxygen Saturation   90 %  94 %    Exercise Oxygen Saturation  during 6 min walk  87 %  83 %    Max Ex. HR  124 bpm  150 bpm    Max Ex. BP  156/64  176/68    2 Minute Post BP  150/56  -      Interval HR   1 Minute HR  110  114    2 Minute HR  117  98    3 Minute HR  121  105    4 Minute HR  106  110    5 Minute HR  112  98    6 Minute HR  124  150    2 Minute Post HR  100  116    Interval Heart Rate?  Yes  Yes      Interval Oxygen   Interval Oxygen?  Yes  Yes    Baseline Oxygen Saturation %  90 %  94 %    1 Minute Oxygen Saturation %  89 %  88 %    1 Minute  Liters of Oxygen  8 L continuous  4 L    2 Minute Oxygen Saturation %  91 %  87 %    2 Minute Liters of Oxygen  8 L  4 L    3 Minute Oxygen Saturation %  89 % rest break 2:51-4:07  85 %    3 Minute Liters of Oxygen  8 L  4 L    4 Minute Oxygen Saturation %  92 %  84 %    4 Minute Liters of Oxygen  8 L  4 L    5 Minute Oxygen Saturation %  95 %  83 %    5 Minute Liters of Oxygen  8 L  4 L    6 Minute Oxygen Saturation %  89 % 87% immediately post  83 %    6 Minute Liters of Oxygen  8 L  4 L    2 Minute Post Oxygen Saturation %  96 %  92 %    2 Minute Post Liters of Oxygen  8 L  4 L      Quality of Life:  Personal Goals: Goals established at orientation with interventions provided to work toward goal.  Personal Goals and Risk Factors at Admission - 08/07/18 1526      Core Components/Risk Factors/Patient Goals on Admission    Weight Management  Yes;Weight Loss    Intervention  Weight Management: Develop a combined nutrition and exercise program designed to reach desired caloric intake, while maintaining appropriate intake of nutrient and fiber, sodium and fats, and appropriate energy expenditure required for the weight goal.;Weight Management: Provide education and appropriate resources to help participant work on and attain dietary goals.;Weight Management/Obesity: Establish reasonable short term and long term weight goals.;Obesity: Provide education and appropriate resources to help participant work on and attain dietary goals.    Admit Weight  306 lb 14.4 oz (139.2 kg)    Goal Weight: Short Term  301 lb (136.5 kg)    Goal Weight: Long Term  250 lb (113.4 kg)    Expected Outcomes  Short Term: Continue to assess and modify interventions until short term weight is achieved;Long Term: Adherence to nutrition and physical activity/exercise program aimed toward attainment of established weight goal;Understanding recommendations for meals to include 15-35% energy as protein, 25-35% energy from fat,  35-60% energy from carbohydrates, less than 271m of dietary cholesterol, 20-35 gm of total fiber daily;Understanding of distribution of calorie intake throughout the day with the consumption of 4-5 meals/snacks    Improve shortness of breath with ADL's  Yes    Intervention  Provide education, individualized exercise plan and daily activity instruction to help decrease symptoms of SOB with activities of daily living.    Expected Outcomes  Long Term: Be able to perform more ADLs without symptoms or delay the onset of symptoms;Short Term: Improve cardiorespiratory fitness to achieve a reduction of symptoms when performing ADLs    Diabetes  Yes    Intervention  Provide education about signs/symptoms and action to take for hypo/hyperglycemia.;Provide education about proper nutrition, including hydration, and aerobic/resistive exercise prescription along with prescribed medications to achieve blood glucose in normal ranges: Fasting glucose 65-99 mg/dL    Expected Outcomes  Short Term: Participant verbalizes understanding of the signs/symptoms and immediate care of hyper/hypoglycemia, proper foot care and importance of medication, aerobic/resistive exercise and nutrition plan for blood glucose control.;Long Term: Attainment of HbA1C < 7%.    Hypertension  Yes    Intervention  Provide education on lifestyle modifcations including regular physical activity/exercise, weight management, moderate sodium restriction and increased consumption of fresh fruit, vegetables, and low fat dairy, alcohol moderation, and smoking cessation.;Monitor prescription use compliance.    Expected Outcomes  Short Term: Continued assessment and intervention until BP is < 140/941mHG in hypertensive participants. < 130/8016mG in hypertensive participants with diabetes, heart failure or chronic kidney disease.;Long Term: Maintenance of blood pressure at goal levels.    Lipids  Yes    Intervention  Provide education and support for  participant on nutrition & aerobic/resistive exercise along with prescribed medications to achieve LDL <57m57mDL >40mg27m Expected Outcomes  Short Term: Participant states understanding of desired cholesterol values and is compliant with medications prescribed. Participant is following exercise prescription and nutrition guidelines.;Long Term: Cholesterol controlled with medications as prescribed, with individualized exercise RX and with personalized nutrition plan. Value goals: LDL < 57mg,33m > 40 mg.        Personal Goals Discharge: Goals and Risk Factor Review - 12/24/18 1557      Core Components/Risk Factors/Patient Goals Review   Personal Goals Review  Weight Management/Obesity;Improve shortness of breath with ADL's;Diabetes;Hypertension;Stress    Review  Keondrick has been losing weight since the start of the program. He has lost another three pounds and is trying to walk down to rehab instead of using his scooter. He has been able to do more and seems to have more energy. He is managing his diabetes on his own and has no questions about it.  His blood pressure has also improved  since  the start of the program. Jaelin is has 7 more sessions to complete the program.    Expected Outcomes  Short: graduate [facility]. Long: maintain exercise post [facility].      Exercise Goals and Review: Exercise Goals    Row Name 08/07/18 1624             Exercise Goals   Increase Physical Activity  Yes       Intervention  Provide advice, education, support and counseling about physical activity/exercise needs.;Develop an individualized exercise prescription for aerobic and resistive training based on initial evaluation findings, risk stratification, comorbidities and participant's personal goals.       Expected Outcomes  Short Term: Attend rehab on a regular basis to increase amount of physical activity.;Long Term: Add in home exercise to make exercise part of routine and to increase amount of physical  activity.;Long Term: Exercising regularly at least 3-5 days a week.       Increase Strength and Stamina  Yes       Intervention  Provide advice, education, support and counseling about physical activity/exercise needs.;Develop an individualized exercise prescription for aerobic and resistive training based on initial evaluation findings, risk stratification, comorbidities and participant's personal goals.       Expected Outcomes  Short Term: Increase workloads from initial exercise prescription for resistance, speed, and METs.;Short Term: Perform resistance training exercises routinely during rehab and add in resistance training at home;Long Term: Improve cardiorespiratory fitness, muscular endurance and strength as measured by increased METs and functional capacity (6MWT)       Able to understand and use rate of perceived exertion (RPE) scale  Yes       Intervention  Provide education and explanation on how to use RPE scale       Expected Outcomes  Short Term: Able to use RPE daily in rehab to express subjective intensity level;Long Term:  Able to use RPE to guide intensity level when exercising independently       Able to understand and use Dyspnea scale  Yes       Intervention  Provide education and explanation on how to use Dyspnea scale       Expected Outcomes  Short Term: Able to use Dyspnea scale daily in rehab to express subjective sense of shortness of breath during exertion;Long Term: Able to use Dyspnea scale to guide intensity level when exercising independently       Knowledge and understanding of Target Heart Rate Range (THRR)  Yes       Intervention  Provide education and explanation of THRR including how the numbers were predicted and where they are located for reference       Expected Outcomes  Short Term: Able to state/look up THRR;Short Term: Able to use daily as guideline for intensity in rehab;Long Term: Able to use THRR to govern intensity when exercising independently       Able  to check pulse independently  Yes       Intervention  Provide education and demonstration on how to check pulse in carotid and radial arteries.;Review the importance of being  able to check your own pulse for safety during independent exercise       Expected Outcomes  Short Term: Able to explain why pulse checking is important during independent exercise;Long Term: Able to check pulse independently and accurately       Understanding of Exercise Prescription  Yes       Intervention  Provide education, explanation, and written materials on patient's individual exercise prescription       Expected Outcomes  Short Term: Able to explain program exercise prescription;Long Term: Able to explain home exercise prescription to exercise independently          Exercise Goals Re-Evaluation: Exercise Goals Re-Evaluation    Row Name 08/17/18 1022 09/05/18 1150 09/19/18 1258 09/24/18 1057 10/03/18 1253     Exercise Goal Re-Evaluation   Exercise Goals Review  Increase Physical Activity;Increase Strength and Stamina;Able to understand and use rate of perceived exertion (RPE) scale;Able to understand and use Dyspnea scale;Able to check pulse independently;Understanding of Exercise Prescription  Increase Physical Activity;Increase Strength and Stamina;Able to understand and use rate of perceived exertion (RPE) scale;Able to understand and use Dyspnea scale  Increase Physical Activity;Increase Strength and Stamina;Able to understand and use rate of perceived exertion (RPE) scale;Able to understand and use Dyspnea scale  Increase Physical Activity;Increase Strength and Stamina;Able to understand and use rate of perceived exertion (RPE) scale;Able to understand and use Dyspnea scale;Knowledge and understanding of Target Heart Rate Range (THRR);Able to check pulse independently;Understanding of Exercise Prescription  Increase Physical Activity;Increase Strength and Stamina;Able to understand and use rate of perceived exertion  (RPE) scale;Able to understand and use Dyspnea scale;Understanding of Exercise Prescription   Comments  Reviewed RPE scale, THR and program prescription with pt today.  Pt voiced understanding and was given a copy of goals to take home.   Devarious has just started LW.  He needs to attend consistently to see progress.  He tolerates exercise well when he attends.  Milt last session was 10/14.  Regular attendnace is needed for progression  Reviewed home exercise with pt today.  Pt plans to walk at home for exercise.  Reviewed THR, pulse, RPE, sign and symptoms, NTG use, and when to call 911 or MD.  Also discussed weather considerations and indoor options.  Pt voiced understanding.  Treyshawn is up to level 10 on Biostep.  Staff will ask if hed like to change to another machine.  He has been consistent attending the past 2 weeks.   Expected Outcomes  Short: Use RPE daily to regulate intensity. Long: Follow program prescription in THR.  Short - attend consistently Long - increase MET level  Short - attend consistently Long - increase MET level  Short: Add 1-2 days of walking at home on days [place] does not come to rehab. Long: Become independent with exercise program.   Short - change Biostep to more challenging piece Long - increase MET level   Row Name 10/17/18 1225 10/31/18 1235 11/27/18 1247 12/12/18 1131 12/26/18 1513     Exercise Goal Re-Evaluation   Exercise Goals Review  Increase Physical Activity;Increase Strength and Stamina;Able to understand and use rate of perceived exertion (RPE) scale;Able to understand and use Dyspnea scale;Knowledge and understanding of Target Heart Rate Range (THRR)  Increase Physical Activity;Increase Strength and Stamina;Able to understand and use rate of perceived exertion (RPE) scale;Able to understand and use Dyspnea scale;Knowledge and understanding of Target Heart Rate Range (THRR);Understanding of Exercise Prescription  Increase Physical Activity;Increase Strength and Stamina;Able to  understand  and use rate of perceived exertion (RPE) scale;Able to understand and use Dyspnea scale;Knowledge and understanding of Target Heart Rate Range (THRR);Understanding of Exercise Prescription  Increase Physical Activity;Increase Strength and Stamina;Able to understand and use rate of perceived exertion (RPE) scale;Knowledge and understanding of Target Heart Rate Range (THRR);Able to check pulse independently;Able to understand and use Dyspnea scale;Understanding of Exercise Prescription  Increase Physical Activity;Increase Strength and Stamina;Able to understand and use rate of perceived exertion (RPE) scale;Able to understand and use Dyspnea scale;Knowledge and understanding of Target Heart Rate Range (THRR);Understanding of Exercise Prescription   Comments  Eura has shown good progress and has improved MET level.  He has attended consistently this month.  Hilton has increased overall endurance since beginning LW.  He has increased resistance on machines and is able to complete 45 minutes continuously.  Marquie continues to progress well and is down to 292 lb.   He has a procedure for another medical issue coming up and will keep staff informed of attendance.  Kjell is progressing well and has improved MET level.  He has missed some sessions due to other medical issues.  Maggie reduced workloads after a minor surgical procedure.  He will work back up to full speed as he can.   Expected Outcomes  Short - work on endurance on TM Long - increase MET level  Short - attend 3 times per week Long - increase MET level  Short - continue to attend LW  Long - increase overall MET level  Short - continue with current level upon return Long - increase MET level  Short - attend consistently Long - increase MET level   Row Name 01/08/19 0833             Exercise Goal Re-Evaluation   Exercise Goals Review  Increase Physical Activity;Increase Strength and Stamina;Able to understand and use rate of perceived exertion (RPE)  scale;Able to understand and use Dyspnea scale;Knowledge and understanding of Target Heart Rate Range (THRR);Able to check pulse independently;Understanding of Exercise Prescription       Comments  Reyhan improved his walk test by 495 feet.  He has improved overall MET level and endurance.       Expected Outcomes  Short - complete LW program Long - maintain fitness on his own          Nutrition & Weight - Outcomes: Pre Biometrics - 08/07/18 1625      Pre Biometrics   Height  5' 7.6" (1.717 m)    Weight  (!) 306 lb 14.4 oz (139.2 kg)    Waist Circumference  47.5 inches    Hip Circumference  51 inches    Waist to Hip Ratio  0.93 %    BMI (Calculated)  47.22    Single Leg Stand  0 seconds      Post Biometrics - 01/07/19 1108       Post  Biometrics   Height  5' 7.6" (1.717 m)    Weight  272 lb (123.4 kg)    Waist Circumference  46 inches    Hip Circumference  50 inches    Waist to Hip Ratio  0.92 %    BMI (Calculated)  41.85       Nutrition: Nutrition Therapy & Goals - 08/29/18 1203      Nutrition Therapy   Diet  DM    Drug/Food Interactions  Statins/Certain Fruits    Protein (specify units)  12oz    Fiber  35 grams  Whole Grain Foods  3 servings   chooses whole grain breads   Saturated Fats  15 max. grams    Fruits and Vegetables  6 servings/day   8 ideal, eats fruits and vegetables daily   Sodium  1500 grams      Personal Nutrition Goals   Nutrition Goal  Practice eating on a consistent schedule each day. For times when you are not hungry for a meal, it may be helpful to at least have a snack with protein + a carb serving to best manage BG control. This is especially important in the evenings    Personal Goal #2  Add a protein and/or fiber source to meals that are carb-heavy such as cereal to prevent hyperglycemia    Personal Goal #3  Eat things like chocolate in moderation and choose sugar-free varieties when possible    Comments  He does not follow a diabetic diet  but does try to monitor CHO intake and not eat sweets other than chocolate (sometimes SF). he is lactose intolerant and tries not to add salt to foods, though does eat fried foods. Reports BG control over the past month to be fair-poor. Eats whole wheat bread, a variety of fruits and vegetables, and nuts. Chooses SF beverages. Reports to skip meals occasionally d/t lack of hunger      Intervention Plan   Intervention  Prescribe, educate and counsel regarding individualized specific dietary modifications aiming towards targeted core components such as weight, hypertension, lipid management, diabetes, heart failure and other comorbidities.    Expected Outcomes  Short Term Goal: Understand basic principles of dietary content, such as calories, fat, sodium, cholesterol and nutrients.;Short Term Goal: A plan has been developed with personal nutrition goals set during dietitian appointment.;Long Term Goal: Adherence to prescribed nutrition plan.       Nutrition Discharge: Nutrition Assessments - 12/31/18 1031      MEDFICTS Scores   Pre Score  46    Post Score  60    Score Difference  14       Education Questionnaire Score: Knowledge Questionnaire Score - 12/31/18 1032      Knowledge Questionnaire Score   Pre Score  16/18    Post Score  16/18   test reviewed with pt today      Goals reviewed with patient; copy given to patient.

## 2019-01-14 NOTE — Progress Notes (Signed)
Pulmonary Individual Treatment Plan  Patient Details  Name: Ricky Johnston MRN: [id number] Date of Birth: [birth year] Referring Provider:     Pulmonary Rehab from 08/07/2018 in [facility] Cardiac and Pulmonary Re[facility]  Referring Provider  Suzie Portela MD [[facility]]      Initial Encounter Date:    Pulmonary Rehab from 08/07/2018 in [facility] Cardiac and Pulmonary Rehab  Date  08/07/18      Visit Diagnosis: Chronic obstructive pulmonary disease, unspecified COPD type ([place])  Patient's Home Medications on Admission:  Current Outpatient Medications:  .  acetaminophen (TYLENOL) 325 MG tablet, Take 3 tablets (975 mg total) by mouth every 8 (eight) hours as needed for mild pain. (Patient taking differently: Take 975 mg by mouth 2 (two) times daily as needed for mild pain. ), Disp: , Rfl:  .  albuterol (PROVENTIL HFA;VENTOLIN HFA) 108 (90 Base) MCG/ACT inhaler, Inhale 2 puffs into the lungs every 6 (six) hours as needed for wheezing or shortness of breath., Disp: , Rfl:  .  ammonium lactate (LAC-HYDRIN) 12 % lotion, Apply 1 application topically daily as needed for dry skin., Disp: , Rfl:  .  aspirin 81 MG chewable tablet, Chew 1 tablet (81 mg total) by mouth 2 (two) times daily after a meal., Disp: 60 tablet, Rfl: 0 .  atorvastatin (LIPITOR) 80 MG tablet, Take 80 mg by mouth at bedtime., Disp: , Rfl:  .  budesonide-formoterol (SYMBICORT) 160-4.5 MCG/ACT inhaler, Inhale 2 puffs into the lungs 2 (two) times daily., Disp: , Rfl:  .  cetirizine (ZYRTEC) 10 MG tablet, Take 10 mg by mouth daily as needed for allergies., Disp: , Rfl:  .  cholecalciferol (VITAMIN D) 1000 UNITS tablet, Take 1,000 Units by mouth daily. , Disp: , Rfl:  .  dextrose (GLUTOSE) 40 % GEL, Take by mouth., Disp: , Rfl:  .  ferrous sulfate 325 (65 FE) MG tablet, Take 325 mg by mouth daily. , Disp: , Rfl:  .  fluticasone (FLONASE) 50 MCG/ACT nasal spray, , Disp: , Rfl:  .  furosemide (LASIX) 20 MG tablet, Take 40 mg by mouth daily. , Disp: , Rfl:   .  gabapentin (NEURONTIN) 300 MG capsule, Take 600 mg by mouth 2 (two) times daily. , Disp: , Rfl:  .  HUMULIN R U-500 KWIKPEN 500 UNIT/ML kwikpen, , Disp: , Rfl:  .  hydrocortisone 2.5 % lotion, Apply topically., Disp: , Rfl:  .  JARDIANCE 10 MG TABS tablet, , Disp: , Rfl:  .  ketoconazole (NIZORAL) 2 % shampoo, Apply topically., Disp: , Rfl:  .  losartan (COZAAR) 25 MG tablet, Take 25 mg by mouth daily., Disp: , Rfl:  .  metFORMIN (GLUCOPHAGE) 1000 MG tablet, Take 1,000 mg by mouth 2 (two) times daily with a meal., Disp: , Rfl:  .  methocarbamol (ROBAXIN) 500 MG tablet, Take 1 tablet (500 mg total) by mouth every 6 (six) hours as needed for muscle spasms., Disp: 60 tablet, Rfl: 0 .  nitrofurantoin, macrocrystal-monohydrate, (MACROBID) 100 MG capsule, , Disp: , Rfl:  .  omeprazole (PRILOSEC) 20 MG capsule, Take 20 mg by mouth 2 (two) times daily before a meal., Disp: , Rfl:  .  OXYGEN, Inhale 1 L into the lungs continuous. At night time may increase to 2 L as needed for shortness of breath, Disp: , Rfl:  .  rOPINIRole (REQUIP) 0.5 MG tablet, Take 0.5 mg by mouth at bedtime., Disp: , Rfl:  .  selenium sulfide (SELSUN) 2.5 % shampoo, Apply topically., Disp: ,  Rfl:  .  senna-docusate (SENOKOT-S) 8.6-50 MG tablet, Take by mouth., Disp: , Rfl:  .  sodium chloride (ALTAMIST SPRAY) 0.65 % nasal spray, Place into the nose., Disp: , Rfl:  .  SUMAtriptan 6 MG/0.5ML SOAJ, Inject into the skin., Disp: , Rfl:  .  SUPER B COMPLEX/C PO, Take 1 tablet by mouth 2 (two) times daily., Disp: , Rfl:  .  tamsulosin (FLOMAX) 0.4 MG CAPS capsule, Take 0.4 mg by mouth daily., Disp: , Rfl:  .  tiotropium (SPIRIVA HANDIHALER) 18 MCG inhalation capsule, Place 18 mcg into inhaler and inhale daily., Disp: , Rfl:   Past Medical History: Past Medical History:  Diagnosis Date  . Arthritis   . Cancer [facility])    bladder cancer currently 2016  . Complication of anesthesia    hard for him to wake up from Anesthesia from left  nephrectomy  . COPD (chronic obstructive pulmonary disease) ([place])   . Diabetes mellitus without complication ([place])   . DVT (deep venous thrombosis) ([place]) 01/08/2014, 2010   upper extremity  . GERD (gastroesophageal reflux disease)   . Hallux limitus 05/18/2015   from notes from [place]   . Headache    migraines  . History of kidney stones   . Hypercholesterolemia   . Hypertension   . Impaired hearing   . Intervertebral disc syndrome   . Kidney stones   . Pulmonary fibrosis ([place])   . Renal calculi 01/08/2014   frrom noted from [facility] .in chart  . Restless legs 01/08/2014  . Sciatic leg pain    paralysis of sciatic nerve  . Sleep apnea    CPAP/BIPAP  . Tinnitus     Tobacco Use: Social History   Tobacco Use  Smoking Status Former Smoker  . Packs/day: 1.00  . Years: 33.00  . Pack years: 33.00  . Types: Cigarettes  . Last attempt to quit: 04/06/2000  . Years since quitting: 18.7  Smokeless Tobacco Never Used  Tobacco Comment   use to chew cigars    Labs: Recent Review Flowsheet Data    Labs for ITP Cardiac and Pulmonary Rehab Latest Ref Rng & Units 01/08/2015 02/10/2017 02/10/2017 02/11/2017   Hemoglobin A1c 4.8 - 5.6 % 8.1(H) - - -   PHART 7.350 - 7.450 - 7.181(LL) 7.217(L) 7.298(L)   PCO2ART 32.0 - 48.0 mmHg - 82.8(HH) 73.1(HH) 62.5(H)   HCO3 20.0 - 28.0 mmol/L - 29.8(H) 28.6(H) 29.4(H)   ACIDBASEDEF 0.0 - 2.0 mmol/L - 0.7 0.8 -   O2SAT % - 98.5 93.5 96.1       Pulmonary Assessment Scores: Pulmonary Assessment Scores    Row Name 08/07/18 1558 12/31/18 1032 01/07/19 1027     ADL UCSD   ADL Phase  Entry  Exit  Exit   SOB Score total  75  22  22   Rest  3  0  0   Walk  5  0  0   Stairs  5  0  0   Bath  '4  1  1   '$ Dress  '4  1  1   '$ Shop  0  1  1     CAT Score   CAT Score  34  24  24     mMRC Score   mMRC Score  4  -  1      Pulmonary Function Assessment: Pulmonary Function Assessment - 08/07/18 1523      Initial Spirometry Results   FVC%  31 %     FEV1%  35 %    FEV1/FVC Ratio  85.68    Comments  good patient effort      Post Bronchodilator Spirometry Results   FVC%  32.24 %    FEV1%  37.8 %    FEV1/FVC Ratio  89.05    Comments  good patient effort       Breath   Bilateral Breath Sounds  Clear;Decreased    Shortness of Breath  Limiting activity;Yes;Fear of Shortness of Breath       Exercise Target Goals: Exercise Program Goal: Individual exercise prescription set using results from initial 6 min walk test and THRR while considering  patient's activity barriers and safety.   Exercise Prescription Goal: Initial exercise prescription builds to 30-45 minutes a day of aerobic activity, 2-3 days per week.  Home exercise guidelines will be given to patient during program as part of exercise prescription that the participant will acknowledge.  Activity Barriers & Risk Stratification: Activity Barriers & Cardiac Risk Stratification - 08/07/18 1621      Activity Barriers & Cardiac Risk Stratification   Activity Barriers  Shortness of Breath;Balance Concerns;History of Falls;Left Hip Replacement;Assistive Device;Muscular Weakness;Deconditioning;Back Problems;Joint Problems   DJD, chronic knee pain      6 Minute Walk: 6 Minute Walk    Row Name 08/07/18 1618 01/07/19 1032       6 Minute Walk   Phase  Initial  Discharge    Distance  555 feet  1050 feet    Distance % Change  -  89 %    Distance Feet Change  -  495 ft    Walk Time  4.9 minutes  6 minutes    # of Rest Breaks  1 1:06  0    MPH  1.29  1.99    METS  1.07  2.78    RPE  15  17    Perceived Dyspnea   4  3    VO2 Peak  3.75  9.72    Symptoms  Yes (comment)  No    Comments  SOB, using rollator for support   used rollator for support    Resting HR  79 bpm  109 bpm    Resting BP  146/64  128/62    Resting Oxygen Saturation   90 %  94 %    Exercise Oxygen Saturation  during 6 min walk  87 %  83 %    Max Ex. HR  124 bpm  150 bpm    Max Ex. BP  156/64  176/68     2 Minute Post BP  150/56  -      Interval HR   1 Minute HR  110  114    2 Minute HR  117  98    3 Minute HR  121  105    4 Minute HR  106  110    5 Minute HR  112  98    6 Minute HR  124  150    2 Minute Post HR  100  116    Interval Heart Rate?  Yes  Yes      Interval Oxygen   Interval Oxygen?  Yes  Yes    Baseline Oxygen Saturation %  90 %  94 %    1 Minute Oxygen Saturation %  89 %  88 %    1 Minute Liters of Oxygen  8 L continuous  4  L    2 Minute Oxygen Saturation %  91 %  87 %    2 Minute Liters of Oxygen  8 L  4 L    3 Minute Oxygen Saturation %  89 % rest break 2:51-4:07  85 %    3 Minute Liters of Oxygen  8 L  4 L    4 Minute Oxygen Saturation %  92 %  84 %    4 Minute Liters of Oxygen  8 L  4 L    5 Minute Oxygen Saturation %  95 %  83 %    5 Minute Liters of Oxygen  8 L  4 L    6 Minute Oxygen Saturation %  89 % 87% immediately post  83 %    6 Minute Liters of Oxygen  8 L  4 L    2 Minute Post Oxygen Saturation %  96 %  92 %    2 Minute Post Liters of Oxygen  8 L  4 L      Oxygen Initial Assessment: Oxygen Initial Assessment - 08/07/18 1520      Home Oxygen   Home Oxygen Device  Home Concentrator;E-Tanks    Sleep Oxygen Prescription  BiPAP;Continuous    Liters per minute  3    Home Exercise Oxygen Prescription  Continuous    Liters per minute  3    Home at Rest Exercise Oxygen Prescription  Continuous    Liters per minute  3    Compliance with Home Oxygen Use  Yes      Initial 6 min Walk   Oxygen Used  Continuous;E-Tanks    Liters per minute  3      Program Oxygen Prescription   Program Oxygen Prescription  E-Tanks;Continuous    Liters per minute  8      Intervention   Short Term Goals  To learn and exhibit compliance with exercise, home and travel O2 prescription;To learn and understand importance of monitoring SPO2 with pulse oximeter and demonstrate accurate use of the pulse oximeter.;To learn and understand importance of maintaining oxygen  saturations>88%;To learn and demonstrate proper pursed lip breathing techniques or other breathing techniques.;To learn and demonstrate proper use of respiratory medications    Long  Term Goals  Exhibits compliance with exercise, home and travel O2 prescription;Verbalizes importance of monitoring SPO2 with pulse oximeter and return demonstration;Maintenance of O2 saturations>88%;Exhibits proper breathing techniques, such as pursed lip breathing or other method taught during program session;Compliance with respiratory medication;Demonstrates proper use of MDI's       Oxygen Re-Evaluation: Oxygen Re-Evaluation    Row Name 08/17/18 1023 09/10/18 1023 09/24/18 1025 11/07/18 1417 11/19/18 1320     Program Oxygen Prescription   Program Oxygen Prescription  -  E-Tanks;Continuous  E-Tanks;Continuous  E-Tanks;Continuous  Continuous;E-Tanks   Liters per minute  -  '4  4  4  4   '$ Comments  -  patient has been doing very well on 4 liters of oxygen while exercising.  -  -  -     Home Oxygen   Home Oxygen Device  -  Home Concentrator;E-Tanks  Home Concentrator;E-Tanks  Home Concentrator;E-Tanks  Portable Concentrator   Sleep Oxygen Prescription  -  BiPAP;Continuous  BiPAP;Continuous  BiPAP;Continuous  BiPAP   Liters per minute  -  '3  3  3  3   '$ Home Exercise Oxygen Prescription  -  Continuous  Continuous  Continuous  Continuous   Liters per minute  -  $'3  3  3  3   'C$ Home at Rest Exercise Oxygen Prescription  -  Continuous  Continuous  Continuous  Continuous   Liters per minute  -  '3  3  3  3   '$ Compliance with Home Oxygen Use  -  Yes  Yes  Yes  Yes     Goals/Expected Outcomes   Short Term Goals  To learn and understand importance of monitoring SPO2 with pulse oximeter and demonstrate accurate use of the pulse oximeter.;To learn and understand importance of maintaining oxygen saturations>88%;To learn and demonstrate proper pursed lip breathing techniques or other breathing techniques.  To learn and exhibit  compliance with exercise, home and travel O2 prescription;To learn and understand importance of monitoring SPO2 with pulse oximeter and demonstrate accurate use of the pulse oximeter.;To learn and understand importance of maintaining oxygen saturations>88%;To learn and demonstrate proper pursed lip breathing techniques or other breathing techniques.;To learn and demonstrate proper use of respiratory medications  To learn and exhibit compliance with exercise, home and travel O2 prescription;To learn and understand importance of monitoring SPO2 with pulse oximeter and demonstrate accurate use of the pulse oximeter.;To learn and understand importance of maintaining oxygen saturations>88%;To learn and demonstrate proper pursed lip breathing techniques or other breathing techniques.;To learn and demonstrate proper use of respiratory medications  To learn and exhibit compliance with exercise, home and travel O2 prescription;To learn and understand importance of monitoring SPO2 with pulse oximeter and demonstrate accurate use of the pulse oximeter.;To learn and understand importance of maintaining oxygen saturations>88%;To learn and demonstrate proper pursed lip breathing techniques or other breathing techniques.;To learn and demonstrate proper use of respiratory medications  -   Long  Term Goals  Verbalizes importance of monitoring SPO2 with pulse oximeter and return demonstration;Maintenance of O2 saturations>88%;Exhibits proper breathing techniques, such as pursed lip breathing or other method taught during program session  Exhibits compliance with exercise, home and travel O2 prescription;Verbalizes importance of monitoring SPO2 with pulse oximeter and return demonstration;Maintenance of O2 saturations>88%;Exhibits proper breathing techniques, such as pursed lip breathing or other method taught during program session;Compliance with respiratory medication;Demonstrates proper use of MDI's  Exhibits compliance with  exercise, home and travel O2 prescription;Verbalizes importance of monitoring SPO2 with pulse oximeter and return demonstration;Maintenance of O2 saturations>88%;Exhibits proper breathing techniques, such as pursed lip breathing or other method taught during program session;Compliance with respiratory medication;Demonstrates proper use of MDI's  Exhibits compliance with exercise, home and travel O2 prescription;Verbalizes importance of monitoring SPO2 with pulse oximeter and return demonstration;Maintenance of O2 saturations>88%;Exhibits proper breathing techniques, such as pursed lip breathing or other method taught during program session;Compliance with respiratory medication;Demonstrates proper use of MDI's  -   Comments  Reviewed PLB technique with pt.  Talked about how it work and it's important to maintaining his exercise saturations.    Patient is doing well with PLB on exertion. Pink is able to walk further distances now since the start of the program. He is doing well on 4 liters of oxygen instead of 8 liters for exercise. His oxygen is is above 88 percent when exercising.  Patient is checking his oxygen at home routinely. He knows he needs to be 88 percent and above. He is taking his breathing medications properly. He wants to try to get off oxygen completly.  Patient states that he is doing well with his oxygen at home. He has liquid oxygen now at home and the company and his doctor have told him he is  supposed to use more. He has had to use 6 liters of oxygen when on the treadmill but is able to use 4 liters when he is on sit down equipment with no desaturation issues. Patient states that he is supposed to use 12 liters of oxygen when he is at home. Patients oxygen does very well when he is on 4 to 6 liters when exercising.  -   Goals/Expected Outcomes  Short: Become more profiecient at using PLB.   Long: Become independent at using PLB.  Short: check oxygen at home more on exertion. Long: reduce  oxygen below 4 liters for exercise.  Short: move down to 3 liters for sit down equipment. Long: get off oxygen for exercise.  Short: ask his doctor why he should be on 12 liters. Long: keep oxygen saturations above 88 percent with less oxygen.  -   [place] Name 12/19/18 1418             Program Oxygen Prescription   Program Oxygen Prescription  Continuous;E-Tanks       Liters per minute  4         Home Oxygen   Home Oxygen Device  Portable Concentrator       Sleep Oxygen Prescription  BiPAP       Liters per minute  3       Home Exercise Oxygen Prescription  Continuous       Liters per minute  3       Home at Rest Exercise Oxygen Prescription  Continuous       Liters per minute  3       Compliance with Home Oxygen Use  Yes         Goals/Expected Outcomes   Short Term Goals  To learn and exhibit compliance with exercise, home and travel O2 prescription;To learn and understand importance of monitoring SPO2 with pulse oximeter and demonstrate accurate use of the pulse oximeter.;To learn and understand importance of maintaining oxygen saturations>88%;To learn and demonstrate proper pursed lip breathing techniques or other breathing techniques.;To learn and demonstrate proper use of respiratory medications       Long  Term Goals  Exhibits compliance with exercise, home and travel O2 prescription;Verbalizes importance of monitoring SPO2 with pulse oximeter and return demonstration;Maintenance of O2 saturations>88%;Exhibits proper breathing techniques, such as pursed lip breathing or other method taught during program session;Compliance with respiratory medication;Demonstrates proper use of MDI's       Comments  Patient has been using his PLB and is utilizing it outside of class. He is going to his pulmonologist this week for a follow up. He is going to try to walk down to class instead of using his scooter. Informed patient that he can rest at benches and catch his breath if he needs to. He was  worried about his balance but says that he can use a walker.       Goals/Expected Outcomes  Short: use walker to get get to class. Long: walk with walker independently to get around.          Oxygen Discharge (Final Oxygen Re-Evaluation): Oxygen Re-Evaluation - 12/19/18 1418      Program Oxygen Prescription   Program Oxygen Prescription  Continuous;E-Tanks    Liters per minute  4      Home Oxygen   Home Oxygen Device  Portable Concentrator    Sleep Oxygen Prescription  BiPAP    Liters per minute  3    Home Exercise Oxygen  Prescription  Continuous    Liters per minute  3    Home at Rest Exercise Oxygen Prescription  Continuous    Liters per minute  3    Compliance with Home Oxygen Use  Yes      Goals/Expected Outcomes   Short Term Goals  To learn and exhibit compliance with exercise, home and travel O2 prescription;To learn and understand importance of monitoring SPO2 with pulse oximeter and demonstrate accurate use of the pulse oximeter.;To learn and understand importance of maintaining oxygen saturations>88%;To learn and demonstrate proper pursed lip breathing techniques or other breathing techniques.;To learn and demonstrate proper use of respiratory medications    Long  Term Goals  Exhibits compliance with exercise, home and travel O2 prescription;Verbalizes importance of monitoring SPO2 with pulse oximeter and return demonstration;Maintenance of O2 saturations>88%;Exhibits proper breathing techniques, such as pursed lip breathing or other method taught during program session;Compliance with respiratory medication;Demonstrates proper use of MDI's    Comments  Patient has been using his PLB and is utilizing it outside of class. He is going to his pulmonologist this week for a follow up. He is going to try to walk down to class instead of using his scooter. Informed patient that he can rest at benches and catch his breath if he needs to. He was worried about his balance but says that he  can use a walker.    Goals/Expected Outcomes  Short: use walker to get get to class. Long: walk with walker independently to get around.       Initial Exercise Prescription: Initial Exercise Prescription - 08/07/18 1600      Date of Initial Exercise RX and Referring Provider   Date  08/07/18    Referring Provider  Suzie Portela MD   [facility]     Oxygen   Oxygen  Continuous    Liters  8-10      Treadmill   MPH  1    Grade  0    Minutes  15    METs  1.77      NuStep   Level  1    SPM  80    Minutes  15    METs  1.7      Biostep-RELP   Level  1    SPM  60    Minutes  15    METs  2      Prescription Details   Frequency (times per week)  3    Duration  Progress to 45 minutes of aerobic exercise without signs/symptoms of physical distress      Intensity   THRR 40-80% of Max Heartrate  109-138    Ratings of Perceived Exertion  11-13    Perceived Dyspnea  0-4      Progression   Progression  Continue to progress workloads to maintain intensity without signs/symptoms of physical distress.      Resistance Training   Training Prescription  Yes    Weight  4 lbs    Reps  10-15       Perform Capillary Blood Glucose checks as needed.  Exercise Prescription Changes: Exercise Prescription Changes    Row Name 08/07/18 1600 08/22/18 1200 09/05/18 1100 09/19/18 1200 09/24/18 1000     Response to Exercise   Blood Pressure (Admit)  146/64  124/64  134/68  142/68  -   Blood Pressure (Exercise)  156/64  142/64  -  -  -   Blood Pressure (Exit)  150/56  126/68  128/60  130/82  -   Heart Rate (Admit)  79 bpm  104 bpm  110 bpm  94 bpm  -   Heart Rate (Exercise)  124 bpm  115 bpm  114 bpm  128 bpm  -   Heart Rate (Exit)  94 bpm  101 bpm  110 bpm  116 bpm  -   Oxygen Saturation (Admit)  90 %  94 %  89 %  94 %  -   Oxygen Saturation (Exercise)  87 %  95 %  88 %  86 %  -   Oxygen Saturation (Exit)  97 %  92 %  88 %  94 %  -   Rating of Perceived Exertion (Exercise)  '15  13  13  13   '$ -   Perceived Dyspnea (Exercise)  4  0  1  1  -   Symptoms  SOB  -  -  -  -   Comments  walk test results  -  -  -  -   Duration  -  Progress to 45 minutes of aerobic exercise without signs/symptoms of physical distress  Progress to 45 minutes of aerobic exercise without signs/symptoms of physical distress  Progress to 45 minutes of aerobic exercise without signs/symptoms of physical distress  Progress to 45 minutes of aerobic exercise without signs/symptoms of physical distress   Intensity  -  THRR unchanged  THRR unchanged  THRR unchanged  THRR unchanged     Progression   Progression  -  Continue to progress workloads to maintain intensity without signs/symptoms of physical distress.  Continue to progress workloads to maintain intensity without signs/symptoms of physical distress.  Continue to progress workloads to maintain intensity without signs/symptoms of physical distress.  Continue to progress workloads to maintain intensity without signs/symptoms of physical distress.   Average METs  -  1.76  1.8  1.9  1.9     Resistance Training   Training Prescription  -  Yes  Yes  Yes  Yes   Weight  -  4 lb  4 lb  4 lb  4 lb   Reps  -  10-15  10-15  10-15  10-15     Interval Training   Interval Training  -  -  No  -  -     Treadmill   MPH  -  -  1  -  -   Grade  -  -  0  -  -   Minutes  -  -  15  -  -   METs  -  -  1.77  -  -     NuStep   Level  -  -  '1  1  1   '$ SPM  -  -  80  80  80   Minutes  -  -  '15  15  15   '$ METs  -  -  1.'8  2  2     '$ Biostep-RELP   Level  -  -  -  3  3   SPM  -  -  -  50  50   Minutes  -  -  -  15  15   METs  -  -  -  2  2     Home Exercise Plan   Plans to continue exercise at  -  -  -  -  Home (comment) walking at  home   Frequency  -  -  -  -  Add 2 additional days to program exercise sessions.   Initial Home Exercises Provided  -  -  -  -  09/24/18   Row Name 10/03/18 1200 10/17/18 1200 10/31/18 1200 11/13/18 1100 11/27/18 1200     Response to Exercise    Blood Pressure (Admit)  130/64  128/70  130/62  144/56  142/82   Blood Pressure (Exit)  132/68  126/64  122/70  138/60  122/70   Heart Rate (Admit)  114 bpm  110 bpm  104 bpm  104 bpm  94 bpm   Heart Rate (Exercise)  113 bpm  137 bpm  125 bpm  107 bpm  116 bpm   Heart Rate (Exit)  64 bpm  111 bpm  93 bpm  97 bpm  87 bpm   Oxygen Saturation (Admit)  92 %  89 %  89 %  91 %  93 %   Oxygen Saturation (Exercise)  92 %  86 % inc to 6L - rested til above 90  88 %  85 %  88 %   Oxygen Saturation (Exit)  96 %  94 %  94 %  91 %  93 %   Rating of Perceived Exertion (Exercise)  '11  15  13  13  13   '$ Perceived Dyspnea (Exercise)  '1  4  2  2  2   '$ Duration  Continue with 45 min of aerobic exercise without signs/symptoms of physical distress.  Continue with 45 min of aerobic exercise without signs/symptoms of physical distress.  Continue with 45 min of aerobic exercise without signs/symptoms of physical distress.  Continue with 45 min of aerobic exercise without signs/symptoms of physical distress.  Continue with 45 min of aerobic exercise without signs/symptoms of physical distress.   Intensity  THRR unchanged  THRR unchanged  THRR unchanged  THRR unchanged  THRR unchanged     Progression   Progression  Continue to progress workloads to maintain intensity without signs/symptoms of physical distress.  Continue to progress workloads to maintain intensity without signs/symptoms of physical distress.  Continue to progress workloads to maintain intensity without signs/symptoms of physical distress.  Continue to progress workloads to maintain intensity without signs/symptoms of physical distress.  Continue to progress workloads to maintain intensity without signs/symptoms of physical distress.   Average METs  1.96  2.'4  2  2  2     '$ Resistance Training   Training Prescription  Yes  Yes  Yes  Yes  Yes   Weight  4 lb  4 lb  4 lb  4 lb  4 lb   Reps  10-15  10-15  10-15  10-15  10-15     Interval Training   Interval  Training  No  No  No  No  No     Treadmill   MPH  -  '1  1  1  1   '$ Grade  -  0  0  0  0   Minutes  -  '15  15  15  15   '$ METs  -  1.77  1.77  1.77  1.77     NuStep   Level  4  -  '6  6  3   '$ SPM  80  -  80  80  80   Minutes  15  -  '15  15  15   '$ METs  2.3  -  2  2  2.5     Biostep-RELP   Level  '10  12  12  12  10   '$ SPM  50  50  50  50  50   Minutes  '15  15  15  15  15   '$ METs  '3  3  2  2  2     '$ Home Exercise Plan   Plans to continue exercise at  Home (comment) walking at home  Home (comment) walking at home  Home (comment) walking at home  Home (comment) walking at home  Home (comment) walking at home   Frequency  Add 2 additional days to program exercise sessions.  Add 2 additional days to program exercise sessions.  Add 2 additional days to program exercise sessions.  Add 2 additional days to program exercise sessions.  Add 2 additional days to program exercise sessions.   Initial Home Exercises Provided  09/24/18  09/24/18  09/24/18  09/24/18  -   [place] Name 12/12/18 1100 12/26/18 1500 01/08/19 0800         Response to Exercise   Blood Pressure (Admit)  112/52  132/50  128/62     Blood Pressure (Exit)  124/70  108/62  120/60     Heart Rate (Admit)  63 bpm  80 bpm  71 bpm     Heart Rate (Exercise)  115 bpm  128 bpm  150 bpm     Heart Rate (Exit)  84 bpm  87 bpm  80 bpm     Oxygen Saturation (Admit)  94 %  95 %  94 %     Oxygen Saturation (Exercise)  88 %  89 %  83 %     Oxygen Saturation (Exit)  93 %  95 %  92 %     Rating of Perceived Exertion (Exercise)  '13  13  17     '$ Perceived Dyspnea (Exercise)  '3  3  3     '$ Duration  Continue with 45 min of aerobic exercise without signs/symptoms of physical distress.  Continue with 45 min of aerobic exercise without signs/symptoms of physical distress.  Continue with 45 min of aerobic exercise without signs/symptoms of physical distress.     Intensity  THRR unchanged  THRR unchanged  THRR unchanged       Progression   Progression  Continue  to progress workloads to maintain intensity without signs/symptoms of physical distress.  Continue to progress workloads to maintain intensity without signs/symptoms of physical distress.  Continue to progress workloads to maintain intensity without signs/symptoms of physical distress.     Average METs  2.8  2.4 due to surgery   -       Resistance Training   Training Prescription  Yes  Yes  No     Weight  4 lb  4 lb  4 lb     Reps  10-15  10-15  10-15       Interval Training   Interval Training  No  -  -       NuStep   Level  6  -  -     SPM  80  -  -     Minutes  15  -  -     METs  2.6  -  -       Arm Ergometer   Level  -  -  2     Minutes  -  -  15     METs  -  -  1.8       Biostep-RELP   Level  10  -  10     SPM  50  -  80     Minutes  15  -  15     METs  3  -  3       Home Exercise Plan   Plans to continue exercise at  Home (comment) walking at home  -  -     Frequency  Add 2 additional days to program exercise sessions.  -  -        Exercise Comments: Exercise Comments    Row Name 08/17/18 1021 01/14/19 1009         Exercise Comments   First full day of exercise!  Patient was oriented to gym and equipment including functions, settings, policies, and procedures.  Patient's individual exercise prescription and treatment plan were reviewed.  All starting workloads were established based on the results of the 6 minute walk test done at initial orientation visit.  The plan for exercise progression was also introduced and progression will be customized based on patient's performance and goals   Ricky Johnston graduated today from  rehab with 36 sessions completed.  Details of the patient's exercise prescription and what He needs to do in order to continue the prescription and progress were discussed with patient.  Patient was given a copy of prescription and goals.  Patient verbalized understanding.  Ricky Johnston plans to continue to exercise by using exercise equipment he has at home.          Exercise Goals and Review: Exercise Goals    Row Name 08/07/18 1624             Exercise Goals   Increase Physical Activity  Yes       Intervention  Provide advice, education, support and counseling about physical activity/exercise needs.;Develop an individualized exercise prescription for aerobic and resistive training based on initial evaluation findings, risk stratification, comorbidities and participant's personal goals.       Expected Outcomes  Short Term: Attend rehab on a regular basis to increase amount of physical activity.;Long Term: Add in home exercise to make exercise part of routine and to increase amount of physical activity.;Long Term: Exercising regularly at least 3-5 days a week.       Increase Strength and Stamina  Yes       Intervention  Provide advice, education, support and counseling about physical activity/exercise needs.;Develop an individualized exercise prescription for aerobic and resistive training based on initial evaluation findings, risk stratification, comorbidities and participant's personal goals.       Expected Outcomes  Short Term: Increase workloads from initial exercise prescription for resistance, speed, and METs.;Short Term: Perform resistance training exercises routinely during rehab and add in resistance training at home;Long Term: Improve cardiorespiratory fitness, muscular endurance and strength as measured by increased METs and functional capacity (6MWT)       Able to understand and use rate of perceived exertion (RPE) scale  Yes       Intervention  Provide education and explanation on how to use RPE scale       Expected Outcomes  Short Term: Able to use RPE daily in rehab to express subjective intensity level;Long Term:  Able to use RPE to guide intensity level when exercising independently       Able to understand and use Dyspnea scale  Yes  Intervention  Provide education and explanation on how to use Dyspnea scale       Expected  Outcomes  Short Term: Able to use Dyspnea scale daily in rehab to express subjective sense of shortness of breath during exertion;Long Term: Able to use Dyspnea scale to guide intensity level when exercising independently       Knowledge and understanding of Target Heart Rate Range (THRR)  Yes       Intervention  Provide education and explanation of THRR including how the numbers were predicted and where they are located for reference       Expected Outcomes  Short Term: Able to state/look up THRR;Short Term: Able to use daily as guideline for intensity in rehab;Long Term: Able to use THRR to govern intensity when exercising independently       Able to check pulse independently  Yes       Intervention  Provide education and demonstration on how to check pulse in carotid and radial arteries.;Review the importance of being able to check your own pulse for safety during independent exercise       Expected Outcomes  Short Term: Able to explain why pulse checking is important during independent exercise;Long Term: Able to check pulse independently and accurately       Understanding of Exercise Prescription  Yes       Intervention  Provide education, explanation, and written materials on patient's individual exercise prescription       Expected Outcomes  Short Term: Able to explain program exercise prescription;Long Term: Able to explain home exercise prescription to exercise independently          Exercise Goals Re-Evaluation : Exercise Goals Re-Evaluation    Row Name 08/17/18 1022 09/05/18 1150 09/19/18 1258 09/24/18 1057 10/03/18 1253     Exercise Goal Re-Evaluation   Exercise Goals Review  Increase Physical Activity;Increase Strength and Stamina;Able to understand and use rate of perceived exertion (RPE) scale;Able to understand and use Dyspnea scale;Able to check pulse independently;Understanding of Exercise Prescription  Increase Physical Activity;Increase Strength and Stamina;Able to understand  and use rate of perceived exertion (RPE) scale;Able to understand and use Dyspnea scale  Increase Physical Activity;Increase Strength and Stamina;Able to understand and use rate of perceived exertion (RPE) scale;Able to understand and use Dyspnea scale  Increase Physical Activity;Increase Strength and Stamina;Able to understand and use rate of perceived exertion (RPE) scale;Able to understand and use Dyspnea scale;Knowledge and understanding of Target Heart Rate Range (THRR);Able to check pulse independently;Understanding of Exercise Prescription  Increase Physical Activity;Increase Strength and Stamina;Able to understand and use rate of perceived exertion (RPE) scale;Able to understand and use Dyspnea scale;Understanding of Exercise Prescription   Comments  Reviewed RPE scale, THR and program prescription with pt today.  Pt voiced understanding and was given a copy of goals to take home.   Ricky Johnston has just started LW.  He needs to attend consistently to see progress.  He tolerates exercise well when he attends.  Ricky Johnston last session was 10/14.  Regular attendnace is needed for progression  Reviewed home exercise with pt today.  Pt plans to walk at home for exercise.  Reviewed THR, pulse, RPE, sign and symptoms, NTG use, and when to call 911 or MD.  Also discussed weather considerations and indoor options.  Pt voiced understanding.  Demarquez is up to level 10 on Biostep.  Staff will ask if hed like to change to another machine.  He has been consistent attending the past 2 weeks.  Expected Outcomes  Short: Use RPE daily to regulate intensity. Long: Follow program prescription in THR.  Short - attend consistently Long - increase MET level  Short - attend consistently Long - increase MET level  Short: Add 1-2 days of walking at home on days Ricky Johnston does not come to rehab. Long: Become independent with exercise program.   Short - change Biostep to more challenging piece Long - increase MET level   Row Name 10/17/18 1225 10/31/18  1235 11/27/18 1247 12/12/18 1131 12/26/18 1513     Exercise Goal Re-Evaluation   Exercise Goals Review  Increase Physical Activity;Increase Strength and Stamina;Able to understand and use rate of perceived exertion (RPE) scale;Able to understand and use Dyspnea scale;Knowledge and understanding of Target Heart Rate Range (THRR)  Increase Physical Activity;Increase Strength and Stamina;Able to understand and use rate of perceived exertion (RPE) scale;Able to understand and use Dyspnea scale;Knowledge and understanding of Target Heart Rate Range (THRR);Understanding of Exercise Prescription  Increase Physical Activity;Increase Strength and Stamina;Able to understand and use rate of perceived exertion (RPE) scale;Able to understand and use Dyspnea scale;Knowledge and understanding of Target Heart Rate Range (THRR);Understanding of Exercise Prescription  Increase Physical Activity;Increase Strength and Stamina;Able to understand and use rate of perceived exertion (RPE) scale;Knowledge and understanding of Target Heart Rate Range (THRR);Able to check pulse independently;Able to understand and use Dyspnea scale;Understanding of Exercise Prescription  Increase Physical Activity;Increase Strength and Stamina;Able to understand and use rate of perceived exertion (RPE) scale;Able to understand and use Dyspnea scale;Knowledge and understanding of Target Heart Rate Range (THRR);Understanding of Exercise Prescription   Comments  Liahm has shown good progress and has improved MET level.  He has attended consistently this month.  Ricky Johnston has increased overall endurance since beginning LW.  He has increased resistance on machines and is able to complete 45 minutes continuously.  Ricky Johnston continues to progress well and is down to 292 lb.   He has a procedure for another medical issue coming up and will keep staff informed of attendance.  Ricky Johnston is progressing well and has improved MET level.  He has missed some sessions due to other  medical issues.  Ricky Johnston reduced workloads after a minor surgical procedure.  He will work back up to full speed as he can.   Expected Outcomes  Short - work on endurance on TM Long - increase MET level  Short - attend 3 times per week Long - increase MET level  Short - continue to attend LW  Long - increase overall MET level  Short - continue with current level upon return Long - increase MET level  Short - attend consistently Long - increase MET level   Row Name 01/08/19 0833             Exercise Goal Re-Evaluation   Exercise Goals Review  Increase Physical Activity;Increase Strength and Stamina;Able to understand and use rate of perceived exertion (RPE) scale;Able to understand and use Dyspnea scale;Knowledge and understanding of Target Heart Rate Range (THRR);Able to check pulse independently;Understanding of Exercise Prescription       Comments  Ricky Johnston improved his walk test by 495 feet.  He has improved overall MET level and endurance.       Expected Outcomes  Short - complete LW program Long - maintain fitness on his own          Discharge Exercise Prescription (Final Exercise Prescription Changes): Exercise Prescription Changes - 01/08/19 0800      Response to Exercise  Blood Pressure (Admit)  128/62    Blood Pressure (Exit)  120/60    Heart Rate (Admit)  71 bpm    Heart Rate (Exercise)  150 bpm    Heart Rate (Exit)  80 bpm    Oxygen Saturation (Admit)  94 %    Oxygen Saturation (Exercise)  83 %    Oxygen Saturation (Exit)  92 %    Rating of Perceived Exertion (Exercise)  17    Perceived Dyspnea (Exercise)  3    Duration  Continue with 45 min of aerobic exercise without signs/symptoms of physical distress.    Intensity  THRR unchanged      Progression   Progression  Continue to progress workloads to maintain intensity without signs/symptoms of physical distress.      Resistance Training   Training Prescription  No    Weight  4 lb    Reps  10-15      Arm Ergometer   Level   2    Minutes  15    METs  1.8      Biostep-RELP   Level  10    SPM  80    Minutes  15    METs  3       Nutrition:  Target Goals: Understanding of nutrition guidelines, daily intake of sodium '1500mg'$ , cholesterol '200mg'$ , calories 30% from fat and 7% or less from saturated fats, daily to have 5 or more servings of fruits and vegetables.  Biometrics: Pre Biometrics - 08/07/18 1625      Pre Biometrics   Height  5' 7.6" (1.717 m)    Weight  (!) 306 lb 14.4 oz (139.2 kg)    Waist Circumference  47.5 inches    Hip Circumference  51 inches    Waist to Hip Ratio  0.93 %    BMI (Calculated)  47.22    Single Leg Stand  0 seconds      Post Biometrics - 01/07/19 1108       Post  Biometrics   Height  5' 7.6" (1.717 m)    Weight  272 lb (123.4 kg)    Waist Circumference  46 inches    Hip Circumference  50 inches    Waist to Hip Ratio  0.92 %    BMI (Calculated)  41.85       Nutrition Therapy Plan and Nutrition Goals: Nutrition Therapy & Goals - 08/29/18 1203      Nutrition Therapy   Diet  DM    Drug/Food Interactions  Statins/Certain Fruits    Protein (specify units)  12oz    Fiber  35 grams    Whole Grain Foods  3 servings   chooses whole grain breads   Saturated Fats  15 max. grams    Fruits and Vegetables  6 servings/day   8 ideal, eats fruits and vegetables daily   Sodium  1500 grams      Personal Nutrition Goals   Nutrition Goal  Practice eating on a consistent schedule each day. For times when you are not hungry for a meal, it may be helpful to at least have a snack with protein + a carb serving to best manage BG control. This is especially important in the evenings    Personal Goal #2  Add a protein and/or fiber source to meals that are carb-heavy such as cereal to prevent hyperglycemia    Personal Goal #3  Eat things like chocolate in moderation and choose sugar-free varieties  when possible    Comments  He does not follow a diabetic diet but does try to monitor CHO  intake and not eat sweets other than chocolate (sometimes SF). he is lactose intolerant and tries not to add salt to foods, though does eat fried foods. Reports BG control over the past month to be fair-poor. Eats whole wheat bread, a variety of fruits and vegetables, and nuts. Chooses SF beverages. Reports to skip meals occasionally d/t lack of hunger      Intervention Plan   Intervention  Prescribe, educate and counsel regarding individualized specific dietary modifications aiming towards targeted core components such as weight, hypertension, lipid management, diabetes, heart failure and other comorbidities.    Expected Outcomes  Short Term Goal: Understand basic principles of dietary content, such as calories, fat, sodium, cholesterol and nutrients.;Short Term Goal: A plan has been developed with personal nutrition goals set during dietitian appointment.;Long Term Goal: Adherence to prescribed nutrition plan.       Nutrition Assessments: Nutrition Assessments - 12/31/18 1031      MEDFICTS Scores   Pre Score  46    Post Score  60    Score Difference  14       Nutrition Goals Re-Evaluation: Nutrition Goals Re-Evaluation    Row Name 08/29/18 1234 10/01/18 1135 11/12/18 1038 12/19/18 1423       Goals   Current Weight  -  -  -  290 lb (131.5 kg)    Nutrition Goal  Practice eating on a consistent schedule each day. For times when you are not hungry for a meal, it may be helpful to at least have a snack with protein + a carb serving to best manage BG control. This is especially important in the evenings  Practice eating on a consistent schedule each day; add a protein and/or fiber source at meals; eat things like chocolate in moderation and choose sugar free varieties when possible  Practice eating on a consistent schedule each day; add a protein and/or fiber source; eat things like chocolate in moderation  Ricky Johnston still wants to lose weight and he knows it will help him get around and breath  easier.     Comment  He skips meals occasionally d/t lack of hunger. Reports to have a small snack like 1/2 pack of PB crackers rather than a meal sometimes but may also eat nothing. Last night he skipped dinner and did not eat again until before class today  Pt's wife has been helpful in facilitating new eating habits/ schedule for pt. He feel that he is now eating on a more consistent schedule each day and is offered at least a protein at meal times. This morning for breakfast he had an egg, sausage and cheese sandwich rather than cereal. His wife has also stopped buying chocolate and other sweets  With the help of his wife he has been able to eat on a more consistent schedule and is eating more fruit. He is not snacking as much and has been including protein sources at meal times  Jazmine states that his diet has been good but his wife sometimes hands him food and tells him to eat it. He stated that he had a piece of cheescake even though he does not like cheescake but the cherrys on top are the reason he ate it.. Informed him that sometimes he needs to say no to food that he doesnt want.    Expected Outcome  He will eat a snack  that includes a carb source + a protein source in place of meals if not hungry to avoid going long periods of time without eating to improve BG control. He will do his best to eat on a consistent schedule each day  He will continue to eat 3 meals/day on a regular schedule with snacks as needed and include protien/fiber rich options. He will find alternatives for high-sugar snacks such as chocolate, as he does not think he will be able to cut these foods out completely.  He will continue to eat on a regular/ consistent schedule and to include sources of lean protein regularly. He will continue to eat more fruit and to limit non-nutritious snacks. Long term goal: eat more vegetables  Short: watch diet more closely. Long: lose 5lbs before the end of [facility].      Personal Goal #2  Re-Evaluation   Personal Goal #2  Add a protein and/or fiber source to meals that are carb-heavy such as cereal to prevent hyperglycemia  -  -  -      Personal Goal #3 Re-Evaluation   Personal Goal #3  Eat things like chocolate in moderation and choose sugar free varieties when possible  -  -  -       Nutrition Goals Discharge (Final Nutrition Goals Re-Evaluation): Nutrition Goals Re-Evaluation - 12/19/18 1423      Goals   Current Weight  290 lb (131.5 kg)    Nutrition Goal  Ricky Johnston still wants to lose weight and he knows it will help him get around and breath easier.     Comment  Ricky Johnston states that his diet has been good but his wife sometimes hands him food and tells him to eat it. He stated that he had a piece of cheescake even though he does not like cheescake but the cherrys on top are the reason he ate it.. Informed him that sometimes he needs to say no to food that he doesnt want.    Expected Outcome  Short: watch diet more closely. Long: lose 5lbs before the end of [facility].       Psychosocial: Target Goals: Acknowledge presence or absence of significant depression and/or stress, maximize coping skills, provide positive support system. Participant is able to verbalize types and ability to use techniques and skills needed for reducing stress and depression.   Initial Review & Psychosocial Screening: Initial Psych Review & Screening - 08/07/18 1523      Initial Review   Current issues with  Current Sleep Concerns;Current Stress Concerns    Source of Stress Concerns  Chronic Illness;Unable to perform yard/household activities    Comments  The patient is not mobile and his mother is 7 and is making life stressful for him.      Family Dynamics   Good Support System?  Yes    Comments  He can look to his wifes family for support.      Barriers   Psychosocial barriers to participate in program  There are no identifiable barriers or psychosocial needs.;The patient should benefit from  training in stress management and relaxation.      Screening Interventions   Interventions  Encouraged to exercise;Program counselor consult;Provide feedback about the scores to participant;To provide support and resources with identified psychosocial needs    Expected Outcomes  Short Term goal: Utilizing psychosocial counselor, staff and physician to assist with identification of specific Stressors or current issues interfering with healing process. Setting desired goal for each stressor or current  issue identified.;Long Term Goal: Stressors or current issues are controlled or eliminated.;Short Term goal: Identification and review with participant of any Quality of Life or Depression concerns found by scoring the questionnaire.;Long Term goal: The participant improves quality of Life and PHQ9 Scores as seen by post scores and/or verbalization of changes       Quality of Life Scores:  Scores of 19 and below usually indicate a poorer quality of life in these areas.  A difference of  2-3 points is a clinically meaningful difference.  A difference of 2-3 points in the total score of the Quality of Life Index has been associated with significant improvement in overall quality of life, self-image, physical symptoms, and general health in studies assessing change in quality of life.  PHQ-9: Recent Review Flowsheet Data    Depression screen [facility] 2/9 12/31/2018 09/28/2018 08/07/2018   Decreased Interest 0 0 1   Down, Depressed, Hopeless 0 0 2   PHQ - 2 Score 0 0 3   Altered sleeping 0 0 2   Tired, decreased energy '1 1 2   '$ Change in appetite 0 0 0   Feeling bad or failure about yourself  0 0 0   Trouble concentrating 0 0 0   Moving slowly or fidgety/restless 0 0 0   Suicidal thoughts 0 0 0   PHQ-9 Score '1 1 7   '$ Difficult doing work/chores Not difficult at all Not difficult at all Very difficult     Interpretation of Total Score  Total Score Depression Severity:  1-4 = Minimal depression, 5-9 = Mild  depression, 10-14 = Moderate depression, 15-19 = Moderately severe depression, 20-27 = Severe depression   Psychosocial Evaluation and Intervention: Psychosocial Evaluation - 08/29/18 1131      Psychosocial Evaluation & Interventions   Interventions  Stress management education;Relaxation education;Encouraged to exercise with the program and follow exercise prescription    Comments  Counselor met with Mr. Aden Garfield) today for initial psychosocial evaluation.  He is a 68 year old who has COPD; as well as diabetes; sleep apnea and a recent cancer survivor.  Kawon has a strong support system with a spouse of 18 years; (2) step children with positive relationships and (2) half sisters.  Kallum reports sleeping fairly well in a recliner for approximately 8-9 hours most nights.  He uses a CPAP for this.  He reports his appetite is "too good" and would like to lose some weight.  Ventura denies a history of depression or anxiety or any current symptoms and is typically in a positive mood.  However, Tristyn reports his 57 year old mother in [place] is dying and this has been stressful and painful for him - impacting his mood and sleep at times.  Counselor processed this with Ricky Johnston and provided support - offering a grief support group if needed.  Vint has goals to improve his balance and oxygen levels and possibly lose some weight while in this program.     Expected Outcomes  Short:  Isayah will exercise for his health and mental health as a positive coping strategy.  He will meet with the dietician for a healthy nutrition plan.  Ricky Johnston will inform this counselor if needs further information/support through this time of grief/loss with his mother's illness.  Long:  Ricky Johnston will develop a pattern of healthy lifestyle choices for his health and mental health.      Continue Psychosocial Services   Follow up required by staff  Psychosocial Re-Evaluation: Psychosocial Re-Evaluation    [place]ger Name 09/24/18 1103 11/07/18 1422  11/12/18 1130 01/02/19 1357       Psychosocial Re-Evaluation   Current issues with  Current Stress Concerns;Current Sleep Concerns  Current Stress Concerns;Current Sleep Concerns  Current Stress Concerns  Current Stress Concerns    Comments  Patient is using his BIPAP machine all the time now and states he is sleeping like a rock. His mom is in bad shape and he will be stressed until she passes. His moms health and memory are going down hill. He gets down about his health and sees how his health has declined. He thinks about the past when he was in the [employer] and it makes him aggravated that he cannot do the things he used to. He wants to get back in shape and be healthy again. Ricky Johnston has a good attitude and is ready to make a change.  Derrin states things are going very except for his bladder. He states that his doctor found a red dot on bladder and it could be cancer again, They are going to run a biopsy to see if is is malignant. Darnel is in high spirits and is willing to do what it takes to get healthier.  Counselor follow up with Ricky Johnston today reporting he has seen some progress in his balance and core strength since coming into this program and is using his cane less as a result.  Counselor commended him for this progress.  He states his mother who is "dying" is about the same and hanging on - which is helpful for Kamare to cope with this.  He reports having the flu recently but recovered much better than in the past and was pleasantly surprised by this.   Staff will continue to follow with Ricky Johnston - he has a positive mood and is a pleasure to work with.   Ricky Johnston that his mood is good and his bladder cancer is doing better. He is not bleeding from his bladder like he was. He is going to workout at home when th program is done. He has had a good time in the program and has a positive outlook for the future.    Expected Outcomes  Short: attend [facility] regularly. Long: maintain exercise to decrease stress.  Short:  Attend [facility] stress management education to decrease stress. Long: Maintain exercise Post [facility] to keep stress at a minimum  Short:  Casmere will continue to exercise and practice positive self-care for his health and to decrease his stress.  Long:  Ricky Johnston will continue to maintain a positive attitude while practicing improved self-care and engaging in a consistent exercise routine.   Short: Attend [facility] stress management education to decrease stress. Long: Maintain exercise Post [facility] to keep stress at a minimum.    Interventions  Encouraged to attend Pulmonary Rehabilitation for the exercise  Encouraged to attend Pulmonary Rehabilitation for the exercise  -  Encouraged to attend Pulmonary Rehabilitation for the exercise    Continue Psychosocial Services   Follow up required by staff  Follow up required by staff  Follow up required by staff  Follow up required by staff      Initial Review   Source of Stress Concerns  Chronic Illness;Unable to perform yard/household activities  -  -  -       Psychosocial Discharge (Final Psychosocial Re-Evaluation): Psychosocial Re-Evaluation - 01/02/19 1357      Psychosocial Re-Evaluation   Current issues with  Current  Stress Concerns    Comments  Ricky Johnston that his mood is good and his bladder cancer is doing better. He is not bleeding from his bladder like he was. He is going to workout at home when th program is done. He has had a good time in the program and has a positive outlook for the future.    Expected Outcomes  Short: Attend [facility] stress management education to decrease stress. Long: Maintain exercise Post [facility] to keep stress at a minimum.    Interventions  Encouraged to attend Pulmonary Rehabilitation for the exercise    Continue Psychosocial Services   Follow up required by staff       Education: Education Goals: Education classes will be provided on a weekly basis, covering required topics. Participant will state  understanding/return demonstration of topics presented.  Learning Barriers/Preferences: Learning Barriers/Preferences - 08/07/18 1526      Learning Barriers/Preferences   Learning Barriers  Hearing    Learning Preferences  None       Education Topics:  Initial Evaluation Education: - Verbal, written and demonstration of respiratory meds, oximetry and breathing techniques. Instruction on use of nebulizers and MDIs and importance of monitoring MDI activations.   Pulmonary Rehab from 01/02/2019 in [facility] Cardiac and Pulmonary Rehab  Date  08/07/18  Educator  [facility]  Instruction Review Code  1- Verbalizes Understanding      General Nutrition Guidelines/Fats and Fiber: -Group instruction provided by verbal, written material, models and posters to present the general guidelines for heart healthy nutrition. Gives an explanation and review of dietary fats and fiber.   Pulmonary Rehab from 01/02/2019 in [facility] Cardiac and Pulmonary Rehab  Date  10/03/18  Educator  [facility]  Instruction Review Code  1- Verbalizes Understanding      Controlling Sodium/Reading Food Labels: -Group verbal and written material supporting the discussion of sodium use in heart healthy nutrition. Review and explanation with models, verbal and written materials for utilization of the food label.   Pulmonary Rehab from 01/02/2019 in [facility] Cardiac and Pulmonary Rehab  Date  10/17/18  Educator  LB  Instruction Review Code  1- Verbalizes Understanding      Exercise Physiology & General Exercise Guidelines: - Group verbal and written instruction with models to review the exercise physiology of the cardiovascular system and associated critical values. Provides general exercise guidelines with specific guidelines to those with heart or lung disease.    Pulmonary Rehab from 01/02/2019 in [facility] Cardiac and Pulmonary Rehab  Date  12/19/18  Educator  [facility]  Instruction Review Code  1- Verbalizes Understanding      Aerobic Exercise &  Resistance Training: - Gives group verbal and written instruction on the various components of exercise. Focuses on aerobic and resistive training programs and the benefits of this training and how to safely progress through these programs.   Flexibility, Balance, Mind/Body Relaxation: Provides group verbal/written instruction on the benefits of flexibility and balance training, including mind/body exercise modes such as yoga, pilates and tai chi.  Demonstration and skill practice provided.   Pulmonary Rehab from 01/02/2019 in [facility] Cardiac and Pulmonary Rehab  Date  12/26/18  Educator  [facility]  Instruction Review Code  1- Verbalizes Understanding      Stress and Anxiety: - Provides group verbal and written instruction about the health risks of elevated stress and causes of high stress.  Discuss the correlation between heart/lung disease and anxiety and treatment options. Review healthy ways to manage with stress and anxiety.  Pulmonary Rehab from 01/02/2019 in [facility] Cardiac and Pulmonary Rehab  Date  10/10/18  Educator  [facility]  Instruction Review Code  1- Verbalizes Understanding      Depression: - Provides group verbal and written instruction on the correlation between heart/lung disease and depressed mood, treatment options, and the stigmas associated with seeking treatment.   Exercise & Equipment Safety: - Individual verbal instruction and demonstration of equipment use and safety with use of the equipment.   Pulmonary Rehab from 01/02/2019 in [facility] Cardiac and Pulmonary Rehab  Date  08/07/18  Educator  [facility]  Instruction Review Code  1- Verbalizes Understanding      Infection Prevention: - Provides verbal and written material to individual with discussion of infection control including proper hand washing and proper equipment cleaning during exercise session.   Pulmonary Rehab from 01/02/2019 in [facility] Cardiac and Pulmonary Rehab  Date  08/07/18  Educator  [facility]  Instruction Review Code  1-  Verbalizes Understanding      Falls Prevention: - Provides verbal and written material to individual with discussion of falls prevention and safety.   Pulmonary Rehab from 01/02/2019 in [facility] Cardiac and Pulmonary Rehab  Date  08/07/18  Educator  [facility]  Instruction Review Code  1- Verbalizes Understanding      Diabetes: - Individual verbal and written instruction to review signs/symptoms of diabetes, desired ranges of glucose level fasting, after meals and with exercise. Advice that pre and post exercise glucose checks will be done for 3 sessions at entry of program.   Chronic Lung Diseases: - Group verbal and written instruction to review updates, respiratory medications, advancements in procedures and treatments. Discuss use of supplemental oxygen including available portable oxygen systems, continuous and intermittent flow rates, concentrators, personal use and safety guidelines. Review proper use of inhaler and spacers. Provide informative websites for self-education.    Energy Conservation: - Provide group verbal and written instruction for methods to conserve energy, plan and organize activities. Instruct on pacing techniques, use of adaptive equipment and posture/positioning to relieve shortness of breath.   Pulmonary Rehab from 01/02/2019 in [facility] Cardiac and Pulmonary Rehab  Date  09/26/18  Educator  [facility]  Instruction Review Code  1- Verbalizes Understanding      Triggers and Exacerbations: - Group verbal and written instruction to review types of environmental triggers and ways to prevent exacerbations. Discuss weather changes, air quality and the benefits of nasal washing. Review warning signs and symptoms to help prevent infections. Discuss techniques for effective airway clearance, coughing, and vibrations.   Pulmonary Rehab from 01/02/2019 in [facility] Cardiac and Pulmonary Rehab  Date  08/29/18  Educator  [facility]  Instruction Review Code  1- Verbalizes Understanding      AED/CPR: -  Group verbal and written instruction with the use of models to demonstrate the basic use of the AED with the basic ABC's of resuscitation.   Anatomy and Physiology of the Lungs: - Group verbal and written instruction with the use of models to provide basic lung anatomy and physiology related to function, structure and complications of lung disease.   Anatomy & Physiology of the Heart: - Group verbal and written instruction and models provide basic cardiac anatomy and physiology, with the coronary electrical and arterial systems. Review of Valvular disease and Heart Failure   Pulmonary Rehab from 01/02/2019 in [facility] Cardiac and Pulmonary Rehab  Date  11/30/18  Educator  [facility]  Instruction Review Code  5- Refused Teaching      Cardiac  Medications: - Group verbal and written instruction to review commonly prescribed medications for heart disease. Reviews the medication, class of the drug, and side effects.   Know Your Numbers and Risk Factors: -Group verbal and written instruction about important numbers in your health.  Discussion of what are risk factors and how they play a role in the disease process.  Review of Cholesterol, Blood Pressure, Diabetes, and BMI and the role they play in your overall health.   Pulmonary Rehab from 01/02/2019 in [facility] Cardiac and Pulmonary Rehab  Date  11/14/18  Educator  [facility]  Instruction Review Code  1- Verbalizes Understanding      Sleep Hygiene: -Provides group verbal and written instruction about how sleep can affect your health.  Define sleep hygiene, discuss sleep cycles and impact of sleep habits. Review good sleep hygiene tips.    Pulmonary Rehab from 01/02/2019 in [facility] Cardiac and Pulmonary Rehab  Date  11/07/18  Educator  [facility]  Instruction Review Code  1- Verbalizes Understanding      Other: -Provides group and verbal instruction on various topics (see comments)    Knowledge Questionnaire Score: Knowledge Questionnaire Score - 12/31/18 1032       Knowledge Questionnaire Score   Pre Score  16/18    Post Score  16/18   test reviewed with pt today       Core Components/Risk Factors/Patient Goals at Admission: Personal Goals and Risk Factors at Admission - 08/07/18 1526      Core Components/Risk Factors/Patient Goals on Admission    Weight Management  Yes;Weight Loss    Intervention  Weight Management: Develop a combined nutrition and exercise program designed to reach desired caloric intake, while maintaining appropriate intake of nutrient and fiber, sodium and fats, and appropriate energy expenditure required for the weight goal.;Weight Management: Provide education and appropriate resources to help participant work on and attain dietary goals.;Weight Management/Obesity: Establish reasonable short term and long term weight goals.;Obesity: Provide education and appropriate resources to help participant work on and attain dietary goals.    Admit Weight  306 lb 14.4 oz (139.2 kg)    Goal Weight: Short Term  301 lb (136.5 kg)    Goal Weight: Long Term  250 lb (113.4 kg)    Expected Outcomes  Short Term: Continue to assess and modify interventions until short term weight is achieved;Long Term: Adherence to nutrition and physical activity/exercise program aimed toward attainment of established weight goal;Understanding recommendations for meals to include 15-35% energy as protein, 25-35% energy from fat, 35-60% energy from carbohydrates, less than '200mg'$  of dietary cholesterol, 20-35 gm of total fiber daily;Understanding of distribution of calorie intake throughout the day with the consumption of 4-5 meals/snacks    Improve shortness of breath with ADL's  Yes    Intervention  Provide education, individualized exercise plan and daily activity instruction to help decrease symptoms of SOB with activities of daily living.    Expected Outcomes  Long Term: Be able to perform more ADLs without symptoms or delay the onset of symptoms;Short Term: Improve  cardiorespiratory fitness to achieve a reduction of symptoms when performing ADLs    Diabetes  Yes    Intervention  Provide education about signs/symptoms and action to take for hypo/hyperglycemia.;Provide education about proper nutrition, including hydration, and aerobic/resistive exercise prescription along with prescribed medications to achieve blood glucose in normal ranges: Fasting glucose 65-99 mg/dL    Expected Outcomes  Short Term: Participant verbalizes understanding of the signs/symptoms and immediate care of  hyper/hypoglycemia, proper foot care and importance of medication, aerobic/resistive exercise and nutrition plan for blood glucose control.;Long Term: Attainment of HbA1C < 7%.    Hypertension  Yes    Intervention  Provide education on lifestyle modifcations including regular physical activity/exercise, weight management, moderate sodium restriction and increased consumption of fresh fruit, vegetables, and low fat dairy, alcohol moderation, and smoking cessation.;Monitor prescription use compliance.    Expected Outcomes  Short Term: Continued assessment and intervention until BP is < 140/53m HG in hypertensive participants. < 130/812mHG in hypertensive participants with diabetes, heart failure or chronic kidney disease.;Long Term: Maintenance of blood pressure at goal levels.    Lipids  Yes    Intervention  Provide education and support for participant on nutrition & aerobic/resistive exercise along with prescribed medications to achieve LDL '70mg'$ , HDL >'40mg'$ .    Expected Outcomes  Short Term: Participant states understanding of desired cholesterol values and is compliant with medications prescribed. Participant is following exercise prescription and nutrition guidelines.;Long Term: Cholesterol controlled with medications as prescribed, with individualized exercise RX and with personalized nutrition plan. Value goals: LDL < '70mg'$ , HDL > 40 mg.       Core Components/Risk Factors/Patient  Goals Review:  Goals and Risk Factor Review    Row Name 09/10/18 1029 09/28/18 1025 11/07/18 1426 11/19/18 1310 12/24/18 1557     Core Components/Risk Factors/Patient Goals Review   Personal Goals Review  Weight Management/Obesity;Improve shortness of breath with ADL's;Hypertension;Lipids;Diabetes  Weight Management/Obesity;Improve shortness of breath with ADL's;Hypertension;Lipids;Diabetes  Weight Management/Obesity;Improve shortness of breath with ADL's;Hypertension;Lipids;Diabetes  Weight Management/Obesity;Improve shortness of breath with ADL's;Develop more efficient breathing techniques such as purse lipped breathing and diaphragmatic breathing and practicing self-pacing with activity.;Diabetes;Hypertension;Stress  Weight Management/Obesity;Improve shortness of breath with ADL's;Diabetes;Hypertension;Stress   Review  PaChungas reached his short term goal of losing 5 pounds. He wants to lose weight and continue to improve his ADL's. He has been checking his sugar at home and [place]  PaLadainianas been working hard and his wife can see a difference in his ability to do things, He has been checking his sugar in the morning which is lower but higher than he wants it to be. His A1C is 7.9. His fasting blood sugar is 192 on average.  Pauls blood pressure has been improving since the start of the program. He is controlling his blood sugar at home and has yet to check his A1c. PaBrandnets short of breath on the treadmill and is one machine that is most difficult for him. PaZimereas lost 5-10 pounds since the start of the program and wants to keep going.   PaNeeleports he is still losing weight - slowly. He is taking meds as directed.  He reports good BP at home and BG is steady.  He walks at home on days not at [employer] [employer] He does report feeling hungry more but eats small portions  He hasnt been sleeping well - he wakes up and has trouble getting back to sleep.  We discussed using tips from Sleep  Hygiene education.  PaNarvelas been losing weight since the start of the program. He has lost another three pounds and is trying to walk down to rehab instead of using his scooter. He has been able to do more and seems to have more energy. He is managing his diabetes on his own and has no questions about it.  His blood pressure has also improved  since  the  start of the program. Nobuo is has 7 more sessions to complete the program.   Expected Outcomes  Short: continue [facility] to lose more weight. Long: lose another 5 pounds in the next month.  Short: work on lowering fasting blood sugar. Long: have an A1c lower than 7.  Short: Attend [facility] regularly to improve shortness of breath with ADL's. Long: maintain independence with ADL's   Short - continue to exercise and eat heathy diet Long - reach goal weight and maintain exercise on his own  Short: graduate [facility]. Long: maintain exercise post [facility].      Core Components/Risk Factors/Patient Goals at Discharge (Final Review):  Goals and Risk Factor Review - 12/24/18 1557      Core Components/Risk Factors/Patient Goals Review   Personal Goals Review  Weight Management/Obesity;Improve shortness of breath with ADL's;Diabetes;Hypertension;Stress    Review  Brantly has been losing weight since the start of the program. He has lost another three pounds and is trying to walk down to rehab instead of using his scooter. He has been able to do more and seems to have more energy. He is managing his diabetes on his own and has no questions about it.  His blood pressure has also improved  since  the start of the program. Jaeson is has 7 more sessions to complete the program.    Expected Outcomes  Short: graduate [place]. Long: maintain exercise post [facility].       ITP Comments: ITP Comments    Row Name 08/07/18 1451 08/20/18 0821 09/17/18 0826 09/17/18 0923 10/15/18 0827   ITP Comments  Medical Evaluation completed. Chart sent for review and changes to Dr.  Emily Filbert Director of [place]. Diagnosis can be found in [facility] media patient is a [place] patient  30 day review completed. ITP sent to Dr. Emily Filbert Director of [place]. Continue with ITP unless changes are made by physician  30 day review completed. ITP sent to Dr. Emily Filbert Director of [place]. Continue with ITP unless changes are made by physician.  Kong states that he hurt is back somehow and will try to return on Wednesday.  30 day review completed. ITP sent to Dr. Emily Filbert Director of [place]. Continue with ITP unless changes are made by physician.   Bartolo Name 11/12/18 0821 12/10/18 0825 01/07/19 0820       ITP Comments  30 day review completed. ITP sent to Dr. Emily Filbert Director of [place]. Continue with ITP unless changes are made by physician.  30 day review completed. ITP sent to Dr. Emily Filbert Director of [place]. Continue with ITP unless changes are made by physician.  30 day review completed. ITP sent to Dr. Emily Filbert Director of [place]. Continue with ITP unless changes are made by physician.        Comments: discharge ITP

## 2019-01-14 NOTE — Progress Notes (Signed)
Discharge Progress Report  Patient Details  Name: Ricky Johnston MRN: [id number] Date of Birth: 25-Sep-1951 Referring Provider:     Pulmonary Rehab from 08/07/2018 in [place] [facility]  Referring Provider  Suzie Portela MD [[facility]]       Number of Visits: 36  Reason for Discharge:  Patient reached a stable level of exercise. Patient independent in their exercise. Patient has met program and personal goals.  Smoking History:  Social History   Tobacco Use  Smoking Status Former Smoker  . Packs/day: 1.00  . Years: 33.00  . Pack years: 33.00  . Types: Cigarettes  . Last attempt to quit: 04/06/2000  . Years since quitting: 18.7  Smokeless Tobacco Never Used  Tobacco Comment   use to chew cigars    Diagnosis:  Chronic obstructive pulmonary disease, unspecified COPD type ([place])  ADL [facility]: Pulmonary Assessment Scores    Row Name 08/07/18 1558 12/31/18 1032 01/07/19 1027     ADL [facility]   ADL Phase  Entry  Exit  Exit   SOB Score total  75  22  22   Rest  3  0  0   Walk  5  0  0   Stairs  5  0  0   Bath  '4  1  1   ' Dress  '4  1  1   ' Shop  0  1  1     CAT Score   CAT Score  34  24  24     mMRC Score   mMRC Score  4  -  1      Initial Exercise Prescription: Initial Exercise Prescription - 08/07/18 1600      Date of Initial Exercise RX and Referring Provider   Date  08/07/18    Referring Provider  Suzie Portela MD   [facility]     Oxygen   Oxygen  Continuous    Liters  8-10      Treadmill   MPH  1    Grade  0    Minutes  15    METs  1.77      NuStep   Level  1    SPM  80    Minutes  15    METs  1.7      Biostep-RELP   Level  1    SPM  60    Minutes  15    METs  2      Prescription Details   Frequency (times per week)  3    Duration  Progress to 45 minutes of aerobic exercise without signs/symptoms of physical distress      Intensity   THRR 40-80% of Max Heartrate  109-138    Ratings of Perceived Exertion  11-13    Perceived Dyspnea   0-4      Progression   Progression  Continue to progress workloads to maintain intensity without signs/symptoms of physical distress.      Resistance Training   Training Prescription  Yes    Weight  4 lbs    Reps  10-15       Discharge Exercise Prescription (Final Exercise Prescription Changes): Exercise Prescription Changes - 01/08/19 0800      Response to Exercise   Blood Pressure (Admit)  128/62    Blood Pressure (Exit)  120/60    Heart Rate (Admit)  71 bpm    Heart Rate (Exercise)  150 bpm    Heart Rate (  Exit)  80 bpm    Oxygen Saturation (Admit)  94 %    Oxygen Saturation (Exercise)  83 %    Oxygen Saturation (Exit)  92 %    Rating of Perceived Exertion (Exercise)  17    Perceived Dyspnea (Exercise)  3    Duration  Continue with 45 min of aerobic exercise without signs/symptoms of physical distress.    Intensity  THRR unchanged      Progression   Progression  Continue to progress workloads to maintain intensity without signs/symptoms of physical distress.      Resistance Training   Training Prescription  No    Weight  4 lb    Reps  10-15      Arm Ergometer   Level  2    Minutes  15    METs  1.8      Biostep-RELP   Level  10    SPM  80    Minutes  15    METs  3       Functional Capacity: 6 Minute Walk    Row Name 08/07/18 1618 01/07/19 1032       6 Minute Walk   Phase  Initial  Discharge    Distance  555 feet  1050 feet    Distance % Change  -  89 %    Distance Feet Change  -  495 ft    Walk Time  4.9 minutes  6 minutes    # of Rest Breaks  1 1:06  0    MPH  1.29  1.99    METS  1.07  2.78    RPE  15  17    Perceived Dyspnea   4  3    VO2 Peak  3.75  9.72    Symptoms  Yes (comment)  No    Comments  SOB, using rollator for support   used rollator for support    Resting HR  79 bpm  109 bpm    Resting BP  146/64  128/62    Resting Oxygen Saturation   90 %  94 %    Exercise Oxygen Saturation  during 6 min walk  87 %  83 %    Max Ex. HR  124 bpm   150 bpm    Max Ex. BP  156/64  176/68    2 Minute Post BP  150/56  -      Interval HR   1 Minute HR  110  114    2 Minute HR  117  98    3 Minute HR  121  105    4 Minute HR  106  110    5 Minute HR  112  98    6 Minute HR  124  150    2 Minute Post HR  100  116    Interval Heart Rate?  Yes  Yes      Interval Oxygen   Interval Oxygen?  Yes  Yes    Baseline Oxygen Saturation %  90 %  94 %    1 Minute Oxygen Saturation %  89 %  88 %    1 Minute Liters of Oxygen  8 L continuous  4 L    2 Minute Oxygen Saturation %  91 %  87 %    2 Minute Liters of Oxygen  8 L  4 L    3 Minute Oxygen Saturation %  89 %  rest break 2:51-4:07  85 %    3 Minute Liters of Oxygen  8 L  4 L    4 Minute Oxygen Saturation %  92 %  84 %    4 Minute Liters of Oxygen  8 L  4 L    5 Minute Oxygen Saturation %  95 %  83 %    5 Minute Liters of Oxygen  8 L  4 L    6 Minute Oxygen Saturation %  89 % 87% immediately post  83 %    6 Minute Liters of Oxygen  8 L  4 L    2 Minute Post Oxygen Saturation %  96 %  92 %    2 Minute Post Liters of Oxygen  8 L  4 L       Psychological, QOL, Others - Outcomes: PHQ 2/9: Depression screen [facility] 2/9 12/31/2018 09/28/2018 08/07/2018  Decreased Interest 0 0 1  Down, Depressed, Hopeless 0 0 2  PHQ - 2 Score 0 0 3  Altered sleeping 0 0 2  Tired, decreased energy '1 1 2  ' Change in appetite 0 0 0  Feeling bad or failure about yourself  0 0 0  Trouble concentrating 0 0 0  Moving slowly or fidgety/restless 0 0 0  Suicidal thoughts 0 0 0  PHQ-9 Score '1 1 7  ' Difficult doing work/chores Not difficult at all Not difficult at all Very difficult    Quality of Life:   Personal Goals: Goals established at orientation with interventions provided to work toward goal. Personal Goals and Risk Factors at Admission - 08/07/18 1526      Core Components/Risk Factors/Patient Goals on Admission    Weight Management  Yes;Weight Loss    Intervention  Weight Management: Develop a combined  nutrition and exercise program designed to reach desired caloric intake, while maintaining appropriate intake of nutrient and fiber, sodium and fats, and appropriate energy expenditure required for the weight goal.;Weight Management: Provide education and appropriate resources to help participant work on and attain dietary goals.;Weight Management/Obesity: Establish reasonable short term and long term weight goals.;Obesity: Provide education and appropriate resources to help participant work on and attain dietary goals.    Admit Weight  306 lb 14.4 oz (139.2 kg)    Goal Weight: Short Term  301 lb (136.5 kg)    Goal Weight: Long Term  250 lb (113.4 kg)    Expected Outcomes  Short Term: Continue to assess and modify interventions until short term weight is achieved;Long Term: Adherence to nutrition and physical activity/exercise program aimed toward attainment of established weight goal;Understanding recommendations for meals to include 15-35% energy as protein, 25-35% energy from fat, 35-60% energy from carbohydrates, less than 266m of dietary cholesterol, 20-35 gm of total fiber daily;Understanding of distribution of calorie intake throughout the day with the consumption of 4-5 meals/snacks    Improve shortness of breath with ADL's  Yes    Intervention  Provide education, individualized exercise plan and daily activity instruction to help decrease symptoms of SOB with activities of daily living.    Expected Outcomes  Long Term: Be able to perform more ADLs without symptoms or delay the onset of symptoms;Short Term: Improve cardiorespiratory fitness to achieve a reduction of symptoms when performing ADLs    Diabetes  Yes    Intervention  Provide education about signs/symptoms and action to take for hypo/hyperglycemia.;Provide education about proper nutrition, including hydration, and aerobic/resistive exercise prescription along with prescribed medications to  achieve blood glucose in normal ranges: Fasting  glucose 65-99 mg/dL    Expected Outcomes  Short Term: Participant verbalizes understanding of the signs/symptoms and immediate care of hyper/hypoglycemia, proper foot care and importance of medication, aerobic/resistive exercise and nutrition plan for blood glucose control.;Long Term: Attainment of HbA1C < 7%.    Hypertension  Yes    Intervention  Provide education on lifestyle modifcations including regular physical activity/exercise, weight management, moderate sodium restriction and increased consumption of fresh fruit, vegetables, and low fat dairy, alcohol moderation, and smoking cessation.;Monitor prescription use compliance.    Expected Outcomes  Short Term: Continued assessment and intervention until BP is < 140/53m HG in hypertensive participants. < 130/855mHG in hypertensive participants with diabetes, heart failure or chronic kidney disease.;Long Term: Maintenance of blood pressure at goal levels.    Lipids  Yes    Intervention  Provide education and support for participant on nutrition & aerobic/resistive exercise along with prescribed medications to achieve LDL <7051mHDL >63m15m  Expected Outcomes  Short Term: Participant states understanding of desired cholesterol values and is compliant with medications prescribed. Participant is following exercise prescription and nutrition guidelines.;Long Term: Cholesterol controlled with medications as prescribed, with individualized exercise RX and with personalized nutrition plan. Value goals: LDL < 70mg30mL > 40 mg.        Personal Goals Discharge: Goals and Risk Factor Review    Row Name 09/10/18 1029 09/28/18 1025 11/07/18 1426 11/19/18 1310 12/24/18 1557     Core Components/Risk Factors/Patient Goals Review   Personal Goals Review  Weight Management/Obesity;Improve shortness of breath with ADL's;Hypertension;Lipids;Diabetes  Weight Management/Obesity;Improve shortness of breath with ADL's;Hypertension;Lipids;Diabetes  Weight  Management/Obesity;Improve shortness of breath with ADL's;Hypertension;Lipids;Diabetes  Weight Management/Obesity;Improve shortness of breath with ADL's;Develop more efficient breathing techniques such as purse lipped breathing and diaphragmatic breathing and practicing self-pacing with activity.;Diabetes;Hypertension;Stress  Weight Management/Obesity;Improve shortness of breath with ADL's;Diabetes;Hypertension;Stress   Review  Niclas Naradareached his short term goal of losing 5 pounds. He wants to lose weight and continue to improve his ADL's. He has been checking his sugar at home and [place] [place] and his wife can see a difference in his ability to do things, He has been checking his sugar in the morning which is lower but higher than he wants it to be. His A1C is 7.9. His fasting blood sugar is 192 on average.  Pauls blood pressure has been improving since the start of the program. He is controlling his blood sugar at home and has yet to check his A1c. Georges Maddox short of breath on the treadmill and is one machine that is most difficult for him. Damon Mckinnonlost 5-10 pounds since the start of the program and wants to keep going.   Hogan Makotorts he is still losing weight - slowly. He is taking meds as directed.  He reports good BP at home and BG is steady.  He walks at home on days not at LW.  [facility] does report feeling hungry more but eats small portions  He hasnt been sleeping well - he wakes up and has trouble getting back to sleep.  We discussed using tips from Sleep Hygiene education.  Rogelio Nechemiabeen losing weight since the start of the program. He has lost another three pounds and is trying to walk down to rehab instead of using his scooter. He has been able to do more and seems to have more  energy. He is managing his diabetes on his own and has no questions about it.  His blood pressure has also improved  since  the start of the program. Della is has 7 more sessions to  complete the program.   Expected Outcomes  Short: continue [facility] to lose more weight. Long: lose another 5 pounds in the next month.  Short: work on lowering fasting blood sugar. Long: have an A1c lower than 7.  Short: Attend [facility] regularly to improve shortness of breath with ADL's. Long: maintain independence with ADL's   Short - continue to exercise and eat heathy diet Long - reach goal weight and maintain exercise on his own  Short: graduate [facility]. Long: maintain exercise post [facility].      Exercise Goals and Review: Exercise Goals    Row Name 08/07/18 1624             Exercise Goals   Increase Physical Activity  Yes       Intervention  Provide advice, education, support and counseling about physical activity/exercise needs.;Develop an individualized exercise prescription for aerobic and resistive training based on initial evaluation findings, risk stratification, comorbidities and participant's personal goals.       Expected Outcomes  Short Term: Attend rehab on a regular basis to increase amount of physical activity.;Long Term: Add in home exercise to make exercise part of routine and to increase amount of physical activity.;Long Term: Exercising regularly at least 3-5 days a week.       Increase Strength and Stamina  Yes       Intervention  Provide advice, education, support and counseling about physical activity/exercise needs.;Develop an individualized exercise prescription for aerobic and resistive training based on initial evaluation findings, risk stratification, comorbidities and participant's personal goals.       Expected Outcomes  Short Term: Increase workloads from initial exercise prescription for resistance, speed, and METs.;Short Term: Perform resistance training exercises routinely during rehab and add in resistance training at home;Long Term: Improve cardiorespiratory fitness, muscular endurance and strength as measured by increased METs and functional capacity  (6MWT)       Able to understand and use rate of perceived exertion (RPE) scale  Yes       Intervention  Provide education and explanation on how to use RPE scale       Expected Outcomes  Short Term: Able to use RPE daily in rehab to express subjective intensity level;Long Term:  Able to use RPE to guide intensity level when exercising independently       Able to understand and use Dyspnea scale  Yes       Intervention  Provide education and explanation on how to use Dyspnea scale       Expected Outcomes  Short Term: Able to use Dyspnea scale daily in rehab to express subjective sense of shortness of breath during exertion;Long Term: Able to use Dyspnea scale to guide intensity level when exercising independently       Knowledge and understanding of Target Heart Rate Range (THRR)  Yes       Intervention  Provide education and explanation of THRR including how the numbers were predicted and where they are located for reference       Expected Outcomes  Short Term: Able to state/look up THRR;Short Term: Able to use daily as guideline for intensity in rehab;Long Term: Able to use THRR to govern intensity when exercising independently       Able to check pulse independently  Yes       Intervention  Provide education and demonstration on how to check pulse in carotid and radial arteries.;Review the importance of being able to check your own pulse for safety during independent exercise       Expected Outcomes  Short Term: Able to explain why pulse checking is important during independent exercise;Long Term: Able to check pulse independently and accurately       Understanding of Exercise Prescription  Yes       Intervention  Provide education, explanation, and written materials on patient's individual exercise prescription       Expected Outcomes  Short Term: Able to explain program exercise prescription;Long Term: Able to explain home exercise prescription to exercise independently          Exercise Goals  Re-Evaluation: Exercise Goals Re-Evaluation    Row Name 08/17/18 1022 09/05/18 1150 09/19/18 1258 09/24/18 1057 10/03/18 1253     Exercise Goal Re-Evaluation   Exercise Goals Review  Increase Physical Activity;Increase Strength and Stamina;Able to understand and use rate of perceived exertion (RPE) scale;Able to understand and use Dyspnea scale;Able to check pulse independently;Understanding of Exercise Prescription  Increase Physical Activity;Increase Strength and Stamina;Able to understand and use rate of perceived exertion (RPE) scale;Able to understand and use Dyspnea scale  Increase Physical Activity;Increase Strength and Stamina;Able to understand and use rate of perceived exertion (RPE) scale;Able to understand and use Dyspnea scale  Increase Physical Activity;Increase Strength and Stamina;Able to understand and use rate of perceived exertion (RPE) scale;Able to understand and use Dyspnea scale;Knowledge and understanding of Target Heart Rate Range (THRR);Able to check pulse independently;Understanding of Exercise Prescription  Increase Physical Activity;Increase Strength and Stamina;Able to understand and use rate of perceived exertion (RPE) scale;Able to understand and use Dyspnea scale;Understanding of Exercise Prescription   Comments  Reviewed RPE scale, THR and program prescription with pt today.  Pt voiced understanding and was given a copy of goals to take home.   Jessee has just started LW.  He needs to attend consistently to see progress.  He tolerates exercise well when he attends.  Lawayne last session was 10/14.  Regular attendnace is needed for progression  Reviewed home exercise with pt today.  Pt plans to walk at home for exercise.  Reviewed THR, pulse, RPE, sign and symptoms, NTG use, and when to call 911 or MD.  Also discussed weather considerations and indoor options.  Pt voiced understanding.  Kemani is up to level 10 on Biostep.  Staff will ask if hed like to change to another machine.  He  has been consistent attending the past 2 weeks.   Expected Outcomes  Short: Use RPE daily to regulate intensity. Long: Follow program prescription in THR.  Short - attend consistently Long - increase MET level  Short - attend consistently Long - increase MET level  Short: Add 1-2 days of walking at home on days Jilberto does not come to rehab. Long: Become independent with exercise program.   Short - change Biostep to more challenging piece Long - increase MET level   Row Name 10/17/18 1225 10/31/18 1235 11/27/18 1247 12/12/18 1131 12/26/18 1513     Exercise Goal Re-Evaluation   Exercise Goals Review  Increase Physical Activity;Increase Strength and Stamina;Able to understand and use rate of perceived exertion (RPE) scale;Able to understand and use Dyspnea scale;Knowledge and understanding of Target Heart Rate Range (THRR)  Increase Physical Activity;Increase Strength and Stamina;Able to understand and use rate of perceived exertion (RPE)  scale;Able to understand and use Dyspnea scale;Knowledge and understanding of Target Heart Rate Range (THRR);Understanding of Exercise Prescription  Increase Physical Activity;Increase Strength and Stamina;Able to understand and use rate of perceived exertion (RPE) scale;Able to understand and use Dyspnea scale;Knowledge and understanding of Target Heart Rate Range (THRR);Understanding of Exercise Prescription  Increase Physical Activity;Increase Strength and Stamina;Able to understand and use rate of perceived exertion (RPE) scale;Knowledge and understanding of Target Heart Rate Range (THRR);Able to check pulse independently;Able to understand and use Dyspnea scale;Understanding of Exercise Prescription  Increase Physical Activity;Increase Strength and Stamina;Able to understand and use rate of perceived exertion (RPE) scale;Able to understand and use Dyspnea scale;Knowledge and understanding of Target Heart Rate Range (THRR);Understanding of Exercise Prescription   Comments   Gabryel has shown good progress and has improved MET level.  He has attended consistently this month.  Rylin has increased overall endurance since beginning LW.  He has increased resistance on machines and is able to complete 45 minutes continuously.  Nafis continues to progress well and is down to 292 lb.   He has a procedure for another medical issue coming up and will keep staff informed of attendance.  Syair is progressing well and has improved MET level.  He has missed some sessions due to other medical issues.  Linley reduced workloads after a minor surgical procedure.  He will work back up to full speed as he can.   Expected Outcomes  Short - work on endurance on TM Long - increase MET level  Short - attend 3 times per week Long - increase MET level  Short - continue to attend LW  Long - increase overall MET level  Short - continue with current level upon return Long - increase MET level  Short - attend consistently Long - increase MET level   Row Name 01/08/19 0833             Exercise Goal Re-Evaluation   Exercise Goals Review  Increase Physical Activity;Increase Strength and Stamina;Able to understand and use rate of perceived exertion (RPE) scale;Able to understand and use Dyspnea scale;Knowledge and understanding of Target Heart Rate Range (THRR);Able to check pulse independently;Understanding of Exercise Prescription       Comments  Guy improved his walk test by 495 feet.  He has improved overall MET level and endurance.       Expected Outcomes  Short - complete LW program Long - maintain fitness on his own          Nutrition & Weight - Outcomes: Pre Biometrics - 08/07/18 1625      Pre Biometrics   Height  5' 7.6" (1.717 m)    Weight  (!) 306 lb 14.4 oz (139.2 kg)    Waist Circumference  47.5 inches    Hip Circumference  51 inches    Waist to Hip Ratio  0.93 %    BMI (Calculated)  47.22    Single Leg Stand  0 seconds      Post Biometrics - 01/07/19 1108       Post  Biometrics    Height  5' 7.6" (1.717 m)    Weight  272 lb (123.4 kg)    Waist Circumference  46 inches    Hip Circumference  50 inches    Waist to Hip Ratio  0.92 %    BMI (Calculated)  41.85       Nutrition: Nutrition Therapy & Goals - 08/29/18 1203      Nutrition Therapy  Diet  DM    Drug/Food Interactions  Statins/Certain Fruits    Protein (specify units)  12oz    Fiber  35 grams    Whole Grain Foods  3 servings   chooses whole grain breads   Saturated Fats  15 max. grams    Fruits and Vegetables  6 servings/day   8 ideal, eats fruits and vegetables daily   Sodium  1500 grams      Personal Nutrition Goals   Nutrition Goal  Practice eating on a consistent schedule each day. For times when you are not hungry for a meal, it may be helpful to at least have a snack with protein + a carb serving to best manage BG control. This is especially important in the evenings    Personal Goal #2  Add a protein and/or fiber source to meals that are carb-heavy such as cereal to prevent hyperglycemia    Personal Goal #3  Eat things like chocolate in moderation and choose sugar-free varieties when possible    Comments  He does not follow a diabetic diet but does try to monitor CHO intake and not eat sweets other than chocolate (sometimes SF). he is lactose intolerant and tries not to add salt to foods, though does eat fried foods. Reports BG control over the past month to be fair-poor. Eats whole wheat bread, a variety of fruits and vegetables, and nuts. Chooses SF beverages. Reports to skip meals occasionally d/t lack of hunger      Intervention Plan   Intervention  Prescribe, educate and counsel regarding individualized specific dietary modifications aiming towards targeted core components such as weight, hypertension, lipid management, diabetes, heart failure and other comorbidities.    Expected Outcomes  Short Term Goal: Understand basic principles of dietary content, such as calories, fat, sodium, cholesterol  and nutrients.;Short Term Goal: A plan has been developed with personal nutrition goals set during dietitian appointment.;Long Term Goal: Adherence to prescribed nutrition plan.       Nutrition Discharge: Nutrition Assessments - 12/31/18 1031      MEDFICTS Scores   Pre Score  46    Post Score  60    Score Difference  14       Education Questionnaire Score: Knowledge Questionnaire Score - 12/31/18 1032      Knowledge Questionnaire Score   Pre Score  16/18    Post Score  16/18   test reviewed with pt today      Goals reviewed with patient; copy given to patient.

## 2019-01-14 NOTE — Progress Notes (Signed)
Daily Session Note  Patient Details  Name: Ricky Johnston MRN: [id number] Date of Birth: 30-Jan-1951 Referring Provider:     Pulmonary Rehab from 08/07/2018 in [facility] Card[facility]  Referring Provider  Suzie Portela MD [[facility]]      Encounter Date: 01/14/2019  Check In: Session Check In - 01/14/19 1006      Check-In   Supervising physician immediately available to respond to emergencies  [facility] immediately available ER MD    Physician(s)  Dr. Cinda Quest and Dr. Alfred Levins    Location  [facility]-Cardiac & Pulmonary Rehab    Staff Present  Earlean Shawl, BS, ACSM CEP, Exercise Physiologist;[facility], [place], ACSM CEP, Exercise Physiologist    Medication changes reported      No    Fall or balance concerns reported     No    Tobacco Cessation  No Change    Warm-up and Cool-down  Performed as group-led instruction    Resistance Training Performed  Yes    VAD Patient?  No    PAD/SET Patient?  No      Pain Assessment   Currently in Pain?  No/denies    Multiple Pain Sites  No          Social History   Tobacco Use  Smoking Status Former Smoker  . Packs/day: 1.00  . Years: 33.00  . Pack years: 33.00  . Types: Cigarettes  . Last attempt to quit: 04/06/2000  . Years since quitting: 18.7  Smokeless Tobacco Never Used  Tobacco Comment   use to chew cigars    Goals Met:  Proper associated with RPD/PD & O2 Sat Independence with exercise equipment Exercise tolerated well No report of cardiac concerns or symptoms Strength training completed today  Goals Unmet:  Not Applicable  Comments:  Ricky Johnston graduated today from  rehab with 36 sessions completed.  Details of the patient's exercise prescription and what He needs to do in order to continue the prescription and progress were discussed with patient.  Patient was given a copy of prescription and goals.  Patient verbalized understanding.  Ricky Johnston plans to continue to exercise by using exercise equipment he  has at home.    Dr. Emily Filbert is Medical Director for [facility] and [facility].

## 2020-02-05 ENCOUNTER — Ambulatory Visit: Payer: Medicare Other | Attending: Internal Medicine

## 2020-02-05 ENCOUNTER — Other Ambulatory Visit: Payer: Self-pay

## 2020-02-05 DIAGNOSIS — Z20822 Contact with and (suspected) exposure to covid-19: Secondary | ICD-10-CM

## 2020-02-06 LAB — NOVEL CORONAVIRUS, NAA: SARS-CoV-2, NAA: NOT DETECTED

## 2021-02-02 ENCOUNTER — Ambulatory Visit (INDEPENDENT_AMBULATORY_CARE_PROVIDER_SITE_OTHER): Payer: No Typology Code available for payment source | Admitting: Podiatry

## 2021-02-02 ENCOUNTER — Encounter: Payer: Self-pay | Admitting: Podiatry

## 2021-02-02 ENCOUNTER — Other Ambulatory Visit: Payer: Self-pay

## 2021-02-02 DIAGNOSIS — M79674 Pain in right toe(s): Secondary | ICD-10-CM

## 2021-02-02 DIAGNOSIS — B351 Tinea unguium: Secondary | ICD-10-CM | POA: Diagnosis not present

## 2021-02-02 DIAGNOSIS — M79675 Pain in left toe(s): Secondary | ICD-10-CM

## 2021-02-02 DIAGNOSIS — Z794 Long term (current) use of insulin: Secondary | ICD-10-CM | POA: Diagnosis not present

## 2021-02-02 DIAGNOSIS — M7751 Other enthesopathy of right foot: Secondary | ICD-10-CM

## 2021-02-02 DIAGNOSIS — E1142 Type 2 diabetes mellitus with diabetic polyneuropathy: Secondary | ICD-10-CM | POA: Diagnosis not present

## 2021-02-02 NOTE — Progress Notes (Signed)
  Subjective:  Patient ID: Ricky Johnston, male    DOB: 23-May-1951,  MRN: [id number]  Chief Complaint  Patient presents with  . Nail Problem    Referred by [facility] for nail trim [facility]   He has neuropathy that gives him problems.  He has numbness, tingling, prickly pains and right ankle pain from surgery in 41   70 y.o. male returns for the above complaint.  Patient presents with thickened elongated dystrophic toenails x10.  They are painful to touch.  Patient is a diabetic with last A1c of 8.  He would like to have them debrided down his not able to do himself.  He also has right ankle pain as well.  He states that there is pain with range of motion of the ankle.  He might have some arthritis in the ankle joint.  He would like to know if he can get a steroid shot for this.  Objective:  There were no vitals filed for this visit. Podiatric Exam: Vascular: dorsalis pedis and posterior tibial pulses are palpable bilateral. Capillary return is immediate. Temperature gradient is WNL. Skin turgor WNL  Sensorium: Decreased Semmes Weinstein monofilament test.  Decreased tactile sensation bilaterally. Nail Exam: Pt has thick disfigured discolored nails with subungual debris noted bilateral entire nail hallux through fifth toenails.  Pain on palpation to the nails. Ulcer Exam: There is no evidence of ulcer or pre-ulcerative changes or infection. Orthopedic Exam: Muscle tone and strength are WNL. No limitations in general ROM. No crepitus or effusions noted. HAV  B/L.  Hammer toes 2-5  B/L.  Pain on palpation to the right ankle medial gutter.  Pain with range of motion of the ankle joint deep intra-articular pain noted. Skin: No Porokeratosis. No infection or ulcers    Assessment & Plan:   1. Capsulitis of ankle, right   2. Pain due to onychomycosis of toenails of both feet   3. Type 2 diabetes mellitus with diabetic polyneuropathy, with long-term current use of insulin ([place])     Patient was evaluated and  treated and all questions answered.  Right ankle joint capsulitis -I explained to the patient the etiology of capsulitis and various treatment options were discussed.  I believe patient will benefit from steroid injection help decrease acute inflammatory component associated pain.  Patient agrees with the plan like to proceed with a steroid shot. -A steroid injection was performed at right ankle joint using 1% plain Lidocaine and 10 mg of Kenalog. This was well tolerated.   Onychomycosis with pain  -Nails palliatively debrided as below. -Educated on self-care  Procedure: Nail Debridement Rationale: pain  Type of Debridement: manual, sharp debridement. Instrumentation: Nail nipper, rotary burr. Number of Nails: 10  Procedures and Treatment: Consent by patient was obtained for treatment procedures. The patient understood the discussion of treatment and procedures well. All questions were answered thoroughly reviewed. Debridement of mycotic and hypertrophic toenails, 1 through 5 bilateral and clearing of subungual debris. No ulceration, no infection noted.  Return Visit-Office Procedure: Patient instructed to return to the office for a follow up visit 3 months for continued evaluation and treatment.  Boneta Lucks, DPM    No follow-ups on file.

## 2021-05-06 ENCOUNTER — Other Ambulatory Visit: Payer: Self-pay

## 2021-05-06 ENCOUNTER — Ambulatory Visit (INDEPENDENT_AMBULATORY_CARE_PROVIDER_SITE_OTHER): Payer: No Typology Code available for payment source | Admitting: Podiatry

## 2021-05-06 DIAGNOSIS — Z794 Long term (current) use of insulin: Secondary | ICD-10-CM

## 2021-05-06 DIAGNOSIS — M7751 Other enthesopathy of right foot: Secondary | ICD-10-CM

## 2021-05-06 DIAGNOSIS — B351 Tinea unguium: Secondary | ICD-10-CM | POA: Diagnosis not present

## 2021-05-06 DIAGNOSIS — M79675 Pain in left toe(s): Secondary | ICD-10-CM | POA: Diagnosis not present

## 2021-05-06 DIAGNOSIS — E1142 Type 2 diabetes mellitus with diabetic polyneuropathy: Secondary | ICD-10-CM | POA: Diagnosis not present

## 2021-05-06 DIAGNOSIS — M79674 Pain in right toe(s): Secondary | ICD-10-CM

## 2021-05-12 ENCOUNTER — Encounter: Payer: Self-pay | Admitting: Podiatry

## 2021-05-12 NOTE — Progress Notes (Signed)
  Subjective:  Patient ID: Ricky Johnston, male    DOB: 1951-04-30,  MRN: [id number]  No chief complaint on file.  70 y.o. male returns for the above complaint.  Patient presents with thickened elongated dystrophic toenails x10.  They are painful to touch.  Patient is a diabetic with last A1c of 8.  He would like to have them debrided down his not able to do himself.  He states the injection has helped considerably in the ankle joint.  He would like to do another one is he still has some residual pain.  He denies any other acute complaints.  Objective:  There were no vitals filed for this visit. Podiatric Exam: Vascular: dorsalis pedis and posterior tibial pulses are palpable bilateral. Capillary return is immediate. Temperature gradient is WNL. Skin turgor WNL  Sensorium: Decreased Semmes Weinstein monofilament test.  Decreased tactile sensation bilaterally. Nail Exam: Pt has thick disfigured discolored nails with subungual debris noted bilateral entire nail hallux through fifth toenails.  Pain on palpation to the nails. Ulcer Exam: There is no evidence of ulcer or pre-ulcerative changes or infection. Orthopedic Exam: Muscle tone and strength are WNL. No limitations in general ROM. No crepitus or effusions noted. HAV  B/L.  Hammer toes 2-5  B/L.  Pain on palpation to the right ankle medial gutter.  Pain with range of motion of the ankle joint deep intra-articular pain noted. Skin: No Porokeratosis. No infection or ulcers    Assessment & Plan:   1. Capsulitis of ankle, right   2. Type 2 diabetes mellitus with diabetic polyneuropathy, with long-term current use of insulin ([facility])   3. Pain due to onychomycosis of toenails of both feet      Patient was evaluated and treated and all questions answered.  Right ankle joint capsulitis -I explained to the patient the etiology of capsulitis and various treatment options were discussed.  I believe patient will benefit from steroid injection help  decrease acute inflammatory component associated pain.  Patient agrees with the plan like to proceed with a steroid shot. -A second steroid injection was performed at right ankle joint using 1% plain Lidocaine and 10 mg of Kenalog. This was well tolerated.   Onychomycosis with pain  -Nails palliatively debrided as below. -Educated on self-care  Procedure: Nail Debridement Rationale: pain  Type of Debridement: manual, sharp debridement. Instrumentation: Nail nipper, rotary burr. Number of Nails: 10  Procedures and Treatment: Consent by patient was obtained for treatment procedures. The patient understood the discussion of treatment and procedures well. All questions were answered thoroughly reviewed. Debridement of mycotic and hypertrophic toenails, 1 through 5 bilateral and clearing of subungual debris. No ulceration, no infection noted.  Return Visit-Office Procedure: Patient instructed to return to the office for a follow up visit 3 months for continued evaluation and treatment.  Boneta Lucks, DPM    No follow-ups on file.

## 2021-06-30 ENCOUNTER — Other Ambulatory Visit: Payer: Self-pay

## 2021-06-30 ENCOUNTER — Encounter (HOSPITAL_COMMUNITY)
Admission: RE | Admit: 2021-06-30 | Discharge: 2021-06-30 | Disposition: A | Discharge status: Home / Self Care | Payer: No Typology Code available for payment source | Source: Ambulatory Visit | Attending: *Deleted | Admitting: *Deleted

## 2021-06-30 ENCOUNTER — Encounter (HOSPITAL_COMMUNITY): Payer: Self-pay

## 2021-06-30 VITALS — BP 126/70 | HR 71 | Ht 68.0 in | Wt 279.3 lb

## 2021-06-30 DIAGNOSIS — J449 Chronic obstructive pulmonary disease, unspecified: Secondary | ICD-10-CM | POA: Insufficient documentation

## 2021-06-30 LAB — GLUCOSE, CAPILLARY: Glucose-Capillary: 228 mg/dL — ABNORMAL HIGH (ref 70–99)

## 2021-06-30 NOTE — Progress Notes (Signed)
Pulmonary Individual Treatment Plan  Patient Details  Name: Ricky ODHAM Sr. MRN: [id number] Date of Birth: 11-22-1951 Referring Provider:   Flowsheet Row PULMONARY REHAB COPD ORIENTATION from 06/30/2021 in [facility]  Referring Provider Dr. Lula Olszewski       Initial Encounter Date:  Flowsheet Row PULMONARY REHAB COPD ORIENTATION from 06/30/2021 in [facility]  Date 06/30/21       Visit Diagnosis: Chronic obstructive pulmonary disease, unspecified COPD type ([place])  Patient's Home Medications on Admission:   Current Outpatient Medications:    empagliflozin (JARDIANCE) 25 MG TABS tablet, Take 25 mg by mouth daily., Disp: , Rfl:    rOPINIRole (REQUIP) 0.5 MG tablet, Take 1 tablet by mouth at bedtime., Disp: , Rfl:    rOPINIRole (REQUIP) 1 MG tablet, Take 1 tablet by mouth at bedtime., Disp: , Rfl:    SEMAGLUTIDE, 1 MG/DOSE, [place], Inject 1 mg into the skin once a week., Disp: , Rfl:    acetaminophen (TYLENOL) 325 MG tablet, Take 3 tablets (975 mg total) by mouth every 8 (eight) hours as needed for mild pain. (Patient taking differently: Take 975 mg by mouth 2 (two) times daily as needed for mild pain. ), Disp: , Rfl:    albuterol (PROVENTIL HFA;VENTOLIN HFA) 108 (90 Base) MCG/ACT inhaler, Inhale 2 puffs into the lungs every 6 (six) hours as needed for wheezing or shortness of breath., Disp: , Rfl:    ammonium lactate (LAC-HYDRIN) 12 % lotion, Apply 1 application topically daily as needed for dry skin., Disp: , Rfl:    aspirin 81 MG chewable tablet, Chew 1 tablet (81 mg total) by mouth 2 (two) times daily after a meal., Disp: 60 tablet, Rfl: 0   atorvastatin (LIPITOR) 80 MG tablet, Take 80 mg by mouth at bedtime., Disp: , Rfl:    cetirizine (ZYRTEC) 10 MG tablet, Take 10 mg by mouth daily as needed for allergies., Disp: , Rfl:    cholecalciferol (VITAMIN D) 1000 UNITS tablet, Take 1,000 Units by mouth daily. , Disp: , Rfl:    diclofenac Sodium  (VOLTAREN) 1 % GEL, Apply topically., Disp: , Rfl:    ferrous sulfate 325 (65 FE) MG tablet, Take 325 mg by mouth daily. , Disp: , Rfl:    fluticasone (FLONASE) 50 MCG/ACT nasal spray, , Disp: , Rfl:    Fluticasone-Salmeterol (ADVAIR) 250-50 MCG/DOSE AEPB, INHALE 1 PUFF BY ORAL INHALATION TWO TIMES A DAY **REPLACES SYMBICORT INHALER**, Disp: , Rfl:    furosemide (LASIX) 20 MG tablet, Take 40 mg by mouth daily. , Disp: , Rfl:    gabapentin (NEURONTIN) 300 MG capsule, Take 600 mg by mouth 2 (two) times daily. , Disp: , Rfl:    HUMULIN R U-500 KWIKPEN 500 UNIT/ML kwikpen, 3 (three) times daily with meals. 110 unit with breakfast 110 units with lunch 50-60 units with supper, Disp: , Rfl:    hydrocortisone 2.5 % lotion, Apply topically., Disp: , Rfl:    ketoconazole (NIZORAL) 2 % shampoo, Apply topically., Disp: , Rfl:    losartan (COZAAR) 25 MG tablet, Take 25 mg by mouth daily., Disp: , Rfl:    metFORMIN (GLUCOPHAGE) 1000 MG tablet, Take 1,000 mg by mouth 2 (two) times daily with a meal., Disp: , Rfl:    omeprazole (PRILOSEC) 20 MG capsule, Take 20 mg by mouth 2 (two) times daily before a meal., Disp: , Rfl:    OXYGEN, Inhale 1 L into the lungs continuous. 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   Complication of anesthesia    hard for him to wake up from Anesthesia from left nephrectomy   COPD (chronic obstructive pulmonary disease) ([place])    Diabetes mellitus  without complication ([place])    DVT (deep venous thrombosis) ([place]) 01/08/2014, 2010   upper extremity   GERD (gastroesophageal reflux disease)    Hallux limitus 05/18/2015   from notes from [facility]    Headache    migraines   History of kidney stones    Hypercholesterolemia    Hypertension    Impaired hearing    Intervertebral disc syndrome    Kidney stones    Pulmonary fibrosis ([place])    Renal calculi 01/08/2014   frrom noted from [facility] .in chart   Restless legs 01/08/2014   Sciatic leg pain    paralysis of sciatic nerve   Sleep apnea    CPAP/BIPAP   Tinnitus     Tobacco Use: Social History   Tobacco Use  Smoking Status Former   Packs/day: 1.00   Years: 33.00   Pack years: 33.00   Types: Cigarettes   Quit date: 04/06/2000   Years since quitting: 21.2  Smokeless Tobacco Never  Tobacco Comments   use to chew cigars    Labs: Recent Review Flowsheet Data     Labs for ITP Cardiac and Pulmonary Rehab Latest Ref Rng & Units 01/08/2015 02/10/2017 02/10/2017 02/11/2017   Hemoglobin A1c 4.8 - 5.6 % 8.1(H) - - -   PHART 7.350 - 7.450 - 7.181(LL) 7.217(L) 7.298(L)   PCO2ART 32.0 - 48.0 mmHg - 82.8(HH) 73.1(HH) 62.5(H)   HCO3 20.0 - 28.0 mmol/L - 29.8(H) 28.6(H) 29.4(H)   ACIDBASEDEF 0.0 - 2.0 mmol/L - 0.7 0.8 -   O2SAT % - 98.5 93.5 96.1       Capillary Blood Glucose: Lab Results  Component Value Date   GLUCAP 228 (H) 06/30/2021   GLUCAP 181 (H) 09/07/2018   GLUCAP 91 08/24/2018   GLUCAP 119 (H) 08/24/2018   GLUCAP 339 (H) 08/22/2018     Pulmonary Assessment Scores:  Pulmonary Assessment Scores     Row Name 06/30/21 1426         ADL UCSD   ADL Phase Entry     SOB Score total 30     Rest 2     Walk 3     Stairs 5     Bath 3     Dress 3     Shop 0           CAT Score     CAT Score 26           mMRC Score     mMRC Score 4            UCSD: Self-administered rating of dyspnea associated with activities of daily living (ADLs) 6-point scale  (0 = "  not at all" to 5 = "maximal or unable to do because of breathlessness")  Scoring Scores range from 0 to 120.  Minimally important difference is 5 units  CAT: CAT can identify the health impairment of COPD patients and is better correlated with disease progression.  CAT has a scoring range of zero to 40. The CAT score is classified into four groups of low (less than 10), medium (10 - 20), high (21-30) and very high (31-40) based on the impact level of disease on health status. A CAT score over 10 suggests significant symptoms.  A worsening CAT score could be explained by an exacerbation, poor medication adherence, poor inhaler technique, or progression of COPD or comorbid conditions.  CAT MCID is 2 points  mMRC: mMRC (Modified Medical Research Council) Dyspnea Scale is used to assess the degree of baseline functional disability in patients of respiratory disease due to dyspnea. No minimal important difference is established. A decrease in score of 1 point or greater is considered a positive change.   Pulmonary Function Assessment:   Exercise Target Goals: Exercise Program Goal: Individual exercise prescription set using results from initial 6 min walk test and THRR while considering  patient's activity barriers and safety.   Exercise Prescription Goal: Initial exercise prescription builds to 30-45 minutes a day of aerobic activity, 2-3 days per week.  Home exercise guidelines will be given to patient during program as part of exercise prescription that the participant will acknowledge.  Activity Barriers & Risk Stratification:  Activity Barriers & Cardiac Risk Stratification - 06/30/21 1307       Activity Barriers & Cardiac Risk Stratification   Activity Barriers Arthritis;Back Problems;Left Hip Replacement;Joint Problems;Deconditioning;Shortness of Breath;History of Falls;Assistive Device;Balance Concerns    Cardiac Risk Stratification Low             6 Minute Walk:  6  Minute Walk     Row Name 06/30/21 1435         6 Minute Walk   Phase Initial     Distance 550 feet     Walk Time 6 minutes     # of Rest Breaks 2     MPH 1     METS 0.96     RPE 13     Perceived Dyspnea  15     VO2 Peak 3.35     Symptoms Yes (comment)     Comments used rollator. Two standing breaks due to SOB     Resting HR 71 bpm     Resting BP 126/70     Resting Oxygen Saturation  96 %     Exercise Oxygen Saturation  during 6 min walk 93 %     Max Ex. HR 106 bpm     Max Ex. BP 154/60     2 Minute Post BP 126/68           Interval HR     1 Minute HR 92     2 Minute HR 100     3 Minute HR 96     4 Minute HR 106     5 Minute HR 99     6 Minute HR 98     2 Minute Post HR 82     Interval Heart Rate? Yes           Interval Oxygen     Interval Oxygen? Yes     Baseline Oxygen Saturation % 96 %     1 Minute Oxygen  Saturation % 96 %     1 Minute Liters of Oxygen 4 L     2 Minute Oxygen Saturation % 95 %     2 Minute Liters of Oxygen 4 L     3 Minute Oxygen Saturation % 93 %     3 Minute Liters of Oxygen 4 L     4 Minute Oxygen Saturation % 95 %     4 Minute Liters of Oxygen 4 L     5 Minute Oxygen Saturation % 94 %     5 Minute Liters of Oxygen 4 L     6 Minute Oxygen Saturation % 94 %     6 Minute Liters of Oxygen 4 L     2 Minute Post Oxygen Saturation % 97 %     2 Minute Post Liters of Oxygen 4 L             Oxygen Initial Assessment:  Oxygen Initial Assessment - 06/30/21 1308       Home Oxygen   Home Oxygen Device Home Concentrator;E-Tanks    Sleep Oxygen Prescription BiPAP    Liters per minute 3    Home Resting Oxygen Prescription Continuous    Liters per minute 3      Initial 6 min Walk   Oxygen Used Continuous    Liters per minute 4      Intervention   Short Term Goals To learn and exhibit compliance with exercise, home and travel O2 prescription;To learn and understand importance of monitoring SPO2 with pulse oximeter and demonstrate  accurate use of the pulse oximeter.;To learn and understand importance of maintaining oxygen saturations>88%;To learn and demonstrate proper pursed lip breathing techniques or other breathing techniques.     Long  Term Goals Exhibits compliance with exercise, home  and travel O2 prescription;Verbalizes importance of monitoring SPO2 with pulse oximeter and return demonstration;Maintenance of O2 saturations>88%;Exhibits proper breathing techniques, such as pursed lip breathing or other method taught during program session             Oxygen Re-Evaluation:   Oxygen Discharge (Final Oxygen Re-Evaluation):   Initial Exercise Prescription:  Initial Exercise Prescription - 06/30/21 1400       Date of Initial Exercise RX and Referring Provider   Date 06/30/21    Referring Provider Dr. Lula Olszewski    Expected Discharge Date 10/28/21      Oxygen   Oxygen Continuous    Liters 4      NuStep   Level 1    SPM 60    Minutes 39      Prescription Details   Frequency (times per week) 2    Duration Progress to 30 minutes of continuous aerobic without signs/symptoms of physical distress      Intensity   THRR 40-80% of Max Heartrate 60-120    Ratings of Perceived Exertion 11-13    Perceived Dyspnea 0-4      Resistance Training   Training Prescription Yes    Weight 3 lbs    Reps 10-15             Perform Capillary Blood Glucose checks as needed.  Exercise Prescription Changes:   Exercise Comments:   Exercise Goals and Review:   Exercise Goals     Row Name 06/30/21 1438             Exercise Goals   Increase Physical Activity Yes       Intervention Provide advice, education,  support and counseling about physical activity/exercise needs.;Develop an individualized exercise prescription for aerobic and resistive training based on initial evaluation findings, risk stratification, comorbidities and participant's personal goals.       Expected Outcomes Short Term: Attend  rehab on a regular basis to increase amount of physical activity.;Long Term: Add in home exercise to make exercise part of routine and to increase amount of physical activity.;Long Term: Exercising regularly at least 3-5 days a week.       Increase Strength and Stamina Yes       Intervention Provide advice, education, support and counseling about physical activity/exercise needs.;Develop an individualized exercise prescription for aerobic and resistive training based on initial evaluation findings, risk stratification, comorbidities and participant's personal goals.       Expected Outcomes Short Term: Increase workloads from initial exercise prescription for resistance, speed, and METs.;Short Term: Perform resistance training exercises routinely during rehab and add in resistance training at home;Long Term: Improve cardiorespiratory fitness, muscular endurance and strength as measured by increased METs and functional capacity (6MWT)       Able to understand and use rate of perceived exertion (RPE) scale Yes       Intervention Provide education and explanation on how to use RPE scale       Expected Outcomes Short Term: Able to use RPE daily in rehab to express subjective intensity level;Long Term:  Able to use RPE to guide intensity level when exercising independently       Able to understand and use Dyspnea scale Yes       Intervention Provide education and explanation on how to use Dyspnea scale       Expected Outcomes Short Term: Able to use Dyspnea scale daily in rehab to express subjective sense of shortness of breath during exertion;Long Term: Able to use Dyspnea scale to guide intensity level when exercising independently       Knowledge and understanding of Target Heart Rate Range (THRR) Yes       Intervention Provide education and explanation of THRR including how the numbers were predicted and where they are located for reference       Expected Outcomes Short Term: Able to state/look up  THRR;Short Term: Able to use daily as guideline for intensity in rehab;Long Term: Able to use THRR to govern intensity when exercising independently       Understanding of Exercise Prescription Yes       Intervention Provide education, explanation, and written materials on patient's individual exercise prescription       Expected Outcomes Short Term: Able to explain program exercise prescription;Long Term: Able to explain home exercise prescription to exercise independently                Exercise Goals Re-Evaluation :   Discharge Exercise Prescription (Final Exercise Prescription Changes):   Nutrition:  Target Goals: Understanding of nutrition guidelines, daily intake of sodium '1500mg'$ , cholesterol '200mg'$ , calories 30% from fat and 7% or less from saturated fats, daily to have 5 or more servings of fruits and vegetables.  Biometrics:  Pre Biometrics - 06/30/21 1439       Pre Biometrics   Height '5\' 8"'$  (1.727 m)    Weight 126.7 kg    Waist Circumference 55 inches    Hip Circumference 51 inches    Waist to Hip Ratio 1.08 %    BMI (Calculated) 42.48    Triceps Skinfold 7 mm    % Body Fat 37.3 %  Grip Strength 28 kg    Flexibility 0 in    Single Leg Stand 0 seconds              Nutrition Therapy Plan and Nutrition Goals:  Nutrition Therapy & Goals - 06/30/21 1410       Personal Nutrition Goals   Comments Patient scored 127 on his diet assessment. Score discussed with patient and his wife. They both state that he is not interested in changing his diet. Handout provided regarding DM control and making healthier choices. We provide 2 educational sessions regarding heart heathy nutrition with handouts and assistance with RD referral.      Intervention Plan   Intervention Nutrition handout(s) given to patient.             Nutrition Assessments:  Nutrition Assessments - 06/30/21 1414       MEDFICTS Scores   Pre Score 127            MEDIFICTS Score  Key: ?70 Need to make dietary changes  40-70 Heart Healthy Diet ? 40 Therapeutic Level Cholesterol Diet   Picture Your Plate Scores: [id number] Unhealthy dietary pattern with much room for improvement. 41-50 Dietary pattern unlikely to meet recommendations for good health and room for improvement. 51-60 More healthful dietary pattern, with some room for improvement.  >60 Healthy dietary pattern, although there may be some specific behaviors that could be improved.    Nutrition Goals Re-Evaluation:   Nutrition Goals Discharge (Final Nutrition Goals Re-Evaluation):   Psychosocial: Target Goals: Acknowledge presence or absence of significant depression and/or stress, maximize coping skills, provide positive support system. Participant is able to verbalize types and ability to use techniques and skills needed for reducing stress and depression.  Initial Review & Psychosocial Screening:  Initial Psych Review & Screening - 06/30/21 1448       Initial Review   Current issues with None Identified      Family Dynamics   Good Support System? Yes      Barriers   Psychosocial barriers to participate in program Psychosocial barriers identified (see note)      Screening Interventions   Interventions Encouraged to exercise;Program counselor consult;Provide feedback about the scores to participant;To provide support and resources with identified psychosocial needs    Expected Outcomes Short Term goal: Utilizing psychosocial counselor, staff and physician to assist with identification of specific Stressors or current issues interfering with healing process. Setting desired goal for each stressor or current issue identified.;Long Term Goal: Stressors or current issues are controlled or eliminated.;Short Term goal: Identification and review with participant of any Quality of Life or Depression concerns found by scoring the questionnaire.             Quality of Life Scores:  Quality of Life -  06/30/21 1434       Quality of Life   Select Quality of Life      Quality of Life Scores   Health/Function Pre 12.66 %    Socioeconomic Pre 11.8 %    Psych/Spiritual Pre 11.79 %    Family Pre 12.4 %    GLOBAL Pre 12.3 %            Scores of 19 and below usually indicate a poorer quality of life in these areas.  A difference of  2-3 points is a clinically meaningful difference.  A difference of 2-3 points in the total score of the Quality of Life Index has been associated with significant improvement in  overall quality of life, self-image, physical symptoms, and general health in studies assessing change in quality of life.   PHQ-9: Recent Review Flowsheet Data     Depression screen [facility] 2/9 06/30/2021 12/31/2018 09/28/2018 08/07/2018   Decreased Interest 0 0 0 1   Down, Depressed, Hopeless 0 0 0 2   PHQ - 2 Score 0 0 0 3   Altered sleeping 0 0 0 2   Tired, decreased energy 0 '1 1 2   '$ Change in appetite 1 0 0 0   Feeling bad or failure about yourself  0 0 0 0   Trouble concentrating 0 0 0 0   Moving slowly or fidgety/restless 0 0 0 0   Suicidal thoughts 0 0 0 0   PHQ-9 Score '1 1 1 7   '$ Difficult doing work/chores Not difficult at all Not difficult at all Not difficult at all Very difficult      Interpretation of Total Score  Total Score Depression Severity:  1-4 = Minimal depression, 5-9 = Mild depression, 10-14 = Moderate depression, 15-19 = Moderately severe depression, 20-27 = Severe depression   Psychosocial Evaluation and Intervention:  Psychosocial Evaluation - 06/30/21 1449       Psychosocial Evaluation & Interventions   Interventions Encouraged to exercise with the program and follow exercise prescription;Stress management education;Relaxation education    Comments Patient has no psychosocial barriers identified to participate in PR. His initial PHQ-9 score was 1 and his QOL score 12.3% overall scoreing 11 and 12% in all categories. He denies any current or history of  depression, anxiety, or stress. His wife is present with him today whom he says is his emotional support person. They both have children of their own but none together. He has 3 sons that all live out-of-state and do not contact him often. She has 2 daughters that live near and he sees and talks to them often. He has polyarthritis which limits his mobility along with COPD that prevents him from doing activities. His long term goal is to get off of oxygen. For the program, he would like for his SOB to improve and to get stronger to be able to do more activities. He scored 127 on his diet assessment and stated he was not interested in changing his diet and that he "loves to eat". Difficult to measure his motivation to improve his health overall but he did demonstate an interest in participating in the program to hopefully meet his goals.    Expected Outcomes Patient will continue to have no psychosocial barriers identififed.    Continue Psychosocial Services  No Follow up required             Psychosocial Re-Evaluation:   Psychosocial Discharge (Final Psychosocial Re-Evaluation):    Education: Education Goals: Education classes will be provided on a weekly basis, covering required topics. Participant will state understanding/return demonstration of topics presented.  Learning Barriers/Preferences:  Learning Barriers/Preferences - 06/30/21 1414       Learning Barriers/Preferences   Learning Barriers Hearing    Learning Preferences Individual Instruction;Skilled Demonstration             Education Topics: How Lungs Work and Diseases: - Discuss the anatomy of the lungs and diseases that can affect the lungs, such as COPD.   Exercise: -Discuss the importance of exercise, FITT principles of exercise, normal and abnormal responses to exercise, and how to exercise safely.   Environmental Irritants: -Discuss types of environmental irritants and how to limit  exposure to environmental  irritants.   Meds/Inhalers and oxygen: - Discuss respiratory medications, definition of an inhaler and oxygen, and the proper way to use an inhaler and oxygen.   Energy Saving Techniques: - Discuss methods to conserve energy and decrease shortness of breath when performing activities of daily living.    Bronchial Hygiene / Breathing Techniques: - Discuss breathing mechanics, pursed-lip breathing technique,  proper posture, effective ways to clear airways, and other functional breathing techniques   Cleaning Equipment: - Provides group verbal and written instruction about the health risks of elevated stress, cause of high stress, and healthy ways to reduce stress.   Nutrition I: Fats: - Discuss the types of cholesterol, what cholesterol does to the body, and how cholesterol levels can be controlled.   Nutrition II: Labels: -Discuss the different components of food labels and how to read food labels.   Respiratory Infections: - Discuss the signs and symptoms of respiratory infections, ways to prevent respiratory infections, and the importance of seeking medical treatment when having a respiratory infection.   Stress I: Signs and Symptoms: - Discuss the causes of stress, how stress may lead to anxiety and depression, and ways to limit stress.   Stress II: Relaxation: -Discuss relaxation techniques to limit stress.   Oxygen for Home/Travel: - Discuss how to prepare for travel when on oxygen and proper ways to transport and store oxygen to ensure safety.   Knowledge Questionnaire Score:  Knowledge Questionnaire Score - 06/30/21 1448       Knowledge Questionnaire Score   Pre Score 15/18             Core Components/Risk Factors/Patient Goals at Admission:  Personal Goals and Risk Factors at Admission - 06/30/21 1416       Core Components/Risk Factors/Patient Goals on Admission    Weight Management Obesity    Improve shortness of breath with ADL's Yes     Intervention Provide education, individualized exercise plan and daily activity instruction to help decrease symptoms of SOB with activities of daily living.    Expected Outcomes Short Term: Improve cardiorespiratory fitness to achieve a reduction of symptoms when performing ADLs;Long Term: Be able to perform more ADLs without symptoms or delay the onset of symptoms    Diabetes Yes    Intervention Provide education about signs/symptoms and action to take for hypo/hyperglycemia.;Provide education about proper nutrition, including hydration, and aerobic/resistive exercise prescription along with prescribed medications to achieve blood glucose in normal ranges: Fasting glucose 65-99 mg/dL    Expected Outcomes Short Term: Participant verbalizes understanding of the signs/symptoms and immediate care of hyper/hypoglycemia, proper foot care and importance of medication, aerobic/resistive exercise and nutrition plan for blood glucose control.    Personal Goal Other Yes    Personal Goal Improve SOB. Get stronger and strenghten lungs to be able to do more activities.    Intervention Patient will attend PR 2 days/week and supplement with exercise 3 days/week.    Expected Outcomes Patient will complete the program and meet both personal and program.             Core Components/Risk Factors/Patient Goals Review:    Core Components/Risk Factors/Patient Goals at Discharge (Final Review):    ITP Comments:   Comments: Patient arrived for 1st visit/orientation/education at 1230. Patient was referred to PR by the [facility], Dr. Suzie Portela due to COPD. During orientation advised patient on arrival and appointment times what to wear, what to do before, during and after exercise. Reviewed attendance  and class policy.  Pt is scheduled to return Pulmonary Rehab on 07/06/21 at 1330. Pt was advised to come to class 15 minutes before class starts.  Discussed RPE/Dpysnea scales. Patient participated in warm up stretches.  Patient was able to complete 6 minute walk test. Patient was measured for the equipment. Discussed equipment safety with patient. Took patient pre-anthropometric measurements. Patient finished visit at 1430.

## 2021-06-30 NOTE — Progress Notes (Signed)
Cardiac/Pulmonary Rehab Medication Review by a Pharmacist  Does the patient  feel that his/her medications are working for him/her?  yes  Has the patient been experiencing any side effects to the medications prescribed?  yes  Does the patient measure his/her own blood pressure or blood glucose at home?  yes   Does the patient have any problems obtaining medications due to transportation or finances?   no  Understanding of regimen: good Understanding of indications: good Potential of compliance: excellent  Questions asked to Determine Patient Understanding of Medication Regimen:  1. What is the name of the medication?  2. What is the medication used for?  3. When should it be taken?  4. How much should be taken?  5. How will you take it?  6. What side effects should you report?  Understanding Defined as: Excellent: All questions above are correct Good: Questions 1-4 are correct Fair: Questions 1-2 are correct  Poor: 1 or none of the above questions are correct   Pharmacist comments: Patient presents today for pulmonary rehab. Has difficulty breathing. We reviewed his medications. Patient thinks he takes too many medications. We discussed options for optimizing treatment by starting with some exercise. He currently does not do any exercise. He is tolerating his current regimen. He monitors his BS 3 times per day and monitors his BP.  He is followed by the [facility] and has no issues with obtaining his medications. Continue with current regimen in hopes that insulin reduction will occur with weight loss.   Thanks for the opportunity to participate in the care of this patient,  Isac Sarna, BS [place], [place] Pharmacist Pager [phone] 06/30/2021 1:52 PM

## 2021-07-06 ENCOUNTER — Encounter (HOSPITAL_COMMUNITY)
Admission: RE | Admit: 2021-07-06 | Discharge: 2021-07-06 | Disposition: A | Discharge status: Home / Self Care | Payer: No Typology Code available for payment source | Source: Ambulatory Visit | Attending: *Deleted | Admitting: *Deleted

## 2021-07-06 ENCOUNTER — Other Ambulatory Visit: Payer: Self-pay

## 2021-07-06 DIAGNOSIS — J449 Chronic obstructive pulmonary disease, unspecified: Secondary | ICD-10-CM

## 2021-07-06 NOTE — Progress Notes (Signed)
Daily Session Note  Patient Details  Name: Ricky GONDEK Sr. MRN: [id number] Date of Birth: 1951-04-26 Referring Provider:   Flowsheet Row PULMONARY REHAB COPD ORIENTATION from 06/30/2021 in [place]  Referring Provider Dr. Lula Olszewski       Encounter Date: 07/06/2021  Check In:  Session Check In - 07/06/21 1330       Check-In   Supervising physician immediately available to respond to emergencies [facility] MD immediately available    Physician(s) Dr. Johnsie Cancel    Medication changes reported     No    Fall or balance concerns reported    Yes    Comments using rollator for balance    Resistance Training Performed Yes    VAD Patient? No      Pain Assessment   Currently in Pain? Yes    Pain Score 6     Pain Location Hip    Pain Orientation Right    Pain Descriptors / Indicators Constant    Pain Type Chronic pain    Pain Onset More than a month ago    Pain Frequency Constant    Pain Relieving Factors took some Tyneol this AM 2tab 323m    Multiple Pain Sites No             Capillary Blood Glucose: No results found for this or any previous visit (from the past 24 hour(s)).    Social History   Tobacco Use  Smoking Status Former   Packs/day: 1.00   Years: 33.00   Pack years: 33.00   Types: Cigarettes   Quit date: 04/06/2000   Years since quitting: 21.2  Smokeless Tobacco Never  Tobacco Comments   use to chew cigars    Goals Met:  Proper associated with RPD/PD & O2 Sat Independence with exercise equipment Exercise tolerated well No report of cardiac concerns or symptoms Strength training completed today  Goals Unmet:  Not Applicable  Comments: check out '@2' :30pm   Dr. JKathie Dikeis Medical Director [facility].

## 2021-07-08 ENCOUNTER — Other Ambulatory Visit: Payer: Self-pay

## 2021-07-08 ENCOUNTER — Encounter (HOSPITAL_COMMUNITY)
Admission: RE | Admit: 2021-07-08 | Discharge: 2021-07-08 | Disposition: A | Discharge status: Home / Self Care | Payer: No Typology Code available for payment source | Source: Ambulatory Visit | Attending: *Deleted | Admitting: *Deleted

## 2021-07-08 DIAGNOSIS — J449 Chronic obstructive pulmonary disease, unspecified: Secondary | ICD-10-CM | POA: Diagnosis not present

## 2021-07-08 NOTE — Progress Notes (Signed)
Daily Session Note  Patient Details  Name: Ricky HUPP Sr. MRN: [id number] Date of Birth: 1950/12/03 Referring Provider:   Flowsheet Row PULMONARY REHAB COPD ORIENTATION from 06/30/2021 in [place]  Referring Provider Dr. Lula Olszewski       Encounter Date: 07/08/2021  Check In:  Session Check In - 07/08/21 1330       Check-In   Supervising physician immediately available to respond to emergencies [facility] MD immediately available    Physician(s) Dr. Harrington Challenger    Location [facility]    Staff Present Hoy Register, MS, ACSM-CEP, Exercise Physiologist    Virtual Visit No    Medication changes reported     No    Fall or balance concerns reported    Yes    Tobacco Cessation No Change    Warm-up and Cool-down Performed as group-led instruction    Resistance Training Performed Yes    VAD Patient? No    PAD/SET Patient? No      Pain Assessment   Currently in Pain? Yes    Pain Score 6     Pain Location Hip    Pain Orientation Right    Pain Descriptors / Indicators Constant    Pain Type Chronic pain    Pain Onset More than a month ago    Pain Frequency Constant    Pain Relieving Factors Tylenol 325 mg tabs    Multiple Pain Sites No             Capillary Blood Glucose: No results found for this or any previous visit (from the past 24 hour(s)).    Social History   Tobacco Use  Smoking Status Former   Packs/day: 1.00   Years: 33.00   Pack years: 33.00   Types: Cigarettes   Quit date: 04/06/2000   Years since quitting: 21.2  Smokeless Tobacco Never  Tobacco Comments   use to chew cigars    Goals Met:  Independence with exercise equipment Exercise tolerated well No report of cardiac concerns or symptoms Strength training completed today  Goals Unmet:  Not Applicable  Comments: checkout time is 1430   Dr. Kathie Dike is Medical Director for [facility].

## 2021-07-13 ENCOUNTER — Other Ambulatory Visit: Payer: Self-pay

## 2021-07-13 ENCOUNTER — Encounter (HOSPITAL_COMMUNITY)
Admission: RE | Admit: 2021-07-13 | Discharge: 2021-07-13 | Disposition: A | Discharge status: Home / Self Care | Payer: No Typology Code available for payment source | Source: Ambulatory Visit | Attending: *Deleted | Admitting: *Deleted

## 2021-07-13 VITALS — Wt 281.5 lb

## 2021-07-13 DIAGNOSIS — J449 Chronic obstructive pulmonary disease, unspecified: Secondary | ICD-10-CM | POA: Diagnosis not present

## 2021-07-13 NOTE — Progress Notes (Signed)
Daily Session Note  Patient Details  Name: Ricky MATSUMOTO Sr. MRN: [id number] Date of Birth: 1951/03/18 Referring Provider:   Flowsheet Row PULMONARY REHAB COPD ORIENTATION from 06/30/2021 in [place]  Referring Provider Dr. Lula Olszewski       Encounter Date: 07/13/2021  Check In:  Session Check In - 07/13/21 1330       Check-In   Supervising physician immediately available to respond to emergencies [facility] MD immediately available    Physician(s) Dr. Domenic Polite    Location [facility]    Staff Present Geanie Cooley, RN;Dalton Kris Mouton, MS, ACSM-CEP, Exercise Physiologist    Virtual Visit No    Medication changes reported     No    Fall or balance concerns reported    Yes    Comments using rollator for balance    Tobacco Cessation No Change    Warm-up and Cool-down Performed as group-led instruction    Resistance Training Performed Yes    VAD Patient? No    PAD/SET Patient? No      Pain Assessment   Pain Score 6     Pain Location Hip    Pain Orientation Right    Pain Descriptors / Indicators Constant    Pain Type Chronic pain    Pain Onset More than a month ago    Pain Frequency Constant    Pain Relieving Factors Tylenol 32m tabs prn             Capillary Blood Glucose: No results found for this or any previous visit (from the past 24 hour(s)).    Social History   Tobacco Use  Smoking Status Former   Packs/day: 1.00   Years: 33.00   Pack years: 33.00   Types: Cigarettes   Quit date: 04/06/2000   Years since quitting: 21.2  Smokeless Tobacco Never  Tobacco Comments   use to chew cigars    Goals Met:  Proper associated with RPD/PD & O2 Sat Independence with exercise equipment Exercise tolerated well No report of cardiac concerns or symptoms Strength training completed today  Goals Unmet:  Not Applicable  Comments: check out @ 2:30pm   Dr. JKathie Dikeis Medical Director [facility].

## 2021-07-14 NOTE — Progress Notes (Signed)
Pulmonary Individual Treatment Plan  Patient Details  Name: MAXIMILIANO CROMARTIE Sr. MRN: [id number] Date of Birth: 12/13/1950 Referring Provider:   Flowsheet Row PULMONARY REHAB COPD ORIENTATION from 06/30/2021 in [facility]  Referring Provider Dr. Lula Olszewski       Initial Encounter Date:  Flowsheet Row PULMONARY REHAB COPD ORIENTATION from 06/30/2021 in [facility]  Date 06/30/21       Visit Diagnosis: Chronic obstructive pulmonary disease, unspecified COPD type ([place])  Patient's Home Medications on Admission:   Current Outpatient Medications:    acetaminophen (TYLENOL) 325 MG tablet, Take 3 tablets (975 mg total) by mouth every 8 (eight) hours as needed for mild pain. (Patient taking differently: Take 975 mg by mouth 2 (two) times daily as needed for mild pain. ), Disp: , Rfl:    albuterol (PROVENTIL HFA;VENTOLIN HFA) 108 (90 Base) MCG/ACT inhaler, Inhale 2 puffs into the lungs every 6 (six) hours as needed for wheezing or shortness of breath., Disp: , Rfl:    ammonium lactate (LAC-HYDRIN) 12 % lotion, Apply 1 application topically daily as needed for dry skin., Disp: , Rfl:    aspirin 81 MG chewable tablet, Chew 1 tablet (81 mg total) by mouth 2 (two) times daily after a meal., Disp: 60 tablet, Rfl: 0   atorvastatin (LIPITOR) 80 MG tablet, Take 80 mg by mouth at bedtime., Disp: , Rfl:    cetirizine (ZYRTEC) 10 MG tablet, Take 10 mg by mouth daily as needed for allergies., Disp: , Rfl:    cholecalciferol (VITAMIN D) 1000 UNITS tablet, Take 1,000 Units by mouth daily. , Disp: , Rfl:    diclofenac Sodium (VOLTAREN) 1 % GEL, Apply topically., Disp: , Rfl:    empagliflozin (JARDIANCE) 25 MG TABS tablet, Take 25 mg by mouth daily., Disp: , Rfl:    ferrous sulfate 325 (65 FE) MG tablet, Take 325 mg by mouth daily. , Disp: , Rfl:    fluticasone (FLONASE) 50 MCG/ACT nasal spray, , Disp: , Rfl:    Fluticasone-Salmeterol (ADVAIR) 250-50 MCG/DOSE AEPB,  INHALE 1 PUFF BY ORAL INHALATION TWO TIMES A DAY **REPLACES SYMBICORT INHALER**, Disp: , Rfl:    furosemide (LASIX) 20 MG tablet, Take 40 mg by mouth daily. , Disp: , Rfl:    gabapentin (NEURONTIN) 300 MG capsule, Take 600 mg by mouth 2 (two) times daily. , Disp: , Rfl:    HUMULIN R U-500 KWIKPEN 500 UNIT/ML kwikpen, 3 (three) times daily with meals. 110 unit with breakfast 110 units with lunch 50-60 units with supper, Disp: , Rfl:    hydrocortisone 2.5 % lotion, Apply topically., Disp: , Rfl:    ketoconazole (NIZORAL) 2 % shampoo, Apply topically., Disp: , Rfl:    losartan (COZAAR) 25 MG tablet, Take 25 mg by mouth daily., Disp: , Rfl:    metFORMIN (GLUCOPHAGE) 1000 MG tablet, Take 1,000 mg by mouth 2 (two) times daily with a meal., Disp: , Rfl:    omeprazole (PRILOSEC) 20 MG capsule, Take 20 mg by mouth 2 (two) times daily before a meal., Disp: , Rfl:    OXYGEN, Inhale 1 L into the lungs continuous. At night time may increase to 2 L as needed for shortness of breath, Disp: , Rfl:    rOPINIRole (REQUIP) 0.5 MG tablet, Take 1 tablet by mouth at bedtime., Disp: , Rfl:    rOPINIRole (REQUIP) 1 MG tablet, Take 1 tablet by mouth at bedtime., Disp: , Rfl:    selenium sulfide (SELSUN) 2.5 %  shampoo, Apply topically., Disp: , Rfl:    SEMAGLUTIDE, 1 MG/DOSE, [place], Inject 1 mg into the skin once a week., Disp: , Rfl:    sodium chloride (ALTAMIST SPRAY) 0.65 % nasal spray, Place into the nose., Disp: , Rfl:    tamsulosin (FLOMAX) 0.4 MG CAPS capsule, Take 0.4 mg by mouth daily., Disp: , Rfl:    tiotropium (SPIRIVA HANDIHALER) 18 MCG inhalation capsule, Place 18 mcg into inhaler and inhale daily., Disp: , Rfl:   Past Medical History: Past Medical History:  Diagnosis Date   Arthritis    Cancer ([place])    bladder cancer currently [id number]   Complication of anesthesia    hard for him to wake up from Anesthesia from left nephrectomy   COPD (chronic obstructive pulmonary disease) ([place])    Diabetes mellitus  without complication ([place])    DVT (deep venous thrombosis) ([place]) 01/08/2014, 2010   upper extremity   GERD (gastroesophageal reflux disease)    Hallux limitus 05/18/2015   from notes from [facility]    Headache    migraines   History of kidney stones    Hypercholesterolemia    Hypertension    Impaired hearing    Intervertebral disc syndrome    Kidney stones    Pulmonary fibrosis ([place])    Renal calculi 01/08/2014   frrom noted from [facility] .in chart   Restless legs 01/08/2014   Sciatic leg pain    paralysis of sciatic nerve   Sleep apnea    CPAP/BIPAP   Tinnitus     Tobacco Use: Social History   Tobacco Use  Smoking Status Former   Packs/day: 1.00   Years: 33.00   Pack years: 33.00   Types: Cigarettes   Quit date: 04/06/2000   Years since quitting: 21.2  Smokeless Tobacco Never  Tobacco Comments   use to chew cigars    Labs: Recent Review Flowsheet Data     Labs for ITP Cardiac and Pulmonary Rehab Latest Ref Rng & Units 01/08/2015 02/10/2017 02/10/2017 02/11/2017   Hemoglobin A1c 4.8 - 5.6 % 8.1(H) - - -   PHART 7.350 - 7.450 - 7.181(LL) 7.217(L) 7.298(L)   PCO2ART 32.0 - 48.0 mmHg - 82.8(HH) 73.1(HH) 62.5(H)   HCO3 20.0 - 28.0 mmol/L - 29.8(H) 28.6(H) 29.4(H)   ACIDBASEDEF 0.0 - 2.0 mmol/L - 0.7 0.8 -   O2SAT % - 98.5 93.5 96.1       Capillary Blood Glucose: Lab Results  Component Value Date   GLUCAP 228 (H) 06/30/2021   GLUCAP 181 (H) 09/07/2018   GLUCAP 91 08/24/2018   GLUCAP 119 (H) 08/24/2018   GLUCAP 339 (H) 08/22/2018     Pulmonary Assessment Scores:  Pulmonary Assessment Scores     Row Name 06/30/21 1426         ADL UCSD   ADL Phase Entry     SOB Score total 30     Rest 2     Walk 3     Stairs 5     Bath 3     Dress 3     Shop 0           CAT Score   CAT Score 26           mMRC Score   mMRC Score 4             UCSD: Self-administered rating of dyspnea associated with activities of daily living (ADLs) 6-point scale (0  = "not at all"  to 5 = "maximal or unable to do because of breathlessness")  Scoring Scores range from 0 to 120.  Minimally important difference is 5 units  CAT: CAT can identify the health impairment of COPD patients and is better correlated with disease progression.  CAT has a scoring range of zero to 40. The CAT score is classified into four groups of low (less than 10), medium (10 - 20), high (21-30) and very high (31-40) based on the impact level of disease on health status. A CAT score over 10 suggests significant symptoms.  A worsening CAT score could be explained by an exacerbation, poor medication adherence, poor inhaler technique, or progression of COPD or comorbid conditions.  CAT MCID is 2 points  mMRC: mMRC (Modified Medical Research Council) Dyspnea Scale is used to assess the degree of baseline functional disability in patients of respiratory disease due to dyspnea. No minimal important difference is established. A decrease in score of 1 point or greater is considered a positive change.   Pulmonary Function Assessment:   Exercise Target Goals: Exercise Program Goal: Individual exercise prescription set using results from initial 6 min walk test and THRR while considering  patient's activity barriers and safety.   Exercise Prescription Goal: Initial exercise prescription builds to 30-45 minutes a day of aerobic activity, 2-3 days per week.  Home exercise guidelines will be given to patient during program as part of exercise prescription that the participant will acknowledge.  Activity Barriers & Risk Stratification:  Activity Barriers & Cardiac Risk Stratification - 06/30/21 1307       Activity Barriers & Cardiac Risk Stratification   Activity Barriers Arthritis;Back Problems;Left Hip Replacement;Joint Problems;Deconditioning;Shortness of Breath;History of Falls;Assistive Device;Balance Concerns    Cardiac Risk Stratification Low             6 Minute Walk:  6 Minute  Walk     Row Name 06/30/21 1435         6 Minute Walk   Phase Initial     Distance 550 feet     Walk Time 6 minutes     # of Rest Breaks 2     MPH 1     METS 0.96     RPE 13     Perceived Dyspnea  15     VO2 Peak 3.35     Symptoms Yes (comment)     Comments used rollator. Two standing breaks due to SOB     Resting HR 71 bpm     Resting BP 126/70     Resting Oxygen Saturation  96 %     Exercise Oxygen Saturation  during 6 min walk 93 %     Max Ex. HR 106 bpm     Max Ex. BP 154/60     2 Minute Post BP 126/68           Interval HR   1 Minute HR 92     2 Minute HR 100     3 Minute HR 96     4 Minute HR 106     5 Minute HR 99     6 Minute HR 98     2 Minute Post HR 82     Interval Heart Rate? Yes           Interval Oxygen   Interval Oxygen? Yes     Baseline Oxygen Saturation % 96 %     1 Minute Oxygen Saturation % 96 %  1 Minute Liters of Oxygen 4 L     2 Minute Oxygen Saturation % 95 %     2 Minute Liters of Oxygen 4 L     3 Minute Oxygen Saturation % 93 %     3 Minute Liters of Oxygen 4 L     4 Minute Oxygen Saturation % 95 %     4 Minute Liters of Oxygen 4 L     5 Minute Oxygen Saturation % 94 %     5 Minute Liters of Oxygen 4 L     6 Minute Oxygen Saturation % 94 %     6 Minute Liters of Oxygen 4 L     2 Minute Post Oxygen Saturation % 97 %     2 Minute Post Liters of Oxygen 4 L              Oxygen Initial Assessment:  Oxygen Initial Assessment - 06/30/21 1308       Home Oxygen   Home Oxygen Device Home Concentrator;E-Tanks    Sleep Oxygen Prescription BiPAP    Liters per minute 3    Home Resting Oxygen Prescription Continuous    Liters per minute 3      Initial 6 min Walk   Oxygen Used Continuous    Liters per minute 4      Intervention   Short Term Goals To learn and exhibit compliance with exercise, home and travel O2 prescription;To learn and understand importance of monitoring SPO2 with pulse oximeter and demonstrate accurate use of  the pulse oximeter.;To learn and understand importance of maintaining oxygen saturations>88%;To learn and demonstrate proper pursed lip breathing techniques or other breathing techniques.     Long  Term Goals Exhibits compliance with exercise, home  and travel O2 prescription;Verbalizes importance of monitoring SPO2 with pulse oximeter and return demonstration;Maintenance of O2 saturations>88%;Exhibits proper breathing techniques, such as pursed lip breathing or other method taught during program session             Oxygen Re-Evaluation:  Oxygen Re-Evaluation     Row Name 07/13/21 1437             Program Oxygen Prescription   Program Oxygen Prescription Continuous;E-Tanks       Liters per minute 4               Home Oxygen   Home Oxygen Device Home Concentrator;E-Tanks       Sleep Oxygen Prescription BiPAP       Liters per minute 3       Home Exercise Oxygen Prescription Continuous       Liters per minute 3       Home Resting Oxygen Prescription Continuous       Liters per minute 3       Compliance with Home Oxygen Use Yes               Goals/Expected Outcomes   Short Term Goals To learn and exhibit compliance with exercise, home and travel O2 prescription;To learn and understand importance of monitoring SPO2 with pulse oximeter and demonstrate accurate use of the pulse oximeter.;To learn and understand importance of maintaining oxygen saturations>88%;To learn and demonstrate proper pursed lip breathing techniques or other breathing techniques.        Long  Term Goals Exhibits compliance with exercise, home  and travel O2 prescription;Verbalizes importance of monitoring SPO2 with pulse oximeter and return demonstration;Maintenance of O2 saturations>88%;Exhibits proper breathing techniques, such as  pursed lip breathing or other method taught during program session       Goals/Expected Outcomes compliance                Oxygen Discharge (Final Oxygen Re-Evaluation):   Oxygen Re-Evaluation - 07/13/21 1437       Program Oxygen Prescription   Program Oxygen Prescription Continuous;E-Tanks    Liters per minute 4      Home Oxygen   Home Oxygen Device Home Concentrator;E-Tanks    Sleep Oxygen Prescription BiPAP    Liters per minute 3    Home Exercise Oxygen Prescription Continuous    Liters per minute 3    Home Resting Oxygen Prescription Continuous    Liters per minute 3    Compliance with Home Oxygen Use Yes      Goals/Expected Outcomes   Short Term Goals To learn and exhibit compliance with exercise, home and travel O2 prescription;To learn and understand importance of monitoring SPO2 with pulse oximeter and demonstrate accurate use of the pulse oximeter.;To learn and understand importance of maintaining oxygen saturations>88%;To learn and demonstrate proper pursed lip breathing techniques or other breathing techniques.     Long  Term Goals Exhibits compliance with exercise, home  and travel O2 prescription;Verbalizes importance of monitoring SPO2 with pulse oximeter and return demonstration;Maintenance of O2 saturations>88%;Exhibits proper breathing techniques, such as pursed lip breathing or other method taught during program session    Goals/Expected Outcomes compliance             Initial Exercise Prescription:  Initial Exercise Prescription - 06/30/21 1400       Date of Initial Exercise RX and Referring Provider   Date 06/30/21    Referring Provider Dr. Lula Olszewski    Expected Discharge Date 10/28/21      Oxygen   Oxygen Continuous    Liters 4      NuStep   Level 1    SPM 60    Minutes 39      Prescription Details   Frequency (times per week) 2    Duration Progress to 30 minutes of continuous aerobic without signs/symptoms of physical distress      Intensity   THRR 40-80% of Max Heartrate 60-120    Ratings of Perceived Exertion 11-13    Perceived Dyspnea 0-4      Resistance Training   Training Prescription Yes    Weight 3  lbs    Reps 10-15             Perform Capillary Blood Glucose checks as needed.  Exercise Prescription Changes:   Exercise Prescription Changes     Row Name 07/13/21 1400             Response to Exercise   Blood Pressure (Admit) 158/68       Blood Pressure (Exercise) 148/66       Blood Pressure (Exit) 118/62       Heart Rate (Admit) 92 bpm       Heart Rate (Exercise) 104 bpm       Heart Rate (Exit) 101 bpm       Oxygen Saturation (Admit) 95 %       Oxygen Saturation (Exercise) 93 %       Oxygen Saturation (Exit) 94 %       Rating of Perceived Exertion (Exercise) 12       Perceived Dyspnea (Exercise) 12       Duration Continue with 30 min of aerobic exercise without  signs/symptoms of physical distress.       Intensity THRR unchanged               Progression   Progression Continue to progress workloads to maintain intensity without signs/symptoms of physical distress.               Resistance Training   Training Prescription Yes       Weight 4 lbs       Reps 10-15       Time 10 Minutes               Oxygen   Oxygen Continuous       Liters 4               NuStep   Level 1       SPM 89       Minutes 39       METs 2                Exercise Comments:   Exercise Goals and Review:   Exercise Goals     Row Name 06/30/21 1438 07/13/21 1440           Exercise Goals   Increase Physical Activity Yes Yes      Intervention Provide advice, education, support and counseling about physical activity/exercise needs.;Develop an individualized exercise prescription for aerobic and resistive training based on initial evaluation findings, risk stratification, comorbidities and participant's personal goals. Provide advice, education, support and counseling about physical activity/exercise needs.;Develop an individualized exercise prescription for aerobic and resistive training based on initial evaluation findings, risk stratification, comorbidities and  participant's personal goals.      Expected Outcomes Short Term: Attend rehab on a regular basis to increase amount of physical activity.;Long Term: Add in home exercise to make exercise part of routine and to increase amount of physical activity.;Long Term: Exercising regularly at least 3-5 days a week. Short Term: Attend rehab on a regular basis to increase amount of physical activity.;Long Term: Add in home exercise to make exercise part of routine and to increase amount of physical activity.;Long Term: Exercising regularly at least 3-5 days a week.      Increase Strength and Stamina Yes Yes      Intervention Provide advice, education, support and counseling about physical activity/exercise needs.;Develop an individualized exercise prescription for aerobic and resistive training based on initial evaluation findings, risk stratification, comorbidities and participant's personal goals. Provide advice, education, support and counseling about physical activity/exercise needs.;Develop an individualized exercise prescription for aerobic and resistive training based on initial evaluation findings, risk stratification, comorbidities and participant's personal goals.      Expected Outcomes Short Term: Increase workloads from initial exercise prescription for resistance, speed, and METs.;Short Term: Perform resistance training exercises routinely during rehab and add in resistance training at home;Long Term: Improve cardiorespiratory fitness, muscular endurance and strength as measured by increased METs and functional capacity (6MWT) Short Term: Increase workloads from initial exercise prescription for resistance, speed, and METs.;Short Term: Perform resistance training exercises routinely during rehab and add in resistance training at home;Long Term: Improve cardiorespiratory fitness, muscular endurance and strength as measured by increased METs and functional capacity (6MWT)      Able to understand and use rate of  perceived exertion (RPE) scale Yes Yes      Intervention Provide education and explanation on how to use RPE scale Provide education and explanation on how to use RPE scale  Expected Outcomes Short Term: Able to use RPE daily in rehab to express subjective intensity level;Long Term:  Able to use RPE to guide intensity level when exercising independently Short Term: Able to use RPE daily in rehab to express subjective intensity level;Long Term:  Able to use RPE to guide intensity level when exercising independently      Able to understand and use Dyspnea scale Yes Yes      Intervention Provide education and explanation on how to use Dyspnea scale Provide education and explanation on how to use Dyspnea scale      Expected Outcomes Short Term: Able to use Dyspnea scale daily in rehab to express subjective sense of shortness of breath during exertion;Long Term: Able to use Dyspnea scale to guide intensity level when exercising independently Short Term: Able to use Dyspnea scale daily in rehab to express subjective sense of shortness of breath during exertion;Long Term: Able to use Dyspnea scale to guide intensity level when exercising independently      Knowledge and understanding of Target Heart Rate Range (THRR) Yes Yes      Intervention Provide education and explanation of THRR including how the numbers were predicted and where they are located for reference Provide education and explanation of THRR including how the numbers were predicted and where they are located for reference      Expected Outcomes Short Term: Able to state/look up THRR;Short Term: Able to use daily as guideline for intensity in rehab;Long Term: Able to use THRR to govern intensity when exercising independently Short Term: Able to state/look up THRR;Short Term: Able to use daily as guideline for intensity in rehab;Long Term: Able to use THRR to govern intensity when exercising independently      Understanding of Exercise Prescription  Yes Yes      Intervention Provide education, explanation, and written materials on patient's individual exercise prescription Provide education, explanation, and written materials on patient's individual exercise prescription      Expected Outcomes Short Term: Able to explain program exercise prescription;Long Term: Able to explain home exercise prescription to exercise independently Short Term: Able to explain program exercise prescription;Long Term: Able to explain home exercise prescription to exercise independently               Exercise Goals Re-Evaluation :  Exercise Goals Re-Evaluation     Row Name 07/13/21 1440             Exercise Goal Re-Evaluation   Exercise Goals Review Increase Physical Activity;Increase Strength and Stamina;Able to understand and use rate of perceived exertion (RPE) scale;Knowledge and understanding of Target Heart Rate Range (THRR);Able to check pulse independently;Understanding of Exercise Prescription       Comments Pt has completed 3 exercise sessions. He is deconditioned and limited by right hip pain, but he does give good effort while he is in rehab. He is currently exercising at 2.0 METs on the stepper. Will continue to monitor and progress as able.       Expected Outcomes Through exercise at rehab and at home, the patient will meet their stated goals.                Discharge Exercise Prescription (Final Exercise Prescription Changes):  Exercise Prescription Changes - 07/13/21 1400       Response to Exercise   Blood Pressure (Admit) 158/68    Blood Pressure (Exercise) 148/66    Blood Pressure (Exit) 118/62    Heart Rate (Admit) 92 bpm  Heart Rate (Exercise) 104 bpm    Heart Rate (Exit) 101 bpm    Oxygen Saturation (Admit) 95 %    Oxygen Saturation (Exercise) 93 %    Oxygen Saturation (Exit) 94 %    Rating of Perceived Exertion (Exercise) 12    Perceived Dyspnea (Exercise) 12    Duration Continue with 30 min of aerobic exercise  without signs/symptoms of physical distress.    Intensity THRR unchanged      Progression   Progression Continue to progress workloads to maintain intensity without signs/symptoms of physical distress.      Resistance Training   Training Prescription Yes    Weight 4 lbs    Reps 10-15    Time 10 Minutes      Oxygen   Oxygen Continuous    Liters 4      NuStep   Level 1    SPM 89    Minutes 39    METs 2             Nutrition:  Target Goals: Understanding of nutrition guidelines, daily intake of sodium '1500mg'$ , cholesterol '200mg'$ , calories 30% from fat and 7% or less from saturated fats, daily to have 5 or more servings of fruits and vegetables.  Biometrics:  Pre Biometrics - 06/30/21 1439       Pre Biometrics   Height '5\' 8"'$  (1.727 m)    Weight 126.7 kg    Waist Circumference 55 inches    Hip Circumference 51 inches    Waist to Hip Ratio 1.08 %    BMI (Calculated) 42.48    Triceps Skinfold 7 mm    % Body Fat 37.3 %    Grip Strength 28 kg    Flexibility 0 in    Single Leg Stand 0 seconds              Nutrition Therapy Plan and Nutrition Goals:  Nutrition Therapy & Goals - 06/30/21 1410       Personal Nutrition Goals   Comments Patient scored 127 on his diet assessment. Score discussed with patient and his wife. They both state that he is not interested in changing his diet. Handout provided regarding DM control and making healthier choices. We provide 2 educational sessions regarding heart heathy nutrition with handouts and assistance with RD referral.      Intervention Plan   Intervention Nutrition handout(s) given to patient.             Nutrition Assessments:  Nutrition Assessments - 06/30/21 1414       MEDFICTS Scores   Pre Score 127            MEDIFICTS Score Key: ?70 Need to make dietary changes  40-70 Heart Healthy Diet ? 40 Therapeutic Level Cholesterol Diet   Picture Your Plate Scores: [id number] Unhealthy dietary pattern with  much room for improvement. 41-50 Dietary pattern unlikely to meet recommendations for good health and room for improvement. 51-60 More healthful dietary pattern, with some room for improvement.  >60 Healthy dietary pattern, although there may be some specific behaviors that could be improved.    Nutrition Goals Re-Evaluation:   Nutrition Goals Discharge (Final Nutrition Goals Re-Evaluation):   Psychosocial: Target Goals: Acknowledge presence or absence of significant depression and/or stress, maximize coping skills, provide positive support system. Participant is able to verbalize types and ability to use techniques and skills needed for reducing stress and depression.  Initial Review & Psychosocial Screening:  Initial Psych Review &  Screening - 06/30/21 1448       Initial Review   Current issues with None Identified      Family Dynamics   Good Support System? Yes      Barriers   Psychosocial barriers to participate in program Psychosocial barriers identified (see note)      Screening Interventions   Interventions Encouraged to exercise;Program counselor consult;Provide feedback about the scores to participant;To provide support and resources with identified psychosocial needs    Expected Outcomes Short Term goal: Utilizing psychosocial counselor, staff and physician to assist with identification of specific Stressors or current issues interfering with healing process. Setting desired goal for each stressor or current issue identified.;Long Term Goal: Stressors or current issues are controlled or eliminated.;Short Term goal: Identification and review with participant of any Quality of Life or Depression concerns found by scoring the questionnaire.             Quality of Life Scores:  Quality of Life - 06/30/21 1434       Quality of Life   Select Quality of Life      Quality of Life Scores   Health/Function Pre 12.66 %    Socioeconomic Pre 11.8 %    Psych/Spiritual Pre  11.79 %    Family Pre 12.4 %    GLOBAL Pre 12.3 %            Scores of 19 and below usually indicate a poorer quality of life in these areas.  A difference of  2-3 points is a clinically meaningful difference.  A difference of 2-3 points in the total score of the Quality of Life Index has been associated with significant improvement in overall quality of life, self-image, physical symptoms, and general health in studies assessing change in quality of life.   PHQ-9: Recent Review Flowsheet Data     Depression screen [facility] 2/9 06/30/2021 12/31/2018 09/28/2018 08/07/2018   Decreased Interest 0 0 0 1   Down, Depressed, Hopeless 0 0 0 2   PHQ - 2 Score 0 0 0 3   Altered sleeping 0 0 0 2   Tired, decreased energy 0 '1 1 2   '$ Change in appetite 1 0 0 0   Feeling bad or failure about yourself  0 0 0 0   Trouble concentrating 0 0 0 0   Moving slowly or fidgety/restless 0 0 0 0   Suicidal thoughts 0 0 0 0   PHQ-9 Score '1 1 1 7   '$ Difficult doing work/chores Not difficult at all Not difficult at all Not difficult at all Very difficult      Interpretation of Total Score  Total Score Depression Severity:  1-4 = Minimal depression, 5-9 = Mild depression, 10-14 = Moderate depression, 15-19 = Moderately severe depression, 20-27 = Severe depression   Psychosocial Evaluation and Intervention:  Psychosocial Evaluation - 06/30/21 1449       Psychosocial Evaluation & Interventions   Interventions Encouraged to exercise with the program and follow exercise prescription;Stress management education;Relaxation education    Comments Patient has no psychosocial barriers identified to participate in PR. His initial PHQ-9 score was 1 and his QOL score 12.3% overall scoreing 11 and 12% in all categories. He denies any current or history of depression, anxiety, or stress. His wife is present with him today whom he says is his emotional support person. They both have children of their own but none together. He has 3  sons that all live out-of[place]state and  do not contact him often. She has 2 daughters that live near and he sees and talks to them often. He has polyarthritis which limits his mobility along with COPD that prevents him from doing activities. His long term goal is to get off of oxygen. For the program, he would like for his SOB to improve and to get stronger to be able to do more activities. He scored 127 on his diet assessment and stated he was not interested in changing his diet and that he "loves to eat". Difficult to measure his motivation to improve his health overall but he did demonstate an interest in participating in the program to hopefully meet his goals.    Expected Outcomes Patient will continue to have no psychosocial barriers identififed.    Continue Psychosocial Services  No Follow up required             Psychosocial Re-Evaluation:  Psychosocial Re-Evaluation     [place] Name 07/07/21 1150             Psychosocial Re-Evaluation   Current issues with None Identified       Comments Patient is new to the program. He continues to have no psychosocial barriers identified. He has completed 1 session. We will continue to montior his progress.       Expected Outcomes Patient will continue to have no psychosocial barriers identified.       Interventions Stress management education;Encouraged to attend Pulmonary Rehabilitation for the exercise;Relaxation education       Continue Psychosocial Services  No Follow up required                Psychosocial Discharge (Final Psychosocial Re-Evaluation):  Psychosocial Re-Evaluation - 07/07/21 1150       Psychosocial Re-Evaluation   Current issues with None Identified    Comments Patient is new to the program. He continues to have no psychosocial barriers identified. He has completed 1 session. We will continue to montior his progress.    Expected Outcomes Patient will continue to have no psychosocial barriers identified.    Interventions  Stress management education;Encouraged to attend Pulmonary Rehabilitation for the exercise;Relaxation education    Continue Psychosocial Services  No Follow up required              Education: Education Goals: Education classes will be provided on a weekly basis, covering required topics. Participant will state understanding/return demonstration of topics presented.  Learning Barriers/Preferences:  Learning Barriers/Preferences - 06/30/21 1414       Learning Barriers/Preferences   Learning Barriers Hearing    Learning Preferences Individual Instruction;Skilled Demonstration             Education Topics: How Lungs Work and Diseases: - Discuss the anatomy of the lungs and diseases that can affect the lungs, such as COPD.   Exercise: -Discuss the importance of exercise, FITT principles of exercise, normal and abnormal responses to exercise, and how to exercise safely.   Environmental Irritants: -Discuss types of environmental irritants and how to limit exposure to environmental irritants.   Meds/Inhalers and oxygen: - Discuss respiratory medications, definition of an inhaler and oxygen, and the proper way to use an inhaler and oxygen.   Energy Saving Techniques: - Discuss methods to conserve energy and decrease shortness of breath when performing activities of daily living.    Bronchial Hygiene / Breathing Techniques: - Discuss breathing mechanics, pursed-lip breathing technique,  proper posture, effective ways to clear airways, and other functional breathing techniques  Cleaning Equipment: - Provides group verbal and written instruction about the health risks of elevated stress, cause of high stress, and healthy ways to reduce stress. Flowsheet Row PULMONARY REHAB CHRONIC OBSTRUCTIVE PULMONARY DISEASE from 07/08/2021 in [place]  Date 07/08/21  Educator DF  Instruction Review Code 2- Demonstrated Understanding       Nutrition I:  Fats: - Discuss the types of cholesterol, what cholesterol does to the body, and how cholesterol levels can be controlled.   Nutrition II: Labels: -Discuss the different components of food labels and how to read food labels.   Respiratory Infections: - Discuss the signs and symptoms of respiratory infections, ways to prevent respiratory infections, and the importance of seeking medical treatment when having a respiratory infection.   Stress I: Signs and Symptoms: - Discuss the causes of stress, how stress may lead to anxiety and depression, and ways to limit stress.   Stress II: Relaxation: -Discuss relaxation techniques to limit stress.   Oxygen for Home/Travel: - Discuss how to prepare for travel when on oxygen and proper ways to transport and store oxygen to ensure safety.   Knowledge Questionnaire Score:  Knowledge Questionnaire Score - 06/30/21 1448       Knowledge Questionnaire Score   Pre Score 15/18             Core Components/Risk Factors/Patient Goals at Admission:  Personal Goals and Risk Factors at Admission - 06/30/21 1416       Core Components/Risk Factors/Patient Goals on Admission    Weight Management Obesity    Improve shortness of breath with ADL's Yes    Intervention Provide education, individualized exercise plan and daily activity instruction to help decrease symptoms of SOB with activities of daily living.    Expected Outcomes Short Term: Improve cardiorespiratory fitness to achieve a reduction of symptoms when performing ADLs;Long Term: Be able to perform more ADLs without symptoms or delay the onset of symptoms    Diabetes Yes    Intervention Provide education about signs/symptoms and action to take for hypo/hyperglycemia.;Provide education about proper nutrition, including hydration, and aerobic/resistive exercise prescription along with prescribed medications to achieve blood glucose in normal ranges: Fasting glucose 65-99 mg/dL    Expected  Outcomes Short Term: Participant verbalizes understanding of the signs/symptoms and immediate care of hyper/hypoglycemia, proper foot care and importance of medication, aerobic/resistive exercise and nutrition plan for blood glucose control.    Personal Goal Other Yes    Personal Goal Improve SOB. Get stronger and strenghten lungs to be able to do more activities.    Intervention Patient will attend PR 2 days/week and supplement with exercise 3 days/week.    Expected Outcomes Patient will complete the program and meet both personal and program.             Core Components/Risk Factors/Patient Goals Review:   Goals and Risk Factor Review     Row Name 07/07/21 1152             Core Components/Risk Factors/Patient Goals Review   Personal Goals Review Weight Management/Obesity;Improve shortness of breath with ADL's;Diabetes;Hypertension;Stress       Review Patient was referred to PR with COPD. He has completed 1 session. His goals for the program are to improve his SOB; get stronger and be able to do more activities. We will continue to monitor his progress as he works towards meeting these goals.       Expected Outcomes Patient will complete the program meeting both  program and personal goals.                Core Components/Risk Factors/Patient Goals at Discharge (Final Review):   Goals and Risk Factor Review - 07/07/21 1152       Core Components/Risk Factors/Patient Goals Review   Personal Goals Review Weight Management/Obesity;Improve shortness of breath with ADL's;Diabetes;Hypertension;Stress    Review Patient was referred to PR with COPD. He has completed 1 session. His goals for the program are to improve his SOB; get stronger and be able to do more activities. We will continue to monitor his progress as he works towards meeting these goals.    Expected Outcomes Patient will complete the program meeting both program and personal goals.             ITP  Comments:   Comments: ITP REVIEW Pt is making expected progress toward pulmonary rehab goals after completing 4 sessions. Recommend continued exercise, life style modification, education, and utilization of breathing techniques to increase stamina and strength and decrease shortness of breath with exertion.

## 2021-07-15 ENCOUNTER — Encounter (HOSPITAL_COMMUNITY): Payer: No Typology Code available for payment source

## 2021-07-20 ENCOUNTER — Other Ambulatory Visit: Payer: Self-pay

## 2021-07-20 ENCOUNTER — Encounter (HOSPITAL_COMMUNITY)
Admission: RE | Admit: 2021-07-20 | Discharge: 2021-07-20 | Disposition: A | Discharge status: Home / Self Care | Payer: No Typology Code available for payment source | Source: Ambulatory Visit | Attending: *Deleted | Admitting: *Deleted

## 2021-07-20 DIAGNOSIS — J449 Chronic obstructive pulmonary disease, unspecified: Secondary | ICD-10-CM | POA: Diagnosis not present

## 2021-07-20 NOTE — Progress Notes (Signed)
Daily Session Note  Patient Details  Name: Ricky GIRAUD Sr. MRN: [id number] Date of Birth: 1951-09-26 Referring Provider:   Flowsheet Row PULMONARY REHAB COPD ORIENTATION from 06/30/2021 in [place]  Referring Provider Dr. Lula Olszewski       Encounter Date: 07/20/2021  Check In:  Session Check In - 07/20/21 1330       Check-In   Supervising physician immediately available to respond to emergencies [facility] MD immediately available    Physician(s) Dr. Harl Bowie    Location [facility]    Staff Present Geanie Cooley, RN;Dalton Kris Mouton, MS, ACSM-CEP, Exercise Physiologist    Virtual Visit No    Medication changes reported     No    Fall or balance concerns reported    Yes    Comments using rollator for balance    Tobacco Cessation No Change    Warm-up and Cool-down Performed as group-led instruction    Resistance Training Performed Yes    VAD Patient? No    PAD/SET Patient? No      Pain Assessment   Currently in Pain? Yes    Pain Score 6     Pain Location Hip    Pain Orientation Right    Pain Descriptors / Indicators Constant    Pain Type Chronic pain    Pain Onset More than a month ago    Pain Frequency Constant    Multiple Pain Sites No             Capillary Blood Glucose: No results found for this or any previous visit (from the past 24 hour(s)).    Social History   Tobacco Use  Smoking Status Former   Packs/day: 1.00   Years: 33.00   Pack years: 33.00   Types: Cigarettes   Quit date: 04/06/2000   Years since quitting: 21.3  Smokeless Tobacco Never  Tobacco Comments   use to chew cigars    Goals Met:  Proper associated with RPD/PD & O2 Sat Independence with exercise equipment Using PLB without cueing & demonstrates good technique Exercise tolerated well No report of cardiac concerns or symptoms Strength training completed today  Goals Unmet:  Not Applicable  Comments: check out @ 2:30pm   Dr.  Kathie Dike is Medical Director for [facility] Pulmonary Rehab.

## 2021-07-22 ENCOUNTER — Encounter (HOSPITAL_COMMUNITY)
Admission: RE | Admit: 2021-07-22 | Discharge: 2021-07-22 | Disposition: A | Discharge status: Home / Self Care | Payer: No Typology Code available for payment source | Source: Ambulatory Visit | Attending: *Deleted | Admitting: *Deleted

## 2021-07-22 ENCOUNTER — Other Ambulatory Visit: Payer: Self-pay

## 2021-07-22 DIAGNOSIS — J449 Chronic obstructive pulmonary disease, unspecified: Secondary | ICD-10-CM | POA: Diagnosis not present

## 2021-07-22 NOTE — Progress Notes (Signed)
Daily Session Note  Patient Details  Name: Ricky MORALES Sr. MRN: [id number] Date of Birth: [birth year] Referring Provider:   Flowsheet Row PULMONARY REHAB COPD ORIENTATION from 06/30/2021 in [place]  Referring Provider Dr. Lula Olszewski       Encounter Date: 07/22/2021  Check In:  Session Check In - 07/22/21 1330       Check-In   Supervising physician immediately available to respond to emergencies [facility] MD immediately available    Physician(s) Dr. Harl Bowie    Location [facility]    Staff Present Geanie Cooley, Kermit Balo, RN, BSN    Virtual Visit No    Medication changes reported     No    Fall or balance concerns reported    Yes    Comments using rollator for balance    Tobacco Cessation No Change    Warm-up and Cool-down Performed as group-led instruction    Resistance Training Performed Yes    VAD Patient? No    PAD/SET Patient? No      Pain Assessment   Currently in Pain? No/denies    Pain Score 4     Pain Location Hip    Pain Descriptors / Indicators Constant    Pain Type Chronic pain    Pain Onset More than a month ago    Pain Frequency Constant    Pain Relieving Factors tylenol 336m tabs prn    Multiple Pain Sites No             Capillary Blood Glucose: No results found for this or any previous visit (from the past 24 hour(s)).    Social History   Tobacco Use  Smoking Status Former   Packs/day: 1.00   Years: 33.00   Pack years: 33.00   Types: Cigarettes   Quit date: 04/06/2000   Years since quitting: 21.3  Smokeless Tobacco Never  Tobacco Comments   use to chew cigars    Goals Met:  Independence with exercise equipment Using PLB without cueing & demonstrates good technique Exercise tolerated well No report of concerns or symptoms today Strength training completed today  Goals Unmet:  Not Applicable  Comments: check out @ 2:30pm   Dr. JKathie Dikeis Medical Director for [facility].

## 2021-07-27 ENCOUNTER — Encounter (HOSPITAL_COMMUNITY): Payer: No Typology Code available for payment source

## 2021-07-29 ENCOUNTER — Other Ambulatory Visit: Payer: Self-pay

## 2021-07-29 ENCOUNTER — Encounter (HOSPITAL_COMMUNITY)
Admission: RE | Admit: 2021-07-29 | Discharge: 2021-07-29 | Disposition: A | Discharge status: Home / Self Care | Payer: No Typology Code available for payment source | Source: Ambulatory Visit | Attending: *Deleted | Admitting: *Deleted

## 2021-07-29 DIAGNOSIS — J449 Chronic obstructive pulmonary disease, unspecified: Secondary | ICD-10-CM | POA: Insufficient documentation

## 2021-07-29 NOTE — Progress Notes (Signed)
Daily Session Note  Patient Details  Name: Ricky GOING Sr. MRN: [id number] Date of Birth: 01/10/1951 Referring Provider:   Flowsheet Row PULMONARY REHAB COPD ORIENTATION from 06/30/2021 in [place]  Referring Provider Dr. Lula Olszewski       Encounter Date: 07/29/2021  Check In:  Session Check In - 07/29/21 1330       Check-In   Supervising physician immediately available to respond to emergencies [facility] immediately available    Physician(s) Dr. Harl Bowie    Location [facility]    Staff Present Aundra Dubin, RN, Bjorn Loser, MS, ACSM-CEP, Exercise Physiologist    Virtual Visit No    Fall or balance concerns reported    Yes    Comments using rollator for balance    Tobacco Cessation No Change    Warm-up and Cool-down Performed as group-led instruction    Resistance Training Performed Yes    VAD Patient? No    PAD/SET Patient? No      Pain Assessment   Currently in Pain? Yes    Pain Score 9     Pain Location Hip    Pain Orientation Right    Pain Descriptors / Indicators Constant    Pain Type Chronic pain    Pain Onset More than a month ago    Pain Frequency Constant    Pain Relieving Factors Tylenol    Multiple Pain Sites No             Capillary Blood Glucose: No results found for this or any previous visit (from the past 24 hour(s)).    Social History   Tobacco Use  Smoking Status Former   Packs/day: 1.00   Years: 33.00   Pack years: 33.00   Types: Cigarettes   Quit date: 04/06/2000   Years since quitting: 21.3  Smokeless Tobacco Never  Tobacco Comments   use to chew cigars    Goals Met:  Proper associated with RPD/PD & O2 Sat Independence with exercise equipment Using PLB without cueing & demonstrates good technique Exercise tolerated well No report of concerns or symptoms today Strength training completed today  Goals Unmet:  Not Applicable  Comments: Check out 1430.   Dr. Kathie Dike  is Medical Director for [facility] Pulmonary Rehab.

## 2021-08-03 ENCOUNTER — Encounter (HOSPITAL_COMMUNITY)
Admission: RE | Admit: 2021-08-03 | Discharge: 2021-08-03 | Disposition: A | Discharge status: Home / Self Care | Payer: No Typology Code available for payment source | Source: Ambulatory Visit | Attending: *Deleted | Admitting: *Deleted

## 2021-08-03 ENCOUNTER — Other Ambulatory Visit: Payer: Self-pay

## 2021-08-03 DIAGNOSIS — J449 Chronic obstructive pulmonary disease, unspecified: Secondary | ICD-10-CM

## 2021-08-03 NOTE — Progress Notes (Signed)
Daily Session Note  Patient Details  Name: Ricky MANSEL Sr. MRN: [id number] Date of Birth: 1951-02-03 Referring Provider:   Flowsheet Row PULMONARY REHAB COPD ORIENTATION from 06/30/2021 in [place]  Referring Provider Dr. Lula Olszewski       Encounter Date: 08/03/2021  Check In:  Session Check In - 08/03/21 1330       Check-In   Supervising physician immediately available to respond to emergencies [facility] immediately available    Physician(s) Dr. Harrington Challenger    Location [facility]    Staff Present Geanie Cooley, RN;Dalton Fletcher, MS, ACSM-CEP, Exercise Physiologist    Virtual Visit No    Medication changes reported     No    Fall or balance concerns reported    No    Comments using rollator for balance    Tobacco Cessation No Change    Warm-up and Cool-down Performed as group-led instruction    Resistance Training Performed Yes    VAD Patient? No    PAD/SET Patient? No      Pain Assessment   Currently in Pain? Yes    Pain Score 4     Pain Location Hip    Pain Orientation Right    Pain Descriptors / Indicators Constant    Pain Type Chronic pain    Pain Onset More than a month ago    Pain Frequency Constant    Multiple Pain Sites No             Capillary Blood Glucose: No results found for this or any previous visit (from the past 24 hour(s)).    Social History   Tobacco Use  Smoking Status Former   Packs/day: 1.00   Years: 33.00   Pack years: 33.00   Types: Cigarettes   Quit date: 04/06/2000   Years since quitting: 21.3  Smokeless Tobacco Never  Tobacco Comments   use to chew cigars    Goals Met:  Proper associated with RPD/PD & O2 Sat Independence with exercise equipment Improved SOB with ADL's Exercise tolerated well No report of concerns or symptoms today Strength training completed today  Goals Unmet:  Not Applicable  Comments: check out @ 2:30pm   Dr. Kathie Dike is Medical Director for  [facility] Pulmonary Rehab.

## 2021-08-05 ENCOUNTER — Other Ambulatory Visit: Payer: Self-pay

## 2021-08-05 ENCOUNTER — Encounter (HOSPITAL_COMMUNITY)
Admission: RE | Admit: 2021-08-05 | Discharge: 2021-08-05 | Disposition: A | Discharge status: Home / Self Care | Payer: No Typology Code available for payment source | Source: Ambulatory Visit | Attending: *Deleted | Admitting: *Deleted

## 2021-08-05 DIAGNOSIS — J449 Chronic obstructive pulmonary disease, unspecified: Secondary | ICD-10-CM

## 2021-08-05 NOTE — Progress Notes (Signed)
Daily Session Note  Patient Details  Name: Ricky Johnston. MRN: [id number] Date of Birth: [birth year] Referring Provider:   Flowsheet Row PULMONARY REHAB COPD ORIENTATION from 06/30/2021 in [place]  Referring Provider Dr. Lula Olszewski       Encounter Date: 08/05/2021  Check In:  Session Check In - 08/05/21 1330       Check-In   Supervising physician immediately available to respond to emergencies [facility] immediately available    Physician(s) Dr. Harl Bowie    Location [facility]    Staff Present Geanie Cooley, RN;Debra Wynetta Emery, RN, BSN    Virtual Visit No    Medication changes reported     No    Fall or balance concerns reported    No    Comments using rollator for balance    Tobacco Cessation No Change    Warm-up and Cool-down Performed as group-led instruction    Resistance Training Performed Yes    VAD Patient? No    PAD/SET Patient? No      Pain Assessment   Currently in Pain? Yes    Pain Score 2     Pain Location Hip    Pain Orientation Right    Pain Descriptors / Indicators Constant    Pain Type Chronic pain    Pain Onset More than a month ago    Pain Frequency Constant    Multiple Pain Sites No             Capillary Blood Glucose: No results found for this or any previous visit (from the past 24 hour(s)).    Social History   Tobacco Use  Smoking Status Former   Packs/day: 1.00   Years: 33.00   Pack years: 33.00   Types: Cigarettes   Quit date: 04/06/2000   Years since quitting: 21.3  Smokeless Tobacco Never  Tobacco Comments   use to chew cigars    Goals Met:  Proper associated with RPD/PD & O2 Sat Independence with exercise equipment Improved SOB with ADL's Exercise tolerated well No report of concerns or symptoms today Strength training completed today  Goals Unmet:  Not Applicable  Comments: check out @ 2:45   Dr. Kathie Dike is Medical Director for [facility] Pulmonary Rehab.

## 2021-08-10 ENCOUNTER — Ambulatory Visit: Payer: Medicare Other | Admitting: Podiatry

## 2021-08-10 ENCOUNTER — Encounter (HOSPITAL_COMMUNITY): Payer: No Typology Code available for payment source

## 2021-08-11 NOTE — Progress Notes (Signed)
Pulmonary Individual Treatment Plan  Patient Details  Name: Ricky CROMARTIE Sr. MRN: [id number] Date of Birth: 12/13/1950 Referring Provider:   Flowsheet Row PULMONARY REHAB COPD ORIENTATION from 06/30/2021 in [facility]  Referring Provider Dr. Lula Olszewski       Initial Encounter Date:  Flowsheet Row PULMONARY REHAB COPD ORIENTATION from 06/30/2021 in [facility]  Date 06/30/21       Visit Diagnosis: Chronic obstructive pulmonary disease, unspecified COPD type ([place])  Patient's Home Medications on Admission:   Current Outpatient Medications:    acetaminophen (TYLENOL) 325 MG tablet, Take 3 tablets (975 mg total) by mouth every 8 (eight) hours as needed for mild pain. (Patient taking differently: Take 975 mg by mouth 2 (two) times daily as needed for mild pain. ), Disp: , Rfl:    albuterol (PROVENTIL HFA;VENTOLIN HFA) 108 (90 Base) MCG/ACT inhaler, Inhale 2 puffs into the lungs every 6 (six) hours as needed for wheezing or shortness of breath., Disp: , Rfl:    ammonium lactate (LAC-HYDRIN) 12 % lotion, Apply 1 application topically daily as needed for dry skin., Disp: , Rfl:    aspirin 81 MG chewable tablet, Chew 1 tablet (81 mg total) by mouth 2 (two) times daily after a meal., Disp: 60 tablet, Rfl: 0   atorvastatin (LIPITOR) 80 MG tablet, Take 80 mg by mouth at bedtime., Disp: , Rfl:    cetirizine (ZYRTEC) 10 MG tablet, Take 10 mg by mouth daily as needed for allergies., Disp: , Rfl:    cholecalciferol (VITAMIN D) 1000 UNITS tablet, Take 1,000 Units by mouth daily. , Disp: , Rfl:    diclofenac Sodium (VOLTAREN) 1 % GEL, Apply topically., Disp: , Rfl:    empagliflozin (JARDIANCE) 25 MG TABS tablet, Take 25 mg by mouth daily., Disp: , Rfl:    ferrous sulfate 325 (65 FE) MG tablet, Take 325 mg by mouth daily. , Disp: , Rfl:    fluticasone (FLONASE) 50 MCG/ACT nasal spray, , Disp: , Rfl:    Fluticasone-Salmeterol (ADVAIR) 250-50 MCG/DOSE AEPB,  INHALE 1 PUFF BY ORAL INHALATION TWO TIMES A DAY **REPLACES SYMBICORT INHALER**, Disp: , Rfl:    furosemide (LASIX) 20 MG tablet, Take 40 mg by mouth daily. , Disp: , Rfl:    gabapentin (NEURONTIN) 300 MG capsule, Take 600 mg by mouth 2 (two) times daily. , Disp: , Rfl:    HUMULIN R U-500 KWIKPEN 500 UNIT/ML kwikpen, 3 (three) times daily with meals. 110 unit with breakfast 110 units with lunch 50-60 units with supper, Disp: , Rfl:    hydrocortisone 2.5 % lotion, Apply topically., Disp: , Rfl:    ketoconazole (NIZORAL) 2 % shampoo, Apply topically., Disp: , Rfl:    losartan (COZAAR) 25 MG tablet, Take 25 mg by mouth daily., Disp: , Rfl:    metFORMIN (GLUCOPHAGE) 1000 MG tablet, Take 1,000 mg by mouth 2 (two) times daily with a meal., Disp: , Rfl:    omeprazole (PRILOSEC) 20 MG capsule, Take 20 mg by mouth 2 (two) times daily before a meal., Disp: , Rfl:    OXYGEN, Inhale 1 L into the lungs continuous. At night time may increase to 2 L as needed for shortness of breath, Disp: , Rfl:    rOPINIRole (REQUIP) 0.5 MG tablet, Take 1 tablet by mouth at bedtime., Disp: , Rfl:    rOPINIRole (REQUIP) 1 MG tablet, Take 1 tablet by mouth at bedtime., Disp: , Rfl:    selenium sulfide (SELSUN) 2.5 %  shampoo, Apply topically., Disp: , Rfl:    SEMAGLUTIDE, 1 MG/DOSE, [place], Inject 1 mg into the skin once a week., Disp: , Rfl:    sodium chloride (ALTAMIST SPRAY) 0.65 % nasal spray, Place into the nose., Disp: , Rfl:    tamsulosin (FLOMAX) 0.4 MG CAPS capsule, Take 0.4 mg by mouth daily., Disp: , Rfl:    tiotropium (SPIRIVA HANDIHALER) 18 MCG inhalation capsule, Place 18 mcg into inhaler and inhale daily., Disp: , Rfl:   Past Medical History: Past Medical History:  Diagnosis Date   Arthritis    Cancer ([place])    bladder cancer currently [id number]   Complication of anesthesia    hard for him to wake up from Anesthesia from left nephrectomy   COPD (chronic obstructive pulmonary disease) ([place])    Diabetes mellitus  without complication ([place])    DVT (deep venous thrombosis) ([place]) 01/08/2014, 2010   upper extremity   GERD (gastroesophageal reflux disease)    Hallux limitus 05/18/2015   from notes from [facility]    Headache    migraines   History of kidney stones    Hypercholesterolemia    Hypertension    Impaired hearing    Intervertebral disc syndrome    Kidney stones    Pulmonary fibrosis ([place])    Renal calculi 01/08/2014   frrom noted from [facility] [facility] .in chart   Restless legs 01/08/2014   Sciatic leg pain    paralysis of sciatic nerve   Sleep apnea    CPAP/BIPAP   Tinnitus     Tobacco Use: Social History   Tobacco Use  Smoking Status Former   Packs/day: 1.00   Years: 33.00   Pack years: 33.00   Types: Cigarettes   Quit date: 04/06/2000   Years since quitting: 21.3  Smokeless Tobacco Never  Tobacco Comments   use to chew cigars    Labs: Recent Review Flowsheet Data     Labs for ITP Cardiac and Pulmonary Rehab Latest Ref Rng & Units 01/08/2015 02/10/2017 02/10/2017 02/11/2017   Hemoglobin A1c 4.8 - 5.6 % 8.1(H) - - -   PHART 7.350 - 7.450 - 7.181(LL) 7.217(L) 7.298(L)   PCO2ART 32.0 - 48.0 mmHg - 82.8(HH) 73.1(HH) 62.5(H)   HCO3 20.0 - 28.0 mmol/L - 29.8(H) 28.6(H) 29.4(H)   ACIDBASEDEF 0.0 - 2.0 mmol/L - 0.7 0.8 -   O2SAT % - 98.5 93.5 96.1       Capillary Blood Glucose: Lab Results  Component Value Date   GLUCAP 228 (H) 06/30/2021   GLUCAP 181 (H) 09/07/2018   GLUCAP 91 08/24/2018   GLUCAP 119 (H) 08/24/2018   GLUCAP 339 (H) 08/22/2018     Pulmonary Assessment Scores:  Pulmonary Assessment Scores     Row Name 06/30/21 1426         ADL UCSD   ADL Phase Entry     SOB Score total 30     Rest 2     Walk 3     Stairs 5     Bath 3     Dress 3     Shop 0           CAT Score   CAT Score 26           mMRC Score   mMRC Score 4             UCSD: Self-administered rating of dyspnea associated with activities of daily living (ADLs) 6-point scale (0  = "not at all"  to 5 = "maximal or unable to do because of breathlessness")  Scoring Scores range from 0 to 120.  Minimally important difference is 5 units  CAT: CAT can identify the health impairment of COPD patients and is better correlated with disease progression.  CAT has a scoring range of zero to 40. The CAT score is classified into four groups of low (less than 10), medium (10 - 20), high (21-30) and very high (31-40) based on the impact level of disease on health status. A CAT score over 10 suggests significant symptoms.  A worsening CAT score could be explained by an exacerbation, poor medication adherence, poor inhaler technique, or progression of COPD or comorbid conditions.  CAT MCID is 2 points  mMRC: mMRC (Modified Medical Research Council) Dyspnea Scale is used to assess the degree of baseline functional disability in patients of respiratory disease due to dyspnea. No minimal important difference is established. A decrease in score of 1 point or greater is considered a positive change.   Pulmonary Function Assessment:   Exercise Target Goals: Exercise Program Goal: Individual exercise prescription set using results from initial 6 min walk test and THRR while considering  patient's activity barriers and safety.   Exercise Prescription Goal: Initial exercise prescription builds to 30-45 minutes a day of aerobic activity, 2-3 days per week.  Home exercise guidelines will be given to patient during program as part of exercise prescription that the participant will acknowledge.  Activity Barriers & Risk Stratification:  Activity Barriers & Cardiac Risk Stratification - 06/30/21 1307       Activity Barriers & Cardiac Risk Stratification   Activity Barriers Arthritis;Back Problems;Left Hip Replacement;Joint Problems;Deconditioning;Shortness of Breath;History of Falls;Assistive Device;Balance Concerns    Cardiac Risk Stratification Low             6 Minute Walk:  6 Minute  Walk     Row Name 06/30/21 1435         6 Minute Walk   Phase Initial     Distance 550 feet     Walk Time 6 minutes     # of Rest Breaks 2     MPH 1     METS 0.96     RPE 13     Perceived Dyspnea  15     VO2 Peak 3.35     Symptoms Yes (comment)     Comments used rollator. Two standing breaks due to SOB     Resting HR 71 bpm     Resting BP 126/70     Resting Oxygen Saturation  96 %     Exercise Oxygen Saturation  during 6 min walk 93 %     Max Ex. HR 106 bpm     Max Ex. BP 154/60     2 Minute Post BP 126/68           Interval HR   1 Minute HR 92     2 Minute HR 100     3 Minute HR 96     4 Minute HR 106     5 Minute HR 99     6 Minute HR 98     2 Minute Post HR 82     Interval Heart Rate? Yes           Interval Oxygen   Interval Oxygen? Yes     Baseline Oxygen Saturation % 96 %     1 Minute Oxygen Saturation % 96 %  1 Minute Liters of Oxygen 4 L     2 Minute Oxygen Saturation % 95 %     2 Minute Liters of Oxygen 4 L     3 Minute Oxygen Saturation % 93 %     3 Minute Liters of Oxygen 4 L     4 Minute Oxygen Saturation % 95 %     4 Minute Liters of Oxygen 4 L     5 Minute Oxygen Saturation % 94 %     5 Minute Liters of Oxygen 4 L     6 Minute Oxygen Saturation % 94 %     6 Minute Liters of Oxygen 4 L     2 Minute Post Oxygen Saturation % 97 %     2 Minute Post Liters of Oxygen 4 L              Oxygen Initial Assessment:  Oxygen Initial Assessment - 06/30/21 1308       Home Oxygen   Home Oxygen Device Home Concentrator;E-Tanks    Sleep Oxygen Prescription BiPAP    Liters per minute 3    Home Resting Oxygen Prescription Continuous    Liters per minute 3      Initial 6 min Walk   Oxygen Used Continuous    Liters per minute 4      Intervention   Short Term Goals To learn and exhibit compliance with exercise, home and travel O2 prescription;To learn and understand importance of monitoring SPO2 with pulse oximeter and demonstrate accurate use of  the pulse oximeter.;To learn and understand importance of maintaining oxygen saturations>88%;To learn and demonstrate proper pursed lip breathing techniques or other breathing techniques.     Long  Term Goals Exhibits compliance with exercise, home  and travel O2 prescription;Verbalizes importance of monitoring SPO2 with pulse oximeter and return demonstration;Maintenance of O2 saturations>88%;Exhibits proper breathing techniques, such as pursed lip breathing or other method taught during program session             Oxygen Re-Evaluation:  Oxygen Re-Evaluation     Row Name 07/13/21 1437 08/10/21 1643           Program Oxygen Prescription   Program Oxygen Prescription Continuous;E-Tanks Continuous;E-Tanks      Liters per minute 4 4             Home Oxygen   Home Oxygen Device Home Concentrator;E-Tanks Home Concentrator;E-Tanks      Sleep Oxygen Prescription BiPAP BiPAP      Liters per minute 3 3      Home Exercise Oxygen Prescription Continuous Continuous      Liters per minute 3 3      Home Resting Oxygen Prescription Continuous Continuous      Liters per minute 3 3      Compliance with Home Oxygen Use Yes Yes             Goals/Expected Outcomes   Short Term Goals To learn and exhibit compliance with exercise, home and travel O2 prescription;To learn and understand importance of monitoring SPO2 with pulse oximeter and demonstrate accurate use of the pulse oximeter.;To learn and understand importance of maintaining oxygen saturations>88%;To learn and demonstrate proper pursed lip breathing techniques or other breathing techniques.  To learn and exhibit compliance with exercise, home and travel O2 prescription;To learn and understand importance of monitoring SPO2 with pulse oximeter and demonstrate accurate use of the pulse oximeter.;To learn and understand importance of maintaining oxygen saturations>88%;To learn  and demonstrate proper pursed lip breathing techniques or other  breathing techniques.       Long  Term Goals Exhibits compliance with exercise, home  and travel O2 prescription;Verbalizes importance of monitoring SPO2 with pulse oximeter and return demonstration;Maintenance of O2 saturations>88%;Exhibits proper breathing techniques, such as pursed lip breathing or other method taught during program session Exhibits compliance with exercise, home  and travel O2 prescription;Verbalizes importance of monitoring SPO2 with pulse oximeter and return demonstration;Maintenance of O2 saturations>88%;Exhibits proper breathing techniques, such as pursed lip breathing or other method taught during program session      Goals/Expected Outcomes compliance compliance               Oxygen Discharge (Final Oxygen Re-Evaluation):  Oxygen Re-Evaluation - 08/10/21 1643       Program Oxygen Prescription   Program Oxygen Prescription Continuous;E-Tanks    Liters per minute 4      Home Oxygen   Home Oxygen Device Home Concentrator;E-Tanks    Sleep Oxygen Prescription BiPAP    Liters per minute 3    Home Exercise Oxygen Prescription Continuous    Liters per minute 3    Home Resting Oxygen Prescription Continuous    Liters per minute 3    Compliance with Home Oxygen Use Yes      Goals/Expected Outcomes   Short Term Goals To learn and exhibit compliance with exercise, home and travel O2 prescription;To learn and understand importance of monitoring SPO2 with pulse oximeter and demonstrate accurate use of the pulse oximeter.;To learn and understand importance of maintaining oxygen saturations>88%;To learn and demonstrate proper pursed lip breathing techniques or other breathing techniques.     Long  Term Goals Exhibits compliance with exercise, home  and travel O2 prescription;Verbalizes importance of monitoring SPO2 with pulse oximeter and return demonstration;Maintenance of O2 saturations>88%;Exhibits proper breathing techniques, such as pursed lip breathing or other method  taught during program session    Goals/Expected Outcomes compliance             Initial Exercise Prescription:  Initial Exercise Prescription - 06/30/21 1400       Date of Initial Exercise RX and Referring Provider   Date 06/30/21    Referring Provider Dr. Lula Olszewski    Expected Discharge Date 10/28/21      Oxygen   Oxygen Continuous    Liters 4      NuStep   Level 1    SPM 60    Minutes 39      Prescription Details   Frequency (times per week) 2    Duration Progress to 30 minutes of continuous aerobic without signs/symptoms of physical distress      Intensity   THRR 40-80% of Max Heartrate 60-120    Ratings of Perceived Exertion 11-13    Perceived Dyspnea 0-4      Resistance Training   Training Prescription Yes    Weight 3 lbs    Reps 10-15             Perform Capillary Blood Glucose checks as needed.  Exercise Prescription Changes:   Exercise Prescription Changes     Row Name 07/13/21 1400 07/22/21 1415 08/05/21 1330         Response to Exercise   Blood Pressure (Admit) 158/68 138/70 138/68     Blood Pressure (Exercise) 148/66 142/72 160/72     Blood Pressure (Exit) 118/62 100/70 132/68     Heart Rate (Admit) 92 bpm 62 bpm 91 bpm  Heart Rate (Exercise) 104 bpm 93 bpm 112 bpm     Heart Rate (Exit) 101 bpm 93 bpm 100 bpm     Oxygen Saturation (Admit) 95 % 97 % 98 %     Oxygen Saturation (Exercise) 93 % 95 % 96 %     Oxygen Saturation (Exit) 94 % 96 % 95 %     Rating of Perceived Exertion (Exercise) '12 11 12     '$ Perceived Dyspnea (Exercise) '12 11 12     '$ Duration Continue with 30 min of aerobic exercise without signs/symptoms of physical distress. Continue with 30 min of aerobic exercise without signs/symptoms of physical distress. Continue with 30 min of aerobic exercise without signs/symptoms of physical distress.     Intensity THRR unchanged THRR unchanged THRR unchanged           Progression   Progression Continue to progress workloads to  maintain intensity without signs/symptoms of physical distress. Continue to progress workloads to maintain intensity without signs/symptoms of physical distress. Continue to progress workloads to maintain intensity without signs/symptoms of physical distress.           Resistance Training   Training Prescription Yes Yes Yes     Weight 4 lbs 4 lbs 5 lbs     Reps 10-15 10-15 10-15     Time 10 Minutes 10 Minutes 10 Minutes           Oxygen   Oxygen Continuous Continuous Continuous     Liters '4 4 4           '$ NuStep   Level '1 3 2     '$ SPM 89 70 82     Minutes 39 39 39     METs '2 2 2              '$ Exercise Comments:   Exercise Goals and Review:   Exercise Goals     Row Name 06/30/21 1438 07/13/21 1440 08/10/21 1645         Exercise Goals   Increase Physical Activity Yes Yes Yes     Intervention Provide advice, education, support and counseling about physical activity/exercise needs.;Develop an individualized exercise prescription for aerobic and resistive training based on initial evaluation findings, risk stratification, comorbidities and participant's personal goals. Provide advice, education, support and counseling about physical activity/exercise needs.;Develop an individualized exercise prescription for aerobic and resistive training based on initial evaluation findings, risk stratification, comorbidities and participant's personal goals. Provide advice, education, support and counseling about physical activity/exercise needs.;Develop an individualized exercise prescription for aerobic and resistive training based on initial evaluation findings, risk stratification, comorbidities and participant's personal goals.     Expected Outcomes Short Term: Attend rehab on a regular basis to increase amount of physical activity.;Long Term: Add in home exercise to make exercise part of routine and to increase amount of physical activity.;Long Term: Exercising regularly at least 3-5 days a  week. Short Term: Attend rehab on a regular basis to increase amount of physical activity.;Long Term: Add in home exercise to make exercise part of routine and to increase amount of physical activity.;Long Term: Exercising regularly at least 3-5 days a week. Short Term: Attend rehab on a regular basis to increase amount of physical activity.;Long Term: Add in home exercise to make exercise part of routine and to increase amount of physical activity.;Long Term: Exercising regularly at least 3-5 days a week.     Increase Strength and Stamina Yes Yes Yes     Intervention  Provide advice, education, support and counseling about physical activity/exercise needs.;Develop an individualized exercise prescription for aerobic and resistive training based on initial evaluation findings, risk stratification, comorbidities and participant's personal goals. Provide advice, education, support and counseling about physical activity/exercise needs.;Develop an individualized exercise prescription for aerobic and resistive training based on initial evaluation findings, risk stratification, comorbidities and participant's personal goals. Provide advice, education, support and counseling about physical activity/exercise needs.;Develop an individualized exercise prescription for aerobic and resistive training based on initial evaluation findings, risk stratification, comorbidities and participant's personal goals.     Expected Outcomes Short Term: Increase workloads from initial exercise prescription for resistance, speed, and METs.;Short Term: Perform resistance training exercises routinely during rehab and add in resistance training at home;Long Term: Improve cardiorespiratory fitness, muscular endurance and strength as measured by increased METs and functional capacity (6MWT) Short Term: Increase workloads from initial exercise prescription for resistance, speed, and METs.;Short Term: Perform resistance training exercises routinely  during rehab and add in resistance training at home;Long Term: Improve cardiorespiratory fitness, muscular endurance and strength as measured by increased METs and functional capacity (6MWT) Short Term: Increase workloads from initial exercise prescription for resistance, speed, and METs.;Short Term: Perform resistance training exercises routinely during rehab and add in resistance training at home;Long Term: Improve cardiorespiratory fitness, muscular endurance and strength as measured by increased METs and functional capacity (6MWT)     Able to understand and use rate of perceived exertion (RPE) scale Yes Yes Yes     Intervention Provide education and explanation on how to use RPE scale Provide education and explanation on how to use RPE scale Provide education and explanation on how to use RPE scale     Expected Outcomes Short Term: Able to use RPE daily in rehab to express subjective intensity level;Long Term:  Able to use RPE to guide intensity level when exercising independently Short Term: Able to use RPE daily in rehab to express subjective intensity level;Long Term:  Able to use RPE to guide intensity level when exercising independently Short Term: Able to use RPE daily in rehab to express subjective intensity level;Long Term:  Able to use RPE to guide intensity level when exercising independently     Able to understand and use Dyspnea scale Yes Yes Yes     Intervention Provide education and explanation on how to use Dyspnea scale Provide education and explanation on how to use Dyspnea scale Provide education and explanation on how to use Dyspnea scale     Expected Outcomes Short Term: Able to use Dyspnea scale daily in rehab to express subjective sense of shortness of breath during exertion;Long Term: Able to use Dyspnea scale to guide intensity level when exercising independently Short Term: Able to use Dyspnea scale daily in rehab to express subjective sense of shortness of breath during  exertion;Long Term: Able to use Dyspnea scale to guide intensity level when exercising independently Short Term: Able to use Dyspnea scale daily in rehab to express subjective sense of shortness of breath during exertion;Long Term: Able to use Dyspnea scale to guide intensity level when exercising independently     Knowledge and understanding of Target Heart Rate Range (THRR) Yes Yes Yes     Intervention Provide education and explanation of THRR including how the numbers were predicted and where they are located for reference Provide education and explanation of THRR including how the numbers were predicted and where they are located for reference Provide education and explanation of THRR including how the numbers were  predicted and where they are located for reference     Expected Outcomes Short Term: Able to state/look up THRR;Short Term: Able to use daily as guideline for intensity in rehab;Long Term: Able to use THRR to govern intensity when exercising independently Short Term: Able to state/look up THRR;Short Term: Able to use daily as guideline for intensity in rehab;Long Term: Able to use THRR to govern intensity when exercising independently Short Term: Able to state/look up THRR;Short Term: Able to use daily as guideline for intensity in rehab;Long Term: Able to use THRR to govern intensity when exercising independently     Understanding of Exercise Prescription Yes Yes Yes     Intervention Provide education, explanation, and written materials on patient's individual exercise prescription Provide education, explanation, and written materials on patient's individual exercise prescription Provide education, explanation, and written materials on patient's individual exercise prescription     Expected Outcomes Short Term: Able to explain program exercise prescription;Long Term: Able to explain home exercise prescription to exercise independently Short Term: Able to explain program exercise  prescription;Long Term: Able to explain home exercise prescription to exercise independently Short Term: Able to explain program exercise prescription;Long Term: Able to explain home exercise prescription to exercise independently              Exercise Goals Re-Evaluation :  Exercise Goals Re-Evaluation     [place] Name 07/13/21 1440 08/10/21 1645           Exercise Goal Re-Evaluation   Exercise Goals Review Increase Physical Activity;Increase Strength and Stamina;Able to understand and use rate of perceived exertion (RPE) scale;Knowledge and understanding of Target Heart Rate Range (THRR);Able to check pulse independently;Understanding of Exercise Prescription Increase Physical Activity;Increase Strength and Stamina;Able to understand and use rate of perceived exertion (RPE) scale;Knowledge and understanding of Target Heart Rate Range (THRR);Able to check pulse independently;Understanding of Exercise Prescription      Comments Pt has completed 3 exercise sessions. He is deconditioned and limited by right hip pain, but he does give good effort while he is in rehab. He is currently exercising at 2.0 METs on the stepper. Will continue to monitor and progress as able. Pt has completed 8 exercise sessions. He has made some progress but has had to miss several sessions due to other appointments and health problems. He is currently exercising at 2.0 METs on the stepper. Will continue to monitor and progress as able.      Expected Outcomes Through exercise at rehab and at home, the patient will meet their stated goals. Through exercise at rehab and at home, the patient will meet their stated goals.               Discharge Exercise Prescription (Final Exercise Prescription Changes):  Exercise Prescription Changes - 08/05/21 1330       Response to Exercise   Blood Pressure (Admit) 138/68    Blood Pressure (Exercise) 160/72    Blood Pressure (Exit) 132/68    Heart Rate (Admit) 91 bpm    Heart  Rate (Exercise) 112 bpm    Heart Rate (Exit) 100 bpm    Oxygen Saturation (Admit) 98 %    Oxygen Saturation (Exercise) 96 %    Oxygen Saturation (Exit) 95 %    Rating of Perceived Exertion (Exercise) 12    Perceived Dyspnea (Exercise) 12    Duration Continue with 30 min of aerobic exercise without signs/symptoms of physical distress.    Intensity THRR unchanged      Progression  Progression Continue to progress workloads to maintain intensity without signs/symptoms of physical distress.      Resistance Training   Training Prescription Yes    Weight 5 lbs    Reps 10-15    Time 10 Minutes      Oxygen   Oxygen Continuous    Liters 4      NuStep   Level 2    SPM 82    Minutes 39    METs 2             Nutrition:  Target Goals: Understanding of nutrition guidelines, daily intake of sodium '1500mg'$ , cholesterol '200mg'$ , calories 30% from fat and 7% or less from saturated fats, daily to have 5 or more servings of fruits and vegetables.  Biometrics:  Pre Biometrics - 06/30/21 1439       Pre Biometrics   Height '5\' 8"'$  (1.727 m)    Weight 126.7 kg    Waist Circumference 55 inches    Hip Circumference 51 inches    Waist to Hip Ratio 1.08 %    BMI (Calculated) 42.48    Triceps Skinfold 7 mm    % Body Fat 37.3 %    Grip Strength 28 kg    Flexibility 0 in    Single Leg Stand 0 seconds              Nutrition Therapy Plan and Nutrition Goals:  Nutrition Therapy & Goals - 06/30/21 1410       Personal Nutrition Goals   Comments Patient scored 127 on his diet assessment. Score discussed with patient and his wife. They both state that he is not interested in changing his diet. Handout provided regarding DM control and making healthier choices. We provide 2 educational sessions regarding heart heathy nutrition with handouts and assistance with RD referral.      Intervention Plan   Intervention Nutrition handout(s) given to patient.             Nutrition  Assessments:  Nutrition Assessments - 06/30/21 1414       MEDFICTS Scores   Pre Score 127            MEDIFICTS Score Key: ?70 Need to make dietary changes  40-70 Heart Healthy Diet ? 40 Therapeutic Level Cholesterol Diet   Picture Your Plate Scores: D34-534 Unhealthy dietary pattern with much room for improvement. 41-50 Dietary pattern unlikely to meet recommendations for good health and room for improvement. 51-60 More healthful dietary pattern, with some room for improvement.  >60 Healthy dietary pattern, although there may be some specific behaviors that could be improved.    Nutrition Goals Re-Evaluation:   Nutrition Goals Discharge (Final Nutrition Goals Re-Evaluation):   Psychosocial: Target Goals: Acknowledge presence or absence of significant depression and/or stress, maximize coping skills, provide positive support system. Participant is able to verbalize types and ability to use techniques and skills needed for reducing stress and depression.  Initial Review & Psychosocial Screening:  Initial Psych Review & Screening - 06/30/21 1448       Initial Review   Current issues with None Identified      Family Dynamics   Good Support System? Yes      Barriers   Psychosocial barriers to participate in program Psychosocial barriers identified (see note)      Screening Interventions   Interventions Encouraged to exercise;Program counselor consult;Provide feedback about the scores to participant;To provide support and resources with identified psychosocial needs    Expected  Outcomes Short Term goal: Utilizing psychosocial counselor, staff and physician to assist with identification of specific Stressors or current issues interfering with healing process. Setting desired goal for each stressor or current issue identified.;Long Term Goal: Stressors or current issues are controlled or eliminated.;Short Term goal: Identification and review with participant of any Quality of  Life or Depression concerns found by scoring the questionnaire.             Quality of Life Scores:  Quality of Life - 06/30/21 1434       Quality of Life   Select Quality of Life      Quality of Life Scores   Health/Function Pre 12.66 %    Socioeconomic Pre 11.8 %    Psych/Spiritual Pre 11.79 %    Family Pre 12.4 %    GLOBAL Pre 12.3 %            Scores of 19 and below usually indicate a poorer quality of life in these areas.  A difference of  2-3 points is a clinically meaningful difference.  A difference of 2-3 points in the total score of the Quality of Life Index has been associated with significant improvement in overall quality of life, self-image, physical symptoms, and general health in studies assessing change in quality of life.   PHQ-9: Recent Review Flowsheet Data     Depression screen [facility] 2/9 06/30/2021 12/31/2018 09/28/2018 08/07/2018   Decreased Interest 0 0 0 1   Down, Depressed, Hopeless 0 0 0 2   PHQ - 2 Score 0 0 0 3   Altered sleeping 0 0 0 2   Tired, decreased energy 0 '1 1 2   '$ Change in appetite 1 0 0 0   Feeling bad or failure about yourself  0 0 0 0   Trouble concentrating 0 0 0 0   Moving slowly or fidgety/restless 0 0 0 0   Suicidal thoughts 0 0 0 0   PHQ-9 Score '1 1 1 7   '$ Difficult doing work/chores Not difficult at all Not difficult at all Not difficult at all Very difficult      Interpretation of Total Score  Total Score Depression Severity:  1-4 = Minimal depression, 5-9 = Mild depression, 10-14 = Moderate depression, 15-19 = Moderately severe depression, 20-27 = Severe depression   Psychosocial Evaluation and Intervention:  Psychosocial Evaluation - 06/30/21 1449       Psychosocial Evaluation & Interventions   Interventions Encouraged to exercise with the program and follow exercise prescription;Stress management education;Relaxation education    Comments Patient has no psychosocial barriers identified to participate in PR. His  initial PHQ-9 score was 1 and his QOL score 12.3% overall scoreing 11 and 12% in all categories. He denies any current or history of depression, anxiety, or stress. His wife is present with him today whom he says is his emotional support person. They both have children of their own but none together. He has 3 sons that all live out-of-state and do not contact him often. She has 2 daughters that live near and he sees and talks to them often. He has polyarthritis which limits his mobility along with COPD that prevents him from doing activities. His long term goal is to get off of oxygen. For the program, he would like for his SOB to improve and to get stronger to be able to do more activities. He scored 127 on his diet assessment and stated he was not interested in changing his  diet and that he "loves to eat". Difficult to measure his motivation to improve his health overall but he did demonstate an interest in participating in the program to hopefully meet his goals.    Expected Outcomes Patient will continue to have no psychosocial barriers identififed.    Continue Psychosocial Services  No Follow up required             Psychosocial Re-Evaluation:  Psychosocial Re-Evaluation     Benedict Name 07/07/21 1150 08/05/21 1500           Psychosocial Re-Evaluation   Current issues with None Identified None Identified      Comments Patient is new to the program. He continues to have no psychosocial barriers identified. He has completed 1 session. We will continue to montior his progress. Patient continues to have no psychosocial barriers identified. He has completed 8 sessions and seems to enjoy coming to the program. He is very interactive with staff and other patients in his class. He demonstrates a positive attitude and an interest in improving his health. We will continue to monitor.      Expected Outcomes Patient will continue to have no psychosocial barriers identified. Patient will continue to have no  psychosocial barriers identified.      Interventions Stress management education;Encouraged to attend Pulmonary Rehabilitation for the exercise;Relaxation education Stress management education;Encouraged to attend Pulmonary Rehabilitation for the exercise;Relaxation education      Continue Psychosocial Services  No Follow up required No Follow up required               Psychosocial Discharge (Final Psychosocial Re-Evaluation):  Psychosocial Re-Evaluation - 08/05/21 1500       Psychosocial Re-Evaluation   Current issues with None Identified    Comments Patient continues to have no psychosocial barriers identified. He has completed 8 sessions and seems to enjoy coming to the program. He is very interactive with staff and other patients in his class. He demonstrates a positive attitude and an interest in improving his health. We will continue to monitor.    Expected Outcomes Patient will continue to have no psychosocial barriers identified.    Interventions Stress management education;Encouraged to attend Pulmonary Rehabilitation for the exercise;Relaxation education    Continue Psychosocial Services  No Follow up required              Education: Education Goals: Education classes will be provided on a weekly basis, covering required topics. Participant will state understanding/return demonstration of topics presented.  Learning Barriers/Preferences:  Learning Barriers/Preferences - 06/30/21 1414       Learning Barriers/Preferences   Learning Barriers Hearing    Learning Preferences Individual Instruction;Skilled Demonstration             Education Topics: How Lungs Work and Diseases: - Discuss the anatomy of the lungs and diseases that can affect the lungs, such as COPD.   Exercise: -Discuss the importance of exercise, FITT principles of exercise, normal and abnormal responses to exercise, and how to exercise safely.   Environmental Irritants: -Discuss types of  environmental irritants and how to limit exposure to environmental irritants.   Meds/Inhalers and oxygen: - Discuss respiratory medications, definition of an inhaler and oxygen, and the proper way to use an inhaler and oxygen.   Energy Saving Techniques: - Discuss methods to conserve energy and decrease shortness of breath when performing activities of daily living.    Bronchial Hygiene / Breathing Techniques: - Discuss breathing mechanics, pursed-lip breathing technique,  proper  posture, effective ways to clear airways, and other functional breathing techniques   Cleaning Equipment: - Provides group verbal and written instruction about the health risks of elevated stress, cause of high stress, and healthy ways to reduce stress. Flowsheet Row PULMONARY REHAB CHRONIC OBSTRUCTIVE PULMONARY DISEASE from 08/05/2021 in [place]  Date 07/08/21  Educator DF  Instruction Review Code 2- Demonstrated Understanding       Nutrition I: Fats: - Discuss the types of cholesterol, what cholesterol does to the body, and how cholesterol levels can be controlled.   Nutrition II: Labels: -Discuss the different components of food labels and how to read food labels. Flowsheet Row PULMONARY REHAB CHRONIC OBSTRUCTIVE PULMONARY DISEASE from 08/05/2021 in [place]  Date 07/22/21  Educator pb  Instruction Review Code 1- Verbalizes Understanding       Respiratory Infections: - Discuss the signs and symptoms of respiratory infections, ways to prevent respiratory infections, and the importance of seeking medical treatment when having a respiratory infection. Flowsheet Row PULMONARY REHAB CHRONIC OBSTRUCTIVE PULMONARY DISEASE from 08/05/2021 in [place]  Date 07/29/21  Educator DJ  Instruction Review Code 1- Verbalizes Understanding       Stress I: Signs and Symptoms: - Discuss the causes of stress, how stress may lead to anxiety  and depression, and ways to limit stress. Flowsheet Row PULMONARY REHAB CHRONIC OBSTRUCTIVE PULMONARY DISEASE from 08/05/2021 in [place]  Date 08/05/21  Educator pb  Instruction Review Code 1- Verbalizes Understanding       Stress II: Relaxation: -Discuss relaxation techniques to limit stress.   Oxygen for Home/Travel: - Discuss how to prepare for travel when on oxygen and proper ways to transport and store oxygen to ensure safety.   Knowledge Questionnaire Score:  Knowledge Questionnaire Score - 06/30/21 1448       Knowledge Questionnaire Score   Pre Score 15/18             Core Components/Risk Factors/Patient Goals at Admission:  Personal Goals and Risk Factors at Admission - 06/30/21 1416       Core Components/Risk Factors/Patient Goals on Admission    Weight Management Obesity    Improve shortness of breath with ADL's Yes    Intervention Provide education, individualized exercise plan and daily activity instruction to help decrease symptoms of SOB with activities of daily living.    Expected Outcomes Short Term: Improve cardiorespiratory fitness to achieve a reduction of symptoms when performing ADLs;Long Term: Be able to perform more ADLs without symptoms or delay the onset of symptoms    Diabetes Yes    Intervention Provide education about signs/symptoms and action to take for hypo/hyperglycemia.;Provide education about proper nutrition, including hydration, and aerobic/resistive exercise prescription along with prescribed medications to achieve blood glucose in normal ranges: Fasting glucose 65-99 mg/dL    Expected Outcomes Short Term: Participant verbalizes understanding of the signs/symptoms and immediate care of hyper/hypoglycemia, proper foot care and importance of medication, aerobic/resistive exercise and nutrition plan for blood glucose control.    Personal Goal Other Yes    Personal Goal Improve SOB. Get stronger and strenghten lungs  to be able to do more activities.    Intervention Patient will attend PR 2 days/week and supplement with exercise 3 days/week.    Expected Outcomes Patient will complete the program and meet both personal and program.             Core Components/Risk Factors/Patient Goals Review:  Goals and Risk Factor Review     Row Name 07/07/21 1152 08/05/21 1501           Core Components/Risk Factors/Patient Goals Review   Personal Goals Review Weight Management/Obesity;Improve shortness of breath with ADL's;Diabetes;Hypertension;Stress Weight Management/Obesity;Improve shortness of breath with ADL's;Diabetes;Hypertension;Stress      Review Patient was referred to PR with COPD. He has completed 1 session. His goals for the program are to improve his SOB; get stronger and be able to do more activities. We will continue to monitor his progress as he works towards meeting these goals. Patient has completed 8 sessions gaining 1 KG since his initial visit. He is doing well in the program with consistent attendance. He exercises on 4L O2 saturating at 94-96%. His blood pressure is above goal at times. No A1C on file and he does not always report his glucose readings. 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 His personal goals for the program are to improve his SOB; get stronger to be able to do more activities. We will continue to monitor his progress as he works towards meeting these goals.      Expected Outcomes Patient will complete the program meeting both program and personal goals. Patient will complete the program meeting both program and personal goals.               Core Components/Risk Factors/Patient Goals at Discharge (Final Review):   Goals and Risk Factor Review - 08/05/21 1501       Core Components/Risk Factors/Patient Goals Review   Personal Goals Review Weight Management/Obesity;Improve shortness of breath with ADL's;Diabetes;Hypertension;Stress    Review Patient  has completed 8 sessions gaining 1 KG since his initial visit. He is doing well in the program with consistent attendance. He exercises on 4L O2 saturating at 94-96%. His blood pressure is above goal at times. No A1C on file and he does not always report his glucose readings. 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 His personal goals for the program are to improve his SOB; get stronger to be able to do more activities. We will continue to monitor his progress as he works towards meeting these goals.    Expected Outcomes Patient will complete the program meeting both program and personal goals.             ITP Comments:   Comments: ITP REVIEW Pt is making expected progress toward pulmonary rehab goals after completing 9 sessions. Recommend continued exercise, life style modification, education, and utilization of breathing techniques to increase stamina and strength and decrease shortness of breath with exertion.

## 2021-08-12 ENCOUNTER — Encounter (HOSPITAL_COMMUNITY): Payer: No Typology Code available for payment source

## 2021-08-17 ENCOUNTER — Ambulatory Visit (INDEPENDENT_AMBULATORY_CARE_PROVIDER_SITE_OTHER): Payer: Medicare Other | Admitting: Podiatry

## 2021-08-17 ENCOUNTER — Encounter (HOSPITAL_COMMUNITY)
Admission: RE | Admit: 2021-08-17 | Discharge: 2021-08-17 | Disposition: A | Discharge status: Home / Self Care | Payer: No Typology Code available for payment source | Source: Ambulatory Visit | Attending: *Deleted | Admitting: *Deleted

## 2021-08-17 ENCOUNTER — Other Ambulatory Visit: Payer: Self-pay

## 2021-08-17 DIAGNOSIS — M7752 Other enthesopathy of left foot: Secondary | ICD-10-CM

## 2021-08-17 DIAGNOSIS — M79674 Pain in right toe(s): Secondary | ICD-10-CM

## 2021-08-17 DIAGNOSIS — E1142 Type 2 diabetes mellitus with diabetic polyneuropathy: Secondary | ICD-10-CM | POA: Diagnosis not present

## 2021-08-17 DIAGNOSIS — M7751 Other enthesopathy of right foot: Secondary | ICD-10-CM

## 2021-08-17 DIAGNOSIS — M79675 Pain in left toe(s): Secondary | ICD-10-CM | POA: Diagnosis not present

## 2021-08-17 DIAGNOSIS — J449 Chronic obstructive pulmonary disease, unspecified: Secondary | ICD-10-CM

## 2021-08-17 DIAGNOSIS — B351 Tinea unguium: Secondary | ICD-10-CM

## 2021-08-17 DIAGNOSIS — Z794 Long term (current) use of insulin: Secondary | ICD-10-CM | POA: Diagnosis not present

## 2021-08-17 NOTE — Progress Notes (Signed)
Daily Session Note  Patient Details  Name: Ricky Johnston Sr. MRN: [id number] Date of Birth: 07-Jul-1951 Referring Provider:   Flowsheet Row PULMONARY REHAB COPD ORIENTATION from 06/30/2021 in [place]  Referring Provider Dr. Lula Olszewski       Encounter Date: 08/17/2021  Check In:  Session Check In - 08/17/21 1330       Check-In   Supervising physician immediately available to respond to emergencies [facility] MD immediately available    Physician(s) Dr. Domenic Polite    Location [facility]    Staff Present Geanie Cooley, RN;Heather Otho Ket, BS, Exercise Physiologist;Dalton Kris Mouton, MS, ACSM-CEP, Exercise Physiologist    Virtual Visit No    Medication changes reported     No    Fall or balance concerns reported    No    Comments using rollator for balance    Tobacco Cessation No Change    Warm-up and Cool-down Performed as group-led instruction    Resistance Training Performed Yes    VAD Patient? No    PAD/SET Patient? No      Pain Assessment   Currently in Pain? Yes    Pain Score 7     Pain Location Foot    Pain Orientation Right;Left    Pain Descriptors / Indicators Constant    Pain Type Chronic pain    Pain Onset More than a month ago    Pain Frequency Constant    Multiple Pain Sites No             Capillary Blood Glucose: No results found for this or any previous visit (from the past 24 hour(s)).    Social History   Tobacco Use  Smoking Status Former   Packs/day: 1.00   Years: 33.00   Pack years: 33.00   Types: Cigarettes   Quit date: 04/06/2000   Years since quitting: 21.3  Smokeless Tobacco Never  Tobacco Comments   use to chew cigars    Goals Met:  Proper associated with RPD/PD & O2 Sat Independence with exercise equipment Using PLB without cueing & demonstrates good technique Exercise tolerated well No report of concerns or symptoms today Strength training completed today  Goals Unmet:  Not  Applicable  Comments: check out @ 2:30pm   Dr. Kathie Dike is Medical Director for [facility].

## 2021-08-17 NOTE — Progress Notes (Signed)
  Subjective:  Patient ID: Ricky Johnston., male    DOB: 06-Mar-1951,  MRN: [id number]  Chief Complaint  Patient presents with   Nail Problem    Nail trim     70 y.o. male returns for the above complaint.  Patient presents with thickened elongated dystrophic toenails x10.  They are painful to touch.  Patient is a diabetic with last A1c of 8.  He would like to have them debrided down his not able to do himself.  He also has secondary complaint of pain to the first MPJ bilaterally.  Pain with range of motion of the joint.  He states that the pain started after a gout flare.  He states he does have intake and red meat but does not do any alcohol.  He denies any other acute complaints he would like to know if he can get a steroid injection.  He has been taking colchicine.  Objective:  There were no vitals filed for this visit. Podiatric Exam: Vascular: dorsalis pedis and posterior tibial pulses are palpable bilateral. Capillary return is immediate. Temperature gradient is WNL. Skin turgor WNL  Sensorium: Decreased Semmes Weinstein monofilament test.  Decreased tactile sensation bilaterally. Nail Exam: Pt has thick disfigured discolored nails with subungual debris noted bilateral entire nail hallux through fifth toenails.  Pain on palpation to the nails. Ulcer Exam: There is no evidence of ulcer or pre-ulcerative changes or infection. Orthopedic Exam: Muscle tone and strength are WNL. No limitations in general ROM. No crepitus or effusions noted. HAV  B/L.  Hammer toes 2-5  B/L no further pain at the Ankle joint noted.  Pain with range of motion first MPJ.  Pain on palpation of first MPJ bilaterally.  Mild redness noted. Skin: No Porokeratosis. No infection or ulcers    Assessment & Plan:   1. Capsulitis of metatarsophalangeal (MTP) joint of right foot   2. Capsulitis of metatarsophalangeal (MTP) joint of left foot   3. Pain due to onychomycosis of toenails of both feet   4. Type 2 diabetes  mellitus with diabetic polyneuropathy, with long-term current use of insulin ([place])       Patient was evaluated and treated and all questions answered.  Bilateral first MPJ capsulitis -I explained the patient the etiology of capsulitis and various treatment options were discussed.  Patient has a history of gout and had a gout flare that could be likely causing a lot of pain in the first MPJ joint.  I discussed diet control as well as benefit of steroid injection.  Patient states understand like to proceed with steroid injection -A steroid injection was performed at Bilateral first MPJ using 1% plain Lidocaine and 10 mg of Kenalog. This was well tolerated.   Right ankle joint capsulitis -Clinically doing better.   Onychomycosis with pain  -Nails palliatively debrided as below. -Educated on self-care  Procedure: Nail Debridement Rationale: pain  Type of Debridement: manual, sharp debridement. Instrumentation: Nail nipper, rotary burr. Number of Nails: 10  Procedures and Treatment: Consent by patient was obtained for treatment procedures. The patient understood the discussion of treatment and procedures well. All questions were answered thoroughly reviewed. Debridement of mycotic and hypertrophic toenails, 1 through 5 bilateral and clearing of subungual debris. No ulceration, no infection noted.  Return Visit-Office Procedure: Patient instructed to return to the office for a follow up visit 3 months for continued evaluation and treatment.  Boneta Lucks, DPM    No follow-ups on file.

## 2021-08-19 ENCOUNTER — Other Ambulatory Visit: Payer: Self-pay

## 2021-08-19 ENCOUNTER — Encounter (HOSPITAL_COMMUNITY)
Admission: RE | Admit: 2021-08-19 | Discharge: 2021-08-19 | Disposition: A | Discharge status: Home / Self Care | Payer: No Typology Code available for payment source | Source: Ambulatory Visit | Attending: *Deleted | Admitting: *Deleted

## 2021-08-19 DIAGNOSIS — J449 Chronic obstructive pulmonary disease, unspecified: Secondary | ICD-10-CM

## 2021-08-19 NOTE — Progress Notes (Signed)
Daily Session Note  Patient Details  Name: Ricky WILLIARD Sr. MRN: [id number] Date of Birth: 1951-05-23 Referring Provider:   Flowsheet Row PULMONARY REHAB COPD ORIENTATION from 06/30/2021 in [place]  Referring Provider Dr. Lula Olszewski       Encounter Date: 08/19/2021  Check In:  Session Check In - 08/19/21 1330       Check-In   Supervising physician immediately available to respond to emergencies [facility] MD immediately available    Physician(s) Dr. Harl Bowie    Location [facility]    Staff Present Geanie Cooley, RN;Janece Laidlaw Wynetta Emery, RN, BSN;Heather Otho Ket, BS, Exercise Physiologist    Virtual Visit No    Medication changes reported     No    Fall or balance concerns reported    Yes    Comments using rollator for balance    Tobacco Cessation No Change    Warm-up and Cool-down Performed as group-led instruction    Resistance Training Performed Yes    VAD Patient? No    PAD/SET Patient? No      Pain Assessment   Currently in Pain? No/denies    Pain Score 0-No pain    Multiple Pain Sites No             Capillary Blood Glucose: No results found for this or any previous visit (from the past 24 hour(s)).    Social History   Tobacco Use  Smoking Status Former   Packs/day: 1.00   Years: 33.00   Pack years: 33.00   Types: Cigarettes   Quit date: 04/06/2000   Years since quitting: 21.3  Smokeless Tobacco Never  Tobacco Comments   use to chew cigars    Goals Met:  Proper associated with RPD/PD & O2 Sat Independence with exercise equipment Using PLB without cueing & demonstrates good technique Exercise tolerated well No report of concerns or symptoms today Strength training completed today  Goals Unmet:  Not Applicable  Comments: Check out 1410.   Dr. Kathie Dike is Medical Director for [facility] Pulmonary Re[facility].

## 2021-08-24 ENCOUNTER — Other Ambulatory Visit: Payer: Self-pay

## 2021-08-24 ENCOUNTER — Encounter (HOSPITAL_COMMUNITY)
Admission: RE | Admit: 2021-08-24 | Discharge: 2021-08-24 | Disposition: A | Discharge status: Home / Self Care | Payer: No Typology Code available for payment source | Source: Ambulatory Visit | Attending: *Deleted | Admitting: *Deleted

## 2021-08-24 VITALS — Wt 279.5 lb

## 2021-08-24 DIAGNOSIS — J449 Chronic obstructive pulmonary disease, unspecified: Secondary | ICD-10-CM | POA: Diagnosis not present

## 2021-08-24 NOTE — Progress Notes (Signed)
Daily Session Note  Patient Details  Name: Ricky GALINDO Sr. MRN: [id number] Date of Birth: [birth year] Referring Provider:   Flowsheet Row PULMONARY REHAB COPD ORIENTATION from 06/30/2021 in [place]  Referring Provider Dr. Lula Olszewski       Encounter Date: 08/24/2021  Check In:  Session Check In - 08/24/21 1330       Check-In   Supervising physician immediately available to respond to emergencies [facility] immediately available    Physician(s) Dr. Domenic Polite    Location [facility]    Staff Present Geanie Cooley, RN;Heather Otho Ket, BS, Exercise Physiologist;Dalton Kris Mouton, MS, ACSM-CEP, Exercise Physiologist    Virtual Visit No    Medication changes reported     No    Fall or balance concerns reported    Yes    Comments using rollator for balance    Tobacco Cessation No Change    Warm-up and Cool-down Performed as group-led instruction    Resistance Training Performed Yes    VAD Patient? No    PAD/SET Patient? No      Pain Assessment   Currently in Pain? No/denies    Pain Score 0-No pain    Pain Location --    Pain Orientation --    Pain Descriptors / Indicators --    Pain Type --    Pain Onset --    Pain Frequency --    Multiple Pain Sites No             Capillary Blood Glucose: No results found for this or any previous visit (from the past 24 hour(s)).    Social History   Tobacco Use  Smoking Status Former   Packs/day: 1.00   Years: 33.00   Pack years: 33.00   Types: Cigarettes   Quit date: 04/06/2000   Years since quitting: 21.3  Smokeless Tobacco Never  Tobacco Comments   use to chew cigars    Goals Met:  Proper associated with RPD/PD & O2 Sat Independence with exercise equipment Using PLB without cueing & demonstrates good technique Exercise tolerated well No report of concerns or symptoms today Strength training completed today  Goals Unmet:  Not Applicable  Comments: check out @  2:30pm   Dr. Kathie Dike is Medical Director for [facility] Pulmonary Re[facility].

## 2021-08-26 ENCOUNTER — Encounter (HOSPITAL_COMMUNITY)
Admission: RE | Admit: 2021-08-26 | Discharge: 2021-08-26 | Disposition: A | Discharge status: Home / Self Care | Payer: No Typology Code available for payment source | Source: Ambulatory Visit | Attending: *Deleted | Admitting: *Deleted

## 2021-08-26 ENCOUNTER — Other Ambulatory Visit: Payer: Self-pay

## 2021-08-26 DIAGNOSIS — J449 Chronic obstructive pulmonary disease, unspecified: Secondary | ICD-10-CM

## 2021-08-26 NOTE — Progress Notes (Signed)
Daily Session Note  Patient Details  Name: Ricky VANAKEN Sr. MRN: [id number] Date of Birth: 1951/11/07 Referring Provider:   Flowsheet Row PULMONARY REHAB COPD ORIENTATION from 06/30/2021 in [place]  Referring Provider Dr. Lula Olszewski       Encounter Date: 08/26/2021  Check In:  Session Check In - 08/26/21 1330       Check-In   Supervising physician immediately available to respond to emergencies [facility] immediately available    Physician(s) Dr. Domenic Polite    Location [facility]    Staff Present Hoy Register, MS, ACSM-CEP, Exercise Physiologist;Heather Zigmund Daniel, Exercise Physiologist    Virtual Visit No    Medication changes reported     No    Fall or balance concerns reported    Yes    Comments using rollator for balance    Tobacco Cessation No Change    Warm-up and Cool-down Performed as group-led instruction    Resistance Training Performed Yes    VAD Patient? No    PAD/SET Patient? No      Pain Assessment   Currently in Pain? No/denies    Pain Score 0-No pain    Multiple Pain Sites No             Capillary Blood Glucose: No results found for this or any previous visit (from the past 24 hour(s)).    Social History   Tobacco Use  Smoking Status Former   Packs/day: 1.00   Years: 33.00   Pack years: 33.00   Types: Cigarettes   Quit date: 04/06/2000   Years since quitting: 21.4  Smokeless Tobacco Never  Tobacco Comments   use to chew cigars    Goals Met:  Independence with exercise equipment Exercise tolerated well No report of concerns or symptoms today Strength training completed today  Goals Unmet:  Not Applicable  Comments: checkout time is 1430   Dr. Kathie Dike is Medical Director for [facility] Pulmonary Rehab.

## 2021-08-31 ENCOUNTER — Encounter (HOSPITAL_COMMUNITY)
Admission: RE | Admit: 2021-08-31 | Discharge: 2021-08-31 | Disposition: A | Discharge status: Home / Self Care | Payer: No Typology Code available for payment source | Source: Ambulatory Visit | Attending: *Deleted | Admitting: *Deleted

## 2021-08-31 ENCOUNTER — Other Ambulatory Visit: Payer: Self-pay

## 2021-08-31 DIAGNOSIS — J449 Chronic obstructive pulmonary disease, unspecified: Secondary | ICD-10-CM | POA: Insufficient documentation

## 2021-08-31 NOTE — Progress Notes (Signed)
Daily Session Note  Patient Details  Name: Ricky NGUYEN Sr. MRN: [id number] Date of Birth: 1951/02/04 Referring Provider:   Flowsheet Row PULMONARY REHAB COPD ORIENTATION from 06/30/2021 in [place]  Referring Provider Dr. Lula Olszewski       Encounter Date: 08/31/2021  Check In:  Session Check In - 08/31/21 1330       Check-In   Supervising physician immediately available to respond to emergencies [facility] immediately available    Physician(s) Dr. Domenic Polite    Location [facility]    Staff Present Hoy Register, MS, ACSM-CEP, Exercise Physiologist;Heather Zigmund Daniel, Exercise Physiologist;Phyllis Billingsley, RN    Virtual Visit No    Medication changes reported     No    Fall or balance concerns reported    Yes    Tobacco Cessation No Change    Warm-up and Cool-down Performed as group-led instruction    Resistance Training Performed Yes    VAD Patient? No    PAD/SET Patient? No      Pain Assessment   Currently in Pain? No/denies    Pain Score 0-No pain    Multiple Pain Sites No             Capillary Blood Glucose: No results found for this or any previous visit (from the past 24 hour(s)).    Social History   Tobacco Use  Smoking Status Former   Packs/day: 1.00   Years: 33.00   Pack years: 33.00   Types: Cigarettes   Quit date: 04/06/2000   Years since quitting: 21.4  Smokeless Tobacco Never  Tobacco Comments   use to chew cigars    Goals Met:  Independence with exercise equipment Exercise tolerated well No report of concerns or symptoms today Strength training completed today  Goals Unmet:  Not Applicable  Comments: checkout time is 1430   Dr. Kathie Dike is Medical Director for [facility] Pulmonary Rehab.

## 2021-09-02 ENCOUNTER — Encounter (HOSPITAL_COMMUNITY)
Admission: RE | Admit: 2021-09-02 | Discharge: 2021-09-02 | Disposition: A | Discharge status: Home / Self Care | Payer: No Typology Code available for payment source | Source: Ambulatory Visit | Attending: *Deleted | Admitting: *Deleted

## 2021-09-02 ENCOUNTER — Other Ambulatory Visit: Payer: Self-pay

## 2021-09-02 DIAGNOSIS — J449 Chronic obstructive pulmonary disease, unspecified: Secondary | ICD-10-CM | POA: Diagnosis not present

## 2021-09-02 NOTE — Progress Notes (Signed)
Daily Session Note  Patient Details  Name: Ricky Johnston. MRN: [id number] Date of Birth: Oct 27, 1951 Referring Provider:   Flowsheet Row PULMONARY REHAB COPD ORIENTATION from 06/30/2021 in [place]  Referring Provider Dr. Lula Olszewski       Encounter Date: 09/02/2021  Check In:  Session Check In - 09/02/21 1330       Check-In   Supervising physician immediately available to respond to emergencies [facility] immediately available    Physician(s) Dr. Domenic Polite    Location [facility]    Staff Present Geanie Cooley, RN;Heather Otho Ket, BS, Exercise Physiologist;Debra Wynetta Emery, RN, BSN;Carlette Carlton, RN, BSN    Virtual Visit No    Medication changes reported     No    Fall or balance concerns reported    Yes    Comments using rollator for balance    Tobacco Cessation No Change    Warm-up and Cool-down Performed as group-led instruction    Resistance Training Performed Yes    VAD Patient? No    PAD/SET Patient? No      Pain Assessment   Currently in Pain? No/denies    Pain Score 0-No pain    Multiple Pain Sites No             Capillary Blood Glucose: No results found for this or any previous visit (from the past 24 hour(s)).    Social History   Tobacco Use  Smoking Status Former   Packs/day: 1.00   Years: 33.00   Pack years: 33.00   Types: Cigarettes   Quit date: 04/06/2000   Years since quitting: 21.4  Smokeless Tobacco Never  Tobacco Comments   use to chew cigars    Goals Met:  Proper associated with RPD/PD & O2 Sat Independence with exercise equipment Using PLB without cueing & demonstrates good technique Exercise tolerated well No report of concerns or symptoms today Strength training completed today  Goals Unmet:  Not Applicable  Comments: check out @ 2:30pm   Dr. Kathie Dike is Medical Director for [facility] Pulmonary Rehab.

## 2021-09-07 ENCOUNTER — Other Ambulatory Visit: Payer: Self-pay

## 2021-09-07 ENCOUNTER — Encounter (HOSPITAL_COMMUNITY)
Admission: RE | Admit: 2021-09-07 | Discharge: 2021-09-07 | Disposition: A | Discharge status: Home / Self Care | Payer: No Typology Code available for payment source | Source: Ambulatory Visit | Attending: *Deleted | Admitting: *Deleted

## 2021-09-07 VITALS — Wt 274.0 lb

## 2021-09-07 DIAGNOSIS — J449 Chronic obstructive pulmonary disease, unspecified: Secondary | ICD-10-CM | POA: Diagnosis not present

## 2021-09-07 NOTE — Progress Notes (Signed)
Daily Session Note  Patient Details  Name: Ricky BOXLEY Sr. MRN: [id number] Date of Birth: 1951/07/05 Referring Provider:   Flowsheet Row PULMONARY REHAB COPD ORIENTATION from 06/30/2021 in [place]  Referring Provider Dr. Lula Olszewski       Encounter Date: 09/07/2021  Check In:  Session Check In - 09/07/21 1340       Check-In   Supervising physician immediately available to respond to emergencies [facility] MD immediately available    Physician(s) Dr. Marlou Porch    Location [facility]    Staff Present Geanie Cooley, RN;Heather Otho Ket, BS, Exercise Physiologist;Morgyn Marut Wynetta Emery, RN, BSN    Virtual Visit No    Medication changes reported     No    Fall or balance concerns reported    Yes    Comments using rollator for balance    Tobacco Cessation No Change    Warm-up and Cool-down Performed as group-led instruction    Resistance Training Performed Yes    VAD Patient? No    PAD/SET Patient? No      Pain Assessment   Currently in Pain? No/denies    Pain Score 0-No pain    Multiple Pain Sites No             Capillary Blood Glucose: No results found for this or any previous visit (from the past 24 hour(s)).    Social History   Tobacco Use  Smoking Status Former   Packs/day: 1.00   Years: 33.00   Pack years: 33.00   Types: Cigarettes   Quit date: 04/06/2000   Years since quitting: 21.4  Smokeless Tobacco Never  Tobacco Comments   use to chew cigars    Goals Met:  Proper associated with RPD/PD & O2 Sat Independence with exercise equipment Using PLB without cueing & demonstrates good technique Exercise tolerated well No report of concerns or symptoms today Strength training completed today  Goals Unmet:  Not Applicable  Comments: Check out 1430.   Dr. Kathie Dike is Medical Director for [facility] Pulmonary Rehab.

## 2021-09-08 NOTE — Progress Notes (Signed)
Pulmonary Individual Treatment Plan  Patient Details  Name: Ricky CROMARTIE Sr. MRN: [id number] Date of Birth: 12/13/1950 Referring Provider:   Flowsheet Row PULMONARY REHAB COPD ORIENTATION from 06/30/2021 in [facility]  Referring Provider Dr. Lula Olszewski       Initial Encounter Date:  Flowsheet Row PULMONARY REHAB COPD ORIENTATION from 06/30/2021 in [facility]  Date 06/30/21       Visit Diagnosis: Chronic obstructive pulmonary disease, unspecified COPD type ([place])  Patient's Home Medications on Admission:   Current Outpatient Medications:    acetaminophen (TYLENOL) 325 MG tablet, Take 3 tablets (975 mg total) by mouth every 8 (eight) hours as needed for mild pain. (Patient taking differently: Take 975 mg by mouth 2 (two) times daily as needed for mild pain. ), Disp: , Rfl:    albuterol (PROVENTIL HFA;VENTOLIN HFA) 108 (90 Base) MCG/ACT inhaler, Inhale 2 puffs into the lungs every 6 (six) hours as needed for wheezing or shortness of breath., Disp: , Rfl:    ammonium lactate (LAC-HYDRIN) 12 % lotion, Apply 1 application topically daily as needed for dry skin., Disp: , Rfl:    aspirin 81 MG chewable tablet, Chew 1 tablet (81 mg total) by mouth 2 (two) times daily after a meal., Disp: 60 tablet, Rfl: 0   atorvastatin (LIPITOR) 80 MG tablet, Take 80 mg by mouth at bedtime., Disp: , Rfl:    cetirizine (ZYRTEC) 10 MG tablet, Take 10 mg by mouth daily as needed for allergies., Disp: , Rfl:    cholecalciferol (VITAMIN D) 1000 UNITS tablet, Take 1,000 Units by mouth daily. , Disp: , Rfl:    diclofenac Sodium (VOLTAREN) 1 % GEL, Apply topically., Disp: , Rfl:    empagliflozin (JARDIANCE) 25 MG TABS tablet, Take 25 mg by mouth daily., Disp: , Rfl:    ferrous sulfate 325 (65 FE) MG tablet, Take 325 mg by mouth daily. , Disp: , Rfl:    fluticasone (FLONASE) 50 MCG/ACT nasal spray, , Disp: , Rfl:    Fluticasone-Salmeterol (ADVAIR) 250-50 MCG/DOSE AEPB,  INHALE 1 PUFF BY ORAL INHALATION TWO TIMES A DAY **REPLACES SYMBICORT INHALER**, Disp: , Rfl:    furosemide (LASIX) 20 MG tablet, Take 40 mg by mouth daily. , Disp: , Rfl:    gabapentin (NEURONTIN) 300 MG capsule, Take 600 mg by mouth 2 (two) times daily. , Disp: , Rfl:    HUMULIN R U-500 KWIKPEN 500 UNIT/ML kwikpen, 3 (three) times daily with meals. 110 unit with breakfast 110 units with lunch 50-60 units with supper, Disp: , Rfl:    hydrocortisone 2.5 % lotion, Apply topically., Disp: , Rfl:    ketoconazole (NIZORAL) 2 % shampoo, Apply topically., Disp: , Rfl:    losartan (COZAAR) 25 MG tablet, Take 25 mg by mouth daily., Disp: , Rfl:    metFORMIN (GLUCOPHAGE) 1000 MG tablet, Take 1,000 mg by mouth 2 (two) times daily with a meal., Disp: , Rfl:    omeprazole (PRILOSEC) 20 MG capsule, Take 20 mg by mouth 2 (two) times daily before a meal., Disp: , Rfl:    OXYGEN, Inhale 1 L into the lungs continuous. At night time may increase to 2 L as needed for shortness of breath, Disp: , Rfl:    rOPINIRole (REQUIP) 0.5 MG tablet, Take 1 tablet by mouth at bedtime., Disp: , Rfl:    rOPINIRole (REQUIP) 1 MG tablet, Take 1 tablet by mouth at bedtime., Disp: , Rfl:    selenium sulfide (SELSUN) 2.5 %  shampoo, Apply topically., Disp: , Rfl:    SEMAGLUTIDE, 1 MG/DOSE, [place], Inject 1 mg into the skin once a week., Disp: , Rfl:    sodium chloride (ALTAMIST SPRAY) 0.65 % nasal spray, Place into the nose., Disp: , Rfl:    tamsulosin (FLOMAX) 0.4 MG CAPS capsule, Take 0.4 mg by mouth daily., Disp: , Rfl:    tiotropium (SPIRIVA HANDIHALER) 18 MCG inhalation capsule, Place 18 mcg into inhaler and inhale daily., Disp: , Rfl:   Past Medical History: Past Medical History:  Diagnosis Date   Arthritis    Cancer ([place])    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   Complication of anesthesia    hard for him to wake up from Anesthesia from left nephrectomy   COPD (chronic obstructive pulmonary disease) ([place])    Diabetes mellitus  without complication ([place])    DVT (deep venous thrombosis) ([place]) 01/08/2014, 2010   upper extremity   GERD (gastroesophageal reflux disease)    Hallux limitus 05/18/2015   from notes from [facility]    Headache    migraines   History of kidney stones    Hypercholesterolemia    Hypertension    Impaired hearing    Intervertebral disc syndrome    Kidney stones    Pulmonary fibrosis ([place])    Renal calculi 01/08/2014   frrom noted from [facility] .in chart   Restless legs 01/08/2014   Sciatic leg pain    paralysis of sciatic nerve   Sleep apnea    CPAP/BIPAP   Tinnitus     Tobacco Use: Social History   Tobacco Use  Smoking Status Former   Packs/day: 1.00   Years: 33.00   Pack years: 33.00   Types: Cigarettes   Quit date: 04/06/2000   Years since quitting: 21.4  Smokeless Tobacco Never  Tobacco Comments   use to chew cigars    Labs: Recent Review Flowsheet Data     Labs for ITP Cardiac and Pulmonary Rehab Latest Ref Rng & Units 01/08/2015 02/10/2017 02/10/2017 02/11/2017   Hemoglobin A1c 4.8 - 5.6 % 8.1(H) - - -   PHART 7.350 - 7.450 - 7.181(LL) 7.217(L) 7.298(L)   PCO2ART 32.0 - 48.0 mmHg - 82.8(HH) 73.1(HH) 62.5(H)   HCO3 20.0 - 28.0 mmol/L - 29.8(H) 28.6(H) 29.4(H)   ACIDBASEDEF 0.0 - 2.0 mmol/L - 0.7 0.8 -   O2SAT % - 98.5 93.5 96.1       Capillary Blood Glucose: Lab Results  Component Value Date   GLUCAP 228 (H) 06/30/2021   GLUCAP 181 (H) 09/07/2018   GLUCAP 91 08/24/2018   GLUCAP 119 (H) 08/24/2018   GLUCAP 339 (H) 08/22/2018     Pulmonary Assessment Scores:  Pulmonary Assessment Scores     Row Name 06/30/21 1426         ADL UCSD   ADL Phase Entry     SOB Score total 30     Rest 2     Walk 3     Stairs 5     Bath 3     Dress 3     Shop 0           CAT Score   CAT Score 26           mMRC Score   mMRC Score 4             UCSD: Self-administered rating of dyspnea associated with activities of daily living (ADLs) 6-point scale (0  = "not at all"  to 5 = "maximal or unable to do because of breathlessness")  Scoring Scores range from 0 to 120.  Minimally important difference is 5 units  CAT: CAT can identify the health impairment of COPD patients and is better correlated with disease progression.  CAT has a scoring range of zero to 40. The CAT score is classified into four groups of low (less than 10), medium (10 - 20), high (21-30) and very high (31-40) based on the impact level of disease on health status. A CAT score over 10 suggests significant symptoms.  A worsening CAT score could be explained by an exacerbation, poor medication adherence, poor inhaler technique, or progression of COPD or comorbid conditions.  CAT MCID is 2 points  mMRC: mMRC (Modified Medical Research Council) Dyspnea Scale is used to assess the degree of baseline functional disability in patients of respiratory disease due to dyspnea. No minimal important difference is established. A decrease in score of 1 point or greater is considered a positive change.   Pulmonary Function Assessment:   Exercise Target Goals: Exercise Program Goal: Individual exercise prescription set using results from initial 6 min walk test and THRR while considering  patient's activity barriers and safety.   Exercise Prescription Goal: Initial exercise prescription builds to 30-45 minutes a day of aerobic activity, 2-3 days per week.  Home exercise guidelines will be given to patient during program as part of exercise prescription that the participant will acknowledge.  Activity Barriers & Risk Stratification:  Activity Barriers & Cardiac Risk Stratification - 06/30/21 1307       Activity Barriers & Cardiac Risk Stratification   Activity Barriers Arthritis;Back Problems;Left Hip Replacement;Joint Problems;Deconditioning;Shortness of Breath;History of Falls;Assistive Device;Balance Concerns    Cardiac Risk Stratification Low             6 Minute Walk:  6 Minute  Walk     Row Name 06/30/21 1435         6 Minute Walk   Phase Initial     Distance 550 feet     Walk Time 6 minutes     # of Rest Breaks 2     MPH 1     METS 0.96     RPE 13     Perceived Dyspnea  15     VO2 Peak 3.35     Symptoms Yes (comment)     Comments used rollator. Two standing breaks due to SOB     Resting HR 71 bpm     Resting BP 126/70     Resting Oxygen Saturation  96 %     Exercise Oxygen Saturation  during 6 min walk 93 %     Max Ex. HR 106 bpm     Max Ex. BP 154/60     2 Minute Post BP 126/68           Interval HR   1 Minute HR 92     2 Minute HR 100     3 Minute HR 96     4 Minute HR 106     5 Minute HR 99     6 Minute HR 98     2 Minute Post HR 82     Interval Heart Rate? Yes           Interval Oxygen   Interval Oxygen? Yes     Baseline Oxygen Saturation % 96 %     1 Minute Oxygen Saturation % 96 %  1 Minute Liters of Oxygen 4 L     2 Minute Oxygen Saturation % 95 %     2 Minute Liters of Oxygen 4 L     3 Minute Oxygen Saturation % 93 %     3 Minute Liters of Oxygen 4 L     4 Minute Oxygen Saturation % 95 %     4 Minute Liters of Oxygen 4 L     5 Minute Oxygen Saturation % 94 %     5 Minute Liters of Oxygen 4 L     6 Minute Oxygen Saturation % 94 %     6 Minute Liters of Oxygen 4 L     2 Minute Post Oxygen Saturation % 97 %     2 Minute Post Liters of Oxygen 4 L              Oxygen Initial Assessment:  Oxygen Initial Assessment - 06/30/21 1308       Home Oxygen   Home Oxygen Device Home Concentrator;E-Tanks    Sleep Oxygen Prescription BiPAP    Liters per minute 3    Home Resting Oxygen Prescription Continuous    Liters per minute 3      Initial 6 min Walk   Oxygen Used Continuous    Liters per minute 4      Intervention   Short Term Goals To learn and exhibit compliance with exercise, home and travel O2 prescription;To learn and understand importance of monitoring SPO2 with pulse oximeter and demonstrate accurate use of  the pulse oximeter.;To learn and understand importance of maintaining oxygen saturations>88%;To learn and demonstrate proper pursed lip breathing techniques or other breathing techniques.     Long  Term Goals Exhibits compliance with exercise, home  and travel O2 prescription;Verbalizes importance of monitoring SPO2 with pulse oximeter and return demonstration;Maintenance of O2 saturations>88%;Exhibits proper breathing techniques, such as pursed lip breathing or other method taught during program session             Oxygen Re-Evaluation:  Oxygen Re-Evaluation     Row Name 07/13/21 1437 08/10/21 1643 09/07/21 1536         Program Oxygen Prescription   Program Oxygen Prescription Continuous;E-Tanks Continuous;E-Tanks Continuous;E-Tanks     Liters per minute 4 4 3            Home Oxygen   Home Oxygen Device Home Concentrator;E-Tanks Home Concentrator;E-Tanks Home Concentrator;E-Tanks     Sleep Oxygen Prescription BiPAP BiPAP BiPAP     Liters per minute 3 3 3      Home Exercise Oxygen Prescription Continuous Continuous Continuous     Liters per minute 3 3 3      Home Resting Oxygen Prescription Continuous Continuous Continuous     Liters per minute 3 3 3      Compliance with Home Oxygen Use Yes Yes Yes           Goals/Expected Outcomes   Short Term Goals To learn and exhibit compliance with exercise, home and travel O2 prescription;To learn and understand importance of monitoring SPO2 with pulse oximeter and demonstrate accurate use of the pulse oximeter.;To learn and understand importance of maintaining oxygen saturations>88%;To learn and demonstrate proper pursed lip breathing techniques or other breathing techniques.  To learn and exhibit compliance with exercise, home and travel O2 prescription;To learn and understand importance of monitoring SPO2 with pulse oximeter and demonstrate accurate use of the pulse oximeter.;To learn and understand importance of maintaining oxygen  saturations>88%;To learn  and demonstrate proper pursed lip breathing techniques or other breathing techniques.  To learn and exhibit compliance with exercise, home and travel O2 prescription;To learn and understand importance of monitoring SPO2 with pulse oximeter and demonstrate accurate use of the pulse oximeter.;To learn and understand importance of maintaining oxygen saturations>88%;To learn and demonstrate proper pursed lip breathing techniques or other breathing techniques.      Long  Term Goals Exhibits compliance with exercise, home  and travel O2 prescription;Verbalizes importance of monitoring SPO2 with pulse oximeter and return demonstration;Maintenance of O2 saturations>88%;Exhibits proper breathing techniques, such as pursed lip breathing or other method taught during program session Exhibits compliance with exercise, home  and travel O2 prescription;Verbalizes importance of monitoring SPO2 with pulse oximeter and return demonstration;Maintenance of O2 saturations>88%;Exhibits proper breathing techniques, such as pursed lip breathing or other method taught during program session Verbalizes importance of monitoring SPO2 with pulse oximeter and return demonstration;Maintenance of O2 saturations>88%;Exhibits compliance with exercise, home  and travel O2 prescription;Exhibits proper breathing techniques, such as pursed lip breathing or other method taught during program session     Goals/Expected Outcomes compliance compliance compliance              Oxygen Discharge (Final Oxygen Re-Evaluation):  Oxygen Re-Evaluation - 09/07/21 1536       Program Oxygen Prescription   Program Oxygen Prescription Continuous;E-Tanks    Liters per minute 3      Home Oxygen   Home Oxygen Device Home Concentrator;E-Tanks    Sleep Oxygen Prescription BiPAP    Liters per minute 3    Home Exercise Oxygen Prescription Continuous    Liters per minute 3    Home Resting Oxygen Prescription Continuous    Liters  per minute 3    Compliance with Home Oxygen Use Yes      Goals/Expected Outcomes   Short Term Goals To learn and exhibit compliance with exercise, home and travel O2 prescription;To learn and understand importance of monitoring SPO2 with pulse oximeter and demonstrate accurate use of the pulse oximeter.;To learn and understand importance of maintaining oxygen saturations>88%;To learn and demonstrate proper pursed lip breathing techniques or other breathing techniques.     Long  Term Goals Verbalizes importance of monitoring SPO2 with pulse oximeter and return demonstration;Maintenance of O2 saturations>88%;Exhibits compliance with exercise, home  and travel O2 prescription;Exhibits proper breathing techniques, such as pursed lip breathing or other method taught during program session    Goals/Expected Outcomes compliance             Initial Exercise Prescription:  Initial Exercise Prescription - 06/30/21 1400       Date of Initial Exercise RX and Referring Provider   Date 06/30/21    Referring Provider Dr. Lula Olszewski    Expected Discharge Date 10/28/21      Oxygen   Oxygen Continuous    Liters 4      NuStep   Level 1    SPM 60    Minutes 39      Prescription Details   Frequency (times per week) 2    Duration Progress to 30 minutes of continuous aerobic without signs/symptoms of physical distress      Intensity   THRR 40-80% of Max Heartrate 60-120    Ratings of Perceived Exertion 11-13    Perceived Dyspnea 0-4      Resistance Training   Training Prescription Yes    Weight 3 lbs    Reps 10-15  Perform Capillary Blood Glucose checks as needed.  Exercise Prescription Changes:   Exercise Prescription Changes     Row Name 07/13/21 1400 07/22/21 1415 08/05/21 1330 08/24/21 1400 09/07/21 1500     Response to Exercise   Blood Pressure (Admit) 158/68 138/70 138/68 118/60 110/50   Blood Pressure (Exercise) 148/66 142/72 160/72 125/65 148/78   Blood  Pressure (Exit) 118/62 100/70 132/68 120/62 120/58   Heart Rate (Admit) 92 bpm 62 bpm 91 bpm 85 bpm 95 bpm   Heart Rate (Exercise) 104 bpm 93 bpm 112 bpm 104 bpm 103 bpm   Heart Rate (Exit) 101 bpm 93 bpm 100 bpm 95 bpm 98 bpm   Oxygen Saturation (Admit) 95 % 97 % 98 % 96 % 97 %   Oxygen Saturation (Exercise) 93 % 95 % 96 % 92 % 94 %   Oxygen Saturation (Exit) 94 % 96 % 95 % 98 % 96 %   Rating of Perceived Exertion (Exercise) 12 11 12 11 11    Perceived Dyspnea (Exercise) 12 11 12 9 9    Duration Continue with 30 min of aerobic exercise without signs/symptoms of physical distress. Continue with 30 min of aerobic exercise without signs/symptoms of physical distress. Continue with 30 min of aerobic exercise without signs/symptoms of physical distress. Continue with 30 min of aerobic exercise without signs/symptoms of physical distress. Continue with 30 min of aerobic exercise without signs/symptoms of physical distress.   Intensity THRR unchanged THRR unchanged THRR unchanged THRR unchanged THRR unchanged     Progression   Progression Continue to progress workloads to maintain intensity without signs/symptoms of physical distress. Continue to progress workloads to maintain intensity without signs/symptoms of physical distress. Continue to progress workloads to maintain intensity without signs/symptoms of physical distress. Continue to progress workloads to maintain intensity without signs/symptoms of physical distress. Continue to progress workloads to maintain intensity without signs/symptoms of physical distress.     Resistance Training   Training Prescription Yes Yes Yes Yes Yes   Weight 4 lbs 4 lbs 5 lbs 5 5   Reps 10-15 10-15 10-15 10-15 10-15   Time 10 Minutes 10 Minutes 10 Minutes 10 Minutes 10 Minutes     Oxygen   Oxygen Continuous Continuous Continuous Continuous Continuous   Liters 4 4 4 3 3      NuStep   Level 1 3 2 2 4    SPM 89 70 82 80 61   Minutes 39 39 39 39 17   METs 2 2 2 2   2.1     Arm Ergometer   Level -- -- -- -- 4   Minutes -- -- -- -- 22   METs -- -- -- -- 1.9            Exercise Comments:   Exercise Goals and Review:   Exercise Goals     Row Name 06/30/21 1438 07/13/21 1440 08/10/21 1645 09/07/21 1534       Exercise Goals   Increase Physical Activity Yes Yes Yes Yes    Intervention Provide advice, education, support and counseling about physical activity/exercise needs.;Develop an individualized exercise prescription for aerobic and resistive training based on initial evaluation findings, risk stratification, comorbidities and participant's personal goals. Provide advice, education, support and counseling about physical activity/exercise needs.;Develop an individualized exercise prescription for aerobic and resistive training based on initial evaluation findings, risk stratification, comorbidities and participant's personal goals. Provide advice, education, support and counseling about physical activity/exercise needs.;Develop an individualized exercise prescription for aerobic and resistive  training based on initial evaluation findings, risk stratification, comorbidities and participant's personal goals. Provide advice, education, support and counseling about physical activity/exercise needs.;Develop an individualized exercise prescription for aerobic and resistive training based on initial evaluation findings, risk stratification, comorbidities and participant's personal goals.    Expected Outcomes Short Term: Attend rehab on a regular basis to increase amount of physical activity.;Long Term: Add in home exercise to make exercise part of routine and to increase amount of physical activity.;Long Term: Exercising regularly at least 3-5 days a week. Short Term: Attend rehab on a regular basis to increase amount of physical activity.;Long Term: Add in home exercise to make exercise part of routine and to increase amount of physical activity.;Long Term:  Exercising regularly at least 3-5 days a week. Short Term: Attend rehab on a regular basis to increase amount of physical activity.;Long Term: Add in home exercise to make exercise part of routine and to increase amount of physical activity.;Long Term: Exercising regularly at least 3-5 days a week. Short Term: Attend rehab on a regular basis to increase amount of physical activity.;Long Term: Add in home exercise to make exercise part of routine and to increase amount of physical activity.;Long Term: Exercising regularly at least 3-5 days a week.    Increase Strength and Stamina Yes Yes Yes Yes    Intervention Provide advice, education, support and counseling about physical activity/exercise needs.;Develop an individualized exercise prescription for aerobic and resistive training based on initial evaluation findings, risk stratification, comorbidities and participant's personal goals. Provide advice, education, support and counseling about physical activity/exercise needs.;Develop an individualized exercise prescription for aerobic and resistive training based on initial evaluation findings, risk stratification, comorbidities and participant's personal goals. Provide advice, education, support and counseling about physical activity/exercise needs.;Develop an individualized exercise prescription for aerobic and resistive training based on initial evaluation findings, risk stratification, comorbidities and participant's personal goals. Provide advice, education, support and counseling about physical activity/exercise needs.;Develop an individualized exercise prescription for aerobic and resistive training based on initial evaluation findings, risk stratification, comorbidities and participant's personal goals.    Expected Outcomes Short Term: Increase workloads from initial exercise prescription for resistance, speed, and METs.;Short Term: Perform resistance training exercises routinely during rehab and add in  resistance training at home;Long Term: Improve cardiorespiratory fitness, muscular endurance and strength as measured by increased METs and functional capacity (6MWT) Short Term: Increase workloads from initial exercise prescription for resistance, speed, and METs.;Short Term: Perform resistance training exercises routinely during rehab and add in resistance training at home;Long Term: Improve cardiorespiratory fitness, muscular endurance and strength as measured by increased METs and functional capacity (6MWT) Short Term: Increase workloads from initial exercise prescription for resistance, speed, and METs.;Short Term: Perform resistance training exercises routinely during rehab and add in resistance training at home;Long Term: Improve cardiorespiratory fitness, muscular endurance and strength as measured by increased METs and functional capacity (6MWT) Short Term: Increase workloads from initial exercise prescription for resistance, speed, and METs.;Short Term: Perform resistance training exercises routinely during rehab and add in resistance training at home;Long Term: Improve cardiorespiratory fitness, muscular endurance and strength as measured by increased METs and functional capacity (6MWT)    Able to understand and use rate of perceived exertion (RPE) scale Yes Yes Yes Yes    Intervention Provide education and explanation on how to use RPE scale Provide education and explanation on how to use RPE scale Provide education and explanation on how to use RPE scale Provide education and explanation on how to use RPE  scale    Expected Outcomes Short Term: Able to use RPE daily in rehab to express subjective intensity level;Long Term:  Able to use RPE to guide intensity level when exercising independently Short Term: Able to use RPE daily in rehab to express subjective intensity level;Long Term:  Able to use RPE to guide intensity level when exercising independently Short Term: Able to use RPE daily in rehab to  express subjective intensity level;Long Term:  Able to use RPE to guide intensity level when exercising independently Short Term: Able to use RPE daily in rehab to express subjective intensity level;Long Term:  Able to use RPE to guide intensity level when exercising independently    Able to understand and use Dyspnea scale Yes Yes Yes Yes    Intervention Provide education and explanation on how to use Dyspnea scale Provide education and explanation on how to use Dyspnea scale Provide education and explanation on how to use Dyspnea scale Provide education and explanation on how to use Dyspnea scale    Expected Outcomes Short Term: Able to use Dyspnea scale daily in rehab to express subjective sense of shortness of breath during exertion;Long Term: Able to use Dyspnea scale to guide intensity level when exercising independently Short Term: Able to use Dyspnea scale daily in rehab to express subjective sense of shortness of breath during exertion;Long Term: Able to use Dyspnea scale to guide intensity level when exercising independently Short Term: Able to use Dyspnea scale daily in rehab to express subjective sense of shortness of breath during exertion;Long Term: Able to use Dyspnea scale to guide intensity level when exercising independently Short Term: Able to use Dyspnea scale daily in rehab to express subjective sense of shortness of breath during exertion;Long Term: Able to use Dyspnea scale to guide intensity level when exercising independently    Knowledge and understanding of Target Heart Rate Range (THRR) Yes Yes Yes Yes    Intervention Provide education and explanation of THRR including how the numbers were predicted and where they are located for reference Provide education and explanation of THRR including how the numbers were predicted and where they are located for reference Provide education and explanation of THRR including how the numbers were predicted and where they are located for reference  Provide education and explanation of THRR including how the numbers were predicted and where they are located for reference    Expected Outcomes Short Term: Able to state/look up THRR;Short Term: Able to use daily as guideline for intensity in rehab;Long Term: Able to use THRR to govern intensity when exercising independently Short Term: Able to state/look up THRR;Short Term: Able to use daily as guideline for intensity in rehab;Long Term: Able to use THRR to govern intensity when exercising independently Short Term: Able to state/look up THRR;Short Term: Able to use daily as guideline for intensity in rehab;Long Term: Able to use THRR to govern intensity when exercising independently Short Term: Able to state/look up THRR;Short Term: Able to use daily as guideline for intensity in rehab;Long Term: Able to use THRR to govern intensity when exercising independently    Understanding of Exercise Prescription Yes Yes Yes Yes    Intervention Provide education, explanation, and written materials on patient's individual exercise prescription Provide education, explanation, and written materials on patient's individual exercise prescription Provide education, explanation, and written materials on patient's individual exercise prescription Provide education, explanation, and written materials on patient's individual exercise prescription    Expected Outcomes Short Term: Able to explain program exercise prescription;Long  Term: Able to explain home exercise prescription to exercise independently Short Term: Able to explain program exercise prescription;Long Term: Able to explain home exercise prescription to exercise independently Short Term: Able to explain program exercise prescription;Long Term: Able to explain home exercise prescription to exercise independently Short Term: Able to explain program exercise prescription;Long Term: Able to explain home exercise prescription to exercise independently              Exercise Goals Re-Evaluation :  Exercise Goals Re-Evaluation     [place] Name 07/13/21 1440 08/10/21 1645 09/07/21 1534         Exercise Goal Re-Evaluation   Exercise Goals Review Increase Physical Activity;Increase Strength and Stamina;Able to understand and use rate of perceived exertion (RPE) scale;Knowledge and understanding of Target Heart Rate Range (THRR);Able to check pulse independently;Understanding of Exercise Prescription Increase Physical Activity;Increase Strength and Stamina;Able to understand and use rate of perceived exertion (RPE) scale;Knowledge and understanding of Target Heart Rate Range (THRR);Able to check pulse independently;Understanding of Exercise Prescription Increase Physical Activity;Increase Strength and Stamina;Able to understand and use rate of perceived exertion (RPE) scale;Knowledge and understanding of Target Heart Rate Range (THRR);Able to check pulse independently;Understanding of Exercise Prescription     Comments Pt has completed 3 exercise sessions. He is deconditioned and limited by right hip pain, but he does give good effort while he is in rehab. He is currently exercising at 2.0 METs on the stepper. Will continue to monitor and progress as able. Pt has completed 8 exercise sessions. He has made some progress but has had to miss several sessions due to other appointments and health problems. He is currently exercising at 2.0 METs on the stepper. Will continue to monitor and progress as able. Pt has completed 15 sessions of cardiac rehab. He is progressing and increasing his workload. He is currently exercising at 2.1 METs on the stepper. Will continue to monitor and progress as able.     Expected Outcomes Through exercise at rehab and at home, the patient will meet their stated goals. Through exercise at rehab and at home, the patient will meet their stated goals. Through exercise at rehab and at home, the patient will meet their stated goals.               Discharge Exercise Prescription (Final Exercise Prescription Changes):  Exercise Prescription Changes - 09/07/21 1500       Response to Exercise   Blood Pressure (Admit) 110/50    Blood Pressure (Exercise) 148/78    Blood Pressure (Exit) 120/58    Heart Rate (Admit) 95 bpm    Heart Rate (Exercise) 103 bpm    Heart Rate (Exit) 98 bpm    Oxygen Saturation (Admit) 97 %    Oxygen Saturation (Exercise) 94 %    Oxygen Saturation (Exit) 96 %    Rating of Perceived Exertion (Exercise) 11    Perceived Dyspnea (Exercise) 9    Duration Continue with 30 min of aerobic exercise without signs/symptoms of physical distress.    Intensity THRR unchanged      Progression   Progression Continue to progress workloads to maintain intensity without signs/symptoms of physical distress.      Resistance Training   Training Prescription Yes    Weight 5    Reps 10-15    Time 10 Minutes      Oxygen   Oxygen Continuous    Liters 3      NuStep   Level 4    SPM  61    Minutes 17    METs 2.1      Arm Ergometer   Level 4    Minutes 22    METs 1.9             Nutrition:  Target Goals: Understanding of nutrition guidelines, daily intake of sodium 1500mg , cholesterol 200mg , calories 30% from fat and 7% or less from saturated fats, daily to have 5 or more servings of fruits and vegetables.  Biometrics:  Pre Biometrics - 06/30/21 1439       Pre Biometrics   Height 5\' 8"  (1.727 m)    Weight 126.7 kg    Waist Circumference 55 inches    Hip Circumference 51 inches    Waist to Hip Ratio 1.08 %    BMI (Calculated) 42.48    Triceps Skinfold 7 mm    % Body Fat 37.3 %    Grip Strength 28 kg    Flexibility 0 in    Single Leg Stand 0 seconds              Nutrition Therapy Plan and Nutrition Goals:  Nutrition Therapy & Goals - 08/30/21 1206       Personal Nutrition Goals   Comments Will continue to provide healthy nutritional education through handout.  Weight remains about the  same around 126kg.      Intervention Plan   Intervention Nutrition handout(s) given to patient.             Nutrition Assessments:  Nutrition Assessments - 06/30/21 1414       MEDFICTS Scores   Pre Score 127            MEDIFICTS Score Key: ?70 Need to make dietary changes  40-70 Heart Healthy Diet ? 40 Therapeutic Level Cholesterol Diet   Picture Your Plate Scores: <75 Unhealthy dietary pattern with much room for improvement. 41-50 Dietary pattern unlikely to meet recommendations for good health and room for improvement. 51-60 More healthful dietary pattern, with some room for improvement.  >60 Healthy dietary pattern, although there may be some specific behaviors that could be improved.    Nutrition Goals Re-Evaluation:   Nutrition Goals Discharge (Final Nutrition Goals Re-Evaluation):   Psychosocial: Target Goals: Acknowledge presence or absence of significant depression and/or stress, maximize coping skills, provide positive support system. Participant is able to verbalize types and ability to use techniques and skills needed for reducing stress and depression.  Initial Review & Psychosocial Screening:  Initial Psych Review & Screening - 06/30/21 1448       Initial Review   Current issues with None Identified      Family Dynamics   Good Support System? Yes      Barriers   Psychosocial barriers to participate in program Psychosocial barriers identified (see note)      Screening Interventions   Interventions Encouraged to exercise;Program counselor consult;Provide feedback about the scores to participant;To provide support and resources with identified psychosocial needs    Expected Outcomes Short Term goal: Utilizing psychosocial counselor, staff and physician to assist with identification of specific Stressors or current issues interfering with healing process. Setting desired goal for each stressor or current issue identified.;Long Term Goal: Stressors  or current issues are controlled or eliminated.;Short Term goal: Identification and review with participant of any Quality of Life or Depression concerns found by scoring the questionnaire.             Quality of Life Scores:  Quality of  Life - 06/30/21 1434       Quality of Life   Select Quality of Life      Quality of Life Scores   Health/Function Pre 12.66 %    Socioeconomic Pre 11.8 %    Psych/Spiritual Pre 11.79 %    Family Pre 12.4 %    GLOBAL Pre 12.3 %            Scores of 19 and below usually indicate a poorer quality of life in these areas.  A difference of  2-3 points is a clinically meaningful difference.  A difference of 2-3 points in the total score of the Quality of Life Index has been associated with significant improvement in overall quality of life, self-image, physical symptoms, and general health in studies assessing change in quality of life.   PHQ-9: Recent Review Flowsheet Data     Depression screen [facility] 2/9 06/30/2021 12/31/2018 09/28/2018 08/07/2018   Decreased Interest 0 0 0 1   Down, Depressed, Hopeless 0 0 0 2   PHQ - 2 Score 0 0 0 3   Altered sleeping 0 0 0 2   Tired, decreased energy 0 1 1 2    Change in appetite 1 0 0 0   Feeling bad or failure about yourself  0 0 0 0   Trouble concentrating 0 0 0 0   Moving slowly or fidgety/restless 0 0 0 0   Suicidal thoughts 0 0 0 0   PHQ-9 Score 1 1 1 7    Difficult doing work/chores Not difficult at all Not difficult at all Not difficult at all Very difficult      Interpretation of Total Score  Total Score Depression Severity:  1-4 = Minimal depression, 5-9 = Mild depression, 10-14 = Moderate depression, 15-19 = Moderately severe depression, 20-27 = Severe depression   Psychosocial Evaluation and Intervention:  Psychosocial Evaluation - 06/30/21 1449       Psychosocial Evaluation & Interventions   Interventions Encouraged to exercise with the program and follow exercise prescription;Stress  management education;Relaxation education    Comments Patient has no psychosocial barriers identified to participate in PR. His initial PHQ-9 score was 1 and his QOL score 12.3% overall scoreing 11 and 12% in all categories. He denies any current or history of depression, anxiety, or stress. His wife is present with him today whom he says is his emotional support person. They both have children of their own but none together. He has 3 sons that all live out-of-state and do not contact him often. She has 2 daughters that live near and he sees and talks to them often. He has polyarthritis which limits his mobility along with COPD that prevents him from doing activities. His long term goal is to get off of oxygen. For the program, he would like for his SOB to improve and to get stronger to be able to do more activities. He scored 127 on his diet assessment and stated he was not interested in changing his diet and that he "loves to eat". Difficult to measure his motivation to improve his health overall but he did demonstate an interest in participating in the program to hopefully meet his goals.    Expected Outcomes Patient will continue to have no psychosocial barriers identififed.    Continue Psychosocial Services  No Follow up required             Psychosocial Re-Evaluation:  Psychosocial Re-Evaluation     [place] Name 07/07/21 1150 08/05/21  1500 08/30/21 1211         Psychosocial Re-Evaluation   Current issues with None Identified None Identified None Identified     Comments Patient is new to the program. He continues to have no psychosocial barriers identified. He has completed 1 session. We will continue to montior his progress. Patient continues to have no psychosocial barriers identified. He has completed 8 sessions and seems to enjoy coming to the program. He is very interactive with staff and other patients in his class. He demonstrates a positive attitude and an interest in improving his  health. We will continue to monitor. Patient continues to have no psychosocial barriers identified. He has completed 12 sessions and seems to enjoy coming to the program. He is very interactive with staff and other patients in his class. He demonstrates a positive outlook and attitude He continues to interact well with staff and class. We will continue to monitor.     Expected Outcomes Patient will continue to have no psychosocial barriers identified. Patient will continue to have no psychosocial barriers identified. Patient will continue to have no psychosocial barriers identified.     Interventions Stress management education;Encouraged to attend Pulmonary Rehabilitation for the exercise;Relaxation education Stress management education;Encouraged to attend Pulmonary Rehabilitation for the exercise;Relaxation education Stress management education;Encouraged to attend Pulmonary Rehabilitation for the exercise     Continue Psychosocial Services  No Follow up required No Follow up required Follow up required by staff              Psychosocial Discharge (Final Psychosocial Re-Evaluation):  Psychosocial Re-Evaluation - 08/30/21 1211       Psychosocial Re-Evaluation   Current issues with None Identified    Comments Patient continues to have no psychosocial barriers identified. He has completed 12 sessions and seems to enjoy coming to the program. He is very interactive with staff and other patients in his class. He demonstrates a positive outlook and attitude He continues to interact well with staff and class. We will continue to monitor.    Expected Outcomes Patient will continue to have no psychosocial barriers identified.    Interventions Stress management education;Encouraged to attend Pulmonary Rehabilitation for the exercise    Continue Psychosocial Services  Follow up required by staff              Education: Education Goals: Education classes will be provided on a weekly basis,  covering required topics. Participant will state understanding/return demonstration of topics presented.  Learning Barriers/Preferences:  Learning Barriers/Preferences - 06/30/21 1414       Learning Barriers/Preferences   Learning Barriers Hearing    Learning Preferences Individual Instruction;Skilled Demonstration             Education Topics: How Lungs Work and Diseases: - Discuss the anatomy of the lungs and diseases that can affect the lungs, such as COPD. Flowsheet Row PULMONARY REHAB CHRONIC OBSTRUCTIVE PULMONARY DISEASE from 09/02/2021 in [place]  Date 09/02/21  Educator pb  Instruction Review Code 1- Verbalizes Understanding       Exercise: -Discuss the importance of exercise, FITT principles of exercise, normal and abnormal responses to exercise, and how to exercise safely.   Environmental Irritants: -Discuss types of environmental irritants and how to limit exposure to environmental irritants.   Meds/Inhalers and oxygen: - Discuss respiratory medications, definition of an inhaler and oxygen, and the proper way to use an inhaler and oxygen.   Energy Saving Techniques: - Discuss methods to conserve energy and  decrease shortness of breath when performing activities of daily living.    Bronchial Hygiene / Breathing Techniques: - Discuss breathing mechanics, pursed-lip breathing technique,  proper posture, effective ways to clear airways, and other functional breathing techniques   Cleaning Equipment: - Provides group verbal and written instruction about the health risks of elevated stress, cause of high stress, and healthy ways to reduce stress. Flowsheet Row PULMONARY REHAB CHRONIC OBSTRUCTIVE PULMONARY DISEASE from 09/02/2021 in [place]  Date 07/08/21  Educator DF  Instruction Review Code 2- Demonstrated Understanding       Nutrition I: Fats: - Discuss the types of cholesterol, what cholesterol does to  the body, and how cholesterol levels can be controlled.   Nutrition II: Labels: -Discuss the different components of food labels and how to read food labels. Flowsheet Row PULMONARY REHAB CHRONIC OBSTRUCTIVE PULMONARY DISEASE from 09/02/2021 in [place]  Date 07/22/21  Educator pb  Instruction Review Code 1- Verbalizes Understanding       Respiratory Infections: - Discuss the signs and symptoms of respiratory infections, ways to prevent respiratory infections, and the importance of seeking medical treatment when having a respiratory infection. Flowsheet Row PULMONARY REHAB CHRONIC OBSTRUCTIVE PULMONARY DISEASE from 09/02/2021 in [place]  Date 07/29/21  Educator DJ  Instruction Review Code 1- Verbalizes Understanding       Stress I: Signs and Symptoms: - Discuss the causes of stress, how stress may lead to anxiety and depression, and ways to limit stress. Flowsheet Row PULMONARY REHAB CHRONIC OBSTRUCTIVE PULMONARY DISEASE from 09/02/2021 in [place]  Date 08/05/21  Educator pb  Instruction Review Code 1- Verbalizes Understanding       Stress II: Relaxation: -Discuss relaxation techniques to limit stress.   Oxygen for Home/Travel: - Discuss how to prepare for travel when on oxygen and proper ways to transport and store oxygen to ensure safety.   Knowledge Questionnaire Score:  Knowledge Questionnaire Score - 06/30/21 1448       Knowledge Questionnaire Score   Pre Score 15/18             Core Components/Risk Factors/Patient Goals at Admission:  Personal Goals and Risk Factors at Admission - 06/30/21 1416       Core Components/Risk Factors/Patient Goals on Admission    Weight Management Obesity    Improve shortness of breath with ADL's Yes    Intervention Provide education, individualized exercise plan and daily activity instruction to help decrease symptoms of SOB with activities of  daily living.    Expected Outcomes Short Term: Improve cardiorespiratory fitness to achieve a reduction of symptoms when performing ADLs;Long Term: Be able to perform more ADLs without symptoms or delay the onset of symptoms    Diabetes Yes    Intervention Provide education about signs/symptoms and action to take for hypo/hyperglycemia.;Provide education about proper nutrition, including hydration, and aerobic/resistive exercise prescription along with prescribed medications to achieve blood glucose in normal ranges: Fasting glucose 65-99 mg/dL    Expected Outcomes Short Term: Participant verbalizes understanding of the signs/symptoms and immediate care of hyper/hypoglycemia, proper foot care and importance of medication, aerobic/resistive exercise and nutrition plan for blood glucose control.    Personal Goal Other Yes    Personal Goal Improve SOB. Get stronger and strenghten lungs to be able to do more activities.    Intervention Patient will attend PR 2 days/week and supplement with exercise 3 days/week.    Expected Outcomes Patient will complete  the program and meet both personal and program.             Core Components/Risk Factors/Patient Goals Review:   Goals and Risk Factor Review     Row Name 07/07/21 1152 08/05/21 1501 08/30/21 1216         Core Components/Risk Factors/Patient Goals Review   Personal Goals Review Weight Management/Obesity;Improve shortness of breath with ADL's;Diabetes;Hypertension;Stress Weight Management/Obesity;Improve shortness of breath with ADL's;Diabetes;Hypertension;Stress Weight Management/Obesity;Improve shortness of breath with ADL's;Diabetes;Hypertension;Stress     Review Patient was referred to PR with COPD. He has completed 1 session. His goals for the program are to improve his SOB; get stronger and be able to do more activities. We will continue to monitor his progress as he works towards meeting these goals. Patient has completed 8 sessions gaining  1 KG since his initial visit. He is doing well in the program with consistent attendance. He exercises on 4L O2 saturating at 94-96%. His blood pressure is above goal at times. No A1C on file and he does not always report his glucose readings. 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 His personal goals for the program are to improve his SOB; get stronger to be able to do more activities. We will continue to monitor his progress as he works towards meeting these goals. Patient has completed12 sessions. Weight remain consistance around 126kg.  He is doing well in the program with consistent attendance. He exercises on 4L O2 saturating at 92-96%. His blood pressure is above goal at times. No A1C on file and he does not always report his glucose readings. 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 His personal goals for the program are to improve his SOB; get stronger to be able to do more activities. We will continue to monitor his progress as he works towards meeting these goals.     Expected Outcomes Patient will complete the program meeting both program and personal goals. Patient will complete the program meeting both program and personal goals. Patient will complete the program meeting both program and personal goals.              Core Components/Risk Factors/Patient Goals at Discharge (Final Review):   Goals and Risk Factor Review - 08/30/21 1216       Core Components/Risk Factors/Patient Goals Review   Personal Goals Review Weight Management/Obesity;Improve shortness of breath with ADL's;Diabetes;Hypertension;Stress    Review Patient has completed12 sessions. Weight remain consistance around 126kg.  He is doing well in the program with consistent attendance. He exercises on 4L O2 saturating at 92-96%. His blood pressure is above goal at times. No A1C on file and he does not always report his glucose readings. 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  His personal goals for the program are to improve his SOB; get stronger to be able to do more activities. We will continue to monitor his progress as he works towards meeting these goals.    Expected Outcomes Patient will complete the program meeting both program and personal goals.             ITP Comments:   Comments: ITP REVIEW Pt is making expected progress toward pulmonary rehab goals after completing 16 sessions. Recommend continued exercise, life style modification, education, and utilization of breathing techniques to increase stamina and strength and decrease shortness of breath with exertion.

## 2021-09-09 ENCOUNTER — Encounter (HOSPITAL_COMMUNITY)
Admission: RE | Admit: 2021-09-09 | Discharge: 2021-09-09 | Disposition: A | Discharge status: Home / Self Care | Payer: No Typology Code available for payment source | Source: Ambulatory Visit | Attending: *Deleted | Admitting: *Deleted

## 2021-09-09 DIAGNOSIS — J449 Chronic obstructive pulmonary disease, unspecified: Secondary | ICD-10-CM

## 2021-09-09 NOTE — Progress Notes (Signed)
Daily Session Note  Patient Details  Name: Ricky TAPPAN Sr. MRN: [id number] Date of Birth: [birth year] Referring Provider:   Flowsheet Row PULMONARY REHAB COPD ORIENTATION from 06/30/2021 in [place]  Referring Provider Dr. Lula Olszewski       Encounter Date: 09/09/2021  Check In:  Session Check In - 09/09/21 1330       Check-In   Supervising physician immediately available to respond to emergencies [facility] MD immediately available    Physician(s) Dr. Debara Pickett    Location [facility]    Staff Present Geanie Cooley, RN;Dalton Kris Mouton, MS, ACSM-CEP, Exercise Physiologist    Virtual Visit No    Medication changes reported     No    Fall or balance concerns reported    Yes    Comments using rollator for balance    Tobacco Cessation No Change    Warm-up and Cool-down Performed as group-led instruction    Resistance Training Performed Yes    VAD Patient? No    PAD/SET Patient? No      Pain Assessment   Currently in Pain? No/denies    Pain Score 0-No pain    Multiple Pain Sites No             Capillary Blood Glucose: No results found for this or any previous visit (from the past 24 hour(s)).    Social History   Tobacco Use  Smoking Status Former   Packs/day: 1.00   Years: 33.00   Pack years: 33.00   Types: Cigarettes   Quit date: 04/06/2000   Years since quitting: 21.4  Smokeless Tobacco Never  Tobacco Comments   use to chew cigars    Goals Met:  Proper associated with RPD/PD & O2 Sat Independence with exercise equipment Improved SOB with ADL's Exercise tolerated well No report of concerns or symptoms today Strength training completed today  Goals Unmet:  Not Applicable  Comments: check out @ 2:30pm   Dr. Kathie Dike is Medical Director for [facility] Pul[facility].

## 2021-09-14 ENCOUNTER — Encounter (HOSPITAL_COMMUNITY): Payer: No Typology Code available for payment source

## 2021-09-16 ENCOUNTER — Encounter (HOSPITAL_COMMUNITY)
Admission: RE | Admit: 2021-09-16 | Discharge: 2021-09-16 | Disposition: A | Discharge status: Home / Self Care | Payer: No Typology Code available for payment source | Source: Ambulatory Visit | Attending: *Deleted | Admitting: *Deleted

## 2021-09-16 ENCOUNTER — Other Ambulatory Visit: Payer: Self-pay

## 2021-09-16 DIAGNOSIS — J449 Chronic obstructive pulmonary disease, unspecified: Secondary | ICD-10-CM

## 2021-09-16 NOTE — Progress Notes (Signed)
Daily Session Note  Patient Details  Name: Ricky DOM Sr. MRN: [id number] Date of Birth: 1951/01/08 Referring Provider:   Flowsheet Row PULMONARY REHAB COPD ORIENTATION from 06/30/2021 in [place]  Referring Provider Dr. Lula Olszewski       Encounter Date: 09/16/2021  Check In:  Session Check In - 09/16/21 1330       Check-In   Supervising physician immediately available to respond to emergencies [facility] immediately available    Physician(s) Dr. Domenic Polite    Location [facility]    Staff Present Hoy Register, MS, ACSM-CEP, Exercise Physiologist;Ayden Hardwick Zigmund Daniel, Exercise Physiologist;Phyllis Billingsley, RN    Virtual Visit No    Medication changes reported     No    Fall or balance concerns reported    Yes    Comments using rollator for balance    Tobacco Cessation No Change    Warm-up and Cool-down Performed as group-led instruction    Resistance Training Performed Yes    VAD Patient? No    PAD/SET Patient? No      Pain Assessment   Currently in Pain? No/denies    Pain Score 0-No pain             Capillary Blood Glucose: No results found for this or any previous visit (from the past 24 hour(s)).    Social History   Tobacco Use  Smoking Status Former   Packs/day: 1.00   Years: 33.00   Pack years: 33.00   Types: Cigarettes   Quit date: 04/06/2000   Years since quitting: 21.4  Smokeless Tobacco Never  Tobacco Comments   use to chew cigars    Goals Met:  Independence with exercise equipment Exercise tolerated well No report of concerns or symptoms today Strength training completed today  Goals Unmet:  Not Applicable  Comments: check out 1430   Dr. Kathie Dike is Medical Director for [facility].

## 2021-09-21 ENCOUNTER — Encounter (HOSPITAL_COMMUNITY)
Admission: RE | Admit: 2021-09-21 | Discharge: 2021-09-21 | Disposition: A | Discharge status: Home / Self Care | Payer: No Typology Code available for payment source | Source: Ambulatory Visit | Attending: *Deleted | Admitting: *Deleted

## 2021-09-21 VITALS — Wt 273.6 lb

## 2021-09-21 DIAGNOSIS — J449 Chronic obstructive pulmonary disease, unspecified: Secondary | ICD-10-CM

## 2021-09-21 NOTE — Progress Notes (Signed)
Daily Session Note  Patient Details  Name: Ricky D Gartrell Sr. MRN: [id number] Date of Birth: 11/30/1950 Referring Provider:   Flowsheet Row PULMONARY REHAB COPD ORIENTATION from 06/30/2021 in [place] [facility]  Referring Provider Dr. Welty-Wolf       Encounter Date: 09/21/2021  Check In:  Session Check In - 09/21/21 1330       Check-In   Supervising physician immediately available to respond to emergencies [facility] immediately available    Physician(s) Dr. Branch    Location [facility]    Staff Present Phyllis Billingsley, RN;Dalton Fletcher, MS, ACSM-CEP, Exercise Physiologist    Virtual Visit No    Medication changes reported     No    Fall or balance concerns reported    Yes    Comments using rollator for balance    Tobacco Cessation No Change    Warm-up and Cool-down Performed as group-led instruction    Resistance Training Performed Yes    VAD Patient? No    PAD/SET Patient? No      Pain Assessment   Currently in Pain? No/denies    Pain Score 0-No pain    Multiple Pain Sites No             Capillary Blood Glucose: No results found for this or any previous visit (from the past 24 hour(s)).    Social History   Tobacco Use  Smoking Status Former   Packs/day: 1.00   Years: 33.00   Pack years: 33.00   Types: Cigarettes   Quit date: 04/06/2000   Years since quitting: 21.4  Smokeless Tobacco Never  Tobacco Comments   use to chew cigars    Goals Met:  Proper associated with RPD/PD & O2 Sat Independence with exercise equipment Improved SOB with ADL's Exercise tolerated well No report of concerns or symptoms today Strength training completed today  Goals Unmet:  Not Applicable  Comments: check out @ 2:30pm   Dr. Jehanzeb Memon is Medical Director for [facility]. 

## 2021-09-23 ENCOUNTER — Encounter (HOSPITAL_COMMUNITY): Payer: No Typology Code available for payment source

## 2021-09-28 ENCOUNTER — Encounter (HOSPITAL_COMMUNITY)
Admission: RE | Admit: 2021-09-28 | Discharge: 2021-09-28 | Disposition: A | Discharge status: Home / Self Care | Payer: No Typology Code available for payment source | Source: Ambulatory Visit | Attending: *Deleted | Admitting: *Deleted

## 2021-09-28 DIAGNOSIS — J449 Chronic obstructive pulmonary disease, unspecified: Secondary | ICD-10-CM | POA: Insufficient documentation

## 2021-09-28 NOTE — Progress Notes (Signed)
Daily Session Note  Patient Details  Name: Ricky PATTI Sr. MRN: [id number] Date of Birth: 11-17-1951 Referring Provider:   Flowsheet Row PULMONARY REHAB COPD ORIENTATION from 06/30/2021 in [place]  Referring Provider Dr. Lula Olszewski       Encounter Date: 09/28/2021  Check In:  Session Check In - 09/28/21 1330       Check-In   Supervising physician immediately available to respond to emergencies [facility] immediately available    Physician(s) Dr. Domenic Polite    Location [facility]    Staff Present Geanie Cooley, RN;Other;Dalton Kris Mouton, MS, ACSM-CEP, Exercise Physiologist    Virtual Visit No    Medication changes reported     No    Fall or balance concerns reported    Yes    Comments using rollator for balance    Tobacco Cessation No Change    Warm-up and Cool-down Performed as group-led instruction    Resistance Training Performed Yes    VAD Patient? No    PAD/SET Patient? No      Pain Assessment   Currently in Pain? No/denies    Pain Score 0-No pain    Multiple Pain Sites No             Capillary Blood Glucose: No results found for this or any previous visit (from the past 24 hour(s)).    Social History   Tobacco Use  Smoking Status Former   Packs/day: 1.00   Years: 33.00   Pack years: 33.00   Types: Cigarettes   Quit date: 04/06/2000   Years since quitting: 21.4  Smokeless Tobacco Never  Tobacco Comments   use to chew cigars    Goals Met:  Proper associated with RPD/PD & O2 Sat Independence with exercise equipment Improved SOB with ADL's Exercise tolerated well No report of concerns or symptoms today Strength training completed today  Goals Unmet:  Not Applicable  Comments: check out @ 2:30pm   Dr. Kathie Dike is Medical Director for [facility] Pul[facility] Re[facility].

## 2021-09-30 ENCOUNTER — Encounter (HOSPITAL_COMMUNITY): Payer: No Typology Code available for payment source

## 2021-10-05 ENCOUNTER — Encounter (HOSPITAL_COMMUNITY)
Admission: RE | Admit: 2021-10-05 | Discharge: 2021-10-05 | Disposition: A | Discharge status: Home / Self Care | Payer: No Typology Code available for payment source | Source: Ambulatory Visit | Attending: *Deleted | Admitting: *Deleted

## 2021-10-05 VITALS — Wt 275.8 lb

## 2021-10-05 DIAGNOSIS — J449 Chronic obstructive pulmonary disease, unspecified: Secondary | ICD-10-CM | POA: Diagnosis not present

## 2021-10-05 NOTE — Progress Notes (Signed)
Daily Session Note  Patient Details  Name: Ricky APFEL Sr. MRN: [id number] Date of Birth: 12/14/1950 Referring Provider:   Flowsheet Row PULMONARY REHAB COPD ORIENTATION from 06/30/2021 in [place]  Referring Provider Dr. Lula Olszewski       Encounter Date: 10/05/2021  Check In:  Session Check In - 10/05/21 1330       Check-In   Supervising physician immediately available to respond to emergencies [facility] immediately available    Physician(s) Dr. Harl Bowie    Location [facility]    Staff Present Geanie Cooley, RN;Dalton Kris Mouton, MS, ACSM-CEP, Exercise Physiologist;Heather Zigmund Daniel, Exercise Physiologist    Virtual Visit No    Medication changes reported     No    Fall or balance concerns reported    Yes    Comments using rollator for balance    Tobacco Cessation No Change    Warm-up and Cool-down Performed as group-led instruction    Resistance Training Performed Yes    VAD Patient? No    PAD/SET Patient? No      Pain Assessment   Currently in Pain? No/denies    Pain Score 0-No pain    Multiple Pain Sites No             Capillary Blood Glucose: No results found for this or any previous visit (from the past 24 hour(s)).    Social History   Tobacco Use  Smoking Status Former   Packs/day: 1.00   Years: 33.00   Pack years: 33.00   Types: Cigarettes   Quit date: 04/06/2000   Years since quitting: 21.5  Smokeless Tobacco Never  Tobacco Comments   use to chew cigars    Goals Met:  Proper associated with RPD/PD & O2 Sat Independence with exercise equipment Improved SOB with ADL's Exercise tolerated well No report of concerns or symptoms today Strength training completed today  Goals Unmet:  Not Applicable  Comments: check out @ 2:30pm   Dr. Kathie Dike is Medical Director for [facility] Pul[facility].

## 2021-10-06 NOTE — Progress Notes (Signed)
Pulmonary Individual Treatment Plan  Patient Details  Name: Ricky BARIS Sr. MRN: [id number] Date of Birth: 21-Apr-1951 Referring Provider:   Flowsheet Row PULMONARY REHAB COPD ORIENTATION from 06/30/2021 in [place]  Referring Provider Dr. Lula Olszewski       Initial Encounter Date:  Flowsheet Row PULMONARY REHAB COPD ORIENTATION from 06/30/2021 in [place]  Date 06/30/21       Visit Diagnosis: Chronic obstructive pulmonary disease, unspecified COPD type ([place])  Patient's Home Medications on Admission:   Current Outpatient Medications:    acetaminophen (TYLENOL) 325 MG tablet, Take 3 tablets (975 mg total) by mouth every 8 (eight) hours as needed for mild pain. (Patient taking differently: Take 975 mg by mouth 2 (two) times daily as needed for mild pain. ), Disp: , Rfl:    albuterol (PROVENTIL HFA;VENTOLIN HFA) 108 (90 Base) MCG/ACT inhaler, Inhale 2 puffs into the lungs every 6 (six) hours as needed for wheezing or shortness of breath., Disp: , Rfl:    ammonium lactate (LAC-HYDRIN) 12 % lotion, Apply 1 application topically daily as needed for dry skin., Disp: , Rfl:    aspirin 81 MG chewable tablet, Chew 1 tablet (81 mg total) by mouth 2 (two) times daily after a meal., Disp: 60 tablet, Rfl: 0   atorvastatin (LIPITOR) 80 MG tablet, Take 80 mg by mouth at bedtime., Disp: , Rfl:    cetirizine (ZYRTEC) 10 MG tablet, Take 10 mg by mouth daily as needed for allergies., Disp: , Rfl:    cholecalciferol (VITAMIN D) 1000 UNITS tablet, Take 1,000 Units by mouth daily. , Disp: , Rfl:    diclofenac Sodium (VOLTAREN) 1 % GEL, Apply topically., Disp: , Rfl:    empagliflozin (JARDIANCE) 25 MG TABS tablet, Take 25 mg by mouth daily., Disp: , Rfl:    ferrous sulfate 325 (65 FE) MG tablet, Take 325 mg by mouth daily. , Disp: , Rfl:    fluticasone (FLONASE) 50 MCG/ACT nasal spray, , Disp: , Rfl:    Fluticasone-Salmeterol (ADVAIR) 250-50 MCG/DOSE AEPB,  INHALE 1 PUFF BY ORAL INHALATION TWO TIMES A DAY **REPLACES SYMBICORT INHALER**, Disp: , Rfl:    furosemide (LASIX) 20 MG tablet, Take 40 mg by mouth daily. , Disp: , Rfl:    gabapentin (NEURONTIN) 300 MG capsule, Take 600 mg by mouth 2 (two) times daily. , Disp: , Rfl:    HUMULIN R U-500 KWIKPEN 500 UNIT/ML kwikpen, 3 (three) times daily with meals. 110 unit with breakfast 110 units with lunch 50-60 units with supper, Disp: , Rfl:    hydrocortisone 2.5 % lotion, Apply topically., Disp: , Rfl:    ketoconazole (NIZORAL) 2 % shampoo, Apply topically., Disp: , Rfl:    losartan (COZAAR) 25 MG tablet, Take 25 mg by mouth daily., Disp: , Rfl:    metFORMIN (GLUCOPHAGE) 1000 MG tablet, Take 1,000 mg by mouth 2 (two) times daily with a meal., Disp: , Rfl:    omeprazole (PRILOSEC) 20 MG capsule, Take 20 mg by mouth 2 (two) times daily before a meal., Disp: , Rfl:    OXYGEN, Inhale 1 L into the lungs continuous. At night time may increase to 2 L as needed for shortness of breath, Disp: , Rfl:    rOPINIRole (REQUIP) 0.5 MG tablet, Take 1 tablet by mouth at bedtime., Disp: , Rfl:    rOPINIRole (REQUIP) 1 MG tablet, Take 1 tablet by mouth at bedtime., Disp: , Rfl:    selenium sulfide (SELSUN) 2.5 %  shampoo, Apply topically., Disp: , Rfl:    SEMAGLUTIDE, 1 MG/DOSE, [place], Inject 1 mg into the skin once a week., Disp: , Rfl:    sodium chloride (ALTAMIST SPRAY) 0.65 % nasal spray, Place into the nose., Disp: , Rfl:    tamsulosin (FLOMAX) 0.4 MG CAPS capsule, Take 0.4 mg by mouth daily., Disp: , Rfl:    tiotropium (SPIRIVA HANDIHALER) 18 MCG inhalation capsule, Place 18 mcg into inhaler and inhale daily., Disp: , Rfl:   Past Medical History: Past Medical History:  Diagnosis Date   Arthritis    Cancer ([place])    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   Complication of anesthesia    hard for him to wake up from Anesthesia from left nephrectomy   COPD (chronic obstructive pulmonary disease) ([place])    Diabetes mellitus  without complication ([place])    DVT (deep venous thrombosis) ([place]) 01/08/2014, 2010   upper extremity   GERD (gastroesophageal reflux disease)    Hallux limitus 05/18/2015   from notes from [facility]    Headache    migraines   History of kidney stones    Hypercholesterolemia    Hypertension    Impaired hearing    Intervertebral disc syndrome    Kidney stones    Pulmonary fibrosis ([place])    Renal calculi 01/08/2014   frrom noted from [facility] .in chart   Restless legs 01/08/2014   Sciatic leg pain    paralysis of sciatic nerve   Sleep apnea    CPAP/BIPAP   Tinnitus     Tobacco Use: Social History   Tobacco Use  Smoking Status Former   Packs/day: 1.00   Years: 33.00   Pack years: 33.00   Types: Cigarettes   Quit date: 04/06/2000   Years since quitting: 21.5  Smokeless Tobacco Never  Tobacco Comments   use to chew cigars    Labs: Recent Review Flowsheet Data     Labs for ITP Cardiac and Pulmonary Rehab Latest Ref Rng & Units 01/08/2015 02/10/2017 02/10/2017 02/11/2017   Hemoglobin A1c 4.8 - 5.6 % 8.1(H) - - -   PHART 7.350 - 7.450 - 7.181(LL) 7.217(L) 7.298(L)   PCO2ART 32.0 - 48.0 mmHg - 82.8(HH) 73.1(HH) 62.5(H)   HCO3 20.0 - 28.0 mmol/L - 29.8(H) 28.6(H) 29.4(H)   ACIDBASEDEF 0.0 - 2.0 mmol/L - 0.7 0.8 -   O2SAT % - 98.5 93.5 96.1       Capillary Blood Glucose: Lab Results  Component Value Date   GLUCAP 228 (H) 06/30/2021   GLUCAP 181 (H) 09/07/2018   GLUCAP 91 08/24/2018   GLUCAP 119 (H) 08/24/2018   GLUCAP 339 (H) 08/22/2018     Pulmonary Assessment Scores:  Pulmonary Assessment Scores     Row Name 06/30/21 1426         ADL UCSD   ADL Phase Entry     SOB Score total 30     Rest 2     Walk 3     Stairs 5     Bath 3     Dress 3     Shop 0       CAT Score   CAT Score 26       mMRC Score   mMRC Score 4             UCSD: Self-administered rating of dyspnea associated with activities of daily living (ADLs) 6-point scale (0 = "not at  all" to 5 = "maximal or unable to do  because of breathlessness")  Scoring Scores range from 0 to 120.  Minimally important difference is 5 units  CAT: CAT can identify the health impairment of COPD patients and is better correlated with disease progression.  CAT has a scoring range of zero to 40. The CAT score is classified into four groups of low (less than 10), medium (10 - 20), high (21-30) and very high (31-40) based on the impact level of disease on health status. A CAT score over 10 suggests significant symptoms.  A worsening CAT score could be explained by an exacerbation, poor medication adherence, poor inhaler technique, or progression of COPD or comorbid conditions.  CAT MCID is 2 points  mMRC: mMRC (Modified Medical Research Council) Dyspnea Scale is used to assess the degree of baseline functional disability in patients of respiratory disease due to dyspnea. No minimal important difference is established. A decrease in score of 1 point or greater is considered a positive change.   Pulmonary Function Assessment:   Exercise Target Goals: Exercise Program Goal: Individual exercise prescription set using results from initial 6 min walk test and THRR while considering  patient's activity barriers and safety.   Exercise Prescription Goal: Initial exercise prescription builds to 30-45 minutes a day of aerobic activity, 2-3 days per week.  Home exercise guidelines will be given to patient during program as part of exercise prescription that the participant will acknowledge.  Activity Barriers & Risk Stratification:  Activity Barriers & Cardiac Risk Stratification - 06/30/21 1307       Activity Barriers & Cardiac Risk Stratification   Activity Barriers Arthritis;Back Problems;Left Hip Replacement;Joint Problems;Deconditioning;Shortness of Breath;History of Falls;Assistive Device;Balance Concerns    Cardiac Risk Stratification Low             6 Minute Walk:  6 Minute Walk      Row Name 06/30/21 1435         6 Minute Walk   Phase Initial     Distance 550 feet     Walk Time 6 minutes     # of Rest Breaks 2     MPH 1     METS 0.96     RPE 13     Perceived Dyspnea  15     VO2 Peak 3.35     Symptoms Yes (comment)     Comments used rollator. Two standing breaks due to SOB     Resting HR 71 bpm     Resting BP 126/70     Resting Oxygen Saturation  96 %     Exercise Oxygen Saturation  during 6 min walk 93 %     Max Ex. HR 106 bpm     Max Ex. BP 154/60     2 Minute Post BP 126/68       Interval HR   1 Minute HR 92     2 Minute HR 100     3 Minute HR 96     4 Minute HR 106     5 Minute HR 99     6 Minute HR 98     2 Minute Post HR 82     Interval Heart Rate? Yes       Interval Oxygen   Interval Oxygen? Yes     Baseline Oxygen Saturation % 96 %     1 Minute Oxygen Saturation % 96 %     1 Minute Liters of Oxygen 4 L     2 Minute Oxygen Saturation %  95 %     2 Minute Liters of Oxygen 4 L     3 Minute Oxygen Saturation % 93 %     3 Minute Liters of Oxygen 4 L     4 Minute Oxygen Saturation % 95 %     4 Minute Liters of Oxygen 4 L     5 Minute Oxygen Saturation % 94 %     5 Minute Liters of Oxygen 4 L     6 Minute Oxygen Saturation % 94 %     6 Minute Liters of Oxygen 4 L     2 Minute Post Oxygen Saturation % 97 %     2 Minute Post Liters of Oxygen 4 L              Oxygen Initial Assessment:  Oxygen Initial Assessment - 06/30/21 1308       Home Oxygen   Home Oxygen Device Home Concentrator;E-Tanks    Sleep Oxygen Prescription BiPAP    Liters per minute 3    Home Resting Oxygen Prescription Continuous    Liters per minute 3      Initial 6 min Walk   Oxygen Used Continuous    Liters per minute 4      Intervention   Short Term Goals To learn and exhibit compliance with exercise, home and travel O2 prescription;To learn and understand importance of monitoring SPO2 with pulse oximeter and demonstrate accurate use of the pulse  oximeter.;To learn and understand importance of maintaining oxygen saturations>88%;To learn and demonstrate proper pursed lip breathing techniques or other breathing techniques.     Long  Term Goals Exhibits compliance with exercise, home  and travel O2 prescription;Verbalizes importance of monitoring SPO2 with pulse oximeter and return demonstration;Maintenance of O2 saturations>88%;Exhibits proper breathing techniques, such as pursed lip breathing or other method taught during program session             Oxygen Re-Evaluation:  Oxygen Re-Evaluation     Row Name 07/13/21 1437 08/10/21 1643 09/07/21 1536 10/05/21 1443       Program Oxygen Prescription   Program Oxygen Prescription Continuous;E-Tanks Continuous;E-Tanks Continuous;E-Tanks Continuous;E-Tanks    Liters per minute 4 4 3 3       Home Oxygen   Home Oxygen Device Home Concentrator;E-Tanks Home Concentrator;E-Tanks Home Concentrator;E-Tanks Home Concentrator;E-Tanks    Sleep Oxygen Prescription BiPAP BiPAP BiPAP BiPAP    Liters per minute 3 3 3 3     Home Exercise Oxygen Prescription Continuous Continuous Continuous Continuous    Liters per minute 3 3 3 3     Home Resting Oxygen Prescription Continuous Continuous Continuous Continuous    Liters per minute 3 3 3 3     Compliance with Home Oxygen Use Yes Yes Yes Yes      Goals/Expected Outcomes   Short Term Goals To learn and exhibit compliance with exercise, home and travel O2 prescription;To learn and understand importance of monitoring SPO2 with pulse oximeter and demonstrate accurate use of the pulse oximeter.;To learn and understand importance of maintaining oxygen saturations>88%;To learn and demonstrate proper pursed lip breathing techniques or other breathing techniques.  To learn and exhibit compliance with exercise, home and travel O2 prescription;To learn and understand importance of monitoring SPO2 with pulse oximeter and demonstrate accurate use of the pulse oximeter.;To  learn and understand importance of maintaining oxygen saturations>88%;To learn and demonstrate proper pursed lip breathing techniques or other breathing techniques.  To learn and exhibit compliance with exercise, home and travel O2  prescription;To learn and understand importance of monitoring SPO2 with pulse oximeter and demonstrate accurate use of the pulse oximeter.;To learn and understand importance of maintaining oxygen saturations>88%;To learn and demonstrate proper pursed lip breathing techniques or other breathing techniques.  To learn and exhibit compliance with exercise, home and travel O2 prescription;To learn and understand importance of monitoring SPO2 with pulse oximeter and demonstrate accurate use of the pulse oximeter.;To learn and understand importance of maintaining oxygen saturations>88%;To learn and demonstrate proper pursed lip breathing techniques or other breathing techniques.     Long  Term Goals Exhibits compliance with exercise, home  and travel O2 prescription;Verbalizes importance of monitoring SPO2 with pulse oximeter and return demonstration;Maintenance of O2 saturations>88%;Exhibits proper breathing techniques, such as pursed lip breathing or other method taught during program session Exhibits compliance with exercise, home  and travel O2 prescription;Verbalizes importance of monitoring SPO2 with pulse oximeter and return demonstration;Maintenance of O2 saturations>88%;Exhibits proper breathing techniques, such as pursed lip breathing or other method taught during program session Verbalizes importance of monitoring SPO2 with pulse oximeter and return demonstration;Maintenance of O2 saturations>88%;Exhibits compliance with exercise, home  and travel O2 prescription;Exhibits proper breathing techniques, such as pursed lip breathing or other method taught during program session Verbalizes importance of monitoring SPO2 with pulse oximeter and return demonstration;Maintenance of O2  saturations>88%;Exhibits compliance with exercise, home  and travel O2 prescription;Exhibits proper breathing techniques, such as pursed lip breathing or other method taught during program session    Comments -- -- -- Pt has forgotten to turn on oxygen handful of times causing him to become SOB during class. Continuous montioring to make sure tank is turned on before class starts.    Goals/Expected Outcomes compliance compliance compliance compliance             Oxygen Discharge (Final Oxygen Re-Evaluation):  Oxygen Re-Evaluation - 10/05/21 1443       Program Oxygen Prescription   Program Oxygen Prescription Continuous;E-Tanks    Liters per minute 3      Home Oxygen   Home Oxygen Device Home Concentrator;E-Tanks    Sleep Oxygen Prescription BiPAP    Liters per minute 3    Home Exercise Oxygen Prescription Continuous    Liters per minute 3    Home Resting Oxygen Prescription Continuous    Liters per minute 3    Compliance with Home Oxygen Use Yes      Goals/Expected Outcomes   Short Term Goals To learn and exhibit compliance with exercise, home and travel O2 prescription;To learn and understand importance of monitoring SPO2 with pulse oximeter and demonstrate accurate use of the pulse oximeter.;To learn and understand importance of maintaining oxygen saturations>88%;To learn and demonstrate proper pursed lip breathing techniques or other breathing techniques.     Long  Term Goals Verbalizes importance of monitoring SPO2 with pulse oximeter and return demonstration;Maintenance of O2 saturations>88%;Exhibits compliance with exercise, home  and travel O2 prescription;Exhibits proper breathing techniques, such as pursed lip breathing or other method taught during program session    Comments Pt has forgotten to turn on oxygen handful of times causing him to become SOB during class. Continuous montioring to make sure tank is turned on before class starts.    Goals/Expected Outcomes  compliance             Initial Exercise Prescription:  Initial Exercise Prescription - 06/30/21 1400       Date of Initial Exercise RX and Referring Provider   Date 06/30/21    Referring  Provider Dr. Lula Olszewski    Expected Discharge Date 10/28/21      Oxygen   Oxygen Continuous    Liters 4      NuStep   Level 1    SPM 60    Minutes 39      Prescription Details   Frequency (times per week) 2    Duration Progress to 30 minutes of continuous aerobic without signs/symptoms of physical distress      Intensity   THRR 40-80% of Max Heartrate 60-120    Ratings of Perceived Exertion 11-13    Perceived Dyspnea 0-4      Resistance Training   Training Prescription Yes    Weight 3 lbs    Reps 10-15             Perform Capillary Blood Glucose checks as needed.  Exercise Prescription Changes:   Exercise Prescription Changes     Row Name 07/13/21 1400 07/22/21 1415 08/05/21 1330 08/24/21 1400 09/07/21 1500     Response to Exercise   Blood Pressure (Admit) 158/68 138/70 138/68 118/60 110/50   Blood Pressure (Exercise) 148/66 142/72 160/72 125/65 148/78   Blood Pressure (Exit) 118/62 100/70 132/68 120/62 120/58   Heart Rate (Admit) 92 bpm 62 bpm 91 bpm 85 bpm 95 bpm   Heart Rate (Exercise) 104 bpm 93 bpm 112 bpm 104 bpm 103 bpm   Heart Rate (Exit) 101 bpm 93 bpm 100 bpm 95 bpm 98 bpm   Oxygen Saturation (Admit) 95 % 97 % 98 % 96 % 97 %   Oxygen Saturation (Exercise) 93 % 95 % 96 % 92 % 94 %   Oxygen Saturation (Exit) 94 % 96 % 95 % 98 % 96 %   Rating of Perceived Exertion (Exercise) 12 11 12 11 11    Perceived Dyspnea (Exercise) 12 11 12 9 9    Duration Continue with 30 min of aerobic exercise without signs/symptoms of physical distress. Continue with 30 min of aerobic exercise without signs/symptoms of physical distress. Continue with 30 min of aerobic exercise without signs/symptoms of physical distress. Continue with 30 min of aerobic exercise without signs/symptoms  of physical distress. Continue with 30 min of aerobic exercise without signs/symptoms of physical distress.   Intensity THRR unchanged THRR unchanged THRR unchanged THRR unchanged THRR unchanged     Progression   Progression Continue to progress workloads to maintain intensity without signs/symptoms of physical distress. Continue to progress workloads to maintain intensity without signs/symptoms of physical distress. Continue to progress workloads to maintain intensity without signs/symptoms of physical distress. Continue to progress workloads to maintain intensity without signs/symptoms of physical distress. Continue to progress workloads to maintain intensity without signs/symptoms of physical distress.     Resistance Training   Training Prescription Yes Yes Yes Yes Yes   Weight 4 lbs 4 lbs 5 lbs 5 5   Reps 10-15 10-15 10-15 10-15 10-15   Time 10 Minutes 10 Minutes 10 Minutes 10 Minutes 10 Minutes     Oxygen   Oxygen Continuous Continuous Continuous Continuous Continuous   Liters 4 4 4 3 3      NuStep   Level 1 3 2 2 4    SPM 89 70 82 80 61   Minutes 39 39 39 39 17   METs 2 2 2 2  2.1     Arm Ergometer   Level -- -- -- -- 4   Minutes -- -- -- -- 22   METs -- -- -- -- 1.9  [place] Name 09/21/21 1400 10/05/21 1400           Response to Exercise   Blood Pressure (Admit) 132/68 132/62      Blood Pressure (Exercise) 140/60 158/68      Blood Pressure (Exit) 100/56 120/64      Heart Rate (Admit) 94 bpm 98 bpm      Heart Rate (Exercise) 104 bpm 109 bpm      Heart Rate (Exit) 97 bpm 100 bpm      Oxygen Saturation (Admit) 95 % 96 %      Oxygen Saturation (Exercise) 94 % 94 %      Oxygen Saturation (Exit) 97 % 96 %      Rating of Perceived Exertion (Exercise) 9 9      Perceived Dyspnea (Exercise) 9 9      Duration Continue with 30 min of aerobic exercise without signs/symptoms of physical distress. Continue with 30 min of aerobic exercise without signs/symptoms of physical distress.       Intensity THRR unchanged THRR unchanged        Progression   Progression Continue to progress workloads to maintain intensity without signs/symptoms of physical distress. Continue to progress workloads to maintain intensity without signs/symptoms of physical distress.        Resistance Training   Training Prescription Yes Yes      Weight 3 lbs 3 lbs      Reps 10-15 10-15      Time 10 Minutes 10 Minutes        Oxygen   Oxygen Continuous Continuous      Liters 3 3        NuStep   Level 2 3      SPM 85 92      Minutes 17 17      METs 2.1 2.2        Arm Ergometer   Level 4 4      Minutes 22 22      METs 1.4 2               Exercise Comments:   Exercise Goals and Review:   Exercise Goals     Row Name 06/30/21 1438 07/13/21 1440 08/10/21 1645 09/07/21 1534       Exercise Goals   Increase Physical Activity Yes Yes Yes Yes    Intervention Provide advice, education, support and counseling about physical activity/exercise needs.;Develop an individualized exercise prescription for aerobic and resistive training based on initial evaluation findings, risk stratification, comorbidities and participant's personal goals. Provide advice, education, support and counseling about physical activity/exercise needs.;Develop an individualized exercise prescription for aerobic and resistive training based on initial evaluation findings, risk stratification, comorbidities and participant's personal goals. Provide advice, education, support and counseling about physical activity/exercise needs.;Develop an individualized exercise prescription for aerobic and resistive training based on initial evaluation findings, risk stratification, comorbidities and participant's personal goals. Provide advice, education, support and counseling about physical activity/exercise needs.;Develop an individualized exercise prescription for aerobic and resistive training based on initial evaluation findings, risk  stratification, comorbidities and participant's personal goals.    Expected Outcomes Short Term: Attend rehab on a regular basis to increase amount of physical activity.;Long Term: Add in home exercise to make exercise part of routine and to increase amount of physical activity.;Long Term: Exercising regularly at least 3-5 days a week. Short Term: Attend rehab on a regular basis to increase amount of physical activity.;Long Term: Add in home exercise to make  exercise part of routine and to increase amount of physical activity.;Long Term: Exercising regularly at least 3-5 days a week. Short Term: Attend rehab on a regular basis to increase amount of physical activity.;Long Term: Add in home exercise to make exercise part of routine and to increase amount of physical activity.;Long Term: Exercising regularly at least 3-5 days a week. Short Term: Attend rehab on a regular basis to increase amount of physical activity.;Long Term: Add in home exercise to make exercise part of routine and to increase amount of physical activity.;Long Term: Exercising regularly at least 3-5 days a week.    Increase Strength and Stamina Yes Yes Yes Yes    Intervention Provide advice, education, support and counseling about physical activity/exercise needs.;Develop an individualized exercise prescription for aerobic and resistive training based on initial evaluation findings, risk stratification, comorbidities and participant's personal goals. Provide advice, education, support and counseling about physical activity/exercise needs.;Develop an individualized exercise prescription for aerobic and resistive training based on initial evaluation findings, risk stratification, comorbidities and participant's personal goals. Provide advice, education, support and counseling about physical activity/exercise needs.;Develop an individualized exercise prescription for aerobic and resistive training based on initial evaluation findings, risk  stratification, comorbidities and participant's personal goals. Provide advice, education, support and counseling about physical activity/exercise needs.;Develop an individualized exercise prescription for aerobic and resistive training based on initial evaluation findings, risk stratification, comorbidities and participant's personal goals.    Expected Outcomes Short Term: Increase workloads from initial exercise prescription for resistance, speed, and METs.;Short Term: Perform resistance training exercises routinely during rehab and add in resistance training at home;Long Term: Improve cardiorespiratory fitness, muscular endurance and strength as measured by increased METs and functional capacity (6MWT) Short Term: Increase workloads from initial exercise prescription for resistance, speed, and METs.;Short Term: Perform resistance training exercises routinely during rehab and add in resistance training at home;Long Term: Improve cardiorespiratory fitness, muscular endurance and strength as measured by increased METs and functional capacity (6MWT) Short Term: Increase workloads from initial exercise prescription for resistance, speed, and METs.;Short Term: Perform resistance training exercises routinely during rehab and add in resistance training at home;Long Term: Improve cardiorespiratory fitness, muscular endurance and strength as measured by increased METs and functional capacity (6MWT) Short Term: Increase workloads from initial exercise prescription for resistance, speed, and METs.;Short Term: Perform resistance training exercises routinely during rehab and add in resistance training at home;Long Term: Improve cardiorespiratory fitness, muscular endurance and strength as measured by increased METs and functional capacity (6MWT)    Able to understand and use rate of perceived exertion (RPE) scale Yes Yes Yes Yes    Intervention Provide education and explanation on how to use RPE scale Provide education and  explanation on how to use RPE scale Provide education and explanation on how to use RPE scale Provide education and explanation on how to use RPE scale    Expected Outcomes Short Term: Able to use RPE daily in rehab to express subjective intensity level;Long Term:  Able to use RPE to guide intensity level when exercising independently Short Term: Able to use RPE daily in rehab to express subjective intensity level;Long Term:  Able to use RPE to guide intensity level when exercising independently Short Term: Able to use RPE daily in rehab to express subjective intensity level;Long Term:  Able to use RPE to guide intensity level when exercising independently Short Term: Able to use RPE daily in rehab to express subjective intensity level;Long Term:  Able to use RPE to guide intensity level  when exercising independently    Able to understand and use Dyspnea scale Yes Yes Yes Yes    Intervention Provide education and explanation on how to use Dyspnea scale Provide education and explanation on how to use Dyspnea scale Provide education and explanation on how to use Dyspnea scale Provide education and explanation on how to use Dyspnea scale    Expected Outcomes Short Term: Able to use Dyspnea scale daily in rehab to express subjective sense of shortness of breath during exertion;Long Term: Able to use Dyspnea scale to guide intensity level when exercising independently Short Term: Able to use Dyspnea scale daily in rehab to express subjective sense of shortness of breath during exertion;Long Term: Able to use Dyspnea scale to guide intensity level when exercising independently Short Term: Able to use Dyspnea scale daily in rehab to express subjective sense of shortness of breath during exertion;Long Term: Able to use Dyspnea scale to guide intensity level when exercising independently Short Term: Able to use Dyspnea scale daily in rehab to express subjective sense of shortness of breath during exertion;Long Term: Able  to use Dyspnea scale to guide intensity level when exercising independently    Knowledge and understanding of Target Heart Rate Range (THRR) Yes Yes Yes Yes    Intervention Provide education and explanation of THRR including how the numbers were predicted and where they are located for reference Provide education and explanation of THRR including how the numbers were predicted and where they are located for reference Provide education and explanation of THRR including how the numbers were predicted and where they are located for reference Provide education and explanation of THRR including how the numbers were predicted and where they are located for reference    Expected Outcomes Short Term: Able to state/look up THRR;Short Term: Able to use daily as guideline for intensity in rehab;Long Term: Able to use THRR to govern intensity when exercising independently Short Term: Able to state/look up THRR;Short Term: Able to use daily as guideline for intensity in rehab;Long Term: Able to use THRR to govern intensity when exercising independently Short Term: Able to state/look up THRR;Short Term: Able to use daily as guideline for intensity in rehab;Long Term: Able to use THRR to govern intensity when exercising independently Short Term: Able to state/look up THRR;Short Term: Able to use daily as guideline for intensity in rehab;Long Term: Able to use THRR to govern intensity when exercising independently    Understanding of Exercise Prescription Yes Yes Yes Yes    Intervention Provide education, explanation, and written materials on patient's individual exercise prescription Provide education, explanation, and written materials on patient's individual exercise prescription Provide education, explanation, and written materials on patient's individual exercise prescription Provide education, explanation, and written materials on patient's individual exercise prescription    Expected Outcomes Short Term: Able to  explain program exercise prescription;Long Term: Able to explain home exercise prescription to exercise independently Short Term: Able to explain program exercise prescription;Long Term: Able to explain home exercise prescription to exercise independently Short Term: Able to explain program exercise prescription;Long Term: Able to explain home exercise prescription to exercise independently Short Term: Able to explain program exercise prescription;Long Term: Able to explain home exercise prescription to exercise independently             Exercise Goals Re-Evaluation :  Exercise Goals Re-Evaluation     Row Name 07/13/21 1440 08/10/21 1645 09/07/21 1534 10/05/21 1440       Exercise Goal Re-Evaluation   Exercise Goals  Review Increase Physical Activity;Increase Strength and Stamina;Able to understand and use rate of perceived exertion (RPE) scale;Knowledge and understanding of Target Heart Rate Range (THRR);Able to check pulse independently;Understanding of Exercise Prescription Increase Physical Activity;Increase Strength and Stamina;Able to understand and use rate of perceived exertion (RPE) scale;Knowledge and understanding of Target Heart Rate Range (THRR);Able to check pulse independently;Understanding of Exercise Prescription Increase Physical Activity;Increase Strength and Stamina;Able to understand and use rate of perceived exertion (RPE) scale;Knowledge and understanding of Target Heart Rate Range (THRR);Able to check pulse independently;Understanding of Exercise Prescription Increase Physical Activity;Increase Strength and Stamina;Able to understand and use rate of perceived exertion (RPE) scale;Knowledge and understanding of Target Heart Rate Range (THRR);Able to check pulse independently;Understanding of Exercise Prescription    Comments Pt has completed 3 exercise sessions. He is deconditioned and limited by right hip pain, but he does give good effort while he is in rehab. He is currently  exercising at 2.0 METs on the stepper. Will continue to monitor and progress as able. Pt has completed 8 exercise sessions. He has made some progress but has had to miss several sessions due to other appointments and health problems. He is currently exercising at 2.0 METs on the stepper. Will continue to monitor and progress as able. Pt has completed 15 sessions of cardiac rehab. He is progressing and increasing his workload. He is currently exercising at 2.1 METs on the stepper. Will continue to monitor and progress as able. Pt has completed 20 sessions of pulmonary rehab. He is progressing and continues to work during class. He is currently exercising at 2.2 METs on  the stepper. Will continue to montior and progress a s able.    Expected Outcomes Through exercise at rehab and at home, the patient will meet their stated goals. Through exercise at rehab and at home, the patient will meet their stated goals. Through exercise at rehab and at home, the patient will meet their stated goals. Through exercise at rehab and at home, the patient will meet their stated goals.             Discharge Exercise Prescription (Final Exercise Prescription Changes):  Exercise Prescription Changes - 10/05/21 1400       Response to Exercise   Blood Pressure (Admit) 132/62    Blood Pressure (Exercise) 158/68    Blood Pressure (Exit) 120/64    Heart Rate (Admit) 98 bpm    Heart Rate (Exercise) 109 bpm    Heart Rate (Exit) 100 bpm    Oxygen Saturation (Admit) 96 %    Oxygen Saturation (Exercise) 94 %    Oxygen Saturation (Exit) 96 %    Rating of Perceived Exertion (Exercise) 9    Perceived Dyspnea (Exercise) 9    Duration Continue with 30 min of aerobic exercise without signs/symptoms of physical distress.    Intensity THRR unchanged      Progression   Progression Continue to progress workloads to maintain intensity without signs/symptoms of physical distress.      Resistance Training   Training  Prescription Yes    Weight 3 lbs    Reps 10-15    Time 10 Minutes      Oxygen   Oxygen Continuous    Liters 3      NuStep   Level 3    SPM 92    Minutes 17    METs 2.2      Arm Ergometer   Level 4    Minutes 22    METs  2             Nutrition:  Target Goals: Understanding of nutrition guidelines, daily intake of sodium 1500mg , cholesterol 200mg , calories 30% from fat and 7% or less from saturated fats, daily to have 5 or more servings of fruits and vegetables.  Biometrics:  Pre Biometrics - 06/30/21 1439       Pre Biometrics   Height 5\' 8"  (1.727 m)    Weight 126.7 kg    Waist Circumference 55 inches    Hip Circumference 51 inches    Waist to Hip Ratio 1.08 %    BMI (Calculated) 42.48    Triceps Skinfold 7 mm    % Body Fat 37.3 %    Grip Strength 28 kg    Flexibility 0 in    Single Leg Stand 0 seconds              Nutrition Therapy Plan and Nutrition Goals:  Nutrition Therapy & Goals - 09/27/21 1417       Personal Nutrition Goals   Comments Will continue to provide healthy nutritional education through handout.  Weight remains about the same around 124kg.      Intervention Plan   Intervention Nutrition handout(s) given to patient.             Nutrition Assessments:  Nutrition Assessments - 06/30/21 1414       MEDFICTS Scores   Pre Score 127            MEDIFICTS Score Key: ?70 Need to make dietary changes  40-70 Heart Healthy Diet ? 40 Therapeutic Level Cholesterol Diet   Picture Your Plate Scores: <42 Unhealthy dietary pattern with much room for improvement. 41-50 Dietary pattern unlikely to meet recommendations for good health and room for improvement. 51-60 More healthful dietary pattern, with some room for improvement.  >60 Healthy dietary pattern, although there may be some specific behaviors that could be improved.    Nutrition Goals Re-Evaluation:   Nutrition Goals Discharge (Final Nutrition Goals  Re-Evaluation):   Psychosocial: Target Goals: Acknowledge presence or absence of significant depression and/or stress, maximize coping skills, provide positive support system. Participant is able to verbalize types and ability to use techniques and skills needed for reducing stress and depression.  Initial Review & Psychosocial Screening:  Initial Psych Review & Screening - 06/30/21 1448       Initial Review   Current issues with None Identified      Family Dynamics   Good Support System? Yes      Barriers   Psychosocial barriers to participate in program Psychosocial barriers identified (see note)      Screening Interventions   Interventions Encouraged to exercise;Program counselor consult;Provide feedback about the scores to participant;To provide support and resources with identified psychosocial needs    Expected Outcomes Short Term goal: Utilizing psychosocial counselor, staff and physician to assist with identification of specific Stressors or current issues interfering with healing process. Setting desired goal for each stressor or current issue identified.;Long Term Goal: Stressors or current issues are controlled or eliminated.;Short Term goal: Identification and review with participant of any Quality of Life or Depression concerns found by scoring the questionnaire.             Quality of Life Scores:  Quality of Life - 06/30/21 1434       Quality of Life   Select Quality of Life      Quality of Life Scores   Health/Function Pre  12.66 %    Socioeconomic Pre 11.8 %    Psych/Spiritual Pre 11.79 %    Family Pre 12.4 %    GLOBAL Pre 12.3 %            Scores of 19 and below usually indicate a poorer quality of life in these areas.  A difference of  2-3 points is a clinically meaningful difference.  A difference of 2-3 points in the total score of the Quality of Life Index has been associated with significant improvement in overall quality of life, self-image,  physical symptoms, and general health in studies assessing change in quality of life.   PHQ-9: Recent Review Flowsheet Data     Depression screen [facility] 2/9 06/30/2021 12/31/2018 09/28/2018 08/07/2018   Decreased Interest 0 0 0 1   Down, Depressed, Hopeless 0 0 0 2   PHQ - 2 Score 0 0 0 3   Altered sleeping 0 0 0 2   Tired, decreased energy 0 1 1 2    Change in appetite 1 0 0 0   Feeling bad or failure about yourself  0 0 0 0   Trouble concentrating 0 0 0 0   Moving slowly or fidgety/restless 0 0 0 0   Suicidal thoughts 0 0 0 0   PHQ-9 Score 1 1 1 7    Difficult doing work/chores Not difficult at all Not difficult at all Not difficult at all Very difficult      Interpretation of Total Score  Total Score Depression Severity:  1-4 = Minimal depression, 5-9 = Mild depression, 10-14 = Moderate depression, 15-19 = Moderately severe depression, 20-27 = Severe depression   Psychosocial Evaluation and Intervention:  Psychosocial Evaluation - 06/30/21 1449       Psychosocial Evaluation & Interventions   Interventions Encouraged to exercise with the program and follow exercise prescription;Stress management education;Relaxation education    Comments Patient has no psychosocial barriers identified to participate in PR. His initial PHQ-9 score was 1 and his QOL score 12.3% overall scoreing 11 and 12% in all categories. He denies any current or history of depression, anxiety, or stress. His wife is present with him today whom he says is his emotional support person. They both have children of their own but none together. He has 3 sons that all live out-of[place]state and do not contact him often. She has 2 daughters that live near and he sees and talks to them often. He has polyarthritis which limits his mobility along with COPD that prevents him from doing activities. His long term goal is to get off of oxygen. For the program, he would like for his SOB to improve and to get stronger to be able to do more  activities. He scored 127 on his diet assessment and stated he was not interested in changing his diet and that he "loves to eat". Difficult to measure his motivation to improve his health overall but he did demonstate an interest in participating in the program to hopefully meet his goals.    Expected Outcomes Patient will continue to have no psychosocial barriers identififed.    Continue Psychosocial Services  No Follow up required             Psychosocial Re-Evaluation:  Psychosocial Re-Evaluation     [place] Name 07/07/21 1150 08/05/21 1500 08/30/21 1211 09/27/21 1414       Psychosocial Re-Evaluation   Current issues with None Identified None Identified None Identified None Identified    Comments Patient  is new to the program. He continues to have no psychosocial barriers identified. He has completed 1 session. We will continue to montior his progress. Patient continues to have no psychosocial barriers identified. He has completed 8 sessions and seems to enjoy coming to the program. He is very interactive with staff and other patients in his class. He demonstrates a positive attitude and an interest in improving his health. We will continue to monitor. Patient continues to have no psychosocial barriers identified. He has completed 12 sessions and seems to enjoy coming to the program. He is very interactive with staff and other patients in his class. He demonstrates a positive outlook and attitude He continues to interact well with staff and class. We will continue to monitor. Patient continues to have no psychosocial barriers identified. He has completed 18 sessions and seems to enjoy coming to the program. He is very interactive with staff and other patients in his class. He demonstrates a positive outlook and attitude He continues to interact well with staff and class. We will continue to monitor.    Expected Outcomes Patient will continue to have no psychosocial barriers identified. Patient  will continue to have no psychosocial barriers identified. Patient will continue to have no psychosocial barriers identified. Patient will continue to have no psychosocial barriers identified.    Interventions Stress management education;Encouraged to attend Pulmonary Rehabilitation for the exercise;Relaxation education Stress management education;Encouraged to attend Pulmonary Rehabilitation for the exercise;Relaxation education Stress management education;Encouraged to attend Pulmonary Rehabilitation for the exercise Encouraged to attend Pulmonary Rehabilitation for the exercise;Stress management education    Continue Psychosocial Services  No Follow up required No Follow up required Follow up required by staff No Follow up required             Psychosocial Discharge (Final Psychosocial Re-Evaluation):  Psychosocial Re-Evaluation - 09/27/21 1414       Psychosocial Re-Evaluation   Current issues with None Identified    Comments Patient continues to have no psychosocial barriers identified. He has completed 18 sessions and seems to enjoy coming to the program. He is very interactive with staff and other patients in his class. He demonstrates a positive outlook and attitude He continues to interact well with staff and class. We will continue to monitor.    Expected Outcomes Patient will continue to have no psychosocial barriers identified.    Interventions Encouraged to attend Pulmonary Rehabilitation for the exercise;Stress management education    Continue Psychosocial Services  No Follow up required              Education: Education Goals: Education classes will be provided on a weekly basis, covering required topics. Participant will state understanding/return demonstration of topics presented.  Learning Barriers/Preferences:  Learning Barriers/Preferences - 06/30/21 1414       Learning Barriers/Preferences   Learning Barriers Hearing    Learning Preferences Individual  Instruction;Skilled Demonstration             Education Topics: How Lungs Work and Diseases: - Discuss the anatomy of the lungs and diseases that can affect the lungs, such as COPD. Flowsheet Row PULMONARY REHAB CHRONIC OBSTRUCTIVE PULMONARY DISEASE from 09/02/2021 in [place]  Date 09/02/21  Educator pb  Instruction Review Code 1- Verbalizes Understanding       Exercise: -Discuss the importance of exercise, FITT principles of exercise, normal and abnormal responses to exercise, and how to exercise safely.   Environmental Irritants: -Discuss types of environmental irritants and how to  limit exposure to environmental irritants. Flowsheet Row PULMONARY REHAB CHRONIC OBSTRUCTIVE PULMONARY DISEASE from 09/16/2021 in [place]  Date 09/09/21  Educator pb  Instruction Review Code 1- Verbalizes Understanding       Meds/Inhalers and oxygen: - Discuss respiratory medications, definition of an inhaler and oxygen, and the proper way to use an inhaler and oxygen. Flowsheet Row PULMONARY REHAB CHRONIC OBSTRUCTIVE PULMONARY DISEASE from 09/16/2021 in [place]  Date 09/16/21  Educator hj       Energy Saving Techniques: - Discuss methods to conserve energy and decrease shortness of breath when performing activities of daily living.    Bronchial Hygiene / Breathing Techniques: - Discuss breathing mechanics, pursed-lip breathing technique,  proper posture, effective ways to clear airways, and other functional breathing techniques   Cleaning Equipment: - Provides group verbal and written instruction about the health risks of elevated stress, cause of high stress, and healthy ways to reduce stress. Flowsheet Row PULMONARY REHAB CHRONIC OBSTRUCTIVE PULMONARY DISEASE from 09/16/2021 in [place]  Date 07/08/21  Educator DF  Instruction Review Code 2- Demonstrated Understanding        Nutrition I: Fats: - Discuss the types of cholesterol, what cholesterol does to the body, and how cholesterol levels can be controlled.   Nutrition II: Labels: -Discuss the different components of food labels and how to read food labels. Flowsheet Row PULMONARY REHAB CHRONIC OBSTRUCTIVE PULMONARY DISEASE from 09/16/2021 in [place]  Date 07/22/21  Educator pb  Instruction Review Code 1- Verbalizes Understanding       Respiratory Infections: - Discuss the signs and symptoms of respiratory infections, ways to prevent respiratory infections, and the importance of seeking medical treatment when having a respiratory infection. Flowsheet Row PULMONARY REHAB CHRONIC OBSTRUCTIVE PULMONARY DISEASE from 09/16/2021 in [place]  Date 07/29/21  Educator DJ  Instruction Review Code 1- Verbalizes Understanding       Stress I: Signs and Symptoms: - Discuss the causes of stress, how stress may lead to anxiety and depression, and ways to limit stress. Flowsheet Row PULMONARY REHAB CHRONIC OBSTRUCTIVE PULMONARY DISEASE from 09/16/2021 in [place]  Date 08/05/21  Educator pb  Instruction Review Code 1- Verbalizes Understanding       Stress II: Relaxation: -Discuss relaxation techniques to limit stress.   Oxygen for Home/Travel: - Discuss how to prepare for travel when on oxygen and proper ways to transport and store oxygen to ensure safety.   Knowledge Questionnaire Score:  Knowledge Questionnaire Score - 06/30/21 1448       Knowledge Questionnaire Score   Pre Score 15/18             Core Components/Risk Factors/Patient Goals at Admission:  Personal Goals and Risk Factors at Admission - 06/30/21 1416       Core Components/Risk Factors/Patient Goals on Admission    Weight Management Obesity    Improve shortness of breath with ADL's Yes    Intervention Provide education, individualized exercise  plan and daily activity instruction to help decrease symptoms of SOB with activities of daily living.    Expected Outcomes Short Term: Improve cardiorespiratory fitness to achieve a reduction of symptoms when performing ADLs;Long Term: Be able to perform more ADLs without symptoms or delay the onset of symptoms    Diabetes Yes    Intervention Provide education about signs/symptoms and action to take for hypo/hyperglycemia.;Provide education about proper nutrition, including hydration, and aerobic/resistive exercise prescription  along with prescribed medications to achieve blood glucose in normal ranges: Fasting glucose 65-99 mg/dL    Expected Outcomes Short Term: Participant verbalizes understanding of the signs/symptoms and immediate care of hyper/hypoglycemia, proper foot care and importance of medication, aerobic/resistive exercise and nutrition plan for blood glucose control.    Personal Goal Other Yes    Personal Goal Improve SOB. Get stronger and strenghten lungs to be able to do more activities.    Intervention Patient will attend PR 2 days/week and supplement with exercise 3 days/week.    Expected Outcomes Patient will complete the program and meet both personal and program.             Core Components/Risk Factors/Patient Goals Review:   Goals and Risk Factor Review     Row Name 07/07/21 1152 08/05/21 1501 08/30/21 1216 09/27/21 1420       Core Components/Risk Factors/Patient Goals Review   Personal Goals Review Weight Management/Obesity;Improve shortness of breath with ADL's;Diabetes;Hypertension;Stress Weight Management/Obesity;Improve shortness of breath with ADL's;Diabetes;Hypertension;Stress Weight Management/Obesity;Improve shortness of breath with ADL's;Diabetes;Hypertension;Stress Weight Management/Obesity;Improve shortness of breath with ADL's;Diabetes    Review Patient was referred to PR with COPD. He has completed 1 session. His goals for the program are to improve his  SOB; get stronger and be able to do more activities. We will continue to monitor his progress as he works towards meeting these goals. Patient has completed 8 sessions gaining 1 KG since his initial visit. He is doing well in the program with consistent attendance. He exercises on 4L O2 saturating at 94-96%. His blood pressure is above goal at times. No A1C on file and he does not always report his glucose readings. 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 His personal goals for the program are to improve his SOB; get stronger to be able to do more activities. We will continue to monitor his progress as he works towards meeting these goals. Patient has completed12 sessions. Weight remain consistance around 126kg.  He is doing well in the program with consistent attendance. He exercises on 4L O2 saturating at 92-96%. His blood pressure is above goal at times. No A1C on file and he does not always report his glucose readings. 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 His personal goals for the program are to improve his SOB; get stronger to be able to do more activities. We will continue to monitor his progress as he works towards meeting these goals. Patient has completed18 sessions. Weight remain consistance around 124kg.  He is doing well in the program with consistent attendance. He exercises on 4L O2 saturating at 93-95%.  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 His personal goals for the program are to improve his SOB; get stronger to be able to do more activities. We will continue to monitor his progress as he works towards meeting these goals.    Expected Outcomes Patient will complete the program meeting both program and personal goals. Patient will complete the program meeting both program and personal goals. Patient will complete the program meeting both program and personal goals. Patient will complete the program meeting both program and personal goals.              Core Components/Risk Factors/Patient Goals at Discharge (Final Review):   Goals and Risk Factor Review - 09/27/21 1420       Core Components/Risk Factors/Patient Goals Review   Personal  Goals Review Weight Management/Obesity;Improve shortness of breath with ADL's;Diabetes    Review Patient has completed18 sessions. Weight remain consistance around 124kg.  He is doing well in the program with consistent attendance. He exercises on 4L O2 saturating at 93-95%.  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 His personal goals for the program are to improve his SOB; get stronger to be able to do more activities. We will continue to monitor his progress as he works towards meeting these goals.    Expected Outcomes Patient will complete the program meeting both program and personal goals.             ITP Comments:   Comments: ITP REVIEW Pt is making expected progress toward pulmonary rehab goals after completing 21 sessions. Recommend continued exercise, life style modification, education, and utilization of breathing techniques to increase stamina and strength and decrease shortness of breath with exertion.

## 2021-10-07 ENCOUNTER — Encounter (HOSPITAL_COMMUNITY)
Admission: RE | Admit: 2021-10-07 | Discharge: 2021-10-07 | Disposition: A | Discharge status: Home / Self Care | Payer: No Typology Code available for payment source | Source: Ambulatory Visit | Attending: *Deleted | Admitting: *Deleted

## 2021-10-07 DIAGNOSIS — J449 Chronic obstructive pulmonary disease, unspecified: Secondary | ICD-10-CM | POA: Diagnosis not present

## 2021-10-07 NOTE — Progress Notes (Signed)
Daily Session Note  Patient Details  Name: Ricky ROSOL Sr. MRN: [id number] Date of Birth: March 07, 1951 Referring Provider:   Flowsheet Row PULMONARY REHAB COPD ORIENTATION from 06/30/2021 in [place]  Referring Provider Dr. Lula Olszewski       Encounter Date: 10/07/2021  Check In:  Session Check In - 10/07/21 1330       Check-In   Supervising physician immediately available to respond to emergencies [facility] MD immediately available    Physician(s) Dr. Harl Bowie    Location [facility]    Staff Present Geanie Cooley, RN;Dodger Sinning Kris Mouton, MS, ACSM-CEP, Exercise Physiologist;Heather Zigmund Daniel, Exercise Physiologist    Virtual Visit No    Medication changes reported     No    Fall or balance concerns reported    Yes    Comments using rollator for balance    Tobacco Cessation No Change    Warm-up and Cool-down Performed as group-led instruction    Resistance Training Performed Yes    VAD Patient? No    PAD/SET Patient? No      Pain Assessment   Currently in Pain? No/denies    Pain Score 0-No pain    Multiple Pain Sites No             Capillary Blood Glucose: No results found for this or any previous visit (from the past 24 hour(s)).    Social History   Tobacco Use  Smoking Status Former   Packs/day: 1.00   Years: 33.00   Pack years: 33.00   Types: Cigarettes   Quit date: 04/06/2000   Years since quitting: 21.5  Smokeless Tobacco Never  Tobacco Comments   use to chew cigars    Goals Met:  Independence with exercise equipment Exercise tolerated well No report of concerns or symptoms today Strength training completed today  Goals Unmet:  Not Applicable  Comments: checkout time is 1430   Dr. Kathie Dike is Medical Director for [facility].

## 2021-10-12 ENCOUNTER — Encounter (HOSPITAL_COMMUNITY)
Admission: RE | Admit: 2021-10-12 | Discharge: 2021-10-12 | Disposition: A | Discharge status: Home / Self Care | Payer: No Typology Code available for payment source | Source: Ambulatory Visit | Attending: *Deleted | Admitting: *Deleted

## 2021-10-12 DIAGNOSIS — J449 Chronic obstructive pulmonary disease, unspecified: Secondary | ICD-10-CM | POA: Diagnosis not present

## 2021-10-12 NOTE — Progress Notes (Signed)
Daily Session Note  Patient Details  Name: Ricky KOWALSKI Sr. MRN: [id number] Date of Birth: [birth year] Referring Provider:   Flowsheet Row PULMONARY REHAB COPD ORIENTATION from 06/30/2021 in [place]  Referring Provider Dr. Lula Olszewski       Encounter Date: 10/12/2021  Check In:  Session Check In - 10/12/21 1330       Check-In   Supervising physician immediately available to respond to emergencies [facility] MD immediately available    Physician(s) Dr. Domenic Polite    [facility]    Staff Present Geanie Cooley, RN;Caleesi Kohl Otho Ket, BS, Exercise Physiologist    Virtual Visit No    Medication changes reported     No    Fall or balance concerns reported    No    Tobacco Cessation No Change    Warm-up and Cool-down Performed as group-led instruction    Resistance Training Performed Yes    VAD Patient? No    PAD/SET Patient? No      Pain Assessment   Currently in Pain? No/denies    Pain Score 0-No pain    Multiple Pain Sites No             Capillary Blood Glucose: No results found for this or any previous visit (from the past 24 hour(s)).    Social History   Tobacco Use  Smoking Status Former   Packs/day: 1.00   Years: 33.00   Pack years: 33.00   Types: Cigarettes   Quit date: 04/06/2000   Years since quitting: 21.5  Smokeless Tobacco Never  Tobacco Comments   use to chew cigars    Goals Met:  Independence with exercise equipment Exercise tolerated well No report of concerns or symptoms today Strength training completed today  Goals Unmet:  Not Applicable  Comments: check out 1430   Dr. Kathie Dike is Medical Director for [facility] Pul[facility] Re[facility].

## 2021-10-14 ENCOUNTER — Encounter (HOSPITAL_COMMUNITY)
Admission: RE | Admit: 2021-10-14 | Discharge: 2021-10-14 | Disposition: A | Discharge status: Home / Self Care | Payer: No Typology Code available for payment source | Source: Ambulatory Visit | Attending: *Deleted | Admitting: *Deleted

## 2021-10-14 DIAGNOSIS — J449 Chronic obstructive pulmonary disease, unspecified: Secondary | ICD-10-CM | POA: Diagnosis not present

## 2021-10-14 NOTE — Progress Notes (Signed)
Daily Session Note  Patient Details  Name: Ricky ZUERCHER Sr. MRN: [id number] Date of Birth: 1951/09/09 Referring Provider:   Flowsheet Row PULMONARY REHAB COPD ORIENTATION from 06/30/2021 in [place]  Referring Provider Dr. Lula Olszewski       Encounter Date: 10/14/2021  Check In:  Session Check In - 10/14/21 1330       Check-In   Supervising physician immediately available to respond to emergencies [facility] immediately available    Physician(s) Dr.Schumann    Location [facility]    Staff Present Geanie Cooley, RN;Dayden Viverette Otho Ket, BS, Exercise Physiologist    Virtual Visit No    Medication changes reported     No    Fall or balance concerns reported    No    Tobacco Cessation No Change    Warm-up and Cool-down Performed as group-led instruction    Resistance Training Performed Yes    VAD Patient? No    PAD/SET Patient? No      Pain Assessment   Currently in Pain? No/denies    Pain Score 0-No pain    Multiple Pain Sites No             Capillary Blood Glucose: No results found for this or any previous visit (from the past 24 hour(s)).    Social History   Tobacco Use  Smoking Status Former   Packs/day: 1.00   Years: 33.00   Pack years: 33.00   Types: Cigarettes   Quit date: 04/06/2000   Years since quitting: 21.5  Smokeless Tobacco Never  Tobacco Comments   use to chew cigars    Goals Met:  Independence with exercise equipment Exercise tolerated well No report of concerns or symptoms today Strength training completed today  Goals Unmet:  Not Applicable  Comments: check out 1430   Dr. Kathie Dike is Medical Director for [facility] Pulmonary Re[facility].

## 2021-10-19 ENCOUNTER — Encounter (HOSPITAL_COMMUNITY)
Admission: RE | Admit: 2021-10-19 | Discharge: 2021-10-19 | Disposition: A | Discharge status: Home / Self Care | Payer: No Typology Code available for payment source | Source: Ambulatory Visit | Attending: *Deleted | Admitting: *Deleted

## 2021-10-19 VITALS — Wt 278.0 lb

## 2021-10-19 DIAGNOSIS — J449 Chronic obstructive pulmonary disease, unspecified: Secondary | ICD-10-CM

## 2021-10-19 NOTE — Progress Notes (Signed)
Daily Session Note  Patient Details  Name: Ricky ZEHNER Sr. MRN: [id number] Date of Birth: June 05, 1951 Referring Provider:   Flowsheet Row PULMONARY REHAB COPD ORIENTATION from 06/30/2021 in [place]  Referring Provider Dr. Lula Olszewski       Encounter Date: 10/19/2021  Check In:  Session Check In - 10/19/21 1330       Check-In   Supervising physician immediately available to respond to emergencies [facility] immediately available    Physician(s) Dr. Harl Bowie    Location [facility]    Staff Present Geanie Cooley, RN;Heather Otho Ket, BS, Exercise Physiologist;Dalton Kris Mouton, MS, ACSM-CEP, Exercise Physiologist    Virtual Visit No    Medication changes reported     No    Fall or balance concerns reported    No    Tobacco Cessation No Change    Warm-up and Cool-down Performed as group-led instruction    Resistance Training Performed Yes    VAD Patient? No    PAD/SET Patient? No      Pain Assessment   Currently in Pain? No/denies    Pain Score 0-No pain    Multiple Pain Sites No             Capillary Blood Glucose: No results found for this or any previous visit (from the past 24 hour(s)).    Social History   Tobacco Use  Smoking Status Former   Packs/day: 1.00   Years: 33.00   Pack years: 33.00   Types: Cigarettes   Quit date: 04/06/2000   Years since quitting: 21.5  Smokeless Tobacco Never  Tobacco Comments   use to chew cigars    Goals Met:  Proper associated with RPD/PD & O2 Sat Independence with exercise equipment Using PLB without cueing & demonstrates good technique Exercise tolerated well No report of concerns or symptoms today Strength training completed today  Goals Unmet:  Not Applicable  Comments: check out @ 2:30pm   Dr. Kathie Dike is Medical Director for [facility] Pul[facility] Re[facility].

## 2021-10-21 ENCOUNTER — Encounter (HOSPITAL_COMMUNITY): Payer: No Typology Code available for payment source

## 2021-10-26 ENCOUNTER — Encounter (HOSPITAL_COMMUNITY)
Admission: RE | Admit: 2021-10-26 | Discharge: 2021-10-26 | Disposition: A | Discharge status: Home / Self Care | Payer: No Typology Code available for payment source | Source: Ambulatory Visit | Attending: *Deleted | Admitting: *Deleted

## 2021-10-26 DIAGNOSIS — J449 Chronic obstructive pulmonary disease, unspecified: Secondary | ICD-10-CM | POA: Diagnosis not present

## 2021-10-28 ENCOUNTER — Encounter (HOSPITAL_COMMUNITY): Payer: No Typology Code available for payment source

## 2021-11-02 ENCOUNTER — Encounter (HOSPITAL_COMMUNITY)
Admission: RE | Admit: 2021-11-02 | Discharge: 2021-11-02 | Disposition: A | Discharge status: Home / Self Care | Payer: No Typology Code available for payment source | Source: Ambulatory Visit | Attending: *Deleted | Admitting: *Deleted

## 2021-11-02 DIAGNOSIS — J449 Chronic obstructive pulmonary disease, unspecified: Secondary | ICD-10-CM | POA: Diagnosis present

## 2021-11-02 NOTE — Progress Notes (Signed)
Daily Session Note  Patient Details  Name: Ricky Johnston Sr. MRN: [id number] Date of Birth: 01-25-1951 Referring Provider:   Flowsheet Row PULMONARY REHAB COPD ORIENTATION from 06/30/2021 in [place]  Referring Provider Dr. Lula Olszewski       Encounter Date: 11/02/2021  Check In:  Session Check In - 11/02/21 1330       Check-In   Supervising physician immediately available to respond to emergencies [facility] immediately available    Physician(s) Dr. Harl Bowie    Location [facility]    Staff Present Geanie Cooley, RN;Heather Otho Ket, BS, Exercise Physiologist    Virtual Visit No    Medication changes reported     No    Fall or balance concerns reported    No    Comments using rollator for balance    Tobacco Cessation No Change    Warm-up and Cool-down Performed as group-led instruction    Resistance Training Performed Yes    VAD Patient? No    PAD/SET Patient? No      Pain Assessment   Currently in Pain? No/denies    Pain Score 0-No pain    Multiple Pain Sites No             Capillary Blood Glucose: No results found for this or any previous visit (from the past 24 hour(s)).    Social History   Tobacco Use  Smoking Status Former   Packs/day: 1.00   Years: 33.00   Pack years: 33.00   Types: Cigarettes   Quit date: 04/06/2000   Years since quitting: 21.5  Smokeless Tobacco Never  Tobacco Comments   use to chew cigars    Goals Met:  Proper associated with RPD/PD & O2 Sat Independence with exercise equipment Improved SOB with ADL's Exercise tolerated well No report of concerns or symptoms today Strength training completed today  Goals Unmet:  Not Applicable  Comments: check out @ 2:30pm   Dr. Kathie Dike is Medical Director for [facility] Pul[facility] Re[facility].

## 2021-11-04 ENCOUNTER — Encounter (HOSPITAL_COMMUNITY)
Admission: RE | Admit: 2021-11-04 | Discharge: 2021-11-04 | Disposition: A | Discharge status: Home / Self Care | Payer: No Typology Code available for payment source | Source: Ambulatory Visit | Attending: *Deleted | Admitting: *Deleted

## 2021-11-04 VITALS — Ht 68.0 in | Wt 275.6 lb

## 2021-11-04 DIAGNOSIS — J449 Chronic obstructive pulmonary disease, unspecified: Secondary | ICD-10-CM

## 2021-11-04 NOTE — Progress Notes (Signed)
I have reviewed a Home Exercise Prescription with [name] Ricky Johnston is not currently exercising at home.  The patient was advised to walk 3 days a week for 30-45 minutes.  Ricky Johnston and I discussed how to progress their exercise prescription.  The patient stated that their goals were build back his strength.  The patient stated that they understand the exercise prescription.  We reviewed exercise guidelines, target heart rate during exercise, RPE Scale, weather conditions, NTG use, endpoints for exercise, warmup and cool down.  Patient is encouraged to come to me with any questions. I will continue to follow up with the patient to assist them with progression and safety.

## 2021-11-04 NOTE — Progress Notes (Signed)
Daily Session Note  Patient Details  Name: Ricky WERY Sr. MRN: [id number] Date of Birth: 1951/10/29 Referring Provider:   Flowsheet Row PULMONARY REHAB COPD ORIENTATION from 06/30/2021 in [place]  Referring Provider Dr. Lula Olszewski       Encounter Date: 11/04/2021  Check In:  Session Check In - 11/04/21 1330       Check-In   Supervising physician immediately available to respond to emergencies [facility] MD immediately available    Physician(s) Dr. Gasper Sells    Location [facility]    Staff Present Redge Gainer, BS, Exercise Physiologist;Male Iafrate Kris Mouton, MS, ACSM-CEP, Exercise Physiologist    Virtual Visit No    Medication changes reported     No    Fall or balance concerns reported    No    Tobacco Cessation No Change    Warm-up and Cool-down Performed as group-led instruction    Resistance Training Performed Yes    VAD Patient? No    PAD/SET Patient? No      Pain Assessment   Currently in Pain? No/denies    Pain Score 0-No pain    Multiple Pain Sites No             Capillary Blood Glucose: No results found for this or any previous visit (from the past 24 hour(s)).    Social History   Tobacco Use  Smoking Status Former   Packs/day: 1.00   Years: 33.00   Pack years: 33.00   Types: Cigarettes   Quit date: 04/06/2000   Years since quitting: 21.5  Smokeless Tobacco Never  Tobacco Comments   use to chew cigars    Goals Met:  Independence with exercise equipment Exercise tolerated well No report of concerns or symptoms today Strength training completed today  Goals Unmet:  Not Applicable  Comments: checkout time is 1430   Dr. Kathie Dike is Medical Director for [facility] Pulmonary Rehab.

## 2021-11-08 NOTE — Progress Notes (Signed)
Discharge Progress Report  Patient Details  Name: Ricky MUNFORD Sr. MRN: [id number] Date of Birth: [birth year] Referring Provider:   Flowsheet Row PULMONARY REHAB COPD ORIENTATION from 06/30/2021 in [facility]  Referring Provider Dr. Lula Olszewski        Number of Visits: 28  Reason for Discharge:  Patient reached a stable level of exercise. Patient independent in their exercise. Patient has met program and personal goals.  Smoking History:  Social History   Tobacco Use  Smoking Status Former   Packs/day: 1.00   Years: 33.00   Pack years: 33.00   Types: Cigarettes   Quit date: 04/06/2000   Years since quitting: 21.6  Smokeless Tobacco Never  Tobacco Comments   use to chew cigars    Diagnosis:  Chronic obstructive pulmonary disease, unspecified COPD type ([place])  ADL UCSD:  Pulmonary Assessment Scores     Row Name 06/30/21 1426         ADL UCSD   ADL Phase Entry     SOB Score total 30     Rest 2     Walk 3     Stairs 5     Bath 3     Dress 3     Shop 0       CAT Score   CAT Score 26       mMRC Score   mMRC Score 4              Initial Exercise Prescription:  Initial Exercise Prescription - 06/30/21 1400       Date of Initial Exercise RX and Referring Provider   Date 06/30/21    Referring Provider Dr. Lula Olszewski    Expected Discharge Date 10/28/21      Oxygen   Oxygen Continuous    Liters 4      NuStep   Level 1    SPM 60    Minutes 39      Prescription Details   Frequency (times per week) 2    Duration Progress to 30 minutes of continuous aerobic without signs/symptoms of physical distress      Intensity   THRR 40-80% of Max Heartrate 60-120    Ratings of Perceived Exertion 11-13    Perceived Dyspnea 0-4      Resistance Training   Training Prescription Yes    Weight 3 lbs    Reps 10-15             Discharge Exercise Prescription (Final Exercise Prescription Changes):  Exercise Prescription Changes  - 11/02/21 1500       Home Exercise Plan   Plans to continue exercise at Home (comment)    Frequency Add 3 additional days to program exercise sessions.    Initial Home Exercises Provided 11/02/21             Functional Capacity:  6 Minute Walk     Row Name 06/30/21 1435 11/04/21 1507       6 Minute Walk   Phase Initial Discharge    Distance 550 feet 800 feet    Distance Feet Change -- 250 ft    Walk Time 6 minutes 6 minutes    # of Rest Breaks 2 2    MPH 1 1.5    METS 0.96 1.74    RPE 13 13    Perceived Dyspnea  15 11    VO2 Peak 3.35 6.08    Symptoms Yes (comment) Yes (comment)  Comments used rollator. Two standing breaks due to SOB Pt used his rollator during the walk. He has to take two standing rest breaks due to SOB    Resting HR 71 bpm 77 bpm    Resting BP 126/70 120/60    Resting Oxygen Saturation  96 % 97 %    Exercise Oxygen Saturation  during 6 min walk 93 % 90 %    Max Ex. HR 106 bpm 128 bpm    Max Ex. BP 154/60 160/50    2 Minute Post BP 126/68 130/50      Interval HR   1 Minute HR 92 124    2 Minute HR 100 126    3 Minute HR 96 124    4 Minute HR 106 123    5 Minute HR 99 123    6 Minute HR 98 128    2 Minute Post HR 82 97    Interval Heart Rate? Yes Yes      Interval Oxygen   Interval Oxygen? Yes Yes    Baseline Oxygen Saturation % 96 % 97 %    1 Minute Oxygen Saturation % 96 % 92 %    1 Minute Liters of Oxygen 4 L 4 L    2 Minute Oxygen Saturation % 95 % 91 %    2 Minute Liters of Oxygen 4 L 4 L    3 Minute Oxygen Saturation % 93 % 90 %    3 Minute Liters of Oxygen 4 L 4 L    4 Minute Oxygen Saturation % 95 % 93 %    4 Minute Liters of Oxygen 4 L 4 L    5 Minute Oxygen Saturation % 94 % 92 %    5 Minute Liters of Oxygen 4 L 4 L    6 Minute Oxygen Saturation % 94 % 92 %    6 Minute Liters of Oxygen 4 L 4 L    2 Minute Post Oxygen Saturation % 97 % 97 %    2 Minute Post Liters of Oxygen 4 L 4 L             Psychological, QOL,  Others - Outcomes: PHQ 2/9: Depression screen [facility] 2/9 11/08/2021 06/30/2021 12/31/2018 09/28/2018 08/07/2018  Decreased Interest 0 0 0 0 1  Down, Depressed, Hopeless 0 0 0 0 2  PHQ - 2 Score 0 0 0 0 3  Altered sleeping 0 0 0 0 2  Tired, decreased energy 0 0 _0 Change in appetite 0 1 0 0 0  Feeling bad or failure about yourself  0 0 0 0 0  Trouble concentrating 0 0 0 0 0  Moving slowly or fidgety/restless 0 0 0 0 0  Suicidal thoughts 0 0 0 0 0  PHQ-9 Score 0 _1 Difficult doing work/chores Not difficult at all Not difficult at all Not difficult at all Not difficult at all Very difficult    Quality of Life:  Quality of Life - 06/30/21 1434       Quality of Life   Select Quality of Life      Quality of Life Scores   Health/Function Pre 12.66 %    Socioeconomic Pre 11.8 %    Psych/Spiritual Pre 11.79 %    Family Pre 12.4 %    GLOBAL Pre 12.3 %             Personal Goals: Goals established at  orientation with interventions provided to work toward goal.  Personal Goals and Risk Factors at Admission - 06/30/21 1416       Core Components/Risk Factors/Patient Goals on Admission    Weight Management Obesity    Improve shortness of breath with ADL's Yes    Intervention Provide education, individualized exercise plan and daily activity instruction to help decrease symptoms of SOB with activities of daily living.    Expected Outcomes Short Term: Improve cardiorespiratory fitness to achieve a reduction of symptoms when performing ADLs;Long Term: Be able to perform more ADLs without symptoms or delay the onset of symptoms    Diabetes Yes    Intervention Provide education about signs/symptoms and action to take for hypo/hyperglycemia.;Provide education about proper nutrition, including hydration, and aerobic/resistive exercise prescription along with prescribed medications to achieve blood glucose in normal ranges: Fasting glucose 65-99 mg/dL    Expected Outcomes Short Term:  Participant verbalizes understanding of the signs/symptoms and immediate care of hyper/hypoglycemia, proper foot care and importance of medication, aerobic/resistive exercise and nutrition plan for blood glucose control.    Personal Goal Other Yes    Personal Goal Improve SOB. Get stronger and strenghten lungs to be able to do more activities.    Intervention Patient will attend PR 2 days/week and supplement with exercise 3 days/week.    Expected Outcomes Patient will complete the program and meet both personal and program.              Personal Goals Discharge:  Goals and Risk Factor Review     Row Name 07/07/21 1152 08/05/21 1501 08/30/21 1216 09/27/21 1420 11/08/21 1329     Core Components/Risk Factors/Patient Goals Review   Personal Goals Review Weight Management/Obesity;Improve shortness of breath with ADL's;Diabetes;Hypertension;Stress Weight Management/Obesity;Improve shortness of breath with ADL's;Diabetes;Hypertension;Stress Weight Management/Obesity;Improve shortness of breath with ADL's;Diabetes;Hypertension;Stress Weight Management/Obesity;Improve shortness of breath with ADL's;Diabetes Weight Management/Obesity;Improve shortness of breath with ADL's;Diabetes   Review Patient was referred to PR with COPD. He has completed 1 session. His goals for the program are to improve his SOB; get stronger and be able to do more activities. We will continue to monitor his progress as he works towards meeting these goals. Patient has completed 8 sessions gaining 1 KG since his initial visit. He is doing well in the program with consistent attendance. He exercises on 4L O2 saturating at 94-96%. His blood pressure is above goal at times. No A1C on file and he does not always report his glucose readings. 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 His personal goals for the program are to improve his SOB; get stronger to be able to do more activities. We will continue to monitor his  progress as he works towards meeting these goals. Patient has completed12 sessions. Weight remain consistance around 126kg.  He is doing well in the program with consistent attendance. He exercises on 4L O2 saturating at 92-96%. His blood pressure is above goal at times. No A1C on file and he does not always report his glucose readings. 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 His personal goals for the program are to improve his SOB; get stronger to be able to do more activities. We will continue to monitor his progress as he works towards meeting these goals. Patient has completed18 sessions. Weight remain consistance around 124kg.  He is doing well in the program with consistent attendance. He exercises on 4L O2 saturating at 93-95%.  He  has chronic pain in R/hip but does well on the NuStep. His personal goals for the program are to improve his SOB; get stronger to be able to do more activities. We will continue to monitor his progress as he works towards meeting these goals. Pt graduated after 28 sessions. He lost 1.7 kg while he was in the program. He had consistent attendance and vitals were WNL while in the program. He reports that he is now able to be more active around the house and to do more activities before being limited by shortness of breath. He was able to increase his six minute walk test distance by 45.5%.   Expected Outcomes Patient will complete the program meeting both program and personal goals. Patient will complete the program meeting both program and personal goals. Patient will complete the program meeting both program and personal goals. Patient will complete the program meeting both program and personal goals. Pt will continue to work towards their goals post discharge.            Exercise Goals and Review:  Exercise Goals     Row Name 06/30/21 1438 07/13/21 1440 08/10/21 1645 09/07/21 1534       Exercise Goals   Increase Physical Activity Yes Yes Yes Yes     Intervention Provide advice, education, support and counseling about physical activity/exercise needs.;Develop an individualized exercise prescription for aerobic and resistive training based on initial evaluation findings, risk stratification, comorbidities and participant's personal goals. Provide advice, education, support and counseling about physical activity/exercise needs.;Develop an individualized exercise prescription for aerobic and resistive training based on initial evaluation findings, risk stratification, comorbidities and participant's personal goals. Provide advice, education, support and counseling about physical activity/exercise needs.;Develop an individualized exercise prescription for aerobic and resistive training based on initial evaluation findings, risk stratification, comorbidities and participant's personal goals. Provide advice, education, support and counseling about physical activity/exercise needs.;Develop an individualized exercise prescription for aerobic and resistive training based on initial evaluation findings, risk stratification, comorbidities and participant's personal goals.    Expected Outcomes Short Term: Attend rehab on a regular basis to increase amount of physical activity.;Long Term: Add in home exercise to make exercise part of routine and to increase amount of physical activity.;Long Term: Exercising regularly at least 3-5 days a week. Short Term: Attend rehab on a regular basis to increase amount of physical activity.;Long Term: Add in home exercise to make exercise part of routine and to increase amount of physical activity.;Long Term: Exercising regularly at least 3-5 days a week. Short Term: Attend rehab on a regular basis to increase amount of physical activity.;Long Term: Add in home exercise to make exercise part of routine and to increase amount of physical activity.;Long Term: Exercising regularly at least 3-5 days a week. Short Term: Attend rehab on a  regular basis to increase amount of physical activity.;Long Term: Add in home exercise to make exercise part of routine and to increase amount of physical activity.;Long Term: Exercising regularly at least 3-5 days a week.    Increase Strength and Stamina Yes Yes Yes Yes    Intervention Provide advice, education, support and counseling about physical activity/exercise needs.;Develop an individualized exercise prescription for aerobic and resistive training based on initial evaluation findings, risk stratification, comorbidities and participant's personal goals. Provide advice, education, support and counseling about physical activity/exercise needs.;Develop an individualized exercise prescription for aerobic and resistive training based on initial evaluation findings, risk stratification, comorbidities and participant's personal goals. Provide advice, education, support and counseling  about physical activity/exercise needs.;Develop an individualized exercise prescription for aerobic and resistive training based on initial evaluation findings, risk stratification, comorbidities and participant's personal goals. Provide advice, education, support and counseling about physical activity/exercise needs.;Develop an individualized exercise prescription for aerobic and resistive training based on initial evaluation findings, risk stratification, comorbidities and participant's personal goals.    Expected Outcomes Short Term: Increase workloads from initial exercise prescription for resistance, speed, and METs.;Short Term: Perform resistance training exercises routinely during rehab and add in resistance training at home;Long Term: Improve cardiorespiratory fitness, muscular endurance and strength as measured by increased METs and functional capacity (6MWT) Short Term: Increase workloads from initial exercise prescription for resistance, speed, and METs.;Short Term: Perform resistance training exercises routinely during  rehab and add in resistance training at home;Long Term: Improve cardiorespiratory fitness, muscular endurance and strength as measured by increased METs and functional capacity (6MWT) Short Term: Increase workloads from initial exercise prescription for resistance, speed, and METs.;Short Term: Perform resistance training exercises routinely during rehab and add in resistance training at home;Long Term: Improve cardiorespiratory fitness, muscular endurance and strength as measured by increased METs and functional capacity (6MWT) Short Term: Increase workloads from initial exercise prescription for resistance, speed, and METs.;Short Term: Perform resistance training exercises routinely during rehab and add in resistance training at home;Long Term: Improve cardiorespiratory fitness, muscular endurance and strength as measured by increased METs and functional capacity (6MWT)    Able to understand and use rate of perceived exertion (RPE) scale Yes Yes Yes Yes    Intervention Provide education and explanation on how to use RPE scale Provide education and explanation on how to use RPE scale Provide education and explanation on how to use RPE scale Provide education and explanation on how to use RPE scale    Expected Outcomes Short Term: Able to use RPE daily in rehab to express subjective intensity level;Long Term:  Able to use RPE to guide intensity level when exercising independently Short Term: Able to use RPE daily in rehab to express subjective intensity level;Long Term:  Able to use RPE to guide intensity level when exercising independently Short Term: Able to use RPE daily in rehab to express subjective intensity level;Long Term:  Able to use RPE to guide intensity level when exercising independently Short Term: Able to use RPE daily in rehab to express subjective intensity level;Long Term:  Able to use RPE to guide intensity level when exercising independently    Able to understand and use Dyspnea scale Yes Yes  Yes Yes    Intervention Provide education and explanation on how to use Dyspnea scale Provide education and explanation on how to use Dyspnea scale Provide education and explanation on how to use Dyspnea scale Provide education and explanation on how to use Dyspnea scale    Expected Outcomes Short Term: Able to use Dyspnea scale daily in rehab to express subjective sense of shortness of breath during exertion;Long Term: Able to use Dyspnea scale to guide intensity level when exercising independently Short Term: Able to use Dyspnea scale daily in rehab to express subjective sense of shortness of breath during exertion;Long Term: Able to use Dyspnea scale to guide intensity level when exercising independently Short Term: Able to use Dyspnea scale daily in rehab to express subjective sense of shortness of breath during exertion;Long Term: Able to use Dyspnea scale to guide intensity level when exercising independently Short Term: Able to use Dyspnea scale daily in rehab to express subjective sense of shortness of breath during exertion;Long  Term: Able to use Dyspnea scale to guide intensity level when exercising independently    Knowledge and understanding of Target Heart Rate Range (THRR) Yes Yes Yes Yes    Intervention Provide education and explanation of THRR including how the numbers were predicted and where they are located for reference Provide education and explanation of THRR including how the numbers were predicted and where they are located for reference Provide education and explanation of THRR including how the numbers were predicted and where they are located for reference Provide education and explanation of THRR including how the numbers were predicted and where they are located for reference    Expected Outcomes Short Term: Able to state/look up THRR;Short Term: Able to use daily as guideline for intensity in rehab;Long Term: Able to use THRR to govern intensity when exercising independently Short  Term: Able to state/look up THRR;Short Term: Able to use daily as guideline for intensity in rehab;Long Term: Able to use THRR to govern intensity when exercising independently Short Term: Able to state/look up THRR;Short Term: Able to use daily as guideline for intensity in rehab;Long Term: Able to use THRR to govern intensity when exercising independently Short Term: Able to state/look up THRR;Short Term: Able to use daily as guideline for intensity in rehab;Long Term: Able to use THRR to govern intensity when exercising independently    Understanding of Exercise Prescription Yes Yes Yes Yes    Intervention Provide education, explanation, and written materials on patient's individual exercise prescription Provide education, explanation, and written materials on patient's individual exercise prescription Provide education, explanation, and written materials on patient's individual exercise prescription Provide education, explanation, and written materials on patient's individual exercise prescription    Expected Outcomes Short Term: Able to explain program exercise prescription;Long Term: Able to explain home exercise prescription to exercise independently Short Term: Able to explain program exercise prescription;Long Term: Able to explain home exercise prescription to exercise independently Short Term: Able to explain program exercise prescription;Long Term: Able to explain home exercise prescription to exercise independently Short Term: Able to explain program exercise prescription;Long Term: Able to explain home exercise prescription to exercise independently             Exercise Goals Re-Evaluation:  Exercise Goals Re-Evaluation     Row Name 07/13/21 1440 08/10/21 1645 09/07/21 1534 10/05/21 1440       Exercise Goal Re-Evaluation   Exercise Goals Review Increase Physical Activity;Increase Strength and Stamina;Able to understand and use rate of perceived exertion (RPE) scale;Knowledge and  understanding of Target Heart Rate Range (THRR);Able to check pulse independently;Understanding of Exercise Prescription Increase Physical Activity;Increase Strength and Stamina;Able to understand and use rate of perceived exertion (RPE) scale;Knowledge and understanding of Target Heart Rate Range (THRR);Able to check pulse independently;Understanding of Exercise Prescription Increase Physical Activity;Increase Strength and Stamina;Able to understand and use rate of perceived exertion (RPE) scale;Knowledge and understanding of Target Heart Rate Range (THRR);Able to check pulse independently;Understanding of Exercise Prescription Increase Physical Activity;Increase Strength and Stamina;Able to understand and use rate of perceived exertion (RPE) scale;Knowledge and understanding of Target Heart Rate Range (THRR);Able to check pulse independently;Understanding of Exercise Prescription    Comments Pt has completed 3 exercise sessions. He is deconditioned and limited by right hip pain, but he does give good effort while he is in rehab. He is currently exercising at 2.0 METs on the stepper. Will continue to monitor and progress as able. Pt has completed 8 exercise sessions. He has made some progress but has  had to miss several sessions due to other appointments and health problems. He is currently exercising at 2.0 METs on the stepper. Will continue to monitor and progress as able. Pt has completed 15 sessions of cardiac rehab. He is progressing and increasing his workload. He is currently exercising at 2.1 METs on the stepper. Will continue to monitor and progress as able. Pt has completed 20 sessions of pulmonary rehab. He is progressing and continues to work during class. He is currently exercising at 2.2 METs on  the stepper. Will continue to montior and progress a s able.    Expected Outcomes Through exercise at rehab and at home, the patient will meet their stated goals. Through exercise at rehab and at home, the  patient will meet their stated goals. Through exercise at rehab and at home, the patient will meet their stated goals. Through exercise at rehab and at home, the patient will meet their stated goals.             Nutrition & Weight - Outcomes:  Pre Biometrics - 06/30/21 1439       Pre Biometrics   Height 5' 8" (1.727 m)    Weight 279 lb 5.2 oz (126.7 kg)    Waist Circumference 55 inches    Hip Circumference 51 inches    Waist to Hip Ratio 1.08 %    BMI (Calculated) 42.48    Triceps Skinfold 7 mm    % Body Fat 37.3 %    Grip Strength 28 kg    Flexibility 0 in    Single Leg Stand 0 seconds             Post Biometrics - 11/04/21 1511        Post  Biometrics   Height 5' 8" (1.727 m)    Weight 275 lb 9.2 oz (125 kg)    Waist Circumference 56 inches    Hip Circumference 55 inches    Waist to Hip Ratio 1.02 %    BMI (Calculated) 41.91    Triceps Skinfold 8 mm    % Body Fat 38.1 %             Nutrition:  Nutrition Therapy & Goals - 09/27/21 1417       Personal Nutrition Goals   Comments Will continue to provide healthy nutritional education through handout.  Weight remains about the same around 124kg.      Intervention Plan   Intervention Nutrition handout(s) given to patient.             Nutrition Discharge:  Nutrition Assessments - 11/08/21 1320       MEDFICTS Scores   Pre Score 127    Post Score 60    Score Difference -67             Education Questionnaire Score:  Knowledge Questionnaire Score - 06/30/21 1448       Knowledge Questionnaire Score   Pre Score 15/18             Goals reviewed with patient; copy given to patient. Pt graduated from pulmonary rehab after 28 sessions. He had consistent attendance and was able to increase his six minute walk test distance by 45.5%. His MET level at graduation was 2.4.

## 2021-11-16 ENCOUNTER — Ambulatory Visit: Payer: No Typology Code available for payment source | Admitting: Podiatry

## 2021-12-20 ENCOUNTER — Ambulatory Visit: Payer: No Typology Code available for payment source | Admitting: Orthopaedic Surgery

## 2022-01-17 ENCOUNTER — Ambulatory Visit: Payer: No Typology Code available for payment source | Admitting: Orthopaedic Surgery

## 2022-01-31 ENCOUNTER — Ambulatory Visit: Payer: No Typology Code available for payment source | Admitting: Orthopaedic Surgery

## 2023-05-06 ENCOUNTER — Emergency Department (HOSPITAL_COMMUNITY): Payer: Medicare PPO

## 2023-05-06 ENCOUNTER — Other Ambulatory Visit: Payer: Self-pay

## 2023-05-06 ENCOUNTER — Observation Stay (HOSPITAL_COMMUNITY)
Admission: EM | Admit: 2023-05-06 | Discharge: 2023-05-10 | Disposition: A | Discharge status: Skilled Nursing Facility | Payer: Medicare PPO | Attending: Internal Medicine | Admitting: Internal Medicine

## 2023-05-06 ENCOUNTER — Encounter (HOSPITAL_COMMUNITY): Payer: Self-pay

## 2023-05-06 DIAGNOSIS — R6520 Severe sepsis without septic shock: Secondary | ICD-10-CM

## 2023-05-06 DIAGNOSIS — Z86718 Personal history of other venous thrombosis and embolism: Secondary | ICD-10-CM | POA: Insufficient documentation

## 2023-05-06 DIAGNOSIS — E1169 Type 2 diabetes mellitus with other specified complication: Secondary | ICD-10-CM | POA: Insufficient documentation

## 2023-05-06 DIAGNOSIS — N1831 Chronic kidney disease, stage 3a: Secondary | ICD-10-CM

## 2023-05-06 DIAGNOSIS — Z87891 Personal history of nicotine dependence: Secondary | ICD-10-CM | POA: Diagnosis not present

## 2023-05-06 DIAGNOSIS — J189 Pneumonia, unspecified organism: Secondary | ICD-10-CM | POA: Insufficient documentation

## 2023-05-06 DIAGNOSIS — J438 Other emphysema: Secondary | ICD-10-CM | POA: Diagnosis not present

## 2023-05-06 DIAGNOSIS — Z96642 Presence of left artificial hip joint: Secondary | ICD-10-CM | POA: Diagnosis not present

## 2023-05-06 DIAGNOSIS — I130 Hypertensive heart and chronic kidney disease with heart failure and stage 1 through stage 4 chronic kidney disease, or unspecified chronic kidney disease: Secondary | ICD-10-CM | POA: Insufficient documentation

## 2023-05-06 DIAGNOSIS — Z79899 Other long term (current) drug therapy: Secondary | ICD-10-CM | POA: Insufficient documentation

## 2023-05-06 DIAGNOSIS — N400 Enlarged prostate without lower urinary tract symptoms: Secondary | ICD-10-CM | POA: Insufficient documentation

## 2023-05-06 DIAGNOSIS — I5022 Chronic systolic (congestive) heart failure: Secondary | ICD-10-CM | POA: Insufficient documentation

## 2023-05-06 DIAGNOSIS — Z7984 Long term (current) use of oral hypoglycemic drugs: Secondary | ICD-10-CM | POA: Diagnosis not present

## 2023-05-06 DIAGNOSIS — E1165 Type 2 diabetes mellitus with hyperglycemia: Secondary | ICD-10-CM | POA: Insufficient documentation

## 2023-05-06 DIAGNOSIS — Z1152 Encounter for screening for COVID-19: Secondary | ICD-10-CM | POA: Diagnosis not present

## 2023-05-06 DIAGNOSIS — E785 Hyperlipidemia, unspecified: Secondary | ICD-10-CM | POA: Diagnosis not present

## 2023-05-06 DIAGNOSIS — Z7982 Long term (current) use of aspirin: Secondary | ICD-10-CM | POA: Insufficient documentation

## 2023-05-06 DIAGNOSIS — J449 Chronic obstructive pulmonary disease, unspecified: Secondary | ICD-10-CM | POA: Diagnosis present

## 2023-05-06 DIAGNOSIS — I2510 Atherosclerotic heart disease of native coronary artery without angina pectoris: Secondary | ICD-10-CM | POA: Diagnosis not present

## 2023-05-06 DIAGNOSIS — Z8551 Personal history of malignant neoplasm of bladder: Secondary | ICD-10-CM | POA: Insufficient documentation

## 2023-05-06 DIAGNOSIS — R55 Syncope and collapse: Secondary | ICD-10-CM | POA: Diagnosis present

## 2023-05-06 DIAGNOSIS — Z7985 Long-term (current) use of injectable non-insulin antidiabetic drugs: Secondary | ICD-10-CM | POA: Insufficient documentation

## 2023-05-06 DIAGNOSIS — E1122 Type 2 diabetes mellitus with diabetic chronic kidney disease: Secondary | ICD-10-CM | POA: Diagnosis not present

## 2023-05-06 DIAGNOSIS — Z794 Long term (current) use of insulin: Secondary | ICD-10-CM | POA: Diagnosis not present

## 2023-05-06 DIAGNOSIS — E669 Obesity, unspecified: Secondary | ICD-10-CM | POA: Diagnosis present

## 2023-05-06 DIAGNOSIS — J9601 Acute respiratory failure with hypoxia: Secondary | ICD-10-CM | POA: Diagnosis not present

## 2023-05-06 DIAGNOSIS — A419 Sepsis, unspecified organism: Secondary | ICD-10-CM | POA: Diagnosis not present

## 2023-05-06 DIAGNOSIS — J9621 Acute and chronic respiratory failure with hypoxia: Secondary | ICD-10-CM | POA: Diagnosis not present

## 2023-05-06 DIAGNOSIS — I10 Essential (primary) hypertension: Secondary | ICD-10-CM | POA: Diagnosis present

## 2023-05-06 LAB — CBC WITH DIFFERENTIAL/PLATELET
Abs Immature Granulocytes: 0.02 10*3/uL (ref 0.00–0.07)
Basophils Absolute: 0 10*3/uL (ref 0.0–0.1)
Basophils Relative: 1 %
Eosinophils Absolute: 0.1 10*3/uL (ref 0.0–0.5)
Eosinophils Relative: 3 %
HCT: 41.9 % (ref 39.0–52.0)
Hemoglobin: 13 g/dL (ref 13.0–17.0)
Immature Granulocytes: 0 %
Lymphocytes Relative: 20 %
Lymphs Abs: 0.9 10*3/uL (ref 0.7–4.0)
MCH: 29.7 pg (ref 26.0–34.0)
MCHC: 31 g/dL (ref 30.0–36.0)
MCV: 95.9 fL (ref 80.0–100.0)
Monocytes Absolute: 0.4 10*3/uL (ref 0.1–1.0)
Monocytes Relative: 9 %
Neutro Abs: 3.1 10*3/uL (ref 1.7–7.7)
Neutrophils Relative %: 67 %
Platelets: 154 10*3/uL (ref 150–400)
RBC: 4.37 MIL/uL (ref 4.22–5.81)
RDW: 13.6 % (ref 11.5–15.5)
WBC: 4.5 10*3/uL (ref 4.0–10.5)
nRBC: 0 % (ref 0.0–0.2)

## 2023-05-06 LAB — URINALYSIS, W/ REFLEX TO CULTURE (INFECTION SUSPECTED)
Bacteria, UA: NONE SEEN
Bilirubin Urine: NEGATIVE
Glucose, UA: >=500 mg/dL — AB
Hgb urine dipstick: NEGATIVE
Ketones, ur: NEGATIVE mg/dL
Leukocytes,Ua: NEGATIVE
Nitrite: NEGATIVE
Protein, ur: NEGATIVE mg/dL
Specific Gravity, Urine: 1.02 (ref 1.005–1.030)
pH: 6 (ref 5.0–8.0)

## 2023-05-06 LAB — COMPREHENSIVE METABOLIC PANEL
ALT: 13 U/L (ref 0–44)
AST: 15 U/L (ref 15–41)
Albumin: 3.1 g/dL — ABNORMAL LOW (ref 3.5–5.0)
Alkaline Phosphatase: 76 U/L (ref 38–126)
Anion gap: 7 (ref 5–15)
BUN: 26 mg/dL — ABNORMAL HIGH (ref 8–23)
CO2: 31 mmol/L (ref 22–32)
Calcium: 8 mg/dL — ABNORMAL LOW (ref 8.9–10.3)
Chloride: 97 mmol/L — ABNORMAL LOW (ref 98–111)
Creatinine, Ser: 1.6 mg/dL — ABNORMAL HIGH (ref 0.61–1.24)
GFR, Estimated: 45 mL/min — ABNORMAL LOW (ref >60)
Glucose, Bld: 346 mg/dL — ABNORMAL HIGH (ref 70–99)
Potassium: 4 mmol/L (ref 3.5–5.1)
Sodium: 135 mmol/L (ref 135–145)
Total Bilirubin: 0.7 mg/dL (ref 0.3–1.2)
Total Protein: 6.6 g/dL (ref 6.5–8.1)

## 2023-05-06 LAB — TROPONIN I (HIGH SENSITIVITY)
Troponin I (High Sensitivity): 10 ng/L (ref <18)
Troponin I (High Sensitivity): 10 ng/L (ref <18)

## 2023-05-06 LAB — PROTIME-INR
INR: 1.1 (ref 0.8–1.2)
Prothrombin Time: 14.2 seconds (ref 11.4–15.2)

## 2023-05-06 LAB — RESP PANEL BY RT-PCR (RSV, FLU A&B, COVID)  RVPGX2
Influenza A by PCR: NEGATIVE
Influenza B by PCR: NEGATIVE
Resp Syncytial Virus by PCR: NEGATIVE
SARS Coronavirus 2 by RT PCR: NEGATIVE

## 2023-05-06 LAB — CBG MONITORING, ED: Glucose-Capillary: 258 mg/dL — ABNORMAL HIGH (ref 70–99)

## 2023-05-06 LAB — LACTIC ACID, PLASMA
Lactic Acid, Venous: 2.4 mmol/L (ref 0.5–1.9)
Lactic Acid, Venous: 1.6 mmol/L (ref 0.5–1.9)

## 2023-05-06 LAB — APTT: aPTT: 28 seconds (ref 24–36)

## 2023-05-06 LAB — GLUCOSE, CAPILLARY: Glucose-Capillary: 253 mg/dL — ABNORMAL HIGH (ref 70–99)

## 2023-05-06 MED ORDER — ALBUTEROL SULFATE (2.5 MG/3ML) 0.083% IN NEBU
2.5000 mg | INHALATION_SOLUTION | RESPIRATORY_TRACT | Status: DC | PRN
  Administered 2023-05-09: 2.5 mg via RESPIRATORY_TRACT
  Filled 2023-05-06: qty 3

## 2023-05-06 MED ORDER — SODIUM CHLORIDE 0.9 % IV SOLN
500.0000 mg | Freq: Once | INTRAVENOUS | Status: AC
  Administered 2023-05-06: 500 mg via INTRAVENOUS
  Filled 2023-05-06: qty 5

## 2023-05-06 MED ORDER — INSULIN ASPART 100 UNIT/ML IJ SOLN: 0.0000 [IU] | Freq: Every day | INTRAMUSCULAR | Status: DC

## 2023-05-06 MED ORDER — INSULIN REGULAR HUMAN (CONC) 500 UNIT/ML ~~LOC~~ SOPN
60.0000 [IU] | PEN_INJECTOR | Freq: Every day | SUBCUTANEOUS | Status: DC
  Administered 2023-05-07 – 2023-05-10 (×4): 60 [IU] via SUBCUTANEOUS
  Filled 2023-05-06: qty 3

## 2023-05-06 MED ORDER — TIOTROPIUM BROMIDE MONOHYDRATE 18 MCG IN CAPS: 18.0000 ug | ORAL_CAPSULE | Freq: Every day | RESPIRATORY_TRACT | Status: DC

## 2023-05-06 MED ORDER — ACETAMINOPHEN 325 MG PO TABS
650.0000 mg | ORAL_TABLET | Freq: Once | ORAL | Status: DC
  Filled 2023-05-06: qty 2

## 2023-05-06 MED ORDER — ROPINIROLE HCL 0.25 MG PO TABS
0.5000 mg | ORAL_TABLET | Freq: Every day | ORAL | Status: DC
  Administered 2023-05-06 – 2023-05-09 (×4): 0.5 mg via ORAL
  Filled 2023-05-06 (×4): qty 2

## 2023-05-06 MED ORDER — SODIUM CHLORIDE 0.9 % IV BOLUS
1000.0000 mL | Freq: Once | INTRAVENOUS | Status: AC
  Administered 2023-05-06: 1000 mL via INTRAVENOUS

## 2023-05-06 MED ORDER — TAMSULOSIN HCL 0.4 MG PO CAPS
0.4000 mg | ORAL_CAPSULE | Freq: Every day | ORAL | Status: DC
  Administered 2023-05-07 – 2023-05-10 (×4): 0.4 mg via ORAL
  Filled 2023-05-06 (×4): qty 1

## 2023-05-06 MED ORDER — HYDRALAZINE HCL 20 MG/ML IJ SOLN: 5.0000 mg | INTRAMUSCULAR | Status: DC | PRN

## 2023-05-06 MED ORDER — CEFTRIAXONE SODIUM 2 G IJ SOLR
2.0000 g | INTRAMUSCULAR | Status: DC
Start: 2023-05-07 — End: 2023-05-06

## 2023-05-06 MED ORDER — ACETAMINOPHEN 650 MG RE SUPP: 650.0000 mg | Freq: Four times a day (QID) | RECTAL | Status: DC | PRN

## 2023-05-06 MED ORDER — UMECLIDINIUM BROMIDE 62.5 MCG/ACT IN AEPB
1.0000 | INHALATION_SPRAY | Freq: Every day | RESPIRATORY_TRACT | Status: DC
  Administered 2023-05-07 – 2023-05-10 (×4): 1 via RESPIRATORY_TRACT
  Filled 2023-05-06: qty 7

## 2023-05-06 MED ORDER — OXYCODONE HCL 5 MG PO TABS: 5.0000 mg | ORAL_TABLET | ORAL | Status: DC | PRN

## 2023-05-06 MED ORDER — SODIUM CHLORIDE 0.9 % IV SOLN: 500.0000 mg | INTRAVENOUS | Status: DC

## 2023-05-06 MED ORDER — INSULIN REGULAR HUMAN (CONC) 500 UNIT/ML ~~LOC~~ SOPN
30.0000 [IU] | PEN_INJECTOR | Freq: Every day | SUBCUTANEOUS | Status: DC
  Administered 2023-05-06 – 2023-05-09 (×4): 30 [IU] via SUBCUTANEOUS
  Filled 2023-05-06: qty 3

## 2023-05-06 MED ORDER — SODIUM CHLORIDE 0.9 % IV SOLN: INTRAVENOUS | Status: DC

## 2023-05-06 MED ORDER — ACETAMINOPHEN 325 MG PO TABS
650.0000 mg | ORAL_TABLET | Freq: Four times a day (QID) | ORAL | Status: DC | PRN
  Administered 2023-05-07 – 2023-05-08 (×2): 650 mg via ORAL
  Filled 2023-05-06 (×2): qty 2

## 2023-05-06 MED ORDER — ROPINIROLE HCL 1 MG PO TABS
1.0000 mg | ORAL_TABLET | Freq: Every day | ORAL | Status: DC
  Administered 2023-05-06 – 2023-05-09 (×4): 1 mg via ORAL
  Filled 2023-05-06 (×4): qty 1

## 2023-05-06 MED ORDER — SODIUM CHLORIDE 0.9 % IV SOLN
2.0000 g | INTRAVENOUS | Status: DC
  Administered 2023-05-06: 2 g via INTRAVENOUS
  Filled 2023-05-06: qty 20

## 2023-05-06 MED ORDER — INSULIN ASPART 100 UNIT/ML IJ SOLN: 0.0000 [IU] | Freq: Three times a day (TID) | INTRAMUSCULAR | Status: DC

## 2023-05-06 MED ORDER — HEPARIN SODIUM (PORCINE) 5000 UNIT/ML IJ SOLN
5000.0000 [IU] | Freq: Three times a day (TID) | INTRAMUSCULAR | Status: DC
  Administered 2023-05-07 – 2023-05-10 (×10): 5000 [IU] via SUBCUTANEOUS
  Filled 2023-05-06 (×10): qty 1

## 2023-05-06 NOTE — Sepsis Progress Note (Signed)
Elink following for Sepsis Protocol 

## 2023-05-06 NOTE — ED Notes (Signed)
ED TO INPATIENT HANDOFF REPORT  ED Nurse Name and Phone #: Toni Amend [phone]  S Name/Age/Gender Ricky Course Sr. 72 y.o. male Room/Bed: APA07/APA07  Code Status   Code Status: Full Code  Home/SNF/Other Home Patient oriented to: self, place, time, and situation Is this baseline? Yes   Triage Complete: Triage complete  Chief Complaint Sepsis [facility]) [A41.9]  Triage Note Pt wife reports pt had been outside for an hour and then he slid out of a chair and was unresponsive.   Allergies Allergies  Allergen Reactions   Poison Ivy Extract [Poison Ivy Extract] Rash   Poison Oak Extract Rash    Level of Care/Admitting Diagnosis ED Disposition     ED Disposition  Admit   Condition  --   Comment  Hospital Area: [facility] [[id number]]  Level of Care: Telemetry [5]  Covid Evaluation: Asymptomatic - no recent exposure (last 10 days) testing not required  Diagnosis: Sepsis [facility]) [[id number]]  Admitting Physician: Chiquita Loth  Attending Physician: Randol Kern, DAWOOD S [[id number]]          B Medical/Surgery History Past Medical History:  Diagnosis Date   Arthritis    Cancer (HCC)    bladder cancer currently 2016   Complication of anesthesia    hard for him to wake up from Anesthesia from left nephrectomy   COPD (chronic obstructive pulmonary disease) (HCC)    Diabetes mellitus without complication (HCC)    DVT (deep venous thrombosis) (HCC) 01/08/2014, 2010   upper extremity   GERD (gastroesophageal reflux disease)    Hallux limitus 05/18/2015   from notes from [facility]    Headache    migraines   History of kidney stones    Hypercholesterolemia    Hypertension    Impaired hearing    Intervertebral disc syndrome    Kidney stones    Pulmonary fibrosis (HCC)    Renal calculi 01/08/2014   frrom noted from [facility] .in chart   Restless legs 01/08/2014   Sciatic leg pain    paralysis of sciatic nerve   Sleep apnea    CPAP/BIPAP   Tinnitus     Past Surgical History:  Procedure Laterality Date   ABDOMINAL SURGERY     BACK SURGERY     BLADDER REPAIR     EYE SURGERY     HERNIA REPAIR     LEFT HEART CATHETERIZATION WITH CORONARY ANGIOGRAM N/A 01/09/2015   Procedure: LEFT HEART CATHETERIZATION WITH CORONARY ANGIOGRAM;  Surgeon: Robynn Pane, MD;  Location: [facility] CATH LAB;  Service: Cardiovascular;  Laterality: N/A;   NEPHROURETERECTOMY  03/18/2016   with bladder cuff excision- from noted in chart from [facility]   TOTAL HIP ARTHROPLASTY Left 02/10/2017   Procedure: LEFT TOTAL HIP ARTHROPLASTY ANTERIOR APPROACH;  Surgeon: Kathryne Hitch, MD;  Location: [facility] ORS;  Service: Orthopedics;  Laterality: Left;     A IV Location/Drains/Wounds Patient Lines/Drains/Airways Status     Active Line/Drains/Airways     Name Placement date Placement time Site Days   Peripheral IV 05/06/23 18 G Left;Posterior Forearm 05/06/23  1859  Forearm  less than 1   Peripheral IV 05/06/23 18 G Right;Posterior Forearm 05/06/23  1900  Forearm  less than 1   Incision (Closed) 02/10/17 Hip Left 02/10/17  1129  -- 2276            Intake/Output Last 24 hours  Intake/Output Summary (Last 24 hours) at 05/06/2023 2218 Last data filed at 05/06/2023 2134  Gross per 24 hour  Intake 2350 ml  Output --  Net 2350 ml    Labs/Imaging Results for orders placed or performed during the hospital encounter of 05/06/23 (from the past 48 hour(s))  CBG monitoring, ED     Status: Abnormal   Collection Time: 05/06/23  6:44 PM  Result Value Ref Range   Glucose-Capillary 258 (H) 70 - 99 mg/dL    Comment: Glucose reference range applies only to samples taken after fasting for at least 8 hours.  CBC with Differential     Status: None   Collection Time: 05/06/23  6:45 PM  Result Value Ref Range   WBC 4.5 4.0 - 10.5 K/uL   RBC 4.37 4.22 - 5.81 MIL/uL   Hemoglobin 13.0 13.0 - 17.0 g/dL   HCT 16.1 09.6 - 04.5 %   MCV 95.9 80.0 - 100.0 fL   MCH 29.7 26.0 - 34.0 pg    MCHC 31.0 30.0 - 36.0 g/dL   RDW 40.9 81.1 - 91.4 %   Platelets 154 150 - 400 K/uL   nRBC 0.0 0.0 - 0.2 %   Neutrophils Relative % 67 %   Neutro Abs 3.1 1.7 - 7.7 K/uL   Lymphocytes Relative 20 %   Lymphs Abs 0.9 0.7 - 4.0 K/uL   Monocytes Relative 9 %   Monocytes Absolute 0.4 0.1 - 1.0 K/uL   Eosinophils Relative 3 %   Eosinophils Absolute 0.1 0.0 - 0.5 K/uL   Basophils Relative 1 %   Basophils Absolute 0.0 0.0 - 0.1 K/uL   Immature Granulocytes 0 %   Abs Immature Granulocytes 0.02 0.00 - 0.07 K/uL    Comment: Performed at [facility], [address] [place], [place] [address]  Comprehensive metabolic panel     Status: Abnormal   Collection Time: 05/06/23  6:45 PM  Result Value Ref Range   Sodium 135 135 - 145 mmol/L   Potassium 4.0 3.5 - 5.1 mmol/L   Chloride 97 (L) 98 - 111 mmol/L   CO2 31 22 - 32 mmol/L   Glucose, Bld 346 (H) 70 - 99 mg/dL    Comment: Glucose reference range applies only to samples taken after fasting for at least 8 hours.   BUN 26 (H) 8 - 23 mg/dL   Creatinine, Ser 6.21 (H) 0.61 - 1.24 mg/dL   Calcium 8.0 (L) 8.9 - 10.3 mg/dL   Total Protein 6.6 6.5 - 8.1 g/dL   Albumin 3.1 (L) 3.5 - 5.0 g/dL   AST 15 15 - 41 U/L   ALT 13 0 - 44 U/L   Alkaline Phosphatase 76 38 - 126 U/L   Total Bilirubin 0.7 0.3 - 1.2 mg/dL   GFR, Estimated 45 (L) >60 mL/min    Comment: (NOTE) Calculated using the CKD-EPI Creatinine Equation (2021)    Anion gap 7 5 - 15    Comment: Performed at [facility], [address], [place], [place] [address]  Troponin I (High Sensitivity)     Status: None   Collection Time: 05/06/23  6:45 PM  Result Value Ref Range   Troponin I (High Sensitivity) 10 <18 ng/L    Comment: (NOTE) Elevated high sensitivity troponin I (hsTnI) values and significant  changes across serial measurements may suggest ACS but many other  chronic and acute conditions are known to elevate hsTnI results.  Refer to the "Links" section for chest pain algorithms and  additional  guidance. Performed at [facility], [address] [place],  [place] [address]   Lactic acid, plasma     Status: Abnormal   Collection Time: 05/06/23  7:43 PM  Result Value Ref Range   Lactic Acid, Venous 2.4 (HH) 0.5 - 1.9 mmol/L    Comment: CRITICAL RESULT CALLED TO, READ BACK BY AND VERIFIED WITH TURNER, C AT 2033 ON 05/06/23 BY SMN. Performed at [facility], [address], [place], [place] [address]   Protime-INR     Status: None   Collection Time: 05/06/23  7:43 PM  Result Value Ref Range   Prothrombin Time 14.2 11.4 - 15.2 seconds   INR 1.1 0.8 - 1.2    Comment: (NOTE) INR goal varies based on device and disease states. Performed at [facility], [address], [place], [place] [address]   APTT     Status: None   Collection Time: 05/06/23  7:43 PM  Result Value Ref Range   aPTT 28 24 - 36 seconds    Comment: Performed at [facility], [address] [place], [place] [address]  Troponin I (High Sensitivity)     Status: None   Collection Time: 05/06/23  7:43 PM  Result Value Ref Range   Troponin I (High Sensitivity) 10 <18 ng/L    Comment: (NOTE) Elevated high sensitivity troponin I (hsTnI) values and significant  changes across serial measurements may suggest ACS but many other  chronic and acute conditions are known to elevate hsTnI results.  Refer to the "Links" section for chest pain algorithms and additional  guidance. Performed at [facility], [address], [place], [place] [address]   Resp panel by RT-PCR (RSV, Flu A&B, Covid) Anterior Nasal Swab     Status: None   Collection Time: 05/06/23  7:47 PM   Specimen: Anterior Nasal Swab  Result Value Ref Range   SARS Coronavirus 2 by RT PCR NEGATIVE NEGATIVE    Comment: (NOTE) SARS-CoV-2 target nucleic acids are NOT DETECTED.  The SARS-CoV-2 RNA is generally detectable in upper respiratory specimens during the acute phase of infection. The lowest concentration of SARS-CoV-2 viral copies this assay  can detect is 138 copies/mL. A negative result does not preclude SARS-Cov-2 infection and should not be used as the sole basis for treatment or other patient management decisions. A negative result may occur with  improper specimen collection/handling, submission of specimen other than nasopharyngeal swab, presence of viral mutation(s) within the areas targeted by this assay, and inadequate number of viral copies(<138 copies/mL). A negative result must be combined with clinical observations, patient history, and epidemiological information. The expected result is Negative.  Fact Sheet for Patients:  BloggerCourse.com  Fact Sheet for Healthcare Providers:  SeriousBroker.it  This test is no t yet approved or cleared by the [place] FDA and  has been authorized for detection and/or diagnosis of SARS-CoV-2 by FDA under an Emergency Use Authorization (EUA). This EUA will remain  in effect (meaning this test can be used) for the duration of the COVID-19 declaration under Section 564(b)(1) of the Act, 21 U.S.C.section 360bbb-3(b)(1), unless the authorization is terminated  or revoked sooner.       Influenza A by PCR NEGATIVE NEGATIVE   Influenza B by PCR NEGATIVE NEGATIVE    Comment: (NOTE) The Xpert Xpress SARS-CoV-2/FLU/RSV plus assay is intended as an aid in the diagnosis of influenza from Nasopharyngeal swab specimens and should not be used as a sole basis for treatment. Nasal washings and aspirates are unacceptable for Xpert Xpress SARS-CoV-2/FLU/RSV testing.  Fact Sheet for  Patients: BloggerCourse.com  Fact Sheet for Healthcare Providers: SeriousBroker.it  This test is not yet approved or cleared by the [place] FDA and has been authorized for detection and/or diagnosis of SARS-CoV-2 by FDA under an Emergency Use Authorization (EUA). This EUA will remain in effect  (meaning this test can be used) for the duration of the COVID-19 declaration under Section 564(b)(1) of the Act, 21 U.S.C. section 360bbb-3(b)(1), unless the authorization is terminated or revoked.     Resp Syncytial Virus by PCR NEGATIVE NEGATIVE    Comment: (NOTE) Fact Sheet for Patients: BloggerCourse.com  Fact Sheet for Healthcare Providers: SeriousBroker.it  This test is not yet approved or cleared by the [place] FDA and has been authorized for detection and/or diagnosis of SARS-CoV-2 by FDA under an Emergency Use Authorization (EUA). This EUA will remain in effect (meaning this test can be used) for the duration of the COVID-19 declaration under Section 564(b)(1) of the Act, 21 U.S.C. section 360bbb-3(b)(1), unless the authorization is terminated or revoked.  Performed at [facility], [address], [place], [place] [address]   Urinalysis, w/ Reflex to Culture (Infection Suspected) -Urine, Clean Catch     Status: Abnormal   Collection Time: 05/06/23  9:00 PM  Result Value Ref Range   Specimen Source URINE, CLEAN CATCH    Color, Urine STRAW (A) YELLOW   APPearance CLEAR CLEAR   Specific Gravity, Urine 1.020 1.005 - 1.030   pH 6.0 5.0 - 8.0   Glucose, UA >=500 (A) NEGATIVE mg/dL   Hgb urine dipstick NEGATIVE NEGATIVE   Bilirubin Urine NEGATIVE NEGATIVE   Ketones, ur NEGATIVE NEGATIVE mg/dL   Protein, ur NEGATIVE NEGATIVE mg/dL   Nitrite NEGATIVE NEGATIVE   Leukocytes,Ua NEGATIVE NEGATIVE   RBC / HPF 0-5 0 - 5 RBC/hpf   WBC, UA 0-5 0 - 5 WBC/hpf    Comment:        Reflex urine culture not performed if WBC <=10, OR if Squamous epithelial cells >5. If Squamous epithelial cells >5 suggest recollection.    Bacteria, UA NONE SEEN NONE SEEN   Squamous Epithelial / HPF 0-5 0 - 5 /HPF   Mucus PRESENT     Comment: Performed at [facility], [address] [place], [place] [address]  Lactic acid, plasma     Status:  None   Collection Time: 05/06/23  9:38 PM  Result Value Ref Range   Lactic Acid, Venous 1.6 0.5 - 1.9 mmol/L    Comment: Performed at [facility], [address], [place], [place] [address]   DG Toe Great Left  Result Date: 05/06/2023 CLINICAL DATA:  Status post trauma 2 days ago. EXAM: LEFT GREAT TOE COMPARISON:  None Available. FINDINGS: There is no evidence of fracture or dislocation. There is no evidence of arthropathy or other focal bone abnormality. Soft tissues are unremarkable. IMPRESSION: Negative. Electronically Signed   By: Aram Candela M.D.   On: 05/06/2023 19:36   DG Chest Port 1 View  Result Date: 05/06/2023 CLINICAL DATA:  Syncope and elevated temperature. EXAM: PORTABLE CHEST 1 VIEW COMPARISON:  February 10, 2017 FINDINGS: The cardiac silhouette is mildly enlarged and unchanged in size. Mild atelectasis and/or infiltrate is seen within the right lung base. There is no evidence of a pleural effusion or pneumothorax. The visualized skeletal structures are unremarkable. IMPRESSION: Mild right basilar atelectasis and/or infiltrate. Electronically Signed   By: Aram Candela M.D.   On: 05/06/2023 19:35    Pending Labs [employer] (From  admission, onward)     Start     Ordered   05/06/23 1909  Blood Culture (routine x 2)  (Septic presentation on arrival (screening labs, nursing and treatment orders for obvious sepsis))  BLOOD CULTURE X 2,   STAT      05/06/23 1909   Signed and Held  Basic metabolic panel  Tomorrow morning,   R        Signed and Held   Signed and Held  CBC  Tomorrow morning,   R        Signed and Held            Vitals/Pain Today's Vitals   05/06/23 2000 05/06/23 2126 05/06/23 2202 05/06/23 2204  BP: 129/60  126/68 129/62  Pulse: 94  87 97  Resp: 20  20 (!) 21  Temp:  98 F (36.7 C)    TempSrc:      SpO2: 93%  96% 94%  Weight:      Height:      PainSc:        Isolation Precautions No active isolations  Medications Medications   acetaminophen (TYLENOL) tablet 650 mg (650 mg Oral Not Given 05/06/23 1946)  cefTRIAXone (ROCEPHIN) 2 g in sodium chloride 0.9 % 100 mL IVPB (0 g Intravenous Stopped 05/06/23 2030)  sodium chloride 0.9 % bolus 1,000 mL (0 mLs Intravenous Stopped 05/06/23 1930)  sodium chloride 0.9 % bolus 1,000 mL (0 mLs Intravenous Stopped 05/06/23 2030)  azithromycin (ZITHROMAX) 500 mg in sodium chloride 0.9 % 250 mL IVPB (0 mg Intravenous Stopped 05/06/23 2134)    Mobility walks with device     Focused Assessments    R Recommendations: See Admitting Provider Note  Report given to:   Additional Notes: A&O; Ambu with walker at baseline; 2L O2 at baseline

## 2023-05-06 NOTE — ED Triage Notes (Signed)
Pt wife reports pt had been outside for an hour and then he slid out of a chair and was unresponsive.

## 2023-05-06 NOTE — ED Notes (Signed)
Pt's temp was rechecked at this time was 98.0 at this time blanket was given. Misty Stanley

## 2023-05-06 NOTE — H&P (Signed)
TRH H&P   Patient Demographics:    Ricky Johnston, is a 72 y.o. male  MRN: [id number]   DOB - Aug 20, 1951  Admit Date - 05/06/2023  Outpatient Primary MD for the patient is [name], MD  Referring MD/NP/PA: Dr Estell Harpin    Patient coming from: home  Chief Complaint  Patient presents with   Loss of Consciousness      HPI:    Ricky Johnston  is a 72 y.o. male, with history of upper extremity DVT, active bladder cancer (S/CP resection ), COPD, and insulin-dependent diabetes mellitus, currently Juliane CHF, chronic respiratory failure on 2 L oxygen. -Patient presents to ED secondary to episode of loss of consciousness, patient was outside the house today, when he was trying to stand up, he felt dizzy, lightheaded, where he lost consciousness for around 30 minutes, denies chest pain, reports chronic dyspnea, reports cough for last couple weeks, notices, -In ED patient was found to have soft blood pressure, 29/59, responded to fluid bolus, febrile 101.6, at baseline 2 L oxygen, tach acid elevated at 2.4, negative UA, negative COVID/influenza, blood cultures were sent, chest x-ray significant for right basilar atelectasis, [facility] hospitalist consulted to admit.   Review of systems:      A full 10 point Review of Systems was done, except as stated above, all other Review of Systems were negative.   With Past History of the following :    Past Medical History:  Diagnosis Date   Arthritis    Cancer (HCC)    bladder cancer currently 2016   Complication of anesthesia    hard for him to wake up from Anesthesia from left nephrectomy   COPD (chronic obstructive pulmonary disease) (HCC)    Diabetes mellitus without complication (HCC)    DVT (deep venous thrombosis) (HCC) 01/08/2014, 2010   upper extremity   GERD (gastroesophageal reflux disease)    Hallux limitus 05/18/2015    from notes from [facility]    Headache    migraines   History of kidney stones    Hypercholesterolemia    Hypertension    Impaired hearing    Intervertebral disc syndrome    Kidney stones    Pulmonary fibrosis (HCC)    Renal calculi 01/08/2014   frrom noted from [facility] .in chart   Restless legs 01/08/2014   Sciatic leg pain    paralysis of sciatic nerve   Sleep apnea    CPAP/BIPAP   Tinnitus       Past Surgical History:  Procedure Laterality Date   ABDOMINAL SURGERY     BACK SURGERY     BLADDER REPAIR     EYE SURGERY     HERNIA REPAIR     LEFT HEART CATHETERIZATION WITH CORONARY ANGIOGRAM N/A 01/09/2015   Procedure: LEFT HEART CATHETERIZATION WITH CORONARY ANGIOGRAM;  Surgeon: Robynn Pane, MD;  Location: [facility]  CATH LAB;  Service: Cardiovascular;  Laterality: N/A;   NEPHROURETERECTOMY  03/18/2016   with bladder cuff excision- from noted in chart from [facility]   TOTAL HIP ARTHROPLASTY Left 02/10/2017   Procedure: LEFT TOTAL HIP ARTHROPLASTY ANTERIOR APPROACH;  Surgeon: Kathryne Hitch, MD;  Location: [facility];  Service: Orthopedics;  Laterality: Left;      Social History:     Social History   Tobacco Use   Smoking status: Former    Packs/day: 1.00    Years: 33.00    Additional pack years: 0.00    Total pack years: 33.00    Types: Cigarettes    Quit date: 04/06/2000    Years since quitting: 23.0   Smokeless tobacco: Never   Tobacco comments:    use to chew cigars  Substance Use Topics   Alcohol use: Not Currently    Comment: rarely       Family History :    History reviewed. No pertinent family history.    Home Medications:   Prior to Admission medications   Medication Sig Start Date End Date Taking? Authorizing Provider  acetaminophen (TYLENOL) 325 MG tablet Take 3 tablets (975 mg total) by mouth every 8 (eight) hours as needed for mild pain. Patient taking differently: Take 975 mg by mouth 2 (two) times daily as needed for mild pain.  01/09/15    Vassie Loll, MD  albuterol (PROVENTIL HFA;VENTOLIN HFA) 108 (90 Base) MCG/ACT inhaler Inhale 2 puffs into the lungs every 6 (six) hours as needed for wheezing or shortness of breath.    [provider]  ammonium lactate (LAC-HYDRIN) 12 % lotion Apply 1 application topically daily as needed for dry skin.    [provider]  aspirin 81 MG chewable tablet Chew 1 tablet (81 mg total) by mouth 2 (two) times daily after a meal. 02/14/17   Kathryne Hitch, MD  atorvastatin (LIPITOR) 80 MG tablet Take 80 mg by mouth at bedtime.    [provider]  cetirizine (ZYRTEC) 10 MG tablet Take 10 mg by mouth daily as needed for allergies.    [provider]  cholecalciferol (VITAMIN D) 1000 UNITS tablet Take 1,000 Units by mouth daily.     [provider]  diclofenac Sodium (VOLTAREN) 1 % GEL Apply topically. 12/31/20   [provider]  ferrous sulfate 325 (65 FE) MG tablet Take 325 mg by mouth daily.     [provider]  fluticasone Aleda Grana) 50 MCG/ACT nasal spray  07/25/18   [provider]  Fluticasone-Salmeterol (ADVAIR) 250-50 MCG/DOSE AEPB INHALE 1 PUFF BY ORAL INHALATION TWO TIMES A DAY **REPLACES SYMBICORT INHALER** 08/24/20   [provider]  furosemide (LASIX) 20 MG tablet Take 40 mg by mouth daily.     [provider]  gabapentin (NEURONTIN) 300 MG capsule Take 600 mg by mouth 2 (two) times daily.     [provider]  HUMULIN R U-500 KWIKPEN 500 UNIT/ML kwikpen 3 (three) times daily with meals. 110 unit with breakfast 110 units with lunch 50-60 units with supper 09/14/18   [provider]  hydrocortisone 2.5 % lotion Apply topically.    [provider]  ketoconazole (NIZORAL) 2 % shampoo Apply topically.    [provider]  losartan (COZAAR) 25 MG tablet Take 25 mg by mouth daily.    [provider]  metFORMIN (GLUCOPHAGE) 1000 MG tablet Take 1,000 mg by mouth 2  (two) times daily with a meal.  [provider]  omeprazole (PRILOSEC) 20 MG capsule Take 20 mg by mouth 2 (two) times daily before a meal.    [provider]  OXYGEN Inhale 1 L into the lungs continuous. At night time may increase to 2 L as needed for shortness of breath    [provider]  rOPINIRole (REQUIP) 0.5 MG tablet Take 1 tablet by mouth at bedtime. 05/26/21 05/27/22  [provider]  rOPINIRole (REQUIP) 1 MG tablet Take 1 tablet by mouth at bedtime. 09/25/20 05/27/22  [provider]  selenium sulfide (SELSUN) 2.5 % shampoo Apply topically.    [provider]  SEMAGLUTIDE, 1 MG/DOSE, [place] Inject 1 mg into the skin once a week. 05/26/21   [provider]  sodium chloride (ALTAMIST SPRAY) 0.65 % nasal spray Place into the nose.    [provider]  tamsulosin (FLOMAX) 0.4 MG CAPS capsule Take 0.4 mg by mouth daily.    [provider]  tiotropium (SPIRIVA HANDIHALER) 18 MCG inhalation capsule Place 18 mcg into inhaler and inhale daily.    [provider]     Allergies:     Allergies  Allergen Reactions   Poison Ivy Extract [Poison Ivy Extract] Rash   Poison Oak Extract Rash     Physical Exam:   Vitals  Blood pressure 129/60, pulse 94, temperature 98 F (36.7 C), resp. rate 20, height 5\' 8"  (1.727 m), weight 118.8 kg, SpO2 93 %.   1. General ill-appearing male, with morbid obesity, laying in bed NAD  2. Normal affect and insight, Not Suicidal or Homicidal, Awake Alert, Oriented X 3.  3. No F.N deficits, ALL C.Nerves Intact, Strength 5/5 all 4 extremities, Sensation intact all 4 extremities, Plantars down going.  4. Ears and Eyes appear Normal, Conjunctivae clear, PERRLA. Moist Oral Mucosa.  5. Supple Neck, No JVD, No cervical lymphadenopathy appriciated, No Carotid Bruits.  6. Symmetrical Chest wall movement, Good air movement bilaterally, diminished air entry at the bases with some  rales  7. RRR, No Gallops, Rubs or Murmurs, No Parasternal Heave.  Trace edema  8. Positive Bowel Sounds, Abdomen Soft, No tenderness, No organomegaly appriciated,No rebound -guarding or rigidity.  9.  No Cyanosis, Normal Skin Turgor, No Skin Rash or Bruise.  10. Good muscle tone,  joints appear normal , no effusions, Normal ROM.     Data Review:    CBC Recent Labs  Lab 05/06/23 1845  WBC 4.5  HGB 13.0  HCT 41.9  PLT 154  MCV 95.9  MCH 29.7  MCHC 31.0  RDW 13.6  LYMPHSABS 0.9  MONOABS 0.4  EOSABS 0.1  BASOSABS 0.0   ------------------------------------------------------------------------------------------------------------------  Chemistries  Recent Labs  Lab 05/06/23 1845  NA 135  K 4.0  CL 97*  CO2 31  GLUCOSE 346*  BUN 26*  CREATININE 1.60*  CALCIUM 8.0*  AST 15  ALT 13  ALKPHOS 76  BILITOT 0.7   ------------------------------------------------------------------------------------------------------------------ estimated creatinine clearance is 52.3 mL/min (A) (by C-G formula based on SCr of 1.6 mg/dL (H)). ------------------------------------------------------------------------------------------------------------------ No results for input(s): "TSH", "T4TOTAL", "T3FREE", "THYROIDAB" in the last 72 hours.  Invalid input(s): "FREET3"  Coagulation profile Recent Labs  Lab 05/06/23 1943  INR 1.1   ------------------------------------------------------------------------------------------------------------------- No results for input(s): "DDIMER" in the last 72 hours. -------------------------------------------------------------------------------------------------------------------  Cardiac Enzymes No results for input(s): "CKMB", "TROPONINI", "MYOGLOBIN" in the last 168 hours.  Invalid input(s): "CK" ------------------------------------------------------------------------------------------------------------------    Component Value Date/Time    BNP 13.9 01/08/2015 1331     ---------------------------------------------------------------------------------------------------------------  Urinalysis    Component Value Date/Time   COLORURINE STRAW (A) 05/06/2023 2100   APPEARANCEUR CLEAR 05/06/2023 2100   LABSPEC 1.020 05/06/2023 2100   PHURINE 6.0 05/06/2023 2100   GLUCOSEU >=500 (A) 05/06/2023 2100   HGBUR NEGATIVE 05/06/2023 2100   BILIRUBINUR NEGATIVE 05/06/2023 2100   KETONESUR NEGATIVE 05/06/2023 2100   PROTEINUR NEGATIVE 05/06/2023 2100   NITRITE NEGATIVE 05/06/2023 2100   LEUKOCYTESUR NEGATIVE 05/06/2023 2100    ----------------------------------------------------------------------------------------------------------------   Imaging Results:    DG Toe Great Left  Result Date: 05/06/2023 CLINICAL DATA:  Status post trauma 2 days ago. EXAM: LEFT GREAT TOE COMPARISON:  None Available. FINDINGS: There is no evidence of fracture or dislocation. There is no evidence of arthropathy or other focal bone abnormality. Soft tissues are unremarkable. IMPRESSION: Negative. Electronically Signed   By: Aram Candela M.D.   On: 05/06/2023 19:36   DG Chest Port 1 View  Result Date: 05/06/2023 CLINICAL DATA:  Syncope and elevated temperature. EXAM: PORTABLE CHEST 1 VIEW COMPARISON:  February 10, 2017 FINDINGS: The cardiac silhouette is mildly enlarged and unchanged in size. Mild atelectasis and/or infiltrate is seen within the right lung base. There is no evidence of a pleural effusion or pneumothorax. The visualized skeletal structures are unremarkable. IMPRESSION: Mild right basilar atelectasis and/or infiltrate. Electronically Signed   By: Aram Candela M.D.   On: 05/06/2023 19:35    EKG:  Vent. rate 106 BPM PR interval 175 ms QRS duration 112 ms QT/QTcB 320/425 ms P-R-T axes 62 96 29 Sinus tachycardia Supraventricular bigeminy Borderline intraventricular conduction delay  Assessment & Plan:    Principal Problem:   Sepsis  (HCC) Active Problems:   COPD (chronic obstructive pulmonary disease) (HCC)   Chronic systolic heart failure (HCC)   BPH (benign prostatic hyperplasia)   CAD (coronary artery disease)   HLD (hyperlipidemia)   HTN (hypertension)   Morbid obesity with BMI of 40.0-44.9, adult (HCC)   Medication reconciliation has been pending at time of admission, still waiting to be completed before resuming appropriate home indications.  Essential medications has been resumed including insulin.     Syncope -This is most likely in the setting of sepsis, acute illness and low blood pressure -Continue with IV fluids, hold Lasix and antihypertensive medications -Treat infection -Trend telemetry  Sepsis, present on admission, secondary to pneumonia -febrile 101.6, elevated lactic acid and hypotensive -Follow on blood cultures, sputum cultures -Continue with IV Rocephin and azithromycin  -He was encouraged to use incentive spirometer, flutter valve  History of COPD -No active wheezing, continue with home inhalers, and will add as needed hydralazine  Chronic diastolic CHF -Will hold Lasix in the setting of low blood pressure, monitor closely as he will be kept on gentle hydration  Diabetes  mellitus, insulin-dependent-uncontrolled with hyperglycemia - Will check A1c, hold Jardiance and Mounjaro given low blood pressure, will keep an insulin sliding scale, resume home insulin at a lower dose( will do around 60 % of home dose ( he is on U500)  Generalized weakness, deconditioning -Will consult PT/OT  CKd stage IIIa -Monitor renal function   Hyperlipidemia -Continue with home dose statin   Hypertension -Given soft blood pressure on presentation  GERD -continue PPI  Morbid Obesity   Restless leg syndrome -Continue with home Requip   DVT Prophylaxis Heparin   AM Labs Ordered, also please review Full Orders  Family Communication: Admission, patients condition and plan of care including  tests being ordered have been discussed with the patient  and wif3who indicate understanding and agree with the plan and Code Status.  Code Status full  Likely DC to  home  Condition GUARDED    Consults called: none    Admission status: inpatient    Time spent in minutes : 70 minutes   Huey Bienenstock M.D on 05/06/2023 at 10:00 PM   [facility] - Office  [phone]

## 2023-05-06 NOTE — ED Provider Notes (Incomplete)
[place] EMERGENCY DEPARTMENT AT [facility] Provider Note   CSN: [id number] Arrival date & time: 05/06/23  1841     History {Add pertinent medical, surgical, social history, OB history to HPI:1} Chief Complaint  Patient presents with   Loss of Consciousness    Ricky Johnston. is a 72 y.o. male.  Patient has a history of COPD and hypertension and diabetes.  He was outside on the porch and was feeling weak and passed out for about half hour.   Loss of Consciousness      Home Medications Prior to Admission medications   Medication Sig Start Date End Date Taking? Authorizing Provider  acetaminophen (TYLENOL) 325 MG tablet Take 3 tablets (975 mg total) by mouth every 8 (eight) hours as needed for mild pain. Patient taking differently: Take 975 mg by mouth 2 (two) times daily as needed for mild pain.  01/09/15   Vassie Loll, MD  albuterol (PROVENTIL HFA;VENTOLIN HFA) 108 (90 Base) MCG/ACT inhaler Inhale 2 puffs into the lungs every 6 (six) hours as needed for wheezing or shortness of breath.    [provider]  ammonium lactate (LAC-HYDRIN) 12 % lotion Apply 1 application topically daily as needed for dry skin.    [provider]  aspirin 81 MG chewable tablet Chew 1 tablet (81 mg total) by mouth 2 (two) times daily after a meal. 02/14/17   Kathryne Hitch, MD  atorvastatin (LIPITOR) 80 MG tablet Take 80 mg by mouth at bedtime.    [provider]  cetirizine (ZYRTEC) 10 MG tablet Take 10 mg by mouth daily as needed for allergies.    [provider]  cholecalciferol (VITAMIN D) 1000 UNITS tablet Take 1,000 Units by mouth daily.     [provider]  diclofenac Sodium (VOLTAREN) 1 % GEL Apply topically. 12/31/20   [provider]  ferrous sulfate 325 (65 FE) MG tablet Take 325 mg by mouth daily.     [provider]  fluticasone Aleda Grana) 50 MCG/ACT nasal spray  07/25/18   [provider]   Fluticasone-Salmeterol (ADVAIR) 250-50 MCG/DOSE AEPB INHALE 1 PUFF BY ORAL INHALATION TWO TIMES A DAY **REPLACES SYMBICORT INHALER** 08/24/20   [provider]  furosemide (LASIX) 20 MG tablet Take 40 mg by mouth daily.     [provider]  gabapentin (NEURONTIN) 300 MG capsule Take 600 mg by mouth 2 (two) times daily.     [provider]  HUMULIN R U-500 KWIKPEN 500 UNIT/ML kwikpen 3 (three) times daily with meals. 110 unit with breakfast 110 units with lunch 50-60 units with supper 09/14/18   [provider]  hydrocortisone 2.5 % lotion Apply topically.    [provider]  ketoconazole (NIZORAL) 2 % shampoo Apply topically.    [provider]  losartan (COZAAR) 25 MG tablet Take 25 mg by mouth daily.    [provider]  metFORMIN (GLUCOPHAGE) 1000 MG tablet Take 1,000 mg by mouth 2 (two) times daily with a meal.    [provider]  omeprazole (PRILOSEC) 20 MG capsule Take 20 mg by mouth 2 (two) times daily before a meal.    [provider]  OXYGEN Inhale 1 L into the lungs continuous. At night time may increase to 2 L as needed for shortness of breath    [provider]  rOPINIRole (REQUIP) 0.5 MG tablet Take 1 tablet by mouth at bedtime. 05/26/21 05/27/22  [provider]  rOPINIRole (  REQUIP) 1 MG tablet Take 1 tablet by mouth at bedtime. 09/25/20 05/27/22  [provider]  selenium sulfide (SELSUN) 2.5 % shampoo Apply topically.    [provider]  SEMAGLUTIDE, 1 MG/DOSE, [place] Inject 1 mg into the skin once a week. 05/26/21   [provider]  sodium chloride (ALTAMIST SPRAY) 0.65 % nasal spray Place into the nose.    [provider]  tamsulosin (FLOMAX) 0.4 MG CAPS capsule Take 0.4 mg by mouth daily.    [provider]  tiotropium (SPIRIVA HANDIHALER) 18 MCG inhalation capsule Place 18 mcg into inhaler and inhale daily.    [provider]       Allergies    Poison ivy extract [poison ivy extract] and Poison oak extract    Review of Systems   Review of Systems  Cardiovascular:  Positive for syncope.    Physical Exam Updated Vital Signs BP 129/60   Pulse 94   Temp 98 F (36.7 C)   Resp 20   Ht 5\' 8"  (1.727 m)   Wt 118.8 kg   SpO2 93%   BMI 39.84 kg/m  Physical Exam  ED Results / Procedures / Treatments   Labs (all labs ordered are listed, but only abnormal results are displayed) Labs Reviewed  COMPREHENSIVE METABOLIC PANEL - Abnormal; Notable for the following components:      Result Value   Chloride 97 (*)    Glucose, Bld 346 (*)    BUN 26 (*)    Creatinine, Ser 1.60 (*)    Calcium 8.0 (*)    Albumin 3.1 (*)    GFR, Estimated 45 (*)    All other components within normal limits  LACTIC ACID, PLASMA - Abnormal; Notable for the following components:   Lactic Acid, Venous 2.4 (*)    All other components within normal limits  URINALYSIS, W/ REFLEX TO CULTURE (INFECTION SUSPECTED) - Abnormal; Notable for the following components:   Color, Urine STRAW (*)    Glucose, UA >=500 (*)    All other components within normal limits  CBG MONITORING, ED - Abnormal; Notable for the following components:   Glucose-Capillary 258 (*)    All other components within normal limits  RESP PANEL BY RT-PCR (RSV, FLU A&B, COVID)  RVPGX2  CULTURE, BLOOD (ROUTINE X 2)  CULTURE, BLOOD (ROUTINE X 2)  CBC WITH DIFFERENTIAL/PLATELET  LACTIC ACID, PLASMA  PROTIME-INR  APTT  TROPONIN I (HIGH SENSITIVITY)  TROPONIN I (HIGH SENSITIVITY)    EKG None  Radiology DG Toe Great Left  Result Date: 05/06/2023 CLINICAL DATA:  Status post trauma 2 days ago. EXAM: LEFT GREAT TOE COMPARISON:  None Available. FINDINGS: There is no evidence of fracture or dislocation. There is no evidence of arthropathy or other focal bone abnormality. Soft tissues are unremarkable. IMPRESSION: Negative. Electronically Signed   By: Aram Candela M.D.   On:  05/06/2023 19:36   DG Chest Port 1 View  Result Date: 05/06/2023 CLINICAL DATA:  Syncope and elevated temperature. EXAM: PORTABLE CHEST 1 VIEW COMPARISON:  February 10, 2017 FINDINGS: The cardiac silhouette is mildly enlarged and unchanged in size. Mild atelectasis and/or infiltrate is seen within the right lung base. There is no evidence of a pleural effusion or pneumothorax. The visualized skeletal structures are unremarkable. IMPRESSION: Mild right basilar atelectasis and/or infiltrate. Electronically Signed   By: Aram Candela M.D.   On: 05/06/2023 19:35    Procedures Procedures  {Document cardiac monitor, telemetry assessment procedure when appropriate:1}  Medications Ordered in ED Medications  acetaminophen (TYLENOL) tablet 650 mg (650 mg Oral Not Given 05/06/23 1946)  cefTRIAXone (ROCEPHIN) 2 g in sodium chloride 0.9 % 100 mL IVPB (0 g Intravenous Stopped 05/06/23 2030)  sodium chloride 0.9 % bolus 1,000 mL (0 mLs Intravenous Stopped 05/06/23 1930)  sodium chloride 0.9 % bolus 1,000 mL (0 mLs Intravenous Stopped 05/06/23 2030)  azithromycin (ZITHROMAX) 500 mg in sodium chloride 0.9 % 250 mL IVPB (0 mg Intravenous Stopped 05/06/23 2134)    ED Course/ Medical Decision Making/ A&P   {  CRITICAL CARE Performed by: Bethann Berkshire Total critical care time: 40 minutes Critical care time was exclusive of separately billable procedures and treating other patients. Critical care was necessary to treat or prevent imminent or life-threatening deterioration. Critical care was time spent personally by me on the following activities: development of treatment plan with patient and/or surrogate as well as nursing, discussions with consultants, evaluation of patient's response to treatment, examination of patient, obtaining history from patient or surrogate, ordering and performing treatments and interventions, ordering and review of laboratory studies, ordering and review of radiographic studies, pulse oximetry  and re-evaluation of patient's condition.   Click here for ABCD2, HEART and other calculatorsREFRESH Note before signing :1}                          Medical Decision Making Amount and/or Complexity of Data Reviewed Labs: ordered. Radiology: ordered. ECG/medicine tests: ordered.  Risk OTC drugs. Decision regarding hospitalization.   Patient with syncope and fever and possible pneumonia.  He is being admitted to medicine  {Document critical care time when appropriate:1} {Document review of labs and clinical decision tools ie heart score, Chads2Vasc2 etc:1}  {Document your independent review of radiology images, and any outside records:1} {Document your discussion with family members, caretakers, and with consultants:1} {Document social determinants of health affecting pt's care:1} {Document your decision making why or why not admission, treatments were needed:1} Final Clinical Impression(s) / ED Diagnoses Final diagnoses:  Syncope and collapse  Community acquired pneumonia, unspecified laterality    Rx / DC Orders ED Discharge Orders     None

## 2023-05-07 ENCOUNTER — Observation Stay (HOSPITAL_COMMUNITY): Payer: Medicare PPO

## 2023-05-07 DIAGNOSIS — I159 Secondary hypertension, unspecified: Secondary | ICD-10-CM

## 2023-05-07 DIAGNOSIS — J438 Other emphysema: Secondary | ICD-10-CM | POA: Diagnosis not present

## 2023-05-07 DIAGNOSIS — E669 Obesity, unspecified: Secondary | ICD-10-CM | POA: Diagnosis present

## 2023-05-07 DIAGNOSIS — R55 Syncope and collapse: Secondary | ICD-10-CM | POA: Diagnosis not present

## 2023-05-07 DIAGNOSIS — I5022 Chronic systolic (congestive) heart failure: Secondary | ICD-10-CM | POA: Diagnosis not present

## 2023-05-07 DIAGNOSIS — N1831 Chronic kidney disease, stage 3a: Secondary | ICD-10-CM

## 2023-05-07 DIAGNOSIS — E1169 Type 2 diabetes mellitus with other specified complication: Secondary | ICD-10-CM

## 2023-05-07 LAB — BASIC METABOLIC PANEL
Anion gap: 8 (ref 5–15)
BUN: 22 mg/dL (ref 8–23)
CO2: 30 mmol/L (ref 22–32)
Calcium: 7.7 mg/dL — ABNORMAL LOW (ref 8.9–10.3)
Chloride: 104 mmol/L (ref 98–111)
Creatinine, Ser: 1.34 mg/dL — ABNORMAL HIGH (ref 0.61–1.24)
GFR, Estimated: 56 mL/min — ABNORMAL LOW (ref >60)
Glucose, Bld: 237 mg/dL — ABNORMAL HIGH (ref 70–99)
Potassium: 4.2 mmol/L (ref 3.5–5.1)
Sodium: 142 mmol/L (ref 135–145)

## 2023-05-07 LAB — HEMOGLOBIN A1C
Hgb A1c MFr Bld: 8.6 % — ABNORMAL HIGH (ref 4.8–5.6)
Mean Plasma Glucose: 200.12 mg/dL

## 2023-05-07 LAB — CBC
HCT: 42.1 % (ref 39.0–52.0)
Hemoglobin: 12.5 g/dL — ABNORMAL LOW (ref 13.0–17.0)
MCH: 29.6 pg (ref 26.0–34.0)
MCHC: 29.7 g/dL — ABNORMAL LOW (ref 30.0–36.0)
MCV: 99.5 fL (ref 80.0–100.0)
Platelets: 139 10*3/uL — ABNORMAL LOW (ref 150–400)
RBC: 4.23 MIL/uL (ref 4.22–5.81)
RDW: 13.9 % (ref 11.5–15.5)
WBC: 4.8 10*3/uL (ref 4.0–10.5)
nRBC: 0 % (ref 0.0–0.2)

## 2023-05-07 LAB — GLUCOSE, CAPILLARY
Glucose-Capillary: 200 mg/dL — ABNORMAL HIGH (ref 70–99)
Glucose-Capillary: 194 mg/dL — ABNORMAL HIGH (ref 70–99)
Glucose-Capillary: 219 mg/dL — ABNORMAL HIGH (ref 70–99)
Glucose-Capillary: 156 mg/dL — ABNORMAL HIGH (ref 70–99)

## 2023-05-07 LAB — STREP PNEUMONIAE URINARY ANTIGEN: Strep Pneumo Urinary Antigen: NEGATIVE

## 2023-05-07 MED ORDER — POLYETHYLENE GLYCOL 3350 17 G PO PACK
17.0000 g | PACK | Freq: Every day | ORAL | Status: DC
  Administered 2023-05-07: 17 g via ORAL
  Filled 2023-05-07 (×4): qty 1

## 2023-05-07 MED ORDER — AZITHROMYCIN 250 MG PO TABS: 500.0000 mg | ORAL_TABLET | Freq: Every day | ORAL | Status: DC

## 2023-05-07 MED ORDER — INSULIN ASPART 100 UNIT/ML IJ SOLN
0.0000 [IU] | Freq: Three times a day (TID) | INTRAMUSCULAR | Status: DC
  Administered 2023-05-07: 5 [IU] via SUBCUTANEOUS
  Administered 2023-05-08 (×2): 3 [IU] via SUBCUTANEOUS
  Administered 2023-05-09: 2 [IU] via SUBCUTANEOUS
  Administered 2023-05-09: 3 [IU] via SUBCUTANEOUS
  Administered 2023-05-09 – 2023-05-10 (×2): 2 [IU] via SUBCUTANEOUS
  Administered 2023-05-10: 8 [IU] via SUBCUTANEOUS

## 2023-05-07 NOTE — Care Management Obs Status (Signed)
MEDICARE OBSERVATION STATUS NOTIFICATION   Patient Details  Name: Ricky PINK Sr. MRN: [id number] Date of Birth: Mar 05, 1951   Medicare Observation Status Notification Given:  Yes    Jolicia Delira Marsh Dolly, LCSW 05/07/2023, 2:21 PM

## 2023-05-07 NOTE — Assessment & Plan Note (Signed)
No signs of urinary retention  

## 2023-05-07 NOTE — Assessment & Plan Note (Signed)
Continue blood pressure monitoring Hold antihypertensive medications, including diuretics.

## 2023-05-07 NOTE — Assessment & Plan Note (Addendum)
Orthostatic/ vasovagal syncope.   Systolic blood pressure 118 to 126 mmHg.  Today patient has been afebrile.  Wbc is 4,8.   Possible pneumonia but not clear infiltrate on chest radiograph. Plan to recheck chest radiograph today. Hold antibiotic therapy for now.  At this point in time sepsis has been ruled out.  Out of bed to chair tid with meals, Pt and Ot.

## 2023-05-07 NOTE — Assessment & Plan Note (Signed)
Calculated BMI is 39,8  ?

## 2023-05-07 NOTE — Progress Notes (Addendum)
Progress Note   Patient: Ricky Johnston ZOX:[id number] DOB: [birth year] DOA: 05/06/2023     0 DOS: the patient was seen and examined on 05/07/2023   Brief hospital course: Ricky Johnston was admitted to the hospital with the working diagnosis syncope.   72 yo male with the past medical history of active bladder cancer sp resection, COPD, T2DM, heart failure, and upper extremity deep vein thrombosis who presented with a syncope episode. Patient was outside his house, and when he was trying to stand up, felt dizzy, lightheaded and loss his consciousness for about 30 minutes. He had been working his wife in his garage, and outside temperature was particularly high. He uses a walker for ambulation and is on continuous supplemental 02 per [place].    EMS was called and he was found hypotensive, and febrile, he received IV fluids and was transported to the ED. On his initial physical examination he was febrile 101.6 F,  blood pressure  129/60, HR 94, RR 20 and 02 saturation 93%, lungs with no wheezing, decreased breath sounds at bases with positive rales, heart with S1 and S2 present and regular, no murmur or gallops, abdomen with no distention and no lower extremity edema.   Na 135, K 4,0 Cl 97, bicarbonate 31, glucose 346, bun 26, cr 1,60  AST 15. ALT 13  High sensitive troponin 10 and 10 Lactic acid 2,4  Wbc 4,5 hgb 13,0 plt 154  Urine analysis with SG 1,020, 0-5 wbc, 0-5 rbc. Negative protein and glucose > 500   Chest radiograph with mild cardiomegaly with bilateral faint interstitial infiltrates at bases, bilaterally.   Assessment and Plan: * Syncope Orthostatic/ vasovagal syncope.   Systolic blood pressure 118 to 126 mmHg.  Today patient has been afebrile.  Wbc is 4,8.   Possible pneumonia but not clear infiltrate on chest radiograph. Plan to recheck chest radiograph today. Hold antibiotic therapy for now.  At this point in time sepsis has been ruled out.  Out of bed to chair tid with meals,  Pt and Ot.   COPD (chronic obstructive pulmonary disease) (HCC) Patient has chronic cough and chronic dyspnea.  No clear signs of acute exacerbation. Continue supplemental 02 per [place] and bronchodilator therapy.   Chronic systolic heart failure (HCC) Trace non pitting lower extremity edema Will hold on IV fluids Hold on diuretic therapy for now. Continue blood pressure monitoring.   HTN (hypertension) Continue blood pressure monitoring Hold antihypertensive medications, including diuretics.   CAD (coronary artery disease) No chest pain No acute coronary syndrome.   CKD stage 3a, GFR 45-59 ml/min (HCC) Renal function with serum cr at 1,34 with K at 4,2 and serum bicarbonate at 30. Na 142. Plan to hold on IV fluids and follow up renal functio in am. Continue to hold on diuretic therapy for now.   Type 2 diabetes mellitus with hyperlipidemia (HCC) Uncontrolled T2DM with hyperglycemia.   Insulin therapy for glucose control. Add sliding scale.  Fasting glucose today 237.   Continue with statin therapy.   BPH (benign prostatic hyperplasia) No signs of urinary retention.   Class 2 obesity Calculated BMI is 39,8         Subjective: Patient with no chest pain or dyspnea. No worsening cough or wheezing. Continue to have left foot 1st toe pain.   Physical Exam: Vitals:   05/07/23 0713 05/07/23 0753 05/07/23 1104 05/07/23 1315  BP: 125/65  118/65 129/66  Pulse: 89  78 76  Resp: (!) 21  Temp: 98.4 F (36.9 C)  98.4 F (36.9 C) 98 F (36.7 C)  TempSrc: Oral  Oral Oral  SpO2: 93% 93% 93% 94%  Weight:      Height:       Neurology awake and alert ENT with no pallor Cardiovascular with S1 and S2 present and regular with no gallops, rubs or murmurs Respiratory with rales at bases bilaterally with no wheezing or rhonchi.  Abdomen with no distention.  Trace lower extremity edema  Data Reviewed:    Family Communication: no family at the bedside    Disposition: Status is: Observation The patient remains OBS appropriate and will d/c before 2 midnights.  Planned Discharge Destination: Home      Author: Coralie Keens, MD 05/07/2023 1:37 PM  For on call review [email or web address].

## 2023-05-07 NOTE — Assessment & Plan Note (Signed)
Patient has chronic cough and chronic dyspnea.  No clear signs of acute exacerbation. Continue supplemental 02 per [place] and bronchodilator therapy.

## 2023-05-07 NOTE — Assessment & Plan Note (Addendum)
Uncontrolled T2DM with hyperglycemia.   Insulin therapy for glucose control. Add sliding scale.  Fasting glucose today 237.   Continue with statin therapy.

## 2023-05-07 NOTE — Assessment & Plan Note (Signed)
Trace non pitting lower extremity edema Will hold on IV fluids Hold on diuretic therapy for now. Continue blood pressure monitoring.

## 2023-05-07 NOTE — Progress Notes (Signed)
Patient admitted this shift. Patient slept majority of the shift waking for vitals and to take medications. No complaints of pain, with exception of pain to Left great toe when touched, bruising noted on arrival to floor. Continued to monitor.

## 2023-05-07 NOTE — Progress Notes (Signed)
Pt assisted to the [facility] and back to the bed x1. Patient is not in bed with call light within reach resting. No c/o pain or anything at this time.

## 2023-05-07 NOTE — Hospital Course (Addendum)
[name] was admitted to the hospital with the working diagnosis syncope.   72 yo male with the past medical history of active bladder cancer sp resection, COPD, T2DM, heart failure, and upper extremity deep vein thrombosis who presented with a syncope episode. Patient was outside his house, and when he was trying to stand up, felt dizzy, lightheaded and loss his consciousness for about 30 minutes. He had been working his wife in his garage, and outside temperature was particularly high. He uses a walker for ambulation and is on continuous supplemental 02 per [place].    EMS was called and he was found hypotensive, and febrile, he received IV fluids and was transported to the ED. On his initial physical examination he was febrile 101.6 F,  blood pressure  129/60, HR 94, RR 20 and 02 saturation 93%, lungs with no wheezing, decreased breath sounds at bases with positive rales, heart with S1 and S2 present and regular, no murmur or gallops, abdomen with no distention and no lower extremity edema.   Na 135, K 4,0 Cl 97, bicarbonate 31, glucose 346, bun 26, cr 1,60  AST 15. ALT 13  High sensitive troponin 10 and 10 Lactic acid 2,4  Wbc 4,5 hgb 13,0 plt 154  Urine analysis with SG 1,020, 0-5 wbc, 0-5 rbc. Negative protein and glucose > 500   Chest radiograph with mild cardiomegaly with bilateral faint interstitial infiltrates at bases, bilaterally.

## 2023-05-07 NOTE — Assessment & Plan Note (Signed)
Renal function with serum cr at 1,34 with K at 4,2 and serum bicarbonate at 30. Na 142. Plan to hold on IV fluids and follow up renal functio in am. Continue to hold on diuretic therapy for now.

## 2023-05-07 NOTE — Assessment & Plan Note (Signed)
No chest pain No acute coronary syndrome.

## 2023-05-07 NOTE — TOC Initial Note (Addendum)
Transition of Care (TOC) - Initial/Assessment Note    Patient Details  Name: Ricky RHUE Sr. MRN: [id number] Date of Birth: Nov 16, 1951  Transition of Care [facility]) CM/SW Contact:    Catalina Gravel, LCSW Phone Number: 05/07/2023, 11:14 AM  Clinical Narrative:                 Pt arrived to [facility] private vehicle, resides with spouse.   Continued medical work up, TOC to follow.    Barriers to Discharge: Continued Medical Work up   Patient Goals and CMS Choice            Expected Discharge Plan and Services                                              Prior Living Arrangements/Services                       Activities of Daily Living Home Assistive Devices/Equipment: Environmental consultant (specify type), Shower chair with back ADL Screening (condition at time of admission) Patient's cognitive ability adequate to safely complete daily activities?: Yes Is the patient deaf or have difficulty hearing?: No Does the patient have difficulty seeing, even when wearing glasses/contacts?: No Does the patient have difficulty concentrating, remembering, or making decisions?: No Patient able to express need for assistance with ADLs?: Yes Does the patient have difficulty dressing or bathing?: No Independently performs ADLs?: Yes (appropriate for developmental age) Walks in Home: Independent with device (comment) Does the patient have difficulty walking or climbing stairs?: No (Patient stated he does not have any stairs.) Weakness of Legs: Both Weakness of Arms/Hands: None  Permission Sought/Granted                  Emotional Assessment              Admission diagnosis:  Syncope and collapse [R55] Sepsis (HCC) [A41.9] Community acquired pneumonia, unspecified laterality [J18.9] Patient Active Problem List   Diagnosis Date Noted   Sepsis (HCC) 05/06/2023   Impaired functional mobility, balance, gait, and endurance 09/27/2018   Unilateral primary osteoarthritis,  left hip 02/10/2017   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   H/O total hip arthroplasty, left 02/10/2017   History of total hip arthroplasty 02/10/2017   Respiratory failure (HCC)    OSA (obstructive sleep apnea)    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   HTN (hypertension) 10/13/2016   Anemia 03/02/2016   BPH (benign prostatic hyperplasia) 03/02/2016   HLD (hyperlipidemia) 03/02/2016   Migraines 03/02/2016   OAB (overactive bladder) 03/02/2016   CAD (coronary artery disease) 02/09/2015   Morbid obesity with BMI of 40.0-44.9, adult (HCC) 01/29/2015   Bladder cancer (HCC) 01/29/2015   Pain in the chest    Chronic systolic heart failure (HCC)    Esophageal reflux    Chest pain 01/08/2015   Insulin dependent diabetes mellitus 01/08/2015   COPD (chronic obstructive pulmonary disease) (HCC) 01/08/2015   Hyperlipidemia associated with type 2 diabetes mellitus (HCC) 01/08/2015   PCP:  [name], MD Pharmacy:   [facility] [place], [place] - [address] [place] [address] Phone: [phone] Fax: [phone]     Social Determinants of Health (SDOH) Social History: SDOH Screenings   Food Insecurity: No Food Insecurity (05/06/2023)  Housing: Low Risk  (05/06/2023)  Transportation Needs: No Transportation Needs (05/06/2023)  Utilities: Patient Declined (05/07/2023)  Depression (PHQ2-9): Low Risk  (11/08/2021)  Tobacco Use: Medium Risk (05/06/2023)   SDOH Interventions:     Readmission Risk Interventions     No data to display

## 2023-05-08 DIAGNOSIS — N400 Enlarged prostate without lower urinary tract symptoms: Secondary | ICD-10-CM

## 2023-05-08 DIAGNOSIS — I5022 Chronic systolic (congestive) heart failure: Secondary | ICD-10-CM | POA: Diagnosis not present

## 2023-05-08 DIAGNOSIS — I159 Secondary hypertension, unspecified: Secondary | ICD-10-CM | POA: Diagnosis not present

## 2023-05-08 DIAGNOSIS — R55 Syncope and collapse: Secondary | ICD-10-CM | POA: Diagnosis not present

## 2023-05-08 DIAGNOSIS — J438 Other emphysema: Secondary | ICD-10-CM | POA: Diagnosis not present

## 2023-05-08 DIAGNOSIS — N1831 Chronic kidney disease, stage 3a: Secondary | ICD-10-CM

## 2023-05-08 DIAGNOSIS — E669 Obesity, unspecified: Secondary | ICD-10-CM

## 2023-05-08 LAB — CBC WITH DIFFERENTIAL/PLATELET
Abs Immature Granulocytes: 0.01 10*3/uL (ref 0.00–0.07)
Basophils Absolute: 0 10*3/uL (ref 0.0–0.1)
Basophils Relative: 1 %
Eosinophils Absolute: 0.2 10*3/uL (ref 0.0–0.5)
Eosinophils Relative: 3 %
HCT: 40.6 % (ref 39.0–52.0)
Hemoglobin: 12 g/dL — ABNORMAL LOW (ref 13.0–17.0)
Immature Granulocytes: 0 %
Lymphocytes Relative: 22 %
Lymphs Abs: 1 10*3/uL (ref 0.7–4.0)
MCH: 29.3 pg (ref 26.0–34.0)
MCHC: 29.6 g/dL — ABNORMAL LOW (ref 30.0–36.0)
MCV: 99 fL (ref 80.0–100.0)
Monocytes Absolute: 0.4 10*3/uL (ref 0.1–1.0)
Monocytes Relative: 10 %
Neutro Abs: 2.9 10*3/uL (ref 1.7–7.7)
Neutrophils Relative %: 64 %
Platelets: 138 10*3/uL — ABNORMAL LOW (ref 150–400)
RBC: 4.1 MIL/uL — ABNORMAL LOW (ref 4.22–5.81)
RDW: 13.9 % (ref 11.5–15.5)
WBC: 4.5 10*3/uL (ref 4.0–10.5)
nRBC: 0 % (ref 0.0–0.2)

## 2023-05-08 LAB — BASIC METABOLIC PANEL
Anion gap: 7 (ref 5–15)
BUN: 17 mg/dL (ref 8–23)
CO2: 29 mmol/L (ref 22–32)
Calcium: 8.1 mg/dL — ABNORMAL LOW (ref 8.9–10.3)
Chloride: 104 mmol/L (ref 98–111)
Creatinine, Ser: 1.11 mg/dL (ref 0.61–1.24)
GFR, Estimated: >60 mL/min (ref >60)
Glucose, Bld: 84 mg/dL (ref 70–99)
Potassium: 4.2 mmol/L (ref 3.5–5.1)
Sodium: 140 mmol/L (ref 135–145)

## 2023-05-08 LAB — GLUCOSE, CAPILLARY
Glucose-Capillary: 94 mg/dL (ref 70–99)
Glucose-Capillary: 153 mg/dL — ABNORMAL HIGH (ref 70–99)
Glucose-Capillary: 195 mg/dL — ABNORMAL HIGH (ref 70–99)
Glucose-Capillary: 185 mg/dL — ABNORMAL HIGH (ref 70–99)

## 2023-05-08 NOTE — Plan of Care (Signed)
  Problem: Education: Goal: Knowledge of General Education information will improve Description Including pain rating scale, medication(s)/side effects and non-pharmacologic comfort measures Outcome: Progressing   

## 2023-05-08 NOTE — TOC Initial Note (Signed)
Transition of Care (TOC) - Initial/Assessment Note    Patient Details  Name: Ricky Johnston. MRN: [id number] Date of Birth: [birth year]  [facility] CM/SW Contact:    Annice Needy, LCSW Phone Number: 05/08/2023, 11:23 AM  Clinical Narrative:                 Patient from home with spouse. Uses walker at baseline. Has wc, scooter, home oxygen provided by [facility] through [place], shower bench in shower, shower chair. PT recommends SNF. Discussed with patient and spouse. They choose HH. [facility] is accepting of his insurance and accepts referral.   Expected Discharge Plan: Home/Self Care Barriers to Discharge: Continued Medical Work up   Patient Goals and CMS Choice Patient states their goals for this hospitalization and ongoing recovery are:: home with [facility]     [place] ownership interest in [facility].provided to:: Spouse    Expected Discharge Plan and Services     Post Acute Care Choice: Home Health Living arrangements for the past 2 months: Single Family Home                           HH Arranged: PT, OT HH Agency: [facility] Date [facility] Agency Contacted: 05/08/23 Time HH Agency Contacted: 1123 Representative spoke with at [facility] Agency: Kandee Keen  Prior Living Arrangements/Services Living arrangements for the past 2 months: Single Family Home Lives with:: Spouse Patient language and need for interpreter reviewed:: Yes Do you feel safe going back to the place where you live?: Yes      Need for Family Participation in Patient Care: Yes (Comment) Care giver support system in place?: Yes (comment) Current home services: DME (oxygen, scooter, shower bench, shower chair, walker, wheelchair.) Criminal Activity/Legal Involvement Pertinent to Current Situation/Hospitalization: No - Comment as needed  Activities of Daily Living Home Assistive Devices/Equipment: Environmental consultant (specify type), Shower chair with back ADL Screening (condition at time of  admission) Patient's cognitive ability adequate to safely complete daily activities?: Yes Is the patient deaf or have difficulty hearing?: No Does the patient have difficulty seeing, even when wearing glasses/contacts?: No Does the patient have difficulty concentrating, remembering, or making decisions?: No Patient able to express need for assistance with ADLs?: Yes Does the patient have difficulty dressing or bathing?: No Independently performs ADLs?: Yes (appropriate for developmental age) Walks in Home: Independent with device (comment) Does the patient have difficulty walking or climbing stairs?: No (Patient stated he does not have any stairs.) Weakness of Legs: Both Weakness of Arms/Hands: None  Permission Sought/Granted Permission sought to share information with : Family Supports    Share Information with NAME: Keandre Linden, wife           Emotional Assessment     Affect (typically observed): Appropriate Orientation: : Oriented to Self, Oriented to Place, Oriented to  Time, Oriented to Situation Alcohol / Substance Use: Not Applicable Psych Involvement: No (comment)  Admission diagnosis:  Syncope and collapse [R55] Sepsis (HCC) [A41.9] Community acquired pneumonia, unspecified laterality [J18.9] Patient Active Problem List   Diagnosis Date Noted   Class 2 obesity 05/07/2023   CKD stage 3a, GFR 45-59 ml/min (HCC) 05/07/2023   Syncope 05/06/2023   Impaired functional mobility, balance, gait, and endurance 09/27/2018   Unilateral primary osteoarthritis, left hip 02/10/2017   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   H/O total hip arthroplasty, left 02/10/2017   History of total hip arthroplasty 02/10/2017  Respiratory failure (HCC)    OSA (obstructive sleep apnea)    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   HTN (hypertension) 10/13/2016   Anemia 03/02/2016   BPH (benign prostatic hyperplasia) 03/02/2016   Type 2 diabetes mellitus with hyperlipidemia (HCC)  03/02/2016   Migraines 03/02/2016   OAB (overactive bladder) 03/02/2016   CAD (coronary artery disease) 02/09/2015   Morbid obesity with BMI of 40.0-44.9, adult (HCC) 01/29/2015   Bladder cancer (HCC) 01/29/2015   Pain in the chest    Chronic systolic heart failure (HCC)    Esophageal reflux    Chest pain 01/08/2015   Insulin dependent diabetes mellitus 01/08/2015   COPD (chronic obstructive pulmonary disease) (HCC) 01/08/2015   Hyperlipidemia associated with type 2 diabetes mellitus (HCC) 01/08/2015   PCP:  [name], MD Pharmacy:   [facility] [place], [place] - [address] [place] [address] Phone: [phone] Fax: [phone]     Social Determinants of Health (SDOH) Social History: SDOH Screenings   Food Insecurity: No Food Insecurity (05/06/2023)  Housing: Low Risk  (05/06/2023)  Transportation Needs: No Transportation Needs (05/06/2023)  Utilities: Patient Declined (05/07/2023)  Depression (PHQ2-9): Low Risk  (11/08/2021)  Tobacco Use: Medium Risk (05/06/2023)   SDOH Interventions:     Readmission Risk Interventions     No data to display

## 2023-05-08 NOTE — Progress Notes (Signed)
Awake, alert and oriented X4 with no complaints at this time. Medication given, no request from patient at this time.

## 2023-05-08 NOTE — Progress Notes (Addendum)
PROGRESS NOTE    Ricky Johnston  ZOX:[id number] DOB: [birth year] DOA: 05/06/2023 PCP: [name], MD   Brief Narrative:  [name] was admitted to the hospital with the working diagnosis syncope.    72 yo male with the past medical history of active bladder cancer sp resection, COPD, T2DM, heart failure, and upper extremity deep vein thrombosis who presented with a syncope episode. Patient was outside his house, and when he was trying to stand up, felt dizzy, lightheaded and loss his consciousness for about 30 minutes. He had been working his wife in his garage, and outside temperature was particularly high. He uses a walker for ambulation and is on continuous supplemental 02 per [place].   Assessment & Plan:   Principal Problem:   Syncope Active Problems:   COPD (chronic obstructive pulmonary disease) (HCC)   Chronic systolic heart failure (HCC)   HTN (hypertension)   CAD (coronary artery disease)   CKD stage 3a, GFR 45-59 ml/min (HCC)   Type 2 diabetes mellitus with hyperlipidemia (HCC)   BPH (benign prostatic hyperplasia)   Class 2 obesity   Syncope Concurrent ambulatory dysfunction Orthostatic/ vasovagal presyncope symptoms ongoing, patient having difficulty ambulating today even with assistance Infection ruled out given negative imaging no white count or fever or other symptoms  At this point in time sepsis has been ruled out.  PT OT continues to follow Afternoon update - patient continues to be moderately symptomatic with ambulation/therapy today. Patient being evaluated for SNF placement given ongoing ambulatory dysfunction/symptoms/level of assistance needed for safe ambulation.   COPD (chronic obstructive pulmonary disease) (HCC) Without acute exacerbation. Continue supplemental 02 per [place] and bronchodilator therapy per home regimen.    Chronic systolic heart failure (HCC) Without acute exacerbation, hold on diuretic therapy for now given above.   HTN  (hypertension) Hold antihypertensive medications, including diuretics given above.    CAD (coronary artery disease) Without symptoms, holding antihypertensives as above   CKD stage 3a, GFR 45-59 ml/min (HCC) Creatinine appears to be at baseline, hold further IV fluids or diuretics, follow orthostatics as above prior to reinitiation.    Uncontrolled type 2 diabetes mellitus with hyperlipidemia (HCC) A1c 8.6 Continue sliding scale insulin, diabetic diet, hypoglycemic protocol   Hyperlipidemia Continue with statin therapy.    BPH (benign prostatic hyperplasia) No signs of urinary retention.    Class 2 obesity Calculated BMI is 39,8   DVT prophylaxis: Heparin Code Status: Full Family Communication: None present  Status is: Observation  Dispo: The patient is from: Home              Anticipated d/c is to: To be determined              Anticipated d/c date is: 24 to 48 hours              Patient currently not medically stable for discharge  Consultants:  None  Procedures:  None   Antimicrobials:  None indicated  Subjective: No acute issues or events overnight, continues to have symptoms of lightheadedness and instability with ambulation, denies chest pain nausea vomiting diarrhea constipation headache fevers chills shortness of breath.  Objective: Vitals:   05/07/23 1315 05/07/23 2100 05/08/23 0620 05/08/23 0736  BP: 129/66 (!) 125/54 124/68   Pulse: 76 69 78 84  Resp:  20 20 18   Temp: 98 F (36.7 C) 98.4 F (36.9 C) 98.3 F (36.8 C)   TempSrc: Oral Oral Oral   SpO2: 94% 97%  93% 90%  Weight:      Height:        Intake/Output Summary (Last 24 hours) at 05/08/2023 1022 Last data filed at 05/08/2023 9147 Gross per 24 hour  Intake 1000 ml  Output 2850 ml  Net -1850 ml   Filed Weights   05/06/23 1847  Weight: 118.8 kg    Examination:  General:  Pleasantly resting in bed, No acute distress. HEENT:  Normocephalic atraumatic.  Sclerae nonicteric,  noninjected.  Extraocular movements intact bilaterally. Neck:  Without mass or deformity.  Trachea is midline. Lungs:  Clear to auscultate bilaterally without rhonchi, wheeze, or rales. Heart:  Regular rate and rhythm.  Without murmurs, rubs, or gallops. Abdomen:  Soft, nontender, nondistended.  Without guarding or rebound. Extremities: Without cyanosis, clubbing, edema, or obvious deformity. Skin:  Warm and dry, no erythema   Data Reviewed: I have personally reviewed following labs and imaging studies  CBC: Recent Labs  Lab 05/06/23 1845 05/07/23 0322 05/08/23 0418  WBC 4.5 4.8 4.5  NEUTROABS 3.1  --  2.9  HGB 13.0 12.5* 12.0*  HCT 41.9 42.1 40.6  MCV 95.9 99.5 99.0  PLT 154 139* 138*   Basic Metabolic Panel: Recent Labs  Lab 05/06/23 1845 05/07/23 0322 05/08/23 0418  NA 135 142 140  K 4.0 4.2 4.2  CL 97* 104 104  CO2 31 30 29   GLUCOSE 346* 237* 84  BUN 26* 22 17  CREATININE 1.60* 1.34* 1.11  CALCIUM 8.0* 7.7* 8.1*   GFR: Estimated Creatinine Clearance: 75.4 mL/min (by C-G formula based on SCr of 1.11 mg/dL). Liver Function Tests: Recent Labs  Lab 05/06/23 1845  AST 15  ALT 13  ALKPHOS 76  BILITOT 0.7  PROT 6.6  ALBUMIN 3.1*   Coagulation Profile: Recent Labs  Lab 05/06/23 1943  INR 1.1   HbA1C: Recent Labs    05/06/23 1849  HGBA1C 8.6*   CBG: Recent Labs  Lab 05/07/23 0550 05/07/23 1158 05/07/23 1657 05/07/23 2122 05/08/23 0707  GLUCAP 200* 194* 219* 156* 94   Sepsis Labs: Recent Labs  Lab 05/06/23 1943 05/06/23 2138  LATICACIDVEN 2.4* 1.6    Recent Results (from the past 240 hour(s))  Resp panel by RT-PCR (RSV, Flu A&B, Covid) Anterior Nasal Swab     Status: None   Collection Time: 05/06/23  7:47 PM   Specimen: Anterior Nasal Swab  Result Value Ref Range Status   SARS Coronavirus 2 by RT PCR NEGATIVE NEGATIVE Final    Comment: (NOTE) SARS-CoV-2 target nucleic acids are NOT DETECTED.  The SARS-CoV-2 RNA is generally  detectable in upper respiratory specimens during the acute phase of infection. The lowest concentration of SARS-CoV-2 viral copies this assay can detect is 138 copies/mL. A negative result does not preclude SARS-Cov-2 infection and should not be used as the sole basis for treatment or other patient management decisions. A negative result may occur with  improper specimen collection/handling, submission of specimen other than nasopharyngeal swab, presence of viral mutation(s) within the areas targeted by this assay, and inadequate number of viral copies(<138 copies/mL). A negative result must be combined with clinical observations, patient history, and epidemiological information. The expected result is Negative.  Fact Sheet for Patients:  BloggerCourse.com  Fact Sheet for Healthcare Providers:  SeriousBroker.it  This test is no t yet approved or cleared by the [place] FDA and  has been authorized for detection and/or diagnosis of SARS-CoV-2 by FDA under an Emergency Use Authorization (EUA). This EUA will  remain  in effect (meaning this test can be used) for the duration of the COVID-19 declaration under Section 564(b)(1) of the Act, 21 U.S.C.section 360bbb-3(b)(1), unless the authorization is terminated  or revoked sooner.       Influenza A by PCR NEGATIVE NEGATIVE Final   Influenza B by PCR NEGATIVE NEGATIVE Final    Comment: (NOTE) The Xpert Xpress SARS-CoV-2/FLU/RSV plus assay is intended as an aid in the diagnosis of influenza from Nasopharyngeal swab specimens and should not be used as a sole basis for treatment. Nasal washings and aspirates are unacceptable for Xpert Xpress SARS-CoV-2/FLU/RSV testing.  Fact Sheet for Patients: BloggerCourse.com  Fact Sheet for Healthcare Providers: SeriousBroker.it  This test is not yet approved or cleared by the [place] FDA  and has been authorized for detection and/or diagnosis of SARS-CoV-2 by FDA under an Emergency Use Authorization (EUA). This EUA will remain in effect (meaning this test can be used) for the duration of the COVID-19 declaration under Section 564(b)(1) of the Act, 21 U.S.C. section 360bbb-3(b)(1), unless the authorization is terminated or revoked.     Resp Syncytial Virus by PCR NEGATIVE NEGATIVE Final    Comment: (NOTE) Fact Sheet for Patients: BloggerCourse.com  Fact Sheet for Healthcare Providers: SeriousBroker.it  This test is not yet approved or cleared by the [place] FDA and has been authorized for detection and/or diagnosis of SARS-CoV-2 by FDA under an Emergency Use Authorization (EUA). This EUA will remain in effect (meaning this test can be used) for the duration of the COVID-19 declaration under Section 564(b)(1) of the Act, 21 U.S.C. section 360bbb-3(b)(1), unless the authorization is terminated or revoked.  Performed at [facility], [address], [place], [place] [address]          Radiology Studies: DG Chest 1 View  Result Date: 05/07/2023 CLINICAL DATA:  Syncope EXAM: CHEST  1 VIEW COMPARISON:  X-ray 05/06/2023 FINDINGS: Stable enlarged cardiopericardial silhouette with vascular congestion. No consolidation, pneumothorax or effusion. No edema. Overlapping cardiac leads. Previous basilar opacity on the right is improved. IMPRESSION: Improving basilar opacity. Enlarged heart with vascular congestion. Electronically Signed   By: Karen Kays M.D.   On: 05/07/2023 17:06   DG Toe Great Left  Result Date: 05/06/2023 CLINICAL DATA:  Status post trauma 2 days ago. EXAM: LEFT GREAT TOE COMPARISON:  None Available. FINDINGS: There is no evidence of fracture or dislocation. There is no evidence of arthropathy or other focal bone abnormality. Soft tissues are unremarkable. IMPRESSION: Negative. Electronically Signed    By: Aram Candela M.D.   On: 05/06/2023 19:36   DG Chest Port 1 View  Result Date: 05/06/2023 CLINICAL DATA:  Syncope and elevated temperature. EXAM: PORTABLE CHEST 1 VIEW COMPARISON:  February 10, 2017 FINDINGS: The cardiac silhouette is mildly enlarged and unchanged in size. Mild atelectasis and/or infiltrate is seen within the right lung base. There is no evidence of a pleural effusion or pneumothorax. The visualized skeletal structures are unremarkable. IMPRESSION: Mild right basilar atelectasis and/or infiltrate. Electronically Signed   By: Aram Candela M.D.   On: 05/06/2023 19:35        Scheduled Meds:  heparin  5,000 Units Subcutaneous Q8H   insulin aspart  0-15 Units Subcutaneous TID WC   insulin regular human CONCENTRATED  60 Units Subcutaneous Q breakfast   And   insulin regular human CONCENTRATED  60 Units Subcutaneous Q lunch   And   insulin regular human CONCENTRATED  30 Units Subcutaneous Q supper  polyethylene glycol  17 g Oral Daily   rOPINIRole  0.5 mg Oral QHS   rOPINIRole  1 mg Oral QHS   tamsulosin  0.4 mg Oral Daily   umeclidinium bromide  1 puff Inhalation Daily   Continuous Infusions:   LOS: 0 days   Time spent:  Azucena Fallen, DO [facility]  If 7PM-7AM, please contact night-coverage [email or web address]  05/08/2023, 10:22 AM

## 2023-05-08 NOTE — Progress Notes (Signed)
Patient rested through the night, no acute overnight events. 

## 2023-05-08 NOTE — Evaluation (Signed)
Physical Therapy Evaluation Patient Details Name: Ricky GOH Sr. MRN: [id number] DOB: [birth year] Today's Date: 05/08/2023  History of Present Illness  Ricky Johnston  is a 72 y.o. male, with history of upper extremity DVT, active bladder cancer (S/CP resection ), COPD, and insulin-dependent diabetes mellitus, currently [place] CHF, chronic respiratory failure on 2 L oxygen.  -Patient presents to ED secondary to episode of loss of consciousness, patient was outside the house today, when he was trying to stand up, he felt dizzy, lightheaded, where he lost consciousness for around 30 minutes, denies chest pain, reports chronic dyspnea, reports cough for last couple weeks, notices,   Clinical Impression  Patient demonstrates fair/good return for sitting up at bedside having to use bed rail, slow labored movement during sit to stands, taking sides with most difficulty advancing LLE due to great toe pain and BLE weakness.  Patient tolerated sitting up in chair after therapy.  Patient will benefit from continued skilled physical therapy in hospital and recommended venue below to increase strength, balance, endurance for safe ADLs and gait.         Recommendations for follow up therapy are one component of a multi-disciplinary discharge planning process, led by the attending physician.  Recommendations may be updated based on patient status, additional functional criteria and insurance authorization.  Follow Up Recommendations Can patient physically be transported by private vehicle: No     Assistance Recommended at Discharge Set up Supervision/Assistance  Patient can return home with the following  A lot of help with bathing/dressing/bathroom;A lot of help with walking and/or transfers;Help with stairs or ramp for entrance;Assistance with cooking/housework    Equipment Recommendations None recommended by PT  Recommendations for Other Services       Functional Status Assessment Patient has had a  recent decline in their functional status and demonstrates the ability to make significant improvements in function in a reasonable and predictable amount of time.     Precautions / Restrictions Precautions Precautions: Fall Restrictions Weight Bearing Restrictions: No      Mobility  Bed Mobility Overal bed mobility: Needs Assistance Bed Mobility: Supine to Sit, Sit to Supine     Supine to sit: Supervision, HOB elevated Sit to supine: Supervision, HOB elevated   General bed mobility comments: labored movement with HOB partially raised, required use of bed rail    Transfers Overall transfer level: Needs assistance Equipment used: Rolling walker (2 wheels) Transfers: Sit to/from Stand, Bed to chair/wheelchair/BSC Sit to Stand: Mod assist   Step pivot transfers: Mod assist       General transfer comment: unsteady labored movement requiring verbal/tactile cueing for proper hand placement on RW during transfer to chair    Ambulation/Gait Ambulation/Gait assistance: Mod assist, Max assist Gait Distance (Feet): 5 Feet Assistive device: Rolling walker (2 wheels) Gait Pattern/deviations: Decreased step length - right, Decreased step length - left, Decreased stride length, Knees buckling Gait velocity: slow     General Gait Details: limited to a few slo wlabored unsteady side steps with difficulty advancing LLE due to great toe pain, poor standing balance  Stairs            Wheelchair Mobility    Modified Rankin (Stroke Patients Only)       Balance Overall balance assessment: Needs assistance Sitting-balance support: Feet supported, No upper extremity supported Sitting balance-Leahy Scale: Good Sitting balance - Comments: seated at EOB   Standing balance support: During functional activity, Bilateral upper extremity supported, Reliant on assistive device for  balance Standing balance-Leahy Scale: Poor Standing balance comment: using RW                              Pertinent Vitals/Pain Pain Assessment Pain Assessment: Faces Faces Pain Scale: Hurts little more Pain Location: L foot (great toe) Pain Descriptors / Indicators: Guarding, Discomfort, Sore Pain Intervention(s): Limited activity within patient's tolerance, Monitored during session, Repositioned    Home Living Family/patient expects to be discharged to:: Private residence Living Arrangements: Spouse/significant other Available Help at Discharge: Family;Available 24 hours/day Type of Home: House Home Access: Ramped entrance       Home Layout: One level Home Equipment: Rollator (4 wheels);Cane - single point;Shower seat;Grab bars - toilet;Grab bars - tub/shower;Other (comment) Additional Comments: Reports wife is not physically able to lift him.    Prior Function Prior Level of Function : Needs assist;History of Falls (last six months)       Physical Assist : ADLs (physical);Mobility (physical) Mobility (physical): Bed mobility;Transfers;Gait;Stairs ADLs (physical): IADLs Mobility Comments: [place] ambulator with rollator; does not drive. ADLs Comments: Independent ADL; assist IADL     Hand Dominance   Dominant Hand:  (ambidextrous)    Extremity/Trunk Assessment   Upper Extremity Assessment Upper Extremity Assessment: Defer to OT evaluation    Lower Extremity Assessment Lower Extremity Assessment: Generalized weakness    Cervical / Trunk Assessment Cervical / Trunk Assessment: Kyphotic  Communication   Communication: No difficulties  Cognition Arousal/Alertness: Awake/alert Behavior During Therapy: WFL for tasks assessed/performed Overall Cognitive Status: Within Functional Limits for tasks assessed                                          General Comments      Exercises     Assessment/Plan    PT Assessment Patient needs continued PT services  PT Problem List Decreased strength;Decreased activity tolerance;Decreased  balance;Decreased mobility       PT Treatment Interventions DME instruction;Gait training;Stair training;Functional mobility training;Therapeutic activities;Therapeutic exercise;Balance training;Patient/family education    PT Goals (Current goals can be found in the Care Plan section)  Acute Rehab PT Goals Patient Stated Goal: return home PT Goal Formulation: With patient Time For Goal Achievement: 05/22/23 Potential to Achieve Goals: Good    Frequency Min 3X/week     Co-evaluation PT/OT/SLP Co-Evaluation/Treatment: Yes Reason for Co-Treatment: To address functional/ADL transfers PT goals addressed during session: Mobility/safety with mobility;Balance;Proper use of DME OT goals addressed during session: ADL's and self-care       AM-PAC PT "6 Clicks" Mobility  Outcome Measure Help needed turning from your back to your side while in a flat bed without using bedrails?: A Little Help needed moving from lying on your back to sitting on the side of a flat bed without using bedrails?: A Little Help needed moving to and from a bed to a chair (including a wheelchair)?: A Lot Help needed standing up from a chair using your arms (e.g., wheelchair or bedside chair)?: A Lot Help needed to walk in hospital room?: A Lot Help needed climbing 3-5 steps with a railing? : A Lot 6 Click Score: 14    End of Session   Activity Tolerance: Patient tolerated treatment well;Patient limited by fatigue Patient left: in chair;with call bell/phone within reach Nurse Communication: Mobility status PT Visit Diagnosis: Unsteadiness on feet (R26.81);Other abnormalities of gait  and mobility (R26.89);Muscle weakness (generalized) (M62.81)    Time: 4098-1191 PT Time Calculation (min) (ACUTE ONLY): 28 min   Charges:   PT Evaluation $PT Eval Moderate Complexity: 1 Mod PT Treatments $Therapeutic Activity: 23-37 mins        12:31 PM, 05/08/23 Ocie Bob, MPT Physical Therapist with  [facility] 336 [phone] office [phone] mobile phone

## 2023-05-08 NOTE — Plan of Care (Signed)
  Problem: Acute Rehab PT Goals(only PT should resolve) Goal: Pt Will Go Supine/Side To Sit Outcome: Progressing Flowsheets (Taken 05/08/2023 1232) Pt will go Supine/Side to Sit: with modified independence Goal: Patient Will Transfer Sit To/From Stand Outcome: Progressing Flowsheets (Taken 05/08/2023 1232) Patient will transfer sit to/from stand:  with minimal assist  with moderate assist Goal: Pt Will Transfer Bed To Chair/Chair To Bed Outcome: Progressing Flowsheets (Taken 05/08/2023 1232) Pt will Transfer Bed to Chair/Chair to Bed:  with min assist  with mod assist Goal: Pt Will Ambulate Outcome: Progressing Flowsheets (Taken 05/08/2023 1232) Pt will Ambulate:  15 feet  with moderate assist  with rolling walker   12:33 PM, 05/08/23 Ocie Bob, MPT Physical Therapist with [facility] 336 [phone] office [phone] mobile phone

## 2023-05-08 NOTE — Evaluation (Signed)
Occupational Therapy Evaluation Patient Details Name: Ricky MCBRATNEY Sr. MRN: [id number] DOB: 05/31/1951 Today's Date: 05/08/2023   History of Present Illness Ricky Johnston  is a 72 y.o. male, with history of upper extremity DVT, active bladder cancer (S/CP resection ), COPD, and insulin-dependent diabetes mellitus, currently Juliane CHF, chronic respiratory failure on 2 L oxygen.  -Patient presents to ED secondary to episode of loss of consciousness, patient was outside the house today, when he was trying to stand up, he felt dizzy, lightheaded, where he lost consciousness for around 30 minutes, denies chest pain, reports chronic dyspnea, reports cough for last couple weeks, notices, (Per MD)   Clinical Impression   Pt agreeable to OT and PT co-evaluation. Pt reports independent ADL's at baseline with wife assisting for IADL's. Reports wife is not physically able to lift him at home. Reports having multiple falls in the past 6 months. Today pt required mod A for transfers with very slow labored movement. Pt uses sock aid and reacher for dressing at baseline but any standing dressing or other ADL tasks would require assist at this time. Pt is a high fall risk in his current condition with reports of L great toe pain causing much of his limitation. Pt left in the chair with call bell within reach. Pt will benefit from continued OT in the hospital and recommended venue below to increase strength, balance, and endurance for safe ADL's.         Recommendations for follow up therapy are one component of a multi-disciplinary discharge planning process, led by the attending physician.  Recommendations may be updated based on patient status, additional functional criteria and insurance authorization.   Assistance Recommended at Discharge Intermittent Supervision/Assistance  Patient can return home with the following A lot of help with walking and/or transfers;A little help with  bathing/dressing/bathroom;Assistance with cooking/housework;Assist for transportation;Help with stairs or ramp for entrance    Functional Status Assessment  Patient has had a recent decline in their functional status and demonstrates the ability to make significant improvements in function in a reasonable and predictable amount of time.  Equipment Recommendations  BSC/3in1 (BSC if pt decideds to go home and confirms that he does not have one. Not needed if pt goes to SNF as recommended.)           Precautions / Restrictions Precautions Precautions: Fall Restrictions Weight Bearing Restrictions: No      Mobility Bed Mobility Overal bed mobility: Needs Assistance Bed Mobility: Supine to Sit, Sit to Supine     Supine to sit: Supervision, HOB elevated Sit to supine: Supervision, HOB elevated   General bed mobility comments: Slow labored movement; use of bed rail.    Transfers Overall transfer level: Needs assistance Equipment used: Rolling walker (2 wheels) Transfers: Sit to/from Stand, Bed to chair/wheelchair/BSC Sit to Stand: Mod assist     Step pivot transfers: Mod assist     General transfer comment: Slow labored movement. Assist to boost from EOB.      Balance Overall balance assessment: Needs assistance Sitting-balance support: No upper extremity supported, Feet supported Sitting balance-Leahy Scale: Good Sitting balance - Comments: seated at EOB   Standing balance support: During functional activity, Bilateral upper extremity supported, Reliant on assistive device for balance Standing balance-Leahy Scale: Poor Standing balance comment: using RW                           ADL either performed or assessed with  clinical judgement   ADL Overall ADL's : Needs assistance/impaired     Grooming: Set up;Sitting   Upper Body Bathing: Set up;Sitting   Lower Body Bathing: Minimal assistance;Sitting/lateral leans   Upper Body Dressing : Set  up;Sitting   Lower Body Dressing: Minimal assistance;Sitting/lateral leans Lower Body Dressing Details (indicate cue type and reason): Pt uses sock aid and reacher at baseline. Toilet Transfer: Moderate assistance;Stand-pivot;Rolling walker (2 wheels) Toilet Transfer Details (indicate cue type and reason): Simulated via EOB to chair transfer. Toileting- Clothing Manipulation and Hygiene: Minimal assistance;Moderate assistance;Sitting/lateral lean       Functional mobility during ADLs: Moderate assistance;Maximal assistance;Rolling walker (2 wheels)       Vision Baseline Vision/History: 1 Wears glasses Ability to See in Adequate Light: 1 Impaired Patient Visual Report: Other (comment);Diplopia (Pt reports history of intermittent diplopia. Vision is normal at this time.) Vision Assessment?: No apparent visual deficits                Pertinent Vitals/Pain Pain Assessment Pain Assessment: Faces Faces Pain Scale: Hurts little more Pain Location: L foot (great toe) Pain Descriptors / Indicators: Guarding Pain Intervention(s): Limited activity within patient's tolerance, Monitored during session, Repositioned     Hand Dominance  (ambidextrous)   Extremity/Trunk Assessment Upper Extremity Assessment Upper Extremity Assessment: Overall WFL for tasks assessed   Lower Extremity Assessment Lower Extremity Assessment: Defer to PT evaluation   Cervical / Trunk Assessment Cervical / Trunk Assessment: Kyphotic   Communication Communication Communication: No difficulties   Cognition Arousal/Alertness: Awake/alert Behavior During Therapy: WFL for tasks assessed/performed Overall Cognitive Status: Within Functional Limits for tasks assessed                                                        Home Living Family/patient expects to be discharged to:: Private residence Living Arrangements: Spouse/significant other Available Help at Discharge:  Family;Available 24 hours/day Type of Home: House Home Access: Ramped entrance     Home Layout: One level     Bathroom Shower/Tub: Producer, television/film/video: Standard Bathroom Accessibility: Yes How Accessible: Accessible via walker Home Equipment: Rollator (4 wheels);Cane - single point;Shower seat;Grab bars - toilet;Grab bars - tub/shower;Other (comment) (Pt reported having some sort of lift that would help him off the floor.)   Additional Comments: Reports wife is not physically able to lift him.      Prior Functioning/Environment Prior Level of Function : Needs assist;History of Falls (last six months)       Physical Assist : ADLs (physical)   ADLs (physical): IADLs Mobility Comments: [place] ambulator with rollator; does not drive. ADLs Comments: Independent ADL; assist IADL        OT Problem List: Decreased strength;Decreased activity tolerance;Impaired balance (sitting and/or standing);Cardiopulmonary status limiting activity      OT Treatment/Interventions: Self-care/ADL training;Therapeutic exercise;Therapeutic activities;DME and/or AE instruction;Patient/family education    OT Goals(Current goals can be found in the care plan section) Acute Rehab OT Goals Patient Stated Goal: return home OT Goal Formulation: With patient Time For Goal Achievement: 05/22/23 Potential to Achieve Goals: Good  OT Frequency: Min 2X/week    Co-evaluation PT/OT/SLP Co-Evaluation/Treatment: Yes Reason for Co-Treatment: To address functional/ADL transfers   OT goals addressed during session: ADL's and self-care      AM-PAC OT "6 Clicks" Daily Activity  Outcome Measure Help from another person eating meals?: None Help from another person taking care of personal grooming?: None Help from another person toileting, which includes using toliet, bedpan, or urinal?: A Lot Help from another person bathing (including washing, rinsing, drying)?: A Little Help from another  person to put on and taking off regular upper body clothing?: A Little Help from another person to put on and taking off regular lower body clothing?: A Little 6 Click Score: 19   End of Session Equipment Utilized During Treatment: Rolling walker (2 wheels);Oxygen  Activity Tolerance: Patient tolerated treatment well Patient left: in chair;with call bell/phone within reach  OT Visit Diagnosis: Unsteadiness on feet (R26.81);Other abnormalities of gait and mobility (R26.89);Repeated falls (R29.6);Muscle weakness (generalized) (M62.81);Dizziness and giddiness (R42)                Time: 1610-9604 OT Time Calculation (min): 25 min Charges:  OT General Charges $OT Visit: 1 Visit OT Evaluation $OT Eval Low Complexity: 1 Low  Malie Kashani OT, MOT  Danie Chandler 05/08/2023, 9:18 AM

## 2023-05-08 NOTE — Plan of Care (Signed)
  Problem: Acute Rehab OT Goals (only OT should resolve) Goal: Pt. Will Perform Grooming Flowsheets (Taken 05/08/2023 0920) Pt Will Perform Grooming:  with modified independence  standing Goal: Pt. Will Perform Upper Body Dressing Flowsheets (Taken 05/08/2023 0920) Pt Will Perform Upper Body Dressing:  with modified independence  sitting Goal: Pt. Will Perform Lower Body Dressing Flowsheets (Taken 05/08/2023 0920) Pt Will Perform Lower Body Dressing:  with modified independence  sitting/lateral leans  with adaptive equipment Goal: Pt. Will Transfer To Toilet Flowsheets (Taken 05/08/2023 0920) Pt Will Transfer to Toilet:  with modified independence  ambulating Goal: Pt. Will Perform Toileting-Clothing Manipulation Flowsheets (Taken 05/08/2023 0920) Pt Will Perform Toileting - Clothing Manipulation and hygiene:  with modified independence  sitting/lateral leans  Bernadean Saling OT, MOT

## 2023-05-09 ENCOUNTER — Observation Stay (HOSPITAL_COMMUNITY): Payer: Medicare PPO

## 2023-05-09 DIAGNOSIS — I5022 Chronic systolic (congestive) heart failure: Secondary | ICD-10-CM | POA: Diagnosis not present

## 2023-05-09 DIAGNOSIS — J438 Other emphysema: Secondary | ICD-10-CM | POA: Diagnosis not present

## 2023-05-09 DIAGNOSIS — R55 Syncope and collapse: Secondary | ICD-10-CM | POA: Diagnosis not present

## 2023-05-09 DIAGNOSIS — I159 Secondary hypertension, unspecified: Secondary | ICD-10-CM | POA: Diagnosis not present

## 2023-05-09 LAB — CULTURE, BLOOD (ROUTINE X 2)
Culture: NO GROWTH
Culture: NO GROWTH
Special Requests: ADEQUATE

## 2023-05-09 LAB — BLOOD GAS, ARTERIAL
Acid-Base Excess: 7.7 mmol/L — ABNORMAL HIGH (ref 0.0–2.0)
Bicarbonate: 34.9 mmol/L — ABNORMAL HIGH (ref 20.0–28.0)
Drawn by: 33176
FIO2: 36 %
O2 Saturation: 98.4 %
Patient temperature: 37.1
pCO2 arterial: 59 mmHg — ABNORMAL HIGH (ref 32–48)
pH, Arterial: 7.38 (ref 7.35–7.45)
pO2, Arterial: 93 mmHg (ref 83–108)

## 2023-05-09 LAB — LEGIONELLA PNEUMOPHILA SEROGP 1 UR AG: L. pneumophila Serogp 1 Ur Ag: NEGATIVE

## 2023-05-09 LAB — GLUCOSE, CAPILLARY
Glucose-Capillary: 155 mg/dL — ABNORMAL HIGH (ref 70–99)
Glucose-Capillary: 187 mg/dL — ABNORMAL HIGH (ref 70–99)
Glucose-Capillary: 121 mg/dL — ABNORMAL HIGH (ref 70–99)
Glucose-Capillary: 131 mg/dL — ABNORMAL HIGH (ref 70–99)
Glucose-Capillary: 100 mg/dL — ABNORMAL HIGH (ref 70–99)

## 2023-05-09 MED ORDER — GABAPENTIN 300 MG PO CAPS
600.0000 mg | ORAL_CAPSULE | Freq: Three times a day (TID) | ORAL | Status: DC
  Administered 2023-05-09 – 2023-05-10 (×3): 600 mg via ORAL
  Filled 2023-05-09 (×3): qty 2

## 2023-05-09 MED ORDER — FLUTICASONE PROPIONATE 50 MCG/ACT NA SUSP: 1.0000 | Freq: Every day | NASAL | Status: DC | PRN

## 2023-05-09 MED ORDER — FUROSEMIDE 20 MG PO TABS
20.0000 mg | ORAL_TABLET | Freq: Every day | ORAL | Status: DC
  Administered 2023-05-09 – 2023-05-10 (×2): 20 mg via ORAL
  Filled 2023-05-09 (×2): qty 1

## 2023-05-09 NOTE — Progress Notes (Signed)
Laying in bed in supine position without complaints at this time. No events over night.

## 2023-05-09 NOTE — Progress Notes (Signed)
Mobility Specialist Progress Note:    05/09/23 1230  Mobility  Activity Refused mobility  Mobility Referral Yes   Pt received in bed, refused mobility despite max encouragement and education. Left pt in bed, will f/u tomorrow, all needs met.   Ricky Johnston Mobility Specialist Please contact via Special educational needs teacher or  Rehab office at [phone]

## 2023-05-09 NOTE — Progress Notes (Addendum)
PROGRESS NOTE    Ricky Johnston  ZDG:[id number] DOB: [birth year] DOA: 05/06/2023 PCP: Ricky Pink, MD   Brief Narrative:  Ricky Johnston was admitted to the hospital with the working diagnosis syncope.    72 yo male with the past medical history of active bladder cancer sp resection, COPD, T2DM, heart failure, and upper extremity deep vein thrombosis who presented with a syncope episode. Patient was outside his house, and when he was trying to stand up, felt dizzy, lightheaded and loss his consciousness for about 30 minutes. He had been working his wife in his garage, and outside temperature was particularly high. He uses a walker for ambulation and is on continuous supplemental 02 per [place].   Assessment & Plan:   Principal Problem:   Syncope Active Problems:   COPD (chronic obstructive pulmonary disease) (HCC)   Chronic systolic heart failure (HCC)   HTN (hypertension)   CAD (coronary artery disease)   CKD stage 3a, GFR 45-59 ml/min (HCC)   Type 2 diabetes mellitus with hyperlipidemia (HCC)   BPH (benign prostatic hyperplasia)   Class 2 obesity   Syncope Concurrent ambulatory dysfunction Orthostatic/vasovagal presyncope symptoms ongoing, patient continues having difficulty sitting up this morning, moderately symptomatic with position changes. Infection ruled out given negative imaging no white count or fever or other symptoms  At this point in time sepsis has been ruled out.  PT OT continues to follow - patient continues to be moderately symptomatic with ambulation/therapy today.  Plan for discharge to SNF once authorization and bed have been approved.   Respiratory distress/Pleuritic chest pain, left-sided  Diaphoresis -Acutely noted overnight into early morning - markedly tachypneic this morning with tachycardia and general malaise.  -Glucose within normal limits -ABG with minimally elevated CO2, compensated well, vitals otherwise stable, patient without  hypoxia -Follow up chest x-ray - *no overt findings. -Resume diuretics as below given possible volume overload (although not hypoxic) -Symptoms improving throughout the day but not resolved.  COPD (chronic obstructive pulmonary disease) (HCC) Without acute exacerbation. Continue supplemental 02 per [place] and bronchodilator therapy per home regimen.    Chronic systolic heart failure (HCC) Without acute exacerbation, resume diuretics, PO furosemide   HTN (hypertension) Hold antihypertensive medications, resume diuretics given above.    CAD (coronary artery disease) Without symptoms, holding antihypertensives as above   CKD stage 3a, GFR 45-59 ml/min (HCC) Creatinine appears to be at baseline, hold further IV fluids given respiratory status as above Resume diuretics.    Uncontrolled type 2 diabetes mellitus with hyperlipidemia (HCC) A1c 8.6 Continue sliding scale insulin, diabetic diet, hypoglycemic protocol   Hyperlipidemia Continue with statin therapy.    BPH (benign prostatic hyperplasia) No signs of urinary retention.    Class 2 obesity Calculated BMI is 39,8   DVT prophylaxis: Heparin Code Status: Full Family Communication: None present  Status is: Inpatient  Dispo: The patient is from: Home              Anticipated d/c is to: To be determined              Anticipated d/c date is: 24 to 48 hours              Patient currently not medically stable for discharge  Consultants:  None  Procedures:  None   Antimicrobials:  None indicated  Subjective: Somnolent this morning with pleuritic left lateral chest wall pain with deep inspiration resulting in shortness of breath.  Otherwise denies nausea vomiting diarrhea  constipation headache fevers chills.  Objective: Vitals:   05/09/23 0450 05/09/23 0740 05/09/23 0823 05/09/23 0900  BP: 127/65   (!) 108/53  Pulse: 73     Resp: 20     Temp: 98.8 F (37.1 C)     TempSrc: Oral     SpO2: 97% 97% 93% 97%  Weight:       Height:        Intake/Output Summary (Last 24 hours) at 05/09/2023 1123 Last data filed at 05/09/2023 0841 Gross per 24 hour  Intake 600 ml  Output 3300 ml  Net -2700 ml   Filed Weights   05/06/23 1847  Weight: 118.8 kg    Examination:  General: Somnolent but easily arousable, oriented x 4 nondistressed HEENT:  Normocephalic atraumatic.  Sclerae nonicteric, noninjected.  Extraocular movements intact bilaterally. Neck:  Without mass or deformity.  Trachea is midline. Lungs:  Clear to auscultate bilaterally without rhonchi, wheeze, or rales. Heart:  Regular rate and rhythm.  Without murmurs, rubs, or gallops. Abdomen:  Soft, nontender, nondistended.  Without guarding or rebound. Extremities: Without cyanosis, clubbing, edema, left great toe ecchymosis noted, tender to palpate right foot medially at the arch Skin:  Warm and dry, no erythema  Data Reviewed: I have personally reviewed following labs and imaging studies  CBC: Recent Labs  Lab 05/06/23 1845 05/07/23 0322 05/08/23 0418  WBC 4.5 4.8 4.5  NEUTROABS 3.1  --  2.9  HGB 13.0 12.5* 12.0*  HCT 41.9 42.1 40.6  MCV 95.9 99.5 99.0  PLT 154 139* 138*   Basic Metabolic Panel: Recent Labs  Lab 05/06/23 1845 05/07/23 0322 05/08/23 0418  NA 135 142 140  K 4.0 4.2 4.2  CL 97* 104 104  CO2 31 30 29   GLUCOSE 346* 237* 84  BUN 26* 22 17  CREATININE 1.60* 1.34* 1.11  CALCIUM 8.0* 7.7* 8.1*   GFR: Estimated Creatinine Clearance: 75.4 mL/min (by C-G formula based on SCr of 1.11 mg/dL). Liver Function Tests: Recent Labs  Lab 05/06/23 1845  AST 15  ALT 13  ALKPHOS 76  BILITOT 0.7  PROT 6.6  ALBUMIN 3.1*   Coagulation Profile: Recent Labs  Lab 05/06/23 1943  INR 1.1   HbA1C: Recent Labs    05/06/23 1849  HGBA1C 8.6*   CBG: Recent Labs  Lab 05/08/23 1628 05/08/23 2118 05/09/23 0713 05/09/23 0932 05/09/23 1114  GLUCAP 195* 185* 155* 187* 121*   Sepsis Labs: Recent Labs  Lab 05/06/23 1943  05/06/23 2138  LATICACIDVEN 2.4* 1.6    Recent Results (from the past 240 hour(s))  Blood Culture (routine x 2)     Status: None (Preliminary result)   Collection Time: 05/06/23  7:43 PM   Specimen: BLOOD  Result Value Ref Range Status   Specimen Description BLOOD BLOOD LEFT HAND  Final   Special Requests Blood Culture adequate volume  Final   Culture   Final    NO GROWTH 3 DAYS Performed at [facility], [address] [place], [place] [address]    Report Status PENDING  Incomplete  Blood Culture (routine x 2)     Status: None (Preliminary result)   Collection Time: 05/06/23  7:46 PM   Specimen: BLOOD  Result Value Ref Range Status   Specimen Description BLOOD BLOOD RIGHT HAND  Final   Special Requests Blood Culture adequate volume  Final   Culture   Final    NO GROWTH 3 DAYS Performed at [facility], [address]  [place], [place] [address]    Report Status PENDING  Incomplete  Resp panel by RT-PCR (RSV, Flu A&B, Covid) Anterior Nasal Swab     Status: None   Collection Time: 05/06/23  7:47 PM   Specimen: Anterior Nasal Swab  Result Value Ref Range Status   SARS Coronavirus 2 by RT PCR NEGATIVE NEGATIVE Final    Comment: (NOTE) SARS-CoV-2 target nucleic acids are NOT DETECTED.  The SARS-CoV-2 RNA is generally detectable in upper respiratory specimens during the acute phase of infection. The lowest concentration of SARS-CoV-2 viral copies this assay can detect is 138 copies/mL. A negative result does not preclude SARS-Cov-2 infection and should not be used as the sole basis for treatment or other patient management decisions. A negative result may occur with  improper specimen collection/handling, submission of specimen other than nasopharyngeal swab, presence of viral mutation(s) within the areas targeted by this assay, and inadequate number of viral copies(<138 copies/mL). A negative result must be combined with clinical observations, patient history, and  epidemiological information. The expected result is Negative.  Fact Sheet for Patients:  BloggerCourse.com  Fact Sheet for Healthcare Providers:  SeriousBroker.it  This test is no t yet approved or cleared by the [place] FDA and  has been authorized for detection and/or diagnosis of SARS-CoV-2 by FDA under an Emergency Use Authorization (EUA). This EUA will remain  in effect (meaning this test can be used) for the duration of the COVID-19 declaration under Section 564(b)(1) of the Act, 21 U.S.C.section 360bbb-3(b)(1), unless the authorization is terminated  or revoked sooner.       Influenza A by PCR NEGATIVE NEGATIVE Final   Influenza B by PCR NEGATIVE NEGATIVE Final    Comment: (NOTE) The Xpert Xpress SARS-CoV-2/FLU/RSV plus assay is intended as an aid in the diagnosis of influenza from Nasopharyngeal swab specimens and should not be used as a sole basis for treatment. Nasal washings and aspirates are unacceptable for Xpert Xpress SARS-CoV-2/FLU/RSV testing.  Fact Sheet for Patients: BloggerCourse.com  Fact Sheet for Healthcare Providers: SeriousBroker.it  This test is not yet approved or cleared by the [place] FDA and has been authorized for detection and/or diagnosis of SARS-CoV-2 by FDA under an Emergency Use Authorization (EUA). This EUA will remain in effect (meaning this test can be used) for the duration of the COVID-19 declaration under Section 564(b)(1) of the Act, 21 U.S.C. section 360bbb-3(b)(1), unless the authorization is terminated or revoked.     Resp Syncytial Virus by PCR NEGATIVE NEGATIVE Final    Comment: (NOTE) Fact Sheet for Patients: BloggerCourse.com  Fact Sheet for Healthcare Providers: SeriousBroker.it  This test is not yet approved or cleared by the [place] FDA and has been  authorized for detection and/or diagnosis of SARS-CoV-2 by FDA under an Emergency Use Authorization (EUA). This EUA will remain in effect (meaning this test can be used) for the duration of the COVID-19 declaration under Section 564(b)(1) of the Act, 21 U.S.C. section 360bbb-3(b)(1), unless the authorization is terminated or revoked.  Performed at [facility], [address] [place], [place] [address]          Radiology Studies: DG Chest 1 View  Result Date: 05/07/2023 CLINICAL DATA:  Syncope EXAM: CHEST  1 VIEW COMPARISON:  X-ray 05/06/2023 FINDINGS: Stable enlarged cardiopericardial silhouette with vascular congestion. No consolidation, pneumothorax or effusion. No edema. Overlapping cardiac leads. Previous basilar opacity on the right is improved. IMPRESSION: Improving basilar opacity. Enlarged heart with vascular congestion. Electronically Signed  By: Karen Kays M.D.   On: 05/07/2023 17:06        Scheduled Meds:  furosemide  20 mg Oral See admin instructions   gabapentin  600 mg Oral TID   heparin  5,000 Units Subcutaneous Q8H   insulin aspart  0-15 Units Subcutaneous TID WC   insulin regular human CONCENTRATED  60 Units Subcutaneous Q breakfast   And   insulin regular human CONCENTRATED  60 Units Subcutaneous Q lunch   And   insulin regular human CONCENTRATED  30 Units Subcutaneous Q supper   polyethylene glycol  17 g Oral Daily   rOPINIRole  0.5 mg Oral QHS   rOPINIRole  1 mg Oral QHS   tamsulosin  0.4 mg Oral Daily   umeclidinium bromide  1 puff Inhalation Daily   Continuous Infusions:   LOS: 0 days   Time spent:  Azucena Fallen, DO [facility]  If 7PM-7AM, please contact night-coverage [email or web address]  05/09/2023, 11:23 AM

## 2023-05-09 NOTE — TOC Progression Note (Signed)
Transition of Care (TOC) - Progression Note    Patient Details  Name: Ricky FONTENETTE Sr. MRN: [id number] Date of Birth: Jan 13, 1951  Transition of [facility]) CM/SW Contact  Annice Needy, LCSW Phone Number: 05/09/2023, 1:01 PM  Clinical Narrative:    [name] was provided bed offers. She stated that she would consider the offers and contact TOC with her decision.    Expected Discharge Plan: Home/Self Care Barriers to Discharge: Continued Medical Work up  Expected Discharge Plan and Services     Post Acute Care Choice: Home Health Living arrangements for the past 2 months: Single Family Home                           HH Arranged: PT, OT HH Agency: [facility] Date [facility] Agency Contacted: 05/08/23 Time HH Agency Contacted: 1123 Representative spoke with at [facility] Agency: Kandee Keen   Social Determinants of Health (SDOH) Interventions SDOH Screenings   Food Insecurity: No Food Insecurity (05/06/2023)  Housing: Low Risk  (05/06/2023)  Transportation Needs: No Transportation Needs (05/06/2023)  Utilities: Patient Declined (05/07/2023)  Depression (PHQ2-9): Low Risk  (11/08/2021)  Tobacco Use: Medium Risk (05/06/2023)    Readmission Risk Interventions     No data to display

## 2023-05-09 NOTE — Progress Notes (Signed)
OT Cancellation Note  Patient Details Name: Ricky MAPPS Sr. MRN: [id number] DOB: Mar 19, 1951   Cancelled Treatment:    Reason Eval/Treat Not Completed: Fatigue/lethargy limiting ability to participate. Pt lethargic and declining therapy to rest. Pt's wife present and reporting they are agreeable to SNF recommendation.   Diamante Rubin OT, MOT   Danie Chandler 05/09/2023, 9:29 AM

## 2023-05-09 NOTE — Progress Notes (Signed)
This morning c/o chest tightness. Vitals stable.  Dr. Natale Milch notified and ordered cxr, abg and lasix.  Patient was audibly wheezing this morning and received prn neb.  On last assessment wheezing had improved.

## 2023-05-09 NOTE — TOC Progression Note (Signed)
Transition of Care (TOC) - Progression Note    Patient Details  Name: Ricky PORZIO Sr. MRN: [id number] Date of Birth: Apr 06, 1951  Transition of Care [facility]) CM/SW Contact  Annice Needy, LCSW Phone Number: 05/09/2023, 10:52 AM  Clinical Narrative:    Patient and spouse have reconsidered and now desire short term rehab at [facility]. Referral sent to facilities requested.    Expected Discharge Plan: Home/Self Care Barriers to Discharge: Continued Medical Work up  Expected Discharge Plan and Services     Post Acute Care Choice: Home Health Living arrangements for the past 2 months: Single Family Home                           HH Arranged: PT, OT HH Agency: [facility] Date [facility] Agency Contacted: 05/08/23 Time HH Agency Contacted: 1123 Representative spoke with at [facility] Agency: Kandee Keen   Social Determinants of Health (SDOH) Interventions SDOH Screenings   Food Insecurity: No Food Insecurity (05/06/2023)  Housing: Low Risk  (05/06/2023)  Transportation Needs: No Transportation Needs (05/06/2023)  Utilities: Patient Declined (05/07/2023)  Depression (PHQ2-9): Low Risk  (11/08/2021)  Tobacco Use: Medium Risk (05/06/2023)    Readmission Risk Interventions     No data to display

## 2023-05-09 NOTE — NC FL2 (Signed)
[place] MEDICAID FL2 LEVEL OF CARE FORM     IDENTIFICATION  Patient Name: Ricky BIELLO Sr. Birthdate: 08-30-1951 Sex: male Admission Date (Current Location): 05/06/2023  [facility] and [place] Number:  [employer] and Address:  [facility],  [address], [place] [address]      Provider Number: [id number]  Attending Physician Name and Address:  Azucena Fallen, MD  Relative Name and Phone Number:  Clarance, [facility]) [phone]    Current Level of Care: Hospital (observation) Recommended Level of Care: Skilled Nursing Facility Prior Approval Number:    Date Approved/Denied:   PASRR Number: [id number]  Discharge Plan: SNF    Current Diagnoses: Patient Active Problem List   Diagnosis Date Noted   Class 2 obesity 05/07/2023   CKD stage 3a, GFR 45-59 ml/min (HCC) 05/07/2023   Syncope 05/06/2023   Impaired functional mobility, balance, gait, and endurance 09/27/2018   Unilateral primary osteoarthritis, left hip 02/10/2017   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   H/O total hip arthroplasty, left 02/10/2017   History of total hip arthroplasty 02/10/2017   Respiratory failure (HCC)    OSA (obstructive sleep apnea)    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   HTN (hypertension) 10/13/2016   Anemia 03/02/2016   BPH (benign prostatic hyperplasia) 03/02/2016   Type 2 diabetes mellitus with hyperlipidemia (HCC) 03/02/2016   Migraines 03/02/2016   OAB (overactive bladder) 03/02/2016   CAD (coronary artery disease) 02/09/2015   Morbid obesity with BMI of 40.0-44.9, adult (HCC) 01/29/2015   Bladder cancer (HCC) 01/29/2015   Pain in the chest    Chronic systolic heart failure (HCC)    Esophageal reflux    Chest pain 01/08/2015   Insulin dependent diabetes mellitus 01/08/2015   COPD (chronic obstructive pulmonary disease) (HCC) 01/08/2015   Hyperlipidemia associated with type 2 diabetes mellitus (HCC) 01/08/2015    Orientation  RESPIRATION BLADDER Height & Weight     Self, Time, Situation, Place  O2 (3L) Incontinent Weight: 262 lb (118.8 kg) Height:  5\' 8"  (172.7 cm)  BEHAVIORAL SYMPTOMS/MOOD NEUROLOGICAL BOWEL NUTRITION STATUS      Incontinent Diet (Heart healthy/carb modified)  AMBULATORY STATUS COMMUNICATION OF NEEDS Skin   Limited Assist Verbally Normal                       Personal Care Assistance Level of Assistance  Bathing, Feeding, Dressing Bathing Assistance: Limited assistance Feeding assistance: Independent Dressing Assistance: Limited assistance     Functional Limitations Info  Sight, Hearing, Speech Sight Info: Adequate Hearing Info: Adequate Speech Info: Adequate    SPECIAL CARE FACTORS FREQUENCY  PT (By licensed PT), OT (By licensed OT)     PT Frequency: 5x/week OT Frequency: 3x/week            Contractures Contractures Info: Not present    Additional Factors Info  Code Status, Allergies Code Status Info: Full Code Allergies Info: Poison Ivy extract, Poison Oak extract           Current Medications (05/09/2023):  This is the current hospital active medication list Current Facility-Administered Medications  Medication Dose Route Frequency Provider Last Rate Last Admin   acetaminophen (TYLENOL) tablet 650 mg  650 mg Oral Q6H PRN Elgergawy, Leana Roe, MD   650 mg at 05/08/23 8413   Or   acetaminophen (TYLENOL) suppository 650 mg  650 mg Rectal Q6H PRN Elgergawy, Leana Roe, MD       albuterol (PROVENTIL) (2.5  MG/3ML) 0.083% nebulizer solution 2.5 mg  2.5 mg Nebulization Q2H PRN Elgergawy, Leana Roe, MD   2.5 mg at 05/09/23 0823   heparin injection 5,000 Units  5,000 Units Subcutaneous Q8H Elgergawy, Leana Roe, MD   5,000 Units at 05/09/23 0602   hydrALAZINE (APRESOLINE) injection 5 mg  5 mg Intravenous Q4H PRN Elgergawy, Leana Roe, MD       insulin aspart (novoLOG) injection 0-15 Units  0-15 Units Subcutaneous TID WC Arrien, York Ram, MD   3 Units at 05/09/23 0805    insulin regular human CONCENTRATED (HUMULIN R) 500 UNIT/ML KwikPen 60 Units  60 Units Subcutaneous Q breakfast Elgergawy, Leana Roe, MD   60 Units at 05/09/23 0806   And   insulin regular human CONCENTRATED (HUMULIN R) 500 UNIT/ML KwikPen 60 Units  60 Units Subcutaneous Q lunch Elgergawy, Leana Roe, MD   60 Units at 05/08/23 1206   And   insulin regular human CONCENTRATED (HUMULIN R) 500 UNIT/ML KwikPen 30 Units  30 Units Subcutaneous Q supper Elgergawy, Leana Roe, MD   30 Units at 05/08/23 1711   oxyCODONE (Oxy IR/ROXICODONE) immediate release tablet 5 mg  5 mg Oral Q4H PRN Elgergawy, Leana Roe, MD       polyethylene glycol (MIRALAX / GLYCOLAX) packet 17 g  17 g Oral Daily Coralie Keens, MD   17 g at 05/07/23 1250   rOPINIRole (REQUIP) tablet 0.5 mg  0.5 mg Oral QHS Elgergawy, Leana Roe, MD   0.5 mg at 05/08/23 2114   rOPINIRole (REQUIP) tablet 1 mg  1 mg Oral QHS Elgergawy, Leana Roe, MD   1 mg at 05/08/23 2114   tamsulosin (FLOMAX) capsule 0.4 mg  0.4 mg Oral Daily Elgergawy, Leana Roe, MD   0.4 mg at 05/09/23 0810   umeclidinium bromide (INCRUSE ELLIPTA) 62.5 MCG/ACT 1 puff  1 puff Inhalation Daily Elgergawy, Leana Roe, MD   1 puff at 05/09/23 0740     Discharge Medications: Please see discharge summary for a list of discharge medications.  Relevant Imaging Results:  Relevant Lab Results:   Additional Information SS # [id number]  Annice Needy, LCSW

## 2023-05-10 ENCOUNTER — Inpatient Hospital Stay (HOSPITAL_COMMUNITY)
Admission: EM | Admit: 2023-05-10 | Discharge: 2023-05-18 | DRG: 208 | Disposition: A | Discharge status: Skilled Nursing Facility | Payer: No Typology Code available for payment source | Attending: Internal Medicine | Admitting: Internal Medicine

## 2023-05-10 DIAGNOSIS — I5033 Acute on chronic diastolic (congestive) heart failure: Secondary | ICD-10-CM | POA: Diagnosis present

## 2023-05-10 DIAGNOSIS — J449 Chronic obstructive pulmonary disease, unspecified: Secondary | ICD-10-CM | POA: Diagnosis present

## 2023-05-10 DIAGNOSIS — Z87891 Personal history of nicotine dependence: Secondary | ICD-10-CM

## 2023-05-10 DIAGNOSIS — R296 Repeated falls: Secondary | ICD-10-CM | POA: Diagnosis present

## 2023-05-10 DIAGNOSIS — I130 Hypertensive heart and chronic kidney disease with heart failure and stage 1 through stage 4 chronic kidney disease, or unspecified chronic kidney disease: Secondary | ICD-10-CM | POA: Diagnosis present

## 2023-05-10 DIAGNOSIS — N1832 Chronic kidney disease, stage 3b: Secondary | ICD-10-CM | POA: Diagnosis present

## 2023-05-10 DIAGNOSIS — H9190 Unspecified hearing loss, unspecified ear: Secondary | ICD-10-CM | POA: Diagnosis present

## 2023-05-10 DIAGNOSIS — Z6841 Body Mass Index (BMI) 40.0 and over, adult: Secondary | ICD-10-CM

## 2023-05-10 DIAGNOSIS — Z905 Acquired absence of kidney: Secondary | ICD-10-CM

## 2023-05-10 DIAGNOSIS — Z7984 Long term (current) use of oral hypoglycemic drugs: Secondary | ICD-10-CM

## 2023-05-10 DIAGNOSIS — J441 Chronic obstructive pulmonary disease with (acute) exacerbation: Secondary | ICD-10-CM | POA: Diagnosis present

## 2023-05-10 DIAGNOSIS — J9621 Acute and chronic respiratory failure with hypoxia: Secondary | ICD-10-CM | POA: Diagnosis not present

## 2023-05-10 DIAGNOSIS — J8410 Pulmonary fibrosis, unspecified: Secondary | ICD-10-CM | POA: Diagnosis present

## 2023-05-10 DIAGNOSIS — G4700 Insomnia, unspecified: Secondary | ICD-10-CM | POA: Diagnosis present

## 2023-05-10 DIAGNOSIS — J9622 Acute and chronic respiratory failure with hypercapnia: Secondary | ICD-10-CM | POA: Diagnosis present

## 2023-05-10 DIAGNOSIS — Z96642 Presence of left artificial hip joint: Secondary | ICD-10-CM | POA: Diagnosis present

## 2023-05-10 DIAGNOSIS — Z86718 Personal history of other venous thrombosis and embolism: Secondary | ICD-10-CM

## 2023-05-10 DIAGNOSIS — Z9981 Dependence on supplemental oxygen: Secondary | ICD-10-CM

## 2023-05-10 DIAGNOSIS — R55 Syncope and collapse: Secondary | ICD-10-CM | POA: Diagnosis not present

## 2023-05-10 DIAGNOSIS — G4733 Obstructive sleep apnea (adult) (pediatric): Secondary | ICD-10-CM | POA: Diagnosis present

## 2023-05-10 DIAGNOSIS — Z794 Long term (current) use of insulin: Secondary | ICD-10-CM

## 2023-05-10 DIAGNOSIS — N179 Acute kidney failure, unspecified: Secondary | ICD-10-CM | POA: Diagnosis present

## 2023-05-10 DIAGNOSIS — I952 Hypotension due to drugs: Secondary | ICD-10-CM | POA: Diagnosis present

## 2023-05-10 DIAGNOSIS — Z79899 Other long term (current) drug therapy: Secondary | ICD-10-CM

## 2023-05-10 DIAGNOSIS — E875 Hyperkalemia: Secondary | ICD-10-CM | POA: Diagnosis present

## 2023-05-10 DIAGNOSIS — K219 Gastro-esophageal reflux disease without esophagitis: Secondary | ICD-10-CM | POA: Diagnosis present

## 2023-05-10 DIAGNOSIS — R001 Bradycardia, unspecified: Secondary | ICD-10-CM

## 2023-05-10 DIAGNOSIS — I2510 Atherosclerotic heart disease of native coronary artery without angina pectoris: Secondary | ICD-10-CM | POA: Diagnosis present

## 2023-05-10 DIAGNOSIS — T426X5A Adverse effect of other antiepileptic and sedative-hypnotic drugs, initial encounter: Secondary | ICD-10-CM | POA: Diagnosis present

## 2023-05-10 DIAGNOSIS — C679 Malignant neoplasm of bladder, unspecified: Secondary | ICD-10-CM | POA: Diagnosis present

## 2023-05-10 DIAGNOSIS — N400 Enlarged prostate without lower urinary tract symptoms: Secondary | ICD-10-CM | POA: Diagnosis present

## 2023-05-10 DIAGNOSIS — E7800 Pure hypercholesterolemia, unspecified: Secondary | ICD-10-CM | POA: Diagnosis present

## 2023-05-10 DIAGNOSIS — I10 Essential (primary) hypertension: Secondary | ICD-10-CM | POA: Diagnosis present

## 2023-05-10 DIAGNOSIS — E1165 Type 2 diabetes mellitus with hyperglycemia: Secondary | ICD-10-CM | POA: Diagnosis present

## 2023-05-10 DIAGNOSIS — Z8551 Personal history of malignant neoplasm of bladder: Secondary | ICD-10-CM

## 2023-05-10 DIAGNOSIS — J9601 Acute respiratory failure with hypoxia: Secondary | ICD-10-CM | POA: Diagnosis present

## 2023-05-10 DIAGNOSIS — I5022 Chronic systolic (congestive) heart failure: Secondary | ICD-10-CM | POA: Diagnosis present

## 2023-05-10 DIAGNOSIS — N1831 Chronic kidney disease, stage 3a: Secondary | ICD-10-CM | POA: Diagnosis present

## 2023-05-10 DIAGNOSIS — E1122 Type 2 diabetes mellitus with diabetic chronic kidney disease: Secondary | ICD-10-CM | POA: Diagnosis present

## 2023-05-10 DIAGNOSIS — Z7951 Long term (current) use of inhaled steroids: Secondary | ICD-10-CM

## 2023-05-10 DIAGNOSIS — E1169 Type 2 diabetes mellitus with other specified complication: Secondary | ICD-10-CM | POA: Diagnosis present

## 2023-05-10 DIAGNOSIS — G2581 Restless legs syndrome: Secondary | ICD-10-CM | POA: Diagnosis present

## 2023-05-10 DIAGNOSIS — Z7982 Long term (current) use of aspirin: Secondary | ICD-10-CM

## 2023-05-10 DIAGNOSIS — J9602 Acute respiratory failure with hypercapnia: Principal | ICD-10-CM

## 2023-05-10 LAB — BASIC METABOLIC PANEL
Anion gap: 9 (ref 5–15)
BUN: 20 mg/dL (ref 8–23)
CO2: 31 mmol/L (ref 22–32)
Calcium: 8.8 mg/dL — ABNORMAL LOW (ref 8.9–10.3)
Chloride: 101 mmol/L (ref 98–111)
Creatinine, Ser: 1.13 mg/dL (ref 0.61–1.24)
GFR, Estimated: >60 mL/min (ref >60)
Glucose, Bld: 110 mg/dL — ABNORMAL HIGH (ref 70–99)
Potassium: 4.4 mmol/L (ref 3.5–5.1)
Sodium: 141 mmol/L (ref 135–145)

## 2023-05-10 LAB — CBC
HCT: 43.3 % (ref 39.0–52.0)
Hemoglobin: 12.7 g/dL — ABNORMAL LOW (ref 13.0–17.0)
MCH: 29.3 pg (ref 26.0–34.0)
MCHC: 29.3 g/dL — ABNORMAL LOW (ref 30.0–36.0)
MCV: 99.8 fL (ref 80.0–100.0)
Platelets: 135 10*3/uL — ABNORMAL LOW (ref 150–400)
RBC: 4.34 MIL/uL (ref 4.22–5.81)
RDW: 13.9 % (ref 11.5–15.5)
WBC: 4.3 10*3/uL (ref 4.0–10.5)
nRBC: 0 % (ref 0.0–0.2)

## 2023-05-10 LAB — GLUCOSE, CAPILLARY
Glucose-Capillary: 142 mg/dL — ABNORMAL HIGH (ref 70–99)
Glucose-Capillary: 262 mg/dL — ABNORMAL HIGH (ref 70–99)

## 2023-05-10 NOTE — Progress Notes (Signed)
Occupational Therapy Treatment Patient Details Name: Ricky MOSCHELLA Sr. MRN: [id number] DOB: 1951/06/15 Today's Date: 05/10/2023   History of present illness Ricky Johnston  is a 72 y.o. male, with history of upper extremity DVT, active bladder cancer (S/CP resection ), COPD, and insulin-dependent diabetes mellitus, currently Juliane CHF, chronic respiratory failure on 2 L oxygen.  -Patient presents to ED secondary to episode of loss of consciousness, patient was outside the house today, when he was trying to stand up, he felt dizzy, lightheaded, where he lost consciousness for around 30 minutes, denies chest pain, reports chronic dyspnea, reports cough for last couple weeks, notices,   OT comments  Pt agreeable to OT treatment initially with PT coming in after a few minutes due to pt's difficulty with attempts at standing with this OT alone. Pt required max A for bed mobility today and for transfer to the chair. Pt more deconditioned today with poor sitting balance with much R and posterior leaning. Pt required much time at rest and much assist for mobility. Pt left in the chair with call bell within reach. Pt will benefit from continued OT in the hospital and recommended venue below to increase strength, balance, and endurance for safe ADL's.      Recommendations for follow up therapy are one component of a multi-disciplinary discharge planning process, led by the attending physician.  Recommendations may be updated based on patient status, additional functional criteria and insurance authorization.    Assistance Recommended at Discharge Intermittent Supervision/Assistance  Patient can return home with the following  A lot of help with walking and/or transfers;A little help with bathing/dressing/bathroom;Assistance with cooking/housework;Assist for transportation;Help with stairs or ramp for entrance         Precautions / Restrictions Precautions Precautions: Fall Restrictions Weight Bearing  Restrictions: No       Mobility Bed Mobility Overal bed mobility: Needs Assistance Bed Mobility: Supine to Sit     Supine to sit: Max assist, HOB elevated     General bed mobility comments: Very weak; max A to move B LE to EOB and pull to sit.    Transfers Overall transfer level: Needs assistance Equipment used: Rolling walker (2 wheels) Transfers: Sit to/from Stand, Bed to chair/wheelchair/BSC Sit to Stand: Max assist Stand pivot transfers: Max assist         General transfer comment: Unsteady; max A to boost from EOB with assist from PT and this OT. Assist to move L LE towards the chair during mobility.     Balance Overall balance assessment: Needs assistance Sitting-balance support: Feet supported, Bilateral upper extremity supported Sitting balance-Leahy Scale: Poor Sitting balance - Comments: seated at EOB Postural control: Posterior lean, Right lateral lean Standing balance support: During functional activity, Bilateral upper extremity supported, Reliant on assistive device for balance Standing balance-Leahy Scale: Poor Standing balance comment: using RW                            Cognition Arousal/Alertness: Awake/alert Behavior During Therapy: WFL for tasks assessed/performed Overall Cognitive Status: Within Functional Limits for tasks assessed                                                             Pertinent Vitals/ Pain  Pain Assessment Pain Assessment: Faces Faces Pain Scale: Hurts even more Pain Location: Bilateral legs; L foot; R lateral torso. Pain Descriptors / Indicators: Guarding, Discomfort, Moaning Pain Intervention(s): Limited activity within patient's tolerance, Monitored during session, Repositioned                                                          Frequency  Min 2X/week        Progress Toward Goals  OT Goals(current goals can now be found in the care  plan section)  Progress towards OT goals: Progressing toward goals  Acute Rehab OT Goals Patient Stated Goal: return home OT Goal Formulation: With patient Time For Goal Achievement: 05/22/23 Potential to Achieve Goals: Good ADL Goals Pt Will Perform Grooming: with modified independence;standing Pt Will Perform Upper Body Dressing: with modified independence;sitting Pt Will Perform Lower Body Dressing: with modified independence;sitting/lateral leans;with adaptive equipment Pt Will Transfer to Toilet: with modified independence;ambulating Pt Will Perform Toileting - Clothing Manipulation and hygiene: with modified independence;sitting/lateral leans  Plan Discharge plan remains appropriate    Co-evaluation    PT/OT/SLP Co-Evaluation/Treatment: Yes Reason for Co-Treatment: To address functional/ADL transfers;Complexity of the patient's impairments (multi-system involvement)   OT goals addressed during session: ADL's and self-care                          End of Session Equipment Utilized During Treatment: Rolling walker (2 wheels);Oxygen;Gait belt  OT Visit Diagnosis: Unsteadiness on feet (R26.81);Other abnormalities of gait and mobility (R26.89);Repeated falls (R29.6);Muscle weakness (generalized) (M62.81);Dizziness and giddiness (R42)   Activity Tolerance Patient tolerated treatment well   Patient Left in chair;with call bell/phone within reach   Nurse Communication Mobility status (nurse present for mobility)        Time: 1610-9604 OT Time Calculation (min): 27 min  Charges: OT General Charges $OT Visit: 1 Visit OT Treatments $Therapeutic Exercise: 8-22 mins  Cay Kath OT, MOT  Danie Chandler 05/10/2023, 9:51 AM

## 2023-05-10 NOTE — TOC Transition Note (Signed)
Transition of Care [name]) - CM/SW Discharge Note   Patient Details  Name: Ricky Johnston. MRN: [id number] Date of Birth: 03-26-1951  Transition of Care [name]) CM/SW Contact:  Villa Herb, LCSWA Phone Number: 05/10/2023, 12:31 PM   Clinical Narrative:    CSW spoke to admissions with [facility] who states they are ready to accept pt for SNF today. [name] has been approved. D/C clinicals have been sent via HUB to facility. CSW updated pts wife of plan for D/C. CSW provided RN with numbers for room and report. RN states pt will need EMS for transport. CSW placed pt on EMS list and will send med necessity to the floor. TOC signing off.   Final next level of care: Skilled Nursing Facility Barriers to Discharge: Barriers Resolved   Patient Goals and CMS Choice CMS Medicare.gov Compare Post Acute Care list provided to:: Patient Choice offered to / list presented to : Patient  Discharge Placement                Patient chooses bed at: [name]   Name of family member notified: Wife [name] Patient and family notified of of transfer: 05/10/23  Discharge Plan and Services Additional resources added to the After Visit Summary for       Post Acute Care Choice: Home Health                    HH Arranged: PT, OT [facility] Agency: [facility] Date [facility] Agency Contacted: 05/08/23 Time HH Agency Contacted: 1123 Representative spoke with at [facility] Agency: Kandee Keen  Social Determinants of Health (SDOH) Interventions SDOH Screenings   Food Insecurity: No Food Insecurity (05/06/2023)  Housing: Low Risk  (05/06/2023)  Transportation Needs: No Transportation Needs (05/06/2023)  Utilities: Patient Declined (05/07/2023)  Depression (PHQ2-9): Low Risk  (11/08/2021)  Tobacco Use: Medium Risk (05/06/2023)     Readmission Risk Interventions     No data to display

## 2023-05-10 NOTE — Discharge Summary (Signed)
Physician Discharge Summary  Alen Suite ZOX:[id number] DOB: [birth year] DOA: 05/06/2023  PCP: [name], MD  Admit date: 05/06/2023  Discharge date: 05/10/2023  Admitted From:Home  Disposition:  SNF  Recommendations for Outpatient Follow-up:  Follow up with PCP in 1-2 weeks Continue on medications as noted below and discontinue losartan Patient remains high risk for readmission and requires SNF as he is unsafe to go home  Home Health: None  Equipment/Devices: Nasal cannula oxygen  Discharge Condition:Stable  CODE STATUS: Full  Diet recommendation: Heart Healthy/carb modified  Brief/Interim Summary:  73 yo male with the past medical history of active bladder cancer sp resection, COPD, T2DM, heart failure, and upper extremity deep vein thrombosis who presented with a syncope episode.  He was thought to have some orthostasis or vasovagal presyncopal episode, but primarily appears to have severe ambulatory dysfunction and weakness.  He was noted to have some volume overload and was diuresed throughout the course of the admission and is now in stable medical condition for discharge to SNF per PT/OT recommendations.  He continues to remain significantly weak and has difficulty sitting up.  He is not in safe condition for discharge to home.  His home losartan has been held at this time in favor of keeping a slightly higher blood pressure reading.  No other acute events or concerns noted throughout the course of the stay.  Discharge Diagnoses:  Principal Problem:   Syncope Active Problems:   COPD (chronic obstructive pulmonary disease) (HCC)   Chronic systolic heart failure (HCC)   HTN (hypertension)   CAD (coronary artery disease)   CKD stage 3a, GFR 45-59 ml/min (HCC)   Type 2 diabetes mellitus with hyperlipidemia (HCC)   BPH (benign prostatic hyperplasia)   Class 2 obesity  Principal discharge diagnosis: Likely vasovagal presyncopal episode with severe ambulatory  dysfunction.  Discharge Instructions  Discharge Instructions     Diet - low sodium heart healthy   Complete by: As directed    Increase activity slowly   Complete by: As directed       Allergies as of 05/10/2023       Reactions   Poison Ivy Extract [poison Ivy Extract] Rash   Poison Oak Extract Rash        Medication List     STOP taking these medications    losartan 50 MG tablet Commonly known as: COZAAR       TAKE these medications    Alavert 10 MG dissolvable tablet Generic drug: loratadine Take 10 mg by mouth daily as needed for allergies.   albuterol 108 (90 Base) MCG/ACT inhaler Commonly known as: VENTOLIN HFA Inhale 2 puffs into the lungs every 6 (six) hours as needed for wheezing or shortness of breath.   aspirin 81 MG chewable tablet Chew 1 tablet (81 mg total) by mouth 2 (two) times daily after a meal. What changed: when to take this   atorvastatin 80 MG tablet Commonly known as: LIPITOR Take 80 mg by mouth at bedtime.   cholecalciferol 1000 units tablet Commonly known as: VITAMIN D Take 1,000 Units by mouth daily.   ferrous sulfate 325 (65 FE) MG tablet Take 325 mg by mouth daily.   fluocinonide 0.05 % external solution Commonly known as: LIDEX Apply 1 Application topically daily as needed (itchy rash on scalp).   fluticasone 50 MCG/ACT nasal spray Commonly known as: FLONASE Place 1-2 sprays into both nostrils daily as needed for allergies.   Fluticasone-Salmeterol 250-50 MCG/DOSE Aepb  Commonly known as: ADVAIR Inhale 1 puff into the lungs daily.   furosemide 20 MG tablet Commonly known as: LASIX Take 20 mg by mouth See admin instructions. 20 mg once daily. May increase to 20 mg BID or 40 mg QD as needed for fluid overload.   gabapentin 300 MG capsule Commonly known as: NEURONTIN Take 600 mg by mouth 3 (three) times daily.   HumuLIN R U-500 KwikPen 500 UNIT/ML KwikPen Generic drug: insulin regular human CONCENTRATED Inject  100-110 Units into the skin See admin instructions. 110 units with breakfast and lunch. 110-110 units with supper depending on blood sugar.   hydrocortisone 2.5 % cream Apply 1 Application topically 2 (two) times daily as needed (rash on face).   Jardiance 25 MG Tabs tablet Generic drug: empagliflozin Take 25 mg by mouth daily.   ketoconazole 2 % shampoo Commonly known as: NIZORAL Apply 1 Application topically every Monday, Wednesday, and Friday.   MOUNJARO Corinne Inject 1 Dose into the skin every Sunday.   OXYGEN Inhale 2-3 L/min into the lungs See admin instructions. 2 L/min continuous. May increase to 3 L/min as needed for shortness of breath.   rOPINIRole 1 MG tablet Commonly known as: REQUIP Take 1 tablet by mouth at bedtime.   rOPINIRole 0.5 MG tablet Commonly known as: REQUIP Take 0.5 mg by mouth at bedtime.   Spiriva Respimat 2.5 MCG/ACT Aers Generic drug: Tiotropium Bromide Monohydrate Inhale 2 each into the lungs daily as needed (wheezing, shortness of breath.).   tamsulosin 0.4 MG Caps capsule Commonly known as: FLOMAX Take 0.4 mg by mouth daily.        Contact information for follow-up providers     [facility], [facility] Follow up.   Specialty: Home Health Services Why: [facility] will provide home health physical therapy and occupational therapy for patient. Contact information: [address] [place] [address] [phone]         [name], MD. Schedule an appointment as soon as possible for a visit in 1 week(s).   Specialty: Student Contact information: [address] [place] [address] [phone]              Contact information for after-discharge care     Destination     [name] .   Service: Skilled Nursing Contact information: [address] Test Update Address [place] [address] [phone]                    Allergies  Allergen Reactions   Poison  Ivy Extract [Poison Lajoyce Corners Extract] Rash   Poison Oak Extract Rash    Consultations: None   Procedures/Studies: DG CHEST PORT 1 VIEW  Result Date: 05/09/2023 CLINICAL DATA:  Dyspnea and respiratory abnormalities. EXAM: PORTABLE CHEST 1 VIEW COMPARISON:  Radiograph 05/07/2023 FINDINGS: Chronic shot unchanged cardiomegaly. Stable mediastinal contours. Lower lung volumes from prior exam. Vascular congestion again seen, unchanged allowing for differences in technique. Minor left lung base atelectasis. No developing consolidation. No pleural fluid or pneumothorax. IMPRESSION: 1. Lower lung volumes from prior exam. Unchanged cardiomegaly and vascular congestion. 2. Minor left lung base atelectasis. Electronically Signed   By: Narda Rutherford M.D.   On: 05/09/2023 15:23   DG Chest 1 View  Result Date: 05/07/2023 CLINICAL DATA:  Syncope EXAM: CHEST  1 VIEW COMPARISON:  X-ray 05/06/2023 FINDINGS: Stable enlarged cardiopericardial silhouette with vascular congestion. No consolidation, pneumothorax or effusion. No edema. Overlapping cardiac leads. Previous basilar  opacity on the right is improved. IMPRESSION: Improving basilar opacity. Enlarged heart with vascular congestion. Electronically Signed   By: Karen Kays M.D.   On: 05/07/2023 17:06   DG Toe Great Left  Result Date: 05/06/2023 CLINICAL DATA:  Status post trauma 2 days ago. EXAM: LEFT GREAT TOE COMPARISON:  None Available. FINDINGS: There is no evidence of fracture or dislocation. There is no evidence of arthropathy or other focal bone abnormality. Soft tissues are unremarkable. IMPRESSION: Negative. Electronically Signed   By: Aram Candela M.D.   On: 05/06/2023 19:36   DG Chest Port 1 View  Result Date: 05/06/2023 CLINICAL DATA:  Syncope and elevated temperature. EXAM: PORTABLE CHEST 1 VIEW COMPARISON:  February 10, 2017 FINDINGS: The cardiac silhouette is mildly enlarged and unchanged in size. Mild atelectasis and/or infiltrate is seen within  the right lung base. There is no evidence of a pleural effusion or pneumothorax. The visualized skeletal structures are unremarkable. IMPRESSION: Mild right basilar atelectasis and/or infiltrate. Electronically Signed   By: Aram Candela M.D.   On: 05/06/2023 19:35     Discharge Exam: Vitals:   05/10/23 0516 05/10/23 0721  BP: 132/63   Pulse: 84   Resp: 19   Temp: (!) 97.5 F (36.4 C)   SpO2: 94% 95%   Vitals:   05/09/23 1202 05/09/23 2106 05/10/23 0516 05/10/23 0721  BP: 111/73 (!) 103/46 132/63   Pulse: 79 70 84   Resp: 20  19   Temp: 98 F (36.7 C) 98.4 F (36.9 C) (!) 97.5 F (36.4 C)   TempSrc:   Oral   SpO2: 97% 97% 94% 95%  Weight:      Height:        General: Pt is alert, awake, not in acute distress Cardiovascular: RRR, S1/S2 +, no rubs, no gallops Respiratory: CTA bilaterally, no wheezing, no rhonchi Abdominal: Soft, NT, ND, bowel sounds + Extremities: no edema, no cyanosis    The results of significant diagnostics from this hospitalization (including imaging, microbiology, ancillary and laboratory) are listed below for reference.     Microbiology: Recent Results (from the past 240 hour(s))  Blood Culture (routine x 2)     Status: None (Preliminary result)   Collection Time: 05/06/23  7:43 PM   Specimen: BLOOD  Result Value Ref Range Status   Specimen Description BLOOD BLOOD LEFT HAND  Final   Special Requests Blood Culture adequate volume  Final   Culture   Final    NO GROWTH 4 DAYS Performed at [facility], [address], [place], [place] [address]    Report Status PENDING  Incomplete  Blood Culture (routine x 2)     Status: None (Preliminary result)   Collection Time: 05/06/23  7:46 PM   Specimen: BLOOD  Result Value Ref Range Status   Specimen Description BLOOD BLOOD RIGHT HAND  Final   Special Requests Blood Culture adequate volume  Final   Culture   Final    NO GROWTH 4 DAYS Performed at [facility], [address] [place], [place]  [address]    Report Status PENDING  Incomplete  Resp panel by RT-PCR (RSV, Flu A&B, Covid) Anterior Nasal Swab     Status: None   Collection Time: 05/06/23  7:47 PM   Specimen: Anterior Nasal Swab  Result Value Ref Range Status   SARS Coronavirus 2 by RT PCR NEGATIVE NEGATIVE Final    Comment: (NOTE) SARS-CoV-2 target nucleic acids are NOT DETECTED.  The SARS-CoV-2 RNA  is generally detectable in upper respiratory specimens during the acute phase of infection. The lowest concentration of SARS-CoV-2 viral copies this assay can detect is 138 copies/mL. A negative result does not preclude SARS-Cov-2 infection and should not be used as the sole basis for treatment or other patient management decisions. A negative result may occur with  improper specimen collection/handling, submission of specimen other than nasopharyngeal swab, presence of viral mutation(s) within the areas targeted by this assay, and inadequate number of viral copies(<138 copies/mL). A negative result must be combined with clinical observations, patient history, and epidemiological information. The expected result is Negative.  Fact Sheet for Patients:  BloggerCourse.com  Fact Sheet for Healthcare Providers:  SeriousBroker.it  This test is no t yet approved or cleared by the [place] FDA and  has been authorized for detection and/or diagnosis of SARS-CoV-2 by FDA under an Emergency Use Authorization (EUA). This EUA will remain  in effect (meaning this test can be used) for the duration of the COVID-19 declaration under Section 564(b)(1) of the Act, 21 U.S.C.section 360bbb-3(b)(1), unless the authorization is terminated  or revoked sooner.       Influenza A by PCR NEGATIVE NEGATIVE Final   Influenza B by PCR NEGATIVE NEGATIVE Final    Comment: (NOTE) The Xpert Xpress SARS-CoV-2/FLU/RSV plus assay is intended as an aid in the diagnosis of influenza from  Nasopharyngeal swab specimens and should not be used as a sole basis for treatment. Nasal washings and aspirates are unacceptable for Xpert Xpress SARS-CoV-2/FLU/RSV testing.  Fact Sheet for Patients: BloggerCourse.com  Fact Sheet for Healthcare Providers: SeriousBroker.it  This test is not yet approved or cleared by the [place] FDA and has been authorized for detection and/or diagnosis of SARS-CoV-2 by FDA under an Emergency Use Authorization (EUA). This EUA will remain in effect (meaning this test can be used) for the duration of the COVID-19 declaration under Section 564(b)(1) of the Act, 21 U.S.C. section 360bbb-3(b)(1), unless the authorization is terminated or revoked.     Resp Syncytial Virus by PCR NEGATIVE NEGATIVE Final    Comment: (NOTE) Fact Sheet for Patients: BloggerCourse.com  Fact Sheet for Healthcare Providers: SeriousBroker.it  This test is not yet approved or cleared by the [place] FDA and has been authorized for detection and/or diagnosis of SARS-CoV-2 by FDA under an Emergency Use Authorization (EUA). This EUA will remain in effect (meaning this test can be used) for the duration of the COVID-19 declaration under Section 564(b)(1) of the Act, 21 U.S.C. section 360bbb-3(b)(1), unless the authorization is terminated or revoked.  Performed at [facility], [address], [place], [place] [address]      Labs: BNP (last 3 results) No results for input(s): "BNP" in the last 8760 hours. Basic Metabolic Panel: Recent Labs  Lab 05/06/23 1845 05/07/23 0322 05/08/23 0418 05/10/23 0406  NA 135 142 140 141  K 4.0 4.2 4.2 4.4  CL 97* 104 104 101  CO2 31 30 29 31   GLUCOSE 346* 237* 84 110*  BUN 26* 22 17 20   CREATININE 1.60* 1.34* 1.11 1.13  CALCIUM 8.0* 7.7* 8.1* 8.8*   Liver Function Tests: Recent Labs  Lab 05/06/23 1845  AST 15  ALT 13   ALKPHOS 76  BILITOT 0.7  PROT 6.6  ALBUMIN 3.1*   No results for input(s): "LIPASE", "AMYLASE" in the last 168 hours. No results for input(s): "AMMONIA" in the last 168 hours. CBC: Recent Labs  Lab 05/06/23 1845 05/07/23 0322 05/08/23 0418 05/10/23  0406  WBC 4.5 4.8 4.5 4.3  NEUTROABS 3.1  --  2.9  --   HGB 13.0 12.5* 12.0* 12.7*  HCT 41.9 42.1 40.6 43.3  MCV 95.9 99.5 99.0 99.8  PLT 154 139* 138* 135*   Cardiac Enzymes: No results for input(s): "CKTOTAL", "CKMB", "CKMBINDEX", "TROPONINI" in the last 168 hours. BNP: Invalid input(s): "POCBNP" CBG: Recent Labs  Lab 05/09/23 1114 05/09/23 1631 05/09/23 2056 05/10/23 0734 05/10/23 1152  GLUCAP 121* 131* 100* 142* 262*   D-Dimer No results for input(s): "DDIMER" in the last 72 hours. Hgb A1c No results for input(s): "HGBA1C" in the last 72 hours. Lipid Profile No results for input(s): "CHOL", "HDL", "LDLCALC", "TRIG", "CHOLHDL", "LDLDIRECT" in the last 72 hours. Thyroid function studies No results for input(s): "TSH", "T4TOTAL", "T3FREE", "THYROIDAB" in the last 72 hours.  Invalid input(s): "FREET3" Anemia work up No results for input(s): "VITAMINB12", "FOLATE", "FERRITIN", "TIBC", "IRON", "RETICCTPCT" in the last 72 hours. Urinalysis    Component Value Date/Time   COLORURINE STRAW (A) 05/06/2023 2100   APPEARANCEUR CLEAR 05/06/2023 2100   LABSPEC 1.020 05/06/2023 2100   PHURINE 6.0 05/06/2023 2100   GLUCOSEU >=500 (A) 05/06/2023 2100   HGBUR NEGATIVE 05/06/2023 2100   BILIRUBINUR NEGATIVE 05/06/2023 2100   KETONESUR NEGATIVE 05/06/2023 2100   PROTEINUR NEGATIVE 05/06/2023 2100   NITRITE NEGATIVE 05/06/2023 2100   LEUKOCYTESUR NEGATIVE 05/06/2023 2100   Sepsis Labs Recent Labs  Lab 05/06/23 1845 05/07/23 0322 05/08/23 0418 05/10/23 0406  WBC 4.5 4.8 4.5 4.3   Microbiology Recent Results (from the past 240 hour(s))  Blood Culture (routine x 2)     Status: None (Preliminary result)   Collection  Time: 05/06/23  7:43 PM   Specimen: BLOOD  Result Value Ref Range Status   Specimen Description BLOOD BLOOD LEFT HAND  Final   Special Requests Blood Culture adequate volume  Final   Culture   Final    NO GROWTH 4 DAYS Performed at [facility], [address], [place], [place] [address]    Report Status PENDING  Incomplete  Blood Culture (routine x 2)     Status: None (Preliminary result)   Collection Time: 05/06/23  7:46 PM   Specimen: BLOOD  Result Value Ref Range Status   Specimen Description BLOOD BLOOD RIGHT HAND  Final   Special Requests Blood Culture adequate volume  Final   Culture   Final    NO GROWTH 4 DAYS Performed at [facility], [address], [place], [place] [address]    Report Status PENDING  Incomplete  Resp panel by RT-PCR (RSV, Flu A&B, Covid) Anterior Nasal Swab     Status: None   Collection Time: 05/06/23  7:47 PM   Specimen: Anterior Nasal Swab  Result Value Ref Range Status   SARS Coronavirus 2 by RT PCR NEGATIVE NEGATIVE Final    Comment: (NOTE) SARS-CoV-2 target nucleic acids are NOT DETECTED.  The SARS-CoV-2 RNA is generally detectable in upper respiratory specimens during the acute phase of infection. The lowest concentration of SARS-CoV-2 viral copies this assay can detect is 138 copies/mL. A negative result does not preclude SARS-Cov-2 infection and should not be used as the sole basis for treatment or other patient management decisions. A negative result may occur with  improper specimen collection/handling, submission of specimen other than nasopharyngeal swab, presence of viral mutation(s) within the areas targeted by this assay, and inadequate number of viral copies(<138 copies/mL). A negative result must be combined with clinical observations,  patient history, and epidemiological information. The expected result is Negative.  Fact Sheet for Patients:  BloggerCourse.com  Fact Sheet for Healthcare Providers:   SeriousBroker.it  This test is no t yet approved or cleared by the [place] FDA and  has been authorized for detection and/or diagnosis of SARS-CoV-2 by FDA under an Emergency Use Authorization (EUA). This EUA will remain  in effect (meaning this test can be used) for the duration of the COVID-19 declaration under Section 564(b)(1) of the Act, 21 U.S.C.section 360bbb-3(b)(1), unless the authorization is terminated  or revoked sooner.       Influenza A by PCR NEGATIVE NEGATIVE Final   Influenza B by PCR NEGATIVE NEGATIVE Final    Comment: (NOTE) The Xpert Xpress SARS-CoV-2/FLU/RSV plus assay is intended as an aid in the diagnosis of influenza from Nasopharyngeal swab specimens and should not be used as a sole basis for treatment. Nasal washings and aspirates are unacceptable for Xpert Xpress SARS-CoV-2/FLU/RSV testing.  Fact Sheet for Patients: BloggerCourse.com  Fact Sheet for Healthcare Providers: SeriousBroker.it  This test is not yet approved or cleared by the [place] FDA and has been authorized for detection and/or diagnosis of SARS-CoV-2 by FDA under an Emergency Use Authorization (EUA). This EUA will remain in effect (meaning this test can be used) for the duration of the COVID-19 declaration under Section 564(b)(1) of the Act, 21 U.S.C. section 360bbb-3(b)(1), unless the authorization is terminated or revoked.     Resp Syncytial Virus by PCR NEGATIVE NEGATIVE Final    Comment: (NOTE) Fact Sheet for Patients: BloggerCourse.com  Fact Sheet for Healthcare Providers: SeriousBroker.it  This test is not yet approved or cleared by the [place] FDA and has been authorized for detection and/or diagnosis of SARS-CoV-2 by FDA under an Emergency Use Authorization (EUA). This EUA will remain in effect (meaning this test can be used) for  the duration of the COVID-19 declaration under Section 564(b)(1) of the Act, 21 U.S.C. section 360bbb-3(b)(1), unless the authorization is terminated or revoked.  Performed at [facility], [address] [place], [place] [address]      Time coordinating discharge: 35 minutes  SIGNED:   Erick Blinks, DO [facility] 05/10/2023, 12:09 PM  If 7PM-7AM, please contact night-coverage [email or web address]

## 2023-05-10 NOTE — Progress Notes (Signed)
Physical Therapy Treatment Patient Details Name: Ricky KORNFELD Sr. MRN: [id number] DOB: 07/24/1951 Today's Date: 05/10/2023   History of Present Illness Ricky Johnston  is a 72 y.o. male, with history of upper extremity DVT, active bladder cancer (S/CP resection ), COPD, and insulin-dependent diabetes mellitus, currently Juliane CHF, chronic respiratory failure on 2 L oxygen.  -Patient presents to ED secondary to episode of loss of consciousness, patient was outside the house today, when he was trying to stand up, he felt dizzy, lightheaded, where he lost consciousness for around 30 minutes, denies chest pain, reports chronic dyspnea, reports cough for last couple weeks, notices,    PT Comments    Patient demonstrates slow labored movement for sitting up at bedside with difficulty maintaining sitting balance due to weakness, very unsteady on feet and limited to a couple of shuffling side steps before having to sit due to loss of balance requiring Max assist to avoid falling.  Patient tolerated sitting up in chair and later unable to transfer using RW requiring Max assist stand pivot with knees blocked to put back to bed.  Patient will benefit from continued skilled physical therapy in hospital and recommended venue below to increase strength, balance, endurance for safe ADLs and gait.     Recommendations for follow up therapy are one component of a multi-disciplinary discharge planning process, led by the attending physician.  Recommendations may be updated based on patient status, additional functional criteria and insurance authorization.  Follow Up Recommendations  Can patient physically be transported by private vehicle: No    Assistance Recommended at Discharge Set up Supervision/Assistance  Patient can return home with the following A lot of help with bathing/dressing/bathroom;A lot of help with walking and/or transfers;Help with stairs or ramp for entrance;Assistance with cooking/housework    Equipment Recommendations  None recommended by PT    Recommendations for Other Services       Precautions / Restrictions Precautions Precautions: Fall Restrictions Weight Bearing Restrictions: No     Mobility  Bed Mobility Overal bed mobility: Needs Assistance Bed Mobility: Supine to Sit, Sit to Supine     Supine to sit: Max assist Sit to supine: Mod assist, Max assist   General bed mobility comments: labored movement requiring Max assist for moving legs onto/off bed    Transfers Overall transfer level: Needs assistance Equipment used: Rolling walker (2 wheels) Transfers: Sit to/from Stand, Bed to chair/wheelchair/BSC Sit to Stand: Max assist Stand pivot transfers: Max assist Step pivot transfers: Max assist       General transfer comment: poor carryover for using RW due to right hand slipping off walker requiring Max asisst to avoid falling, required stand pivot with knees bloced when put back to bed    Ambulation/Gait Ambulation/Gait assistance: Max assist Gait Distance (Feet): 2 Feet Assistive device: Rolling walker (2 wheels) Gait Pattern/deviations: Decreased step length - right, Decreased step length - left, Decreased stride length, Knees buckling, Shuffle Gait velocity: slow     General Gait Details: limited to a couple of shuffling side steps using RW with frequent loss of balance   Stairs             Wheelchair Mobility    Modified Rankin (Stroke Patients Only)       Balance Overall balance assessment: Needs assistance Sitting-balance support: Feet supported, Bilateral upper extremity supported Sitting balance-Leahy Scale: Poor Sitting balance - Comments: seated at EOB Postural control: Posterior lean, Right lateral lean Standing balance support: During functional activity, Bilateral upper  extremity supported, Reliant on assistive device for balance Standing balance-Leahy Scale: Poor Standing balance comment: using RW                             Cognition Arousal/Alertness: Awake/alert Behavior During Therapy: WFL for tasks assessed/performed Overall Cognitive Status: Within Functional Limits for tasks assessed                                          Exercises General Exercises - Lower Extremity Ankle Circles/Pumps: Seated, AROM, Strengthening, Both, 5 reps    General Comments        Pertinent Vitals/Pain Pain Assessment Pain Assessment: Faces Faces Pain Scale: Hurts even more Pain Location: Bilateral legs; L foot; R lateral torso. Pain Descriptors / Indicators: Guarding, Discomfort, Moaning Pain Intervention(s): Limited activity within patient's tolerance, Monitored during session, Repositioned    Home Living                          Prior Function            PT Goals (current goals can now be found in the care plan section) Acute Rehab PT Goals Patient Stated Goal: return home PT Goal Formulation: With patient Time For Goal Achievement: 05/22/23 Potential to Achieve Goals: Good Progress towards PT goals: Progressing toward goals    Frequency    Min 3X/week      PT Plan Current plan remains appropriate    Co-evaluation PT/OT/SLP Co-Evaluation/Treatment: Yes Reason for Co-Treatment: To address functional/ADL transfers;Complexity of the patient's impairments (multi-system involvement) PT goals addressed during session: Mobility/safety with mobility;Balance;Proper use of DME;Strengthening/ROM        AM-PAC PT "6 Clicks" Mobility   Outcome Measure  Help needed turning from your back to your side while in a flat bed without using bedrails?: A Lot Help needed moving from lying on your back to sitting on the side of a flat bed without using bedrails?: A Lot Help needed moving to and from a bed to a chair (including a wheelchair)?: A Lot Help needed standing up from a chair using your arms (e.g., wheelchair or bedside chair)?: A Lot Help needed to  walk in hospital room?: A Lot Help needed climbing 3-5 steps with a railing? : Total 6 Click Score: 11    End of Session Equipment Utilized During Treatment: Oxygen Activity Tolerance: Patient tolerated treatment well;Patient limited by fatigue Patient left: in chair;with call bell/phone within reach;with family/visitor present Nurse Communication: Mobility status PT Visit Diagnosis: Unsteadiness on feet (R26.81);Other abnormalities of gait and mobility (R26.89);Muscle weakness (generalized) (M62.81)     Time: 1610-9604 PT Time Calculation (min) (ACUTE ONLY): 24 min  Charges:  $Therapeutic Activity: 23-37 mins                     2:24 PM, 05/10/23 Ocie Bob, MPT Physical Therapist with [facility] [phone] office [phone] mobile phone

## 2023-05-11 ENCOUNTER — Emergency Department (HOSPITAL_COMMUNITY): Payer: No Typology Code available for payment source

## 2023-05-11 ENCOUNTER — Other Ambulatory Visit: Payer: Self-pay

## 2023-05-11 ENCOUNTER — Inpatient Hospital Stay (HOSPITAL_COMMUNITY): Payer: No Typology Code available for payment source

## 2023-05-11 DIAGNOSIS — J438 Other emphysema: Secondary | ICD-10-CM | POA: Diagnosis not present

## 2023-05-11 DIAGNOSIS — E1169 Type 2 diabetes mellitus with other specified complication: Secondary | ICD-10-CM | POA: Diagnosis present

## 2023-05-11 DIAGNOSIS — J9621 Acute and chronic respiratory failure with hypoxia: Secondary | ICD-10-CM

## 2023-05-11 DIAGNOSIS — I5033 Acute on chronic diastolic (congestive) heart failure: Secondary | ICD-10-CM | POA: Diagnosis present

## 2023-05-11 DIAGNOSIS — N179 Acute kidney failure, unspecified: Secondary | ICD-10-CM | POA: Diagnosis present

## 2023-05-11 DIAGNOSIS — J9622 Acute and chronic respiratory failure with hypercapnia: Secondary | ICD-10-CM

## 2023-05-11 DIAGNOSIS — E7800 Pure hypercholesterolemia, unspecified: Secondary | ICD-10-CM | POA: Diagnosis present

## 2023-05-11 DIAGNOSIS — E1165 Type 2 diabetes mellitus with hyperglycemia: Secondary | ICD-10-CM | POA: Diagnosis present

## 2023-05-11 DIAGNOSIS — Z7982 Long term (current) use of aspirin: Secondary | ICD-10-CM | POA: Diagnosis not present

## 2023-05-11 DIAGNOSIS — Z9981 Dependence on supplemental oxygen: Secondary | ICD-10-CM | POA: Diagnosis not present

## 2023-05-11 DIAGNOSIS — R001 Bradycardia, unspecified: Secondary | ICD-10-CM | POA: Diagnosis not present

## 2023-05-11 DIAGNOSIS — R578 Other shock: Secondary | ICD-10-CM | POA: Diagnosis not present

## 2023-05-11 DIAGNOSIS — G4700 Insomnia, unspecified: Secondary | ICD-10-CM | POA: Diagnosis present

## 2023-05-11 DIAGNOSIS — Z6841 Body Mass Index (BMI) 40.0 and over, adult: Secondary | ICD-10-CM | POA: Diagnosis not present

## 2023-05-11 DIAGNOSIS — J441 Chronic obstructive pulmonary disease with (acute) exacerbation: Secondary | ICD-10-CM | POA: Diagnosis present

## 2023-05-11 DIAGNOSIS — Z794 Long term (current) use of insulin: Secondary | ICD-10-CM | POA: Diagnosis not present

## 2023-05-11 DIAGNOSIS — N400 Enlarged prostate without lower urinary tract symptoms: Secondary | ICD-10-CM | POA: Diagnosis present

## 2023-05-11 DIAGNOSIS — G2581 Restless legs syndrome: Secondary | ICD-10-CM | POA: Diagnosis present

## 2023-05-11 DIAGNOSIS — N1832 Chronic kidney disease, stage 3b: Secondary | ICD-10-CM | POA: Diagnosis present

## 2023-05-11 DIAGNOSIS — J8410 Pulmonary fibrosis, unspecified: Secondary | ICD-10-CM | POA: Diagnosis present

## 2023-05-11 DIAGNOSIS — E1122 Type 2 diabetes mellitus with diabetic chronic kidney disease: Secondary | ICD-10-CM | POA: Diagnosis present

## 2023-05-11 DIAGNOSIS — R55 Syncope and collapse: Secondary | ICD-10-CM | POA: Diagnosis not present

## 2023-05-11 DIAGNOSIS — J9602 Acute respiratory failure with hypercapnia: Secondary | ICD-10-CM | POA: Diagnosis not present

## 2023-05-11 DIAGNOSIS — J9601 Acute respiratory failure with hypoxia: Secondary | ICD-10-CM | POA: Diagnosis present

## 2023-05-11 DIAGNOSIS — Z8551 Personal history of malignant neoplasm of bladder: Secondary | ICD-10-CM | POA: Diagnosis not present

## 2023-05-11 DIAGNOSIS — I130 Hypertensive heart and chronic kidney disease with heart failure and stage 1 through stage 4 chronic kidney disease, or unspecified chronic kidney disease: Secondary | ICD-10-CM | POA: Diagnosis present

## 2023-05-11 DIAGNOSIS — E875 Hyperkalemia: Secondary | ICD-10-CM | POA: Diagnosis present

## 2023-05-11 DIAGNOSIS — Z96642 Presence of left artificial hip joint: Secondary | ICD-10-CM | POA: Diagnosis present

## 2023-05-11 DIAGNOSIS — Z87891 Personal history of nicotine dependence: Secondary | ICD-10-CM | POA: Diagnosis not present

## 2023-05-11 LAB — I-STAT ARTERIAL BLOOD GAS, ED
Acid-Base Excess: 4 mmol/L — ABNORMAL HIGH (ref 0.0–2.0)
Acid-Base Excess: 5 mmol/L — ABNORMAL HIGH (ref 0.0–2.0)
Bicarbonate: 35.2 mmol/L — ABNORMAL HIGH (ref 20.0–28.0)
Bicarbonate: 31.6 mmol/L — ABNORMAL HIGH (ref 20.0–28.0)
Calcium, Ion: 1.24 mmol/L (ref 1.15–1.40)
Calcium, Ion: 1.18 mmol/L (ref 1.15–1.40)
HCT: 44 % (ref 39.0–52.0)
HCT: 42 % (ref 39.0–52.0)
Hemoglobin: 15 g/dL (ref 13.0–17.0)
Hemoglobin: 14.3 g/dL (ref 13.0–17.0)
O2 Saturation: 100 %
O2 Saturation: 100 %
Patient temperature: 99.1
Patient temperature: 98.7
Potassium: 5.5 mmol/L — ABNORMAL HIGH (ref 3.5–5.1)
Potassium: 5.3 mmol/L — ABNORMAL HIGH (ref 3.5–5.1)
Sodium: 139 mmol/L (ref 135–145)
Sodium: 138 mmol/L (ref 135–145)
TCO2: 38 mmol/L — ABNORMAL HIGH (ref 22–32)
TCO2: 33 mmol/L — ABNORMAL HIGH (ref 22–32)
pCO2 arterial: 83.8 mmHg (ref 32–48)
pCO2 arterial: 54.6 mmHg — ABNORMAL HIGH (ref 32–48)
pH, Arterial: 7.233 — ABNORMAL LOW (ref 7.35–7.45)
pH, Arterial: 7.372 (ref 7.35–7.45)
pO2, Arterial: 471 mmHg — ABNORMAL HIGH (ref 83–108)
pO2, Arterial: 209 mmHg — ABNORMAL HIGH (ref 83–108)

## 2023-05-11 LAB — I-STAT VENOUS BLOOD GAS, ED
Acid-Base Excess: 6 mmol/L — ABNORMAL HIGH (ref 0.0–2.0)
Acid-Base Excess: 6 mmol/L — ABNORMAL HIGH (ref 0.0–2.0)
Bicarbonate: 31.9 mmol/L — ABNORMAL HIGH (ref 20.0–28.0)
Bicarbonate: 36 mmol/L — ABNORMAL HIGH (ref 20.0–28.0)
Calcium, Ion: 1.1 mmol/L — ABNORMAL LOW (ref 1.15–1.40)
Calcium, Ion: 1.2 mmol/L (ref 1.15–1.40)
HCT: 44 % (ref 39.0–52.0)
HCT: 44 % (ref 39.0–52.0)
Hemoglobin: 15 g/dL (ref 13.0–17.0)
Hemoglobin: 15 g/dL (ref 13.0–17.0)
O2 Saturation: 100 %
O2 Saturation: 86 %
Potassium: 4.7 mmol/L (ref 3.5–5.1)
Potassium: 5.8 mmol/L — ABNORMAL HIGH (ref 3.5–5.1)
Sodium: 138 mmol/L (ref 135–145)
Sodium: 141 mmol/L (ref 135–145)
TCO2: 33 mmol/L — ABNORMAL HIGH (ref 22–32)
TCO2: 38 mmol/L — ABNORMAL HIGH (ref 22–32)
pCO2, Ven: 47.3 mmHg (ref 44–60)
pCO2, Ven: 75.5 mmHg (ref 44–60)
pH, Ven: 7.287 (ref 7.25–7.43)
pH, Ven: 7.437 — ABNORMAL HIGH (ref 7.25–7.43)
pO2, Ven: 200 mmHg — ABNORMAL HIGH (ref 32–45)
pO2, Ven: 61 mmHg — ABNORMAL HIGH (ref 32–45)

## 2023-05-11 LAB — CBC WITH DIFFERENTIAL/PLATELET
Abs Immature Granulocytes: 0.02 10*3/uL (ref 0.00–0.07)
Basophils Absolute: 0 10*3/uL (ref 0.0–0.1)
Basophils Relative: 1 %
Eosinophils Absolute: 0.1 10*3/uL (ref 0.0–0.5)
Eosinophils Relative: 2 %
HCT: 47.8 % (ref 39.0–52.0)
Hemoglobin: 14.1 g/dL (ref 13.0–17.0)
Immature Granulocytes: 0 %
Lymphocytes Relative: 21 %
Lymphs Abs: 1.2 10*3/uL (ref 0.7–4.0)
MCH: 29.7 pg (ref 26.0–34.0)
MCHC: 29.5 g/dL — ABNORMAL LOW (ref 30.0–36.0)
MCV: 100.6 fL — ABNORMAL HIGH (ref 80.0–100.0)
Monocytes Absolute: 0.5 10*3/uL (ref 0.1–1.0)
Monocytes Relative: 9 %
Neutro Abs: 3.7 10*3/uL (ref 1.7–7.7)
Neutrophils Relative %: 67 %
Platelets: 143 10*3/uL — ABNORMAL LOW (ref 150–400)
RBC: 4.75 MIL/uL (ref 4.22–5.81)
RDW: 13.7 % (ref 11.5–15.5)
WBC: 5.6 10*3/uL (ref 4.0–10.5)
nRBC: 0 % (ref 0.0–0.2)

## 2023-05-11 LAB — GLUCOSE, CAPILLARY
Glucose-Capillary: 135 mg/dL — ABNORMAL HIGH (ref 70–99)
Glucose-Capillary: 157 mg/dL — ABNORMAL HIGH (ref 70–99)
Glucose-Capillary: 162 mg/dL — ABNORMAL HIGH (ref 70–99)
Glucose-Capillary: 170 mg/dL — ABNORMAL HIGH (ref 70–99)
Glucose-Capillary: 197 mg/dL — ABNORMAL HIGH (ref 70–99)
Glucose-Capillary: 207 mg/dL — ABNORMAL HIGH (ref 70–99)
Glucose-Capillary: 211 mg/dL — ABNORMAL HIGH (ref 70–99)
Glucose-Capillary: 220 mg/dL — ABNORMAL HIGH (ref 70–99)
Glucose-Capillary: 262 mg/dL — ABNORMAL HIGH (ref 70–99)
Glucose-Capillary: 306 mg/dL — ABNORMAL HIGH (ref 70–99)
Glucose-Capillary: 325 mg/dL — ABNORMAL HIGH (ref 70–99)
Glucose-Capillary: 386 mg/dL — ABNORMAL HIGH (ref 70–99)
Glucose-Capillary: 388 mg/dL — ABNORMAL HIGH (ref 70–99)
Glucose-Capillary: 422 mg/dL — ABNORMAL HIGH (ref 70–99)

## 2023-05-11 LAB — BASIC METABOLIC PANEL
Anion gap: 13 (ref 5–15)
Anion gap: 10 (ref 5–15)
BUN: 32 mg/dL — ABNORMAL HIGH (ref 8–23)
BUN: 41 mg/dL — ABNORMAL HIGH (ref 8–23)
CO2: 25 mmol/L (ref 22–32)
CO2: 30 mmol/L (ref 22–32)
Calcium: 8.9 mg/dL (ref 8.9–10.3)
Calcium: 9.5 mg/dL (ref 8.9–10.3)
Chloride: 100 mmol/L (ref 98–111)
Chloride: 102 mmol/L (ref 98–111)
Creatinine, Ser: 1.45 mg/dL — ABNORMAL HIGH (ref 0.61–1.24)
Creatinine, Ser: 1.79 mg/dL — ABNORMAL HIGH (ref 0.61–1.24)
GFR, Estimated: 51 mL/min — ABNORMAL LOW (ref >60)
GFR, Estimated: 40 mL/min — ABNORMAL LOW (ref >60)
Glucose, Bld: 408 mg/dL — ABNORMAL HIGH (ref 70–99)
Glucose, Bld: 177 mg/dL — ABNORMAL HIGH (ref 70–99)
Potassium: 5.7 mmol/L — ABNORMAL HIGH (ref 3.5–5.1)
Potassium: 4.6 mmol/L (ref 3.5–5.1)
Sodium: 138 mmol/L (ref 135–145)
Sodium: 142 mmol/L (ref 135–145)

## 2023-05-11 LAB — LACTIC ACID, PLASMA
Lactic Acid, Venous: 0.7 mmol/L (ref 0.5–1.9)
Lactic Acid, Venous: 1.8 mmol/L (ref 0.5–1.9)

## 2023-05-11 LAB — COMPREHENSIVE METABOLIC PANEL
ALT: 19 U/L (ref 0–44)
AST: 19 U/L (ref 15–41)
Albumin: 2.9 g/dL — ABNORMAL LOW (ref 3.5–5.0)
Alkaline Phosphatase: 86 U/L (ref 38–126)
Anion gap: 10 (ref 5–15)
BUN: 23 mg/dL (ref 8–23)
CO2: 31 mmol/L (ref 22–32)
Calcium: 9.3 mg/dL (ref 8.9–10.3)
Chloride: 99 mmol/L (ref 98–111)
Creatinine, Ser: 1.39 mg/dL — ABNORMAL HIGH (ref 0.61–1.24)
GFR, Estimated: 54 mL/min — ABNORMAL LOW (ref >60)
Glucose, Bld: 233 mg/dL — ABNORMAL HIGH (ref 70–99)
Potassium: 4.7 mmol/L (ref 3.5–5.1)
Sodium: 140 mmol/L (ref 135–145)
Total Bilirubin: 0.4 mg/dL (ref 0.3–1.2)
Total Protein: 7.3 g/dL (ref 6.5–8.1)

## 2023-05-11 LAB — MAGNESIUM: Magnesium: 2.6 mg/dL — ABNORMAL HIGH (ref 1.7–2.4)

## 2023-05-11 LAB — ECHOCARDIOGRAM COMPLETE
AV Mean grad: 5 mmHg
AV Peak grad: 9.5 mmHg
Ao pk vel: 1.54 m/s
Area-P 1/2: 2.76 cm2
Height: 68 in
S' Lateral: 3.9 cm
Weight: 4236.36 oz

## 2023-05-11 LAB — TROPONIN I (HIGH SENSITIVITY)
Troponin I (High Sensitivity): 15 ng/L (ref <18)
Troponin I (High Sensitivity): 13 ng/L (ref <18)

## 2023-05-11 LAB — PROTIME-INR
INR: 1.2 (ref 0.8–1.2)
INR: 1.2 (ref 0.8–1.2)
Prothrombin Time: 15.3 seconds — ABNORMAL HIGH (ref 11.4–15.2)
Prothrombin Time: 15.5 seconds — ABNORMAL HIGH (ref 11.4–15.2)

## 2023-05-11 LAB — CBC
HCT: 44.1 % (ref 39.0–52.0)
Hemoglobin: 13.1 g/dL (ref 13.0–17.0)
MCH: 29.6 pg (ref 26.0–34.0)
MCHC: 29.7 g/dL — ABNORMAL LOW (ref 30.0–36.0)
MCV: 99.5 fL (ref 80.0–100.0)
Platelets: 163 10*3/uL (ref 150–400)
RBC: 4.43 MIL/uL (ref 4.22–5.81)
RDW: 13.7 % (ref 11.5–15.5)
WBC: 4.2 10*3/uL (ref 4.0–10.5)
nRBC: 0 % (ref 0.0–0.2)

## 2023-05-11 LAB — CULTURE, BLOOD (ROUTINE X 2): Special Requests: ADEQUATE

## 2023-05-11 LAB — APTT
aPTT: 29 seconds (ref 24–36)
aPTT: 31 seconds (ref 24–36)

## 2023-05-11 LAB — CBG MONITORING, ED
Glucose-Capillary: 439 mg/dL — ABNORMAL HIGH (ref 70–99)
Glucose-Capillary: 399 mg/dL — ABNORMAL HIGH (ref 70–99)

## 2023-05-11 LAB — HEMOGLOBIN A1C
Hgb A1c MFr Bld: 8.6 % — ABNORMAL HIGH (ref 4.8–5.6)
Mean Plasma Glucose: 200.12 mg/dL

## 2023-05-11 LAB — MRSA NEXT GEN BY PCR, NASAL: MRSA by PCR Next Gen: NOT DETECTED

## 2023-05-11 LAB — BRAIN NATRIURETIC PEPTIDE: B Natriuretic Peptide: 32.9 pg/mL (ref 0.0–100.0)

## 2023-05-11 LAB — PHOSPHORUS: Phosphorus: 2.6 mg/dL (ref 2.5–4.6)

## 2023-05-11 LAB — PROCALCITONIN: Procalcitonin: 0.17 ng/mL

## 2023-05-11 MED ORDER — PERFLUTREN LIPID MICROSPHERE
1.0000 mL | INTRAVENOUS | Status: AC | PRN
  Administered 2023-05-11: 3 mL via INTRAVENOUS

## 2023-05-11 MED ORDER — FENTANYL CITRATE PF 50 MCG/ML IJ SOSY: 50.0000 ug | PREFILLED_SYRINGE | Freq: Once | INTRAMUSCULAR | Status: AC

## 2023-05-11 MED ORDER — ROCURONIUM BROMIDE 50 MG/5ML IV SOLN
INTRAVENOUS | Status: AC | PRN
  Administered 2023-05-11: 100 mg via INTRAVENOUS

## 2023-05-11 MED ORDER — DEXTROSE 50 % IV SOLN: 0.0000 mL | INTRAVENOUS | Status: DC | PRN

## 2023-05-11 MED ORDER — SODIUM CHLORIDE 0.9 % IV SOLN: INTRAVENOUS | Status: DC

## 2023-05-11 MED ORDER — FENTANYL CITRATE PF 50 MCG/ML IJ SOSY
PREFILLED_SYRINGE | INTRAMUSCULAR | Status: AC
  Administered 2023-05-11: 50 ug via INTRAVENOUS
  Filled 2023-05-11: qty 1

## 2023-05-11 MED ORDER — DOCUSATE SODIUM 50 MG/5ML PO LIQD: 100.0000 mg | Freq: Two times a day (BID) | ORAL | Status: DC | PRN

## 2023-05-11 MED ORDER — HEPARIN SODIUM (PORCINE) 5000 UNIT/ML IJ SOLN
5000.0000 [IU] | Freq: Three times a day (TID) | INTRAMUSCULAR | Status: DC
  Administered 2023-05-11 – 2023-05-18 (×21): 5000 [IU] via SUBCUTANEOUS
  Filled 2023-05-11 (×21): qty 1

## 2023-05-11 MED ORDER — ORAL CARE MOUTH RINSE
15.0000 mL | OROMUCOSAL | Status: DC
  Administered 2023-05-11 – 2023-05-12 (×10): 15 mL via OROMUCOSAL

## 2023-05-11 MED ORDER — FENTANYL 2500MCG IN NS 250ML (10MCG/ML) PREMIX INFUSION
25.0000 ug/h | INTRAVENOUS | Status: DC
  Administered 2023-05-11: 25 ug/h via INTRAVENOUS
  Administered 2023-05-12: 150 ug/h via INTRAVENOUS
  Filled 2023-05-11 (×2): qty 250

## 2023-05-11 MED ORDER — INSULIN REGULAR(HUMAN) IN NACL 100-0.9 UT/100ML-% IV SOLN
INTRAVENOUS | Status: DC
  Administered 2023-05-11: 20 [IU]/h via INTRAVENOUS
  Administered 2023-05-11: 4.2 [IU]/h via INTRAVENOUS
  Administered 2023-05-11: 13 [IU]/h via INTRAVENOUS
  Filled 2023-05-11 (×3): qty 100

## 2023-05-11 MED ORDER — ROCURONIUM BROMIDE 10 MG/ML (PF) SYRINGE
PREFILLED_SYRINGE | INTRAVENOUS | Status: AC
  Filled 2023-05-11: qty 10

## 2023-05-11 MED ORDER — SODIUM CHLORIDE 0.9 % IV SOLN
500.0000 mg | INTRAVENOUS | Status: DC
  Administered 2023-05-11: 500 mg via INTRAVENOUS
  Filled 2023-05-11: qty 5

## 2023-05-11 MED ORDER — ORAL CARE MOUTH RINSE: 15.0000 mL | OROMUCOSAL | Status: DC | PRN

## 2023-05-11 MED ORDER — SODIUM CHLORIDE 0.9 % IV SOLN
250.0000 mL | INTRAVENOUS | Status: DC
  Administered 2023-05-11: 250 mL via INTRAVENOUS

## 2023-05-11 MED ORDER — ETOMIDATE 2 MG/ML IV SOLN
INTRAVENOUS | Status: AC | PRN
  Administered 2023-05-11: 20 mg via INTRAVENOUS

## 2023-05-11 MED ORDER — FUROSEMIDE 10 MG/ML IJ SOLN
40.0000 mg | Freq: Once | INTRAMUSCULAR | Status: AC
  Administered 2023-05-11: 40 mg via INTRAVENOUS
  Filled 2023-05-11: qty 4

## 2023-05-11 MED ORDER — INSULIN ASPART 100 UNIT/ML IJ SOLN
0.0000 [IU] | INTRAMUSCULAR | Status: DC
  Administered 2023-05-11: 20 [IU] via SUBCUTANEOUS

## 2023-05-11 MED ORDER — SODIUM CHLORIDE 0.9 % IV SOLN
2.0000 g | INTRAVENOUS | Status: DC
  Administered 2023-05-11 – 2023-05-12 (×2): 2 g via INTRAVENOUS
  Filled 2023-05-11: qty 20

## 2023-05-11 MED ORDER — IOHEXOL 350 MG/ML SOLN
75.0000 mL | Freq: Once | INTRAVENOUS | Status: AC | PRN
  Administered 2023-05-11: 75 mL via INTRAVENOUS

## 2023-05-11 MED ORDER — NOREPINEPHRINE 4 MG/250ML-% IV SOLN
2.0000 ug/min | INTRAVENOUS | Status: DC
  Administered 2023-05-11: 2 ug/min via INTRAVENOUS
  Filled 2023-05-11: qty 250

## 2023-05-11 MED ORDER — SODIUM CHLORIDE 0.9 % IV SOLN
500.0000 mg | Freq: Once | INTRAVENOUS | Status: AC
  Administered 2023-05-11: 500 mg via INTRAVENOUS
  Filled 2023-05-11: qty 5

## 2023-05-11 MED ORDER — PROPOFOL 1000 MG/100ML IV EMUL
INTRAVENOUS | Status: AC
  Administered 2023-05-11: 5 ug/kg/min via INTRAVENOUS
  Filled 2023-05-11: qty 100

## 2023-05-11 MED ORDER — PROPOFOL 1000 MG/100ML IV EMUL
5.0000 ug/kg/min | INTRAVENOUS | Status: DC
  Administered 2023-05-11: 5 ug/kg/min via INTRAVENOUS
  Administered 2023-05-11: 28 ug/kg/min via INTRAVENOUS
  Administered 2023-05-11: 30 ug/kg/min via INTRAVENOUS
  Administered 2023-05-12: 10 ug/kg/min via INTRAVENOUS
  Filled 2023-05-11 (×3): qty 100

## 2023-05-11 MED ORDER — SODIUM CHLORIDE 0.9 % IV SOLN: 1.0000 g | Freq: Once | INTRAVENOUS | Status: DC

## 2023-05-11 MED ORDER — IPRATROPIUM-ALBUTEROL 0.5-2.5 (3) MG/3ML IN SOLN: 3.0000 mL | RESPIRATORY_TRACT | Status: DC | PRN

## 2023-05-11 MED ORDER — FENTANYL CITRATE PF 50 MCG/ML IJ SOSY: 25.0000 ug | PREFILLED_SYRINGE | Freq: Once | INTRAMUSCULAR | Status: DC

## 2023-05-11 MED ORDER — INSULIN ASPART 100 UNIT/ML IJ SOLN
0.0000 [IU] | INTRAMUSCULAR | Status: DC
  Administered 2023-05-11: 15 [IU] via SUBCUTANEOUS

## 2023-05-11 MED ORDER — FENTANYL BOLUS VIA INFUSION
25.0000 ug | INTRAVENOUS | Status: DC | PRN
  Administered 2023-05-11: 100 ug via INTRAVENOUS
  Administered 2023-05-11 – 2023-05-12 (×5): 50 ug via INTRAVENOUS

## 2023-05-11 MED ORDER — METHYLPREDNISOLONE SODIUM SUCC 125 MG IJ SOLR
80.0000 mg | INTRAMUSCULAR | Status: DC
  Administered 2023-05-11: 80 mg via INTRAVENOUS
  Filled 2023-05-11: qty 2

## 2023-05-11 MED ORDER — INSULIN GLARGINE-YFGN 100 UNIT/ML ~~LOC~~ SOLN
18.0000 [IU] | Freq: Every day | SUBCUTANEOUS | Status: DC
  Administered 2023-05-11 – 2023-05-13 (×3): 18 [IU] via SUBCUTANEOUS
  Filled 2023-05-11 (×4): qty 0.18

## 2023-05-11 MED ORDER — SODIUM CHLORIDE 0.9 % IV SOLN
2.0000 g | Freq: Once | INTRAVENOUS | Status: DC
  Filled 2023-05-11: qty 20

## 2023-05-11 MED ORDER — CHLORHEXIDINE GLUCONATE CLOTH 2 % EX PADS
6.0000 | MEDICATED_PAD | Freq: Every day | CUTANEOUS | Status: DC
  Administered 2023-05-11 – 2023-05-17 (×7): 6 via TOPICAL

## 2023-05-11 MED ORDER — ETOMIDATE 2 MG/ML IV SOLN
INTRAVENOUS | Status: AC
  Filled 2023-05-11: qty 10

## 2023-05-11 MED ORDER — NOREPINEPHRINE 4 MG/250ML-% IV SOLN
INTRAVENOUS | Status: AC
  Administered 2023-05-11: 5 ug/min via INTRAVENOUS
  Filled 2023-05-11: qty 250

## 2023-05-11 MED ORDER — POLYETHYLENE GLYCOL 3350 17 G PO PACK: 17.0000 g | PACK | Freq: Every day | ORAL | Status: DC | PRN

## 2023-05-11 MED ORDER — PANTOPRAZOLE SODIUM 40 MG IV SOLR
40.0000 mg | Freq: Every day | INTRAVENOUS | Status: DC
  Administered 2023-05-11 – 2023-05-12 (×2): 40 mg via INTRAVENOUS
  Filled 2023-05-11 (×2): qty 10

## 2023-05-11 MED ORDER — INSULIN ASPART 100 UNIT/ML IJ SOLN
0.0000 [IU] | INTRAMUSCULAR | Status: DC
  Administered 2023-05-11: 3 [IU] via SUBCUTANEOUS
  Administered 2023-05-12: 5 [IU] via SUBCUTANEOUS
  Administered 2023-05-12: 3 [IU] via SUBCUTANEOUS
  Administered 2023-05-12 (×2): 5 [IU] via SUBCUTANEOUS
  Administered 2023-05-12 (×2): 3 [IU] via SUBCUTANEOUS
  Administered 2023-05-13: 5 [IU] via SUBCUTANEOUS
  Administered 2023-05-13: 3 [IU] via SUBCUTANEOUS

## 2023-05-11 NOTE — ED Notes (Signed)
Chrissie Noa, [place] notified of blood sugar level. Instructed to give insulin 15 units per order. Pt intubated. No acute distress noted. Vitals stable.

## 2023-05-11 NOTE — H&P (Signed)
NAME:  Ricky Johnston., MRN:  [id number], DOB:  06-Apr-1951, LOS: 0 ADMISSION DATE:  05/10/2023, CONSULTATION DATE: 05/11/2023 REFERRING MD: Emergency department physician, CHIEF COMPLAINT: Acute respiratory failure  History of Present Illness:  [name] is a 72 year old male who was recently discharged on 05/10/2023 to a skilled nursing facility and returns on 05/11/2023 with acute respiratory failure.  He has an extensive past medical history that is well-documented below and is notable for pulmonary fibrosis, chronic struct of pulmonary disease and hypoxic respiratory failure requiring oxygen on a daily basis.  On arrival to the emergency department he was intubated started on empirical antimicrobial therapy and pulmonary critical care was asked to evaluate and admit.  He required additional sedation on evaluation with subsequent drop in blood pressure which was addressed with Levophed.  He appears to be volume overloaded and receiving Lasix 2D echo has been ordered to evaluate cardiac function.  Admitted to the intensive care unit for further evaluation and treatment.  Pertinent  Medical History   Past Medical History:  Diagnosis Date   Arthritis    Cancer (HCC)    bladder cancer currently 2016   Complication of anesthesia    hard for him to wake up from Anesthesia from left nephrectomy   COPD (chronic obstructive pulmonary disease) (HCC)    Diabetes mellitus without complication (HCC)    DVT (deep venous thrombosis) (HCC) 01/08/2014, 2010   upper extremity   GERD (gastroesophageal reflux disease)    Hallux limitus 05/18/2015   from notes from [facility]    Headache    migraines   History of kidney stones    Hypercholesterolemia    Hypertension    Impaired hearing    Intervertebral disc syndrome    Kidney stones    Pulmonary fibrosis (HCC)    Renal calculi 01/08/2014   frrom noted from [facility] .in chart   Restless legs 01/08/2014   Sciatic leg pain    paralysis of sciatic  nerve   Sleep apnea    CPAP/BIPAP   Tinnitus      Significant Hospital Events: Including procedures, antibiotic start and stop dates in addition to other pertinent events   05/11/2023 intubated per EDP  Interim History / Subjective:  Intubated in the emergency department for respiratory distress  Objective   Blood pressure (!) 91/58, pulse 70, temperature 99.7 F (37.6 C), resp. rate (!) 28, height 5\' 8"  (1.727 m), weight 119 kg, SpO2 93 %.    Vent Mode: PRVC FiO2 (%):  [40 %-100 %] 40 % Set Rate:  [20 bmp-28 bmp] 28 bmp Vt Set:  [550 mL] 550 mL PEEP:  [8 cmH20] 8 cmH20 Plateau Pressure:  [20 cmH20] 20 cmH20   Intake/Output Summary (Last 24 hours) at 05/11/2023 4098 Last data filed at 05/11/2023 0800 Gross per 24 hour  Intake 451.07 ml  Output --  Net 451.07 ml   Filed Weights   05/11/23 0002  Weight: 119 kg    Examination: General: Morbidly obese male is on full mechanical ventilatory support was bucking ventilator and attempting to remove tube on first examination HENT: Tracheal tube is in place no JVD Lungs: Coarse rhonchi bilaterally copious oral secretions Cardiovascular: Heart sounds are distant Abdomen: Obese distended Extremities: Bilateral lower extremity edema Neuro: Dated with propofol and fentanyl GU: Scrotal area noticed to be inflamed Foley catheter in place  Resolved Hospital Problem list     Assessment & Plan:  Acute on chronic hypoxic respiratory failure in  the setting of chronic obstructive pulmonary disease, suspected volume overload congestive heart failure possibility of pneumonia. Intubated 05/11/2023 emergency department Empirical antimicrobial therapy with Rocephin and Zithromax Bronchodilators Sedation protocol with propofol and fentanyl Check sputum culture Check blood cultures Vent bundle ABG ordered after ventilator adjustments made  Hypertension in the setting of sedation Low-dose peripheral IV Levophed  Suspected heart failure  no 2D echo available in the system KVO IV fluids Diuresis 2D echo has been ordered Maintain negative intake and output  Diabetes mellitus CBG (last 3)  Recent Labs    05/10/23 1152 05/11/23 0642 05/11/23 0812  GLUCAP 262* 439* 399*   Noted to have received 1 dose of Solu-Medrol by EMS Placed on every 4 hours sliding-scale insulin protocol Check hemoglobin A1c  History of bladder cancer Place Foley catheter  Multiple comorbidities may need end-of-life discussion in the near future.    Best Practice (right click and "Reselect all SmartList Selections" daily)   Diet/type: NPO DVT prophylaxis: prophylactic heparin  GI prophylaxis: PPI Lines: N/A Foley:  N/A Code Status:  full code Last date of multidisciplinary goals of care discussion [TBD] No family at bedside Labs   CBC: Recent Labs  Lab 05/06/23 1845 05/07/23 0322 05/08/23 0418 05/10/23 0406 05/11/23 0015 05/11/23 0022 05/11/23 0322 05/11/23 1610 05/11/23 0745  WBC 4.5 4.8 4.5 4.3 5.6  --   --   --   --   NEUTROABS 3.1  --  2.9  --  3.7  --   --   --   --   HGB 13.0 12.5* 12.0* 12.7* 14.1 15.0 15.0 15.0 14.3  HCT 41.9 42.1 40.6 43.3 47.8 44.0 44.0 44.0 42.0  MCV 95.9 99.5 99.0 99.8 100.6*  --   --   --   --   PLT 154 139* 138* 135* 143*  --   --   --   --     Basic Metabolic Panel: Recent Labs  Lab 05/07/23 0322 05/08/23 0418 05/10/23 0406 05/11/23 0015 05/11/23 0022 05/11/23 0322 05/11/23 0638 05/11/23 0653 05/11/23 0745  NA 142 140 141 140 141 138 139 138 138  K 4.2 4.2 4.4 4.7 4.7 5.8* 5.5* 5.7* 5.3*  CL 104 104 101 99  --   --   --  100  --   CO2 30 29 31 31   --   --   --  25  --   GLUCOSE 237* 84 110* 233*  --   --   --  408*  --   BUN 22 17 20 23   --   --   --  32*  --   CREATININE 1.34* 1.11 1.13 1.39*  --   --   --  1.45*  --   CALCIUM 7.7* 8.1* 8.8* 9.3  --   --   --  8.9  --    GFR: Estimated Creatinine Clearance: 57.7 mL/min (A) (by C-G formula based on SCr of 1.45 mg/dL  (H)). Recent Labs  Lab 05/06/23 1943 05/06/23 2138 05/07/23 0322 05/08/23 0418 05/10/23 0406 05/11/23 0015 05/11/23 0653  WBC  --   --  4.8 4.5 4.3 5.6  --   LATICACIDVEN 2.4* 1.6  --   --   --  0.7 1.8    Liver Function Tests: Recent Labs  Lab 05/06/23 1845 05/11/23 0015  AST 15 19  ALT 13 19  ALKPHOS 76 86  BILITOT 0.7 0.4  PROT 6.6 7.3  ALBUMIN 3.1* 2.9*   No results  for input(s): "LIPASE", "AMYLASE" in the last 168 hours. No results for input(s): "AMMONIA" in the last 168 hours.  ABG    Component Value Date/Time   PHART 7.372 05/11/2023 0745   PCO2ART 54.6 (H) 05/11/2023 0745   PO2ART 209 (H) 05/11/2023 0745   HCO3 31.6 (H) 05/11/2023 0745   TCO2 33 (H) 05/11/2023 0745   ACIDBASEDEF 0.8 02/10/2017 1740   O2SAT 100 05/11/2023 0745     Coagulation Profile: Recent Labs  Lab 05/06/23 1943 05/11/23 0015  INR 1.1 1.2    Cardiac Enzymes: No results for input(s): "CKTOTAL", "CKMB", "CKMBINDEX", "TROPONINI" in the last 168 hours.  HbA1C: Hgb A1c MFr Bld  Date/Time Value Ref Range Status  05/06/2023 06:49 PM 8.6 (H) 4.8 - 5.6 % Final    Comment:    (NOTE) Pre diabetes:          5.7%-6.4%  Diabetes:              >6.4%  Glycemic control for   <7.0% adults with diabetes   01/08/2015 07:08 PM 8.1 (H) 4.8 - 5.6 % Final    Comment:    (NOTE)         Pre-diabetes: 5.7 - 6.4         Diabetes: >6.4         Glycemic control for adults with diabetes: <7.0     CBG: Recent Labs  Lab 05/09/23 1631 05/09/23 2056 05/10/23 0734 05/10/23 1152 05/11/23 0642  GLUCAP 131* 100* 142* 262* 439*    Review of Systems:   na  Past Medical History:  He,  has a past medical history of Arthritis, Cancer (HCC), Complication of anesthesia, COPD (chronic obstructive pulmonary disease) (HCC), Diabetes mellitus without complication (HCC), DVT (deep venous thrombosis) (HCC) (01/08/2014, 2010), GERD (gastroesophageal reflux disease), Hallux limitus (05/18/2015), Headache,  History of kidney stones, Hypercholesterolemia, Hypertension, Impaired hearing, Intervertebral disc syndrome, Kidney stones, Pulmonary fibrosis (HCC), Renal calculi (01/08/2014), Restless legs (01/08/2014), Sciatic leg pain, Sleep apnea, and Tinnitus.   Surgical History:   Past Surgical History:  Procedure Laterality Date   ABDOMINAL SURGERY     BACK SURGERY     BLADDER REPAIR     EYE SURGERY     HERNIA REPAIR     LEFT HEART CATHETERIZATION WITH CORONARY ANGIOGRAM N/A 01/09/2015   Procedure: LEFT HEART CATHETERIZATION WITH CORONARY ANGIOGRAM;  Surgeon: Robynn Pane, MD;  Location: [facility] CATH LAB;  Service: Cardiovascular;  Laterality: N/A;   NEPHROURETERECTOMY  03/18/2016   with bladder cuff excision- from noted in chart from [facility]   TOTAL HIP ARTHROPLASTY Left 02/10/2017   Procedure: LEFT TOTAL HIP ARTHROPLASTY ANTERIOR APPROACH;  Surgeon: Kathryne Hitch, MD;  Location: [facility] ORS;  Service: Orthopedics;  Laterality: Left;     Social History:   reports that he quit smoking about 23 years ago. His smoking use included cigarettes. He has a 33.00 pack-year smoking history. He has never used smokeless tobacco. He reports that he does not currently use alcohol. He reports that he does not use drugs.   Family History:  His family history is not on file.   Allergies Allergies  Allergen Reactions   Poison Ivy Extract [Poison Ivy Extract] Rash   Poison Oak Extract Rash     Home Medications  Prior to Admission medications   Medication Sig Start Date End Date Taking? Authorizing Provider  albuterol (PROVENTIL HFA;VENTOLIN HFA) 108 (90 Base) MCG/ACT inhaler Inhale 2 puffs into the lungs every 6 (  six) hours as needed for wheezing or shortness of breath.   Yes [provider]  aspirin 81 MG chewable tablet Chew 1 tablet (81 mg total) by mouth 2 (two) times daily after a meal. Patient taking differently: Chew 81 mg by mouth in the morning and at bedtime. 02/14/17  Yes Kathryne Hitch, MD  atorvastatin (LIPITOR) 80 MG tablet Take 80 mg by mouth at bedtime.   Yes [provider]  cholecalciferol (VITAMIN D) 1000 UNITS tablet Take 1,000 Units by mouth daily.   Yes [provider]  empagliflozin (JARDIANCE) 25 MG TABS tablet Take 25 mg by mouth daily.   Yes [provider]  ferrous sulfate 325 (65 FE) MG tablet Take 325 mg by mouth daily.   Yes [provider]  fluocinonide (LIDEX) 0.05 % external solution Apply 1 Application topically daily as needed (itchy rash on scalp).   Yes [provider]  fluticasone (FLONASE) 50 MCG/ACT nasal spray Place 1-2 sprays into both nostrils in the morning. 07/25/18  Yes [provider]  furosemide (LASIX) 20 MG tablet Take 20 mg by mouth in the morning.   Yes [provider]  gabapentin (NEURONTIN) 300 MG capsule Take 600 mg by mouth 3 (three) times daily.   Yes [provider]  HUMULIN R U-500 KWIKPEN 500 UNIT/ML kwikpen Inject 30-60 Units into the skin See admin instructions. Inject 30 units under the skin in the morning, 60 units in the afternoon and 60 units in the evening 09/14/18  Yes [provider]  hydrocortisone 2.5 % cream Apply 1 Application topically 2 (two) times daily as needed (rash on face).   Yes [provider]  ketoconazole (NIZORAL) 2 % shampoo Apply 1 Application topically every Monday, Wednesday, and Friday.   Yes [provider]  loratadine (ALAVERT) 10 MG dissolvable tablet Take 10 mg by mouth daily as needed for allergies.   Yes [provider]  rOPINIRole (REQUIP) 0.5 MG tablet Take 0.5 mg by mouth at bedtime. 05/26/21 05/10/24 Yes [provider]  rOPINIRole (REQUIP) 1 MG tablet Take 1 tablet by mouth at bedtime. 09/25/20 07/08/23 Yes [provider]  tamsulosin (FLOMAX) 0.4 MG CAPS capsule Take 0.4 mg by mouth at bedtime.   Yes [provider]  Tiotropium Bromide Monohydrate  (SPIRIVA RESPIMAT) 2.5 MCG/ACT AERS Inhale 2 each into the lungs daily as needed (wheezing, shortness of breath.).   Yes [provider]  Fluticasone-Salmeterol (ADVAIR) 250-50 MCG/DOSE AEPB Inhale 1 puff into the lungs daily. Patient not taking: Reported on 05/11/2023 08/24/20   [provider]  OXYGEN Inhale 2-3 L/min into the lungs See admin instructions. 2 L/min continuous. May increase to 3 L/min as needed for shortness of breath.    [provider]  Tirzepatide [facility]) Inject 1 Dose into the skin every Sunday. Patient not taking: Reported on 05/11/2023    [provider]     Critical care time: 45 min    Brett Canales Thyra Yinger ACNP Acute Care Nurse Practitioner [facility] Pulmonary/Critical Care Please consult Amion 05/11/2023, 8:11 AM

## 2023-05-11 NOTE — TOC Progression Note (Signed)
Transition of Care (TOC) - Initial/Assessment Note    Patient Details  Name: Ricky BORGHI Sr. MRN: [id number] Date of Birth: 1951/09/05  Transition of [facility]) CM/SW Contact:    Ralene Bathe, LCSW Phone Number: 05/11/2023, 1:46 PM  Clinical Narrative:                 Transition of Care [facility]) - Inpatient Brief Assessment   Patient Details  Name: Ricky SCALLON Sr. MRN: [id number] Date of Birth: 15-Mar-1951  Transition of Care [facility]) CM/SW Contact:    Ralene Bathe, LCSW Phone Number: 05/11/2023, 1:46 PM   Transition of Care Asessment: Insurance and Status: Insurance coverage has been reviewed Patient has primary care physician: Yes Home environment has been reviewed: Was receiving ST[facility]hab at [facility] prior to admission Prior level of function:: unknown Prior/Current Home Services: No current home services Social Determinants of Health Reivew: SDOH reviewed no interventions necessary Readmission risk has been reviewed: Yes Transition of care needs: transition of care needs identified, TOC will continue to follow        Patient Goals and CMS Choice            Expected Discharge Plan and Services                                              Prior Living Arrangements/Services                       Activities of Daily Living      Permission Sought/Granted                  Emotional Assessment              Admission diagnosis:  Acute respiratory failure with hypoxia and hypercapnia (HCC) [J96.01, J96.02] Acute hypoxic respiratory failure (HCC) [J96.01] Patient Active Problem List   Diagnosis Date Noted   Acute on chronic respiratory failure with hypoxia and hypercapnia (HCC) 05/11/2023   Acute hypoxic respiratory failure (HCC) 05/11/2023   Class 2 obesity 05/07/2023   CKD stage 3a, GFR 45-59 ml/min (HCC) 05/07/2023   Syncope 05/06/2023   Impaired functional mobility, balance, gait, and endurance 09/27/2018    Unilateral primary osteoarthritis, left hip 02/10/2017   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   H/O total hip arthroplasty, left 02/10/2017   History of total hip arthroplasty 02/10/2017   Respiratory failure (HCC)    OSA (obstructive sleep apnea)    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   HTN (hypertension) 10/13/2016   Anemia 03/02/2016   BPH (benign prostatic hyperplasia) 03/02/2016   Type 2 diabetes mellitus with hyperlipidemia (HCC) 03/02/2016   Migraines 03/02/2016   OAB (overactive bladder) 03/02/2016   CAD (coronary artery disease) 02/09/2015   Morbid obesity with BMI of 40.0-44.9, adult (HCC) 01/29/2015   Bladder cancer (HCC) 01/29/2015   Pain in the chest    Chronic systolic heart failure (HCC)    Esophageal reflux    Chest pain 01/08/2015   Insulin dependent diabetes mellitus 01/08/2015   COPD (chronic obstructive pulmonary disease) (HCC) 01/08/2015   Hyperlipidemia associated with type 2 diabetes mellitus (HCC) 01/08/2015   PCP:  [name], MD Pharmacy:   [facility] [place], [place] - [address] [place] [address] Phone: [phone] Fax: [phone]  [facility] - [place], [place] - [address] [place] [address] Phone: [phone] Fax: [phone]     Social Determinants of Health (SDOH) Social History: SDOH Screenings   Food Insecurity: No Food Insecurity (05/06/2023)  Housing: Low Risk  (05/06/2023)  Transportation Needs: No Transportation Needs (05/06/2023)  Utilities: Patient Declined (05/07/2023)  Depression (PHQ2-9): Low Risk  (11/08/2021)  Tobacco Use: Medium Risk (05/06/2023)   SDOH Interventions:     Readmission Risk Interventions     No data to display

## 2023-05-11 NOTE — ED Provider Notes (Signed)
[facility] AT [facility] Provider Note   CSN: [id number] Arrival date & time: 05/10/23  2355     History  Chief Complaint  Patient presents with   Respiratory Distress    Patient to ED via EMS from [facility] assisted living with complaint of difficulty breathing. EMS reports patient was 84% on 3 liters. Patient uses 3 liters nasal canula O2 at baseline. EMS gave 125 mg solu medrol and 2 grams magnesium sulfate through and 18 in left bicep. Patient arrives with duo neb treatment being administered.    HYMEN ARNETT Sr. is a 72 y.o. male.  HPI   Patient with medical history including upper extremity DVT, bladder cancer, COPD, diabetes, CHF, chronic respiratory failure on 3 L via nasal cannula presents as acute respiratory failure.  Difficult to obtain HPI due to acuity of condition but patient states that he is not feeling well, has worsening cough and congestion, states he feels some chest tightness, no associate fevers chills.  He denies stomach pains nausea vomiting diarrhea.  Reviewed patient's chart was admitted on 06/08 discharged on the 12th for syncope believe etiology is amatory dysfunction with weakness, he also was volume overloaded and was diuresed.  Patient was discharged to SNF.  There was concern for possible pneumonia and he was started on 1 dose of azithromycin and ceftriaxone.  Outpatient echocardiogram shows EF of 50 to 55%.  Home Medications Prior to Admission medications   Medication Sig Start Date End Date Taking? Authorizing Provider  albuterol (PROVENTIL HFA;VENTOLIN HFA) 108 (90 Base) MCG/ACT inhaler Inhale 2 puffs into the lungs every 6 (six) hours as needed for wheezing or shortness of breath.   Yes [provider]  aspirin 81 MG chewable tablet Chew 1 tablet (81 mg total) by mouth 2 (two) times daily after a meal. Patient taking differently: Chew 81 mg by mouth in the morning and at bedtime. 02/14/17  Yes Kathryne Hitch, MD  atorvastatin (LIPITOR) 80 MG tablet Take 80 mg by mouth at bedtime.   Yes [provider]  cholecalciferol (VITAMIN D) 1000 UNITS tablet Take 1,000 Units by mouth daily.   Yes [provider]  empagliflozin (JARDIANCE) 25 MG TABS tablet Take 25 mg by mouth daily.   Yes [provider]  ferrous sulfate 325 (65 FE) MG tablet Take 325 mg by mouth daily.   Yes [provider]  fluocinonide (LIDEX) 0.05 % external solution Apply 1 Application topically daily as needed (itchy rash on scalp).   Yes [provider]  fluticasone (FLONASE) 50 MCG/ACT nasal spray Place 1-2 sprays into both nostrils in the morning. 07/25/18  Yes [provider]  furosemide (LASIX) 20 MG tablet Take 20 mg by mouth in the morning.   Yes [provider]  gabapentin (NEURONTIN) 300 MG capsule Take 600 mg by mouth 3 (three) times daily.   Yes [provider]  HUMULIN R U-500 KWIKPEN 500 UNIT/ML kwikpen Inject 30-60 Units into the skin See admin instructions. Inject 30 units under the skin in the morning, 60 units in the afternoon and 60 units in the evening 09/14/18  Yes [provider]  hydrocortisone 2.5 % cream Apply 1 Application topically 2 (two) times daily as needed (rash on face).   Yes [provider]  ketoconazole (NIZORAL) 2 % shampoo Apply 1 Application topically every Monday, Wednesday, and Friday.   Yes [provider]  loratadine (ALAVERT) 10 MG dissolvable tablet Take 10 mg  by mouth daily as needed for allergies.   Yes [provider]  rOPINIRole (REQUIP) 0.5 MG tablet Take 0.5 mg by mouth at bedtime. 05/26/21 05/10/24 Yes [provider]  rOPINIRole (REQUIP) 1 MG tablet Take 1 tablet by mouth at bedtime. 09/25/20 07/08/23 Yes [provider]  tamsulosin (FLOMAX) 0.4 MG CAPS capsule Take 0.4 mg by mouth at bedtime.   Yes [provider]  Tiotropium Bromide Monohydrate  (SPIRIVA RESPIMAT) 2.5 MCG/ACT AERS Inhale 2 each into the lungs daily as needed (wheezing, shortness of breath.).   Yes [provider]  Fluticasone-Salmeterol (ADVAIR) 250-50 MCG/DOSE AEPB Inhale 1 puff into the lungs daily. Patient not taking: Reported on 05/11/2023 08/24/20   [provider]  OXYGEN Inhale 2-3 L/min into the lungs See admin instructions. 2 L/min continuous. May increase to 3 L/min as needed for shortness of breath.    [provider]  Tirzepatide [facility]) Inject 1 Dose into the skin every Sunday. Patient not taking: Reported on 05/11/2023    [provider]      Allergies    Poison ivy extract [poison ivy extract] and Poison oak extract    Review of Systems   Review of Systems  Unable to perform ROS: Severe respiratory distress    Physical Exam Updated Vital Signs BP 114/68   Pulse (!) 59   Temp 99.2 F (37.3 C)   Resp (!) 25   Ht 5\' 8"  (1.727 m)   Wt 119 kg   SpO2 95%   BMI 39.89 kg/m  Physical Exam Vitals and nursing note reviewed.  Constitutional:      General: He is in acute distress.     Appearance: He is ill-appearing and toxic-appearing.  HENT:     Head: Normocephalic and atraumatic.     Nose: No congestion.     Mouth/Throat:     Mouth: Mucous membranes are dry.     Pharynx: Oropharynx is clear.  Eyes:     Extraocular Movements: Extraocular movements intact.     Conjunctiva/sclera: Conjunctivae normal.     Pupils: Pupils are equal, round, and reactive to light.  Neck:     Comments: No noted JVD Cardiovascular:     Rate and Rhythm: Regular rhythm. Tachycardia present.     Pulses: Normal pulses.     Heart sounds: No murmur heard.    No friction rub. No gallop.  Pulmonary:     Effort: No respiratory distress.     Breath sounds: No wheezing, rhonchi or rales.     Comments: Presents in acute respiratory distress, only able to speak in short phrases, accessory muscle usage with noted contractions, he was  tachypneic, patient has tight sounding chest with rhonchi present no rails or wheezing present on my exam. Abdominal:     Palpations: Abdomen is soft.     Tenderness: There is no abdominal tenderness. There is no right CVA tenderness or left CVA tenderness.  Musculoskeletal:     Comments: Patient has nonpitting edema in the lower extremities no unilateral swelling no palpable cords.  Skin:    General: Skin is warm and dry.  Neurological:     Mental Status: He is alert.     Comments: Somnolent but easily arousable, no noted facial asymmetry, no slurring of his words, no unilateral weakness during my examination.  Psychiatric:        Mood and Affect: Mood normal.     ED Results / Procedures / Treatments   Labs (  all labs ordered are listed, but only abnormal results are displayed) Labs Reviewed  COMPREHENSIVE METABOLIC PANEL - Abnormal; Notable for the following components:      Result Value   Glucose, Bld 233 (*)    Creatinine, Ser 1.39 (*)    Albumin 2.9 (*)    GFR, Estimated 54 (*)    All other components within normal limits  CBC WITH DIFFERENTIAL/PLATELET - Abnormal; Notable for the following components:   MCV 100.6 (*)    MCHC 29.5 (*)    Platelets 143 (*)    All other components within normal limits  PROTIME-INR - Abnormal; Notable for the following components:   Prothrombin Time 15.3 (*)    All other components within normal limits  BASIC METABOLIC PANEL - Abnormal; Notable for the following components:   Potassium 5.7 (*)    Glucose, Bld 408 (*)    BUN 32 (*)    Creatinine, Ser 1.45 (*)    GFR, Estimated 51 (*)    All other components within normal limits  I-STAT VENOUS BLOOD GAS, ED - Abnormal; Notable for the following components:   pCO2, Ven 75.5 (*)    pO2, Ven 61 (*)    Bicarbonate 36.0 (*)    TCO2 38 (*)    Acid-Base Excess 6.0 (*)    All other components within normal limits  I-STAT VENOUS BLOOD GAS, ED - Abnormal; Notable for the following components:    pH, Ven 7.437 (*)    pO2, Ven 200 (*)    Bicarbonate 31.9 (*)    TCO2 33 (*)    Acid-Base Excess 6.0 (*)    Potassium 5.8 (*)    Calcium, Ion 1.10 (*)    All other components within normal limits  I-STAT ARTERIAL BLOOD GAS, ED - Abnormal; Notable for the following components:   pH, Arterial 7.233 (*)    pCO2 arterial 83.8 (*)    pO2, Arterial 471 (*)    Bicarbonate 35.2 (*)    TCO2 38 (*)    Acid-Base Excess 4.0 (*)    Potassium 5.5 (*)    All other components within normal limits  CBG MONITORING, ED - Abnormal; Notable for the following components:   Glucose-Capillary 439 (*)    All other components within normal limits  I-STAT ARTERIAL BLOOD GAS, ED - Abnormal; Notable for the following components:   pCO2 arterial 54.6 (*)    pO2, Arterial 209 (*)    Bicarbonate 31.6 (*)    TCO2 33 (*)    Acid-Base Excess 5.0 (*)    Potassium 5.3 (*)    All other components within normal limits  CULTURE, BLOOD (ROUTINE X 2)  CULTURE, BLOOD (ROUTINE X 2)  LACTIC ACID, PLASMA  LACTIC ACID, PLASMA  APTT  BRAIN NATRIURETIC PEPTIDE  URINALYSIS, W/ REFLEX TO CULTURE (INFECTION SUSPECTED)  BLOOD GAS, VENOUS  BLOOD GAS, VENOUS  TROPONIN I (HIGH SENSITIVITY)  TROPONIN I (HIGH SENSITIVITY)    EKG EKG Interpretation  Date/Time:  Wednesday May 10 2023 23:56:01 EDT Ventricular Rate:  104 PR Interval:  170 QRS Duration: 107 QT Interval:  333 QTC Calculation: 438 R Axis:   104 Text Interpretation: Sinus tachycardia Atrial premature complexes Right axis deviation Low voltage, precordial leads Borderline T wave abnormalities No significant change was found Confirmed by Drema Pry [phone]) on 05/11/2023 12:26:53 AM  Radiology DG Chest Portable 1 View  Result Date: 05/11/2023 CLINICAL DATA:  72 year old male status post intubation. EXAM: PORTABLE CHEST 1 VIEW COMPARISON:  Chest x-ray 05/11/2023. FINDINGS: An endotracheal tube is in place with tip 5.2 cm above the carina. A nasogastric tube  is seen extending into the stomach, however, the tip of the nasogastric tube extends below the lower margin of the image. Lung volumes are slightly low. No confluent consolidative airspace disease. There is cephalization of the pulmonary vasculature and slight indistinctness of the interstitial markings suggestive of mild pulmonary edema. No definite pleural effusions. Mild cardiomegaly. The patient is rotated to the right on today's exam, resulting in distortion of the mediastinal contours and reduced diagnostic sensitivity and specificity for mediastinal pathology. IMPRESSION: 1. Support apparatus, as above. 2. The appearance the chest suggests mild congestive heart failure, as above. Electronically Signed   By: Trudie Reed M.D.   On: 05/11/2023 06:57   CT Angio Chest PE W and/or Wo Contrast  Result Date: 05/11/2023 CLINICAL DATA:  72 year old male with altered mental status, shortness of breath difficulty breathing. EXAM: CT ANGIOGRAPHY CHEST WITH CONTRAST TECHNIQUE: Multidetector CT imaging of the chest was performed using the standard protocol during bolus administration of intravenous contrast. Multiplanar CT image reconstructions and MIPs were obtained to evaluate the vascular anatomy. RADIATION DOSE REDUCTION: This exam was performed according to the departmental dose-optimization program which includes automated exposure control, adjustment of the mA and/or kV according to patient size and/or use of iterative reconstruction technique. CONTRAST:  75mL OMNIPAQUE IOHEXOL 350 MG/ML SOLN COMPARISON:  portable chest 0006 hours today and earlier. Previous CTA chest 01/08/2015. FINDINGS: Cardiovascular: Suboptimal contrast bolus timing in the pulmonary arterial tree, substantially less than on the 2016 CTA. There is adequate contrast to exclude central, saddle, and lobar pulmonary artery thrombus. But the subsegmental and distal branches are poorly evaluated. There is underlying chronic central pulmonary  artery enlargement suggesting a degree of pulmonary artery hypertension. Calcified coronary artery atherosclerosis series 7, image 214. Mild Calcified aortic atherosclerosis. Mild to moderate cardiomegaly does not appear significantly changed from 2016. No pericardial effusion. Mediastinum/Nodes: Lungs/Pleura: Similar lung volumes to 2016 with atelectatic changes to the trachea and other major airways, but the major airways remain patent. Chronic mosaic attenuation in the lungs, most apparent in the upper lobes. Additional subpleural scarring and/or atelectasis bilaterally. No pleural effusion. No consolidation. Upper Abdomen: Mild hepatomegaly appears stable since 2016. Spleen size seems to remain within normal limits. Partially visible punctate cholelithiasis. Negative visible adrenal glands, pancreas, right kidney, and bowel in the upper abdomen. Musculoskeletal: Widespread degeneration in the spine. But largely stable appearance of the thoracic spine since 2016. Chronic left lateral 8th and 9th rib fractures. No acute or suspicious osseous lesion. Review of the MIP images confirms the above findings. IMPRESSION: 1. Relatively poor contrast bolus timing. No saddle or lobar pulmonary embolus. But the segmental and distal pulmonary branches are not evaluated. 2. Chronic central pulmonary artery enlargement suggesting pulmonary artery hypertension. And chronic pulmonary mosaic attenuation, similar to a 2016 CTA, which could reflect either chronic small vessel or small airway disease. 3. Atelectasis, but no other superimposed acute pulmonary abnormality. 4. Calcified coronary artery atherosclerosis. Mild aortic Atherosclerosis (ICD10-I70.0). Electronically Signed   By: Odessa Fleming M.D.   On: 05/11/2023 04:53   CT Head Wo Contrast  Result Date: 05/11/2023 CLINICAL DATA:  72 year old male with altered mental status, shortness of breath difficulty breathing. EXAM: CT HEAD WITHOUT CONTRAST TECHNIQUE: Contiguous axial  images were obtained from the base of the skull through the vertex without intravenous contrast. RADIATION DOSE REDUCTION: This exam was performed according to the  departmental dose-optimization program which includes automated exposure control, adjustment of the mA and/or kV according to patient size and/or use of iterative reconstruction technique. COMPARISON:  None Available. FINDINGS: Brain: No midline shift, ventriculomegaly, mass effect, evidence of mass lesion, intracranial hemorrhage or evidence of cortically based acute infarction. Cerebral volume is within normal limits for age. Mild to moderate patchy bilateral cerebral white matter hypodensity, but maintained gray-white differentiation throughout. No cortical encephalomalacia. Deep gray nuclei, brainstem and cerebellum remain within normal limits. Vascular: No suspicious intracranial vascular hyperdensity. Mild Calcified atherosclerosis at the skull base. Skull: Negative. Sinuses/Orbits: Visualized paranasal sinuses and mastoids are well aerated. Other: No acute orbit or scalp soft tissue finding. Left parotid sialolithiasis partially visible. IMPRESSION: 1. No acute intracranial abnormality. 2. Mild to moderate for age cerebral white matter changes, most commonly due to chronic small vessel disease. Electronically Signed   By: Odessa Fleming M.D.   On: 05/11/2023 04:46   DG Chest Portable 1 View  Result Date: 05/11/2023 CLINICAL DATA:  Shortness of breath. EXAM: PORTABLE CHEST 1 VIEW COMPARISON:  May 09, 2023 FINDINGS: The cardiac silhouette is mildly enlarged and unchanged in size. Mild atelectasis is seen within the bilateral lung bases. This is mildly increased in severity when compared to the prior study. No pleural effusion or pneumothorax is identified. The visualized skeletal structures are unremarkable. IMPRESSION: Mild bibasilar atelectasis. Electronically Signed   By: Aram Candela M.D.   On: 05/11/2023 00:25   DG CHEST PORT 1  VIEW  Result Date: 05/09/2023 CLINICAL DATA:  Dyspnea and respiratory abnormalities. EXAM: PORTABLE CHEST 1 VIEW COMPARISON:  Radiograph 05/07/2023 FINDINGS: Chronic shot unchanged cardiomegaly. Stable mediastinal contours. Lower lung volumes from prior exam. Vascular congestion again seen, unchanged allowing for differences in technique. Minor left lung base atelectasis. No developing consolidation. No pleural fluid or pneumothorax. IMPRESSION: 1. Lower lung volumes from prior exam. Unchanged cardiomegaly and vascular congestion. 2. Minor left lung base atelectasis. Electronically Signed   By: Narda Rutherford M.D.   On: 05/09/2023 15:23    Procedures .Critical Care  Performed by: Carroll Sage, PA-C Authorized by: Carroll Sage, PA-C   Critical care provider statement:    Critical care time (minutes):  60   Critical care time was exclusive of:  Separately billable procedures and treating other patients   Critical care was necessary to treat or prevent imminent or life-threatening deterioration of the following conditions:  Respiratory failure   Critical care was time spent personally by me on the following activities:  Development of treatment plan with patient or surrogate, discussions with consultants, evaluation of patient's response to treatment, examination of patient, ordering and review of laboratory studies, ordering and review of radiographic studies, ordering and performing treatments and interventions, pulse oximetry, re-evaluation of patient's condition and review of old charts   I assumed direction of critical care for this patient from another provider in my specialty: no     Care discussed with: admitting provider   Procedure Name: Intubation Date/Time: 05/11/2023 7:43 AM  Performed by: Carroll Sage, PA-CPre-anesthesia Checklist: Patient identified, Patient being monitored, Timeout performed, Emergency Drugs available and Suction available Oxygen Delivery  Method: Nasal cannula Preoxygenation: 95. Induction Type: IV induction Ventilation: Two handed mask ventilation required Laryngoscope Size: Mac and 4 Grade View: Grade II Tube size: 7.0 mm Number of attempts: 1 Airway Equipment and Method: Fiberoptic brochoscope and Stylet Placement Confirmation: ETT inserted through vocal cords under direct vision, CO2 detector and Breath sounds checked- equal  and bilateral Secured at: 24 cm Tube secured with: ETT holder Dental Injury: Teeth and Oropharynx as per pre-operative assessment         Medications Ordered in ED Medications  cefTRIAXone (ROCEPHIN) 2 g in sodium chloride 0.9 % 100 mL IVPB (0 g Intravenous Stopped 05/11/23 0119)  azithromycin (ZITHROMAX) 500 mg in sodium chloride 0.9 % 250 mL IVPB (has no administration in time range)  0.9 %  sodium chloride infusion ( Intravenous New Bag/Given 05/11/23 0636)  insulin aspart (novoLOG) injection 0-15 Units (15 Units Subcutaneous Given 05/11/23 0652)  propofol (DIPRIVAN) 1000 MG/100ML infusion (5 mcg/kg/min  119 kg Intravenous New Bag/Given 05/11/23 0633)  azithromycin (ZITHROMAX) 500 mg in sodium chloride 0.9 % 250 mL IVPB (0 mg Intravenous Stopped 05/11/23 0219)  iohexol (OMNIPAQUE) 350 MG/ML injection 75 mL (75 mLs Intravenous Contrast Given 05/11/23 0418)  etomidate (AMIDATE) injection ( Intravenous Canceled Entry 05/11/23 0630)  rocuronium (ZEMURON) injection ( Intravenous Canceled Entry 05/11/23 0630)    ED Course/ Medical Decision Making/ A&P Clinical Course as of 05/11/23 0750  Thu May 11, 2023  0615 Called into the room for somnolence. Patient respiratory drive decreased and difficult to arouse. Will intubate for airway protection [PC]    Clinical Course User Index [PC] Cardama, Amadeo Garnet, MD                             Medical Decision Making Amount and/or Complexity of Data Reviewed Labs: ordered. Radiology: ordered. ECG/medicine tests: ordered.  Risk Prescription drug  management. Decision regarding hospitalization.   This patient presents to the ED for concern of respiratory failure, this involves an extensive number of treatment options, and is a complaint that carries with it a high risk of complications and morbidity.  The differential diagnosis includes CHF, COPD, PE, ACS, pneumonia    Additional history obtained:  Additional history obtained from wife at bedside External records from outside source obtained and reviewed including previous hospitalization   Co morbidities that complicate the patient evaluation  COPD, chronic respiratory failure on 3 L via nasal cannula, CHF,  Social Determinants of Health:  N/A    Lab Tests:  I Ordered, and personally interpreted labs.  The pertinent results include: CBC is unremarkable, CMP reveals glucose of 233, creatinine 1.39, lactic 0.7, prothrombin time 15.3, INR 1.2 VBG reveals CO2 of 75.5 P O2 61 bicarb 36, lactic deck unremarkable   Imaging Studies ordered:  I ordered imaging studies including x-ray I independently visualized and interpreted imaging which showed basilar atelectasis, CT head, CTA chest all which were negative. I agree with the radiologist interpretation   Cardiac Monitoring:  The patient was maintained on a cardiac monitor.  I personally viewed and interpreted the cardiac monitored which showed an underlying rhythm of: without signs of ischemia   Medicines ordered and prescription drug management:  I ordered medication including antibiotics I have reviewed the patients home medicines and have made adjustments as needed  Critical Interventions:  Presents acute respiratory failure will start patient on BiPAP. Reassessed on BiPAP patient is improving we will continue to monitor Repeat VBG shows improvement, patient was taken off BiPAP is currently on high flow via nasal cannula    Reevaluation:  Patient had possible pneumonia on previous admission, patient meets  sepsis criteria, will start antibiotics and obtain sepsis lab workup.  Patient was reassessed, resting comfortably, he is much more awake and alert, will repeat VBG to determine if  patient can be taken off BiPAP.    On my reassessment patient has generalized weakness in the upper lower extremities, will obtain CT head for further evaluation.  Will also send down for CTA of chest as there is no clear explanation for acute hypoxia, possible this is just a COPD exacerbation but cannot fully exclude PE at this time.  Will  admit for acute respiratory failure with hypoxia hypercapnia likely from COPD exacerbation  Hospitalist assessed the patient patient was more somnolent, notified me of mental status change.  I went to reassess the patient,  he was somnolent, unable to maintain his airway, will intubate patient to obtain definitive airway.  Patient was successfully intubated, chest x-ray confirms placement, will admit to critical care   Consultations Obtained:  I requested consultation with the Dr. Joneen Roach,  and discussed lab and imaging findings as well as pertinent plan - they recommend: She will admit the patient. ICU has admitted the patient.    Test Considered:  N/A    Rule out I have low suspicion for ACS as history is atypical, patient has no cardiac history, EKG was sinus rhythm without signs of ischemia, patient first troponin is negative, second troponin is pending.  Doubt PE dissection AAA CT imaging is all negative these findings.  Doubt CHF exacerbation Volume overload my exam BNP is within normal limits no pleural effusion seen on chest x-ray.  I doubt low suspicion for systemic infection as patient is nontoxic-appearing, vital signs reassuring, no obvious source infection noted on exam.  Doubt CVA or intracranial head bleed no focal deficit present my exam CT imaging negative these findings.     Dispostion and problem list  After consideration of the diagnostic results  and the patients response to treatment, I feel that the patent would benefit from admission.  Respiratory failure-suspect COPD exacerbation, patient is currently intubated, will need further management by critical care.           Final Clinical Impression(s) / ED Diagnoses Final diagnoses:  Acute respiratory failure with hypoxia and hypercapnia [facility])    Rx / DC Orders ED Discharge Orders     None         Carroll Sage, PA-C 05/11/23 0750    Nira Conn, MD 05/11/23 [phone]

## 2023-05-11 NOTE — Progress Notes (Signed)
Ricky Johnston [id number] Admission Data: 05/11/2023 10:48 AM Attending Provider: Cheri Fowler, MD  WUJ:WJXB, Ricky Dixon, MD Consults/ Treatment Team:   Ricky Course Sr. is a 72 y.o. male patient admitted from ED awake, alert  & orientated  X 3,  Full Code, VSS - Blood pressure (!) 105/53, pulse 65, temperature 99.5 F (37.5 C), resp. rate 20, height 5\' 8"  (1.727 m), weight 120.1 kg, SpO2 96 %.,O2  intubated    3M08 placed and pt is currently running:normal sinus rhythm.   Skin, clean-dry- intact without evidence of bruising, or skin tears.   No evidence of skin break down noted on exam. Admitted to 3M08    Will cont to monitor and assist as needed.  Ricky Johnston, [place] 05/11/2023 10:48 AM

## 2023-05-11 NOTE — Progress Notes (Signed)
Patient transported from ED to room 3M08, on ventilator, without complications.

## 2023-05-11 NOTE — ED Notes (Signed)
Pt was incontinent of urine. Changed pt

## 2023-05-11 NOTE — ED Notes (Signed)
Patient awakening and attempted to self extubate, Verbal order for propofol  bolus and increased prop to infusion.  ALso verbal order for fentanyl .

## 2023-05-11 NOTE — Code Documentation (Signed)
Vital signs stable. 

## 2023-05-11 NOTE — Progress Notes (Addendum)
eLink Physician-Brief Progress Note Patient Name: Ricky Johnston. DOB: 1951/03/23 MRN: [id number]   Date of Service  05/11/2023  HPI/Events of Note  PT had been on an insulin drip for hyperglycemia. Glucose now 170mg /dL  eICU Interventions  Will transition to [place] glucose regimen.  Started with basal insulin of 18 units (0.15units/kg), along with moderate dose sliding scale q4hours Will follow glucose trends and make further adjustments as warranted.          Crescencio Jozwiak M DELA CRUZ 05/11/2023, 7:48 PM   2:04 AM Notified of K of 5.2 Placed order for hyperkalemia protocol with insulin and lokelma.  Maintain on continuous cardiac monitoring.  Follow serial BMP

## 2023-05-12 DIAGNOSIS — J9622 Acute and chronic respiratory failure with hypercapnia: Secondary | ICD-10-CM | POA: Diagnosis not present

## 2023-05-12 DIAGNOSIS — N179 Acute kidney failure, unspecified: Secondary | ICD-10-CM | POA: Diagnosis not present

## 2023-05-12 DIAGNOSIS — J9621 Acute and chronic respiratory failure with hypoxia: Secondary | ICD-10-CM | POA: Diagnosis not present

## 2023-05-12 LAB — GLUCOSE, CAPILLARY
Glucose-Capillary: 230 mg/dL — ABNORMAL HIGH (ref 70–99)
Glucose-Capillary: 239 mg/dL — ABNORMAL HIGH (ref 70–99)
Glucose-Capillary: 201 mg/dL — ABNORMAL HIGH (ref 70–99)
Glucose-Capillary: 178 mg/dL — ABNORMAL HIGH (ref 70–99)
Glucose-Capillary: 174 mg/dL — ABNORMAL HIGH (ref 70–99)
Glucose-Capillary: 234 mg/dL — ABNORMAL HIGH (ref 70–99)
Glucose-Capillary: 104 mg/dL — ABNORMAL HIGH (ref 70–99)
Glucose-Capillary: 162 mg/dL — ABNORMAL HIGH (ref 70–99)

## 2023-05-12 LAB — BASIC METABOLIC PANEL
Anion gap: 9 (ref 5–15)
Anion gap: 11 (ref 5–15)
BUN: 46 mg/dL — ABNORMAL HIGH (ref 8–23)
BUN: 50 mg/dL — ABNORMAL HIGH (ref 8–23)
CO2: 29 mmol/L (ref 22–32)
CO2: 27 mmol/L (ref 22–32)
Calcium: 9.3 mg/dL (ref 8.9–10.3)
Calcium: 9.1 mg/dL (ref 8.9–10.3)
Chloride: 103 mmol/L (ref 98–111)
Chloride: 102 mmol/L (ref 98–111)
Creatinine, Ser: 1.91 mg/dL — ABNORMAL HIGH (ref 0.61–1.24)
Creatinine, Ser: 2 mg/dL — ABNORMAL HIGH (ref 0.61–1.24)
GFR, Estimated: 37 mL/min — ABNORMAL LOW (ref >60)
GFR, Estimated: 35 mL/min — ABNORMAL LOW (ref >60)
Glucose, Bld: 250 mg/dL — ABNORMAL HIGH (ref 70–99)
Glucose, Bld: 257 mg/dL — ABNORMAL HIGH (ref 70–99)
Potassium: 5.2 mmol/L — ABNORMAL HIGH (ref 3.5–5.1)
Potassium: 4.7 mmol/L (ref 3.5–5.1)
Sodium: 141 mmol/L (ref 135–145)
Sodium: 140 mmol/L (ref 135–145)

## 2023-05-12 LAB — CBC
HCT: 43.4 % (ref 39.0–52.0)
Hemoglobin: 13.2 g/dL (ref 13.0–17.0)
MCH: 29.6 pg (ref 26.0–34.0)
MCHC: 30.4 g/dL (ref 30.0–36.0)
MCV: 97.3 fL (ref 80.0–100.0)
Platelets: 189 10*3/uL (ref 150–400)
RBC: 4.46 MIL/uL (ref 4.22–5.81)
RDW: 13.8 % (ref 11.5–15.5)
WBC: 8.3 10*3/uL (ref 4.0–10.5)
nRBC: 0 % (ref 0.0–0.2)

## 2023-05-12 LAB — CULTURE, BLOOD (ROUTINE X 2): Culture: NO GROWTH

## 2023-05-12 LAB — POTASSIUM: Potassium: 4.8 mmol/L (ref 3.5–5.1)

## 2023-05-12 LAB — MAGNESIUM: Magnesium: 2.5 mg/dL — ABNORMAL HIGH (ref 1.7–2.4)

## 2023-05-12 LAB — PHOSPHORUS: Phosphorus: 3.8 mg/dL (ref 2.5–4.6)

## 2023-05-12 MED ORDER — GABAPENTIN 300 MG PO CAPS
300.0000 mg | ORAL_CAPSULE | Freq: Two times a day (BID) | ORAL | Status: DC
  Administered 2023-05-12 – 2023-05-18 (×12): 300 mg via ORAL
  Filled 2023-05-12 (×12): qty 1

## 2023-05-12 MED ORDER — REVEFENACIN 175 MCG/3ML IN SOLN
175.0000 ug | Freq: Every day | RESPIRATORY_TRACT | Status: DC
  Administered 2023-05-12 – 2023-05-18 (×7): 175 ug via RESPIRATORY_TRACT
  Filled 2023-05-12 (×7): qty 3

## 2023-05-12 MED ORDER — MIDAZOLAM HCL 2 MG/2ML IJ SOLN: 1.0000 mg | INTRAMUSCULAR | Status: DC | PRN

## 2023-05-12 MED ORDER — ARFORMOTEROL TARTRATE 15 MCG/2ML IN NEBU
15.0000 ug | INHALATION_SOLUTION | Freq: Two times a day (BID) | RESPIRATORY_TRACT | Status: DC
  Administered 2023-05-12 – 2023-05-18 (×13): 15 ug via RESPIRATORY_TRACT
  Filled 2023-05-12 (×13): qty 2

## 2023-05-12 MED ORDER — INSULIN ASPART 100 UNIT/ML IV SOLN
5.0000 [IU] | Freq: Once | INTRAVENOUS | Status: AC
  Administered 2023-05-12: 5 [IU] via INTRAVENOUS

## 2023-05-12 MED ORDER — CALCIUM GLUCONATE-NACL 1-0.675 GM/50ML-% IV SOLN
1.0000 g | Freq: Once | INTRAVENOUS | Status: AC
  Administered 2023-05-12: 1000 mg via INTRAVENOUS
  Filled 2023-05-12: qty 50

## 2023-05-12 MED ORDER — GABAPENTIN 250 MG/5ML PO SOLN
300.0000 mg | Freq: Two times a day (BID) | ORAL | Status: DC
  Administered 2023-05-12: 300 mg
  Filled 2023-05-12 (×2): qty 6

## 2023-05-12 MED ORDER — PHENOL 1.4 % MT LIQD
1.0000 | OROMUCOSAL | Status: DC | PRN
  Administered 2023-05-12: 1 via OROMUCOSAL
  Filled 2023-05-12: qty 177

## 2023-05-12 MED ORDER — SODIUM ZIRCONIUM CYCLOSILICATE 10 G PO PACK
10.0000 g | PACK | Freq: Once | ORAL | Status: AC
  Administered 2023-05-12: 10 g
  Filled 2023-05-12: qty 1

## 2023-05-12 MED ORDER — BUDESONIDE 0.5 MG/2ML IN SUSP
0.5000 mg | Freq: Two times a day (BID) | RESPIRATORY_TRACT | Status: DC
  Administered 2023-05-12 – 2023-05-18 (×13): 0.5 mg via RESPIRATORY_TRACT
  Filled 2023-05-12 (×13): qty 2

## 2023-05-12 MED ORDER — ORAL CARE MOUTH RINSE: 15.0000 mL | OROMUCOSAL | Status: DC | PRN

## 2023-05-12 NOTE — Progress Notes (Signed)
Pt extubated per written order. No stridor was noted, positive cuff leak was present. Pt is on 4L [place] tolerating well. Pt was able to speak his name post extubation.

## 2023-05-12 NOTE — Progress Notes (Signed)
   05/12/23 1951  BiPAP/CPAP/SIPAP  Mask Type Nasal pillows  Mask Size Medium  Respiratory Rate 16 breaths/min  EPAP  (auto min 8 max 20)  FiO2 (%) 32 %  Flow Rate 3 lpm  Patient Home Equipment Yes  Safety Check Completed by RT for Home Unit Yes, no issues noted   Pt placed on home cpap unit with home setting noted above. No issues noted with CPAP.

## 2023-05-12 NOTE — Progress Notes (Signed)
Sputum sample collected and sent to the lab.  

## 2023-05-12 NOTE — Progress Notes (Signed)
NAME:  Ricky Johnston., MRN:  [id number], DOB:  [birth year], LOS: 1 ADMISSION DATE:  05/10/2023, CONSULTATION DATE: 05/11/2023 REFERRING MD: Emergency department physician, CHIEF COMPLAINT: Acute respiratory failure  History of Present Illness:  Ricky Johnston is a 72 year old male who was recently discharged on 05/10/2023 to a skilled nursing facility after being admitted for syncope and returns on 05/11/2023 with acute hypercarbic respiratory failure.  He failed BiPAP and required mechanical ventilation due to poor mental status PMH -COPD on 3 L home oxygen, OSA on nocturnal BiPAP, bladder cancer Presumptive initial diagnosis with COPD exacerbation versus pulmonary edema.  He required Levophed for sedation induced hypotension. CT angiogram chest was negative for PE Head CT was negative for acute stroke  Pertinent  Medical History   Past Medical History:  Diagnosis Date   Arthritis    Cancer (HCC)    bladder cancer currently 2016   Complication of anesthesia    hard for him to wake up from Anesthesia from left nephrectomy   COPD (chronic obstructive pulmonary disease) (HCC)    Diabetes mellitus without complication (HCC)    DVT (deep venous thrombosis) (HCC) 01/08/2014, 2010   upper extremity   GERD (gastroesophageal reflux disease)    Hallux limitus 05/18/2015   from notes from [facility]    Headache    migraines   History of kidney stones    Hypercholesterolemia    Hypertension    Impaired hearing    Intervertebral disc syndrome    Kidney stones    Pulmonary fibrosis (HCC)    Renal calculi 01/08/2014   frrom noted from [facility] .in chart   Restless legs 01/08/2014   Sciatic leg pain    paralysis of sciatic nerve   Sleep apnea    CPAP/BIPAP   Tinnitus      Significant Hospital Events: Including procedures, antibiotic start and stop dates in addition to other pertinent events   05/11/2023 intubated per [facility] EDP 6/13 hyperglycemic requiring insulin drip  Interim History  / Subjective:  Critically ill, intubated, on low-dose Levophed. Bradycardic to 30s on propofol and fentanyl Insulin drip was discontinued at 8 PM after giving 18 units of insulin. Hyperkalemia treated overnight with insulin and Lokelma  Objective   Blood pressure (!) 89/64, pulse (!) 37, temperature 99.9 F (37.7 C), resp. rate 20, height 5\' 8"  (1.727 m), weight 119.8 kg, SpO2 99 %.    Vent Mode: PRVC FiO2 (%):  [40 %] 40 % Set Rate:  [20 bmp] 20 bmp Vt Set:  [550 mL] 550 mL PEEP:  [5 cmH20-8 cmH20] 5 cmH20 Plateau Pressure:  [18 cmH20-20 cmH20] 18 cmH20   Intake/Output Summary (Last 24 hours) at 05/12/2023 1058 Last data filed at 05/12/2023 1000 Gross per 24 hour  Intake 1553.93 ml  Output 1277 ml  Net 276.93 ml    Filed Weights   05/11/23 0002 05/11/23 0935 05/12/23 0500  Weight: 119 kg 120.1 kg 119.8 kg    Examination: General: Morbidly obese man, on vent, bradycardic 30s HENT: Oral ET T, no JVD Lungs: Decreased breath sounds bilateral, ventilated breath sounds, no accessory muscle use Cardiovascular: S1-S2 distant Abdomen: Obese distended Extremities: 1+ Bilateral lower extremity edema Neuro: RASS -3 on propofol and fentanyl GU: Scrotal area noticed to be inflamed Foley catheter in place  Labs show hyperkalemia resolved, increasing BUN/creatinine 52/2.0, no leukocytosis, CBGs 1 90-240 range  Chest x-ray reviewed shows bilateral interstitial prominence and cardiomegaly, no effusions  Resolved Hospital Problem list  Assessment & Plan:  Acute on chronic hypoxic and hypercarbic respiratory failure COPD on 3 L home oxygen Intubated 05/11/2023 emergency department, unclear what caused worsening so soon after discharge to SNF Etiology unclear -BNP low and no overt evidence of pulmonary edema, low procalcitonin and no obvious pneumonia on CT scan No evidence of sepsis -DC antibiotics -Use budesonide/Brovana/Yupelri combination -No evidence of bronchospasm, DC  steroids -Start spontaneous breathing trials -Lighten sedation with goal RASS 0  Hypotension in the setting of sedation Bradycardia with propofol  -DC propofol -Use fentanyl drip with goal RASS 0, use Versed intermittent for breakthrough  Echo more suggestive of acute cor pulmonale with decreased RV SF rather than HFpEF -Gentle diuresis as blood pressure permits once off pressors   AKI/solitary kidney History of bladder cancer Hyperkalemia treated overnight  -Monitor urine output/BMET Hold further Lasix  Diabetes mellitus CBG (last 3)  Recent Labs    05/12/23 0403 05/12/23 0418 05/12/23 0739  GLUCAP 239* 230* 201*    -On Semglee 18 units -SSI, off insulin drip -Expect to improve with discontinuation of steroids  History of bladder cancer Place Foley catheter    Best Practice (right click and "Reselect all SmartList Selections" daily)   Diet/type: NPO DVT prophylaxis: prophylactic heparin  GI prophylaxis: PPI Lines: N/A Foley:  N/A Code Status:  full code Last date of multidisciplinary goals of care discussion [full code] Wife and daughter updated at bedside Labs   CBC: Recent Labs  Lab 05/06/23 1845 05/07/23 0322 05/08/23 0418 05/10/23 0406 05/11/23 0015 05/11/23 0022 05/11/23 0322 05/11/23 1610 05/11/23 0745 05/11/23 0820 05/12/23 0045  WBC 4.5   < > 4.5 4.3 5.6  --   --   --   --  4.2 8.3  NEUTROABS 3.1  --  2.9  --  3.7  --   --   --   --   --   --   HGB 13.0   < > 12.0* 12.7* 14.1   < > 15.0 15.0 14.3 13.1 13.2  HCT 41.9   < > 40.6 43.3 47.8   < > 44.0 44.0 42.0 44.1 43.4  MCV 95.9   < > 99.0 99.8 100.6*  --   --   --   --  99.5 97.3  PLT 154   < > 138* 135* 143*  --   --   --   --  163 189   < > = values in this interval not displayed.     Basic Metabolic Panel: Recent Labs  Lab 05/11/23 0015 05/11/23 0022 05/11/23 9604 05/11/23 0745 05/11/23 2124 05/12/23 0045 05/12/23 0301 05/12/23 0550  NA 140   < > 138 138 142 141 140  --    K 4.7   < > 5.7* 5.3* 4.6 5.2* 4.7 4.8  CL 99  --  100  --  102 103 102  --   CO2 31  --  25  --  30 29 27   --   GLUCOSE 233*  --  408*  --  177* 250* 257*  --   BUN 23  --  32*  --  41* 46* 50*  --   CREATININE 1.39*  --  1.45*  --  1.79* 1.91* 2.00*  --   CALCIUM 9.3  --  8.9  --  9.5 9.3 9.1  --   MG  --   --   --   --  2.6* 2.5*  --   --  PHOS  --   --   --   --  2.6 3.8  --   --    < > = values in this interval not displayed.    GFR: Estimated Creatinine Clearance: 42 mL/min (A) (by C-G formula based on SCr of 2 mg/dL (H)). Recent Labs  Lab 05/06/23 1943 05/06/23 2138 05/07/23 0322 05/10/23 0406 05/11/23 0015 05/11/23 0653 05/11/23 0820 05/12/23 0045  PROCALCITON  --   --   --   --   --   --  0.17  --   WBC  --   --    < > 4.3 5.6  --  4.2 8.3  LATICACIDVEN 2.4* 1.6  --   --  0.7 1.8  --   --    < > = values in this interval not displayed.     Liver Function Tests: Recent Labs  Lab 05/06/23 1845 05/11/23 0015  AST 15 19  ALT 13 19  ALKPHOS 76 86  BILITOT 0.7 0.4  PROT 6.6 7.3  ALBUMIN 3.1* 2.9*    No results for input(s): "LIPASE", "AMYLASE" in the last 168 hours. No results for input(s): "AMMONIA" in the last 168 hours.  ABG    Component Value Date/Time   PHART 7.372 05/11/2023 0745   PCO2ART 54.6 (H) 05/11/2023 0745   PO2ART 209 (H) 05/11/2023 0745   HCO3 31.6 (H) 05/11/2023 0745   TCO2 33 (H) 05/11/2023 0745   ACIDBASEDEF 0.8 02/10/2017 1740   O2SAT 100 05/11/2023 0745     Coagulation Profile: Recent Labs  Lab 05/06/23 1943 05/11/23 0015 05/11/23 0820  INR 1.1 1.2 1.2     Cardiac Enzymes: No results for input(s): "CKTOTAL", "CKMB", "CKMBINDEX", "TROPONINI" in the last 168 hours.  HbA1C: Hgb A1c MFr Bld  Date/Time Value Ref Range Status  05/11/2023 08:20 AM 8.6 (H) 4.8 - 5.6 % Final    Comment:    (NOTE) Pre diabetes:          5.7%-6.4%  Diabetes:              >6.4%  Glycemic control for   <7.0% adults with diabetes    05/06/2023 06:49 PM 8.6 (H) 4.8 - 5.6 % Final    Comment:    (NOTE) Pre diabetes:          5.7%-6.4%  Diabetes:              >6.4%  Glycemic control for   <7.0% adults with diabetes     CBG: Recent Labs  Lab 05/11/23 2239 05/11/23 2336 05/12/23 0403 05/12/23 0418 05/12/23 0739  GLUCAP 157* 197* 239* 230* 201*     Critical care time: 45 min    Cyril Mourning MD. FCCP. [facility] Pager : [phone]  If no response to pager , please call [phone] until 7 pm After 7:00 pm call Elink  [phone]    05/12/2023, 10:58 AM

## 2023-05-13 ENCOUNTER — Inpatient Hospital Stay (HOSPITAL_COMMUNITY): Payer: No Typology Code available for payment source

## 2023-05-13 DIAGNOSIS — R55 Syncope and collapse: Secondary | ICD-10-CM

## 2023-05-13 DIAGNOSIS — J9622 Acute and chronic respiratory failure with hypercapnia: Secondary | ICD-10-CM | POA: Diagnosis not present

## 2023-05-13 DIAGNOSIS — R001 Bradycardia, unspecified: Secondary | ICD-10-CM | POA: Diagnosis not present

## 2023-05-13 DIAGNOSIS — I5033 Acute on chronic diastolic (congestive) heart failure: Secondary | ICD-10-CM | POA: Diagnosis not present

## 2023-05-13 DIAGNOSIS — J9621 Acute and chronic respiratory failure with hypoxia: Secondary | ICD-10-CM | POA: Diagnosis not present

## 2023-05-13 LAB — CBC WITH DIFFERENTIAL/PLATELET
Abs Immature Granulocytes: 0.03 10*3/uL (ref 0.00–0.07)
Basophils Absolute: 0 10*3/uL (ref 0.0–0.1)
Basophils Relative: 1 %
Eosinophils Absolute: 0.1 10*3/uL (ref 0.0–0.5)
Eosinophils Relative: 2 %
HCT: 45.7 % (ref 39.0–52.0)
Hemoglobin: 13.4 g/dL (ref 13.0–17.0)
Immature Granulocytes: 1 %
Lymphocytes Relative: 17 %
Lymphs Abs: 1.1 10*3/uL (ref 0.7–4.0)
MCH: 29 pg (ref 26.0–34.0)
MCHC: 29.3 g/dL — ABNORMAL LOW (ref 30.0–36.0)
MCV: 98.9 fL (ref 80.0–100.0)
Monocytes Absolute: 0.6 10*3/uL (ref 0.1–1.0)
Monocytes Relative: 9 %
Neutro Abs: 4.6 10*3/uL (ref 1.7–7.7)
Neutrophils Relative %: 70 %
Platelets: 164 10*3/uL (ref 150–400)
RBC: 4.62 MIL/uL (ref 4.22–5.81)
RDW: 14.1 % (ref 11.5–15.5)
WBC: 6.5 10*3/uL (ref 4.0–10.5)
nRBC: 0 % (ref 0.0–0.2)

## 2023-05-13 LAB — CULTURE, BLOOD (ROUTINE X 2): Special Requests: ADEQUATE

## 2023-05-13 LAB — GLUCOSE, CAPILLARY
Glucose-Capillary: 192 mg/dL — ABNORMAL HIGH (ref 70–99)
Glucose-Capillary: 207 mg/dL — ABNORMAL HIGH (ref 70–99)
Glucose-Capillary: 194 mg/dL — ABNORMAL HIGH (ref 70–99)
Glucose-Capillary: 127 mg/dL — ABNORMAL HIGH (ref 70–99)
Glucose-Capillary: 317 mg/dL — ABNORMAL HIGH (ref 70–99)
Glucose-Capillary: 336 mg/dL — ABNORMAL HIGH (ref 70–99)

## 2023-05-13 LAB — PHOSPHORUS: Phosphorus: 3.9 mg/dL (ref 2.5–4.6)

## 2023-05-13 LAB — BASIC METABOLIC PANEL
Anion gap: 17 — ABNORMAL HIGH (ref 5–15)
BUN: 60 mg/dL — ABNORMAL HIGH (ref 8–23)
CO2: 26 mmol/L (ref 22–32)
Calcium: 9.2 mg/dL (ref 8.9–10.3)
Chloride: 94 mmol/L — ABNORMAL LOW (ref 98–111)
Creatinine, Ser: 1.74 mg/dL — ABNORMAL HIGH (ref 0.61–1.24)
GFR, Estimated: 41 mL/min — ABNORMAL LOW (ref >60)
Glucose, Bld: 209 mg/dL — ABNORMAL HIGH (ref 70–99)
Potassium: 3.8 mmol/L (ref 3.5–5.1)
Sodium: 137 mmol/L (ref 135–145)

## 2023-05-13 LAB — CALCIUM, IONIZED: Calcium, Ionized, Serum: 5.2 mg/dL (ref 4.5–5.6)

## 2023-05-13 LAB — MAGNESIUM: Magnesium: 2.4 mg/dL (ref 1.7–2.4)

## 2023-05-13 MED ORDER — POLYETHYLENE GLYCOL 3350 17 G PO PACK: 17.0000 g | PACK | Freq: Every day | ORAL | Status: DC | PRN

## 2023-05-13 MED ORDER — MELATONIN 3 MG PO TABS
3.0000 mg | ORAL_TABLET | Freq: Once | ORAL | Status: AC
  Administered 2023-05-13: 3 mg via ORAL
  Filled 2023-05-13: qty 1

## 2023-05-13 MED ORDER — DOCUSATE SODIUM 100 MG PO CAPS: 100.0000 mg | ORAL_CAPSULE | Freq: Two times a day (BID) | ORAL | Status: DC | PRN

## 2023-05-13 MED ORDER — INSULIN ASPART 100 UNIT/ML IJ SOLN
5.0000 [IU] | Freq: Three times a day (TID) | INTRAMUSCULAR | Status: DC
  Administered 2023-05-14 – 2023-05-18 (×13): 5 [IU] via SUBCUTANEOUS

## 2023-05-13 MED ORDER — INSULIN ASPART 100 UNIT/ML IJ SOLN
0.0000 [IU] | Freq: Three times a day (TID) | INTRAMUSCULAR | Status: DC
  Administered 2023-05-13: 4 [IU] via SUBCUTANEOUS
  Administered 2023-05-13: 3 [IU] via SUBCUTANEOUS
  Administered 2023-05-14: 7 [IU] via SUBCUTANEOUS
  Administered 2023-05-14: 11 [IU] via SUBCUTANEOUS
  Administered 2023-05-14: 7 [IU] via SUBCUTANEOUS
  Administered 2023-05-14: 11 [IU] via SUBCUTANEOUS
  Administered 2023-05-15: 15 [IU] via SUBCUTANEOUS
  Administered 2023-05-15 (×2): 11 [IU] via SUBCUTANEOUS
  Administered 2023-05-15: 15 [IU] via SUBCUTANEOUS
  Administered 2023-05-16: 11 [IU] via SUBCUTANEOUS
  Administered 2023-05-16: 15 [IU] via SUBCUTANEOUS
  Administered 2023-05-16: 7 [IU] via SUBCUTANEOUS
  Administered 2023-05-16: 11 [IU] via SUBCUTANEOUS
  Administered 2023-05-17: 7 [IU] via SUBCUTANEOUS
  Administered 2023-05-17 (×2): 15 [IU] via SUBCUTANEOUS
  Administered 2023-05-18: 20 [IU] via SUBCUTANEOUS

## 2023-05-13 MED ORDER — INSULIN ASPART 100 UNIT/ML IJ SOLN
0.0000 [IU] | Freq: Every day | INTRAMUSCULAR | Status: DC
  Administered 2023-05-13: 4 [IU] via SUBCUTANEOUS

## 2023-05-13 NOTE — Evaluation (Addendum)
Physical Therapy Evaluation Patient Details Name: Ricky THWAITES Sr. MRN: [id number] DOB: [birth year] Today's Date: 05/13/2023  History of Present Illness  Pt is 72 year old presented to [facility] on  05/11/23 for acute on chronic respiratory failure due to copd exacerbation and acute CHF. Pt intubated. Extubated 6/14. PMH - COPD, HTN, DM, bladder CA, UE DVT, CHF, obesity, THR  Clinical Impression  Pt admitted with above diagnosis and presents to PT with functional limitations due to deficits listed below (See PT problem list). Pt needs skilled PT to maximize independence and safety. Expect pt will make steady progress. Good motivation to work toward independence. Patient will benefit from continued inpatient follow up therapy, <3 hours/day         Recommendations for follow up therapy are one component of a multi-disciplinary discharge planning process, led by the attending physician.  Recommendations may be updated based on patient status, additional functional criteria and insurance authorization.  Follow Up Recommendations Can patient physically be transported by private vehicle: No     Assistance Recommended at Discharge Frequent or constant Supervision/Assistance  Patient can return home with the following  A lot of help with walking and/or transfers;A lot of help with bathing/dressing/bathroom;Assistance with cooking/housework;Assist for transportation;Help with stairs or ramp for entrance    Equipment Recommendations Other (comment) (To be determined at next venue)  Recommendations for Other Services       Functional Status Assessment Patient has had a recent decline in their functional status and demonstrates the ability to make significant improvements in function in a reasonable and predictable amount of time.     Precautions / Restrictions Precautions Precautions: Fall      Mobility  Bed Mobility Overal bed mobility: Needs Assistance Bed Mobility: Supine to Sit      Supine to sit: Mod assist     General bed mobility comments: Assist to bring legs off of bed, elevate trunk into sitting, and bring hips to EOB    Transfers Overall transfer level: Needs assistance Equipment used: Ambulation equipment used Transfers: Sit to/from Stand, Bed to chair/wheelchair/BSC Sit to Stand: Mod assist           General transfer comment: Assist to power up and incr time to rise Transfer via Lift Equipment: Stedy  Ambulation/Gait                  Stairs            Wheelchair Mobility    Modified Rankin (Stroke Patients Only)       Balance Overall balance assessment: Needs assistance Sitting-balance support: Bilateral upper extremity supported, Feet supported Sitting balance-Leahy Scale: Poor Sitting balance - Comments: UE support   Standing balance support: Bilateral upper extremity supported, During functional activity, Reliant on assistive device for balance Standing balance-Leahy Scale: Poor Standing balance comment: Stedy and min assist for static standing                             Pertinent Vitals/Pain Pain Assessment Pain Assessment: Faces Faces Pain Scale: Hurts little more Pain Location: Bilateral feet Pain Descriptors / Indicators: Sore Pain Intervention(s): Limited activity within patient's tolerance, Monitored during session, Repositioned    Home Living Family/patient expects to be discharged to:: Private residence Living Arrangements: Spouse/significant other Available Help at Discharge: Family;Available 24 hours/day Type of Home: House Home Access: Ramped entrance       Home Layout: One level Home Equipment: Rollator (4  wheels);Cane - single point;Shower seat;Grab bars - toilet;Grab bars - tub/shower;Other (comment)      Prior Function Prior Level of Function : Needs assist;History of Falls (last six months)       Physical Assist : Mobility (physical);ADLs (physical) Mobility (physical): Bed  mobility;Transfers;Gait   Mobility Comments: Prior to recent hospitalization pt was ambulating household distances modified independent. Since that hospitalization last week has been max assist to stand and unable to amb       Hand Dominance        Extremity/Trunk Assessment   Upper Extremity Assessment Upper Extremity Assessment: Generalized weakness    Lower Extremity Assessment Lower Extremity Assessment: Generalized weakness    Cervical / Trunk Assessment Cervical / Trunk Assessment: Kyphotic  Communication   Communication: No difficulties  Cognition Arousal/Alertness: Awake/alert Behavior During Therapy: WFL for tasks assessed/performed Overall Cognitive Status: Within Functional Limits for tasks assessed                                          General Comments      Exercises     Assessment/Plan    PT Assessment Patient needs continued PT services  PT Problem List Decreased strength;Decreased activity tolerance;Decreased balance;Decreased mobility;Obesity       PT Treatment Interventions DME instruction;Gait training;Functional mobility training;Therapeutic activities;Therapeutic exercise;Balance training;Patient/family education    PT Goals (Current goals can be found in the Care Plan section)  Acute Rehab PT Goals Patient Stated Goal: return home PT Goal Formulation: With patient Time For Goal Achievement: 05/27/23 Potential to Achieve Goals: Good    Frequency Min 3X/week     Co-evaluation               AM-PAC PT "6 Clicks" Mobility  Outcome Measure Help needed turning from your back to your side while in a flat bed without using bedrails?: A Lot Help needed moving from lying on your back to sitting on the side of a flat bed without using bedrails?: A Lot Help needed moving to and from a bed to a chair (including a wheelchair)?: Total Help needed standing up from a chair using your arms (e.g., wheelchair or bedside chair)?:  A Lot Help needed to walk in hospital room?: Total Help needed climbing 3-5 steps with a railing? : Total 6 Click Score: 9    End of Session Equipment Utilized During Treatment: Back brace;Gait belt Activity Tolerance: Patient tolerated treatment well Patient left: in chair;with call bell/phone within reach;with chair alarm set Nurse Communication: Mobility status;Need for lift equipment PT Visit Diagnosis: Unsteadiness on feet (R26.81);Other abnormalities of gait and mobility (R26.89);Muscle weakness (generalized) (M62.81)    Time: 1610-9604 PT Time Calculation (min) (ACUTE ONLY): 30 min   Charges:   PT Evaluation $PT Eval Moderate Complexity: 1 Mod PT Treatments $Therapeutic Activity: 8-22 mins        [facility] PT Acute Rehabilitation Services Office [phone]   Angelina Ok [facility] 05/13/2023, 10:41 AM

## 2023-05-13 NOTE — Progress Notes (Signed)
eLink Physician-Brief Progress Note Patient Name: Ricky Johnston DOB: December 13, 1950 MRN: [id number]   Date of Service  05/13/2023  HPI/Events of Note  here are 2 different SSI that are covering bedtime, [facility] is asking if you could make the 0-20 units to be ordered as SSI with meals only  eICU Interventions  Ordered as requested.     Intervention Category Intermediate Interventions: Hyperglycemia - evaluation and treatment  Ranee Gosselin 05/13/2023, 9:31 PM

## 2023-05-13 NOTE — Progress Notes (Addendum)
NAME:  Ariana Richwine., MRN:  [id number], DOB:  August 10, 1951, LOS: 2 ADMISSION DATE:  05/10/2023, CONSULTATION DATE: 05/11/2023 REFERRING MD: Emergency department physician, CHIEF COMPLAINT: Acute respiratory failure  History of Present Illness:  Mr. Merlos is a 72 year old male who was recently discharged on 05/10/2023 to a skilled nursing facility after being admitted for syncope and returns on 05/11/2023 with acute hypercarbic respiratory failure.  He failed BiPAP and required mechanical ventilation due to poor mental status PMH -COPD on 3 L home oxygen, OSA on nocturnal BiPAP, bladder cancer Presumptive initial diagnosis with COPD exacerbation versus pulmonary edema.  He required Levophed for sedation induced hypotension. CT angiogram chest was negative for PE Head CT was negative for acute stroke  Pertinent  Medical History   Past Medical History:  Diagnosis Date   Arthritis    Cancer (HCC)    bladder cancer currently 2016   Complication of anesthesia    hard for him to wake up from Anesthesia from left nephrectomy   COPD (chronic obstructive pulmonary disease) (HCC)    Diabetes mellitus without complication (HCC)    DVT (deep venous thrombosis) (HCC) 01/08/2014, 2010   upper extremity   GERD (gastroesophageal reflux disease)    Hallux limitus 05/18/2015   from notes from [facility]    Headache    migraines   History of kidney stones    Hypercholesterolemia    Hypertension    Impaired hearing    Intervertebral disc syndrome    Kidney stones    Pulmonary fibrosis (HCC)    Renal calculi 01/08/2014   frrom noted from [facility] .in chart   Restless legs 01/08/2014   Sciatic leg pain    paralysis of sciatic nerve   Sleep apnea    CPAP/BIPAP   Tinnitus      Significant Hospital Events: Including procedures, antibiotic start and stop dates in addition to other pertinent events   05/11/2023 intubated per [facility] 6/14 extubated   Interim History / Subjective:  Extubated  yesterday Remains bradycardic -- sometimes sinus brady sometimes looks like fq PACs   Objective   Blood pressure (!) 104/56, pulse (!) 56, temperature (!) 96.1 F (35.6 C), temperature source Axillary, resp. rate 13, height 5\' 8"  (1.727 m), weight 119.8 kg, SpO2 97 %.    Vent Mode: PRVC FiO2 (%):  [32 %-40 %] 32 % Set Rate:  [20 bmp] 20 bmp Vt Set:  [550 mL] 550 mL PEEP:  [5 cmH20] 5 cmH20 Plateau Pressure:  [18 cmH20] 18 cmH20   Intake/Output Summary (Last 24 hours) at 05/13/2023 1048 Last data filed at 05/13/2023 0500 Gross per 24 hour  Intake 443.6 ml  Output 1375 ml  Net -931.4 ml   Filed Weights   05/11/23 0002 05/11/23 0935 05/12/23 0500  Weight: 119 kg 120.1 kg 119.8 kg    Examination: General: Morbidly obese elderly M NAD  HENT: CPAP  Lungs: Decreased bilat sounds but symmetrical chest expansion, even and unlabored  Cardiovascular: bradycardic, intermittently irregular Abdomen: obese soft  Extremities: BLE edema  Neuro: AAOx3  GU: external cath    Resolved Hospital Problem list     Assessment & Plan:   Syncope // Fq falls (?mechanical) Bradycardia  -this has been ongoing problem for pt outpt (falls, thought maybe mechanical) -syncope is what preceded [facility] admission on 6/8, and in d/w pt it sounds like it was another syncopal episode at SNF following dc that prompted coming back to the ED -- not  that he abruptly developed resp failure  -had some orthostasis / vasovagal presyncope during [facility] admit, this was thought to be driving etiology.  -current admit has been bradycardic -- was felt to be sedation related but has continued after extubation. Looking at [facility] admit I can see "SB" in flowchart so wondering if this happened intermittently there and just not captured as often without ICU monitoring and is associated w some of these episodes?  -CTH was neg.  P -will get an EKG -- in the room looked like an irreg brady rhythm on monitor, then sinus brady. will try to  capture irregularity  -will consult cards -optimize lytes -PT/OT -TSH, cortisol   AoC hypercarbic and hypoxic resp failure COPD, home 3L  Intubated 05/11/2023 emergency department, unclear what caused worsening so soon after discharge to SNF. Etiology of resp failure somewhat unclear. Some c/f AECOPD in ED. During [facility] course c/f HF, was diuresed. On imaging here, no PE, no overt pulm edema on CT, but CXR 6/15 looks like maybe some edema  P -budesonide/Brovana/Yupelri combination -pulm hygiene, mobility  -CPAP noct  -PRN diuresis   Diastolic HF Mild  RV failure  -PRN diuresis  -would rec an OP repeat ECHO; poor windows and was intubated during ICU echo   AKI, improving  Hx solitary kidney Hx bladder cancer  P -trend renal indices UOP  DM2 w hyperglycemia  - incr insulin coverage    Best Practice (right click and "Reselect all SmartList Selections" daily)   Diet/type: Regular consistency (see orders) DVT prophylaxis: prophylactic heparin  GI prophylaxis: PPI Lines: N/A Foley:  N/A Code Status:  full code Last date of multidisciplinary goals of care discussion [full code] Dispo: transfer out of ICU 6/15   Labs   CBC: Recent Labs  Lab 05/06/23 1845 05/07/23 0322 05/08/23 0418 05/10/23 0406 05/11/23 0015 05/11/23 0022 05/11/23 3086 05/11/23 0745 05/11/23 0820 05/12/23 0045 05/13/23 0133  WBC 4.5   < > 4.5 4.3 5.6  --   --   --  4.2 8.3 6.5  NEUTROABS 3.1  --  2.9  --  3.7  --   --   --   --   --  4.6  HGB 13.0   < > 12.0* 12.7* 14.1   < > 15.0 14.3 13.1 13.2 13.4  HCT 41.9   < > 40.6 43.3 47.8   < > 44.0 42.0 44.1 43.4 45.7  MCV 95.9   < > 99.0 99.8 100.6*  --   --   --  99.5 97.3 98.9  PLT 154   < > 138* 135* 143*  --   --   --  163 189 164   < > = values in this interval not displayed.    Basic Metabolic Panel: Recent Labs  Lab 05/11/23 0653 05/11/23 0745 05/11/23 2124 05/12/23 0045 05/12/23 0301 05/12/23 0550 05/13/23 0133  NA 138 138 142 141  140  --  137  K 5.7* 5.3* 4.6 5.2* 4.7 4.8 3.8  CL 100  --  102 103 102  --  94*  CO2 25  --  30 29 27   --  26  GLUCOSE 408*  --  177* 250* 257*  --  209*  BUN 32*  --  41* 46* 50*  --  60*  CREATININE 1.45*  --  1.79* 1.91* 2.00*  --  1.74*  CALCIUM 8.9  --  9.5 9.3 9.1  --  9.2  MG  --   --  2.6* 2.5*  --   --  2.4  PHOS  --   --  2.6 3.8  --   --  3.9   GFR: Estimated Creatinine Clearance: 48.3 mL/min (A) (by C-G formula based on SCr of 1.74 mg/dL (H)). Recent Labs  Lab 05/06/23 1943 05/06/23 2138 05/07/23 0322 05/11/23 0015 05/11/23 0653 05/11/23 0820 05/12/23 0045 05/13/23 0133  PROCALCITON  --   --   --   --   --  0.17  --   --   WBC  --   --    < > 5.6  --  4.2 8.3 6.5  LATICACIDVEN 2.4* 1.6  --  0.7 1.8  --   --   --    < > = values in this interval not displayed.    Liver Function Tests: Recent Labs  Lab 05/06/23 1845 05/11/23 0015  AST 15 19  ALT 13 19  ALKPHOS 76 86  BILITOT 0.7 0.4  PROT 6.6 7.3  ALBUMIN 3.1* 2.9*   No results for input(s): "LIPASE", "AMYLASE" in the last 168 hours. No results for input(s): "AMMONIA" in the last 168 hours.  ABG    Component Value Date/Time   PHART 7.372 05/11/2023 0745   PCO2ART 54.6 (H) 05/11/2023 0745   PO2ART 209 (H) 05/11/2023 0745   HCO3 31.6 (H) 05/11/2023 0745   TCO2 33 (H) 05/11/2023 0745   ACIDBASEDEF 0.8 02/10/2017 1740   O2SAT 100 05/11/2023 0745     Coagulation Profile: Recent Labs  Lab 05/06/23 1943 05/11/23 0015 05/11/23 0820  INR 1.1 1.2 1.2    Cardiac Enzymes: No results for input(s): "CKTOTAL", "CKMB", "CKMBINDEX", "TROPONINI" in the last 168 hours.  HbA1C: Hgb A1c MFr Bld  Date/Time Value Ref Range Status  05/11/2023 08:20 AM 8.6 (H) 4.8 - 5.6 % Final    Comment:    (NOTE) Pre diabetes:          5.7%-6.4%  Diabetes:              >6.4%  Glycemic control for   <7.0% adults with diabetes   05/06/2023 06:49 PM 8.6 (H) 4.8 - 5.6 % Final    Comment:    (NOTE) Pre diabetes:           5.7%-6.4%  Diabetes:              >6.4%  Glycemic control for   <7.0% adults with diabetes     CBG: Recent Labs  Lab 05/12/23 1926 05/12/23 2315 05/12/23 2317 05/13/23 0325 05/13/23 0802  GLUCAP 234* 104* 162* 192* 207*   CCT: n/a   Tessie Fass MSN, AGACNP-BC [facility] Amion for pager  05/13/2023, 10:48 AM

## 2023-05-13 NOTE — Progress Notes (Signed)
eLink Physician-Brief Progress Note Patient Name: Ricky WEISENBERG Sr. DOB: [birth year] MRN: [id number]   Date of Service  05/13/2023  HPI/Events of Note  Insomnia  eICU Interventions  Melatonin ordered     Intervention Category Minor Interventions: Other:  Ranee Gosselin 05/13/2023, 7:59 PM

## 2023-05-13 NOTE — Progress Notes (Signed)
Pt. Has his own cpap machine from home. Pt. Able to operate and place himself on when ready.

## 2023-05-13 NOTE — Consult Note (Signed)
Cardiology Consultation   Patient ID: Ricky NORUM Sr. MRN: [id number]; DOB: 1951/04/02  Admit date: 05/10/2023 Date of Consult: 05/13/2023  PCP:  [name], MD   [facility] Providers Cardiologist:  None   New this Admission   Patient Profile:   Ricky Stadtlander. is a 72 y.o. male with a hx of COPD, type 2 DM, history of DVT, GERD, HLD, HTN, pulmonary fibrosis, OSA on CPAP/BIPAP, bladder cancer s/p resection who is being seen 05/13/2023 for the evaluation of Syncope and bradycardia at the request of Dr. Wynona Neat.  History of Present Illness:   Ricky Johnston is a 72 year old male with above medical history. Patient was briefly followed by Dr. Sharyn Lull in 2016 for evaluation of chest pain. Underwent nuclear stress test on 01/09/15 that showed a large, fixed defect involving the inferolateral wall that was consistent with an area of infarct There was concern for ischemia and reversibility along the mid segment of the inferior wall, but this reversibility could be artifactual related to differences in the LV orientation. He underwent LHC on 01/10/15 that showed EF 45-50%, mild, nonobstructive disease. More recently, patient has been seen by [facility] cardiology. Had an echocardiogram on 11/16/18 that showed EF 55-60%, mild LVH.   Patient was recently admitted to [facility] from 6/8-6/12/24. He had presented to [facility] after he had a syncopal episode. There was concern for orthostasis or vasovagal presyncopal episodes, but primarily appeared to have severe ambulatory dysfunction and weakness. He was volume overloaded and was diuresed. Due to significant weakness and difficulty standing up, patient was not in a safe condition to discharge home. He was ultimately discharged to a SNF on 6/12.  On 6/13, patient presented to the ED from his SNF complaining of difficulty breathing. EMS reported that Oxygen saturation was 84% on 3 L. While in the ED, patient  was somnolent and difficult to arouse. He was intubated for airway protection, started on empirical antimicrobial therapy, and was admitted to the ICU. He required additional sedation and had a subsequent drop in BP that was treated with Levophed. He was also hyperglycemic and required insulin drip.   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  BNP 32.9. Procalcitonin 0.17 Echocardiogram 6/13 showed EF 55-60%, no regional wall motion abnormalities, mildly reduced RV systolic function.  In the ICU, patient was given 1 dose of IV lasix. On 6/14, patient was taken off antibiotics and was started on budesonide/Brovana/Yupeliri combination. He was extubated on 6/14. On 6/15, patient explained what sounded like a syncopal episode at SNF that prompted him to come back to the ED. Patient was noted to be bradycardic with HR as low as the 40s. Cardiology was consulted.   On interview, patient reported that he does not remember anything before coming to the hospital. His wife at bedside helped fill in his history. On 6/8, patient was at home when his family noticed that he was very fatigued. It appeared that patient kept dozing off and was in and out of conciousness. Family and patient denied true syncope, but rather felt that patient was extremely tired and was falling asleep. He was taken to the ED and ultimately discharged to an assisted living facility. When the patient was at facility, the facility called his wife and told her that his breathing was bad. They asked her to to bring his  CPAP. While she was on the way to the facility, they called her back and told her that they were sending the patient to the ED. Wife states that she was not told about any syncopal episodes, and the patient denies syncope. Currently, patient is feeling much better. Since being extubated yesterday,  he reports that his breathing has been stable. Denies chest pain, palpitations, syncope,  near syncope, dizziness. He does feel "tired."    Past Medical History:  Diagnosis Date   Arthritis    Cancer (HCC)    bladder cancer currently 2016   Complication of anesthesia    hard for him to wake up from Anesthesia from left nephrectomy   COPD (chronic obstructive pulmonary disease) (HCC)    Diabetes mellitus without complication (HCC)    DVT (deep venous thrombosis) (HCC) 01/08/2014, 2010   upper extremity   GERD (gastroesophageal reflux disease)    Hallux limitus 05/18/2015   from notes from [facility]    Headache    migraines   History of kidney stones    Hypercholesterolemia    Hypertension    Impaired hearing    Intervertebral disc syndrome    Kidney stones    Pulmonary fibrosis (HCC)    Renal calculi 01/08/2014   frrom noted from [facility] .in chart   Restless legs 01/08/2014   Sciatic leg pain    paralysis of sciatic nerve   Sleep apnea    CPAP/BIPAP   Tinnitus     Past Surgical History:  Procedure Laterality Date   ABDOMINAL SURGERY     BACK SURGERY     BLADDER REPAIR     EYE SURGERY     HERNIA REPAIR     LEFT HEART CATHETERIZATION WITH CORONARY ANGIOGRAM N/A 01/09/2015   Procedure: LEFT HEART CATHETERIZATION WITH CORONARY ANGIOGRAM;  Surgeon: Robynn Pane, MD;  Location: [facility] CATH LAB;  Service: Cardiovascular;  Laterality: N/A;   NEPHROURETERECTOMY  03/18/2016   with bladder cuff excision- from noted in chart from [facility]   TOTAL HIP ARTHROPLASTY Left 02/10/2017   Procedure: LEFT TOTAL HIP ARTHROPLASTY ANTERIOR APPROACH;  Surgeon: Kathryne Hitch, MD;  Location: [facility] ORS;  Service: Orthopedics;  Laterality: Left;     Home Medications:  Prior to Admission medications   Medication Sig Start Date End Date Taking? Authorizing Provider  albuterol (PROVENTIL HFA;VENTOLIN HFA) 108 (90 Base) MCG/ACT inhaler Inhale 2 puffs into the lungs every 6 (six) hours as  needed for wheezing or shortness of breath.   Yes [provider]  aspirin 81 MG chewable tablet Chew 1 tablet (81 mg total) by mouth 2 (two) times daily after a meal. Patient taking differently: Chew 81 mg by mouth in the morning and at bedtime. 02/14/17  Yes Kathryne Hitch, MD  atorvastatin (LIPITOR) 80 MG tablet Take 80 mg by mouth at bedtime.   Yes [provider]  cholecalciferol (VITAMIN D) 1000 UNITS tablet Take 1,000 Units by mouth daily.   Yes [provider]  empagliflozin (JARDIANCE) 25 MG TABS tablet Take 25 mg by mouth daily.   Yes [provider]  ferrous sulfate 325 (65 FE) MG tablet Take 325 mg by mouth daily.   Yes [provider]  fluocinonide (LIDEX) 0.05 % external solution Apply 1 Application topically daily as needed (itchy rash on scalp).   Yes [provider]  fluticasone (FLONASE) 50 MCG/ACT nasal spray Place 1-2 sprays into both nostrils in the morning. 07/25/18  Yes [provider]  furosemide (LASIX) 20 MG tablet Take 20 mg by mouth in the morning.   Yes [provider]  gabapentin (NEURONTIN) 300 MG capsule Take 600 mg by mouth 3 (three) times daily.   Yes [provider]  HUMULIN R U-500 KWIKPEN 500 UNIT/ML kwikpen Inject 30-60 Units into the skin See admin instructions. Inject 30 units under the skin in the morning, 60 units in the afternoon and 60 units in the evening 09/14/18  Yes [provider]  hydrocortisone 2.5 % cream Apply 1 Application topically 2 (two) times daily as needed (rash on face).   Yes [provider]  ketoconazole (NIZORAL) 2 % shampoo Apply 1 Application topically every Monday, Wednesday, and Friday.   Yes [provider]  loratadine (ALAVERT) 10 MG dissolvable tablet Take 10 mg by mouth daily as needed for allergies.   Yes [provider]  rOPINIRole (REQUIP) 0.5 MG tablet Take 0.5 mg by mouth at bedtime. 05/26/21 05/10/24 Yes  [provider]  rOPINIRole (REQUIP) 1 MG tablet Take 1 tablet by mouth at bedtime. 09/25/20 07/08/23 Yes [provider]  tamsulosin (FLOMAX) 0.4 MG CAPS capsule Take 0.4 mg by mouth at bedtime.   Yes [provider]  Tiotropium Bromide Monohydrate (SPIRIVA RESPIMAT) 2.5 MCG/ACT AERS Inhale 2 each into the lungs daily as needed (wheezing, shortness of breath.).   Yes [provider]  Fluticasone-Salmeterol (ADVAIR) 250-50 MCG/DOSE AEPB Inhale 1 puff into the lungs daily. Patient not taking: Reported on 05/11/2023 08/24/20   [provider]  OXYGEN Inhale 2-3 L/min into the lungs See admin instructions. 2 L/min continuous. May increase to 3 L/min as needed for shortness of breath.    [provider]  Tirzepatide [facility]) Inject 1 Dose into the skin every Sunday. Patient not taking: Reported on 05/11/2023    [provider]    Inpatient Medications: Scheduled Meds:  arformoterol  15 mcg Nebulization BID   budesonide (PULMICORT) nebulizer solution  0.5 mg Nebulization BID   Chlorhexidine Gluconate Cloth  6 each Topical Daily   gabapentin  300 mg Oral Q12H   heparin  5,000 Units Subcutaneous Q8H   insulin aspart  0-20 Units Subcutaneous TID AC & HS   insulin aspart  0-5 Units Subcutaneous QHS   insulin aspart  5 Units Subcutaneous TID WC   insulin glargine-yfgn  18 Units Subcutaneous QHS   revefenacin  175 mcg Nebulization Daily   Continuous Infusions:  sodium chloride Stopped (05/12/23 0414)   sodium chloride Stopped (05/12/23 1410)   PRN Meds: docusate sodium, ipratropium-albuterol, mouth rinse, phenol, polyethylene glycol  Allergies:    Allergies  Allergen Reactions   Poison Ivy Extract [Poison Ivy Extract] Rash   Poison Oak Extract Rash    Social History:   Social History   Socioeconomic History   Marital status: Married    Spouse name: Not on file   Number of children: Not on file   Years of education: Not  on file   Highest education level: Not on file  Occupational History   Not on file  Tobacco Use   Smoking status: Former    Packs/day: 1.00    Years: 33.00    Additional pack years: 0.00    Total pack years: 33.00    Types: Cigarettes    Quit date: 04/06/2000    Years since quitting: 23.1   Smokeless tobacco: Never   Tobacco comments:    use to chew cigars  Substance  and Sexual Activity   Alcohol use: Not Currently    Comment: rarely   Drug use: Never    Comment: patient states does not use illegal drugs   Sexual activity: Not on file  Other Topics Concern   Not on file  Social History Narrative   Not on file   Social Determinants of Health   Financial Resource Strain: Not on file  Food Insecurity: No Food Insecurity (05/06/2023)   Hunger Vital Sign    Worried About Running Out of Food in the Last Year: Never true    Ran Out of Food in the Last Year: Never true  Transportation Needs: No Transportation Needs (05/06/2023)   PRAPARE - Administrator, Civil Service (Medical): No    Lack of Transportation (Non-Medical): No  Physical Activity: Not on file  Stress: Not on file  Social Connections: Not on file  Intimate Partner Violence: Not At Risk (05/06/2023)   Humiliation, Afraid, Rape, and Kick questionnaire    Fear of Current or Ex-Partner: No    Emotionally Abused: No    Physically Abused: No    Sexually Abused: No    Family History:   No family history on file.   ROS:  Please see the history of present illness.   All other ROS reviewed and negative.     Physical Exam/Data:   Vitals:   05/13/23 1115 05/13/23 1130 05/13/23 1132 05/13/23 1200  BP:    120/61  Pulse: (!) 36 (!) 35  (!) 43  Resp: (!) 26 18  18   Temp:   98 F (36.7 C)   TempSrc:   Oral   SpO2: 100% 96%  98%  Weight:      Height:        Intake/Output Summary (Last 24 hours) at 05/13/2023 1249 Last data filed at 05/13/2023 0500 Gross per 24 hour  Intake 443.6 ml  Output 1375 ml   Net -931.4 ml      05/12/2023    5:00 AM 05/11/2023    9:35 AM 05/11/2023   12:02 AM  Last 3 Weights  Weight (lbs) 264 lb 1.8 oz 264 lb 12.4 oz 262 lb 5.6 oz  Weight (kg) 119.8 kg 120.1 kg 119 kg     Body mass index is 40.16 kg/m.  General:  Elderly male, sitting upright in the armchair in no acute distress.  HEENT: normal Neck: no JVD Vascular: Radial pulses 2+ bilaterally Cardiac:  normal S1, S2; RRR; grade 2/6 systolic murmur  Lungs:  crackles in bilateral lung bases. Normal WOB on [place]  Abd: soft, nontender Ext: 1+ edema in BlLE  Musculoskeletal:  No deformities, BUE and BLE strength normal and equal Skin: warm and dry  Neuro:  CNs 2-12 intact, no focal abnormalities noted Psych:  Normal affect   EKG:  The EKG was personally reviewed and demonstrates:  EKG 6/15 showed sinus bradycardia with PACs, HR 47 BPM  Telemetry:  Telemetry was personally reviewed and demonstrates:  sinus bradycardia with frequent PACs and atrial bigeminy. HR in the 40s-70s   Relevant CV Studies:  Cardiac Studies & Procedures     STRESS TESTS  NM MYOCAR MULTI W/SPECT W 01/09/2015  Narrative CLINICAL DATA:  72 year old with chest pain, unspecified chest pain type.  EXAM: MYOCARDIAL IMAGING WITH SPECT (REST AND PHARMACOLOGIC-STRESS)  GATED LEFT VENTRICULAR WALL MOTION STUDY  LEFT VENTRICULAR EJECTION FRACTION  TECHNIQUE: Standard myocardial SPECT imaging was performed after resting intravenous injection of 10 mCi Tc-39m sestamibi.  Subsequently, intravenous infusion of Lexiscan was performed under the supervision of the Cardiology staff. At peak effect of the drug, 30 mCi Tc-64m sestamibi was injected intravenously and standard myocardial SPECT imaging was performed. Quantitative gated imaging was also performed to evaluate left ventricular wall motion, and estimate left ventricular ejection fraction.  COMPARISON:  Chest CT 01/08/2015  FINDINGS: Perfusion: There is a large fixed defect  involving the inferolateral wall. Findings are compatible with an area of infarct. There is concern for reversibility along the inferior wall in the mid segment. The orientation of the left ventricle is slightly different on the rest and stress images which could create false positive reversibility.  Wall Motion: There is mild hypokinesia, particularly along the inferior wall.  Left Ventricular Ejection Fraction: 48 %  End diastolic volume 145 ml  End systolic volume 75 ml  IMPRESSION: 1. Large fixed defect involving the inferolateral wall is consistent with an area of infarct. There is concern for ischemia and reversibility along the mid segment of the inferior wall. However, this reversibility could be artifactual related to differences in left ventricle orientation between the rest and stress images. However, an area of reversibility cannot be excluded in the inferior wall.  2. Mild hypokinesia.  3. Left ventricular ejection fraction is 48%.  4. Intermediate-risk stress test findings*.  *2012 Appropriate Use Criteria for Coronary Revascularization Focused Update: J Am Coll Cardiol. 2012;59(9):857-881. http://content.dementiazones.com.aspx?articleid=1201161   Electronically Signed By: Richarda Overlie M.D. On: 01/09/2015 14:04   ECHOCARDIOGRAM  ECHOCARDIOGRAM COMPLETE 05/11/2023  Narrative ECHOCARDIOGRAM REPORT    Patient Name:   Ricky GILLIN Sr. Date of Exam: 05/11/2023 Medical Rec #:  [id number]          Height:       68.0 in Accession #:    [id number]         Weight:       264.8 lb Date of Birth:  1951/10/06          BSA:          2.303 m Patient Age:    72 years           BP:           112/60 mmHg Patient Gender: M                  HR:           51 bpm. Exam Location:  Inpatient  Procedure: 2D Echo, Color Doppler, Cardiac Doppler and Intracardiac Opacification Agent  Indications:    Shock  History:        Patient has no prior history of Echocardiogram  examinations. CAD, COPD and CKD, Signs/Symptoms:Syncope; Risk Factors:Sleep Apnea, Dyslipidemia, Hypertension and Diabetes.  Sonographer:    Milbert Coulter Referring Phys: [id number] WILLIAM S MINOR   Sonographer Comments: Echo performed with patient supine and on artificial respirator, patient is obese, suboptimal apical window and suboptimal parasternal window. Image acquisition challenging due to COPD. IMPRESSIONS   1. Left ventricular ejection fraction, by estimation, is 55 to 60%. The left ventricle has normal function. The left ventricle has no regional wall motion abnormalities. Left ventricular diastolic parameters are indeterminate. 2. Right ventricular systolic function is mildly reduced. The right ventricular size is normal. 3. The mitral valve is normal in structure. No evidence of mitral valve regurgitation. No evidence of mitral stenosis. Moderate mitral annular calcification. 4. The aortic valve was not well visualized. There is moderate calcification of the aortic valve. Aortic valve regurgitation  is not visualized. No aortic stenosis is present. 5. The inferior vena cava is dilated in size with <50% respiratory variability, suggesting right atrial pressure of 15 mmHg. 6. Very limited study due to poor sound wave transmission.  FINDINGS Left Ventricle: Left ventricular ejection fraction, by estimation, is 55 to 60%. The left ventricle has normal function. The left ventricle has no regional wall motion abnormalities. Definity contrast agent was given IV to delineate the left ventricular endocardial borders. The left ventricular internal cavity size was normal in size. There is no left ventricular hypertrophy. Left ventricular diastolic parameters are indeterminate.  Right Ventricle: The right ventricular size is normal. No increase in right ventricular wall thickness. Right ventricular systolic function is mildly reduced.  Left Atrium: Left atrial size was normal in size.  Right  Atrium: Right atrial size was normal in size.  Pericardium: There is no evidence of pericardial effusion.  Mitral Valve: The mitral valve is normal in structure. Moderate mitral annular calcification. No evidence of mitral valve regurgitation. No evidence of mitral valve stenosis.  Tricuspid Valve: The tricuspid valve is not well visualized. Tricuspid valve regurgitation is trivial. No evidence of tricuspid stenosis.  Aortic Valve: The aortic valve was not well visualized. There is moderate calcification of the aortic valve. Aortic valve regurgitation is not visualized. No aortic stenosis is present. Aortic valve mean gradient measures 5.0 mmHg. Aortic valve peak gradient measures 9.5 mmHg.  Pulmonic Valve: The pulmonic valve was not well visualized. Pulmonic valve regurgitation is not visualized. No evidence of pulmonic stenosis.  Aorta: The aortic root is normal in size and structure.  Venous: The inferior vena cava is dilated in size with less than 50% respiratory variability, suggesting right atrial pressure of 15 mmHg.  IAS/Shunts: No atrial level shunt detected by color flow Doppler.   LEFT VENTRICLE PLAX 2D LVIDd:         5.90 cm Diastology LVIDs:         3.90 cm LV e' medial:    6.20 cm/s LV PW:         0.90 cm LV E/e' medial:  10.3 LV IVS:        0.90 cm LV e' lateral:   9.90 cm/s LV E/e' lateral: 6.5   RIGHT VENTRICLE RV S prime:     13.50 cm/s TAPSE (M-mode): 1.9 cm  AORTIC VALVE AV Vmax:           154.00 cm/s AV Vmean:          97.900 cm/s AV VTI:            0.276 m AV Peak Grad:      9.5 mmHg AV Mean Grad:      5.0 mmHg LVOT Vmax:         105.00 cm/s LVOT Vmean:        66.600 cm/s LVOT VTI:          0.212 m LVOT/AV VTI ratio: 0.77  MITRAL VALVE MV Area (PHT): 2.76 cm    SHUNTS MV Decel Time: 275 msec    Systemic VTI: 0.21 m MV E velocity: 63.90 cm/s MV A velocity: 68.70 cm/s MV E/A ratio:  0.93  Arvilla Meres MD Electronically signed by Arvilla Meres MD Signature Date/Time: 05/11/2023/3:26:19 PM    Final              Laboratory Data:  High Sensitivity Troponin:   Recent Labs  Lab 05/06/23 1845 05/06/23 1943 05/11/23 0120 05/11/23 0300  TROPONINIHS 10 10 15 13      Chemistry Recent Labs  Lab 05/11/23 2124 05/12/23 0045 05/12/23 0301 05/12/23 0550 05/13/23 0133  NA 142 141 140  --  137  K 4.6 5.2* 4.7 4.8 3.8  CL 102 103 102  --  94*  CO2 30 29 27   --  26  GLUCOSE 177* 250* 257*  --  209*  BUN 41* 46* 50*  --  60*  CREATININE 1.79* 1.91* 2.00*  --  1.74*  CALCIUM 9.5 9.3 9.1  --  9.2  MG 2.6* 2.5*  --   --  2.4  GFRNONAA 40* 37* 35*  --  41*  ANIONGAP 10 9 11   --  17*    Recent Labs  Lab 05/06/23 1845 05/11/23 0015  PROT 6.6 7.3  ALBUMIN 3.1* 2.9*  AST 15 19  ALT 13 19  ALKPHOS 76 86  BILITOT 0.7 0.4   Lipids No results for input(s): "CHOL", "TRIG", "HDL", "LABVLDL", "LDLCALC", "CHOLHDL" in the last 168 hours.  Hematology Recent Labs  Lab 05/11/23 0820 05/12/23 0045 05/13/23 0133  WBC 4.2 8.3 6.5  RBC 4.43 4.46 4.62  HGB 13.1 13.2 13.4  HCT 44.1 43.4 45.7  MCV 99.5 97.3 98.9  MCH 29.6 29.6 29.0  MCHC 29.7* 30.4 29.3*  RDW 13.7 13.8 14.1  PLT 163 189 164   Thyroid No results for input(s): "TSH", "FREET4" in the last 168 hours.  BNP Recent Labs  Lab 05/11/23 0120  BNP 32.9    DDimer No results for input(s): "DDIMER" in the last 168 hours.   Radiology/Studies:  DG Chest Port 1 View  Result Date: 05/13/2023 CLINICAL DATA:  72 year old male with history of shortness of breath following extubation. EXAM: PORTABLE CHEST 1 VIEW COMPARISON:  Chest x-ray 05/11/2023. FINDINGS: Patient has been extubated. Previously noted nasogastric tube has also been removed. Lung volumes are normal. Patchy areas of interstitial prominence, widespread peribronchial cuffing and patchy ill-defined opacities are again noted in the lungs, most evident in the periphery of the left lung base at the  costophrenic sulcus. Overall, aeration appears slightly improved. No definite pleural effusions. Mild cephalization of the pulmonary vasculature. Mild cardiomegaly. The patient is rotated to the right on today's exam, resulting in distortion of the mediastinal contours and reduced diagnostic sensitivity and specificity for mediastinal pathology. IMPRESSION: 1. Improving aeration in the lungs likely reflecting resolving congestive heart failure. Electronically Signed   By: Trudie Reed M.D.   On: 05/13/2023 07:35   ECHOCARDIOGRAM COMPLETE  Result Date: 05/11/2023    ECHOCARDIOGRAM REPORT   Patient Name:   Ricky DAYAL Sr. Date of Exam: 05/11/2023 Medical Rec #:  [id number]          Height:       68.0 in Accession #:    [id number]         Weight:       264.8 lb Date of Birth:  [birth year]          BSA:          2.303 m Patient Age:    72 years           BP:           112/60 mmHg Patient Gender: M                  HR:           51 bpm. Exam Location:  Inpatient Procedure: 2D Echo, Color Doppler, Cardiac  Doppler and Intracardiac            Opacification Agent Indications:    Shock  History:        Patient has no prior history of Echocardiogram examinations.                 CAD, COPD and CKD, Signs/Symptoms:Syncope; Risk Factors:Sleep                 Apnea, Dyslipidemia, Hypertension and Diabetes.  Sonographer:    Milbert Coulter Referring Phys: [id number] WILLIAM S MINOR  Sonographer Comments: Echo performed with patient supine and on artificial respirator, patient is obese, suboptimal apical window and suboptimal parasternal window. Image acquisition challenging due to COPD. IMPRESSIONS  1. Left ventricular ejection fraction, by estimation, is 55 to 60%. The left ventricle has normal function. The left ventricle has no regional wall motion abnormalities. Left ventricular diastolic parameters are indeterminate.  2. Right ventricular systolic function is mildly reduced. The right ventricular size is normal.  3. The mitral  valve is normal in structure. No evidence of mitral valve regurgitation. No evidence of mitral stenosis. Moderate mitral annular calcification.  4. The aortic valve was not well visualized. There is moderate calcification of the aortic valve. Aortic valve regurgitation is not visualized. No aortic stenosis is present.  5. The inferior vena cava is dilated in size with <50% respiratory variability, suggesting right atrial pressure of 15 mmHg.  6. Very limited study due to poor sound wave transmission. FINDINGS  Left Ventricle: Left ventricular ejection fraction, by estimation, is 55 to 60%. The left ventricle has normal function. The left ventricle has no regional wall motion abnormalities. Definity contrast agent was given IV to delineate the left ventricular  endocardial borders. The left ventricular internal cavity size was normal in size. There is no left ventricular hypertrophy. Left ventricular diastolic parameters are indeterminate. Right Ventricle: The right ventricular size is normal. No increase in right ventricular wall thickness. Right ventricular systolic function is mildly reduced. Left Atrium: Left atrial size was normal in size. Right Atrium: Right atrial size was normal in size. Pericardium: There is no evidence of pericardial effusion. Mitral Valve: The mitral valve is normal in structure. Moderate mitral annular calcification. No evidence of mitral valve regurgitation. No evidence of mitral valve stenosis. Tricuspid Valve: The tricuspid valve is not well visualized. Tricuspid valve regurgitation is trivial. No evidence of tricuspid stenosis. Aortic Valve: The aortic valve was not well visualized. There is moderate calcification of the aortic valve. Aortic valve regurgitation is not visualized. No aortic stenosis is present. Aortic valve mean gradient measures 5.0 mmHg. Aortic valve peak gradient measures 9.5 mmHg. Pulmonic Valve: The pulmonic valve was not well visualized. Pulmonic valve  regurgitation is not visualized. No evidence of pulmonic stenosis. Aorta: The aortic root is normal in size and structure. Venous: The inferior vena cava is dilated in size with less than 50% respiratory variability, suggesting right atrial pressure of 15 mmHg. IAS/Shunts: No atrial level shunt detected by color flow Doppler.  LEFT VENTRICLE PLAX 2D LVIDd:         5.90 cm Diastology LVIDs:         3.90 cm LV e' medial:    6.20 cm/s LV PW:         0.90 cm LV E/e' medial:  10.3 LV IVS:        0.90 cm LV e' lateral:   9.90 cm/s  LV E/e' lateral: 6.5  RIGHT VENTRICLE RV S prime:     13.50 cm/s TAPSE (M-mode): 1.9 cm AORTIC VALVE AV Vmax:           154.00 cm/s AV Vmean:          97.900 cm/s AV VTI:            0.276 m AV Peak Grad:      9.5 mmHg AV Mean Grad:      5.0 mmHg LVOT Vmax:         105.00 cm/s LVOT Vmean:        66.600 cm/s LVOT VTI:          0.212 m LVOT/AV VTI ratio: 0.77 MITRAL VALVE MV Area (PHT): 2.76 cm    SHUNTS MV Decel Time: 275 msec    Systemic VTI: 0.21 m MV E velocity: 63.90 cm/s MV A velocity: 68.70 cm/s MV E/A ratio:  0.93 Arvilla Meres MD Electronically signed by Arvilla Meres MD Signature Date/Time: 05/11/2023/3:26:19 PM    Final    DG Chest Portable 1 View  Result Date: 05/11/2023 CLINICAL DATA:  72 year old male status post intubation. EXAM: PORTABLE CHEST 1 VIEW COMPARISON:  Chest x-ray 05/11/2023. FINDINGS: An endotracheal tube is in place with tip 5.2 cm above the carina. A nasogastric tube is seen extending into the stomach, however, the tip of the nasogastric tube extends below the lower margin of the image. Lung volumes are slightly low. No confluent consolidative airspace disease. There is cephalization of the pulmonary vasculature and slight indistinctness of the interstitial markings suggestive of mild pulmonary edema. No definite pleural effusions. Mild cardiomegaly. The patient is rotated to the right on today's exam, resulting in distortion of the  mediastinal contours and reduced diagnostic sensitivity and specificity for mediastinal pathology. IMPRESSION: 1. Support apparatus, as above. 2. The appearance the chest suggests mild congestive heart failure, as above. Electronically Signed   By: Trudie Reed M.D.   On: 05/11/2023 06:57   CT Angio Chest PE W and/or Wo Contrast  Result Date: 05/11/2023 CLINICAL DATA:  72 year old male with altered mental status, shortness of breath difficulty breathing. EXAM: CT ANGIOGRAPHY CHEST WITH CONTRAST TECHNIQUE: Multidetector CT imaging of the chest was performed using the standard protocol during bolus administration of intravenous contrast. Multiplanar CT image reconstructions and MIPs were obtained to evaluate the vascular anatomy. RADIATION DOSE REDUCTION: This exam was performed according to the departmental dose-optimization program which includes automated exposure control, adjustment of the mA and/or kV according to patient size and/or use of iterative reconstruction technique. CONTRAST:  75mL OMNIPAQUE IOHEXOL 350 MG/ML SOLN COMPARISON:  portable chest 0006 hours today and earlier. Previous CTA chest 01/08/2015. FINDINGS: Cardiovascular: Suboptimal contrast bolus timing in the pulmonary arterial tree, substantially less than on the 2016 CTA. There is adequate contrast to exclude central, saddle, and lobar pulmonary artery thrombus. But the subsegmental and distal branches are poorly evaluated. There is underlying chronic central pulmonary artery enlargement suggesting a degree of pulmonary artery hypertension. Calcified coronary artery atherosclerosis series 7, image 214. Mild Calcified aortic atherosclerosis. Mild to moderate cardiomegaly does not appear significantly changed from 2016. No pericardial effusion. Mediastinum/Nodes: Lungs/Pleura: Similar lung volumes to 2016 with atelectatic changes to the trachea and other major airways, but the major airways remain patent. Chronic mosaic attenuation in the  lungs, most apparent in the upper lobes. Additional subpleural scarring and/or atelectasis bilaterally. No pleural effusion. No consolidation. Upper Abdomen: Mild hepatomegaly appears stable since 2016. Spleen size  seems to remain within normal limits. Partially visible punctate cholelithiasis. Negative visible adrenal glands, pancreas, right kidney, and bowel in the upper abdomen. Musculoskeletal: Widespread degeneration in the spine. But largely stable appearance of the thoracic spine since 2016. Chronic left lateral 8th and 9th rib fractures. No acute or suspicious osseous lesion. Review of the MIP images confirms the above findings. IMPRESSION: 1. Relatively poor contrast bolus timing. No saddle or lobar pulmonary embolus. But the segmental and distal pulmonary branches are not evaluated. 2. Chronic central pulmonary artery enlargement suggesting pulmonary artery hypertension. And chronic pulmonary mosaic attenuation, similar to a 2016 CTA, which could reflect either chronic small vessel or small airway disease. 3. Atelectasis, but no other superimposed acute pulmonary abnormality. 4. Calcified coronary artery atherosclerosis. Mild aortic Atherosclerosis (ICD10-I70.0). Electronically Signed   By: Odessa Fleming M.D.   On: 05/11/2023 04:53   CT Head Wo Contrast  Result Date: 05/11/2023 CLINICAL DATA:  72 year old male with altered mental status, shortness of breath difficulty breathing. EXAM: CT HEAD WITHOUT CONTRAST TECHNIQUE: Contiguous axial images were obtained from the base of the skull through the vertex without intravenous contrast. RADIATION DOSE REDUCTION: This exam was performed according to the departmental dose-optimization program which includes automated exposure control, adjustment of the mA and/or kV according to patient size and/or use of iterative reconstruction technique. COMPARISON:  None Available. FINDINGS: Brain: No midline shift, ventriculomegaly, mass effect, evidence of mass lesion,  intracranial hemorrhage or evidence of cortically based acute infarction. Cerebral volume is within normal limits for age. Mild to moderate patchy bilateral cerebral white matter hypodensity, but maintained gray-white differentiation throughout. No cortical encephalomalacia. Deep gray nuclei, brainstem and cerebellum remain within normal limits. Vascular: No suspicious intracranial vascular hyperdensity. Mild Calcified atherosclerosis at the skull base. Skull: Negative. Sinuses/Orbits: Visualized paranasal sinuses and mastoids are well aerated. Other: No acute orbit or scalp soft tissue finding. Left parotid sialolithiasis partially visible. IMPRESSION: 1. No acute intracranial abnormality. 2. Mild to moderate for age cerebral white matter changes, most commonly due to chronic small vessel disease. Electronically Signed   By: Odessa Fleming M.D.   On: 05/11/2023 04:46   DG Chest Portable 1 View  Result Date: 05/11/2023 CLINICAL DATA:  Shortness of breath. EXAM: PORTABLE CHEST 1 VIEW COMPARISON:  May 09, 2023 FINDINGS: The cardiac silhouette is mildly enlarged and unchanged in size. Mild atelectasis is seen within the bilateral lung bases. This is mildly increased in severity when compared to the prior study. No pleural effusion or pneumothorax is identified. The visualized skeletal structures are unremarkable. IMPRESSION: Mild bibasilar atelectasis. Electronically Signed   By: Aram Candela M.D.   On: 05/11/2023 00:25     Assessment and Plan:   Bradycardia  Frequent PACs  - Per telemetry, patient has been in sinus bradycardia with frequent PACs, atrial bigeminy. HR as low as the upper 40s. HR does improve appropriately when patient moves around - On my review of telemetry, I did not see any significant pauses or heart block. No indication for PPM at this time  -  TSH ordered for tomorrow AM  - Consider outpatient monitor at DC   Syncope? - Prior to patient's admission at [facility], there was some concern  for syncope. On my discussion with wife and patient, it does not sound like he had a syncopal episode. Patient was sitting in an armchair and felt very fatigued, and his family noticed him going in and out of responsiveness. They thought that patient was very sleepy  -  Echocardiogram this admission showed EF 55-60%, no regional wall motion abnormalities, mildly reduced RV systolic function  - Consider outpatient monitor as above   Acute on chronic diastolic heart failure  Mild RV dysfunction  - Echocardiogram this admission showed EF 55-60%, no regional wall motion abnormalities, mildly reduced RV systolic function - Patient has some mild ankle edema, but is otherwise euvolemic on exam. CXR showed improving aeration in the lungs  - Agree with gentle diuresis as needed   Otherwise per primary  - Acute on Chronic Respiratory Failure  - COPD  - AKI - History of solitary kidney  - History of bladder cancer  - Type 2 DM   Risk Assessment/Risk Scores:    New York Heart Association (NYHA) Functional Class NYHA Class III     For questions or updates, please contact [facility] Please consult [email or web address] for contact info under    Signed, Jonita Albee, PA-C  05/13/2023 12:49 PM

## 2023-05-14 DIAGNOSIS — J9602 Acute respiratory failure with hypercapnia: Secondary | ICD-10-CM

## 2023-05-14 LAB — CORTISOL: Cortisol, Plasma: 3.1 ug/dL

## 2023-05-14 LAB — BASIC METABOLIC PANEL
Anion gap: 8 (ref 5–15)
BUN: 49 mg/dL — ABNORMAL HIGH (ref 8–23)
CO2: 28 mmol/L (ref 22–32)
Calcium: 8.6 mg/dL — ABNORMAL LOW (ref 8.9–10.3)
Chloride: 100 mmol/L (ref 98–111)
Creatinine, Ser: 1.47 mg/dL — ABNORMAL HIGH (ref 0.61–1.24)
GFR, Estimated: 50 mL/min — ABNORMAL LOW (ref >60)
Glucose, Bld: 317 mg/dL — ABNORMAL HIGH (ref 70–99)
Potassium: 4.2 mmol/L (ref 3.5–5.1)
Sodium: 136 mmol/L (ref 135–145)

## 2023-05-14 LAB — GLUCOSE, CAPILLARY
Glucose-Capillary: 239 mg/dL — ABNORMAL HIGH (ref 70–99)
Glucose-Capillary: 277 mg/dL — ABNORMAL HIGH (ref 70–99)
Glucose-Capillary: 285 mg/dL — ABNORMAL HIGH (ref 70–99)
Glucose-Capillary: 227 mg/dL — ABNORMAL HIGH (ref 70–99)

## 2023-05-14 LAB — CULTURE, BLOOD (ROUTINE X 2): Special Requests: ADEQUATE

## 2023-05-14 LAB — CULTURE, RESPIRATORY W GRAM STAIN

## 2023-05-14 LAB — TSH: TSH: 2.197 u[IU]/mL (ref 0.350–4.500)

## 2023-05-14 MED ORDER — FERROUS SULFATE 325 (65 FE) MG PO TABS
325.0000 mg | ORAL_TABLET | Freq: Every day | ORAL | Status: DC
  Administered 2023-05-14 – 2023-05-18 (×5): 325 mg via ORAL
  Filled 2023-05-14 (×5): qty 1

## 2023-05-14 MED ORDER — ATORVASTATIN CALCIUM 80 MG PO TABS
80.0000 mg | ORAL_TABLET | Freq: Every day | ORAL | Status: DC
  Administered 2023-05-14 – 2023-05-17 (×4): 80 mg via ORAL
  Filled 2023-05-14 (×4): qty 1

## 2023-05-14 MED ORDER — INSULIN GLARGINE-YFGN 100 UNIT/ML ~~LOC~~ SOLN
23.0000 [IU] | Freq: Every day | SUBCUTANEOUS | Status: DC
  Administered 2023-05-14: 23 [IU] via SUBCUTANEOUS
  Filled 2023-05-14 (×3): qty 0.23

## 2023-05-14 MED ORDER — TAMSULOSIN HCL 0.4 MG PO CAPS
0.4000 mg | ORAL_CAPSULE | Freq: Every day | ORAL | Status: DC
  Administered 2023-05-14 – 2023-05-17 (×4): 0.4 mg via ORAL
  Filled 2023-05-14 (×4): qty 1

## 2023-05-14 NOTE — Progress Notes (Signed)
PROGRESS NOTE   Ricky Johnston  QVZ:[id number] DOB: [birth year] DOA: 05/10/2023 PCP: [name], MD   Date of Service: the patient was seen and examined on 05/14/2023  Brief Narrative:  72 year old morbidly obese male with COPD, CHF, T2DM, hx of bladder cancer, solitary kidney, who was brought to the ED from his SNF with increasing shortness of breath and syncope, requiring endotracheal intubation on 05/11/23. PCCM was consulted for help evaluation medical management after he failed BiPAP. Extubated on 6/14. Pt has had on going bradycardia for which cardiology has been consulted. CT angiogram chest was negative for PE. Head CT was negative for acute stroke.   Transferred out to [facility] on 05/14/23.  Assessment and Plan:  ?Syncope Bradycardia Recurrent PACs, no AV block. Pt is not on beta blockers.  HR 44 this morning at bedside. TSH 2.1, cortisol 3.1 -Cardiology following, patient recommendations       -Avoid AVN blocking agents       -After patient is able to ambulate more, monitor pulse rate increases with activity       -Consideration for outpatient monitoring of heart rate -PT OT -Heparin for DVT prophylaxis  Acute on chronic diastolic mild RV dysfunction Echocardiogram this admission showed EF 55-60%, no regional wall motion abnormalities, mildly reduced RV systolic function. CXR yesterday: Improving aeration in the lungs likely reflecting resolving congestive heart failure.  -Per cardiology gentle diuresis as needed  Acute on chronic respiratory failure COPD Pulmonary fibrosis  OSA At baseline oxygen status 3L. -Continue bronchodilators including budesonide, Brovana, Yupelri -Continue CPAP  Type 2 diabetes CBGs overnight 239 -336 -Monitor CBGs 4 times daily with meals and at bedtime -Sliding scale insulin -Meal coverage 5 units 3 times daily -Increase Semglee 23 units  AKI on CKD Cr 1.47 today, improved from 1.74 yesterday.  GFR 50 baseline is  1.1. -Monitor with BMP -Avoid nephrotoxic agents   History of solitary kidney  History of bladder cancer  Stable  HLD -Continue statin  BPH -Continue tamsulosin    Subjective:  Doing well. No acute concerns.   Physical Exam:  Vitals:   05/14/23 0500 05/14/23 0600 05/14/23 0700 05/14/23 0740  BP: (!) 107/42 (!) 101/43 (!) 114/52   Pulse: (!) 45 (!) 51 (!) 44   Resp: 18 18 19    Temp:    (!) 97.5 F (36.4 C)  TempSrc:    Oral  SpO2: 96% 94% 96%   Weight:      Height:        General: Alert, no acute distress, 3L [place], elderly male Cardio: Normal S1 and S2, RRR, no r/m/g Pulm: decreased air movement bilaterally Abdomen: Bowel sounds normal. Abdomen soft and non-tender.  Extremities: bilateral peripheral edema   Neuro: Cranial nerves grossly intact    Data Reviewed:  I have personally reviewed and interpreted labs, imaging.   CBC: Recent Labs  Lab 05/08/23 0418 05/10/23 0406 05/11/23 0015 05/11/23 0022 05/11/23 3295 05/11/23 0745 05/11/23 0820 05/12/23 0045 05/13/23 0133  WBC 4.5 4.3 5.6  --   --   --  4.2 8.3 6.5  NEUTROABS 2.9  --  3.7  --   --   --   --   --  4.6  HGB 12.0* 12.7* 14.1   < > 15.0 14.3 13.1 13.2 13.4  HCT 40.6 43.3 47.8   < > 44.0 42.0 44.1 43.4 45.7  MCV 99.0 99.8 100.6*  --   --   --  99.5 97.3  98.9  PLT 138* 135* 143*  --   --   --  163 189 164   < > = values in this interval not displayed.   Basic Metabolic Panel: Recent Labs  Lab 05/11/23 2124 05/12/23 0045 05/12/23 0301 05/12/23 0550 05/13/23 0133 05/14/23 0130  NA 142 141 140  --  137 136  K 4.6 5.2* 4.7 4.8 3.8 4.2  CL 102 103 102  --  94* 100  CO2 30 29 27   --  26 28  GLUCOSE 177* 250* 257*  --  209* 317*  BUN 41* 46* 50*  --  60* 49*  CREATININE 1.79* 1.91* 2.00*  --  1.74* 1.47*  CALCIUM 9.5 9.3 9.1  --  9.2 8.6*  MG 2.6* 2.5*  --   --  2.4  --   PHOS 2.6 3.8  --   --  3.9  --    GFR: Estimated Creatinine Clearance: 57.2 mL/min (A) (by C-G formula based on  SCr of 1.47 mg/dL (H)). Liver Function Tests: Recent Labs  Lab 05/11/23 0015  AST 19  ALT 19  ALKPHOS 86  BILITOT 0.4  PROT 7.3  ALBUMIN 2.9*    Coagulation Profile: Recent Labs  Lab 05/11/23 0015 05/11/23 0820  INR 1.2 1.2    Code Status:  Full code.  Code status decision has been confirmed with: patient  Family Communication:     Severity of Illness:  The appropriate patient status for this patient is INPATIENT. Inpatient status is judged to be reasonable and necessary in order to provide the required intensity of service to ensure the patient's safety. The patient's presenting symptoms, physical exam findings, and initial radiographic and laboratory data in the context of their chronic comorbidities is felt to place them at high risk for further clinical deterioration. Furthermore, it is not anticipated that the patient will be medically stable for discharge from the hospital within 2 midnights of admission.   * I certify that at the point of admission it is my clinical judgment that the patient will require inpatient hospital care spanning beyond 2 midnights from the point of admission due to high intensity of service, high risk for further deterioration and high frequency of surveillance required.*   Author:  Rolm Gala MD  05/14/2023 7:41 AM

## 2023-05-15 ENCOUNTER — Inpatient Hospital Stay (HOSPITAL_COMMUNITY): Payer: No Typology Code available for payment source

## 2023-05-15 DIAGNOSIS — R001 Bradycardia, unspecified: Secondary | ICD-10-CM | POA: Diagnosis not present

## 2023-05-15 DIAGNOSIS — J9601 Acute respiratory failure with hypoxia: Secondary | ICD-10-CM

## 2023-05-15 LAB — C-REACTIVE PROTEIN: CRP: 3.5 mg/dL — ABNORMAL HIGH (ref <1.0)

## 2023-05-15 LAB — CBC
HCT: 40.7 % (ref 39.0–52.0)
Hemoglobin: 12.5 g/dL — ABNORMAL LOW (ref 13.0–17.0)
MCH: 29.7 pg (ref 26.0–34.0)
MCHC: 30.7 g/dL (ref 30.0–36.0)
MCV: 96.7 fL (ref 80.0–100.0)
Platelets: 137 10*3/uL — ABNORMAL LOW (ref 150–400)
RBC: 4.21 MIL/uL — ABNORMAL LOW (ref 4.22–5.81)
RDW: 13.4 % (ref 11.5–15.5)
WBC: 4 10*3/uL (ref 4.0–10.5)
nRBC: 0 % (ref 0.0–0.2)

## 2023-05-15 LAB — GLUCOSE, CAPILLARY
Glucose-Capillary: 309 mg/dL — ABNORMAL HIGH (ref 70–99)
Glucose-Capillary: 316 mg/dL — ABNORMAL HIGH (ref 70–99)
Glucose-Capillary: 290 mg/dL — ABNORMAL HIGH (ref 70–99)
Glucose-Capillary: 272 mg/dL — ABNORMAL HIGH (ref 70–99)

## 2023-05-15 LAB — BASIC METABOLIC PANEL
Anion gap: 12 (ref 5–15)
BUN: 41 mg/dL — ABNORMAL HIGH (ref 8–23)
CO2: 29 mmol/L (ref 22–32)
Calcium: 9 mg/dL (ref 8.9–10.3)
Chloride: 99 mmol/L (ref 98–111)
Creatinine, Ser: 1.57 mg/dL — ABNORMAL HIGH (ref 0.61–1.24)
GFR, Estimated: 47 mL/min — ABNORMAL LOW (ref >60)
Glucose, Bld: 221 mg/dL — ABNORMAL HIGH (ref 70–99)
Potassium: 4.5 mmol/L (ref 3.5–5.1)
Sodium: 140 mmol/L (ref 135–145)

## 2023-05-15 LAB — BRAIN NATRIURETIC PEPTIDE: B Natriuretic Peptide: 72 pg/mL (ref 0.0–100.0)

## 2023-05-15 LAB — CULTURE, BLOOD (ROUTINE X 2): Culture: NO GROWTH

## 2023-05-15 LAB — MAGNESIUM: Magnesium: 2.1 mg/dL (ref 1.7–2.4)

## 2023-05-15 LAB — PROCALCITONIN: Procalcitonin: <0.1 ng/mL

## 2023-05-15 MED ORDER — INSULIN GLARGINE-YFGN 100 UNIT/ML ~~LOC~~ SOLN
25.0000 [IU] | Freq: Two times a day (BID) | SUBCUTANEOUS | Status: DC
  Administered 2023-05-15 (×2): 25 [IU] via SUBCUTANEOUS
  Filled 2023-05-15 (×4): qty 0.25

## 2023-05-15 NOTE — Progress Notes (Deleted)
   05/15/23 2004  BiPAP/CPAP/SIPAP  BiPAP/CPAP/SIPAP Pt Type Adult  Mask Type Nasal pillows  Patient Home Equipment Yes  Safety Check Completed by RT for Home Unit Yes, issues noted   Pt resting comfortably on her home unit, No distress, No WOB.  RT will continue to monitor

## 2023-05-15 NOTE — TOC Initial Note (Addendum)
Transition of Care (TOC) - Initial/Assessment Note    Patient Details  Name: Ricky PENNINGER Sr. MRN: [id number] Date of Birth: 1951-11-24  Transition of Care [facility]) CM/SW Contact:    Mearl Latin, LCSW Phone Number: 05/15/2023, 11:36 AM  Clinical Narrative:                 11:36am-CSW left voicemail for Deseree at [facility] to confirm they are able to accept patient back once stable.   2pm-CSW met with patient and he requested to return to [facility] for rehab. CSW received return call from [facility] and she will get with their DON on patient's readiness. CSW will begin insurance process tomorrow.   Expected Discharge Plan: Skilled Nursing Facility Barriers to Discharge: Continued Medical Work up, English as a second language teacher   Patient Goals and CMS Choice Patient states their goals for this hospitalization and ongoing recovery are:: Rehab          Expected Discharge Plan and Services In-house Referral: Clinical Social Work   Post Acute Care Choice: Skilled Nursing Facility Living arrangements for the past 2 months: Single Family Home                                      Prior Living Arrangements/Services Living arrangements for the past 2 months: Single Family Home Lives with:: Spouse Patient language and need for interpreter reviewed:: Yes Do you feel safe going back to the place where you live?: Yes      Need for Family Participation in Patient Care: Yes (Comment) Care giver support system in place?: Yes (comment)   Criminal Activity/Legal Involvement Pertinent to Current Situation/Hospitalization: No - Comment as needed  Activities of Daily Living      Permission Sought/Granted Permission sought to share information with : Facility Industrial/product designer granted to share information with : Yes, Verbal Permission Granted  Share Information with NAME: [name]  Permission granted to share info w AGENCY: [name]        Emotional  Assessment Appearance:: Appears stated age Attitude/Demeanor/Rapport: Engaged Affect (typically observed): Accepting, Appropriate Orientation: : Oriented to Self, Oriented to Place, Oriented to  Time, Oriented to Situation Alcohol / Substance Use: Not Applicable Psych Involvement: No (comment)  Admission diagnosis:  Acute respiratory failure with hypoxia and hypercapnia (HCC) [J96.01, J96.02] Acute hypoxic respiratory failure (HCC) [J96.01] Patient Active Problem List   Diagnosis Date Noted   Bradycardia 05/13/2023   Acute on chronic respiratory failure with hypoxia and hypercapnia (HCC) 05/11/2023   Acute hypoxic respiratory failure (HCC) 05/11/2023   Class 2 obesity 05/07/2023   CKD stage 3a, GFR 45-59 ml/min (HCC) 05/07/2023   Syncope 05/06/2023   Impaired functional mobility, balance, gait, and endurance 09/27/2018   Unilateral primary osteoarthritis, left hip 02/10/2017   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   H/O total hip arthroplasty, left 02/10/2017   History of total hip arthroplasty 02/10/2017   Respiratory failure (HCC)    OSA (obstructive sleep apnea)    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   HTN (hypertension) 10/13/2016   Anemia 03/02/2016   BPH (benign prostatic hyperplasia) 03/02/2016   Type 2 diabetes mellitus with hyperlipidemia (HCC) 03/02/2016   Migraines 03/02/2016   OAB (overactive bladder) 03/02/2016   CAD (coronary artery disease) 02/09/2015   Morbid obesity with BMI of 40.0-44.9, adult (HCC) 01/29/2015   Bladder cancer (HCC) 01/29/2015   Pain in the chest    Chronic  systolic heart failure (HCC)    Esophageal reflux    Chest pain 01/08/2015   Insulin dependent diabetes mellitus 01/08/2015   COPD (chronic obstructive pulmonary disease) (HCC) 01/08/2015   Hyperlipidemia associated with type 2 diabetes mellitus (HCC) 01/08/2015   PCP:  [name], MD Pharmacy:   [facility] [place], [place] - [address] [place] [address] Phone: [phone] Fax: [phone]  [facility] - [place], [place] - [address] [place] [address] Phone: [phone] Fax: [phone]     Social Determinants of Health (SDOH) Social History: SDOH Screenings   Food Insecurity: No Food Insecurity (05/06/2023)  Housing: Low Risk  (05/06/2023)  Transportation Needs: No Transportation Needs (05/06/2023)  Utilities: Patient Declined (05/07/2023)  Depression (PHQ2-9): Low Risk  (11/08/2021)  Tobacco Use: Medium Risk (05/06/2023)   SDOH Interventions:     Readmission Risk Interventions     No data to display

## 2023-05-15 NOTE — Progress Notes (Signed)
Physical Therapy Treatment Patient Details Name: Ricky COUSIN Sr. MRN: [id number] DOB: 08/18/1951 Today's Date: 05/15/2023   History of Present Illness Pt is 72 year old presented to [facility] on  05/11/23 for acute on chronic respiratory failure due to copd exacerbation and acute CHF. Pt intubated. Extubated 6/14. PMH - COPD, HTN, DM, bladder CA, UE DVT, CHF, obesity, THR    PT Comments    Pt greeted up in chair on arrival pleasant and agreeable to session. Pt continues to be limited by bil foot pain, limiting pt standing tolerance, weakness, poor balance/postural reactions and decreased activity tolerance. Pt requiring mod A to come to stand from recliner with RW for support and cues for hand placement, pt unable to take steps away from chair or complete pre-gait activities due to pain. Pt needing up to mod A to maintain standing balance with single UE support down to min A with bil UE on RW. Pt with good tolerance for seated LE therex with cues for technique. Current plan remains appropriate to address deficits and maximize functional independence and decrease caregiver burden. Pt continues to benefit from skilled PT services to progress toward functional mobility goals.    Recommendations for follow up therapy are one component of a multi-disciplinary discharge planning process, led by the attending physician.  Recommendations may be updated based on patient status, additional functional criteria and insurance authorization.  Follow Up Recommendations  Can patient physically be transported by private vehicle: No    Assistance Recommended at Discharge Frequent or constant Supervision/Assistance  Patient can return home with the following A lot of help with walking and/or transfers;A lot of help with bathing/dressing/bathroom;Assistance with cooking/housework;Assist for transportation;Help with stairs or ramp for entrance   Equipment Recommendations  Other (comment) (To be determined at next  venue)    Recommendations for Other Services       Precautions / Restrictions Precautions Precautions: Fall Restrictions Weight Bearing Restrictions: No     Mobility  Bed Mobility Overal bed mobility: Needs Assistance             General bed mobility comments: pt up in chair on arrival    Transfers Overall transfer level: Needs assistance Equipment used: Rolling walker (2 wheels) Transfers: Sit to/from Stand Sit to Stand: Mod assist           General transfer comment: mod A needed to power up from recliner, cues to scoot out to edge prior to stand and cues for rocking technique to generate anterior trasnlation, pt with flexed posture in standing needing mod A to maintain balance with single UE support on RW for BP reading, pt declining further attempts or attempts at taking steps due to bil foot pain    Ambulation/Gait                   Stairs             Wheelchair Mobility    Modified Rankin (Stroke Patients Only)       Balance Overall balance assessment: Needs assistance Sitting-balance support: Bilateral upper extremity supported, Feet supported Sitting balance-Leahy Scale: Poor Sitting balance - Comments: UE support   Standing balance support: Bilateral upper extremity supported, During functional activity, Reliant on assistive device for balance Standing balance-Leahy Scale: Poor Standing balance comment: min-mod A for static standing with BUE and single UE support respectively  Cognition Arousal/Alertness: Awake/alert Behavior During Therapy: WFL for tasks assessed/performed Overall Cognitive Status: Within Functional Limits for tasks assessed                                 General Comments: pt pleasant and cooperative throughout session        Exercises General Exercises - Lower Extremity Long Arc Quad: AROM, Right, Left, 10 reps, Seated (with hold at end range) Hip  Flexion/Marching: AAROM, Both, 20 reps, Seated    General Comments General comments (skin integrity, edema, etc.): VSS on supplemental O2, BP seated 112/54, Standing 134/98, seated at end of session 107/57, pt unable to maintain standing for 3 mins for subsequent reading. pt asymptomatic throughout      Pertinent Vitals/Pain Pain Assessment Pain Assessment: Faces Faces Pain Scale: Hurts even more Pain Location: Bilateral feet Pain Descriptors / Indicators: Sore, Discomfort, Grimacing Pain Intervention(s): Monitored during session, Limited activity within patient's tolerance    Home Living                          Prior Function            PT Goals (current goals can now be found in the care plan section) Acute Rehab PT Goals Patient Stated Goal: return home PT Goal Formulation: With patient Time For Goal Achievement: 05/27/23 Progress towards PT goals: Progressing toward goals    Frequency    Min 3X/week      PT Plan Current plan remains appropriate    Co-evaluation              AM-PAC PT "6 Clicks" Mobility   Outcome Measure  Help needed turning from your back to your side while in a flat bed without using bedrails?: A Lot Help needed moving from lying on your back to sitting on the side of a flat bed without using bedrails?: A Lot Help needed moving to and from a bed to a chair (including a wheelchair)?: Total Help needed standing up from a chair using your arms (e.g., wheelchair or bedside chair)?: A Lot Help needed to walk in hospital room?: A Lot Help needed climbing 3-5 steps with a railing? : Total 6 Click Score: 10    End of Session   Activity Tolerance: Patient tolerated treatment well;Patient limited by pain Patient left: in chair;with call bell/phone within reach;with chair alarm set Nurse Communication: Mobility status;Need for lift equipment PT Visit Diagnosis: Unsteadiness on feet (R26.81);Other abnormalities of gait and mobility  (R26.89);Muscle weakness (generalized) (M62.81)     Time: 1610-9604 PT Time Calculation (min) (ACUTE ONLY): 23 min  Charges:  $Therapeutic Activity: 23-37 mins                     Ricky Tagliaferro R. PTA Acute Rehabilitation Services Office: [phone]   Catalina Antigua 05/15/2023, 10:46 AM

## 2023-05-15 NOTE — Progress Notes (Signed)
Rounding Note    Patient Name: Ricky LIGOCKI Sr. Date of Encounter: 05/15/2023  [facility] Cardiologist: Carolan Clines MD  Subjective   Pt denies CP or dyspnea  Inpatient Medications    Scheduled Meds:  arformoterol  15 mcg Nebulization BID   atorvastatin  80 mg Oral QHS   budesonide (PULMICORT) nebulizer solution  0.5 mg Nebulization BID   Chlorhexidine Gluconate Cloth  6 each Topical Daily   ferrous sulfate  325 mg Oral Daily   gabapentin  300 mg Oral Q12H   heparin  5,000 Units Subcutaneous Q8H   insulin aspart  0-20 Units Subcutaneous TID AC & HS   insulin aspart  5 Units Subcutaneous TID WC   insulin glargine-yfgn  23 Units Subcutaneous QHS   revefenacin  175 mcg Nebulization Daily   tamsulosin  0.4 mg Oral QHS   Continuous Infusions:  sodium chloride Stopped (05/12/23 0414)   PRN Meds: docusate sodium, ipratropium-albuterol, mouth rinse, phenol, polyethylene glycol   Vital Signs    Vitals:   05/15/23 0318 05/15/23 0500 05/15/23 0512 05/15/23 0755  BP: (!) 99/43  112/60 (!) 101/50  Pulse: 62  (!) 53   Resp: 18  15   Temp: 98.6 F (37 C)   97.6 F (36.4 C)  TempSrc: Oral   Oral  SpO2: 90%  92%   Weight:  121 kg    Height:        Intake/Output Summary (Last 24 hours) at 05/15/2023 0814 Last data filed at 05/15/2023 0756 Gross per 24 hour  Intake 840 ml  Output 1900 ml  Net -1060 ml      05/15/2023    5:00 AM 05/14/2023    3:06 AM 05/12/2023    5:00 AM  Last 3 Weights  Weight (lbs) 266 lb 12.1 oz 264 lb 5.3 oz 264 lb 1.8 oz  Weight (kg) 121 kg 119.9 kg 119.8 kg      Telemetry    Sinus bradycardia with PACs and PVCs - Personally Reviewed    Physical Exam   GEN: No acute distress.   Neck: No JVD Cardiac: RRR, no murmurs, rubs, or gallops.  Respiratory: Clear to auscultation bilaterally. GI: Soft, nontender, non-distended  MS: No edema Neuro:  Nonfocal  Psych: Normal affect   Labs    High Sensitivity Troponin:   Recent  Labs  Lab 05/06/23 1845 05/06/23 1943 05/11/23 0120 05/11/23 0300  TROPONINIHS 10 10 15 13      Chemistry Recent Labs  Lab 05/11/23 0015 05/11/23 0022 05/12/23 0045 05/12/23 0301 05/13/23 0133 05/14/23 0130 05/15/23 0330 05/15/23 0629  NA 140   < > 141   < > 137 136 140  --   K 4.7   < > 5.2*   < > 3.8 4.2 4.5  --   CL 99   < > 103   < > 94* 100 99  --   CO2 31   < > 29   < > 26 28 29   --   GLUCOSE 233*   < > 250*   < > 209* 317* 221*  --   BUN 23   < > 46*   < > 60* 49* 41*  --   CREATININE 1.39*   < > 1.91*   < > 1.74* 1.47* 1.57*  --   CALCIUM 9.3   < > 9.3   < > 9.2 8.6* 9.0  --   MG  --    < >  2.5*  --  2.4  --   --  2.1  PROT 7.3  --   --   --   --   --   --   --   ALBUMIN 2.9*  --   --   --   --   --   --   --   AST 19  --   --   --   --   --   --   --   ALT 19  --   --   --   --   --   --   --   ALKPHOS 86  --   --   --   --   --   --   --   BILITOT 0.4  --   --   --   --   --   --   --   GFRNONAA 54*   < > 37*   < > 41* 50* 47*  --   ANIONGAP 10   < > 9   < > 17* 8 12  --    < > = values in this interval not displayed.     Hematology Recent Labs  Lab 05/12/23 0045 05/13/23 0133 05/15/23 0330  WBC 8.3 6.5 4.0  RBC 4.46 4.62 4.21*  HGB 13.2 13.4 12.5*  HCT 43.4 45.7 40.7  MCV 97.3 98.9 96.7  MCH 29.6 29.0 29.7  MCHC 30.4 29.3* 30.7  RDW 13.8 14.1 13.4  PLT 189 164 137*   Thyroid  Recent Labs  Lab 05/14/23 0130  TSH 2.197    BNP Recent Labs  Lab 05/11/23 0120 05/15/23 0629  BNP 32.9 72.0     Radiology    DG Chest Port 1 View  Result Date: 05/15/2023 CLINICAL DATA:  Shortness of breath. EXAM: PORTABLE CHEST 1 VIEW COMPARISON:  05/13/2023 FINDINGS: The lungs are clear without focal pneumonia, edema, pneumothorax or pleural effusion. Interstitial markings are diffusely prominent. The cardio pericardial silhouette is enlarged. No acute bony abnormality. Telemetry leads overlie the chest. IMPRESSION: Diffuse interstitial prominence, likely  chronic although component of interstitial edema not excluded. Electronically Signed   By: Kennith Center M.D.   On: 05/15/2023 06:38      Patient Profile     [name] is a 72 y/o male with hx of COPD, DM2, nonobstructive CAD who p/w somnolence / ? Syncope and acute hypercarbic respiratory failure s/p intubatio for whom cardiology was consulted.  Echocardiogram this admission shows ejection fraction 55 to 60%, mild RV dysfunction.  Assessment & Plan   1 question syncope-Not clear pt had syncope at time of admission; he was noted to be bradycardic on telemetry but no clear association with symptoms.  LV function is normal on echocardiogram.  Agree with ambulating and following heart rate.  Will also arrange 2-week Zio patch as an outpatient.  Avoid AV nodal blocking agents.  Otherwise no further cardiac workup indicated.  2 bradycardia-plan as outlined in the number 1.  3 COPD/respiratory failure-per primary service.  Patient to be discharged from a cardiac standpoint with plan as outlined above.  We will arrange follow-up with APP 2 to 4 weeks after discharge.  For questions or updates, please contact  [facility] Please consult [email or web address] for contact info under        Signed, Olga Millers, MD  05/15/2023, 8:14 AM

## 2023-05-15 NOTE — Progress Notes (Signed)
   05/15/23 2200  BiPAP/CPAP/SIPAP  $ Non-Invasive Home Ventilator  Subsequent  BiPAP/CPAP/SIPAP Pt Type Adult  BiPAP/CPAP/SIPAP Resmed  Mask Type Nasal pillows  Flow Rate 3 lpm  Safety Check Completed by RT for Home Unit Yes, no issues noted   Pt self administers Home CPAP and needs no assistance at this time.

## 2023-05-15 NOTE — Inpatient Diabetes Management (Signed)
Inpatient Diabetes Program Recommendations  AACE/ADA: New Consensus Statement on Inpatient Glycemic Control (2015)  Target Ranges:  Prepandial:   less than 140 mg/dL      Peak postprandial:   less than 180 mg/dL (1-2 hours)      Critically ill patients:  140 - 180 mg/dL   Lab Results  Component Value Date   GLUCAP 309 (H) 05/15/2023   HGBA1C 8.6 (H) 05/11/2023    Review of Glycemic Control  Latest Reference Range & Units 05/14/23 07:33 05/14/23 11:27 05/14/23 16:00 05/14/23 21:31 05/15/23 07:54  Glucose-Capillary 70 - 99 mg/dL 829 (H)  Novolog 12 units 277 (H)  Novolog 16 units 285 (H)  Novolgo 16 units 227 (H)  Novolog 7 units 309 (H)   Diabetes history: DM 2 Outpatient Diabetes medications: Concentrated Humulin R U-500 30-60-60, Jardiance 25 mg Daily (ordered has not started yet) Current orders for Inpatient glycemic control:  Semglee 23 units qhs Novolog 0-20 units tid and hs Novolog 5 units tid meal coverage  A1c 8.6% on 6/13  Inpatient Diabetes Program Recommendations:    Note: pt on concentrated insulin at home. From SNF.  -  Consider increasing Semglee to 33 units -  Consider increasing Novolog meal coverage to 10 units  MD to assess during rounds.  Thanks,  Christena Deem RN, MSN, BC-ADM Inpatient Diabetes Coordinator Team Pager [phone] (8a-5p)

## 2023-05-15 NOTE — Progress Notes (Signed)
PROGRESS NOTE                                                                                                                                                                                                             Patient Demographics:    Ricky Johnston, is a 72 y.o. male, DOB - 08-May-1951, UJW:[id number]  Outpatient Primary MD for the patient is Ricky Pink, MD    LOS - 4  Admit date - 05/10/2023    Chief Complaint  Patient presents with   Respiratory Distress    Patient to ED via EMS from [facility] with complaint of difficulty breathing. EMS reports patient was 84% on 3 liters. Patient uses 3 liters nasal canula O2 at baseline. EMS gave 125 mg solu medrol and 2 grams magnesium sulfate through and 18 in left bicep. Patient arrives with duo neb treatment being administered.       Brief Narrative (HPI from H&P)    72 year old male with known history of COPD on 3 to 4 L home oxygen at all times, OSA on nocturnal BiPAP, history of bladder cancer, BPH, GERD, CKD 3B with baseline creatinine around 1.5, history of DVT in the remote past, GERD, dyslipidemia, hypertension who was recently admitted to [facility] initially on 05/06/2023 for syncope, he had a thorough workup and was subsequently discharged to a nursing home on 11/08/2023.   Resented a day later again to [facility] from the nursing home with respiratory distress, he was found to be in CHF, he was intubated and transferred to [facility], ICU, he developed some bradycardia in ICU, he was also seen by cardiology.  Overall stabilized and transferred to hospitalist service under my care on 05/15/2023.     Subjective:    Ricky Johnston, No headache, No chest pain, No abdominal pain - No Nausea, No new weakness tingling or numbness, no SOB   Assessment  & Plan :    Acute on chronic hypoxic and hypercapnic respiratory failure  thought to be due to combination of acute on chronic diastolic CHF, chronic underlying pulmonary fibrosis, possible mild COPD exacerbation and underlying OSA.  He required endotracheal intubation, admission in ICU, was diuresed, eventually stabilized transition back to 3 to 4 L nasal cannula oxygen in daytime which is his baseline.  CHF  and COPD exacerbation seem to have resolved.  Continue to monitor.  Advance activity.  Obesity.  BMI 40 with OSA.  Nightly CPAP, follow-up with PCP for weight loss.  Syncope last admission, now bradycardia since he was in ICU.  Initially thought to be due to propofol, stable TSH, EKG stable, echo stable, troponin negative, not on any rate controlling agents.  Could be effect of propofol which is longer than expected due to underlying renal insufficiency, will check for chronotropic response, appreciate cardiology input.  Monitor.  Acute on chronic diastolic mild RV dysfunction EF.  60%.  Currently see #1 above.  Cardiology following.  History of chronic hypoxic respiratory failure, COPD, pulmonary fibrosis.  OSA.  On 3 to 4 L of nasal cannula oxygen at baseline which will be continued, nighttime CPAP, continue bronchodilators including budesonide, Brovana, Yupelri, close to baseline now.   AKI on CKD 3B  - Cr baseline 1.5, AKI resolved close to baseline now.   History of solitary kidney  History of bladder cancer -  Stable   HLD  -Continue statin   BPH  -Continue tamsulosin   Type 2 diabetes - Semglee and sliding scale along with Premeal NovoLog.  Dose adjusted on 05/15/2023 for better control.  Poor outpatient control due to hyperglycemia as suggested by elevated A1c.  Lab Results  Component Value Date   HGBA1C 8.6 (H) 05/11/2023   CBG (last 3)  Recent Labs    05/14/23 1600 05/14/23 2131 05/15/23 0754  GLUCAP 285* 227* 309*        Condition - Extremely Guarded  Family Communication  :  None present  Code Status :  Full  Consults  :  PCCM,  Cards  PUD Prophylaxis :    Procedures  :     05/11/2023 intubated per [facility] EDP 6/14 extubated    CTA.  No PE.    Head CT.  Nonacute.    TTE -  1. Left ventricular ejection fraction, by estimation, is 55 to 60%. The left ventricle Johnston normal function. The left ventricle Johnston no regional wall motion abnormalities. Left ventricular diastolic parameters are indeterminate.  2. Right ventricular systolic function is mildly reduced. The right ventricular size is normal.  3. The mitral valve is normal in structure. No evidence of mitral valve regurgitation. No evidence of mitral stenosis. Moderate mitral annular calcification.  4. The aortic valve was not well visualized. There is moderate calcification of the aortic valve. Aortic valve regurgitation is not visualized. No aortic stenosis is present.  5. The inferior vena cava is dilated in size with <50% respiratory variability, suggesting right atrial pressure of 15 mmHg.  6. Very limited study due to poor sound wave transmission.      Disposition Plan  :    Status is: Inpatient  DVT Prophylaxis  :    Place TED hose Start: 05/15/23 0844 heparin injection 5,000 Units Start: 05/11/23 1400 SCDs Start: 05/11/23 0803    Lab Results  Component Value Date   PLT 137 (L) 05/15/2023    Diet :  Diet Order             Diet regular Room service appropriate? Yes; Fluid consistency: Thin  Diet effective now                    Inpatient Medications  Scheduled Meds:  arformoterol  15 mcg Nebulization BID   atorvastatin  80 mg Oral QHS   budesonide (PULMICORT) nebulizer solution  0.5  mg Nebulization BID   Chlorhexidine Gluconate Cloth  6 each Topical Daily   ferrous sulfate  325 mg Oral Daily   gabapentin  300 mg Oral Q12H   heparin  5,000 Units Subcutaneous Q8H   insulin aspart  0-20 Units Subcutaneous TID AC & HS   insulin aspart  5 Units Subcutaneous TID WC   insulin glargine-yfgn  25 Units Subcutaneous BID   revefenacin  175 mcg  Nebulization Daily   tamsulosin  0.4 mg Oral QHS   Continuous Infusions:  sodium chloride Stopped (05/12/23 0414)   PRN Meds:.docusate sodium, ipratropium-albuterol, mouth rinse, phenol, polyethylene glycol    Objective:   Vitals:   05/15/23 0318 05/15/23 0500 05/15/23 0512 05/15/23 0755  BP: (!) 99/43  112/60 (!) 101/50  Pulse: 62  (!) 53   Resp: 18  15   Temp: 98.6 F (37 C)   97.6 F (36.4 C)  TempSrc: Oral   Oral  SpO2: 90%  92%   Weight:  121 kg    Height:        Wt Readings from Last 3 Encounters:  05/15/23 121 kg  05/06/23 118.8 kg  11/04/21 125 kg     Intake/Output Summary (Last 24 hours) at 05/15/2023 0844 Last data filed at 05/15/2023 0756 Gross per 24 hour  Intake 360 ml  Output 1900 ml  Net -1540 ml     Physical Exam  Awake Alert, No new F.N deficits, Normal affect [place].AT,PERRAL Supple Neck, No JVD,   Symmetrical Chest wall movement, Good air movement bilaterally, CTAB RRR,No Gallops,Rubs or new Murmurs,  +ve B.Sounds, Abd Soft, No tenderness,   No Cyanosis, Clubbing or edema      Data Review:    Recent Labs  Lab 05/11/23 0015 05/11/23 0022 05/11/23 0745 05/11/23 0820 05/12/23 0045 05/13/23 0133 05/15/23 0330  WBC 5.6  --   --  4.2 8.3 6.5 4.0  HGB 14.1   < > 14.3 13.1 13.2 13.4 12.5*  HCT 47.8   < > 42.0 44.1 43.4 45.7 40.7  PLT 143*  --   --  163 189 164 137*  MCV 100.6*  --   --  99.5 97.3 98.9 96.7  MCH 29.7  --   --  29.6 29.6 29.0 29.7  MCHC 29.5*  --   --  29.7* 30.4 29.3* 30.7  RDW 13.7  --   --  13.7 13.8 14.1 13.4  LYMPHSABS 1.2  --   --   --   --  1.1  --   MONOABS 0.5  --   --   --   --  0.6  --   EOSABS 0.1  --   --   --   --  0.1  --   BASOSABS 0.0  --   --   --   --  0.0  --    < > = values in this interval not displayed.    Recent Labs  Lab 05/11/23 0015 05/11/23 0022 05/11/23 0120 05/11/23 0322 05/11/23 4098 05/11/23 0745 05/11/23 0820 05/11/23 2124 05/12/23 0045 05/12/23 0301 05/12/23 0550  05/13/23 0133 05/14/23 0130 05/15/23 0330 05/15/23 0629  NA 140   < >  --    < > 138   < >  --  142 141 140  --  137 136 140  --   K 4.7   < >  --    < > 5.7*   < >  --  4.6 5.2*  4.7 4.8 3.8 4.2 4.5  --   CL 99  --   --   --  100  --   --  102 103 102  --  94* 100 99  --   CO2 31  --   --   --  25  --   --  30 29 27   --  26 28 29   --   ANIONGAP 10  --   --   --  13  --   --  10 9 11   --  17* 8 12  --   GLUCOSE 233*  --   --   --  408*  --   --  177* 250* 257*  --  209* 317* 221*  --   BUN 23  --   --   --  32*  --   --  41* 46* 50*  --  60* 49* 41*  --   CREATININE 1.39*  --   --   --  1.45*  --   --  1.79* 1.91* 2.00*  --  1.74* 1.47* 1.57*  --   AST 19  --   --   --   --   --   --   --   --   --   --   --   --   --   --   ALT 19  --   --   --   --   --   --   --   --   --   --   --   --   --   --   ALKPHOS 86  --   --   --   --   --   --   --   --   --   --   --   --   --   --   BILITOT 0.4  --   --   --   --   --   --   --   --   --   --   --   --   --   --   ALBUMIN 2.9*  --   --   --   --   --   --   --   --   --   --   --   --   --   --   CRP  --   --   --   --   --   --   --   --   --   --   --   --   --   --  3.5*  PROCALCITON  --   --   --   --   --   --  0.17  --   --   --   --   --   --   --  <0.10  LATICACIDVEN 0.7  --   --   --  1.8  --   --   --   --   --   --   --   --   --   --   INR 1.2  --   --   --   --   --  1.2  --   --   --   --   --   --   --   --   TSH  --   --   --   --   --   --   --   --   --   --   --   --  2.197  --   --   HGBA1C  --   --   --   --   --   --  8.6*  --   --   --   --   --   --   --   --   BNP  --   --  32.9  --   --   --   --   --   --   --   --   --   --   --  72.0  MG  --   --   --   --   --   --   --  2.6* 2.5*  --   --  2.4  --   --  2.1  CALCIUM 9.3  --   --   --  8.9  --   --  9.5 9.3 9.1  --  9.2 8.6* 9.0  --    < > = values in this interval not displayed.      Recent Labs  Lab 05/11/23 0015 05/11/23 0120 05/11/23 0653  05/11/23 0820 05/11/23 2124 05/12/23 0045 05/12/23 0301 05/13/23 0133 05/14/23 0130 05/15/23 0330 05/15/23 0629  CRP  --   --   --   --   --   --   --   --   --   --  3.5*  PROCALCITON  --   --   --  0.17  --   --   --   --   --   --  <0.10  LATICACIDVEN 0.7  --  1.8  --   --   --   --   --   --   --   --   INR 1.2  --   --  1.2  --   --   --   --   --   --   --   TSH  --   --   --   --   --   --   --   --  2.197  --   --   HGBA1C  --   --   --  8.6*  --   --   --   --   --   --   --   BNP  --  32.9  --   --   --   --   --   --   --   --  72.0  MG  --   --   --   --  2.6* 2.5*  --  2.4  --   --  2.1  CALCIUM 9.3  --  8.9  --  9.5 9.3 9.1 9.2 8.6* 9.0  --     Recent Labs    05/14/23 0130  TSH 2.197    Radiology Reports DG Chest Port 1 View  Result Date: 05/15/2023 CLINICAL DATA:  Shortness of breath. EXAM: PORTABLE CHEST 1 VIEW COMPARISON:  05/13/2023 FINDINGS: The lungs are clear without focal pneumonia, edema, pneumothorax or pleural effusion. Interstitial markings are diffusely prominent. The cardio pericardial silhouette is enlarged. No acute bony abnormality. Telemetry leads overlie the chest. IMPRESSION: Diffuse interstitial prominence, likely chronic although component of interstitial edema not excluded. Electronically Signed   By: Kennith Johnston M.D.   On: 05/15/2023 06:38   DG Chest Port 1 View  Result Date: 05/13/2023 CLINICAL DATA:  72 year old male with history of shortness of breath  following extubation. EXAM: PORTABLE CHEST 1 VIEW COMPARISON:  Chest x-ray 05/11/2023. FINDINGS: Patient Johnston been extubated. Previously noted nasogastric tube Johnston also been removed. Lung volumes are normal. Patchy areas of interstitial prominence, widespread peribronchial cuffing and patchy ill-defined opacities are again noted in the lungs, most evident in the periphery of the left lung base at the costophrenic sulcus. Overall, aeration appears slightly improved. No definite pleural effusions.  Mild cephalization of the pulmonary vasculature. Mild cardiomegaly. The patient is rotated to the right on today's exam, resulting in distortion of the mediastinal contours and reduced diagnostic sensitivity and specificity for mediastinal pathology. IMPRESSION: 1. Improving aeration in the lungs likely reflecting resolving congestive heart failure. Electronically Signed   By: Trudie Reed M.D.   On: 05/13/2023 07:35   ECHOCARDIOGRAM COMPLETE  Result Date: 05/11/2023    ECHOCARDIOGRAM REPORT   Patient Name:   KEVIS OTTAVIANI Sr. Date of Exam: 05/11/2023 Medical Rec #:  [id number]          Height:       68.0 in Accession #:    [id number]         Weight:       264.8 lb Date of Birth:  November 04, 1951          BSA:          2.303 m Patient Age:    72 years           BP:           112/60 mmHg Patient Gender: M                  HR:           51 bpm. Exam Location:  Inpatient Procedure: 2D Echo, Color Doppler, Cardiac Doppler and Intracardiac            Opacification Agent Indications:    Shock  History:        Patient Johnston no prior history of Echocardiogram examinations.                 CAD, COPD and CKD, Signs/Symptoms:Syncope; Risk Factors:Sleep                 Apnea, Dyslipidemia, Hypertension and Diabetes.  Sonographer:    Milbert Coulter Referring Phys: [id number] WILLIAM S MINOR  Sonographer Comments: Echo performed with patient supine and on artificial respirator, patient is obese, suboptimal apical window and suboptimal parasternal window. Image acquisition challenging due to COPD. IMPRESSIONS  1. Left ventricular ejection fraction, by estimation, is 55 to 60%. The left ventricle Johnston normal function. The left ventricle Johnston no regional wall motion abnormalities. Left ventricular diastolic parameters are indeterminate.  2. Right ventricular systolic function is mildly reduced. The right ventricular size is normal.  3. The mitral valve is normal in structure. No evidence of mitral valve regurgitation. No evidence of mitral  stenosis. Moderate mitral annular calcification.  4. The aortic valve was not well visualized. There is moderate calcification of the aortic valve. Aortic valve regurgitation is not visualized. No aortic stenosis is present.  5. The inferior vena cava is dilated in size with <50% respiratory variability, suggesting right atrial pressure of 15 mmHg.  6. Very limited study due to poor sound wave transmission. FINDINGS  Left Ventricle: Left ventricular ejection fraction, by estimation, is 55 to 60%. The left ventricle Johnston normal function. The left ventricle Johnston no regional wall motion abnormalities. Definity contrast agent was given IV to delineate the left  ventricular  endocardial borders. The left ventricular internal cavity size was normal in size. There is no left ventricular hypertrophy. Left ventricular diastolic parameters are indeterminate. Right Ventricle: The right ventricular size is normal. No increase in right ventricular wall thickness. Right ventricular systolic function is mildly reduced. Left Atrium: Left atrial size was normal in size. Right Atrium: Right atrial size was normal in size. Pericardium: There is no evidence of pericardial effusion. Mitral Valve: The mitral valve is normal in structure. Moderate mitral annular calcification. No evidence of mitral valve regurgitation. No evidence of mitral valve stenosis. Tricuspid Valve: The tricuspid valve is not well visualized. Tricuspid valve regurgitation is trivial. No evidence of tricuspid stenosis. Aortic Valve: The aortic valve was not well visualized. There is moderate calcification of the aortic valve. Aortic valve regurgitation is not visualized. No aortic stenosis is present. Aortic valve mean gradient measures 5.0 mmHg. Aortic valve peak gradient measures 9.5 mmHg. Pulmonic Valve: The pulmonic valve was not well visualized. Pulmonic valve regurgitation is not visualized. No evidence of pulmonic stenosis. Aorta: The aortic root is normal in  size and structure. Venous: The inferior vena cava is dilated in size with less than 50% respiratory variability, suggesting right atrial pressure of 15 mmHg. IAS/Shunts: No atrial level shunt detected by color flow Doppler.  LEFT VENTRICLE PLAX 2D LVIDd:         5.90 cm Diastology LVIDs:         3.90 cm LV e' medial:    6.20 cm/s LV PW:         0.90 cm LV E/e' medial:  10.3 LV IVS:        0.90 cm LV e' lateral:   9.90 cm/s                        LV E/e' lateral: 6.5  RIGHT VENTRICLE RV S prime:     13.50 cm/s TAPSE (M-mode): 1.9 cm AORTIC VALVE AV Vmax:           154.00 cm/s AV Vmean:          97.900 cm/s AV VTI:            0.276 m AV Peak Grad:      9.5 mmHg AV Mean Grad:      5.0 mmHg LVOT Vmax:         105.00 cm/s LVOT Vmean:        66.600 cm/s LVOT VTI:          0.212 m LVOT/AV VTI ratio: 0.77 MITRAL VALVE MV Area (PHT): 2.76 cm    SHUNTS MV Decel Time: 275 msec    Systemic VTI: 0.21 m MV E velocity: 63.90 cm/s MV A velocity: 68.70 cm/s MV E/A ratio:  0.93 Arvilla Meres MD Electronically signed by Arvilla Meres MD Signature Date/Time: 05/11/2023/3:26:19 PM    Final       Signature  -   Susa Raring M.D on 05/15/2023 at 8:44 AM   -  To page go to [email or web address]

## 2023-05-15 NOTE — NC FL2 (Signed)
[place] MEDICAID FL2 LEVEL OF CARE FORM     IDENTIFICATION  Patient Name: Ricky DUERKSEN Sr. Birthdate: 01-05-1951 Sex: male Admission Date (Current Location): 05/10/2023  [facility] and [place] Number:  [employer] and Address:  [facility] [place], [address] [place], [place] [address]      Provider Number: [id number]  Attending Physician Name and Address:  Leroy Sea, MD  Relative Name and Phone Number:  Cher, [facility]) [phone]    Current Level of Care: Hospital Recommended Level of Care: Skilled Nursing Facility Prior Approval Number:    Date Approved/Denied:   PASRR Number: [id number]  Discharge Plan: SNF    Current Diagnoses: Patient Active Problem List   Diagnosis Date Noted   Bradycardia 05/13/2023   Acute on chronic respiratory failure with hypoxia and hypercapnia (HCC) 05/11/2023   Acute hypoxic respiratory failure (HCC) 05/11/2023   Class 2 obesity 05/07/2023   CKD stage 3a, GFR 45-59 ml/min (HCC) 05/07/2023   Syncope 05/06/2023   Impaired functional mobility, balance, gait, and endurance 09/27/2018   Unilateral primary osteoarthritis, left hip 02/10/2017   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   H/O total hip arthroplasty, left 02/10/2017   History of total hip arthroplasty 02/10/2017   Respiratory failure (HCC)    OSA (obstructive sleep apnea)    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   HTN (hypertension) 10/13/2016   Anemia 03/02/2016   BPH (benign prostatic hyperplasia) 03/02/2016   Type 2 diabetes mellitus with hyperlipidemia (HCC) 03/02/2016   Migraines 03/02/2016   OAB (overactive bladder) 03/02/2016   CAD (coronary artery disease) 02/09/2015   Morbid obesity with BMI of 40.0-44.9, adult (HCC) 01/29/2015   Bladder cancer (HCC) 01/29/2015   Pain in the chest    Chronic systolic heart failure (HCC)    Esophageal reflux    Chest pain 01/08/2015   Insulin dependent diabetes mellitus  01/08/2015   COPD (chronic obstructive pulmonary disease) (HCC) 01/08/2015   Hyperlipidemia associated with type 2 diabetes mellitus (HCC) 01/08/2015    Orientation RESPIRATION BLADDER Height & Weight     Self, Time, Situation, Place  O2 (3L nasal cannula) Continent, External catheter Weight: 266 lb 12.1 oz (121 kg) Height:  5\' 8"  (172.7 cm)  BEHAVIORAL SYMPTOMS/MOOD NEUROLOGICAL BOWEL NUTRITION STATUS      Continent Diet (See DC Summary)  AMBULATORY STATUS COMMUNICATION OF NEEDS Skin   Limited Assist Verbally Normal                       Personal Care Assistance Level of Assistance  Bathing, Feeding, Dressing Bathing Assistance: Limited assistance Feeding assistance: Independent Dressing Assistance: Limited assistance     Functional Limitations Info             SPECIAL CARE FACTORS FREQUENCY  PT (By licensed PT), OT (By licensed OT)     PT Frequency: 5x/week OT Frequency: 5x/week            Contractures Contractures Info: Not present    Additional Factors Info  Code Status, Allergies, Insulin Sliding Scale Code Status Info: Full Allergies Info: Poison [employer] Extract (Poison Ivy Extract), Poison Oak Extract   Insulin Sliding Scale Info: See dc summary       Current Medications (05/15/2023):  This is the current hospital active medication list Current Facility-Administered Medications  Medication Dose Route Frequency Provider Last Rate Last Admin   0.9 %  sodium chloride infusion   Intravenous Continuous Minor, Chrissie Noa  S, NP   Stopped at 05/12/23 0414   arformoterol (BROVANA) nebulizer solution 15 mcg  15 mcg Nebulization BID Oretha Milch, MD   15 mcg at 05/15/23 0726   atorvastatin (LIPITOR) tablet 80 mg  80 mg Oral QHS Cathleen Corti, MD   80 mg at 05/14/23 2056   budesonide (PULMICORT) nebulizer solution 0.5 mg  0.5 mg Nebulization BID Oretha Milch, MD   0.5 mg at 05/15/23 0726   Chlorhexidine Gluconate Cloth 2 % PADS 6 each  6 each Topical  Daily Cheri Fowler, MD   6 each at 05/15/23 0925   docusate sodium (COLACE) capsule 100 mg  100 mg Oral BID PRN Quenton Fetter, RPH       ferrous sulfate tablet 325 mg  325 mg Oral Daily Cathleen Corti, MD   325 mg at 05/15/23 1610   gabapentin (NEURONTIN) capsule 300 mg  300 mg Oral Q12H Oretha Milch, MD   300 mg at 05/15/23 0936   heparin injection 5,000 Units  5,000 Units Subcutaneous Q8H Minor, Vilinda Blanks, NP   5,000 Units at 05/15/23 1259   insulin aspart (novoLOG) injection 0-20 Units  0-20 Units Subcutaneous TID AC & HS Bowser, Kaylyn Layer, NP   15 Units at 05/15/23 1259   insulin aspart (novoLOG) injection 5 Units  5 Units Subcutaneous TID WC Lanier Clam, NP   5 Units at 05/15/23 1259   insulin glargine-yfgn (SEMGLEE) injection 25 Units  25 Units Subcutaneous BID Leroy Sea, MD   25 Units at 05/15/23 0937   ipratropium-albuterol (DUONEB) 0.5-2.5 (3) MG/3ML nebulizer solution 3 mL  3 mL Nebulization Q4H PRN Minor, Vilinda Blanks, NP       Oral care mouth rinse  15 mL Mouth Rinse PRN Oretha Milch, MD       phenol (CHLORASEPTIC) mouth spray 1 spray  1 spray Mouth/Throat PRN Oretha Milch, MD   1 spray at 05/12/23 1759   polyethylene glycol (MIRALAX / GLYCOLAX) packet 17 g  17 g Oral Daily PRN Quenton Fetter, RPH       revefenacin (YUPELRI) nebulizer solution 175 mcg  175 mcg Nebulization Daily Oretha Milch, MD   175 mcg at 05/15/23 0726   tamsulosin (FLOMAX) capsule 0.4 mg  0.4 mg Oral QHS Cathleen Corti, MD   0.4 mg at 05/14/23 2056     Discharge Medications: Please see discharge summary for a list of discharge medications.  Relevant Imaging Results:  Relevant Lab Results:   Additional Information SS # [id number]  Ricky Latin, LCSW

## 2023-05-16 ENCOUNTER — Inpatient Hospital Stay (INDEPENDENT_AMBULATORY_CARE_PROVIDER_SITE_OTHER): Payer: No Typology Code available for payment source

## 2023-05-16 ENCOUNTER — Other Ambulatory Visit: Payer: Self-pay | Admitting: Home Health

## 2023-05-16 DIAGNOSIS — R55 Syncope and collapse: Secondary | ICD-10-CM

## 2023-05-16 DIAGNOSIS — J9601 Acute respiratory failure with hypoxia: Secondary | ICD-10-CM | POA: Diagnosis not present

## 2023-05-16 LAB — PROCALCITONIN: Procalcitonin: 0.13 ng/mL

## 2023-05-16 LAB — CBC WITH DIFFERENTIAL/PLATELET
Abs Immature Granulocytes: 0.04 10*3/uL (ref 0.00–0.07)
Basophils Absolute: 0 10*3/uL (ref 0.0–0.1)
Basophils Relative: 1 %
Eosinophils Absolute: 0.2 10*3/uL (ref 0.0–0.5)
Eosinophils Relative: 4 %
HCT: 40 % (ref 39.0–52.0)
Hemoglobin: 12.1 g/dL — ABNORMAL LOW (ref 13.0–17.0)
Immature Granulocytes: 1 %
Lymphocytes Relative: 16 %
Lymphs Abs: 0.7 10*3/uL (ref 0.7–4.0)
MCH: 28.6 pg (ref 26.0–34.0)
MCHC: 30.3 g/dL (ref 30.0–36.0)
MCV: 94.6 fL (ref 80.0–100.0)
Monocytes Absolute: 0.4 10*3/uL (ref 0.1–1.0)
Monocytes Relative: 10 %
Neutro Abs: 2.9 10*3/uL (ref 1.7–7.7)
Neutrophils Relative %: 68 %
Platelets: 127 10*3/uL — ABNORMAL LOW (ref 150–400)
RBC: 4.23 MIL/uL (ref 4.22–5.81)
RDW: 13.4 % (ref 11.5–15.5)
WBC: 4.2 10*3/uL (ref 4.0–10.5)
nRBC: 0 % (ref 0.0–0.2)

## 2023-05-16 LAB — BASIC METABOLIC PANEL
Anion gap: 11 (ref 5–15)
BUN: 35 mg/dL — ABNORMAL HIGH (ref 8–23)
CO2: 29 mmol/L (ref 22–32)
Calcium: 9 mg/dL (ref 8.9–10.3)
Chloride: 99 mmol/L (ref 98–111)
Creatinine, Ser: 1.28 mg/dL — ABNORMAL HIGH (ref 0.61–1.24)
GFR, Estimated: 59 mL/min — ABNORMAL LOW (ref >60)
Glucose, Bld: 196 mg/dL — ABNORMAL HIGH (ref 70–99)
Potassium: 4.5 mmol/L (ref 3.5–5.1)
Sodium: 139 mmol/L (ref 135–145)

## 2023-05-16 LAB — CULTURE, BLOOD (ROUTINE X 2)

## 2023-05-16 LAB — BRAIN NATRIURETIC PEPTIDE: B Natriuretic Peptide: 40.8 pg/mL (ref 0.0–100.0)

## 2023-05-16 LAB — MAGNESIUM: Magnesium: 2 mg/dL (ref 1.7–2.4)

## 2023-05-16 LAB — C-REACTIVE PROTEIN: CRP: 3.3 mg/dL — ABNORMAL HIGH (ref <1.0)

## 2023-05-16 LAB — GLUCOSE, CAPILLARY
Glucose-Capillary: 293 mg/dL — ABNORMAL HIGH (ref 70–99)
Glucose-Capillary: 258 mg/dL — ABNORMAL HIGH (ref 70–99)
Glucose-Capillary: 229 mg/dL — ABNORMAL HIGH (ref 70–99)
Glucose-Capillary: 313 mg/dL — ABNORMAL HIGH (ref 70–99)

## 2023-05-16 MED ORDER — MELATONIN 5 MG PO TABS
5.0000 mg | ORAL_TABLET | Freq: Every evening | ORAL | Status: DC | PRN
  Filled 2023-05-16: qty 1

## 2023-05-16 MED ORDER — INSULIN GLARGINE-YFGN 100 UNIT/ML ~~LOC~~ SOLN
32.0000 [IU] | Freq: Two times a day (BID) | SUBCUTANEOUS | Status: DC
  Administered 2023-05-16 – 2023-05-17 (×4): 32 [IU] via SUBCUTANEOUS
  Filled 2023-05-16 (×5): qty 0.32

## 2023-05-16 NOTE — Progress Notes (Signed)
   05/16/23 2137  BiPAP/CPAP/SIPAP  $ Non-Invasive Home Ventilator  Subsequent  BiPAP/CPAP/SIPAP Pt Type Adult  BiPAP/CPAP/SIPAP Resmed  Mask Type Nasal pillows  Flow Rate 3 lpm  Patient Home Equipment No  Safety Check Completed by RT for Home Unit Yes, no issues noted   Pt self administered and is resting comfortably on his home cpap unit.  Needs no assistance from RT at  this time

## 2023-05-16 NOTE — Progress Notes (Signed)
Follow up arranged with cardiology 06/15/23, see AVS. 2 week zio to be mailed to patient home, Dr Carolan Clines to read.

## 2023-05-16 NOTE — Progress Notes (Unsigned)
2 week Zio monitor ordered for syncope, office staff will mail to the patient home, Dr Carolan Clines to read, appt arranged for follow up 06/15/23 with cardiology

## 2023-05-16 NOTE — Progress Notes (Unsigned)
Enrolled for [employer] to mail a ZIO XT long term holter monitor to the patients address on file.   Dr. Mary Branch to read. 

## 2023-05-16 NOTE — TOC Progression Note (Signed)
Transition of Care (TOC) - Progression Note    Patient Details  Name: KEYDEN WINTERMUTE Sr. MRN: [id number] Date of Birth: 1951-03-25  Transition of Care [facility]) CM/SW Contact  Mearl Latin, LCSW Phone Number: 05/16/2023, 2:14 PM  Clinical Narrative:    Insurance authorization process has been started for [employer].    Expected Discharge Plan: Skilled Nursing Facility Barriers to Discharge: Continued Medical Work up, English as a second language teacher  Expected Discharge Plan and Services In-house Referral: Clinical Social Work   Post Acute Care Choice: Skilled Nursing Facility Living arrangements for the past 2 months: Single Family Home                                       Social Determinants of Health (SDOH) Interventions SDOH Screenings   Food Insecurity: No Food Insecurity (05/06/2023)  Housing: Low Risk  (05/06/2023)  Transportation Needs: No Transportation Needs (05/06/2023)  Utilities: Patient Declined (05/07/2023)  Depression (PHQ2-9): Low Risk  (11/08/2021)  Tobacco Use: Medium Risk (05/06/2023)    Readmission Risk Interventions     No data to display

## 2023-05-16 NOTE — Progress Notes (Signed)
PROGRESS NOTE                                                                                                                                                                                                             Patient Demographics:    Ricky Johnston, is a 72 y.o. male, DOB - [birth year], ZOX:[id number]  Outpatient Primary MD for the patient is [name], MD    LOS - 5  Admit date - 05/10/2023    Chief Complaint  Patient presents with   Respiratory Distress    Patient to ED via EMS from [facility] assisted living with complaint of difficulty breathing. EMS reports patient was 84% on 3 liters. Patient uses 3 liters nasal canula O2 at baseline. EMS gave 125 mg solu medrol and 2 grams magnesium sulfate through and 18 in left bicep. Patient arrives with duo neb treatment being administered.       Brief Narrative (HPI from H&P)    72 year old male with known history of COPD on 3 to 4 L home oxygen at all times, OSA on nocturnal BiPAP, history of bladder cancer, BPH, GERD, CKD 3B with baseline creatinine around 1.5, history of DVT in the remote past, GERD, dyslipidemia, hypertension who was recently admitted to [facility] initially on 05/06/2023 for syncope, he had a thorough workup and was subsequently discharged to a nursing home on 11/08/2023.   Resented a day later again to [facility] from the nursing home with respiratory distress, he was found to be in CHF, he was intubated and transferred to [facility], ICU, he developed some bradycardia in ICU, he was also seen by cardiology.  Overall stabilized and transferred to hospitalist service under my care on 05/15/2023.     Subjective:   Patient in bed, appears comfortable, denies any headache, no fever, no chest pain or pressure, no shortness of breath , no abdominal pain. No new focal weakness.    Assessment  & Plan :    Acute on chronic hypoxic  and hypercapnic respiratory failure thought to be due to combination of acute on chronic diastolic CHF, chronic underlying pulmonary fibrosis, possible mild COPD exacerbation and underlying OSA.  He required endotracheal intubation, admission in ICU, was diuresed, eventually stabilized transition back to 3 to 4 L nasal cannula oxygen in daytime which is  his baseline.  CHF and COPD exacerbation seem to have resolved.  Continue to monitor.  Advance activity.  Obesity.  BMI 40 with OSA.  Nightly CPAP, follow-up with PCP for weight loss.  Syncope last admission, now bradycardia since he was in ICU.  Initially thought to be due to propofol, stable TSH, EKG stable, echo stable, troponin negative, not on any rate controlling agents.  Could be effect of propofol which is longer than expected due to underlying renal insufficiency, will check for chronotropic response, appreciate cardiology input.  Heart rate is improving, continue to monitor.  Acute on chronic diastolic mild RV dysfunction EF.  60%.  Currently see #1 above.  Cardiology following.  History of chronic hypoxic respiratory failure, COPD, pulmonary fibrosis.  OSA.  On 3 to 4 L of nasal cannula oxygen at baseline which will be continued, nighttime CPAP, continue bronchodilators including budesonide, Brovana, Yupelri, close to baseline now.   AKI on CKD 3B  - Cr baseline 1.5, AKI resolved close to baseline now.   History of solitary kidney  History of bladder cancer -  Stable   HLD  -Continue statin   BPH  -Continue tamsulosin   Type 2 diabetes - Semglee and sliding scale along with Premeal NovoLog.  Dose adjusted on 05/16/2023 for better control.  Poor outpatient control due to hyperglycemia as suggested by elevated A1c.  Lab Results  Component Value Date   HGBA1C 8.6 (H) 05/11/2023   CBG (last 3)  Recent Labs    05/15/23 1615 05/15/23 2123 05/16/23 0832  GLUCAP 290* 272* 293*        Condition - Extremely Guarded  Family  Communication  :  None present  Code Status :  Full  Consults  :  PCCM, Cards  PUD Prophylaxis :    Procedures  :     05/11/2023 intubated per [facility] EDP 6/14 extubated    CTA.  No PE.    Head CT.  Nonacute.    TTE -  1. Left ventricular ejection fraction, by estimation, is 55 to 60%. The left ventricle has normal function. The left ventricle has no regional wall motion abnormalities. Left ventricular diastolic parameters are indeterminate.  2. Right ventricular systolic function is mildly reduced. The right ventricular size is normal.  3. The mitral valve is normal in structure. No evidence of mitral valve regurgitation. No evidence of mitral stenosis. Moderate mitral annular calcification.  4. The aortic valve was not well visualized. There is moderate calcification of the aortic valve. Aortic valve regurgitation is not visualized. No aortic stenosis is present.  5. The inferior vena cava is dilated in size with <50% respiratory variability, suggesting right atrial pressure of 15 mmHg.  6. Very limited study due to poor sound wave transmission.      Disposition Plan  :    Status is: Inpatient  DVT Prophylaxis  :    Place TED hose Start: 05/15/23 0844 heparin injection 5,000 Units Start: 05/11/23 1400 SCDs Start: 05/11/23 0803    Lab Results  Component Value Date   PLT 127 (L) 05/16/2023    Diet :  Diet Order             Diet regular Room service appropriate? Yes; Fluid consistency: Thin  Diet effective now                    Inpatient Medications  Scheduled Meds:  arformoterol  15 mcg Nebulization BID   atorvastatin  80 mg  Oral QHS   budesonide (PULMICORT) nebulizer solution  0.5 mg Nebulization BID   Chlorhexidine Gluconate Cloth  6 each Topical Daily   ferrous sulfate  325 mg Oral Daily   gabapentin  300 mg Oral Q12H   heparin  5,000 Units Subcutaneous Q8H   insulin aspart  0-20 Units Subcutaneous TID AC & HS   insulin aspart  5 Units Subcutaneous TID WC    insulin glargine-yfgn  25 Units Subcutaneous BID   revefenacin  175 mcg Nebulization Daily   tamsulosin  0.4 mg Oral QHS   Continuous Infusions:  sodium chloride Stopped (05/12/23 0414)   PRN Meds:.docusate sodium, ipratropium-albuterol, mouth rinse, phenol, polyethylene glycol    Objective:   Vitals:   05/15/23 2033 05/16/23 0000 05/16/23 0400 05/16/23 0833  BP: (!) 115/56 (!) 104/48 (!) 121/50 115/61  Pulse: (!) 57 (!) 57 62 67  Resp: 15 20 17 18   Temp: 98.2 F (36.8 C) 98 F (36.7 C) 98.1 F (36.7 C) 98.7 F (37.1 C)  TempSrc: Oral Oral Oral Oral  SpO2: 97% 92% (!) 89% 93%  Weight:      Height:        Wt Readings from Last 3 Encounters:  05/15/23 121 kg  05/06/23 118.8 kg  11/04/21 125 kg     Intake/Output Summary (Last 24 hours) at 05/16/2023 0852 Last data filed at 05/16/2023 0534 Gross per 24 hour  Intake 240 ml  Output 1000 ml  Net -760 ml     Physical Exam  Awake Alert, No new F.N deficits, Normal affect [place].AT,PERRAL Supple Neck, No JVD,   Symmetrical Chest wall movement, Good air movement bilaterally, CTAB RRR,No Gallops,Rubs or new Murmurs,  +ve B.Sounds, Abd Soft, No tenderness,   No Cyanosis, Clubbing or edema      Data Review:    Recent Labs  Lab 05/11/23 0015 05/11/23 0022 05/11/23 0820 05/12/23 0045 05/13/23 0133 05/15/23 0330 05/16/23 0307  WBC 5.6  --  4.2 8.3 6.5 4.0 4.2  HGB 14.1   < > 13.1 13.2 13.4 12.5* 12.1*  HCT 47.8   < > 44.1 43.4 45.7 40.7 40.0  PLT 143*  --  163 189 164 137* 127*  MCV 100.6*  --  99.5 97.3 98.9 96.7 94.6  MCH 29.7  --  29.6 29.6 29.0 29.7 28.6  MCHC 29.5*  --  29.7* 30.4 29.3* 30.7 30.3  RDW 13.7  --  13.7 13.8 14.1 13.4 13.4  LYMPHSABS 1.2  --   --   --  1.1  --  0.7  MONOABS 0.5  --   --   --  0.6  --  0.4  EOSABS 0.1  --   --   --  0.1  --  0.2  BASOSABS 0.0  --   --   --  0.0  --  0.0   < > = values in this interval not displayed.    Recent Labs  Lab 05/11/23 0015 05/11/23 0022  05/11/23 0120 05/11/23 0322 05/11/23 1610 05/11/23 0745 05/11/23 0820 05/11/23 2124 05/12/23 0045 05/12/23 0301 05/12/23 0550 05/13/23 0133 05/14/23 0130 05/15/23 0330 05/15/23 0629 05/16/23 0307  NA 140   < >  --    < > 138   < >  --  142 141 140  --  137 136 140  --  139  K 4.7   < >  --    < > 5.7*   < >  --  4.6  5.2* 4.7 4.8 3.8 4.2 4.5  --  4.5  CL 99  --   --   --  100  --   --  102 103 102  --  94* 100 99  --  99  CO2 31  --   --   --  25  --   --  30 29 27   --  26 28 29   --  29  ANIONGAP 10  --   --   --  13  --   --  10 9 11   --  17* 8 12  --  11  GLUCOSE 233*  --   --   --  408*  --   --  177* 250* 257*  --  209* 317* 221*  --  196*  BUN 23  --   --   --  32*  --   --  41* 46* 50*  --  60* 49* 41*  --  35*  CREATININE 1.39*  --   --   --  1.45*  --   --  1.79* 1.91* 2.00*  --  1.74* 1.47* 1.57*  --  1.28*  AST 19  --   --   --   --   --   --   --   --   --   --   --   --   --   --   --   ALT 19  --   --   --   --   --   --   --   --   --   --   --   --   --   --   --   ALKPHOS 86  --   --   --   --   --   --   --   --   --   --   --   --   --   --   --   BILITOT 0.4  --   --   --   --   --   --   --   --   --   --   --   --   --   --   --   ALBUMIN 2.9*  --   --   --   --   --   --   --   --   --   --   --   --   --   --   --   CRP  --   --   --   --   --   --   --   --   --   --   --   --   --   --  3.5* 3.3*  PROCALCITON  --   --   --   --   --   --  0.17  --   --   --   --   --   --   --  <0.10 0.13  LATICACIDVEN 0.7  --   --   --  1.8  --   --   --   --   --   --   --   --   --   --   --   INR 1.2  --   --   --   --   --  1.2  --   --   --   --   --   --   --   --   --  TSH  --   --   --   --   --   --   --   --   --   --   --   --  2.197  --   --   --   HGBA1C  --   --   --   --   --   --  8.6*  --   --   --   --   --   --   --   --   --   BNP  --   --  32.9  --   --   --   --   --   --   --   --   --   --   --  72.0 40.8  MG  --   --   --   --   --   --   --  2.6*  2.5*  --   --  2.4  --   --  2.1 2.0  CALCIUM 9.3  --   --   --  8.9  --   --  9.5 9.3 9.1  --  9.2 8.6* 9.0  --  9.0   < > = values in this interval not displayed.      Recent Labs  Lab 05/11/23 0015 05/11/23 0120 05/11/23 1610 05/11/23 0820 05/11/23 2124 05/12/23 0045 05/12/23 0301 05/13/23 0133 05/14/23 0130 05/15/23 0330 05/15/23 0629 05/16/23 0307  CRP  --   --   --   --   --   --   --   --   --   --  3.5* 3.3*  PROCALCITON  --   --   --  0.17  --   --   --   --   --   --  <0.10 0.13  LATICACIDVEN 0.7  --  1.8  --   --   --   --   --   --   --   --   --   INR 1.2  --   --  1.2  --   --   --   --   --   --   --   --   TSH  --   --   --   --   --   --   --   --  2.197  --   --   --   HGBA1C  --   --   --  8.6*  --   --   --   --   --   --   --   --   BNP  --  32.9  --   --   --   --   --   --   --   --  72.0 40.8  MG  --   --   --   --  2.6* 2.5*  --  2.4  --   --  2.1 2.0  CALCIUM 9.3  --  8.9  --  9.5 9.3 9.1 9.2 8.6* 9.0  --  9.0    Recent Labs    05/14/23 0130  TSH 2.197    Radiology Reports DG Chest Port 1 View  Result Date: 05/15/2023 CLINICAL DATA:  Shortness of breath. EXAM: PORTABLE CHEST 1 VIEW COMPARISON:  05/13/2023 FINDINGS: The lungs are clear without focal pneumonia, edema, pneumothorax or pleural effusion. Interstitial markings are diffusely  prominent. The cardio pericardial silhouette is enlarged. No acute bony abnormality. Telemetry leads overlie the chest. IMPRESSION: Diffuse interstitial prominence, likely chronic although component of interstitial edema not excluded. Electronically Signed   By: Kennith Center M.D.   On: 05/15/2023 06:38   DG Chest Port 1 View  Result Date: 05/13/2023 CLINICAL DATA:  72 year old male with history of shortness of breath following extubation. EXAM: PORTABLE CHEST 1 VIEW COMPARISON:  Chest x-ray 05/11/2023. FINDINGS: Patient has been extubated. Previously noted nasogastric tube has also been removed. Lung volumes are  normal. Patchy areas of interstitial prominence, widespread peribronchial cuffing and patchy ill-defined opacities are again noted in the lungs, most evident in the periphery of the left lung base at the costophrenic sulcus. Overall, aeration appears slightly improved. No definite pleural effusions. Mild cephalization of the pulmonary vasculature. Mild cardiomegaly. The patient is rotated to the right on today's exam, resulting in distortion of the mediastinal contours and reduced diagnostic sensitivity and specificity for mediastinal pathology. IMPRESSION: 1. Improving aeration in the lungs likely reflecting resolving congestive heart failure. Electronically Signed   By: Trudie Reed M.D.   On: 05/13/2023 07:35      Signature  -   Susa Raring M.D on 05/16/2023 at 8:52 AM   -  To page go to [email or web address]

## 2023-05-17 DIAGNOSIS — J9601 Acute respiratory failure with hypoxia: Secondary | ICD-10-CM | POA: Diagnosis not present

## 2023-05-17 LAB — CBC WITH DIFFERENTIAL/PLATELET
Abs Immature Granulocytes: 0.04 10*3/uL (ref 0.00–0.07)
Basophils Absolute: 0 10*3/uL (ref 0.0–0.1)
Basophils Relative: 1 %
Eosinophils Absolute: 0.2 10*3/uL (ref 0.0–0.5)
Eosinophils Relative: 4 %
HCT: 42 % (ref 39.0–52.0)
Hemoglobin: 12.8 g/dL — ABNORMAL LOW (ref 13.0–17.0)
Immature Granulocytes: 1 %
Lymphocytes Relative: 19 %
Lymphs Abs: 0.9 10*3/uL (ref 0.7–4.0)
MCH: 28.6 pg (ref 26.0–34.0)
MCHC: 30.5 g/dL (ref 30.0–36.0)
MCV: 94 fL (ref 80.0–100.0)
Monocytes Absolute: 0.4 10*3/uL (ref 0.1–1.0)
Monocytes Relative: 10 %
Neutro Abs: 2.9 10*3/uL (ref 1.7–7.7)
Neutrophils Relative %: 65 %
Platelets: 143 10*3/uL — ABNORMAL LOW (ref 150–400)
RBC: 4.47 MIL/uL (ref 4.22–5.81)
RDW: 13.4 % (ref 11.5–15.5)
WBC: 4.4 10*3/uL (ref 4.0–10.5)
nRBC: 0 % (ref 0.0–0.2)

## 2023-05-17 LAB — BASIC METABOLIC PANEL
Anion gap: 8 (ref 5–15)
BUN: 31 mg/dL — ABNORMAL HIGH (ref 8–23)
CO2: 31 mmol/L (ref 22–32)
Calcium: 9 mg/dL (ref 8.9–10.3)
Chloride: 98 mmol/L (ref 98–111)
Creatinine, Ser: 1.37 mg/dL — ABNORMAL HIGH (ref 0.61–1.24)
GFR, Estimated: 55 mL/min — ABNORMAL LOW (ref >60)
Glucose, Bld: 186 mg/dL — ABNORMAL HIGH (ref 70–99)
Potassium: 4.2 mmol/L (ref 3.5–5.1)
Sodium: 137 mmol/L (ref 135–145)

## 2023-05-17 LAB — C-REACTIVE PROTEIN: CRP: 3.9 mg/dL — ABNORMAL HIGH (ref <1.0)

## 2023-05-17 LAB — GLUCOSE, CAPILLARY
Glucose-Capillary: 322 mg/dL — ABNORMAL HIGH (ref 70–99)
Glucose-Capillary: 240 mg/dL — ABNORMAL HIGH (ref 70–99)
Glucose-Capillary: 347 mg/dL — ABNORMAL HIGH (ref 70–99)
Glucose-Capillary: 408 mg/dL — ABNORMAL HIGH (ref 70–99)

## 2023-05-17 LAB — MAGNESIUM: Magnesium: 2 mg/dL (ref 1.7–2.4)

## 2023-05-17 LAB — PROCALCITONIN: Procalcitonin: 0.13 ng/mL

## 2023-05-17 LAB — BRAIN NATRIURETIC PEPTIDE: B Natriuretic Peptide: 35.1 pg/mL (ref 0.0–100.0)

## 2023-05-17 MED ORDER — GERHARDT'S BUTT CREAM
TOPICAL_CREAM | Freq: Every day | CUTANEOUS | Status: DC
  Filled 2023-05-17: qty 1

## 2023-05-17 MED ORDER — NYSTATIN 100000 UNIT/GM EX POWD
Freq: Two times a day (BID) | CUTANEOUS | Status: DC
  Filled 2023-05-17: qty 15

## 2023-05-17 MED ORDER — INSULIN ASPART 100 UNIT/ML IJ SOLN
10.0000 [IU] | Freq: Once | INTRAMUSCULAR | Status: AC
  Administered 2023-05-17: 10 [IU] via INTRAVENOUS

## 2023-05-17 NOTE — Inpatient Diabetes Management (Addendum)
Inpatient Diabetes Program Recommendations  AACE/ADA: New Consensus Statement on Inpatient Glycemic Control (2015)  Target Ranges:  Prepandial:   less than 140 mg/dL      Peak postprandial:   less than 180 mg/dL (1-2 hours)      Critically ill patients:  140 - 180 mg/dL   Lab Results  Component Value Date   GLUCAP 322 (H) 05/17/2023   HGBA1C 8.6 (H) 05/11/2023    Review of Glycemic Control  Latest Reference Range & Units 05/16/23 08:32 05/16/23 11:09 05/16/23 15:59 05/16/23 21:14 05/17/23 09:19  Glucose-Capillary 70 - 99 mg/dL 161 (H) 096 (H) 045 (H) 313 (H) 322 (H)    Diabetes history: DM 2 Outpatient Diabetes medications: Concentrated Humulin R U-500 30-60-60, Jardiance 25 mg Daily (ordered has not started yet) Current orders for Inpatient glycemic control:  Semglee 23 units qhs Novolog 0-20 units tid and hs Novolog 5 units tid meal coverage  A1c 8.6% on 6/13  Inpatient Diabetes Program Recommendations:    Note: pt on concentrated insulin at home. From SNF.  -  Consider increasing basal insulin and Novolog meal coverage  Another option is to restart a portion of pt home concentrated Humulin R U-500 insulin.   MD to assess during rounds.  Thanks,  Christena Deem RN, MSN, BC-ADM Inpatient Diabetes Coordinator Team Pager [phone] (8a-5p)

## 2023-05-17 NOTE — Progress Notes (Signed)
PROGRESS NOTE                                                                                                                                                                                                             Patient Demographics:    Ricky Johnston, is a 72 y.o. male, DOB - 1951-01-15, ZOX:[id number]  Outpatient Primary MD for the patient is [name], MD    LOS - 6  Admit date - 05/10/2023    Chief Complaint  Patient presents with   Respiratory Distress    Patient to ED via EMS from [facility] assisted living with complaint of difficulty breathing. EMS reports patient was 84% on 3 liters. Patient uses 3 liters nasal canula O2 at baseline. EMS gave 125 mg solu medrol and 2 grams magnesium sulfate through and 18 in left bicep. Patient arrives with duo neb treatment being administered.       Brief Narrative (HPI from H&P)    72 year old male with known history of COPD on 3 to 4 L home oxygen at all times, OSA on nocturnal BiPAP, history of bladder cancer, BPH, GERD, CKD 3B with baseline creatinine around 1.5, history of DVT in the remote past, GERD, dyslipidemia, hypertension who was recently admitted to [facility] initially on 05/06/2023 for syncope, he had a thorough workup and was subsequently discharged to a nursing home on 11/08/2023.   Resented a day later again to [facility] from the nursing home with respiratory distress, he was found to be in CHF, he was intubated and transferred to [facility], ICU, he developed some bradycardia in ICU, he was also seen by cardiology.  Overall stabilized and transferred to hospitalist service under my care on 05/15/2023.     Subjective:   Patient in bed, appears comfortable, denies any headache, no fever, no chest pain or pressure, no shortness of breath , no abdominal pain. No new focal weakness.   Assessment  & Plan :    Acute on chronic hypoxic and  hypercapnic respiratory failure thought to be due to combination of acute on chronic diastolic CHF, chronic underlying pulmonary fibrosis, possible mild COPD exacerbation and underlying OSA.  He required endotracheal intubation, admission in ICU, was diuresed, eventually stabilized transition back to 3 to 4 L nasal cannula oxygen in daytime which is his  baseline.  CHF and COPD exacerbation seem to have resolved.  Continue to monitor.  Advance activity.  Obesity.  BMI 40 with OSA.  Nightly CPAP, follow-up with PCP for weight loss.  Syncope last admission, now bradycardia since he was in ICU.  Initially thought to be due to propofol, stable TSH, EKG stable, echo stable, troponin negative, not on any rate controlling agents.  Could be effect of propofol which is longer than expected due to underlying renal insufficiency, will check for chronotropic response, appreciate cardiology input. Heart rate is improving, continue to monitor.  Acute on chronic diastolic mild RV dysfunction EF.  60%.  Currently see #1 above.  Cardiology following.  History of chronic hypoxic respiratory failure, COPD, pulmonary fibrosis.  OSA.  On 3 to 4 L of nasal cannula oxygen at baseline which will be continued, nighttime CPAP, continue bronchodilators including budesonide, Brovana, Yupelri, close to baseline now.   AKI on CKD 3B  - Cr baseline 1.5, AKI resolved close to baseline now.   History of solitary kidney  History of bladder cancer -  Stable   HLD  -Continue statin   BPH  -Continue tamsulosin   Type 2 diabetes - Semglee and sliding scale along with Premeal NovoLog.  Dose adjusted on 05/16/2023 for better control.  Poor outpatient control due to hyperglycemia as suggested by elevated A1c.  Lab Results  Component Value Date   HGBA1C 8.6 (H) 05/11/2023   CBG (last 3)  Recent Labs    05/16/23 1109 05/16/23 1559 05/16/23 2114  GLUCAP 258* 229* 313*        Condition - Extremely Guarded  Family  Communication  :  None present  Code Status :  Full  Consults  :  PCCM, Cards  PUD Prophylaxis :    Procedures  :     05/11/2023 intubated per [facility] EDP 6/14 extubated    CTA.  No PE.    Head CT.  Nonacute.    TTE -  1. Left ventricular ejection fraction, by estimation, is 55 to 60%. The left ventricle has normal function. The left ventricle has no regional wall motion abnormalities. Left ventricular diastolic parameters are indeterminate.  2. Right ventricular systolic function is mildly reduced. The right ventricular size is normal.  3. The mitral valve is normal in structure. No evidence of mitral valve regurgitation. No evidence of mitral stenosis. Moderate mitral annular calcification.  4. The aortic valve was not well visualized. There is moderate calcification of the aortic valve. Aortic valve regurgitation is not visualized. No aortic stenosis is present.  5. The inferior vena cava is dilated in size with <50% respiratory variability, suggesting right atrial pressure of 15 mmHg.  6. Very limited study due to poor sound wave transmission.      Disposition Plan  :    Status is: Inpatient  DVT Prophylaxis  :    Place TED hose Start: 05/15/23 0844 heparin injection 5,000 Units Start: 05/11/23 1400 SCDs Start: 05/11/23 0803    Lab Results  Component Value Date   PLT 143 (L) 05/17/2023    Diet :  Diet Order             Diet regular Room service appropriate? Yes; Fluid consistency: Thin  Diet effective now                    Inpatient Medications  Scheduled Meds:  arformoterol  15 mcg Nebulization BID   atorvastatin  80 mg Oral QHS  budesonide (PULMICORT) nebulizer solution  0.5 mg Nebulization BID   Chlorhexidine Gluconate Cloth  6 each Topical Daily   ferrous sulfate  325 mg Oral Daily   gabapentin  300 mg Oral Q12H   heparin  5,000 Units Subcutaneous Q8H   insulin aspart  0-20 Units Subcutaneous TID AC & HS   insulin aspart  5 Units Subcutaneous TID WC    insulin glargine-yfgn  32 Units Subcutaneous BID   revefenacin  175 mcg Nebulization Daily   tamsulosin  0.4 mg Oral QHS   Continuous Infusions:  sodium chloride Stopped (05/12/23 0414)   PRN Meds:.docusate sodium, ipratropium-albuterol, melatonin, mouth rinse, phenol, polyethylene glycol    Objective:   Vitals:   05/17/23 0000 05/17/23 0400 05/17/23 0707 05/17/23 0711  BP: (!) 117/54 (!) 124/49    Pulse: 60 60    Resp: 18 20    Temp: 98 F (36.7 C) 98.1 F (36.7 C)    TempSrc: Oral Oral    SpO2: (!) 89% (!) 86% 96% 96%  Weight:      Height:        Wt Readings from Last 3 Encounters:  05/15/23 121 kg  05/06/23 118.8 kg  11/04/21 125 kg     Intake/Output Summary (Last 24 hours) at 05/17/2023 0846 Last data filed at 05/17/2023 0600 Gross per 24 hour  Intake 240 ml  Output 1850 ml  Net -1610 ml     Physical Exam  Awake Alert, No new F.N deficits, Normal affect Acme.AT,PERRAL Supple Neck, No JVD,   Symmetrical Chest wall movement, Good air movement bilaterally, CTAB RRR,No Gallops,Rubs or new Murmurs,  +ve B.Sounds, Abd Soft, No tenderness,   No Cyanosis, Clubbing or edema      Data Review:    Recent Labs  Lab 05/11/23 0015 05/11/23 0022 05/12/23 0045 05/13/23 0133 05/15/23 0330 05/16/23 0307 05/17/23 0246  WBC 5.6   < > 8.3 6.5 4.0 4.2 4.4  HGB 14.1   < > 13.2 13.4 12.5* 12.1* 12.8*  HCT 47.8   < > 43.4 45.7 40.7 40.0 42.0  PLT 143*   < > 189 164 137* 127* 143*  MCV 100.6*   < > 97.3 98.9 96.7 94.6 94.0  MCH 29.7   < > 29.6 29.0 29.7 28.6 28.6  MCHC 29.5*   < > 30.4 29.3* 30.7 30.3 30.5  RDW 13.7   < > 13.8 14.1 13.4 13.4 13.4  LYMPHSABS 1.2  --   --  1.1  --  0.7 0.9  MONOABS 0.5  --   --  0.6  --  0.4 0.4  EOSABS 0.1  --   --  0.1  --  0.2 0.2  BASOSABS 0.0  --   --  0.0  --  0.0 0.0   < > = values in this interval not displayed.    Recent Labs  Lab 05/11/23 0015 05/11/23 0022 05/11/23 0120 05/11/23 0322 05/11/23 4540 05/11/23 0745  05/11/23 0820 05/11/23 2124 05/12/23 0045 05/12/23 0301 05/13/23 0133 05/14/23 0130 05/15/23 0330 05/15/23 0629 05/16/23 0307 05/17/23 0246  NA 140   < >  --    < > 138   < >  --    < > 141   < > 137 136 140  --  139 137  K 4.7   < >  --    < > 5.7*   < >  --    < > 5.2*   < > 3.8  4.2 4.5  --  4.5 4.2  CL 99  --   --   --  100  --   --    < > 103   < > 94* 100 99  --  99 98  CO2 31  --   --   --  25  --   --    < > 29   < > 26 28 29   --  29 31  ANIONGAP 10  --   --   --  13  --   --    < > 9   < > 17* 8 12  --  11 8  GLUCOSE 233*  --   --   --  408*  --   --    < > 250*   < > 209* 317* 221*  --  196* 186*  BUN 23  --   --   --  32*  --   --    < > 46*   < > 60* 49* 41*  --  35* 31*  CREATININE 1.39*  --   --   --  1.45*  --   --    < > 1.91*   < > 1.74* 1.47* 1.57*  --  1.28* 1.37*  AST 19  --   --   --   --   --   --   --   --   --   --   --   --   --   --   --   ALT 19  --   --   --   --   --   --   --   --   --   --   --   --   --   --   --   ALKPHOS 86  --   --   --   --   --   --   --   --   --   --   --   --   --   --   --   BILITOT 0.4  --   --   --   --   --   --   --   --   --   --   --   --   --   --   --   ALBUMIN 2.9*  --   --   --   --   --   --   --   --   --   --   --   --   --   --   --   CRP  --   --   --   --   --   --   --   --   --   --   --   --   --  3.5* 3.3* 3.9*  PROCALCITON  --   --   --   --   --   --  0.17  --   --   --   --   --   --  <0.10 0.13 0.13  LATICACIDVEN 0.7  --   --   --  1.8  --   --   --   --   --   --   --   --   --   --   --   INR 1.2  --   --   --   --   --  1.2  --   --   --   --   --   --   --   --   --   TSH  --   --   --   --   --   --   --   --   --   --   --  2.197  --   --   --   --   HGBA1C  --   --   --   --   --   --  8.6*  --   --   --   --   --   --   --   --   --   BNP  --   --  32.9  --   --   --   --   --   --   --   --   --   --  72.0 40.8 35.1  MG  --   --   --   --   --   --   --    < > 2.5*  --  2.4  --   --  2.1 2.0 2.0   CALCIUM 9.3  --   --   --  8.9  --   --    < > 9.3   < > 9.2 8.6* 9.0  --  9.0 9.0   < > = values in this interval not displayed.      Recent Labs  Lab 05/11/23 0015 05/11/23 0120 05/11/23 0653 05/11/23 0820 05/11/23 2124 05/12/23 0045 05/12/23 0301 05/13/23 0133 05/14/23 0130 05/15/23 0330 05/15/23 0629 05/16/23 0307 05/17/23 0246  CRP  --   --   --   --   --   --   --   --   --   --  3.5* 3.3* 3.9*  PROCALCITON  --   --   --  0.17  --   --   --   --   --   --  <0.10 0.13 0.13  LATICACIDVEN 0.7  --  1.8  --   --   --   --   --   --   --   --   --   --   INR 1.2  --   --  1.2  --   --   --   --   --   --   --   --   --   TSH  --   --   --   --   --   --   --   --  2.197  --   --   --   --   HGBA1C  --   --   --  8.6*  --   --   --   --   --   --   --   --   --   BNP  --  32.9  --   --   --   --   --   --   --   --  72.0 40.8 35.1  MG  --   --   --   --    < > 2.5*  --  2.4  --   --  2.1 2.0 2.0  CALCIUM 9.3  --  8.9  --    < > 9.3   < > 9.2 8.6* 9.0  --  9.0 9.0   < > = values in this interval not displayed.    No results for input(s): "TSH", "T4TOTAL", "T3FREE", "THYROIDAB" in the last 72 hours.  Invalid input(s): "FREET3"   Radiology Reports DG Chest Port 1 View  Result Date: 05/15/2023 CLINICAL DATA:  Shortness of breath. EXAM: PORTABLE CHEST 1 VIEW COMPARISON:  05/13/2023 FINDINGS: The lungs are clear without focal pneumonia, edema, pneumothorax or pleural effusion. Interstitial markings are diffusely prominent. The cardio pericardial silhouette is enlarged. No acute bony abnormality. Telemetry leads overlie the chest. IMPRESSION: Diffuse interstitial prominence, likely chronic although component of interstitial edema not excluded. Electronically Signed   By: Kennith Center M.D.   On: 05/15/2023 06:38      Signature  -   Susa Raring M.D on 05/17/2023 at 8:46 AM   -  To page go to [email or web address]

## 2023-05-17 NOTE — Progress Notes (Signed)
Patient required 4L through cpap overnight.  Sats remained 88-92%

## 2023-05-17 NOTE — Progress Notes (Signed)
Notified provider of CBG 408, orders received.

## 2023-05-17 NOTE — Progress Notes (Signed)
Physical Therapy Treatment Patient Details Name: Ricky BERCHTOLD Sr. MRN: [id number] DOB: 27-Jun-1951 Today's Date: 05/17/2023   History of Present Illness Pt is 72 year old presented to [facility] on  05/11/23 for acute on chronic respiratory failure due to copd exacerbation and acute CHF. Pt intubated. Extubated 6/14. PMH - COPD, HTN, DM, bladder CA, UE DVT, CHF, obesity, THR    PT Comments    Pt was seen for mobility and declined based on foot pain, but discussed his PF posture of feet and restrictions of ROM from both calves and ant compartment B.  LLE is obviously less muscular, but B foot pain is his limitation.  Follow along with him to encourage more self ROM and time OOB in chair to increase endurance and core strength toward greater independence.  Follow acutely for goals of PT.  Contacted MD to talk with pt about his questions about his heart.   Recommendations for follow up therapy are one component of a multi-disciplinary discharge planning process, led by the attending physician.  Recommendations may be updated based on patient status, additional functional criteria and insurance authorization.  Follow Up Recommendations  Can patient physically be transported by private vehicle: No    Assistance Recommended at Discharge Frequent or constant Supervision/Assistance  Patient can return home with the following A lot of help with walking and/or transfers;A lot of help with bathing/dressing/bathroom;Assistance with cooking/housework;Assist for transportation;Help with stairs or ramp for entrance   Equipment Recommendations  Other (comment) (TBD)    Recommendations for Other Services       Precautions / Restrictions Precautions Precautions: Fall Restrictions Weight Bearing Restrictions: No     Mobility  Bed Mobility Overal bed mobility: Needs Assistance             General bed mobility comments: declined to move due to foot pain    Transfers                    General transfer comment: declined    Ambulation/Gait                   Stairs             Wheelchair Mobility    Modified Rankin (Stroke Patients Only)       Balance                                            Cognition Arousal/Alertness: Awake/alert Behavior During Therapy: WFL for tasks assessed/performed Overall Cognitive Status: Within Functional Limits for tasks assessed                                          Exercises General Exercises - Lower Extremity Ankle Circles/Pumps: AROM, 10 reps Quad Sets: AROM, 20 reps Gluteal Sets: AROM, 20 reps Heel Slides: AROM, 20 reps Hip ABduction/ADduction: AROM, AAROM, 20 reps    General Comments General comments (skin integrity, edema, etc.): Pt is awake but declining to get OOB due to foot pain, but did talk with hima bout the deficits that make him struggle to stand, DF limitations, strength in legs      Pertinent Vitals/Pain Pain Assessment Pain Assessment: Faces Faces Pain Scale: Hurts even more Pain Location: Bilateral feet Pain Descriptors / Indicators: Guarding,  Grimacing, Aching Pain Intervention(s): Monitored during session, Premedicated before session, Repositioned, Limited activity within patient's tolerance    Home Living                          Prior Function            PT Goals (current goals can now be found in the care plan section) Progress towards PT goals: Not progressing toward goals - comment    Frequency    Min 3X/week      PT Plan Current plan remains appropriate    Co-evaluation              AM-PAC PT "6 Clicks" Mobility   Outcome Measure  Help needed turning from your back to your side while in a flat bed without using bedrails?: A Lot Help needed moving from lying on your back to sitting on the side of a flat bed without using bedrails?: A Lot Help needed moving to and from a bed to a chair (including a  wheelchair)?: Total Help needed standing up from a chair using your arms (e.g., wheelchair or bedside chair)?: Total Help needed to walk in hospital room?: Total Help needed climbing 3-5 steps with a railing? : Total 6 Click Score: 8    End of Session Equipment Utilized During Treatment: Oxygen Activity Tolerance: Patient limited by fatigue;No increased pain;Patient limited by pain Patient left: in bed;with call bell/phone within reach;with bed alarm set Nurse Communication: Mobility status PT Visit Diagnosis: Unsteadiness on feet (R26.81);Other abnormalities of gait and mobility (R26.89);Muscle weakness (generalized) (M62.81)     Time: 9147-8295 PT Time Calculation (min) (ACUTE ONLY): 24 min  Charges:  $Therapeutic Exercise: 23-37 mins    Ivar Drape 05/17/2023, 4:20 PM  Samul Dada, PT PhD Acute Rehab Dept. Number: [facility] [id number] and [facility] [phone]

## 2023-05-17 NOTE — Progress Notes (Signed)
   05/17/23 2135  BiPAP/CPAP/SIPAP  $ Non-Invasive Home Ventilator  Subsequent  BiPAP/CPAP/SIPAP Pt Type Adult  BiPAP/CPAP/SIPAP Resmed  Mask Type Nasal pillows  Patient Home Equipment Yes  Safety Check Completed by RT for Home Unit Yes, no issues noted   Pt self administered and is resting comfortably on his home unit.  Pt needs no assistance from RT at this time

## 2023-05-17 NOTE — TOC Progression Note (Signed)
Transition of Care (TOC) - Progression Note    Patient Details  Name: Ricky SEBASTIANI Sr. MRN: [id number] Date of Birth: 1951-10-29  Transition of Care [facility]) CM/SW Contact  Mearl Latin, LCSW Phone Number: 05/17/2023, 9:28 AM  Clinical Narrative:    Insurance approval received for [place], Ref# [id number], Auth ID# [id number], effective 05/17/2023-05/19/2023.   Expected Discharge Plan: Skilled Nursing Facility Barriers to Discharge: Continued Medical Work up, English as a second language teacher  Expected Discharge Plan and Services In-house Referral: Clinical Social Work   Post Acute Care Choice: Skilled Nursing Facility Living arrangements for the past 2 months: Single Family Home                                       Social Determinants of Health (SDOH) Interventions SDOH Screenings   Food Insecurity: No Food Insecurity (05/06/2023)  Housing: Low Risk  (05/06/2023)  Transportation Needs: No Transportation Needs (05/06/2023)  Utilities: Patient Declined (05/07/2023)  Depression (PHQ2-9): Low Risk  (11/08/2021)  Tobacco Use: Medium Risk (05/06/2023)    Readmission Risk Interventions     No data to display

## 2023-05-18 DIAGNOSIS — J9601 Acute respiratory failure with hypoxia: Secondary | ICD-10-CM | POA: Diagnosis not present

## 2023-05-18 LAB — CBC WITH DIFFERENTIAL/PLATELET
Abs Immature Granulocytes: 0.05 10*3/uL (ref 0.00–0.07)
Basophils Absolute: 0 10*3/uL (ref 0.0–0.1)
Basophils Relative: 1 %
Eosinophils Absolute: 0.2 10*3/uL (ref 0.0–0.5)
Eosinophils Relative: 4 %
HCT: 40.1 % (ref 39.0–52.0)
Hemoglobin: 12.2 g/dL — ABNORMAL LOW (ref 13.0–17.0)
Immature Granulocytes: 1 %
Lymphocytes Relative: 17 %
Lymphs Abs: 0.7 10*3/uL (ref 0.7–4.0)
MCH: 28.4 pg (ref 26.0–34.0)
MCHC: 30.4 g/dL (ref 30.0–36.0)
MCV: 93.5 fL (ref 80.0–100.0)
Monocytes Absolute: 0.4 10*3/uL (ref 0.1–1.0)
Monocytes Relative: 10 %
Neutro Abs: 2.8 10*3/uL (ref 1.7–7.7)
Neutrophils Relative %: 67 %
Platelets: 142 10*3/uL — ABNORMAL LOW (ref 150–400)
RBC: 4.29 MIL/uL (ref 4.22–5.81)
RDW: 13.7 % (ref 11.5–15.5)
WBC: 4.2 10*3/uL (ref 4.0–10.5)
nRBC: 0 % (ref 0.0–0.2)

## 2023-05-18 LAB — BASIC METABOLIC PANEL
Anion gap: 8 (ref 5–15)
BUN: 30 mg/dL — ABNORMAL HIGH (ref 8–23)
CO2: 29 mmol/L (ref 22–32)
Calcium: 8.9 mg/dL (ref 8.9–10.3)
Chloride: 100 mmol/L (ref 98–111)
Creatinine, Ser: 1.21 mg/dL (ref 0.61–1.24)
GFR, Estimated: >60 mL/min (ref >60)
Glucose, Bld: 334 mg/dL — ABNORMAL HIGH (ref 70–99)
Potassium: 4.6 mmol/L (ref 3.5–5.1)
Sodium: 137 mmol/L (ref 135–145)

## 2023-05-18 LAB — PROCALCITONIN: Procalcitonin: 0.13 ng/mL

## 2023-05-18 LAB — C-REACTIVE PROTEIN: CRP: 3.6 mg/dL — ABNORMAL HIGH (ref <1.0)

## 2023-05-18 LAB — BRAIN NATRIURETIC PEPTIDE: B Natriuretic Peptide: 34 pg/mL (ref 0.0–100.0)

## 2023-05-18 LAB — MAGNESIUM: Magnesium: 1.9 mg/dL (ref 1.7–2.4)

## 2023-05-18 LAB — GLUCOSE, CAPILLARY: Glucose-Capillary: 377 mg/dL — ABNORMAL HIGH (ref 70–99)

## 2023-05-18 MED ORDER — INSULIN GLARGINE 100 UNIT/ML ~~LOC~~ SOLN: 40.0000 [IU] | Freq: Two times a day (BID) | SUBCUTANEOUS | 0 refills | Status: DC

## 2023-05-18 MED ORDER — INSULIN GLARGINE-YFGN 100 UNIT/ML ~~LOC~~ SOLN
35.0000 [IU] | Freq: Two times a day (BID) | SUBCUTANEOUS | Status: DC
  Administered 2023-05-18: 35 [IU] via SUBCUTANEOUS
  Filled 2023-05-18 (×2): qty 0.35

## 2023-05-18 MED ORDER — INSULIN ASPART 100 UNIT/ML FLEXPEN: PEN_INJECTOR | SUBCUTANEOUS | 0 refills | Status: DC

## 2023-05-18 MED ORDER — FLUTICASONE-SALMETEROL 250-50 MCG/DOSE IN AEPB: 1.0000 | INHALATION_SPRAY | Freq: Every day | RESPIRATORY_TRACT | Status: DC

## 2023-05-18 NOTE — Discharge Instructions (Signed)
Follow with Primary MD [name], MD in 7 days   Get CBC, CMP, 2 view Chest X ray -  checked next visit with your primary MD or SNF MD   Activity: As tolerated with Full fall precautions use walker/cane & assistance as needed  Disposition SNF  Diet: Heart Healthy Low Carb, check CBGs q. [facility]  Special Instructions: If you have smoked or chewed Tobacco  in the last 2 yrs please stop smoking, stop any regular Alcohol  and or any Recreational drug use.  On your next visit with your primary care physician please Get Medicines reviewed and adjusted.  Please request your Prim.MD to go over all Hospital Tests and Procedure/Radiological results at the follow up, please get all Hospital records sent to your Prim MD by signing hospital release before you go home.  If you experience worsening of your admission symptoms, develop shortness of breath, life threatening emergency, suicidal or homicidal thoughts you must seek medical attention immediately by calling 911 or calling your MD immediately  if symptoms less severe.  You Must read complete instructions/literature along with all the possible adverse reactions/side effects for all the Medicines you take and that have been prescribed to you. Take any new Medicines after you have completely understood and accpet all the possible adverse reactions/side effects.

## 2023-05-18 NOTE — TOC Transition Note (Signed)
Transition of Care [facility]) - CM/SW Discharge Note   Patient Details  Name: Ricky SANGSTER Sr. MRN: [id number] Date of Birth: 12/10/1950  Transition of Care [facility]) CM/SW Contact:  Mearl Latin, LCSW Phone Number: 05/18/2023, 9:36 AM   Clinical Narrative:    Patient will DC to: [name] Anticipated DC date: 05/18/23 Family notified: Pt notified spouse Transport by: Sharin Mons   Per MD patient ready for DC [facility] RN to call report prior to discharge [phone] room 504B). RN, patient, patient's family, and facility notified of DC. Discharge Summary and FL2 sent to facility. DC packet on chart. Ambulance transport requested for patient.   CSW will sign off for now as social work intervention is no longer needed. Please consult [place] again if new needs arise.     Final next level of care: Skilled Nursing Facility Barriers to Discharge: Barriers Resolved   Patient Goals and CMS Choice      Discharge Placement     Existing PASRR number confirmed : 05/18/23          Patient chooses bed at: [name] Patient to be transferred to facility by: [facility] Name of family member notified: Spouse Patient and family notified of of transfer: 05/18/23  Discharge Plan and Services Additional resources added to the After Visit Summary for   In-house Referral: Clinical Social Work   Post Acute Care Choice: Skilled Nursing Facility                               Social Determinants of Health (SDOH) Interventions SDOH Screenings   Food Insecurity: No Food Insecurity (05/06/2023)  Housing: Low Risk  (05/06/2023)  Transportation Needs: No Transportation Needs (05/06/2023)  Utilities: Patient Declined (05/07/2023)  Depression (PHQ2-9): Low Risk  (11/08/2021)  Tobacco Use: Medium Risk (05/06/2023)     Readmission Risk Interventions     No data to display

## 2023-05-18 NOTE — Plan of Care (Signed)
  Problem: Education: Goal: Ability to describe self-care measures that may prevent or decrease complications (Diabetes Survival Skills Education) will improve Outcome: Adequate for Discharge Goal: Individualized Educational Video(s) Outcome: Adequate for Discharge   Problem: Coping: Goal: Ability to adjust to condition or change in health will improve Outcome: Adequate for Discharge   Problem: Fluid Volume: Goal: Ability to maintain a balanced intake and output will improve Outcome: Adequate for Discharge   Problem: Health Behavior/Discharge Planning: Goal: Ability to identify and utilize available resources and services will improve Outcome: Adequate for Discharge Goal: Ability to manage health-related needs will improve Outcome: Adequate for Discharge   Problem: Metabolic: Goal: Ability to maintain appropriate glucose levels will improve Outcome: Adequate for Discharge   Problem: Nutritional: Goal: Maintenance of adequate nutrition will improve Outcome: Adequate for Discharge Goal: Progress toward achieving an optimal weight will improve Outcome: Adequate for Discharge   Problem: Skin Integrity: Goal: Risk for impaired skin integrity will decrease Outcome: Adequate for Discharge   Problem: Tissue Perfusion: Goal: Adequacy of tissue perfusion will improve Outcome: Adequate for Discharge   Problem: Education: Goal: Knowledge of General Education information will improve Description: Including pain rating scale, medication(s)/side effects and non-pharmacologic comfort measures Outcome: Adequate for Discharge   Problem: Safety: Goal: Non-violent Restraint(s) Outcome: Adequate for Discharge   Problem: Clinical Measurements: Goal: Ability to maintain clinical measurements within normal limits will improve Outcome: Adequate for Discharge Goal: Will remain free from infection Outcome: Adequate for Discharge Goal: Diagnostic test results will improve Outcome: Adequate  for Discharge Goal: Respiratory complications will improve Outcome: Adequate for Discharge Goal: Cardiovascular complication will be avoided Outcome: Adequate for Discharge   Problem: Health Behavior/Discharge Planning: Goal: Ability to manage health-related needs will improve Outcome: Adequate for Discharge   Problem: Nutrition: Goal: Adequate nutrition will be maintained Outcome: Adequate for Discharge   Problem: Activity: Goal: Risk for activity intolerance will decrease Outcome: Adequate for Discharge   Problem: Coping: Goal: Level of anxiety will decrease Outcome: Adequate for Discharge   Problem: Elimination: Goal: Will not experience complications related to bowel motility Outcome: Adequate for Discharge Goal: Will not experience complications related to urinary retention Outcome: Adequate for Discharge   Problem: Pain Managment: Goal: General experience of comfort will improve Outcome: Adequate for Discharge   Problem: Safety: Goal: Ability to remain free from injury will improve Outcome: Adequate for Discharge   Problem: Skin Integrity: Goal: Risk for impaired skin integrity will decrease Outcome: Adequate for Discharge

## 2023-05-18 NOTE — Progress Notes (Addendum)
Notified provider of bradycardia and increased oxygen to cpap at 6L

## 2023-05-18 NOTE — Progress Notes (Signed)
Report called in to [facility] LPN.

## 2023-05-18 NOTE — Discharge Summary (Signed)
Ricky PRIDE Sr. MVH:[id number] DOB: 1951-06-11 DOA: 05/10/2023  PCP: [name], MD  Admit date: 05/10/2023  Discharge date: 05/18/2023  Admitted From: Home   Disposition:  SNF   Recommendations for Outpatient Follow-up:   Follow up with PCP in 1-2 weeks  PCP Please obtain BMP/CBC, 2 view CXR in 1week,  (see Discharge instructions)   PCP Please follow up on the following pending results:    Home Health: None   Equipment/Devices: None  Consultations: PCCM, Cards Discharge Condition: Stable    CODE STATUS: Full    Diet Recommendation: Heart Healthy Low Carb, check CBGs q. [facility].    Chief Complaint  Patient presents with   Respiratory Distress    Patient to ED via EMS from [facility] assisted living with complaint of difficulty breathing. EMS reports patient was 84% on 3 liters. Patient uses 3 liters nasal canula O2 at baseline. EMS gave 125 mg solu medrol and 2 grams magnesium sulfate through and 18 in left bicep. Patient arrives with duo neb treatment being administered.     Brief history of present illness from the day of admission and additional interim summary    72 year old male with known history of COPD on 3 to 4 L home oxygen at all times, OSA on nocturnal BiPAP, history of bladder cancer, BPH, GERD, CKD 3B with baseline creatinine around 1.5, history of DVT in the remote past, GERD, dyslipidemia, hypertension who was recently admitted to [facility] initially on 05/06/2023 for syncope, he had a thorough workup and was subsequently discharged to a nursing home on 11/08/2023.    Resented a day later again to [facility] from the nursing home with respiratory distress, he was found to be in CHF, he was intubated and transferred to [facility], ICU, he developed some bradycardia in  ICU, he was also seen by cardiology.  Overall stabilized and transferred to hospitalist service under my care on 05/15/2023.                                                                   Hospital Course   Acute on chronic hypoxic and hypercapnic respiratory failure thought to be due to combination of acute on chronic diastolic CHF, chronic underlying pulmonary fibrosis, possible mild COPD exacerbation and underlying OSA.   He required endotracheal intubation, admission in ICU, was diuresed, eventually stabilized transition back to 3 to 4 L nasal cannula oxygen in daytime which is his baseline.  CHF and COPD exacerbation seem to have resolved.  Now at baseline.  Will be discharged to SNF on baseline 4 L nasal cannula oxygen at all times.  Obesity.  BMI 40 with OSA.  Nightly CPAP, follow-up with PCP for weight loss.   Syncope last admission, now bradycardia since  he was in ICU.  Initially thought to be due to propofol, stable TSH, EKG stable, echo stable, troponin negative, not on any rate controlling agents.  Could be effect of propofol which is longer than expected due to underlying renal insufficiency, will check for chronotropic response, appreciate cardiology input. Heart rate is improving, per cardiology stable for discharge with outpatient follow-up in their office.   Acute on chronic diastolic mild RV dysfunction EF.  60%.  Now compensated, monitor fluid status closely, as needed diuretic may be needed based on clinical scenario.   History of chronic hypoxic respiratory failure, COPD, pulmonary fibrosis.  OSA.  On 3 to 4 L of nasal cannula oxygen at baseline which will be continued, nighttime CPAP, continue bronchodilators including budesonide, Brovana, Yupelri, close to baseline now.   AKI on CKD 3B  - Cr baseline 1.5, AKI resolved close to baseline now.   History of solitary kidney  History of bladder cancer -  Stable   HLD  -Continue statin   BPH  -Continue tamsulosin    Type 2  diabetes - Semglee and sliding scale, check CBGs closely at SNF and adjust dose as needed.  Discharge diagnosis     Principal Problem:   Acute hypoxic respiratory failure (HCC) Active Problems:   COPD (chronic obstructive pulmonary disease) (HCC)   Chronic systolic heart failure (HCC)   HTN (hypertension)   CAD (coronary artery disease)   CKD stage 3a, GFR 45-59 ml/min (HCC)   BPH (benign prostatic hyperplasia)   Morbid obesity with BMI of 40.0-44.9, adult (HCC)   Hyperlipidemia associated with type 2 diabetes mellitus (HCC)   OSA (obstructive sleep apnea)   Bladder cancer (HCC)   Acute on chronic respiratory failure with hypoxia and hypercapnia (HCC)   Bradycardia    Discharge instructions    Discharge Instructions     Discharge instructions   Complete by: As directed    Follow with Primary MD [name], MD in 7 days   Get CBC, CMP, 2 view Chest X ray -  checked next visit with your primary MD or SNF MD   Activity: As tolerated with Full fall precautions use walker/cane & assistance as needed  Disposition SNF  Diet: Heart Healthy Low Carb, check CBGs q. [facility]  Special Instructions: If you have smoked or chewed Tobacco  in the last 2 yrs please stop smoking, stop any regular Alcohol  and or any Recreational drug use.  On your next visit with your primary care physician please Get Medicines reviewed and adjusted.  Please request your Prim.MD to go over all Hospital Tests and Procedure/Radiological results at the follow up, please get all Hospital records sent to your Prim MD by signing hospital release before you go home.  If you experience worsening of your admission symptoms, develop shortness of breath, life threatening emergency, suicidal or homicidal thoughts you must seek medical attention immediately by calling 911 or calling your MD immediately  if symptoms less severe.  You Must read complete instructions/literature along with all the possible  adverse reactions/side effects for all the Medicines you take and that have been prescribed to you. Take any new Medicines after you have completely understood and accpet all the possible adverse reactions/side effects.   Increase activity slowly   Complete by: As directed        Discharge Medications   Allergies as of 05/18/2023       Reactions   Poison Ivy Extract [poison Ivy Extract] Rash  Poison Oak Extract Rash        Medication List     STOP taking these medications    Alavert 10 MG dissolvable tablet Generic drug: loratadine   furosemide 20 MG tablet Commonly known as: LASIX   HumuLIN R U-500 KwikPen 500 UNIT/ML KwikPen Generic drug: insulin regular human CONCENTRATED   Jardiance 25 MG Tabs tablet Generic drug: empagliflozin   MOUNJARO [place]   rOPINIRole 0.5 MG tablet Commonly known as: REQUIP   rOPINIRole 1 MG tablet Commonly known as: REQUIP       TAKE these medications    albuterol 108 (90 Base) MCG/ACT inhaler Commonly known as: VENTOLIN HFA Inhale 2 puffs into the lungs every 6 (six) hours as needed for wheezing or shortness of breath.   aspirin 81 MG chewable tablet Chew 1 tablet (81 mg total) by mouth 2 (two) times daily after a meal. What changed: when to take this   atorvastatin 80 MG tablet Commonly known as: LIPITOR Take 80 mg by mouth at bedtime.   cholecalciferol 1000 units tablet Commonly known as: VITAMIN D Take 1,000 Units by mouth daily.   ferrous sulfate 325 (65 FE) MG tablet Take 325 mg by mouth daily.   fluocinonide 0.05 % external solution Commonly known as: LIDEX Apply 1 Application topically daily as needed (itchy rash on scalp).   fluticasone 50 MCG/ACT nasal spray Commonly known as: FLONASE Place 1-2 sprays into both nostrils in the morning.   Fluticasone-Salmeterol 250-50 MCG/DOSE Aepb Commonly known as: ADVAIR Inhale 1 puff into the lungs daily.   gabapentin 300 MG capsule Commonly known as: NEURONTIN Take  600 mg by mouth 3 (three) times daily.   hydrocortisone 2.5 % cream Apply 1 Application topically 2 (two) times daily as needed (rash on face).   insulin aspart 100 UNIT/ML FlexPen Commonly known as: NOVOLOG Before each meal 3 times a day, 140-199 - 2 units, 200-250 - 6 units, 251-299 - 8 units,  300-349 - 10 units,  350 or above 14 units.   insulin glargine 100 UNIT/ML injection Commonly known as: Lantus Inject 0.4 mLs (40 Units total) into the skin 2 (two) times daily.   ketoconazole 2 % shampoo Commonly known as: NIZORAL Apply 1 Application topically every Monday, Wednesday, and Friday.   OXYGEN Inhale 2-3 L/min into the lungs See admin instructions. 2 L/min continuous. May increase to 3 L/min as needed for shortness of breath.   Spiriva Respimat 2.5 MCG/ACT Aers Generic drug: Tiotropium Bromide Monohydrate Inhale 2 each into the lungs daily as needed (wheezing, shortness of breath.).   tamsulosin 0.4 MG Caps capsule Commonly known as: FLOMAX Take 0.4 mg by mouth at bedtime.         Follow-up Information     Ronney Asters, NP Follow up on 06/15/2023.   Specialty: Cardiology Why: at 8:25am for your cardiology follow up appointment Contact information: [address] [place] [address] [phone]         [name], MD. Schedule an appointment as soon as possible for a visit in 1 week(s).   Specialty: Student Contact information: [address] [place] [address] [phone]                 Major procedures and Radiology Reports - PLEASE review detailed and final reports thoroughly  -      DG Chest [facility] 1 View  Result Date: 05/15/2023 CLINICAL DATA:  Shortness of breath. EXAM: PORTABLE CHEST  1 VIEW COMPARISON:  05/13/2023 FINDINGS: The lungs are clear without focal pneumonia, edema, pneumothorax or pleural effusion. Interstitial markings are diffusely prominent. The cardio pericardial silhouette is enlarged. No  acute bony abnormality. Telemetry leads overlie the chest. IMPRESSION: Diffuse interstitial prominence, likely chronic although component of interstitial edema not excluded. Electronically Signed   By: Kennith Center M.D.   On: 05/15/2023 06:38   DG Chest Port 1 View  Result Date: 05/13/2023 CLINICAL DATA:  72 year old male with history of shortness of breath following extubation. EXAM: PORTABLE CHEST 1 VIEW COMPARISON:  Chest x-ray 05/11/2023. FINDINGS: Patient has been extubated. Previously noted nasogastric tube has also been removed. Lung volumes are normal. Patchy areas of interstitial prominence, widespread peribronchial cuffing and patchy ill-defined opacities are again noted in the lungs, most evident in the periphery of the left lung base at the costophrenic sulcus. Overall, aeration appears slightly improved. No definite pleural effusions. Mild cephalization of the pulmonary vasculature. Mild cardiomegaly. The patient is rotated to the right on today's exam, resulting in distortion of the mediastinal contours and reduced diagnostic sensitivity and specificity for mediastinal pathology. IMPRESSION: 1. Improving aeration in the lungs likely reflecting resolving congestive heart failure. Electronically Signed   By: Trudie Reed M.D.   On: 05/13/2023 07:35   ECHOCARDIOGRAM COMPLETE  Result Date: 05/11/2023    ECHOCARDIOGRAM REPORT   Patient Name:   BASHAN CUTRONE Sr. Date of Exam: 05/11/2023 Medical Rec #:  [id number]          Height:       68.0 in Accession #:    [id number]         Weight:       264.8 lb Date of Birth:  [birth year]          BSA:          2.303 m Patient Age:    72 years           BP:           112/60 mmHg Patient Gender: M                  HR:           51 bpm. Exam Location:  Inpatient Procedure: 2D Echo, Color Doppler, Cardiac Doppler and Intracardiac            Opacification Agent Indications:    Shock  History:        Patient has no prior history of Echocardiogram examinations.                  CAD, COPD and CKD, Signs/Symptoms:Syncope; Risk Factors:Sleep                 Apnea, Dyslipidemia, Hypertension and Diabetes.  Sonographer:    Milbert Coulter Referring Phys: [id number] WILLIAM S MINOR  Sonographer Comments: Echo performed with patient supine and on artificial respirator, patient is obese, suboptimal apical window and suboptimal parasternal window. Image acquisition challenging due to COPD. IMPRESSIONS  1. Left ventricular ejection fraction, by estimation, is 55 to 60%. The left ventricle has normal function. The left ventricle has no regional wall motion abnormalities. Left ventricular diastolic parameters are indeterminate.  2. Right ventricular systolic function is mildly reduced. The right ventricular size is normal.  3. The mitral valve is normal in structure. No evidence of mitral valve regurgitation. No evidence of mitral stenosis. Moderate mitral annular calcification.  4. The aortic valve was not well visualized. There is moderate  calcification of the aortic valve. Aortic valve regurgitation is not visualized. No aortic stenosis is present.  5. The inferior vena cava is dilated in size with <50% respiratory variability, suggesting right atrial pressure of 15 mmHg.  6. Very limited study due to poor sound wave transmission. FINDINGS  Left Ventricle: Left ventricular ejection fraction, by estimation, is 55 to 60%. The left ventricle has normal function. The left ventricle has no regional wall motion abnormalities. Definity contrast agent was given IV to delineate the left ventricular  endocardial borders. The left ventricular internal cavity size was normal in size. There is no left ventricular hypertrophy. Left ventricular diastolic parameters are indeterminate. Right Ventricle: The right ventricular size is normal. No increase in right ventricular wall thickness. Right ventricular systolic function is mildly reduced. Left Atrium: Left atrial size was normal in size. Right Atrium: Right  atrial size was normal in size. Pericardium: There is no evidence of pericardial effusion. Mitral Valve: The mitral valve is normal in structure. Moderate mitral annular calcification. No evidence of mitral valve regurgitation. No evidence of mitral valve stenosis. Tricuspid Valve: The tricuspid valve is not well visualized. Tricuspid valve regurgitation is trivial. No evidence of tricuspid stenosis. Aortic Valve: The aortic valve was not well visualized. There is moderate calcification of the aortic valve. Aortic valve regurgitation is not visualized. No aortic stenosis is present. Aortic valve mean gradient measures 5.0 mmHg. Aortic valve peak gradient measures 9.5 mmHg. Pulmonic Valve: The pulmonic valve was not well visualized. Pulmonic valve regurgitation is not visualized. No evidence of pulmonic stenosis. Aorta: The aortic root is normal in size and structure. Venous: The inferior vena cava is dilated in size with less than 50% respiratory variability, suggesting right atrial pressure of 15 mmHg. IAS/Shunts: No atrial level shunt detected by color flow Doppler.  LEFT VENTRICLE PLAX 2D LVIDd:         5.90 cm Diastology LVIDs:         3.90 cm LV e' medial:    6.20 cm/s LV PW:         0.90 cm LV E/e' medial:  10.3 LV IVS:        0.90 cm LV e' lateral:   9.90 cm/s                        LV E/e' lateral: 6.5  RIGHT VENTRICLE RV S prime:     13.50 cm/s TAPSE (M-mode): 1.9 cm AORTIC VALVE AV Vmax:           154.00 cm/s AV Vmean:          97.900 cm/s AV VTI:            0.276 m AV Peak Grad:      9.5 mmHg AV Mean Grad:      5.0 mmHg LVOT Vmax:         105.00 cm/s LVOT Vmean:        66.600 cm/s LVOT VTI:          0.212 m LVOT/AV VTI ratio: 0.77 MITRAL VALVE MV Area (PHT): 2.76 cm    SHUNTS MV Decel Time: 275 msec    Systemic VTI: 0.21 m MV E velocity: 63.90 cm/s MV A velocity: 68.70 cm/s MV E/A ratio:  0.93 Arvilla Meres MD Electronically signed by Arvilla Meres MD Signature Date/Time: 05/11/2023/3:26:19 PM     Final    DG Chest Portable 1 View  Result Date: 05/11/2023 CLINICAL DATA:  72 year old  male status post intubation. EXAM: PORTABLE CHEST 1 VIEW COMPARISON:  Chest x-ray 05/11/2023. FINDINGS: An endotracheal tube is in place with tip 5.2 cm above the carina. A nasogastric tube is seen extending into the stomach, however, the tip of the nasogastric tube extends below the lower margin of the image. Lung volumes are slightly low. No confluent consolidative airspace disease. There is cephalization of the pulmonary vasculature and slight indistinctness of the interstitial markings suggestive of mild pulmonary edema. No definite pleural effusions. Mild cardiomegaly. The patient is rotated to the right on today's exam, resulting in distortion of the mediastinal contours and reduced diagnostic sensitivity and specificity for mediastinal pathology. IMPRESSION: 1. Support apparatus, as above. 2. The appearance the chest suggests mild congestive heart failure, as above. Electronically Signed   By: Trudie Reed M.D.   On: 05/11/2023 06:57   CT Angio Chest PE W and/or Wo Contrast  Result Date: 05/11/2023 CLINICAL DATA:  72 year old male with altered mental status, shortness of breath difficulty breathing. EXAM: CT ANGIOGRAPHY CHEST WITH CONTRAST TECHNIQUE: Multidetector CT imaging of the chest was performed using the standard protocol during bolus administration of intravenous contrast. Multiplanar CT image reconstructions and MIPs were obtained to evaluate the vascular anatomy. RADIATION DOSE REDUCTION: This exam was performed according to the departmental dose-optimization program which includes automated exposure control, adjustment of the mA and/or kV according to patient size and/or use of iterative reconstruction technique. CONTRAST:  75mL OMNIPAQUE IOHEXOL 350 MG/ML SOLN COMPARISON:  portable chest 0006 hours today and earlier. Previous CTA chest 01/08/2015. FINDINGS: Cardiovascular: Suboptimal contrast bolus  timing in the pulmonary arterial tree, substantially less than on the 2016 CTA. There is adequate contrast to exclude central, saddle, and lobar pulmonary artery thrombus. But the subsegmental and distal branches are poorly evaluated. There is underlying chronic central pulmonary artery enlargement suggesting a degree of pulmonary artery hypertension. Calcified coronary artery atherosclerosis series 7, image 214. Mild Calcified aortic atherosclerosis. Mild to moderate cardiomegaly does not appear significantly changed from 2016. No pericardial effusion. Mediastinum/Nodes: Lungs/Pleura: Similar lung volumes to 2016 with atelectatic changes to the trachea and other major airways, but the major airways remain patent. Chronic mosaic attenuation in the lungs, most apparent in the upper lobes. Additional subpleural scarring and/or atelectasis bilaterally. No pleural effusion. No consolidation. Upper Abdomen: Mild hepatomegaly appears stable since 2016. Spleen size seems to remain within normal limits. Partially visible punctate cholelithiasis. Negative visible adrenal glands, pancreas, right kidney, and bowel in the upper abdomen. Musculoskeletal: Widespread degeneration in the spine. But largely stable appearance of the thoracic spine since 2016. Chronic left lateral 8th and 9th rib fractures. No acute or suspicious osseous lesion. Review of the MIP images confirms the above findings. IMPRESSION: 1. Relatively poor contrast bolus timing. No saddle or lobar pulmonary embolus. But the segmental and distal pulmonary branches are not evaluated. 2. Chronic central pulmonary artery enlargement suggesting pulmonary artery hypertension. And chronic pulmonary mosaic attenuation, similar to a 2016 CTA, which could reflect either chronic small vessel or small airway disease. 3. Atelectasis, but no other superimposed acute pulmonary abnormality. 4. Calcified coronary artery atherosclerosis. Mild aortic Atherosclerosis (ICD10-I70.0).  Electronically Signed   By: Odessa Fleming M.D.   On: 05/11/2023 04:53   CT Head Wo Contrast  Result Date: 05/11/2023 CLINICAL DATA:  72 year old male with altered mental status, shortness of breath difficulty breathing. EXAM: CT HEAD WITHOUT CONTRAST TECHNIQUE: Contiguous axial images were obtained from the base of the skull through the vertex without intravenous contrast.  RADIATION DOSE REDUCTION: This exam was performed according to the departmental dose-optimization program which includes automated exposure control, adjustment of the mA and/or kV according to patient size and/or use of iterative reconstruction technique. COMPARISON:  None Available. FINDINGS: Brain: No midline shift, ventriculomegaly, mass effect, evidence of mass lesion, intracranial hemorrhage or evidence of cortically based acute infarction. Cerebral volume is within normal limits for age. Mild to moderate patchy bilateral cerebral white matter hypodensity, but maintained gray-white differentiation throughout. No cortical encephalomalacia. Deep gray nuclei, brainstem and cerebellum remain within normal limits. Vascular: No suspicious intracranial vascular hyperdensity. Mild Calcified atherosclerosis at the skull base. Skull: Negative. Sinuses/Orbits: Visualized paranasal sinuses and mastoids are well aerated. Other: No acute orbit or scalp soft tissue finding. Left parotid sialolithiasis partially visible. IMPRESSION: 1. No acute intracranial abnormality. 2. Mild to moderate for age cerebral white matter changes, most commonly due to chronic small vessel disease. Electronically Signed   By: Odessa Fleming M.D.   On: 05/11/2023 04:46   DG Chest Portable 1 View  Result Date: 05/11/2023 CLINICAL DATA:  Shortness of breath. EXAM: PORTABLE CHEST 1 VIEW COMPARISON:  May 09, 2023 FINDINGS: The cardiac silhouette is mildly enlarged and unchanged in size. Mild atelectasis is seen within the bilateral lung bases. This is mildly increased in severity when  compared to the prior study. No pleural effusion or pneumothorax is identified. The visualized skeletal structures are unremarkable. IMPRESSION: Mild bibasilar atelectasis. Electronically Signed   By: Aram Candela M.D.   On: 05/11/2023 00:25   DG CHEST PORT 1 VIEW  Result Date: 05/09/2023 CLINICAL DATA:  Dyspnea and respiratory abnormalities. EXAM: PORTABLE CHEST 1 VIEW COMPARISON:  Radiograph 05/07/2023 FINDINGS: Chronic shot unchanged cardiomegaly. Stable mediastinal contours. Lower lung volumes from prior exam. Vascular congestion again seen, unchanged allowing for differences in technique. Minor left lung base atelectasis. No developing consolidation. No pleural fluid or pneumothorax. IMPRESSION: 1. Lower lung volumes from prior exam. Unchanged cardiomegaly and vascular congestion. 2. Minor left lung base atelectasis. Electronically Signed   By: Narda Rutherford M.D.   On: 05/09/2023 15:23   DG Chest 1 View  Result Date: 05/07/2023 CLINICAL DATA:  Syncope EXAM: CHEST  1 VIEW COMPARISON:  X-ray 05/06/2023 FINDINGS: Stable enlarged cardiopericardial silhouette with vascular congestion. No consolidation, pneumothorax or effusion. No edema. Overlapping cardiac leads. Previous basilar opacity on the right is improved. IMPRESSION: Improving basilar opacity. Enlarged heart with vascular congestion. Electronically Signed   By: Karen Kays M.D.   On: 05/07/2023 17:06   DG Toe Great Left  Result Date: 05/06/2023 CLINICAL DATA:  Status post trauma 2 days ago. EXAM: LEFT GREAT TOE COMPARISON:  None Available. FINDINGS: There is no evidence of fracture or dislocation. There is no evidence of arthropathy or other focal bone abnormality. Soft tissues are unremarkable. IMPRESSION: Negative. Electronically Signed   By: Aram Candela M.D.   On: 05/06/2023 19:36   DG Chest Port 1 View  Result Date: 05/06/2023 CLINICAL DATA:  Syncope and elevated temperature. EXAM: PORTABLE CHEST 1 VIEW COMPARISON:  February 10, 2017 FINDINGS: The cardiac silhouette is mildly enlarged and unchanged in size. Mild atelectasis and/or infiltrate is seen within the right lung base. There is no evidence of a pleural effusion or pneumothorax. The visualized skeletal structures are unremarkable. IMPRESSION: Mild right basilar atelectasis and/or infiltrate. Electronically Signed   By: Aram Candela M.D.   On: 05/06/2023 19:35     Today   Subjective    Benet Kitchell today has  no headache,no chest abdominal pain,no new weakness tingling or numbness, feels much better wants to go home today.    Objective   Blood pressure (!) 123/58, pulse 54 temperature 98.6 F (37 C), temperature source Oral, resp. rate 20, height 5\' 8"  (1.727 m), weight 121 kg, SpO2 93 %.   Intake/Output Summary (Last 24 hours) at 05/18/2023 0829 Last data filed at 05/18/2023 0600 Gross per 24 hour  Intake --  Output 1600 ml  Net -1600 ml    Exam  Awake Alert, No new F.N deficits,    [place].AT,PERRAL Supple Neck,   Symmetrical Chest wall movement, Good air movement bilaterally, CTAB RRR,No Gallops,   +ve B.Sounds, Abd Soft, Non tender,  No Cyanosis, Clubbing or edema    Data Review   Recent Labs  Lab 05/13/23 0133 05/15/23 0330 05/16/23 0307 05/17/23 0246 05/18/23 0449  WBC 6.5 4.0 4.2 4.4 4.2  HGB 13.4 12.5* 12.1* 12.8* 12.2*  HCT 45.7 40.7 40.0 42.0 40.1  PLT 164 137* 127* 143* 142*  MCV 98.9 96.7 94.6 94.0 93.5  MCH 29.0 29.7 28.6 28.6 28.4  MCHC 29.3* 30.7 30.3 30.5 30.4  RDW 14.1 13.4 13.4 13.4 13.7  LYMPHSABS 1.1  --  0.7 0.9 0.7  MONOABS 0.6  --  0.4 0.4 0.4  EOSABS 0.1  --  0.2 0.2 0.2  BASOSABS 0.0  --  0.0 0.0 0.0    Recent Labs  Lab 05/13/23 0133 05/14/23 0130 05/15/23 0330 05/15/23 0629 05/16/23 0307 05/17/23 0246 05/18/23 0449  NA 137 136 140  --  139 137 137  K 3.8 4.2 4.5  --  4.5 4.2 4.6  CL 94* 100 99  --  99 98 100  CO2 26 28 29   --  29 31 29   ANIONGAP 17* 8 12  --  11 8 8   GLUCOSE 209* 317* 221*   --  196* 186* 334*  BUN 60* 49* 41*  --  35* 31* 30*  CREATININE 1.74* 1.47* 1.57*  --  1.28* 1.37* 1.21  CRP  --   --   --  3.5* 3.3* 3.9* 3.6*  PROCALCITON  --   --   --  <0.10 0.13 0.13 0.13  TSH  --  2.197  --   --   --   --   --   BNP  --   --   --  72.0 40.8 35.1 34.0  MG 2.4  --   --  2.1 2.0 2.0 1.9  CALCIUM 9.2 8.6* 9.0  --  9.0 9.0 8.9    Total Time in preparing paper work, data evaluation and todays exam - 35 minutes  Signature  -    Susa Raring M.D on 05/18/2023 at 8:29 AM   -  To page go to [email or web address]

## 2023-05-19 DIAGNOSIS — R55 Syncope and collapse: Secondary | ICD-10-CM | POA: Diagnosis not present

## 2023-05-23 DIAGNOSIS — E1169 Type 2 diabetes mellitus with other specified complication: Secondary | ICD-10-CM | POA: Diagnosis not present

## 2023-05-23 DIAGNOSIS — R2689 Other abnormalities of gait and mobility: Secondary | ICD-10-CM

## 2023-05-23 DIAGNOSIS — M6281 Muscle weakness (generalized): Secondary | ICD-10-CM

## 2023-05-23 DIAGNOSIS — B372 Candidiasis of skin and nail: Secondary | ICD-10-CM

## 2023-05-23 DIAGNOSIS — J9690 Respiratory failure, unspecified, unspecified whether with hypoxia or hypercapnia: Secondary | ICD-10-CM | POA: Diagnosis not present

## 2023-05-23 DIAGNOSIS — J449 Chronic obstructive pulmonary disease, unspecified: Secondary | ICD-10-CM

## 2023-05-23 DIAGNOSIS — I10 Essential (primary) hypertension: Secondary | ICD-10-CM | POA: Diagnosis not present

## 2023-05-23 DIAGNOSIS — J189 Pneumonia, unspecified organism: Secondary | ICD-10-CM | POA: Diagnosis not present

## 2023-05-24 DIAGNOSIS — B372 Candidiasis of skin and nail: Secondary | ICD-10-CM

## 2023-05-24 DIAGNOSIS — J449 Chronic obstructive pulmonary disease, unspecified: Secondary | ICD-10-CM | POA: Diagnosis not present

## 2023-05-24 DIAGNOSIS — R2689 Other abnormalities of gait and mobility: Secondary | ICD-10-CM

## 2023-05-24 DIAGNOSIS — I10 Essential (primary) hypertension: Secondary | ICD-10-CM | POA: Diagnosis not present

## 2023-05-24 DIAGNOSIS — J189 Pneumonia, unspecified organism: Secondary | ICD-10-CM | POA: Diagnosis not present

## 2023-05-24 DIAGNOSIS — M6281 Muscle weakness (generalized): Secondary | ICD-10-CM

## 2023-05-24 DIAGNOSIS — I82532 Chronic embolism and thrombosis of left popliteal vein: Secondary | ICD-10-CM

## 2023-05-24 DIAGNOSIS — E1169 Type 2 diabetes mellitus with other specified complication: Secondary | ICD-10-CM | POA: Diagnosis not present

## 2023-06-08 NOTE — Progress Notes (Signed)
Cardiology Clinic Note   Patient Name: Ricky COMPEAN Sr. Date of Encounter: 06/15/2023  Primary Care Provider:  [name], MD Primary Cardiologist:  Olga Millers, MD  Patient Profile    Ricky Course Sr. 72 year old male presents the clinic today for follow-up evaluation of his chronic systolic CHF and coronary artery disease.  Past Medical History    Past Medical History:  Diagnosis Date   Arthritis    Cancer (HCC)    bladder cancer currently 2016   Complication of anesthesia    hard for him to wake up from Anesthesia from left nephrectomy   COPD (chronic obstructive pulmonary disease) (HCC)    Diabetes mellitus without complication (HCC)    DVT (deep venous thrombosis) (HCC) 01/08/2014, 2010   upper extremity   GERD (gastroesophageal reflux disease)    Hallux limitus 05/18/2015   from notes from [facility]    Headache    migraines   History of kidney stones    Hypercholesterolemia    Hypertension    Impaired hearing    Intervertebral disc syndrome    Kidney stones    Pulmonary fibrosis (HCC)    Renal calculi 01/08/2014   frrom noted from [facility] .in chart   Restless legs 01/08/2014   Sciatic leg pain    paralysis of sciatic nerve   Sleep apnea    CPAP/BIPAP   Tinnitus    Past Surgical History:  Procedure Laterality Date   ABDOMINAL SURGERY     BACK SURGERY     BLADDER REPAIR     EYE SURGERY     HERNIA REPAIR     LEFT HEART CATHETERIZATION WITH CORONARY ANGIOGRAM N/A 01/09/2015   Procedure: LEFT HEART CATHETERIZATION WITH CORONARY ANGIOGRAM;  Surgeon: Robynn Pane, MD;  Location: [facility] CATH LAB;  Service: Cardiovascular;  Laterality: N/A;   NEPHROURETERECTOMY  03/18/2016   with bladder cuff excision- from noted in chart from [facility]   TOTAL HIP ARTHROPLASTY Left 02/10/2017   Procedure: LEFT TOTAL HIP ARTHROPLASTY ANTERIOR APPROACH;  Surgeon: Kathryne Hitch, MD;  Location: [facility] ORS;  Service: Orthopedics;  Laterality: Left;     Allergies  Allergies  Allergen Reactions   Poison Ivy Extract [Poison Ivy Extract] Rash   Poison Oak Extract Rash    History of Present Illness    Ricky RHO Sr. has a PMH of active bladder cancer, HTN, HLD, chronic systolic CHF, CAD, COPD, respiratory failure, OSA, esophageal reflux, IDDM, type 2 diabetes anemia, morbid obesity, syncope, and bradycardia.  He was admitted to the hospital on 05/06/2023 and discharged on 05/10/2023.  He presented with reports of syncopal event.  This was felt to be orthostatic or vasovagal in nature.  He was noted to have severe ambulatory dysfunction and weakness.  He was also found to have fluid volume overload.   He was readmitted on 05/11/2023 until 05/18/2023.  He was admitted with respiratory distress.  He was noted to have CHF exacerbation.  He required intubation and was transferred to [facility], ICU.  He developed bradycardia and cardiology was consulted.  He stabilized and was transferred to internal medicine service.  He received diuresis and was stabilized on 3-4 L nasal cannula.  His CHF and COPD exacerbation resolved.  His initial syncopal episode was felt to be related to propofol versus vasovagal syncope.  His TSH, EKG and echocardiogram as well as troponins were stable/negative.  He was not on beta-blocker therapy.  Due to  his decreased renal function it was felt that propofol may be contributing to his low heart rate.  At discharge his heart rate was improving.  Outpatient follow-up with cardiology was recommended.  He presents to the clinic today for follow-up evaluation and states he continues to be dizzy since he had his fall.  He presents with his wife.  He is currently at a rehab facility.  He was doing physical therapy and it was stopped but he has now resumed therapy and is doing activities 5 days/week.  He does have bilateral ear erythema and discharge from his right ear.  We reviewed his cardiac event monitor.  His wife expressed  understanding.  I recommended that [facility] and drainage.  I will continue his current medication regimen and plan follow-up in 3 to 4 months.  He is using 3 L of oxygen today via nasal cannula and saturating at 92%.  His blood pressure is well-controlled at 118/78 and his pulse was noted to be 64 bpm.  Today he denies chest pain, shortness of breath, lower extremity edema, fatigue, palpitations, melena, hematuria, hemoptysis, diaphoresis, syncope, orthopnea, and PND.   Home Medications    Prior to Admission medications   Medication Sig Start Date End Date Taking? Authorizing Provider  albuterol (PROVENTIL HFA;VENTOLIN HFA) 108 (90 Base) MCG/ACT inhaler Inhale 2 puffs into the lungs every 6 (six) hours as needed for wheezing or shortness of breath.    [provider]  aspirin 81 MG chewable tablet Chew 1 tablet (81 mg total) by mouth 2 (two) times daily after a meal. Patient taking differently: Chew 81 mg by mouth in the morning and at bedtime. 02/14/17   Kathryne Hitch, MD  atorvastatin (LIPITOR) 80 MG tablet Take 80 mg by mouth at bedtime.    [provider]  cholecalciferol (VITAMIN D) 1000 UNITS tablet Take 1,000 Units by mouth daily.    [provider]  ferrous sulfate 325 (65 FE) MG tablet Take 325 mg by mouth daily.    [provider]  fluocinonide (LIDEX) 0.05 % external solution Apply 1 Application topically daily as needed (itchy rash on scalp).    [provider]  fluticasone (FLONASE) 50 MCG/ACT nasal spray Place 1-2 sprays into both nostrils in the morning. 07/25/18   [provider]  Fluticasone-Salmeterol (ADVAIR) 250-50 MCG/DOSE AEPB Inhale 1 puff into the lungs daily. 05/18/23   Leroy Sea, MD  gabapentin (NEURONTIN) 300 MG capsule Take 600 mg by mouth 3 (three) times daily.    [provider]  hydrocortisone 2.5 % cream Apply 1  Application topically 2 (two) times daily as needed (rash on face).    [provider]  insulin aspart (NOVOLOG) 100 UNIT/ML FlexPen Before each meal 3 times a day, 140-199 - 2 units, 200-250 - 6 units, 251-299 - 8 units,  300-349 - 10 units,  350 or above 14 units. 05/18/23   Leroy Sea, MD  insulin glargine (LANTUS) 100 UNIT/ML injection Inject 0.4 mLs (40 Units total) into the skin 2 (two) times daily. 05/18/23   Leroy Sea, MD  ketoconazole (NIZORAL) 2 % shampoo Apply 1 Application topically every Monday, Wednesday, and Friday.    [provider]  OXYGEN Inhale 2-3 L/min into the lungs See admin instructions. 2 L/min continuous. May increase to 3 L/min as needed for shortness of breath.    [provider]  tamsulosin (FLOMAX) 0.4 MG CAPS capsule Take 0.4 mg by mouth at bedtime.    [provider]  Tiotropium Bromide Monohydrate (SPIRIVA RESPIMAT) 2.5 MCG/ACT AERS Inhale 2 each into the lungs daily as needed (wheezing, shortness of breath.).    [provider]    Family History    History reviewed. No pertinent family history. has no family status information on file.   Social History    Social History   Socioeconomic History   Marital status: Married    Spouse name: Not on file   Number of children: Not on file   Years of education: Not on file   Highest education level: Not on file  Occupational History   Not on file  Tobacco Use   Smoking status: Former    Current packs/day: 0.00    Average packs/day: 1 pack/day for 33.0 years (33.0 ttl pk-yrs)    Types: Cigarettes    Start date: 04/07/1967    Quit date: 04/06/2000    Years since quitting: 23.2   Smokeless tobacco: Never   Tobacco comments:    use to chew cigars  Substance and Sexual Activity   Alcohol use: Not Currently    Comment: rarely   Drug use: Never    Comment: patient states does not use illegal drugs   Sexual activity: Not on file  Other Topics Concern    Not on file  Social History Narrative   Not on file   Social Determinants of Health   Financial Resource Strain: Not on file  Food Insecurity: No Food Insecurity (05/06/2023)   Hunger Vital Sign    Worried About Running Out of Food in the Last Year: Never true    Ran Out of Food in the Last Year: Never true  Transportation Needs: No Transportation Needs (05/06/2023)   PRAPARE - Administrator, Civil Service (Medical): No    Lack of Transportation (Non-Medical): No  Physical Activity: Not on file  Stress: Not on file  Social Connections: Not on file  Intimate Partner Violence: Not At Risk (05/06/2023)   Humiliation, Afraid, Rape, and Kick questionnaire    Fear of Current or Ex-Partner: No    Emotionally Abused: No    Physically Abused: No    Sexually Abused: No     Review of Systems    General:  No chills, fever, night sweats or weight changes.  Cardiovascular:  No chest pain, dyspnea on exertion, edema, orthopnea, palpitations, paroxysmal nocturnal dyspnea. Dermatological: No rash, lesions/masses Respiratory: No cough, dyspnea Urologic: No hematuria, dysuria Abdominal:   No nausea, vomiting, diarrhea, bright red blood per rectum, melena, or hematemesis Neurologic:  No visual changes, wkns, changes in mental status. All other systems reviewed and are otherwise negative except as noted above.  Physical Exam    VS:  BP 118/78 (BP Location: Left Wrist, Patient Position: Sitting, Cuff Size: Normal)   Pulse 64   Ht 5\' 8"  (1.727 m)   Wt 257 lb 3.2 oz (116.7 kg)   SpO2 92%   BMI 39.11 kg/m  , BMI Body mass index is 39.11 kg/m. GEN: Well nourished, well developed, in no acute distress. HEENT: normal. Neck: Supple, no JVD, carotid bruits, or masses. Cardiac: RRR, no murmurs, rubs, or gallops. No clubbing, cyanosis, edema.  Radials/DP/PT 2+ and equal bilaterally.  Respiratory:  Respirations regular and unlabored, clear to auscultation bilaterally. GI: Soft, nontender,  nondistended, BS + x 4. MS: no deformity or atrophy. Skin: warm and dry,  no rash.  bilateral external auricle erythema.  (Drainage from right ear) Neuro:  Strength and sensation are intact. Psych: Normal affect.  Accessory Clinical Findings    Recent Labs: 05/11/2023: ALT 19 05/14/2023: TSH 2.197 05/18/2023: B Natriuretic Peptide 34.0; BUN 30; Creatinine, Ser 1.21; Hemoglobin 12.2; Magnesium 1.9; Platelets 142; Potassium 4.6; Sodium 137   Recent Lipid Panel No results found for: "CHOL", "TRIG", "HDL", "CHOLHDL", "VLDL", "LDLCALC", "LDLDIRECT"       ECG personally reviewed by me today-none today.     Echocardiogram 05/11/2023   IMPRESSIONS     1. Left ventricular ejection fraction, by estimation, is 55 to 60%. The  left ventricle has normal function. The left ventricle has no regional  wall motion abnormalities. Left ventricular diastolic parameters are  indeterminate.   2. Right ventricular systolic function is mildly reduced. The right  ventricular size is normal.   3. The mitral valve is normal in structure. No evidence of mitral valve  regurgitation. No evidence of mitral stenosis. Moderate mitral annular  calcification.   4. The aortic valve was not well visualized. There is moderate  calcification of the aortic valve. Aortic valve regurgitation is not  visualized. No aortic stenosis is present.   5. The inferior vena cava is dilated in size with <50% respiratory  variability, suggesting right atrial pressure of 15 mmHg.   6. Very limited study due to poor sound wave transmission.   FINDINGS   Left Ventricle: Left ventricular ejection fraction, by estimation, is 55  to 60%. The left ventricle has normal function. The left ventricle has no  regional wall motion abnormalities. Definity contrast agent was given IV  to delineate the left ventricular   endocardial borders. The left ventricular internal cavity size was normal  in size. There is no left ventricular  hypertrophy. Left ventricular  diastolic parameters are indeterminate.   Right Ventricle: The right ventricular size is normal. No increase in  right ventricular wall thickness. Right ventricular systolic function is  mildly reduced.   Left Atrium: Left atrial size was normal in size.   Right Atrium: Right atrial size was normal in size.   Pericardium: There is no evidence of pericardial effusion.   Mitral Valve: The mitral valve is normal in structure. Moderate mitral  annular calcification. No evidence of mitral valve regurgitation. No  evidence of mitral valve stenosis.   Tricuspid Valve: The tricuspid valve is not well visualized. Tricuspid  valve regurgitation is trivial. No evidence of tricuspid stenosis.   Aortic Valve: The aortic valve was not well visualized. There is moderate  calcification of the aortic valve. Aortic valve regurgitation is not  visualized. No aortic stenosis is present. Aortic valve mean gradient  measures 5.0 mmHg. Aortic valve peak  gradient measures 9.5 mmHg.   Pulmonic Valve: The pulmonic valve was not well visualized. Pulmonic valve  regurgitation is not visualized. No evidence of pulmonic stenosis.   Aorta: The aortic root is normal in size and structure.   Venous: The inferior vena cava is dilated in size with less than 50%  respiratory variability, suggesting right atrial pressure of 15 mmHg.   IAS/Shunts: No atrial level shunt detected by color flow Doppler.      Assessment & Plan   1.  Acute on chronic diastolic CHF-weight stable.  257.3 pounds today.  Generalized bilateral lower extremity nonpitting edema.  Not a candidate for beta-blocker therapy due to syncope and bradycardia. Daily weights Lower extremity support stockings Elevate lower extremities when not  active  Syncope-reports continuous dizziness.  Has right ear drainage and bilateral external auricle erythema.  Appears that his dizziness may be related to external auditory  canal or middle ear problems.  I recommended that he follow-up with [facility] for further evaluation and treatment.  During his hospitalization his episode was felt to be related to vasovagal syncope versus propofol due to decreased renal function.  TSH stable, troponins negative.  Reviewed cardiac event monitor. Maintain p.o. hydration Change positions slowly Lower extremity support stockings   Bradycardia-heart rate today 64.  Avoid AV nodal blocking agents. Continue to monitor  Hyperlipidemia-follows with PCP. Continue statin therapy  Type 2 diabetes-compliant with insulin. Follows with PCP  COPD-maintains on 3-4 L.  Breathing has returned to baseline.  Denies any increased work of breathing or increased activity intolerance. Continue current medical therapy Follows with PCP  Disposition: Follow-up with Dr. Jens Som or me in 3-4 months.   Thomasene Ripple. Xeng Kucher NP-C     06/15/2023, 9:14 AM [facility] [address] Office [phone] Fax [phone]    I spent examining this patient, reviewing medications, and using patient centered shared decision making involving her cardiac care.  Prior to her visit I spent greater than 20 minutes reviewing her past medical history,  medications, and prior cardiac tests.

## 2023-06-13 DIAGNOSIS — H6693 Otitis media, unspecified, bilateral: Secondary | ICD-10-CM | POA: Diagnosis not present

## 2023-06-13 DIAGNOSIS — H9203 Otalgia, bilateral: Secondary | ICD-10-CM | POA: Diagnosis not present

## 2023-06-15 ENCOUNTER — Encounter: Payer: Self-pay | Admitting: General Practice

## 2023-06-15 ENCOUNTER — Ambulatory Visit: Payer: Medicare PPO | Attending: General Practice | Admitting: General Practice

## 2023-06-15 VITALS — BP 118/78 | HR 64 | Ht 68.0 in | Wt 257.2 lb

## 2023-06-15 DIAGNOSIS — R55 Syncope and collapse: Secondary | ICD-10-CM | POA: Diagnosis not present

## 2023-06-15 DIAGNOSIS — E785 Hyperlipidemia, unspecified: Secondary | ICD-10-CM

## 2023-06-15 DIAGNOSIS — R001 Bradycardia, unspecified: Secondary | ICD-10-CM

## 2023-06-15 DIAGNOSIS — E1169 Type 2 diabetes mellitus with other specified complication: Secondary | ICD-10-CM

## 2023-06-15 DIAGNOSIS — I5033 Acute on chronic diastolic (congestive) heart failure: Secondary | ICD-10-CM

## 2023-06-15 DIAGNOSIS — Z794 Long term (current) use of insulin: Secondary | ICD-10-CM

## 2023-06-15 DIAGNOSIS — J438 Other emphysema: Secondary | ICD-10-CM

## 2023-06-15 NOTE — Patient Instructions (Addendum)
Other Instructions     Recommend you Follow up with [facility] , or [facility] or Ur[facility] care - about  right ear drainage and redness   Increase activity as much as possible     Medication Instructions:   No changes   *If you need a refill on your cardiac medications before your next appointment, please call your pharmacy*   Lab Work: Not needed    Testing/Procedures:   Not needed Follow-Up: At [facility], you and your health needs are our priority.  As part of our continuing mission to provide you with exceptional heart care, we have created designated Provider Care Teams.  These Care Teams include your primary Cardiologist (physician) and Advanced Practice Providers (APPs -  Physician Assistants and Nurse Practitioners) who all work together to provide you with the care you need, when you need it.     Your next appointment:   4 month(s)  The format for your next appointment:   In Person  Provider:   Olga Millers, MD or Edd Fabian NP    Other Instructions     Recommend you Follow up with [facility]NT , or [facility] or Urgent care - about  right ear drainage and redness

## 2023-06-16 DIAGNOSIS — E1169 Type 2 diabetes mellitus with other specified complication: Secondary | ICD-10-CM

## 2023-06-16 DIAGNOSIS — J9690 Respiratory failure, unspecified, unspecified whether with hypoxia or hypercapnia: Secondary | ICD-10-CM

## 2023-06-16 DIAGNOSIS — R2689 Other abnormalities of gait and mobility: Secondary | ICD-10-CM

## 2023-06-16 DIAGNOSIS — I10 Essential (primary) hypertension: Secondary | ICD-10-CM

## 2023-06-16 DIAGNOSIS — M6281 Muscle weakness (generalized): Secondary | ICD-10-CM

## 2023-06-16 DIAGNOSIS — B372 Candidiasis of skin and nail: Secondary | ICD-10-CM

## 2023-06-16 DIAGNOSIS — J189 Pneumonia, unspecified organism: Secondary | ICD-10-CM

## 2023-06-16 DIAGNOSIS — J449 Chronic obstructive pulmonary disease, unspecified: Secondary | ICD-10-CM

## 2023-06-16 DIAGNOSIS — I82532 Chronic embolism and thrombosis of left popliteal vein: Secondary | ICD-10-CM

## 2023-06-18 ENCOUNTER — Emergency Department (HOSPITAL_COMMUNITY): Payer: No Typology Code available for payment source

## 2023-06-18 ENCOUNTER — Other Ambulatory Visit: Payer: Self-pay

## 2023-06-18 ENCOUNTER — Inpatient Hospital Stay (HOSPITAL_COMMUNITY)
Admission: EM | Admit: 2023-06-18 | Discharge: 2023-06-25 | DRG: 208 | Disposition: A | Discharge status: Home Health | Payer: No Typology Code available for payment source | Attending: Internal Medicine | Admitting: Internal Medicine

## 2023-06-18 ENCOUNTER — Encounter (HOSPITAL_COMMUNITY): Payer: Self-pay | Admitting: Emergency Medicine

## 2023-06-18 DIAGNOSIS — D649 Anemia, unspecified: Secondary | ICD-10-CM

## 2023-06-18 DIAGNOSIS — R570 Cardiogenic shock: Secondary | ICD-10-CM | POA: Diagnosis present

## 2023-06-18 DIAGNOSIS — Z794 Long term (current) use of insulin: Secondary | ICD-10-CM

## 2023-06-18 DIAGNOSIS — N179 Acute kidney failure, unspecified: Secondary | ICD-10-CM | POA: Diagnosis present

## 2023-06-18 DIAGNOSIS — J449 Chronic obstructive pulmonary disease, unspecified: Secondary | ICD-10-CM | POA: Diagnosis present

## 2023-06-18 DIAGNOSIS — Z7901 Long term (current) use of anticoagulants: Secondary | ICD-10-CM

## 2023-06-18 DIAGNOSIS — J9622 Acute and chronic respiratory failure with hypercapnia: Secondary | ICD-10-CM | POA: Diagnosis present

## 2023-06-18 DIAGNOSIS — N390 Urinary tract infection, site not specified: Secondary | ICD-10-CM | POA: Diagnosis present

## 2023-06-18 DIAGNOSIS — I5033 Acute on chronic diastolic (congestive) heart failure: Secondary | ICD-10-CM | POA: Diagnosis present

## 2023-06-18 DIAGNOSIS — Z8551 Personal history of malignant neoplasm of bladder: Secondary | ICD-10-CM

## 2023-06-18 DIAGNOSIS — N1831 Chronic kidney disease, stage 3a: Secondary | ICD-10-CM | POA: Diagnosis present

## 2023-06-18 DIAGNOSIS — R0602 Shortness of breath: Secondary | ICD-10-CM | POA: Diagnosis not present

## 2023-06-18 DIAGNOSIS — Z6837 Body mass index (BMI) 37.0-37.9, adult: Secondary | ICD-10-CM

## 2023-06-18 DIAGNOSIS — I959 Hypotension, unspecified: Principal | ICD-10-CM

## 2023-06-18 DIAGNOSIS — K219 Gastro-esophageal reflux disease without esophagitis: Secondary | ICD-10-CM | POA: Diagnosis present

## 2023-06-18 DIAGNOSIS — H6093 Unspecified otitis externa, bilateral: Secondary | ICD-10-CM | POA: Diagnosis present

## 2023-06-18 DIAGNOSIS — Z905 Acquired absence of kidney: Secondary | ICD-10-CM

## 2023-06-18 DIAGNOSIS — L304 Erythema intertrigo: Secondary | ICD-10-CM | POA: Diagnosis present

## 2023-06-18 DIAGNOSIS — G2581 Restless legs syndrome: Secondary | ICD-10-CM | POA: Diagnosis present

## 2023-06-18 DIAGNOSIS — J8410 Pulmonary fibrosis, unspecified: Secondary | ICD-10-CM | POA: Diagnosis present

## 2023-06-18 DIAGNOSIS — E1122 Type 2 diabetes mellitus with diabetic chronic kidney disease: Secondary | ICD-10-CM | POA: Diagnosis present

## 2023-06-18 DIAGNOSIS — E8729 Other acidosis: Secondary | ICD-10-CM | POA: Diagnosis present

## 2023-06-18 DIAGNOSIS — Z1152 Encounter for screening for COVID-19: Secondary | ICD-10-CM

## 2023-06-18 DIAGNOSIS — J984 Other disorders of lung: Secondary | ICD-10-CM | POA: Diagnosis present

## 2023-06-18 DIAGNOSIS — Z7951 Long term (current) use of inhaled steroids: Secondary | ICD-10-CM

## 2023-06-18 DIAGNOSIS — I472 Ventricular tachycardia, unspecified: Secondary | ICD-10-CM | POA: Diagnosis not present

## 2023-06-18 DIAGNOSIS — G4733 Obstructive sleep apnea (adult) (pediatric): Secondary | ICD-10-CM | POA: Diagnosis present

## 2023-06-18 DIAGNOSIS — E7800 Pure hypercholesterolemia, unspecified: Secondary | ICD-10-CM | POA: Diagnosis present

## 2023-06-18 DIAGNOSIS — Z96642 Presence of left artificial hip joint: Secondary | ICD-10-CM | POA: Diagnosis present

## 2023-06-18 DIAGNOSIS — Z86718 Personal history of other venous thrombosis and embolism: Secondary | ICD-10-CM

## 2023-06-18 DIAGNOSIS — G9340 Encephalopathy, unspecified: Secondary | ICD-10-CM

## 2023-06-18 DIAGNOSIS — Z9981 Dependence on supplemental oxygen: Secondary | ICD-10-CM

## 2023-06-18 DIAGNOSIS — E11649 Type 2 diabetes mellitus with hypoglycemia without coma: Secondary | ICD-10-CM | POA: Diagnosis not present

## 2023-06-18 DIAGNOSIS — N289 Disorder of kidney and ureter, unspecified: Secondary | ICD-10-CM

## 2023-06-18 DIAGNOSIS — Z7982 Long term (current) use of aspirin: Secondary | ICD-10-CM

## 2023-06-18 DIAGNOSIS — J9621 Acute and chronic respiratory failure with hypoxia: Secondary | ICD-10-CM | POA: Diagnosis not present

## 2023-06-18 DIAGNOSIS — I130 Hypertensive heart and chronic kidney disease with heart failure and stage 1 through stage 4 chronic kidney disease, or unspecified chronic kidney disease: Secondary | ICD-10-CM | POA: Diagnosis present

## 2023-06-18 DIAGNOSIS — R579 Shock, unspecified: Secondary | ICD-10-CM | POA: Diagnosis present

## 2023-06-18 DIAGNOSIS — Z87891 Personal history of nicotine dependence: Secondary | ICD-10-CM

## 2023-06-18 DIAGNOSIS — I2510 Atherosclerotic heart disease of native coronary artery without angina pectoris: Secondary | ICD-10-CM | POA: Diagnosis present

## 2023-06-18 DIAGNOSIS — Z781 Physical restraint status: Secondary | ICD-10-CM

## 2023-06-18 DIAGNOSIS — Z79899 Other long term (current) drug therapy: Secondary | ICD-10-CM

## 2023-06-18 DIAGNOSIS — G9341 Metabolic encephalopathy: Secondary | ICD-10-CM | POA: Diagnosis present

## 2023-06-18 HISTORY — DX: Heart failure, unspecified: I50.9

## 2023-06-18 HISTORY — DX: Atherosclerotic heart disease of native coronary artery without angina pectoris: I25.10

## 2023-06-18 LAB — COMPREHENSIVE METABOLIC PANEL
ALT: 14 U/L (ref 0–44)
AST: 17 U/L (ref 15–41)
Albumin: 3 g/dL — ABNORMAL LOW (ref 3.5–5.0)
Alkaline Phosphatase: 79 U/L (ref 38–126)
Anion gap: 9 (ref 5–15)
BUN: 29 mg/dL — ABNORMAL HIGH (ref 8–23)
CO2: 32 mmol/L (ref 22–32)
Calcium: 8.3 mg/dL — ABNORMAL LOW (ref 8.9–10.3)
Chloride: 97 mmol/L — ABNORMAL LOW (ref 98–111)
Creatinine, Ser: 1.64 mg/dL — ABNORMAL HIGH (ref 0.61–1.24)
GFR, Estimated: 44 mL/min — ABNORMAL LOW (ref >60)
Glucose, Bld: 217 mg/dL — ABNORMAL HIGH (ref 70–99)
Potassium: 4.2 mmol/L (ref 3.5–5.1)
Sodium: 138 mmol/L (ref 135–145)
Total Bilirubin: 0.7 mg/dL (ref 0.3–1.2)
Total Protein: 7.3 g/dL (ref 6.5–8.1)

## 2023-06-18 LAB — URINALYSIS, W/ REFLEX TO CULTURE (INFECTION SUSPECTED)
Bacteria, UA: NONE SEEN
Bilirubin Urine: NEGATIVE
Glucose, UA: >=500 mg/dL — AB
Ketones, ur: NEGATIVE mg/dL
Nitrite: NEGATIVE
Protein, ur: 30 mg/dL — AB
RBC / HPF: >50 RBC/hpf (ref 0–5)
Specific Gravity, Urine: 1.018 (ref 1.005–1.030)
WBC, UA: >50 WBC/hpf (ref 0–5)
pH: 6 (ref 5.0–8.0)

## 2023-06-18 LAB — CBC WITH DIFFERENTIAL/PLATELET
Abs Immature Granulocytes: 0.03 10*3/uL (ref 0.00–0.07)
Basophils Absolute: 0.1 10*3/uL (ref 0.0–0.1)
Basophils Relative: 1 %
Eosinophils Absolute: 0.1 10*3/uL (ref 0.0–0.5)
Eosinophils Relative: 2 %
HCT: 39.6 % (ref 39.0–52.0)
Hemoglobin: 11.7 g/dL — ABNORMAL LOW (ref 13.0–17.0)
Immature Granulocytes: 1 %
Lymphocytes Relative: 8 %
Lymphs Abs: 0.4 10*3/uL — ABNORMAL LOW (ref 0.7–4.0)
MCH: 29.2 pg (ref 26.0–34.0)
MCHC: 29.5 g/dL — ABNORMAL LOW (ref 30.0–36.0)
MCV: 98.8 fL (ref 80.0–100.0)
Monocytes Absolute: 0.4 10*3/uL (ref 0.1–1.0)
Monocytes Relative: 8 %
Neutro Abs: 4.5 10*3/uL (ref 1.7–7.7)
Neutrophils Relative %: 80 %
Platelets: 164 10*3/uL (ref 150–400)
RBC: 4.01 MIL/uL — ABNORMAL LOW (ref 4.22–5.81)
RDW: 15.2 % (ref 11.5–15.5)
WBC: 5.5 10*3/uL (ref 4.0–10.5)
nRBC: 0 % (ref 0.0–0.2)

## 2023-06-18 LAB — PROTIME-INR
INR: 1.2 (ref 0.8–1.2)
Prothrombin Time: 15.8 seconds — ABNORMAL HIGH (ref 11.4–15.2)

## 2023-06-18 LAB — LACTIC ACID, PLASMA: Lactic Acid, Venous: 1.4 mmol/L (ref 0.5–1.9)

## 2023-06-18 LAB — BRAIN NATRIURETIC PEPTIDE: B Natriuretic Peptide: 54 pg/mL (ref 0.0–100.0)

## 2023-06-18 MED ORDER — SODIUM CHLORIDE 0.9 % IV BOLUS
500.0000 mL | Freq: Once | INTRAVENOUS | Status: AC
  Administered 2023-06-18: 500 mL via INTRAVENOUS

## 2023-06-18 NOTE — ED Provider Notes (Signed)
[place] EMERGENCY DEPARTMENT AT [facility] Provider Note   CSN: [id number] Arrival date & time: 06/18/23  2211     History  Chief Complaint  Patient presents with   Code Sepsis    Ricky Johnston. is a 72 y.o. male.  HPI Patient brought in by EMS.  Reportedly code sepsis.  Reportedly felt warm for EMS.  Just got out of rehab facility.  Reportedly was feeling weak and slid down.  Reported did not hit head.  Hypotensive for EMS.  Temperature not been checked.  Recent admission to the hospital for acute on chronic respiratory failure.  Reported unresponsive.    Home Medications Prior to Admission medications   Medication Sig Start Date End Date Taking? Authorizing Provider  albuterol (PROVENTIL HFA;VENTOLIN HFA) 108 (90 Base) MCG/ACT inhaler Inhale 2 puffs into the lungs every 6 (six) hours as needed for wheezing or shortness of breath.    [provider]  aspirin 81 MG chewable tablet Chew 1 tablet (81 mg total) by mouth 2 (two) times daily after a meal. 02/14/17   Kathryne Hitch, MD  atorvastatin (LIPITOR) 80 MG tablet Take 80 mg by mouth at bedtime.    [provider]  cholecalciferol (VITAMIN D) 1000 UNITS tablet Take 1,000 Units by mouth daily.    [provider]  ferrous sulfate 325 (65 FE) MG tablet Take 325 mg by mouth daily.    [provider]  fluocinonide (LIDEX) 0.05 % external solution Apply 1 Application topically daily as needed (itchy rash on scalp).    [provider]  fluticasone (FLONASE) 50 MCG/ACT nasal spray Place 1-2 sprays into both nostrils in the morning. 07/25/18   [provider]  Fluticasone-Salmeterol (ADVAIR) 250-50 MCG/DOSE AEPB Inhale 1 puff into the lungs daily. 05/18/23   Leroy Sea, MD  gabapentin (NEURONTIN) 300 MG capsule Take 600 mg by mouth 3 (three) times daily.    [provider]  hydrocortisone 2.5 % cream Apply 1 Application topically 2 (two) times  daily as needed (rash on face).    [provider]  insulin aspart (NOVOLOG) 100 UNIT/ML FlexPen Before each meal 3 times a day, 140-199 - 2 units, 200-250 - 6 units, 251-299 - 8 units,  300-349 - 10 units,  350 or above 14 units. 05/18/23   Leroy Sea, MD  insulin glargine (LANTUS) 100 UNIT/ML injection Inject 0.4 mLs (40 Units total) into the skin 2 (two) times daily. 05/18/23   Leroy Sea, MD  ketoconazole (NIZORAL) 2 % shampoo Apply 1 Application topically every Monday, Wednesday, and Friday.    [provider]  OXYGEN Inhale 2-3 L/min into the lungs See admin instructions. 2 L/min continuous. May increase to 3 L/min as needed for shortness of breath.    [provider]  tamsulosin (FLOMAX) 0.4 MG CAPS capsule Take 0.4 mg by mouth at bedtime.    [provider]  Tiotropium Bromide Monohydrate (SPIRIVA RESPIMAT) 2.5 MCG/ACT AERS Inhale 2 each into the lungs daily as needed (wheezing, shortness of breath.).    [provider]      Allergies    Poison ivy extract [poison ivy extract] and Poison oak extract    Review of Systems   Review of Systems  Physical Exam Updated Vital Signs BP (!) 86/50 (BP Location: Left Arm)   Pulse 81   Temp 100.2 F (37.9 C) (Bladder)   Resp (!) 21   Ht 5\' 8"  (1.727  m)   Wt 117 kg   SpO2 94%   BMI 39.22 kg/m  Physical Exam Vitals and nursing note reviewed.  HENT:     Head: Normocephalic and atraumatic.  Cardiovascular:     Rate and Rhythm: Regular rhythm.  Pulmonary:     Comments: Harsh breath sounds. Abdominal:     Tenderness: There is no abdominal tenderness.  Musculoskeletal:     Cervical back: Neck supple.     Right lower leg: Edema present.     Left lower leg: Edema present.  Skin:    General: Skin is warm.  Neurological:     Comments: Sedated but will respond to loud voice.  Will answer questions and move extremities.     ED Results / Procedures / Treatments   Labs (all labs  ordered are listed, but only abnormal results are displayed) Labs Reviewed  COMPREHENSIVE METABOLIC PANEL - Abnormal; Notable for the following components:      Result Value   Chloride 97 (*)    Glucose, Bld 217 (*)    BUN 29 (*)    Creatinine, Ser 1.64 (*)    Calcium 8.3 (*)    Albumin 3.0 (*)    GFR, Estimated 44 (*)    All other components within normal limits  CBC WITH DIFFERENTIAL/PLATELET - Abnormal; Notable for the following components:   RBC 4.01 (*)    Hemoglobin 11.7 (*)    MCHC 29.5 (*)    Lymphs Abs 0.4 (*)    All other components within normal limits  PROTIME-INR - Abnormal; Notable for the following components:   Prothrombin Time 15.8 (*)    All other components within normal limits  URINALYSIS, W/ REFLEX TO CULTURE (INFECTION SUSPECTED) - Abnormal; Notable for the following components:   APPearance HAZY (*)    Glucose, UA >=500 (*)    Hgb urine dipstick MODERATE (*)    Protein, ur 30 (*)    Leukocytes,Ua SMALL (*)    All other components within normal limits  CULTURE, BLOOD (ROUTINE X 2)  CULTURE, BLOOD (ROUTINE X 2)  URINE CULTURE  LACTIC ACID, PLASMA  BRAIN NATRIURETIC PEPTIDE  LACTIC ACID, PLASMA  BLOOD GAS, ARTERIAL    EKG None  Radiology DG Chest Portable 1 View  Result Date: 06/18/2023 CLINICAL DATA:  Code sepsis EXAM: PORTABLE CHEST 1 VIEW COMPARISON:  05/15/2023 FINDINGS: The heart size and mediastinal contours are within normal limits. Both lungs are clear. The visualized skeletal structures are unremarkable. IMPRESSION: No active disease. Electronically Signed   By: Charlett Nose M.D.   On: 06/18/2023 22:43    Procedures Procedures    Medications Ordered in ED Medications  sodium chloride 0.9 % bolus 500 mL (0 mLs Intravenous Stopped 06/18/23 2322)    ED Course/ Medical Decision Making/ A&P                             Medical Decision Making Amount and/or Complexity of Data Reviewed Labs: ordered.   Patient brought in for mental  status change.  Hypotension.  Not febrile.  Normal white count.  Hypotensive for a mass of blood pressures improved somewhat.  Not empirically started on antibiotics.  Urine does not for infection.  Chest x-ray does not show infection.  Has had issues with volume overload and COPD previously.  BNP is normal.  Required fluid bolus.  Started on BiPAP since likely hypoventilating.  Will check ABG.  Care will  be turned over to Dr. Preston Fleeting.   CRITICAL CARE Performed by: Benjiman Core Total critical care time: 30 minutes Critical care time was exclusive of separately billable procedures and treating other patients. Critical care was necessary to treat or prevent imminent or life-threatening deterioration. Critical care was time spent personally by me on the following activities: development of treatment plan with patient and/or surrogate as well as nursing, discussions with consultants, evaluation of patient's response to treatment, examination of patient, obtaining history from patient or surrogate, ordering and performing treatments and interventions, ordering and review of laboratory studies, ordering and review of radiographic studies, pulse oximetry and re-evaluation of patient's condition.         Final Clinical Impression(s) / ED Diagnoses Final diagnoses:  Hypotension, unspecified hypotension type  Encephalopathy    Rx / DC Orders ED Discharge Orders     None         Benjiman Core, MD 06/18/23 2340

## 2023-06-18 NOTE — ED Triage Notes (Signed)
Pt bib EMS for possible code sepsis. EMS reports family called out for unresponsive pt. Family had reported that pt was discharged home yesterday from a rehab facility and was lethargic all day and that they went to stand him, he just "slid down". EMS states that pt was hypotensive on arrival with BP of 74/57 and tachycardic at 105. CBG was 214. Dr Rubin Payor at bedside.

## 2023-06-19 ENCOUNTER — Inpatient Hospital Stay (HOSPITAL_COMMUNITY): Payer: No Typology Code available for payment source

## 2023-06-19 ENCOUNTER — Encounter (HOSPITAL_COMMUNITY): Payer: Self-pay | Admitting: Critical Care Medicine

## 2023-06-19 ENCOUNTER — Emergency Department (HOSPITAL_COMMUNITY): Payer: No Typology Code available for payment source

## 2023-06-19 DIAGNOSIS — I130 Hypertensive heart and chronic kidney disease with heart failure and stage 1 through stage 4 chronic kidney disease, or unspecified chronic kidney disease: Secondary | ICD-10-CM | POA: Diagnosis not present

## 2023-06-19 DIAGNOSIS — R578 Other shock: Secondary | ICD-10-CM

## 2023-06-19 DIAGNOSIS — G9341 Metabolic encephalopathy: Secondary | ICD-10-CM | POA: Diagnosis not present

## 2023-06-19 DIAGNOSIS — Z9981 Dependence on supplemental oxygen: Secondary | ICD-10-CM | POA: Diagnosis not present

## 2023-06-19 DIAGNOSIS — R570 Cardiogenic shock: Secondary | ICD-10-CM | POA: Diagnosis not present

## 2023-06-19 DIAGNOSIS — I361 Nonrheumatic tricuspid (valve) insufficiency: Secondary | ICD-10-CM | POA: Diagnosis not present

## 2023-06-19 DIAGNOSIS — N390 Urinary tract infection, site not specified: Secondary | ICD-10-CM | POA: Diagnosis not present

## 2023-06-19 DIAGNOSIS — I9589 Other hypotension: Secondary | ICD-10-CM

## 2023-06-19 DIAGNOSIS — N289 Disorder of kidney and ureter, unspecified: Secondary | ICD-10-CM

## 2023-06-19 DIAGNOSIS — A419 Sepsis, unspecified organism: Secondary | ICD-10-CM | POA: Diagnosis not present

## 2023-06-19 DIAGNOSIS — R579 Shock, unspecified: Secondary | ICD-10-CM | POA: Diagnosis present

## 2023-06-19 DIAGNOSIS — J9622 Acute and chronic respiratory failure with hypercapnia: Secondary | ICD-10-CM

## 2023-06-19 DIAGNOSIS — I5030 Unspecified diastolic (congestive) heart failure: Secondary | ICD-10-CM

## 2023-06-19 DIAGNOSIS — I5033 Acute on chronic diastolic (congestive) heart failure: Secondary | ICD-10-CM | POA: Diagnosis not present

## 2023-06-19 DIAGNOSIS — Z79899 Other long term (current) drug therapy: Secondary | ICD-10-CM | POA: Diagnosis not present

## 2023-06-19 DIAGNOSIS — G2581 Restless legs syndrome: Secondary | ICD-10-CM | POA: Diagnosis not present

## 2023-06-19 DIAGNOSIS — Z7901 Long term (current) use of anticoagulants: Secondary | ICD-10-CM | POA: Diagnosis not present

## 2023-06-19 DIAGNOSIS — R6521 Severe sepsis with septic shock: Secondary | ICD-10-CM | POA: Diagnosis not present

## 2023-06-19 DIAGNOSIS — E11649 Type 2 diabetes mellitus with hypoglycemia without coma: Secondary | ICD-10-CM | POA: Diagnosis not present

## 2023-06-19 DIAGNOSIS — N1831 Chronic kidney disease, stage 3a: Secondary | ICD-10-CM | POA: Diagnosis not present

## 2023-06-19 DIAGNOSIS — E8729 Other acidosis: Secondary | ICD-10-CM | POA: Diagnosis not present

## 2023-06-19 DIAGNOSIS — E7800 Pure hypercholesterolemia, unspecified: Secondary | ICD-10-CM | POA: Diagnosis not present

## 2023-06-19 DIAGNOSIS — I371 Nonrheumatic pulmonary valve insufficiency: Secondary | ICD-10-CM | POA: Diagnosis not present

## 2023-06-19 DIAGNOSIS — Z794 Long term (current) use of insulin: Secondary | ICD-10-CM | POA: Diagnosis not present

## 2023-06-19 DIAGNOSIS — J9621 Acute and chronic respiratory failure with hypoxia: Secondary | ICD-10-CM

## 2023-06-19 DIAGNOSIS — I2510 Atherosclerotic heart disease of native coronary artery without angina pectoris: Secondary | ICD-10-CM | POA: Diagnosis not present

## 2023-06-19 DIAGNOSIS — Z1152 Encounter for screening for COVID-19: Secondary | ICD-10-CM | POA: Diagnosis not present

## 2023-06-19 DIAGNOSIS — J449 Chronic obstructive pulmonary disease, unspecified: Secondary | ICD-10-CM | POA: Diagnosis not present

## 2023-06-19 DIAGNOSIS — E1122 Type 2 diabetes mellitus with diabetic chronic kidney disease: Secondary | ICD-10-CM | POA: Diagnosis not present

## 2023-06-19 DIAGNOSIS — J8410 Pulmonary fibrosis, unspecified: Secondary | ICD-10-CM | POA: Diagnosis not present

## 2023-06-19 DIAGNOSIS — I509 Heart failure, unspecified: Secondary | ICD-10-CM | POA: Diagnosis not present

## 2023-06-19 DIAGNOSIS — I214 Non-ST elevation (NSTEMI) myocardial infarction: Secondary | ICD-10-CM

## 2023-06-19 DIAGNOSIS — R0602 Shortness of breath: Secondary | ICD-10-CM | POA: Diagnosis present

## 2023-06-19 DIAGNOSIS — I472 Ventricular tachycardia, unspecified: Secondary | ICD-10-CM | POA: Diagnosis not present

## 2023-06-19 DIAGNOSIS — N179 Acute kidney failure, unspecified: Secondary | ICD-10-CM | POA: Diagnosis not present

## 2023-06-19 HISTORY — DX: Disorder of kidney and ureter, unspecified: N28.9

## 2023-06-19 LAB — BLOOD GAS, ARTERIAL
Acid-Base Excess: 10.4 mmol/L — ABNORMAL HIGH (ref 0.0–2.0)
Acid-Base Excess: 9.2 mmol/L — ABNORMAL HIGH (ref 0.0–2.0)
Bicarbonate: 36.7 mmol/L — ABNORMAL HIGH (ref 20.0–28.0)
Bicarbonate: 39.5 mmol/L — ABNORMAL HIGH (ref 20.0–28.0)
O2 Saturation: 93.5 %
O2 Saturation: 98.9 %
Patient temperature: 37
Patient temperature: 37
pCO2 arterial: 62 mmHg — ABNORMAL HIGH (ref 32–48)
pCO2 arterial: 75 mmHg (ref 32–48)
pH, Arterial: 7.33 — ABNORMAL LOW (ref 7.35–7.45)
pH, Arterial: 7.38 (ref 7.35–7.45)
pO2, Arterial: 104 mmHg (ref 83–108)
pO2, Arterial: 63 mmHg — ABNORMAL LOW (ref 83–108)

## 2023-06-19 LAB — COMPREHENSIVE METABOLIC PANEL
ALT: 12 U/L (ref 0–44)
AST: 14 U/L — ABNORMAL LOW (ref 15–41)
Albumin: 2.5 g/dL — ABNORMAL LOW (ref 3.5–5.0)
Alkaline Phosphatase: 70 U/L (ref 38–126)
Anion gap: 7 (ref 5–15)
BUN: 24 mg/dL — ABNORMAL HIGH (ref 8–23)
CO2: 29 mmol/L (ref 22–32)
Calcium: 8 mg/dL — ABNORMAL LOW (ref 8.9–10.3)
Chloride: 103 mmol/L (ref 98–111)
Creatinine, Ser: 1.54 mg/dL — ABNORMAL HIGH (ref 0.61–1.24)
GFR, Estimated: 48 mL/min — ABNORMAL LOW (ref >60)
Glucose, Bld: 109 mg/dL — ABNORMAL HIGH (ref 70–99)
Potassium: 3.8 mmol/L (ref 3.5–5.1)
Sodium: 139 mmol/L (ref 135–145)
Total Bilirubin: 0.6 mg/dL (ref 0.3–1.2)
Total Protein: 6.2 g/dL — ABNORMAL LOW (ref 6.5–8.1)

## 2023-06-19 LAB — ECHOCARDIOGRAM COMPLETE
AR max vel: 2.35 cm2
AV Area VTI: 1.98 cm2
AV Area mean vel: 2.29 cm2
AV Mean grad: 7 mmHg
AV Peak grad: 12.3 mmHg
Ao pk vel: 1.75 m/s
Area-P 1/2: 2.41 cm2
Height: 68 in
S' Lateral: 4.6 cm
Weight: 4229.3 oz

## 2023-06-19 LAB — CBC
HCT: 37.7 % — ABNORMAL LOW (ref 39.0–52.0)
Hemoglobin: 10.9 g/dL — ABNORMAL LOW (ref 13.0–17.0)
MCH: 28 pg (ref 26.0–34.0)
MCHC: 28.9 g/dL — ABNORMAL LOW (ref 30.0–36.0)
MCV: 96.9 fL (ref 80.0–100.0)
Platelets: 166 10*3/uL (ref 150–400)
RBC: 3.89 MIL/uL — ABNORMAL LOW (ref 4.22–5.81)
RDW: 15.4 % (ref 11.5–15.5)
WBC: 5.4 10*3/uL (ref 4.0–10.5)
nRBC: 0 % (ref 0.0–0.2)

## 2023-06-19 LAB — PHOSPHORUS: Phosphorus: 2.8 mg/dL (ref 2.5–4.6)

## 2023-06-19 LAB — RESP PANEL BY RT-PCR (RSV, FLU A&B, COVID)  RVPGX2
Influenza A by PCR: NEGATIVE
Influenza B by PCR: NEGATIVE
Resp Syncytial Virus by PCR: NEGATIVE
SARS Coronavirus 2 by RT PCR: NEGATIVE

## 2023-06-19 LAB — LACTIC ACID, PLASMA: Lactic Acid, Venous: 1.6 mmol/L (ref 0.5–1.9)

## 2023-06-19 LAB — TROPONIN I (HIGH SENSITIVITY)
Troponin I (High Sensitivity): 16 ng/L (ref <18)
Troponin I (High Sensitivity): 16 ng/L (ref <18)

## 2023-06-19 LAB — GLUCOSE, CAPILLARY
Glucose-Capillary: 103 mg/dL — ABNORMAL HIGH (ref 70–99)
Glucose-Capillary: 90 mg/dL (ref 70–99)
Glucose-Capillary: 72 mg/dL (ref 70–99)
Glucose-Capillary: 58 mg/dL — ABNORMAL LOW (ref 70–99)
Glucose-Capillary: 92 mg/dL (ref 70–99)
Glucose-Capillary: 77 mg/dL (ref 70–99)

## 2023-06-19 LAB — CULTURE, BLOOD (ROUTINE X 2): Culture: NO GROWTH

## 2023-06-19 LAB — PROTIME-INR
INR: 1.3 — ABNORMAL HIGH (ref 0.8–1.2)
Prothrombin Time: 16.6 seconds — ABNORMAL HIGH (ref 11.4–15.2)

## 2023-06-19 LAB — TSH: TSH: 1.062 u[IU]/mL (ref 0.350–4.500)

## 2023-06-19 LAB — CORTISOL: Cortisol, Plasma: 10.9 ug/dL

## 2023-06-19 LAB — PROCALCITONIN: Procalcitonin: 0.46 ng/mL

## 2023-06-19 LAB — MRSA NEXT GEN BY PCR, NASAL: MRSA by PCR Next Gen: DETECTED — AB

## 2023-06-19 LAB — MAGNESIUM: Magnesium: 2 mg/dL (ref 1.7–2.4)

## 2023-06-19 MED ORDER — MIDAZOLAM HCL 5 MG/5ML IJ SOLN
5.0000 mg | Freq: Once | INTRAMUSCULAR | Status: AC
  Administered 2023-06-19: 5 mg via INTRAVENOUS
  Filled 2023-06-19: qty 5

## 2023-06-19 MED ORDER — SODIUM CHLORIDE 0.9 % IV SOLN: 250.0000 mL | INTRAVENOUS | Status: DC

## 2023-06-19 MED ORDER — SUCCINYLCHOLINE CHLORIDE 200 MG/10ML IV SOSY: 150.0000 mg | PREFILLED_SYRINGE | Freq: Once | INTRAVENOUS | Status: AC

## 2023-06-19 MED ORDER — FUROSEMIDE 10 MG/ML IJ SOLN
40.0000 mg | Freq: Once | INTRAMUSCULAR | Status: AC
  Administered 2023-06-19: 40 mg via INTRAVENOUS
  Filled 2023-06-19: qty 4

## 2023-06-19 MED ORDER — DEXTROSE 5 % IV SOLN: INTRAVENOUS | Status: DC

## 2023-06-19 MED ORDER — SPIRONOLACTONE 12.5 MG HALF TABLET
12.5000 mg | ORAL_TABLET | Freq: Every day | ORAL | Status: DC
  Administered 2023-06-19 – 2023-06-22 (×4): 12.5 mg
  Filled 2023-06-19 (×4): qty 1

## 2023-06-19 MED ORDER — ETOMIDATE 2 MG/ML IV SOLN: 20.0000 mg | Freq: Once | INTRAVENOUS | Status: AC

## 2023-06-19 MED ORDER — ACETAMINOPHEN 325 MG PO TABS
650.0000 mg | ORAL_TABLET | Freq: Four times a day (QID) | ORAL | Status: DC | PRN
  Administered 2023-06-19: 650 mg
  Filled 2023-06-19: qty 2

## 2023-06-19 MED ORDER — PERFLUTREN LIPID MICROSPHERE
1.0000 mL | INTRAVENOUS | Status: AC | PRN
  Administered 2023-06-19: 2 mL via INTRAVENOUS

## 2023-06-19 MED ORDER — DOCUSATE SODIUM 50 MG/5ML PO LIQD: 100.0000 mg | Freq: Two times a day (BID) | ORAL | Status: DC | PRN

## 2023-06-19 MED ORDER — ETOMIDATE 2 MG/ML IV SOLN
INTRAVENOUS | Status: AC
  Administered 2023-06-19: 20 mg via INTRAVENOUS
  Filled 2023-06-19: qty 20

## 2023-06-19 MED ORDER — INSULIN ASPART 100 UNIT/ML IJ SOLN
0.0000 [IU] | INTRAMUSCULAR | Status: DC
  Administered 2023-06-20: 2 [IU] via SUBCUTANEOUS
  Administered 2023-06-21 – 2023-06-22 (×7): 3 [IU] via SUBCUTANEOUS
  Administered 2023-06-22: 2 [IU] via SUBCUTANEOUS
  Administered 2023-06-22: 3 [IU] via SUBCUTANEOUS
  Administered 2023-06-22: 2 [IU] via SUBCUTANEOUS
  Administered 2023-06-22 – 2023-06-23 (×2): 5 [IU] via SUBCUTANEOUS
  Administered 2023-06-23 (×2): 3 [IU] via SUBCUTANEOUS
  Administered 2023-06-23: 5 [IU] via SUBCUTANEOUS
  Administered 2023-06-23: 3 [IU] via SUBCUTANEOUS
  Administered 2023-06-24: 8 [IU] via SUBCUTANEOUS
  Administered 2023-06-24: 5 [IU] via SUBCUTANEOUS
  Administered 2023-06-24: 2 [IU] via SUBCUTANEOUS
  Administered 2023-06-24: 3 [IU] via SUBCUTANEOUS
  Administered 2023-06-24: 2 [IU] via SUBCUTANEOUS
  Administered 2023-06-24: 8 [IU] via SUBCUTANEOUS
  Administered 2023-06-25: 3 [IU] via SUBCUTANEOUS
  Administered 2023-06-25: 8 [IU] via SUBCUTANEOUS
  Administered 2023-06-25 (×2): 3 [IU] via SUBCUTANEOUS

## 2023-06-19 MED ORDER — FENTANYL CITRATE (PF) 100 MCG/2ML IJ SOLN: 100.0000 ug | Freq: Once | INTRAMUSCULAR | Status: DC

## 2023-06-19 MED ORDER — DEXTROSE 50 % IV SOLN
INTRAVENOUS | Status: AC
  Administered 2023-06-19: 12.5 g via INTRAVENOUS
  Filled 2023-06-19: qty 50

## 2023-06-19 MED ORDER — HEPARIN SODIUM (PORCINE) 5000 UNIT/ML IJ SOLN
5000.0000 [IU] | Freq: Three times a day (TID) | INTRAMUSCULAR | Status: DC
  Administered 2023-06-19 – 2023-06-21 (×8): 5000 [IU] via SUBCUTANEOUS
  Filled 2023-06-19 (×8): qty 1

## 2023-06-19 MED ORDER — NOREPINEPHRINE 4 MG/250ML-% IV SOLN
2.0000 ug/min | INTRAVENOUS | Status: DC
  Administered 2023-06-19: 8 ug/min via INTRAVENOUS
  Filled 2023-06-19: qty 250

## 2023-06-19 MED ORDER — FENTANYL CITRATE PF 50 MCG/ML IJ SOSY: 100.0000 ug | PREFILLED_SYRINGE | Freq: Once | INTRAMUSCULAR | Status: DC

## 2023-06-19 MED ORDER — SUCCINYLCHOLINE CHLORIDE 200 MG/10ML IV SOSY
PREFILLED_SYRINGE | INTRAVENOUS | Status: AC
  Administered 2023-06-19: 150 mg via INTRAVENOUS
  Filled 2023-06-19: qty 10

## 2023-06-19 MED ORDER — POLYETHYLENE GLYCOL 3350 17 G PO PACK: 17.0000 g | PACK | Freq: Every day | ORAL | Status: DC | PRN

## 2023-06-19 MED ORDER — FENTANYL 2500MCG IN NS 250ML (10MCG/ML) PREMIX INFUSION
0.0000 ug/h | INTRAVENOUS | Status: DC
  Administered 2023-06-19: 86 ug/h via INTRAVENOUS
  Administered 2023-06-19: 25 ug/h via INTRAVENOUS
  Filled 2023-06-19 (×2): qty 250

## 2023-06-19 MED ORDER — CHLORHEXIDINE GLUCONATE CLOTH 2 % EX PADS
6.0000 | MEDICATED_PAD | Freq: Every day | CUTANEOUS | Status: DC
  Administered 2023-06-19 – 2023-06-25 (×7): 6 via TOPICAL

## 2023-06-19 MED ORDER — FENTANYL CITRATE (PF) 100 MCG/2ML IJ SOLN
100.0000 ug | Freq: Once | INTRAMUSCULAR | Status: AC
  Administered 2023-06-19: 100 ug via INTRAVENOUS
  Filled 2023-06-19: qty 2

## 2023-06-19 MED ORDER — NOREPINEPHRINE 4 MG/250ML-% IV SOLN
0.0000 ug/min | INTRAVENOUS | Status: DC
  Administered 2023-06-19: 4 ug/min via INTRAVENOUS
  Filled 2023-06-19: qty 250

## 2023-06-19 MED ORDER — DEXTROSE 50 % IV SOLN: 12.5000 g | Freq: Once | INTRAVENOUS | Status: AC

## 2023-06-19 MED ORDER — SODIUM CHLORIDE 0.9 % IV SOLN
2.0000 g | INTRAVENOUS | Status: AC
  Administered 2023-06-19 – 2023-06-23 (×5): 2 g via INTRAVENOUS
  Filled 2023-06-19 (×5): qty 20

## 2023-06-19 MED ORDER — OFLOXACIN 0.3 % OP SOLN
5.0000 [drp] | Freq: Every day | OPHTHALMIC | Status: DC
  Administered 2023-06-19 – 2023-06-25 (×7): 5 [drp] via OTIC
  Filled 2023-06-19: qty 5

## 2023-06-19 MED ORDER — PANTOPRAZOLE SODIUM 40 MG IV SOLR
40.0000 mg | Freq: Every day | INTRAVENOUS | Status: DC
  Administered 2023-06-19 – 2023-06-24 (×6): 40 mg via INTRAVENOUS
  Filled 2023-06-19 (×5): qty 10

## 2023-06-19 NOTE — Progress Notes (Signed)
72 year old male who was admitted overnight after he presented with generalized weakness and altered mental status, noted to be hypotensive of unclear etiology  Currently requiring vasopressor support Echocardiogram was done this morning which showed EF 30 to 35% with wall motion abnormalities, significant change from prior echocardiogram which was done about a month ago     Physical exam: General: Crtitically ill-appearing male, orally intubated HEENT: [place]/AT, eyes anicteric.  ETT and OGT in place Neuro: Sedated, not following commands.  Eyes are closed.  Pupils 3 mm bilateral reactive to light Chest: Coarse breath sounds, no wheezes or rhonchi Heart: Regular rate and rhythm, no murmurs or gallops Abdomen: Soft, nondistended, bowel sounds present Skin: No rash  Labs and images were reviewed  Assessment and plan: Acute NSTEMI Cardiogenic shock Acute on chronic hypoxic/hypercapnic respiratory failure COPD without exacerbation Acute kidney injury Diabetes type 2 Morbid obesity Urinary tract infection  Patient echocardiogram showed EF 30 to 35% with wall motion abnormalities Will send troponin instead EKG showed no new changes Continue vasopressor support with MAP goal 65 Cardiology consulted Monitor intake and output Continue diuresis He spiked low-grade fever Continue IV antibiotic for UTI Monitor fingerstick goal 140-180  Additional critical care time spent 35 minutes     Cheri Fowler, MD [facility] See Amion for pager If no response to pager, please call [phone] until 7pm After 7pm, Please call E-link [phone]

## 2023-06-19 NOTE — Progress Notes (Signed)
eLink Physician-Brief Progress Note Patient Name: Ricky Johnston DOB: 04/23/1951 MRN: [id number]   Date of Service  06/19/2023  HPI/Events of Note  72 year old male with a history of COPD on 3 L home oxygen at baseline, obstructive sleep apnea, CKD, and remote DVT and was recently admitted for syncope noted to be in a heart failure exacerbation that presented to the emergency department with shock and acute on chronic hypercapnic respiratory failure.  He was intubated in the emergency department and referred to critical care for further management.  On presentation, mild fever with hypotension requiring norepinephrine.  Metabolic panel with acute on chronic renal insufficiency.  CBC with mild anemia.  Urinalysis with glucosuria and leukocytes.  Chest radiograph with vascular congestion, ET tube in appropriate placement.  EKG unremarkable.  eICU Interventions  Pending ground team evaluation  Maintain norepinephrine to maintain MAP greater than 65.  DVT and GI prophylaxis pending.     Intervention Category Evaluation Type: New Patient Evaluation  Ricky Johnston 06/19/2023, 5:09 AM

## 2023-06-19 NOTE — ED Notes (Signed)
Per EDP order, at 0149 20mg  Etomidate administered and flushed with Normal Saline.                          @ 0149 150 mg Succinylcholine administered and flushed with Normal Saline.   RT at bedside assisting with ETT Placement.

## 2023-06-19 NOTE — ED Provider Notes (Signed)
Care assumed from Dr. Rubin Payor, patient with decreased mentation, hypotension.  Blood pressure has come up briefly with IV hydration but then as dropped down.  Lactic acid level has been normal, no evidence of sepsis.  I have reviewed his other labs and he has slight worsening of mild renal insufficiency, mild anemia which is new compared with 1 month ago.  Chest x-ray showed no acute disease and CT of head showed no acute process.  I independently viewed the images, and agree with radiologist interpretation.  Arterial blood gas does show elevated pCO2 of 75 with pH borderline low at 7.33.  He is likely to need intubation.  Because of inability to maintain adequate blood pressure, I have ordered initiation of norepinephrine infusion.  I have discussed the case with Dr. Gaynell Face of critical care service who agrees to accept the patient at [facility], requests patient be intubated before transfer.  Procedure Name: Intubation Date/Time: 06/19/2023 1:55 AM  Performed by: Dione Booze, MDPre-anesthesia Checklist: Patient identified, Patient being monitored, Emergency Drugs available, Timeout performed and Suction available Oxygen Delivery Method: Non-rebreather mask Preoxygenation: Pre-oxygenation with 100% oxygen Induction Type: Rapid sequence Ventilation: Mask ventilation without difficulty Laryngoscope Size: Glidescope and 3 Grade View: Grade I Tube size: 7.5 mm Number of attempts: 1 Airway Equipment and Method: Rigid stylet and Video-laryngoscopy Placement Confirmation: ETT inserted through vocal cords under direct vision, CO2 detector and Breath sounds checked- equal and bilateral Secured at: 24 cm Tube secured with: ETT holder Dental Injury: Teeth and Oropharynx as per pre-operative assessment     I have ordered a postintubation chest x-ray.  CRITICAL CARE Performed by: Dione Booze Total critical care time: 45 minutes Critical care time was exclusive of separately billable  procedures and treating other patients. Critical care was necessary to treat or prevent imminent or life-threatening deterioration. Critical care was time spent personally by me on the following activities: development of treatment plan with patient and/or surrogate as well as nursing, discussions with consultants, evaluation of patient's response to treatment, examination of patient, obtaining history from patient or surrogate, ordering and performing treatments and interventions, ordering and review of laboratory studies, ordering and review of radiographic studies, pulse oximetry and re-evaluation of patient's condition.     Dione Booze, MD 06/19/23 [phone]

## 2023-06-19 NOTE — Consult Note (Addendum)
Advanced Heart Failure Team Consult Note   Primary Physician: [name], MD PCP-Cardiologist:  Olga Millers, MD  Reason for Consultation: Shock, worsening LV function on echo  HPI:    Ricky PATRY Sr. is seen today for evaluation of shock and decline in LV function on echo at the request of DR. Chand with PCCM. 72 y.o. male with history of HFpEF, COPD, chronic respiratory failure on 3 to 4L O2 at baseline, OSA on BiPAP, DM II, hx bladder cancer, solitary kidney.  He was admitted to [facility] 06/8-06/12/24 for possible syncopal episodes felt to be vasovagal versus orthostatic in nature. He was volume overloaded and diuresed. Discharged to SNF.  Returned to ED 06/13 with dyspnea and hypoxia. He was admitted for acute on chronic hypercarbic respiratory failure and required mechanical ventilation. This was felt to be secondary to COPDE vs a/c CHF. Had bradycardia on telemetry. Cardiology consulted. Rhythm appeared to be sinus brady with frequent PACs and atrial bigeminy. No clear indication for pacemaker. Symptoms did not correlate with bradycardia on telemetry. EF preserved on echo.   Patient discharged home from rehab 07/20. He presented to [facility] ED via EMS last night after he became unresponsive. He was hypotensive with BP 74/57. Given IVF then required NE. Lactic acid okay. Had increased work of breathing, after failing BiPAP he was intubated and transferred to [name] for ICU admission. UA suggestive of UTI and started on antibiotics. HS troponin pending. BNP 54.   Echo today: EF 35-40%, akinetic lateral wall, RV okay  Advanced Heart Failure asked to see d/t shock and worsening LV function on echo.   Home Medications Prior to Admission medications   Medication Sig Start Date End Date Taking? Authorizing Provider  albuterol (PROVENTIL HFA;VENTOLIN HFA) 108 (90 Base) MCG/ACT inhaler Inhale 2 puffs into the lungs every 6 (six) hours as needed for wheezing or shortness of breath.   Yes  [provider]  aspirin 81 MG chewable tablet Chew 1 tablet (81 mg total) by mouth 2 (two) times daily after a meal. 02/14/17  Yes Kathryne Hitch, MD  atorvastatin (LIPITOR) 80 MG tablet Take 80 mg by mouth at bedtime.   Yes [provider]  cholecalciferol (VITAMIN D) 1000 UNITS tablet Take 1,000 Units by mouth daily.   Yes [provider]  ferrous sulfate 325 (65 FE) MG tablet Take 325 mg by mouth daily.   Yes [provider]  fluocinonide (LIDEX) 0.05 % external solution Apply 1 Application topically daily as needed (itchy rash on scalp).   Yes [provider]  fluticasone (FLONASE) 50 MCG/ACT nasal spray Place 1-2 sprays into both nostrils in the morning. 07/25/18  Yes [provider]  Fluticasone-Salmeterol (ADVAIR) 250-50 MCG/DOSE AEPB Inhale 1 puff into the lungs daily. 05/18/23  Yes Leroy Sea, MD  gabapentin (NEURONTIN) 300 MG capsule Take 600 mg by mouth 2 (two) times daily.   Yes [provider]  hydrocortisone 2.5 % cream Apply 1 Application topically 2 (two) times daily as needed (rash on face).   Yes [provider]  insulin aspart (NOVOLOG) 100 UNIT/ML FlexPen Before each meal 3 times a day, 140-199 - 2 units, 200-250 - 6 units, 251-299 - 8 units,  300-349 - 10 units,  350 or above 14 units. 05/18/23  Yes Leroy Sea, MD  insulin glargine (LANTUS) 100 UNIT/ML injection Inject 0.4 mLs (40 Units total) into the skin 2 (two) times daily. 05/18/23  Yes Susa Raring  K, MD  ketoconazole (NIZORAL) 2 % shampoo Apply 1 Application topically every Monday, Wednesday, and Friday.   Yes [provider]  tamsulosin (FLOMAX) 0.4 MG CAPS capsule Take 0.4 mg by mouth at bedtime.   Yes [provider]  Tiotropium Bromide Monohydrate (SPIRIVA RESPIMAT) 2.5 MCG/ACT AERS Inhale 2 each into the lungs daily as needed (wheezing, shortness of breath.).   Yes [provider]  OXYGEN Inhale 2-3  L/min into the lungs See admin instructions. 2 L/min continuous. May increase to 3 L/min as needed for shortness of breath.    [provider]    Past Medical History: Past Medical History:  Diagnosis Date   Arthritis    Cancer (HCC)    bladder cancer currently 2016   CHF (congestive heart failure) (HCC)    Complication of anesthesia    hard for him to wake up from Anesthesia from left nephrectomy   COPD (chronic obstructive pulmonary disease) (HCC)    Coronary artery disease    Diabetes mellitus without complication (HCC)    DVT (deep venous thrombosis) (HCC) 01/08/2014, 2010   upper extremity   GERD (gastroesophageal reflux disease)    Hallux limitus 05/18/2015   from notes from [facility]    Headache    migraines   History of kidney stones    Hypercholesterolemia    Hypertension    Impaired hearing    Intervertebral disc syndrome    Kidney stones    Pulmonary fibrosis (HCC)    Renal calculi 01/08/2014   frrom noted from [facility] .in chart   Renal insufficiency 06/19/2023   Restless legs 01/08/2014   Sciatic leg pain    paralysis of sciatic nerve   Sleep apnea    CPAP/BIPAP   Tinnitus     Past Surgical History: Past Surgical History:  Procedure Laterality Date   ABDOMINAL SURGERY     BACK SURGERY     BLADDER REPAIR     EYE SURGERY     HERNIA REPAIR     LEFT HEART CATHETERIZATION WITH CORONARY ANGIOGRAM N/A 01/09/2015   Procedure: LEFT HEART CATHETERIZATION WITH CORONARY ANGIOGRAM;  Surgeon: Robynn Pane, MD;  Location: [facility] CATH LAB;  Service: Cardiovascular;  Laterality: N/A;   NEPHROURETERECTOMY  03/18/2016   with bladder cuff excision- from noted in chart from [facility]   TOTAL HIP ARTHROPLASTY Left 02/10/2017   Procedure: LEFT TOTAL HIP ARTHROPLASTY ANTERIOR APPROACH;  Surgeon: Kathryne Hitch, MD;  Location: [facility] ORS;  Service: Orthopedics;  Laterality: Left;    Family History: History reviewed. No pertinent family history.  Social  History: Social History   Socioeconomic History   Marital status: Married    Spouse name: Not on file   Number of children: Not on file   Years of education: Not on file   Highest education level: Not on file  Occupational History   Not on file  Tobacco Use   Smoking status: Former    Current packs/day: 0.00    Average packs/day: 1 pack/day for 33.0 years (33.0 ttl pk-yrs)    Types: Cigarettes    Start date: 04/07/1967    Quit date: 04/06/2000    Years since quitting: 23.2   Smokeless tobacco: Never   Tobacco comments:    use to chew cigars  Substance and Sexual Activity   Alcohol use: Not Currently    Comment: rarely   Drug use: Never    Comment: patient states does not use illegal drugs  Sexual activity: Not on file  Other Topics Concern   Not on file  Social History Narrative   Not on file   Social Determinants of Health   Financial Resource Strain: Not on file  Food Insecurity: No Food Insecurity (05/06/2023)   Hunger Vital Sign    Worried About Running Out of Food in the Last Year: Never true    Ran Out of Food in the Last Year: Never true  Transportation Needs: No Transportation Needs (05/06/2023)   PRAPARE - Administrator, Civil Service (Medical): No    Lack of Transportation (Non-Medical): No  Physical Activity: Not on file  Stress: Not on file  Social Connections: Not on file    Allergies:  Allergies  Allergen Reactions   Poison Ivy Extract [Poison Ivy Extract] Rash   Poison Oak Extract Rash    Objective:    Vital Signs:   Temp:  [98.2 F (36.8 C)-100.6 F (38.1 C)] 100.6 F (38.1 C) (07/22 1400) Pulse Rate:  [54-90] 71 (07/22 1400) Resp:  [13-26] 21 (07/22 1400) BP: (81-154)/(44-119) 108/62 (07/22 1400) SpO2:  [92 %-99 %] 95 % (07/22 1400) FiO2 (%):  [32 %-40 %] 40 % (07/22 1141) Weight:  [117 kg-119.9 kg] 119.9 kg (07/22 0505) Last BM Date :  (PTA)  Weight change: Filed Weights   06/18/23 2220 06/19/23 0505  Weight: 117 kg  119.9 kg    Intake/Output:   Intake/Output Summary (Last 24 hours) at 06/19/2023 1436 Last data filed at 06/19/2023 1400 Gross per 24 hour  Intake 1058.01 ml  Output 1250 ml  Net -191.99 ml      Physical Exam    General:  Sedated on vent HEENT: + ETT Neck: supple. JVP 7-8. Carotids 2+ bilat; no bruits.  Cor: PMI nondisplaced. Regular rate & rhythm. No rubs, gallops or murmurs. Lungs: clear Abdomen: soft, nontender, nondistended.  Extremities: no cyanosis, clubbing, rash, edema, extremities are warm Neuro: Sedated.  Telemetry   SR 70s, PACs in trigeminy pattern  EKG    ST 104 bpm, PACs  Labs   Basic Metabolic Panel: Recent Labs  Lab 06/18/23 2230 06/19/23 0633  NA 138 139  K 4.2 3.8  CL 97* 103  CO2 32 29  GLUCOSE 217* 109*  BUN 29* 24*  CREATININE 1.64* 1.54*  CALCIUM 8.3* 8.0*  MG  --  2.0  PHOS  --  2.8    Liver Function Tests: Recent Labs  Lab 06/18/23 2230 06/19/23 0633  AST 17 14*  ALT 14 12  ALKPHOS 79 70  BILITOT 0.7 0.6  PROT 7.3 6.2*  ALBUMIN 3.0* 2.5*   No results for input(s): "LIPASE", "AMYLASE" in the last 168 hours. No results for input(s): "AMMONIA" in the last 168 hours.  CBC: Recent Labs  Lab 06/18/23 2230 06/19/23 0633  WBC 5.5 5.4  NEUTROABS 4.5  --   HGB 11.7* 10.9*  HCT 39.6 37.7*  MCV 98.8 96.9  PLT 164 166    Cardiac Enzymes: No results for input(s): "CKTOTAL", "CKMB", "CKMBINDEX", "TROPONINI" in the last 168 hours.  BNP: BNP (last 3 results) Recent Labs    05/17/23 0246 05/18/23 0449 06/18/23 2230  BNP 35.1 34.0 54.0    ProBNP (last 3 results) No results for input(s): "PROBNP" in the last 8760 hours.   CBG: Recent Labs  Lab 06/19/23 0515 06/19/23 0742 06/19/23 1116  GLUCAP 103* 90 72    Coagulation Studies: Recent Labs    06/18/23 2230 06/19/23 1610  LABPROT 15.8* 16.6*  INR 1.2 1.3*     Imaging   ECHOCARDIOGRAM COMPLETE  Result Date: 06/19/2023    ECHOCARDIOGRAM REPORT    Patient Name:   Ricky DEGREGORY Sr. Date of Exam: 06/19/2023 Medical Rec #:  [id number]          Height:       68.0 in Accession #:    [id number]         Weight:       264.3 lb Date of Birth:  12-05-1950          BSA:          2.301 m Patient Age:    72 years           BP:           104/65 mmHg Patient Gender: M                  HR:           65 bpm. Exam Location:  Inpatient Procedure: 2D Echo, Cardiac Doppler, Color Doppler and Intracardiac            Opacification Agent Indications:    Shock  History:        Patient has prior history of Echocardiogram examinations, most                 recent 05/11/2023. CHF, CAD, COPD; Risk Factors:Hypertension and                 Diabetes.  Sonographer:    Darlys Gales Referring Phys: [phone] Clarene Critchley HOFFMAN IMPRESSIONS  1. Left ventricular ejection fraction, by estimation, is 35 to 40%. The left ventricle has moderately decreased function. The left ventricle demonstrates regional wall motion abnormalities (see scoring diagram/findings for description). Left ventricular  diastolic parameters are indeterminate.  2. Right ventricular systolic function was not well visualized. The right ventricular size is not well visualized. Tricuspid regurgitation signal is inadequate for assessing PA pressure.  3. The mitral valve is normal in structure. No evidence of mitral valve regurgitation.  4. The aortic valve was not well visualized. Aortic valve regurgitation is not visualized. No aortic stenosis is present.  5. The inferior vena cava is dilated in size with >50% respiratory variability, suggesting right atrial pressure of 8 mmHg. FINDINGS  Left Ventricle: Left ventricular ejection fraction, by estimation, is 35 to 40%. The left ventricle has moderately decreased function. The left ventricle demonstrates regional wall motion abnormalities. Definity contrast agent was given IV to delineate the left ventricular endocardial borders. The left ventricular internal cavity size was normal in size.  There is no left ventricular hypertrophy. Left ventricular diastolic parameters are indeterminate.  LV Wall Scoring: The entire lateral wall is akinetic. The entire anterior wall, entire septum, entire inferior wall, and apex are normal. Right Ventricle: The right ventricular size is not well visualized. Right vetricular wall thickness was not well visualized. Right ventricular systolic function was not well visualized. Tricuspid regurgitation signal is inadequate for assessing PA pressure. Left Atrium: Left atrial size was normal in size. Right Atrium: Right atrial size was normal in size. Pericardium: There is no evidence of pericardial effusion. Mitral Valve: The mitral valve is normal in structure. No evidence of mitral valve regurgitation. Tricuspid Valve: The tricuspid valve is normal in structure. Tricuspid valve regurgitation is trivial. Aortic Valve: The aortic valve was not well visualized. Aortic valve regurgitation is not visualized. No aortic stenosis is present. Aortic valve  mean gradient measures 7.0 mmHg. Aortic valve peak gradient measures 12.2 mmHg. Aortic valve area, by VTI measures 1.98 cm. Pulmonic Valve: The pulmonic valve was not well visualized. Pulmonic valve regurgitation is not visualized. Aorta: The aortic root and ascending aorta are structurally normal, with no evidence of dilitation. Venous: The inferior vena cava is dilated in size with greater than 50% respiratory variability, suggesting right atrial pressure of 8 mmHg. IAS/Shunts: The interatrial septum was not well visualized.  LEFT VENTRICLE PLAX 2D LVIDd:         5.50 cm   Diastology LVIDs:         4.60 cm   LV e' medial:    7.51 cm/s LV PW:         0.90 cm   LV E/e' medial:  12.2 LV IVS:        0.70 cm   LV e' lateral:   12.60 cm/s LVOT diam:     1.90 cm   LV E/e' lateral: 7.3 LV SV:         67 LV SV Index:   29 LVOT Area:     2.84 cm  RIGHT VENTRICLE             IVC RV S prime:     14.60 cm/s  IVC diam: 2.30 cm TAPSE  (M-mode): 2.4 cm LEFT ATRIUM             Index        RIGHT ATRIUM           Index LA Vol (A2C):   27.4 ml 11.91 ml/m  RA Area:     14.50 cm LA Vol (A4C):   48.9 ml 21.25 ml/m  RA Volume:   38.00 ml  16.52 ml/m LA Biplane Vol: 37.1 ml 16.12 ml/m  AORTIC VALVE AV Area (Vmax):    2.35 cm AV Area (Vmean):   2.29 cm AV Area (VTI):     1.98 cm AV Vmax:           175.00 cm/s AV Vmean:          124.000 cm/s AV VTI:            0.336 m AV Peak Grad:      12.2 mmHg AV Mean Grad:      7.0 mmHg LVOT Vmax:         145.00 cm/s LVOT Vmean:        100.000 cm/s LVOT VTI:          0.235 m LVOT/AV VTI ratio: 0.70  AORTA Ao Root diam: 3.10 cm Ao Asc diam:  3.70 cm MITRAL VALVE MV Area (PHT): 2.41 cm    SHUNTS MV Decel Time: 315 msec    Systemic VTI:  0.24 m MV E velocity: 91.70 cm/s  Systemic Diam: 1.90 cm MV A velocity: 70.20 cm/s MV E/A ratio:  1.31 Epifanio Lesches MD Electronically signed by Epifanio Lesches MD Signature Date/Time: 06/19/2023/11:40:08 AM    Final    DG Chest Portable 1 View  Result Date: 06/19/2023 CLINICAL DATA:  Intubation. EXAM: PORTABLE CHEST 1 VIEW COMPARISON:  Chest radiograph dated 06/18/2023. FINDINGS: Endotracheal tube with tip approximately 4 cm above the carina. Enteric tube extends below the diaphragm with tip in the proximal body of the stomach. Mild cardiomegaly with probable mild vascular congestion. No focal consolidation, pleural effusion, pneumothorax. No acute osseous pathology. IMPRESSION: 1. Endotracheal tube with tip approximately 4 cm above the carina. 2. Mild cardiomegaly  with probable mild vascular congestion. Electronically Signed   By: Elgie Collard M.D.   On: 06/19/2023 02:21   CT Head Wo Contrast  Result Date: 06/19/2023 CLINICAL DATA:  Mental status change, unknown cause EXAM: CT HEAD WITHOUT CONTRAST TECHNIQUE: Contiguous axial images were obtained from the base of the skull through the vertex without intravenous contrast. RADIATION DOSE REDUCTION: This exam  was performed according to the departmental dose-optimization program which includes automated exposure control, adjustment of the mA and/or kV according to patient size and/or use of iterative reconstruction technique. COMPARISON:  None Available. FINDINGS: Brain: There is atrophy and chronic small vessel disease changes. No acute intracranial abnormality. Specifically, no hemorrhage, hydrocephalus, mass lesion, acute infarction, or significant intracranial injury. Vascular: No hyperdense vessel or unexpected calcification. Skull: No acute calvarial abnormality. Sinuses/Orbits: No acute findings Other: None IMPRESSION: Atrophy, chronic microvascular disease. No acute intracranial abnormality. Electronically Signed   By: Charlett Nose M.D.   On: 06/19/2023 01:08   DG Chest Portable 1 View  Result Date: 06/18/2023 CLINICAL DATA:  Code sepsis EXAM: PORTABLE CHEST 1 VIEW COMPARISON:  05/15/2023 FINDINGS: The heart size and mediastinal contours are within normal limits. Both lungs are clear. The visualized skeletal structures are unremarkable. IMPRESSION: No active disease. Electronically Signed   By: Charlett Nose M.D.   On: 06/18/2023 22:43     Medications:     Current Medications:  Chlorhexidine Gluconate Cloth  6 each Topical Daily   fentaNYL (SUBLIMAZE) injection  100 mcg Intravenous Once   heparin  5,000 Units Subcutaneous Q8H   insulin aspart  0-15 Units Subcutaneous Q4H   ofloxacin  5 drop Both EARS Daily   pantoprazole (PROTONIX) IV  40 mg Intravenous QHS    Infusions:  sodium chloride     cefTRIAXone (ROCEPHIN)  IV Stopped (06/19/23 1105)   fentaNYL infusion INTRAVENOUS 112.5 mcg/hr (06/19/23 1400)   norepinephrine (LEVOPHED) Adult infusion Stopped (06/19/23 1426)      Patient Profile   72 y.o. male with history of HFpEF, chronic respiratory failure on home O2, OSA on BiPAP, COPD. Multiple recent hospitalization, now readmitted with shock and acute on chronic respiratory failure.  Echo with newly reduced EF to 35-40%.  Assessment/Plan   Shock -Etiology uncertain. Improved with IVF and NE, now off vasopressor support. -Lactic acid okay -Now febrile, T max today 100.7F. No leukocytosis -UA suggestive of UTI, now on ceftriaxone. Culture pending -BC X 2 pending. -? Cardiogenic with newly reduced EF and new WMA on echo (lateral wall akinetic). HS troponin pending. Eventually consider LHC to rule out obstructive CAD.  2. HFpEF with newly reduced EF -Had mild CAD on cath in 2016 -EF 55-60% on echo 06/24 -Echo 07/24: EF 35-40%, lateral wall akinetic, RV not well visualized -Recently wore 14 day Zio: SR  w/ frequent PACs (15%), 1% PVC burden. No arrhythmias.  -Does not appear low-output -Continue diuresis. Received 40 mg lasix IV this am -Start spiro 12.5 mg daily -No SGLT2i for now with UTI -[facility] or [facility] tomorrow if BP stable  3. Acute on chronic respiratory failure with hypoxia and hypercarbia, on home O2 COPD Restrictive lung disease OSA on BiPAP -Vent management per CCM -Follows with pulmonary at [facility]  4. UTI -On empiric ceftriaxone -Culture pending  5. CKD III -Recently Cr has been variable, unclear baseline but suspect 1.2-1.4 -Scr 1.6>1.5 this admit -Follow  6. DM II -Insulin dependent -A1c 8.6 06/24 -Per primary -No SGLT2i with UTI  Length of Stay: 0  FINCH, LINDSAY N, PA-C  06/19/2023, 2:36 PM  Advanced Heart Failure Team Pager [phone] (M-F; 7a - 5p)  Please contact [facility] for night-coverage after hours (4p -7a ) and weekends on [email or web address]   Agree with abve.   72 y/o man with chronic respiratory failure, DM2, HfPEF. OSA.   Several recent hospitalizations for resp failure. Admitted with recurrent hypercarbic resp failure. Intubated. Developed hypotension. UA + UTI. Cx pending PCT 0.46  Echo today very difficult windows. EF read as 35-40% with alteral wall AK.  Hs trop negative  Now on NE  ECG ST 104 + PACs  General:   Intubated. Chronically ill appearing  HEENT: +RTT Neck: supple. no JVD. Carotids 2+ bilat; no bruits. No lymphadenopathy or thryomegaly appreciated. Cor: PMI nondisplaced. Regular rate & rhythm. No rubs, gallops or murmurs. Lungs: clear Abdomen: obese  soft, nontender, nondistended. No hepatosplenomegaly. No bruits or masses. Good bowel sounds. Extremities: no cyanosis, clubbing, rash, edema Neuro: intubated sedated   I have reviewed echo and EF seems more like 45-50%. I am not convinced the alteral wall is AK. May be some anterior HK but very hard to see.   No evidence of ACS by troponins.   My suspicion is that his decompensation is not primarily cardiac related and more related to COPD and possible urosepsis.   Will trend co-ox and CVP. Can consider TEE to get a better look at LV.   D/w CCM  CRITICAL CARE Performed by: Arvilla Meres  Total critical care time: 55 minutes  Critical care time was exclusive of separately billable procedures and treating other patients.  Critical care was necessary to treat or prevent imminent or life-threatening deterioration.  Critical care was time spent personally by me (independent of midlevel providers or residents) on the following activities: development of treatment plan with patient and/or surrogate as well as nursing, discussions with consultants, evaluation of patient's response to treatment, examination of patient, obtaining history from patient or surrogate, ordering and performing treatments and interventions, ordering and review of laboratory studies, ordering and review of radiographic studies, pulse oximetry and re-evaluation of patient's condition.  Arvilla Meres, MD  6:30 PM

## 2023-06-19 NOTE — Progress Notes (Signed)
eLink Physician-Brief Progress Note Patient Name: Ricky Johnston DOB: 1951/11/17 MRN: [id number]   Date of Service  06/19/2023  HPI/Events of Note  72 year old with a history of COPD on 3 L, OSA, and syncope in the setting of heart failure presented with shock.  The team wants to minimize sedation.  The patient was pulling at his ET tube  eICU Interventions  Bilateral wrist restraints for patient's safety     Intervention Category Minor Interventions: Agitation / anxiety - evaluation and management  Darionna Banke 06/19/2023, 8:02 PM

## 2023-06-19 NOTE — H&P (Addendum)
NAME:  Ricky Treto., MRN:  [id number], DOB:  October 08, 1951, LOS: 0 ADMISSION DATE:  06/18/2023, CONSULTATION DATE:  7/22 REFERRING MD:  Dr Preston Fleeting EDP, CHIEF COMPLAINT:  hypotension   History of Present Illness:  Patient is encephalopathic and/or intubated; therefore, history has been obtained from chart review.  72 year old male with past medical history as below, which is significant for hypertension, hyperlipidemia, CHF, CAD, COPD, chronic respiratory failure on oxygen, sleep apnea on CPAP, and diabetes.  He was recently admitted to [facility] twice.  The first on June 8 for syncope which was ultimately felt to be vasovagal in nature.  He was again admitted on 6/13 in respiratory distress and was treated for COPD and CHF exacerbations.  He was discharged to SNF with a new oxygen requirement.  He was discharged from SNF on 7/20 and on 7/21 he again presented to [facility] emergency department with weakness and altered sensorium.  Upon arrival to the emergency department he was noted to be hypotensive which initially responded to IV fluids, however, he ultimately required norepinephrine infusion.  Lactic acid was normal, however.  He did have increased work of breathing for which she was started on BiPAP.  Chest x-ray was unremarkable.  ABG showed very mild respiratory acidosis and the patient was intubated.  He was transferred to [facility] for ICU admission.  Pertinent  Medical History   has a past medical history of Arthritis, Cancer (HCC), CHF (congestive heart failure) (HCC), Complication of anesthesia, COPD (chronic obstructive pulmonary disease) (HCC), Coronary artery disease, Diabetes mellitus without complication (HCC), DVT (deep venous thrombosis) (HCC) (01/08/2014, 2010), GERD (gastroesophageal reflux disease), Hallux limitus (05/18/2015), Headache, History of kidney stones, Hypercholesterolemia, Hypertension, Impaired hearing, Intervertebral disc syndrome, Kidney stones,  Pulmonary fibrosis (HCC), Renal calculi (01/08/2014), Restless legs (01/08/2014), Sciatic leg pain, Sleep apnea, and Tinnitus.   Significant Hospital Events: Including procedures, antibiotic start and stop dates in addition to other pertinent events   6/8 admit for syncope felt to be vasovagal 6/18 admit for CHF exacerbation, dc to SNF.  7/21 admit for hypotension, respiratory failure.   Interim History / Subjective:    Objective   Blood pressure (!) 154/119, pulse 68, temperature (!) 100.4 F (38 C), temperature source Bladder, resp. rate 17, height 5\' 8"  (1.727 m), weight 119.9 kg, SpO2 97%.    Vent Mode: PRVC FiO2 (%):  [32 %-40 %] 40 % Set Rate:  [20 bmp] 20 bmp Vt Set:  [540 mL] 540 mL PEEP:  [5 cmH20] 5 cmH20 Plateau Pressure:  [20 cmH20] 20 cmH20   Intake/Output Summary (Last 24 hours) at 06/19/2023 0539 Last data filed at 06/19/2023 0345 Gross per 24 hour  Intake 500 ml  Output 350 ml  Net 150 ml   Filed Weights   06/18/23 2220 06/19/23 0505  Weight: 117 kg 119.9 kg    Examination: General: overweight elderly appearing gentleman on vent HENT: Stella/AT, PERRL, no JVD Lungs: Clear bilateral breath sounds Cardiovascular: Irregular. Rate controlled. Frequent PAC on monitor. No significant edema.  Abdomen: Protuberant. Soft, Non-distended Extremities: No acute deformity or edema Neuro: Sedated. RASS -3 GU: Foley  Resolved Hospital Problem list     Assessment & Plan:   Shock: etiology unclear. Not much evidence for sepsis or heart failure on labs, imaging, or exam. Lactic acid unremarkable. Did briefly improve with volume, so maybe as simple as hypovolemia, but with his cardiac history I am hesitant to give any more fluid (has had 2.5L) -  Admit to ICU - NE for MAP goal 65, weaning down currently. On down from on arrival. Still room to wean.  - Repeat echocardiogram _ Check cortisol, TSH - Check PCT - Defer antibiotics for now. Blood and urine cultures  pending  Acute on chronic mixed respiratory failure: on 3-4 L at baseline OSA on CPAP - Full vent support - ABG and CXR reviewed - Fentanyl infusion for RASS goal -1 to -2.  - SAT, SBT in AM - VAP bundle.  - Resume QHS CPAP when extubated  COPD without acute exacerbation - Duonebs - Budesonide  AKI - Trend BMP and UOP  Otitis externa  - ofloxacin drops to both ears daily  DM - CBG monitoring and SSI  Best Practice (right click and "Reselect all SmartList Selections" daily)   Diet/type: NPO DVT prophylaxis: prophylactic heparin  GI prophylaxis: PPI Lines: N/A Foley:  Yes, and it is still needed Code Status:  full code Last date of multidisciplinary goals of care discussion [ ]   Labs   CBC: Recent Labs  Lab 06/18/23 2230  WBC 5.5  NEUTROABS 4.5  HGB 11.7*  HCT 39.6  MCV 98.8  PLT 164    Basic Metabolic Panel: Recent Labs  Lab 06/18/23 2230  NA 138  K 4.2  CL 97*  CO2 32  GLUCOSE 217*  BUN 29*  CREATININE 1.64*  CALCIUM 8.3*   GFR: Estimated Creatinine Clearance: 51.3 mL/min (A) (by C-G formula based on SCr of 1.64 mg/dL (H)). Recent Labs  Lab 06/18/23 2230 06/19/23 0017  WBC 5.5  --   LATICACIDVEN 1.4 1.6    Liver Function Tests: Recent Labs  Lab 06/18/23 2230  AST 17  ALT 14  ALKPHOS 79  BILITOT 0.7  PROT 7.3  ALBUMIN 3.0*   No results for input(s): "LIPASE", "AMYLASE" in the last 168 hours. No results for input(s): "AMMONIA" in the last 168 hours.  ABG    Component Value Date/Time   PHART 7.38 06/19/2023 0238   PCO2ART 62 (H) 06/19/2023 0238   PO2ART 104 06/19/2023 0238   HCO3 36.7 (H) 06/19/2023 0238   TCO2 33 (H) 05/11/2023 0745   ACIDBASEDEF 0.8 02/10/2017 1740   O2SAT 98.9 06/19/2023 0238     Coagulation Profile: Recent Labs  Lab 06/18/23 2230  INR 1.2    Cardiac Enzymes: No results for input(s): "CKTOTAL", "CKMB", "CKMBINDEX", "TROPONINI" in the last 168 hours.  HbA1C: Hgb A1c MFr Bld  Date/Time Value Ref  Range Status  05/11/2023 08:20 AM 8.6 (H) 4.8 - 5.6 % Final    Comment:    (NOTE) Pre diabetes:          5.7%-6.4%  Diabetes:              >6.4%  Glycemic control for   <7.0% adults with diabetes   05/06/2023 06:49 PM 8.6 (H) 4.8 - 5.6 % Final    Comment:    (NOTE) Pre diabetes:          5.7%-6.4%  Diabetes:              >6.4%  Glycemic control for   <7.0% adults with diabetes     CBG: Recent Labs  Lab 06/19/23 0515  GLUCAP 103*    Review of Systems:   Patient is encephalopathic and/or intubated; therefore, history has been obtained from chart review.   Past Medical History:  He,  has a past medical history of Arthritis, Cancer (HCC), CHF (congestive heart  failure) (HCC), Complication of anesthesia, COPD (chronic obstructive pulmonary disease) (HCC), Coronary artery disease, Diabetes mellitus without complication (HCC), DVT (deep venous thrombosis) (HCC) (01/08/2014, 2010), GERD (gastroesophageal reflux disease), Hallux limitus (05/18/2015), Headache, History of kidney stones, Hypercholesterolemia, Hypertension, Impaired hearing, Intervertebral disc syndrome, Kidney stones, Pulmonary fibrosis (HCC), Renal calculi (01/08/2014), Restless legs (01/08/2014), Sciatic leg pain, Sleep apnea, and Tinnitus.   Surgical History:   Past Surgical History:  Procedure Laterality Date   ABDOMINAL SURGERY     BACK SURGERY     BLADDER REPAIR     EYE SURGERY     HERNIA REPAIR     LEFT HEART CATHETERIZATION WITH CORONARY ANGIOGRAM N/A 01/09/2015   Procedure: LEFT HEART CATHETERIZATION WITH CORONARY ANGIOGRAM;  Surgeon: Robynn Pane, MD;  Location: [facility] CATH LAB;  Service: Cardiovascular;  Laterality: N/A;   NEPHROURETERECTOMY  03/18/2016   with bladder cuff excision- from noted in chart from [facility]   TOTAL HIP ARTHROPLASTY Left 02/10/2017   Procedure: LEFT TOTAL HIP ARTHROPLASTY ANTERIOR APPROACH;  Surgeon: Kathryne Hitch, MD;  Location: [facility] ORS;  Service: Orthopedics;   Laterality: Left;     Social History:   reports that he quit smoking about 23 years ago. His smoking use included cigarettes. He started smoking about 56 years ago. He has a 33 pack-year smoking history. He has never used smokeless tobacco. He reports that he does not currently use alcohol. He reports that he does not use drugs.   Family History:  His family history is not on file.   Allergies Allergies  Allergen Reactions   Poison Ivy Extract [Poison Ivy Extract] Rash   Poison Oak Extract Rash     Home Medications  Prior to Admission medications   Medication Sig Start Date End Date Taking? Authorizing Provider  albuterol (PROVENTIL HFA;VENTOLIN HFA) 108 (90 Base) MCG/ACT inhaler Inhale 2 puffs into the lungs every 6 (six) hours as needed for wheezing or shortness of breath.    [provider]  aspirin 81 MG chewable tablet Chew 1 tablet (81 mg total) by mouth 2 (two) times daily after a meal. 02/14/17   Kathryne Hitch, MD  atorvastatin (LIPITOR) 80 MG tablet Take 80 mg by mouth at bedtime.    [provider]  cholecalciferol (VITAMIN D) 1000 UNITS tablet Take 1,000 Units by mouth daily.    [provider]  ferrous sulfate 325 (65 FE) MG tablet Take 325 mg by mouth daily.    [provider]  fluocinonide (LIDEX) 0.05 % external solution Apply 1 Application topically daily as needed (itchy rash on scalp).    [provider]  fluticasone (FLONASE) 50 MCG/ACT nasal spray Place 1-2 sprays into both nostrils in the morning. 07/25/18   [provider]  Fluticasone-Salmeterol (ADVAIR) 250-50 MCG/DOSE AEPB Inhale 1 puff into the lungs daily. 05/18/23   Leroy Sea, MD  gabapentin (NEURONTIN) 300 MG capsule Take 600 mg by mouth 3 (three) times daily.    [provider]  hydrocortisone 2.5 % cream Apply 1 Application topically 2 (two) times daily as needed (rash on face).    [provider]  insulin aspart  (NOVOLOG) 100 UNIT/ML FlexPen Before each meal 3 times a day, 140-199 - 2 units, 200-250 - 6 units, 251-299 - 8 units,  300-349 - 10 units,  350 or above 14 units. 05/18/23   Leroy Sea, MD  insulin glargine (LANTUS) 100 UNIT/ML injection Inject 0.4 mLs (40 Units total) into the skin  2 (two) times daily. 05/18/23   Leroy Sea, MD  ketoconazole (NIZORAL) 2 % shampoo Apply 1 Application topically every Monday, Wednesday, and Friday.    [provider]  OXYGEN Inhale 2-3 L/min into the lungs See admin instructions. 2 L/min continuous. May increase to 3 L/min as needed for shortness of breath.    [provider]  tamsulosin (FLOMAX) 0.4 MG CAPS capsule Take 0.4 mg by mouth at bedtime.    [provider]  Tiotropium Bromide Monohydrate (SPIRIVA RESPIMAT) 2.5 MCG/ACT AERS Inhale 2 each into the lungs daily as needed (wheezing, shortness of breath.).    [provider]     Critical care time: 43 minutes     Joneen Roach, AGACNP-BC [facility]  See Amion for personal pager PCCM on call pager [phone] until 7pm. Please call Elink 7p-7a. [phone]  06/19/2023 6:12 AM

## 2023-06-19 NOTE — ED Notes (Signed)
Increased BiPAP to 20/8 f 18 . After blood gas.

## 2023-06-19 NOTE — ED Notes (Signed)
Pts significant other took Pts belongings home with her prior to Pts transfer to [facility] ICU 2H.

## 2023-06-20 DIAGNOSIS — R6521 Severe sepsis with septic shock: Secondary | ICD-10-CM | POA: Diagnosis not present

## 2023-06-20 DIAGNOSIS — A419 Sepsis, unspecified organism: Secondary | ICD-10-CM

## 2023-06-20 DIAGNOSIS — J9622 Acute and chronic respiratory failure with hypercapnia: Secondary | ICD-10-CM | POA: Diagnosis not present

## 2023-06-20 DIAGNOSIS — J9621 Acute and chronic respiratory failure with hypoxia: Secondary | ICD-10-CM | POA: Diagnosis not present

## 2023-06-20 LAB — PHOSPHORUS
Phosphorus: 3.4 mg/dL (ref 2.5–4.6)
Phosphorus: 4.4 mg/dL (ref 2.5–4.6)

## 2023-06-20 LAB — GLUCOSE, CAPILLARY
Glucose-Capillary: 102 mg/dL — ABNORMAL HIGH (ref 70–99)
Glucose-Capillary: 113 mg/dL — ABNORMAL HIGH (ref 70–99)
Glucose-Capillary: 141 mg/dL — ABNORMAL HIGH (ref 70–99)
Glucose-Capillary: 75 mg/dL (ref 70–99)
Glucose-Capillary: 93 mg/dL (ref 70–99)

## 2023-06-20 LAB — BASIC METABOLIC PANEL
Anion gap: 10 (ref 5–15)
BUN: 25 mg/dL — ABNORMAL HIGH (ref 8–23)
CO2: 31 mmol/L (ref 22–32)
Calcium: 8.1 mg/dL — ABNORMAL LOW (ref 8.9–10.3)
Chloride: 99 mmol/L (ref 98–111)
Creatinine, Ser: 1.44 mg/dL — ABNORMAL HIGH (ref 0.61–1.24)
GFR, Estimated: 52 mL/min — ABNORMAL LOW (ref >60)
Glucose, Bld: 82 mg/dL (ref 70–99)
Potassium: 4.4 mmol/L (ref 3.5–5.1)
Sodium: 140 mmol/L (ref 135–145)

## 2023-06-20 LAB — CBC
HCT: 35.4 % — ABNORMAL LOW (ref 39.0–52.0)
Hemoglobin: 10.8 g/dL — ABNORMAL LOW (ref 13.0–17.0)
MCH: 30.1 pg (ref 26.0–34.0)
MCHC: 30.5 g/dL (ref 30.0–36.0)
MCV: 98.6 fL (ref 80.0–100.0)
Platelets: 154 10*3/uL (ref 150–400)
RBC: 3.59 MIL/uL — ABNORMAL LOW (ref 4.22–5.81)
RDW: 15.7 % — ABNORMAL HIGH (ref 11.5–15.5)
WBC: 6 10*3/uL (ref 4.0–10.5)
nRBC: 0 % (ref 0.0–0.2)

## 2023-06-20 LAB — CULTURE, BLOOD (ROUTINE X 2)
Special Requests: ADEQUATE
Special Requests: ADEQUATE

## 2023-06-20 LAB — MAGNESIUM
Magnesium: 2 mg/dL (ref 1.7–2.4)
Magnesium: 1.9 mg/dL (ref 1.7–2.4)

## 2023-06-20 MED ORDER — LOSARTAN POTASSIUM 25 MG PO TABS
12.5000 mg | ORAL_TABLET | Freq: Every day | ORAL | Status: DC
  Administered 2023-06-20 – 2023-06-21 (×2): 12.5 mg
  Filled 2023-06-20 (×2): qty 1

## 2023-06-20 MED ORDER — PROSOURCE TF20 ENFIT COMPATIBL EN LIQD
60.0000 mL | Freq: Every day | ENTERAL | Status: DC
  Filled 2023-06-20: qty 60

## 2023-06-20 MED ORDER — FUROSEMIDE 10 MG/ML IJ SOLN
80.0000 mg | Freq: Once | INTRAMUSCULAR | Status: AC
  Administered 2023-06-20: 80 mg via INTRAVENOUS
  Filled 2023-06-20: qty 8

## 2023-06-20 MED ORDER — VITAL HIGH PROTEIN PO LIQD: 1000.0000 mL | ORAL | Status: DC

## 2023-06-20 MED ORDER — FENTANYL 2500MCG IN NS 250ML (10MCG/ML) PREMIX INFUSION
25.0000 ug/h | INTRAVENOUS | Status: DC
  Administered 2023-06-21: 50 ug/h via INTRAVENOUS
  Filled 2023-06-20: qty 250

## 2023-06-20 MED ORDER — POLYETHYLENE GLYCOL 3350 17 G PO PACK
17.0000 g | PACK | Freq: Every day | ORAL | Status: DC
  Administered 2023-06-21 – 2023-06-22 (×2): 17 g
  Filled 2023-06-20 (×2): qty 1

## 2023-06-20 MED ORDER — FENTANYL CITRATE PF 50 MCG/ML IJ SOSY: 25.0000 ug | PREFILLED_SYRINGE | Freq: Once | INTRAMUSCULAR | Status: DC

## 2023-06-20 MED ORDER — DOCUSATE SODIUM 50 MG/5ML PO LIQD
100.0000 mg | Freq: Two times a day (BID) | ORAL | Status: DC
  Administered 2023-06-20 – 2023-06-22 (×4): 100 mg
  Filled 2023-06-20 (×4): qty 10

## 2023-06-20 MED ORDER — FENTANYL BOLUS VIA INFUSION
25.0000 ug | INTRAVENOUS | Status: DC | PRN
  Administered 2023-06-20: 50 ug via INTRAVENOUS
  Administered 2023-06-21: 100 ug via INTRAVENOUS
  Administered 2023-06-21: 50 ug via INTRAVENOUS

## 2023-06-20 MED ORDER — PROSOURCE TF20 ENFIT COMPATIBL EN LIQD
60.0000 mL | Freq: Two times a day (BID) | ENTERAL | Status: DC
  Administered 2023-06-20 – 2023-06-22 (×4): 60 mL
  Filled 2023-06-20 (×4): qty 60

## 2023-06-20 MED ORDER — VITAL 1.5 CAL PO LIQD
1000.0000 mL | ORAL | Status: DC
  Administered 2023-06-20 – 2023-06-22 (×2): 1000 mL

## 2023-06-20 NOTE — TOC Progression Note (Signed)
Transition of Care (TOC) - Progression Note    Patient Details  Name: Ricky MILLIRON Sr. MRN: [id number] Date of Birth: [birth year]  Transition of Care [facility]) CM/SW Contact  Reva Bores, LCSWA Phone Number: 06/20/2023, 1:58 PM  Clinical Narrative:  CSW spoke with [place] at the [facility] Centralized [facility] to inform them that the pt was currently in the hospital. TOC will continue to follow.      Expected Discharge Plan: Skilled Nursing Facility Barriers to Discharge: Continued Medical Work up  Expected Discharge Plan and Services In-house Referral: Clinical Social Work Discharge Planning Services: CM Consult Post Acute Care Choice: Home Health Living arrangements for the past 2 months: Single Family Home                             HH Agency: [facility]         Social Determinants of Health (SDOH) Interventions SDOH Screenings   Food Insecurity: No Food Insecurity (05/06/2023)  Housing: Low Risk  (05/06/2023)  Transportation Needs: No Transportation Needs (05/06/2023)  Utilities: Patient Declined (05/07/2023)  Depression (PHQ2-9): Low Risk  (11/08/2021)  Tobacco Use: Medium Risk (06/18/2023)    Readmission Risk Interventions     No data to display

## 2023-06-20 NOTE — Progress Notes (Signed)
Initial Nutrition Assessment  DOCUMENTATION CODES:   Obesity unspecified  INTERVENTION:   Tube Feeding via OG:  Vital 1.5 at 50 ml/hr Begin TF at rate of 20 ml/hr; titrate by 10 mL q 6 hours until goal rate of 50 ml/hr Pro-Source TF20 60 mL BID  NUTRITION DIAGNOSIS:   Inadequate oral intake related to acute illness as evidenced by NPO status.  GOAL:   Patient will meet greater than or equal to 90% of their needs  MONITOR:   Vent status, Labs, Weight trends, TF tolerance, I & O's  REASON FOR ASSESSMENT:   Consult, Ventilator Enteral/tube feeding initiation and management  ASSESSMENT:   72 yo male admitted with acute on chronic respiratory failure requiring intubation, shock, HFpEF with newly reduced EF. PMH includes HFpEF, COPD, chronic respiratory failure on oxygen at baseline, DM, hx of bladder cancer, solitary kidney, CKD 3.  7/21 Admitted  Pt remains on vent support, off vasopressors Noted plan for TEE tomorrow, noted possible extubation tomorrow post procedure  Noted this is the 3rd hospital admission since beginning of June. Unable to obtain diet and weight history from patient at this time.   Noted hypoglycemic episode in the last 24 hours. CBGs should improve with initiation of TF  OG tube enter stomach per chest xray report  Current wt 119.9 kg; noted wt of 116.7 kg on 7/18  Labs: Creatinine 1.44, BUN 25, CBGs 58-102 Meds: ss novolog, colace, miralax   Diet Order:   Diet Order             Diet NPO time specified  Diet effective now                   EDUCATION NEEDS:   Not appropriate for education at this time  Skin:  Skin Assessment: Reviewed RN Assessment  Last BM:  PTA  Height:   Ht Readings from Last 1 Encounters:  06/19/23 5\' 8"  (1.727 m)    Weight:   Wt Readings from Last 1 Encounters:  06/19/23 119.9 kg    Ideal Body Weight:     BMI:  Body mass index is 40.19 kg/m.  Estimated Nutritional Needs:   Kcal:   1750-1950 kcals  Protein:  105-125 g  Fluid:  1.8 L   Romelle Starcher MS, RDN, LDN, CNSC Registered Dietitian 3 Clinical Nutrition RD Pager and On-Call Pager Number Located in [place]

## 2023-06-20 NOTE — TOC Initial Note (Addendum)
Transition of Care (TOC) - Initial/Assessment Note    Patient Details  Name: Ricky BERENT Sr. MRN: [id number] Date of Birth: [birth year]  Transition of Care [facility]) CM/SW Contact:    Elliot Cousin, RN Phone Number: [phone] 06/20/2023, 1:02 PM  Clinical Narrative: HF TOC CM/CSW spoke to wife at bedside. Pt has needed DME. States pt was at [facility] and was dc home with [facility]. Explained will need PT/OT evaluation. Pt may need SNF rehab. Will continue to follow for dc needs.       Contacted [facility] notification line, ref # [id number]            Expected Discharge Plan: Skilled Nursing Facility Barriers to Discharge: Continued Medical Work up   Patient Goals and CMS Choice Patient states their goals for this hospitalization and ongoing recovery are:: wife wants pt to recover CMS Medicare.gov Compare Post Acute Care list provided to:: Patient Represenative (must comment) [place])        Expected Discharge Plan and Services In-house Referral: Clinical Social Work Discharge Planning Services: CM Consult Post Acute Care Choice: Home Health Living arrangements for the past 2 months: Single Family Home                             HH Agency: [facility]        Prior Living Arrangements/Services Living arrangements for the past 2 months: Single Family Home Lives with:: Spouse Patient language and need for interpreter reviewed:: Yes        Need for Family Participation in Patient Care: Yes (Comment) Care giver support system in place?: Yes (comment) Current home services: DME, Home RN Criminal Activity/Legal Involvement Pertinent to Current Situation/Hospitalization: No - Comment as needed  Activities of Daily Living      Permission Sought/Granted Permission sought to share information with : Case Manager, Family Supports Permission granted to share information with : Yes, Verbal Permission Granted  Share Information with NAME:  Demosthenes Virnig  Permission granted to share info w AGENCY: SNF, Home Health  Permission granted to share info w Relationship: wife  Permission granted to share info w Contact Information: [phone]  Emotional Assessment Appearance:: Appears stated age Attitude/Demeanor/Rapport: Intubated (Following Commands or Not Following Commands)          Admission diagnosis:  Encephalopathy [G93.40] Renal insufficiency [N28.9] Acute and chronic respiratory failure with hypercapnia (HCC) [J96.22] Normochromic normocytic anemia [D64.9] Hypotension, unspecified hypotension type [I95.9] Acute on chronic respiratory failure with hypoxia and hypercapnia (HCC) [J96.21, J96.22] Shock (HCC) [R57.9] Patient Active Problem List   Diagnosis Date Noted   Shock (HCC) 06/19/2023   Renal insufficiency 06/19/2023   Acute and chronic respiratory failure with hypercapnia (HCC) 06/19/2023   Bradycardia 05/13/2023   Acute on chronic respiratory failure with hypoxia and hypercapnia (HCC) 05/11/2023   Acute hypoxic respiratory failure (HCC) 05/11/2023   Class 2 obesity 05/07/2023   CKD stage 3a, GFR 45-59 ml/min (HCC) 05/07/2023   Syncope 05/06/2023   Impaired functional mobility, balance, gait, and endurance 09/27/2018   Unilateral primary osteoarthritis, left hip 02/10/2017   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   H/O total hip arthroplasty, left 02/10/2017   History of total hip arthroplasty 02/10/2017   Respiratory failure (HCC)    OSA (obstructive sleep apnea)    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   HTN (hypertension) 10/13/2016   Anemia 03/02/2016   BPH (benign prostatic hyperplasia) 03/02/2016  Type 2 diabetes mellitus with hyperlipidemia (HCC) 03/02/2016   Migraines 03/02/2016   OAB (overactive bladder) 03/02/2016   CAD (coronary artery disease) 02/09/2015   Morbid obesity with BMI of 40.0-44.9, adult (HCC) 01/29/2015   Bladder cancer (HCC) 01/29/2015   Pain in the chest     Chronic systolic heart failure (HCC)    Esophageal reflux    Chest pain 01/08/2015   Insulin dependent diabetes mellitus 01/08/2015   COPD (chronic obstructive pulmonary disease) (HCC) 01/08/2015   Hyperlipidemia associated with type 2 diabetes mellitus (HCC) 01/08/2015   PCP:  [name], MD Pharmacy:   [facility] [place], [place] - [address] [place] [address] Phone: [phone] Fax: [phone]  [facility] - [place], [place] - [address] [place] [address] Phone: [phone] Fax: [phone]     Social Determinants of Health (SDOH) Social History: SDOH Screenings   Food Insecurity: No Food Insecurity (05/06/2023)  Housing: Low Risk  (05/06/2023)  Transportation Needs: No Transportation Needs (05/06/2023)  Utilities: Patient Declined (05/07/2023)  Depression (PHQ2-9): Low Risk  (11/08/2021)  Tobacco Use: Medium Risk (06/18/2023)   SDOH Interventions:     Readmission Risk Interventions     No data to display

## 2023-06-20 NOTE — Progress Notes (Signed)
NAME:  Ricky Marling., MRN:  [id number], DOB:  1951/01/13, LOS: 1 ADMISSION DATE:  06/18/2023, CONSULTATION DATE:  7/22 REFERRING MD:  Dr Preston Fleeting EDP, CHIEF COMPLAINT:  hypotension   History of Present Illness:  Patient is encephalopathic and/or intubated; therefore, history has been obtained from chart review.  72 year old male with past medical history as below, which is significant for hypertension, hyperlipidemia, CHF, CAD, COPD, chronic respiratory failure on oxygen, sleep apnea on CPAP, and diabetes.  He was recently admitted to [facility] twice.  The first on June 8 for syncope which was ultimately felt to be vasovagal in nature.  He was again admitted on 6/13 in respiratory distress and was treated for COPD and CHF exacerbations.  He was discharged to SNF with a new oxygen requirement.  He was discharged from SNF on 7/20 and on 7/21 he again presented to [facility] emergency department with weakness and altered sensorium.  Upon arrival to the emergency department he was noted to be hypotensive which initially responded to IV fluids, however, he ultimately required norepinephrine infusion.  Lactic acid was normal, however.  He did have increased work of breathing for which she was started on BiPAP.  Chest x-ray was unremarkable.  ABG showed very mild respiratory acidosis and the patient was intubated.  He was transferred to [facility] for ICU admission.  Pertinent  Medical History   has a past medical history of Arthritis, Cancer (HCC), CHF (congestive heart failure) (HCC), Complication of anesthesia, COPD (chronic obstructive pulmonary disease) (HCC), Coronary artery disease, Diabetes mellitus without complication (HCC), DVT (deep venous thrombosis) (HCC) (01/08/2014, 2010), GERD (gastroesophageal reflux disease), Hallux limitus (05/18/2015), Headache, History of kidney stones, Hypercholesterolemia, Hypertension, Impaired hearing, Intervertebral disc syndrome, Kidney stones,  Pulmonary fibrosis (HCC), Renal calculi (01/08/2014), Renal insufficiency (06/19/2023), Restless legs (01/08/2014), Sciatic leg pain, Sleep apnea, and Tinnitus.   Significant Hospital Events: Including procedures, antibiotic start and stop dates in addition to other pertinent events   6/8 admit for syncope felt to be vasovagal 6/18 admit for CHF exacerbation, dc to SNF.  7/21 admit for hypotension, respiratory failure.   Interim History / Subjective:  Patient became afebrile Came off of vasopressor support Had 25 beats of nonsustained V. tach Awake, following commands  Objective   Blood pressure (!) 102/52, pulse (!) 58, temperature 98.4 F (36.9 C), resp. rate 15, height 5\' 8"  (1.727 m), weight 119.9 kg, SpO2 94%.    Vent Mode: CPAP;PSV FiO2 (%):  [40 %] 40 % Set Rate:  [20 bmp] 20 bmp Vt Set:  [540 mL] 540 mL PEEP:  [5 cmH20] 5 cmH20 Pressure Support:  [10 cmH20] 10 cmH20 Plateau Pressure:  [22 cmH20-23 cmH20] 23 cmH20   Intake/Output Summary (Last 24 hours) at 06/20/2023 1252 Last data filed at 06/20/2023 1100 Gross per 24 hour  Intake 751.29 ml  Output 1470 ml  Net -718.71 ml   Filed Weights   06/18/23 2220 06/19/23 0505  Weight: 117 kg 119.9 kg    Examination: General: Crtitically ill-appearing morbidly obese male, orally intubated HEENT: [place]/AT, eyes anicteric.  ETT and OGT in place Neuro: Awake, following commands, moving all 4 extremities Chest: Coarse breath sounds, no wheezes or rhonchi Heart: Regular rate and rhythm, no murmurs or gallops Abdomen: Soft, nondistended, bowel sounds present Skin: No rash   Labs and images were reviewed  Resolved Hospital Problem list     Assessment & Plan:  Shock: etiology unclear.  Possible UTI Lactic acid has  cleared He is off vasopressor support now Remained afebrile Continue IV ceftriaxone Echocardiogram showed EF 35 to 40% which is decreased from prior with wall motion abnormalities but advanced heart failure  thanks his EF is close to 50% Patient will need TEE which is scheduled for tomorrow His troponin remains flat No EKG changes  Acute on chronic chronic HFpEF with new diagnosis of HFrEF Advanced heart failure team is following Patient will have TEE tomorrow Continue Lasix 80 mg daily Continue spironolactone As blood pressure is improved, started on losartan  Acute NSTEMI was ruled out with negative troponin and no EKG changes  Acute on chronic hypoxic/hypercapnic respiratory failure Continue lung protective ventilation PAD protocol with fentanyl Patient is tolerating spontaneous breathing trial Will wait for TEE to happen before we extubate him  COPD without acute exacerbation Continue Duonebs as needed and Budesonide  AKI on CKD stage IIIa Monitor intake and output Avoid nephrotoxic agent  Otitis externa  Continue ofloxacin drops to both ears daily  Diabetes type 2 with hypoglycemia Monitor fingerstick  Morbid obesity Diet and exercise counseling provided  Best Practice (right click and "Reselect all SmartList Selections" daily)   Diet/type: NPO DVT prophylaxis: prophylactic heparin  GI prophylaxis: PPI Lines: N/A Foley:  Yes, and it is still needed Code Status:  full code Last date of multidisciplinary goals of care discussion [7/23: Patient's wife was updated at bedside, decision was to continue full scope of care]  Labs   CBC: Recent Labs  Lab 06/18/23 2230 06/19/23 0633 06/20/23 0415  WBC 5.5 5.4 6.0  NEUTROABS 4.5  --   --   HGB 11.7* 10.9* 10.8*  HCT 39.6 37.7* 35.4*  MCV 98.8 96.9 98.6  PLT 164 166 154    Basic Metabolic Panel: Recent Labs  Lab 06/18/23 2230 06/19/23 0633 06/20/23 0415  NA 138 139 140  K 4.2 3.8 4.4  CL 97* 103 99  CO2 32 29 31  GLUCOSE 217* 109* 82  BUN 29* 24* 25*  CREATININE 1.64* 1.54* 1.44*  CALCIUM 8.3* 8.0* 8.1*  MG  --  2.0 2.0  PHOS  --  2.8 3.4   GFR: Estimated Creatinine Clearance: 58.4 mL/min (A) (by  C-G formula based on SCr of 1.44 mg/dL (H)). Recent Labs  Lab 06/18/23 2230 06/19/23 0017 06/19/23 0633 06/20/23 0415  PROCALCITON  --   --  0.46  --   WBC 5.5  --  5.4 6.0  LATICACIDVEN 1.4 1.6  --   --     Liver Function Tests: Recent Labs  Lab 06/18/23 2230 06/19/23 0633  AST 17 14*  ALT 14 12  ALKPHOS 79 70  BILITOT 0.7 0.6  PROT 7.3 6.2*  ALBUMIN 3.0* 2.5*   No results for input(s): "LIPASE", "AMYLASE" in the last 168 hours. No results for input(s): "AMMONIA" in the last 168 hours.  ABG    Component Value Date/Time   PHART 7.38 06/19/2023 0238   PCO2ART 62 (H) 06/19/2023 0238   PO2ART 104 06/19/2023 0238   HCO3 36.7 (H) 06/19/2023 0238   TCO2 33 (H) 05/11/2023 0745   ACIDBASEDEF 0.8 02/10/2017 1740   O2SAT 98.9 06/19/2023 0238     Coagulation Profile: Recent Labs  Lab 06/18/23 2230 06/19/23 0633  INR 1.2 1.3*    Cardiac Enzymes: No results for input(s): "CKTOTAL", "CKMB", "CKMBINDEX", "TROPONINI" in the last 168 hours.  HbA1C: Hgb A1c MFr Bld  Date/Time Value Ref Range Status  05/11/2023 08:20 AM 8.6 (H) 4.8 - 5.6 %  Final    Comment:    (NOTE) Pre diabetes:          5.7%-6.4%  Diabetes:              >6.4%  Glycemic control for   <7.0% adults with diabetes   05/06/2023 06:49 PM 8.6 (H) 4.8 - 5.6 % Final    Comment:    (NOTE) Pre diabetes:          5.7%-6.4%  Diabetes:              >6.4%  Glycemic control for   <7.0% adults with diabetes     CBG: Recent Labs  Lab 06/19/23 1645 06/19/23 1949 06/20/23 0055 06/20/23 0739 06/20/23 1122  GLUCAP 92 77 75 93 102*    The patient is critically ill due to shock/acute on chronic respiratory failure with hypoxia and hypercapnia.  Critical care was necessary to treat or prevent imminent or life-threatening deterioration.  Critical care was time spent personally by me on the following activities: development of treatment plan with patient and/or surrogate as well as nursing, discussions with  consultants, evaluation of patient's response to treatment, examination of patient, obtaining history from patient or surrogate, ordering and performing treatments and interventions, ordering and review of laboratory studies, ordering and review of radiographic studies, pulse oximetry, re-evaluation of patient's condition and participation in multidisciplinary rounds.   During this encounter critical care time was devoted to patient care services described in this note for 38 minutes.     Cheri Fowler, MD [facility] See Amion for pager If no response to pager, please call [phone] until 7pm After 7pm, Please call E-link [phone]

## 2023-06-20 NOTE — Progress Notes (Addendum)
Advanced Heart Failure Rounding Note  PCP-Cardiologist: Olga Millers, MD   Subjective:    Off NE.   Fever resolved. Continues on ceftriaxone for possible UTI.   Had ~ 25 beat run NSVT around 7:45 am, exact duration difficult d/t some artifact  Awake on vent. Following commands.   Objective:   Weight Range: 119.9 kg Body mass index is 40.19 kg/m.   Vital Signs:   Temp:  [99.3 F (37.4 C)-100.8 F (38.2 C)] 99.3 F (37.4 C) (07/23 0600) Pulse Rate:  [54-76] 73 (07/23 0600) Resp:  [13-26] 20 (07/23 0600) BP: (90-140)/(44-81) 104/62 (07/23 0600) SpO2:  [77 %-99 %] 95 % (07/23 0600) FiO2 (%):  [40 %] 40 % (07/23 0257) Last BM Date :  (PTA)  Weight change: Filed Weights   06/18/23 2220 06/19/23 0505  Weight: 117 kg 119.9 kg    Intake/Output:   Intake/Output Summary (Last 24 hours) at 06/20/2023 0741 Last data filed at 06/20/2023 0400 Gross per 24 hour  Intake 644.56 ml  Output 800 ml  Net -155.44 ml      Physical Exam    General:  Well appearing. No resp difficulty HEENT: Normal Neck: Supple. JVP . Carotids 2+ bilat; no bruits. No lymphadenopathy or thyromegaly appreciated. Cor: PMI nondisplaced. Regular rate & rhythm. No rubs, gallops or murmurs. Lungs: Clear Abdomen: Soft, nontender, nondistended. No hepatosplenomegaly. No bruits or masses. Good bowel sounds. Extremities: No cyanosis, clubbing, rash, edema Neuro: Alert & orientedx3, cranial nerves grossly intact. moves all 4 extremities w/o difficulty. Affect pleasant   Telemetry   SR/sinus brady 50s-60s, PACs, > 25 beat run NSVT   Labs    CBC Recent Labs    06/18/23 2230 06/19/23 0633 06/20/23 0415  WBC 5.5 5.4 6.0  NEUTROABS 4.5  --   --   HGB 11.7* 10.9* 10.8*  HCT 39.6 37.7* 35.4*  MCV 98.8 96.9 98.6  PLT 164 166 154   Basic Metabolic Panel Recent Labs    95/28/41 0633 06/20/23 0415  NA 139 140  K 3.8 4.4  CL 103 99  CO2 29 31  GLUCOSE 109* 82  BUN 24* 25*  CREATININE  1.54* 1.44*  CALCIUM 8.0* 8.1*  MG 2.0 2.0  PHOS 2.8 3.4   Liver Function Tests Recent Labs    06/18/23 2230 06/19/23 0633  AST 17 14*  ALT 14 12  ALKPHOS 79 70  BILITOT 0.7 0.6  PROT 7.3 6.2*  ALBUMIN 3.0* 2.5*   No results for input(s): "LIPASE", "AMYLASE" in the last 72 hours. Cardiac Enzymes No results for input(s): "CKTOTAL", "CKMB", "CKMBINDEX", "TROPONINI" in the last 72 hours.  BNP: BNP (last 3 results) Recent Labs    05/17/23 0246 05/18/23 0449 06/18/23 2230  BNP 35.1 34.0 54.0    ProBNP (last 3 results) No results for input(s): "PROBNP" in the last 8760 hours.   D-Dimer No results for input(s): "DDIMER" in the last 72 hours. Hemoglobin A1C No results for input(s): "HGBA1C" in the last 72 hours. Fasting Lipid Panel No results for input(s): "CHOL", "HDL", "LDLCALC", "TRIG", "CHOLHDL", "LDLDIRECT" in the last 72 hours. Thyroid Function Tests Recent Labs    06/19/23 0633  TSH 1.062    Other results:   Imaging    ECHOCARDIOGRAM COMPLETE  Result Date: 06/19/2023    ECHOCARDIOGRAM REPORT   Patient Name:   Ricky FLURY Sr. Date of Exam: 06/19/2023 Medical Rec #:  [id number]          Height:  68.0 in Accession #:    [id number]         Weight:       264.3 lb Date of Birth:  [birth year]          BSA:          2.301 m Patient Age:    72 years           BP:           104/65 mmHg Patient Gender: M                  HR:           65 bpm. Exam Location:  Inpatient Procedure: 2D Echo, Cardiac Doppler, Color Doppler and Intracardiac            Opacification Agent Indications:    Shock  History:        Patient has prior history of Echocardiogram examinations, most                 recent 05/11/2023. CHF, CAD, COPD; Risk Factors:Hypertension and                 Diabetes.  Sonographer:    Darlys Gales Referring Phys: [phone] Clarene Critchley HOFFMAN IMPRESSIONS  1. Left ventricular ejection fraction, by estimation, is 35 to 40%. The left ventricle has moderately decreased function.  The left ventricle demonstrates regional wall motion abnormalities (see scoring diagram/findings for description). Left ventricular  diastolic parameters are indeterminate.  2. Right ventricular systolic function was not well visualized. The right ventricular size is not well visualized. Tricuspid regurgitation signal is inadequate for assessing PA pressure.  3. The mitral valve is normal in structure. No evidence of mitral valve regurgitation.  4. The aortic valve was not well visualized. Aortic valve regurgitation is not visualized. No aortic stenosis is present.  5. The inferior vena cava is dilated in size with >50% respiratory variability, suggesting right atrial pressure of 8 mmHg. FINDINGS  Left Ventricle: Left ventricular ejection fraction, by estimation, is 35 to 40%. The left ventricle has moderately decreased function. The left ventricle demonstrates regional wall motion abnormalities. Definity contrast agent was given IV to delineate the left ventricular endocardial borders. The left ventricular internal cavity size was normal in size. There is no left ventricular hypertrophy. Left ventricular diastolic parameters are indeterminate.  LV Wall Scoring: The entire lateral wall is akinetic. The entire anterior wall, entire septum, entire inferior wall, and apex are normal. Right Ventricle: The right ventricular size is not well visualized. Right vetricular wall thickness was not well visualized. Right ventricular systolic function was not well visualized. Tricuspid regurgitation signal is inadequate for assessing PA pressure. Left Atrium: Left atrial size was normal in size. Right Atrium: Right atrial size was normal in size. Pericardium: There is no evidence of pericardial effusion. Mitral Valve: The mitral valve is normal in structure. No evidence of mitral valve regurgitation. Tricuspid Valve: The tricuspid valve is normal in structure. Tricuspid valve regurgitation is trivial. Aortic Valve: The aortic  valve was not well visualized. Aortic valve regurgitation is not visualized. No aortic stenosis is present. Aortic valve mean gradient measures 7.0 mmHg. Aortic valve peak gradient measures 12.2 mmHg. Aortic valve area, by VTI measures 1.98 cm. Pulmonic Valve: The pulmonic valve was not well visualized. Pulmonic valve regurgitation is not visualized. Aorta: The aortic root and ascending aorta are structurally normal, with no evidence of dilitation. Venous: The inferior vena cava is dilated in size with  greater than 50% respiratory variability, suggesting right atrial pressure of 8 mmHg. IAS/Shunts: The interatrial septum was not well visualized.  LEFT VENTRICLE PLAX 2D LVIDd:         5.50 cm   Diastology LVIDs:         4.60 cm   LV e' medial:    7.51 cm/s LV PW:         0.90 cm   LV E/e' medial:  12.2 LV IVS:        0.70 cm   LV e' lateral:   12.60 cm/s LVOT diam:     1.90 cm   LV E/e' lateral: 7.3 LV SV:         67 LV SV Index:   29 LVOT Area:     2.84 cm  RIGHT VENTRICLE             IVC RV S prime:     14.60 cm/s  IVC diam: 2.30 cm TAPSE (M-mode): 2.4 cm LEFT ATRIUM             Index        RIGHT ATRIUM           Index LA Vol (A2C):   27.4 ml 11.91 ml/m  RA Area:     14.50 cm LA Vol (A4C):   48.9 ml 21.25 ml/m  RA Volume:   38.00 ml  16.52 ml/m LA Biplane Vol: 37.1 ml 16.12 ml/m  AORTIC VALVE AV Area (Vmax):    2.35 cm AV Area (Vmean):   2.29 cm AV Area (VTI):     1.98 cm AV Vmax:           175.00 cm/s AV Vmean:          124.000 cm/s AV VTI:            0.336 m AV Peak Grad:      12.2 mmHg AV Mean Grad:      7.0 mmHg LVOT Vmax:         145.00 cm/s LVOT Vmean:        100.000 cm/s LVOT VTI:          0.235 m LVOT/AV VTI ratio: 0.70  AORTA Ao Root diam: 3.10 cm Ao Asc diam:  3.70 cm MITRAL VALVE MV Area (PHT): 2.41 cm    SHUNTS MV Decel Time: 315 msec    Systemic VTI:  0.24 m MV E velocity: 91.70 cm/s  Systemic Diam: 1.90 cm MV A velocity: 70.20 cm/s MV E/A ratio:  1.31 Epifanio Lesches MD Electronically  signed by Epifanio Lesches MD Signature Date/Time: 06/19/2023/11:40:08 AM    Final      Medications:     Scheduled Medications:  Chlorhexidine Gluconate Cloth  6 each Topical Daily   fentaNYL (SUBLIMAZE) injection  100 mcg Intravenous Once   heparin  5,000 Units Subcutaneous Q8H   insulin aspart  0-15 Units Subcutaneous Q4H   ofloxacin  5 drop Both EARS Daily   pantoprazole (PROTONIX) IV  40 mg Intravenous QHS   spironolactone  12.5 mg Per Tube Daily    Infusions:  sodium chloride     cefTRIAXone (ROCEPHIN)  IV Stopped (06/19/23 1105)   dextrose 25 mL/hr at 06/20/23 0400   fentaNYL infusion INTRAVENOUS 85 mcg/hr (06/20/23 0400)   norepinephrine (LEVOPHED) Adult infusion Stopped (06/19/23 1423)    PRN Medications: acetaminophen, docusate, polyethylene glycol    Patient Profile    72 y.o. male with history of HFpEF, chronic  respiratory failure on home O2, OSA on BiPAP, COPD. Multiple recent hospitalizations, now readmitted with shock and acute on chronic respiratory failure. Echo with newly reduced EF.  Assessment/Plan   Shock -Etiology does not appear  -Lactic acid okay -Now off NE -UA suggestive of UTI, now on ceftriaxone. Culture pending. Fever resolved. -Suspect presentation not primarily cardiac related. ? COPD +/- possible urosespsis -No ACS. HS troponin negative X 2. -EF reported as 35-40% with lateral wall AK on echo. Per Dr. Gala Romney read EF appears to be 45-50% and no convincing lateral wall AK but difficult to see. Will consider TEE tomorrow to better assess LV.    2. HFpEF with newly reduced EF -Had mild CAD on cath in 2016 -EF 55-60% on echo 06/24 -Echo as above. EF appears closer to 45-50% per Dr. Gala Romney read. -Recently wore 14 day Zio: SR  w/ frequent PACs (15%), 1% PVC burden. No arrhythmias.  -Does not appear low-output -Appears volume up on exam. Give IV lasix 80 mg. -Continue spiro 12.5 mg daily -No SGLT2i for now with UTI -Start 12.5 mg  losartan daily. BP too soft for ARNi   3. Acute on chronic respiratory failure with hypoxia and hypercarbia, on home O2 COPD Restrictive lung disease OSA on BiPAP -Vent management per CCM -[facility]s with pulmonary at [facility]   4. UTI -On empiric ceftriaxone -Culture pending   5. CKD III -Recently Cr has been variable, unclear baseline but suspect 1.2-1.4 -Scr 1.6>1.5>1.44 this admit -Follow   6. DM II -Insulin dependent -A1c 8.6 06/24 -Per primary -No SGLT2i with UTI  7. NSVT - 25 beat run on tele this am.  - Keep K > 4 and Mag > 2 - Follow burden on tele    Length of Stay: 1  FINCH, LINDSAY N, PA-C  06/20/2023, 7:41 AM  Advanced Heart Failure Team Pager [phone] (M-F; 7a - 5p)  Please contact [facility] for night-coverage after hours (5p -7a ) and weekends on [email or web address]  Agree with above.   Will arouse on vent and follow commands. Now off NE. MAPs stable. Had NSVT this am. Ona bx for UTI.   General:  On vent. Currently sedated  HEENT: normal Neck: supple. no JVD. Carotids 2+ bilat; no bruits. No lymphadenopathy or thryomegaly appreciated. Cor: PMI nondisplaced. Regular rate & rhythm. No rubs, gallops or murmurs. Lungs: coarse Abdomen: obese soft, nontender, nondistended. No hepatosplenomegaly. No bruits or masses. Good bowel sounds. Extremities: no cyanosis, clubbing, rash, edema Neuro: intubated sedated  TEE images very difficult. I am not sure that there is significant LV dysfunction. Hstrop are negative so no evidence ACS.   Will plan TEE tomorrow am at bedside to further evaluate. Keep intubated until that time. D/w CCM  If more NSVT can add amio.   CRITICAL CARE Performed by: Arvilla Meres  Total critical care time: 35 minutes  Critical care time was exclusive of separately billable procedures and treating other patients.  Critical care was necessary to treat or prevent imminent or life-threatening deterioration.  Critical care was time spent  personally by me (independent of midlevel providers or residents) on the following activities: development of treatment plan with patient and/or surrogate as well as nursing, discussions with consultants, evaluation of patient's response to treatment, examination of patient, obtaining history from patient or surrogate, ordering and performing treatments and interventions, ordering and review of laboratory studies, ordering and review of radiographic studies, pulse oximetry and re-evaluation of patient's condition.  Arvilla Meres, MD  2:28 PM

## 2023-06-21 ENCOUNTER — Inpatient Hospital Stay (HOSPITAL_COMMUNITY): Payer: No Typology Code available for payment source

## 2023-06-21 DIAGNOSIS — I509 Heart failure, unspecified: Secondary | ICD-10-CM

## 2023-06-21 DIAGNOSIS — I361 Nonrheumatic tricuspid (valve) insufficiency: Secondary | ICD-10-CM | POA: Diagnosis not present

## 2023-06-21 DIAGNOSIS — I371 Nonrheumatic pulmonary valve insufficiency: Secondary | ICD-10-CM

## 2023-06-21 DIAGNOSIS — J9621 Acute and chronic respiratory failure with hypoxia: Secondary | ICD-10-CM | POA: Diagnosis not present

## 2023-06-21 DIAGNOSIS — J9622 Acute and chronic respiratory failure with hypercapnia: Secondary | ICD-10-CM | POA: Diagnosis not present

## 2023-06-21 DIAGNOSIS — R579 Shock, unspecified: Secondary | ICD-10-CM | POA: Diagnosis not present

## 2023-06-21 LAB — PHOSPHORUS
Phosphorus: 3.4 mg/dL (ref 2.5–4.6)
Phosphorus: 2.9 mg/dL (ref 2.5–4.6)

## 2023-06-21 LAB — GLUCOSE, CAPILLARY
Glucose-Capillary: 156 mg/dL — ABNORMAL HIGH (ref 70–99)
Glucose-Capillary: 163 mg/dL — ABNORMAL HIGH (ref 70–99)
Glucose-Capillary: 178 mg/dL — ABNORMAL HIGH (ref 70–99)
Glucose-Capillary: 182 mg/dL — ABNORMAL HIGH (ref 70–99)
Glucose-Capillary: 187 mg/dL — ABNORMAL HIGH (ref 70–99)
Glucose-Capillary: 195 mg/dL — ABNORMAL HIGH (ref 70–99)

## 2023-06-21 LAB — CBC
HCT: 34 % — ABNORMAL LOW (ref 39.0–52.0)
Hemoglobin: 10.3 g/dL — ABNORMAL LOW (ref 13.0–17.0)
MCH: 29.1 pg (ref 26.0–34.0)
MCHC: 30.3 g/dL (ref 30.0–36.0)
MCV: 96 fL (ref 80.0–100.0)
Platelets: 166 10*3/uL (ref 150–400)
RBC: 3.54 MIL/uL — ABNORMAL LOW (ref 4.22–5.81)
RDW: 15.5 % (ref 11.5–15.5)
WBC: 4.5 10*3/uL (ref 4.0–10.5)
nRBC: 0 % (ref 0.0–0.2)

## 2023-06-21 LAB — BASIC METABOLIC PANEL
Anion gap: 10 (ref 5–15)
BUN: 33 mg/dL — ABNORMAL HIGH (ref 8–23)
CO2: 32 mmol/L (ref 22–32)
Calcium: 8.4 mg/dL — ABNORMAL LOW (ref 8.9–10.3)
Chloride: 99 mmol/L (ref 98–111)
Creatinine, Ser: 1.44 mg/dL — ABNORMAL HIGH (ref 0.61–1.24)
GFR, Estimated: 52 mL/min — ABNORMAL LOW (ref >60)
Glucose, Bld: 176 mg/dL — ABNORMAL HIGH (ref 70–99)
Potassium: 4.1 mmol/L (ref 3.5–5.1)
Sodium: 141 mmol/L (ref 135–145)

## 2023-06-21 LAB — MAGNESIUM
Magnesium: 2.1 mg/dL (ref 1.7–2.4)
Magnesium: 2.3 mg/dL (ref 1.7–2.4)

## 2023-06-21 MED ORDER — NYSTATIN 100000 UNIT/GM EX POWD
Freq: Two times a day (BID) | CUTANEOUS | Status: DC
  Filled 2023-06-21: qty 15

## 2023-06-21 MED ORDER — PHENYLEPHRINE 80 MCG/ML (10ML) SYRINGE FOR IV PUSH (FOR BLOOD PRESSURE SUPPORT)
80.0000 ug | PREFILLED_SYRINGE | Freq: Once | INTRAVENOUS | Status: DC
  Filled 2023-06-21: qty 10

## 2023-06-21 MED ORDER — FUROSEMIDE 10 MG/ML IJ SOLN
80.0000 mg | Freq: Two times a day (BID) | INTRAMUSCULAR | Status: AC
  Administered 2023-06-21 – 2023-06-22 (×2): 80 mg via INTRAVENOUS
  Filled 2023-06-21 (×2): qty 8

## 2023-06-21 MED ORDER — POTASSIUM CHLORIDE 20 MEQ PO PACK
40.0000 meq | PACK | Freq: Once | ORAL | Status: AC
  Administered 2023-06-21: 40 meq
  Filled 2023-06-21: qty 2

## 2023-06-21 MED ORDER — MIDAZOLAM HCL 2 MG/2ML IJ SOLN
2.0000 mg | Freq: Once | INTRAMUSCULAR | Status: AC
  Administered 2023-06-21: 6 mg via INTRAVENOUS
  Filled 2023-06-21: qty 6

## 2023-06-21 MED ORDER — LOSARTAN POTASSIUM 25 MG PO TABS
25.0000 mg | ORAL_TABLET | Freq: Every day | ORAL | Status: DC
  Administered 2023-06-21: 12.5 mg
  Administered 2023-06-22: 25 mg
  Filled 2023-06-21 (×2): qty 1

## 2023-06-21 MED ORDER — APIXABAN 5 MG PO TABS
5.0000 mg | ORAL_TABLET | Freq: Two times a day (BID) | ORAL | Status: DC
  Administered 2023-06-21: 5 mg via ORAL
  Filled 2023-06-21: qty 1

## 2023-06-21 MED ORDER — APIXABAN 5 MG PO TABS
5.0000 mg | ORAL_TABLET | Freq: Two times a day (BID) | ORAL | Status: DC
  Administered 2023-06-22 (×2): 5 mg
  Filled 2023-06-21 (×2): qty 1

## 2023-06-21 NOTE — CV Procedure (Signed)
    TRANSESOPHAGEAL ECHOCARDIOGRAM   NAME:  Philomena Course Sr.   MRN: [id number] DOB:  [birth year]   ADMIT DATE: 06/18/2023  INDICATIONS:  Shock  PROCEDURE:   Informed consent was obtained prior to the procedure. The risks, benefits and alternatives for the procedure were discussed and the patient comprehended these risks.  Risks include, but are not limited to, cough, sore throat, vomiting, nausea, somnolence, esophageal and stomach trauma or perforation, bleeding, low blood pressure, aspiration, pneumonia, infection, trauma to the teeth and death.    The patient was already intubated on vent. We sedated him further with versed 6 and fentanyl 150. The transesophageal probe was inserted in the esophagus and stomach without difficulty and multiple views were obtained.    COMPLICATIONS:    There were no immediate complications.  FINDINGS:  LEFT VENTRICLE: EF = 50-55%. No regional wall motion abnormalities.  RIGHT VENTRICLE: Normal size. Moderately reduced  LEFT ATRIUM: Normal  LEFT ATRIAL APPENDAGE: No thrombus.   RIGHT ATRIUM: Normal  AORTIC VALVE:  Trileaflet. No AS/AI  MITRAL VALVE:    Normal. Trivial MR  TRICUSPID VALVE: Normal. Trivial TR  PULMONIC VALVE: Grossly normal. Mild PR  INTERATRIAL SEPTUM: + lipomatous hypertrophy. No PFO or ASD.  PERICARDIUM: No effusion  DESCENDING AORTA: Mild plaque      Inman Fettig,MD 5:22 PM

## 2023-06-21 NOTE — Progress Notes (Addendum)
Advanced Heart Failure Rounding Note  PCP-Cardiologist: Olga Millers, MD   Subjective:    Stable off NE.   Fever resolved. Continues on ceftriaxone for possible UTI.   No recurrent VT overnight.  3L UOP yesterday with 80 mg lasix IV.  Awake on vent. Following commands.   Objective:   Weight Range: 119.9 kg Body mass index is 40.19 kg/m.   Vital Signs:   Temp:  [98.2 F (36.8 C)-99.3 F (37.4 C)] 99.1 F (37.3 C) (07/24 0900) Pulse Rate:  [48-80] 49 (07/24 0900) Resp:  [11-29] 19 (07/24 0900) BP: (94-125)/(46-84) 117/56 (07/24 0900) SpO2:  [94 %-98 %] 95 % (07/24 0900) FiO2 (%):  [40 %] 40 % (07/24 0753) Last BM Date :  (PTA)  Weight change: Filed Weights   06/18/23 2220 06/19/23 0505  Weight: 117 kg 119.9 kg    Intake/Output:   Intake/Output Summary (Last 24 hours) at 06/21/2023 1015 Last data filed at 06/21/2023 0900 Gross per 24 hour  Intake 617.83 ml  Output 2830 ml  Net -2212.17 ml      Physical Exam    General:  Chronically ill appearing. On vent. HEENT: + ETT/OG, + otitis externa Neck: Supple. JVP difficult d/t thick neck but appears elevated . Carotids 2+ bilat; no bruits.  Cor: PMI nondisplaced. Regular rate & rhythm. No rubs, gallops or murmurs. Lungs: Clear Abdomen: Obese, soft, nontender, nondistended.  Extremities: No cyanosis, clubbing, rash, 2+ edema to thighs Neuro: Awake on vent   Telemetry   SR/sinus brady 50s-60s, PACs and PVCs   Labs    CBC Recent Labs    06/18/23 2230 06/19/23 0633 06/20/23 0415 06/21/23 0620  WBC 5.5   < > 6.0 4.5  NEUTROABS 4.5  --   --   --   HGB 11.7*   < > 10.8* 10.3*  HCT 39.6   < > 35.4* 34.0*  MCV 98.8   < > 98.6 96.0  PLT 164   < > 154 166   < > = values in this interval not displayed.   Basic Metabolic Panel Recent Labs    40/98/11 0415 06/20/23 1607 06/21/23 0620  NA 140  --  141  K 4.4  --  4.1  CL 99  --  99  CO2 31  --  32  GLUCOSE 82  --  176*  BUN 25*  --  33*   CREATININE 1.44*  --  1.44*  CALCIUM 8.1*  --  8.4*  MG 2.0 1.9 2.1  PHOS 3.4 4.4 3.4   Liver Function Tests Recent Labs    06/18/23 2230 06/19/23 0633  AST 17 14*  ALT 14 12  ALKPHOS 79 70  BILITOT 0.7 0.6  PROT 7.3 6.2*  ALBUMIN 3.0* 2.5*   No results for input(s): "LIPASE", "AMYLASE" in the last 72 hours. Cardiac Enzymes No results for input(s): "CKTOTAL", "CKMB", "CKMBINDEX", "TROPONINI" in the last 72 hours.  BNP: BNP (last 3 results) Recent Labs    05/17/23 0246 05/18/23 0449 06/18/23 2230  BNP 35.1 34.0 54.0    ProBNP (last 3 results) No results for input(s): "PROBNP" in the last 8760 hours.   D-Dimer No results for input(s): "DDIMER" in the last 72 hours. Hemoglobin A1C No results for input(s): "HGBA1C" in the last 72 hours. Fasting Lipid Panel No results for input(s): "CHOL", "HDL", "LDLCALC", "TRIG", "CHOLHDL", "LDLDIRECT" in the last 72 hours. Thyroid Function Tests Recent Labs    06/19/23 0633  TSH 1.062  Other results:   Imaging    No results found.   Medications:     Scheduled Medications:  Chlorhexidine Gluconate Cloth  6 each Topical Daily   docusate  100 mg Per Tube BID   feeding supplement (PROSource TF20)  60 mL Per Tube BID   fentaNYL (SUBLIMAZE) injection  25 mcg Intravenous Once   heparin  5,000 Units Subcutaneous Q8H   insulin aspart  0-15 Units Subcutaneous Q4H   losartan  12.5 mg Per Tube Daily   ofloxacin  5 drop Both EARS Daily   pantoprazole (PROTONIX) IV  40 mg Intravenous QHS   polyethylene glycol  17 g Per Tube Daily   spironolactone  12.5 mg Per Tube Daily    Infusions:  sodium chloride     cefTRIAXone (ROCEPHIN)  IV Stopped (06/20/23 1055)   feeding supplement (VITAL 1.5 CAL) 40 mL/hr at 06/21/23 0831   fentaNYL infusion INTRAVENOUS 50 mcg/hr (06/21/23 0800)   norepinephrine (LEVOPHED) Adult infusion Stopped (06/19/23 1423)    PRN Medications: acetaminophen, docusate, fentaNYL, polyethylene  glycol    Patient Profile    72 y.o. male with history of HFpEF, chronic respiratory failure on home O2, OSA on BiPAP, COPD. Multiple recent hospitalizations, now readmitted with shock and acute on chronic respiratory failure. Echo with newly reduced EF.  Assessment/Plan   Shock -Etiology does not appear  -Lactic acid okay -Now off NE -UA suggestive of UTI, now on ceftriaxone. Culture pending. Fever resolved. -Suspect presentation not primarily cardiac related. ? COPD +/- possible urosespsis -No ACS. HS troponin negative X 2. -EF reported as 35-40% with lateral wall AK on echo. Per Dr. Gala Romney read EF appears to be 45-50% and no convincing lateral wall AK but difficult to see.  -TEE today to better assess LV function   2. HFpEF with newly reduced EF -Had mild CAD on cath in 2016 -EF 55-60% on echo 06/24 -Echo as above. EF appears closer to 45-50% per Dr. Gala Romney read. -Recently wore 14 day Zio: SR  w/ frequent PACs (15%), 1% PVC burden. No arrhythmias.  -Does not appear low-output -Appears volume up on exam. Give IV lasix 80 mg. BID today -Continue spiro 12.5 mg daily -No SGLT2i for now with UTI -Increase losartan to 25 mg daily. BP too soft for ARNi   3. Acute on chronic respiratory failure with hypoxia and hypercarbia, on home O2 COPD Restrictive lung disease OSA on BiPAP -Vent management per CCM -Follows with pulmonary at [facility]   4. UTI -On empiric ceftriaxone -Culture pending   5. CKD III -Recently Cr has been variable, unclear baseline but suspect 1.2-1.4 -Scr 1.6>1.5>1.44>1.44 this admit -Follow   6. DM II -Insulin dependent -A1c 8.6 06/24 -Per primary -No SGLT2i with UTI  7. NSVT - 25 beat run on 07/23 - No recurrent VT overnight - Keep K > 4 and Mag > 2    Length of Stay: 2  FINCH, LINDSAY N, PA-C  06/21/2023, 10:15 AM  Advanced Heart Failure Team Pager [phone] (M-F; 7a - 5p)  Please contact [facility] for night-coverage after hours  (5p -7a ) and weekends on [email or web address]  Agree with above.   Patient remains intubated. Will awaken and follow commands. Off pressors. VT quiescent overnight  General:  Obese male on vent HEENT: normal + ETT Neck: supple.hard to see JVP Carotids 2+ bilat; no bruits. No lymphadenopathy or thryomegaly appreciated. Cor: Regular rate & rhythm. No rubs, gallops or murmurs. Lungs: coarse Abdomen: obese soft, nontender, nondistended.  No hepatosplenomegaly. No bruits or masses. Good bowel sounds. Extremities: no cyanosis, clubbing, rash, edema Neuro: on vent follows commands  He is intubated but weaning vent. I did TEE at bedside and LVEF 50-55% with moderate RV dysfunction. Given normal hstrop and normal EF. Doubt there was a significant cardiac event here. Suspect chest wall echo underestimated initial EF.  Main issue like UTI and recurrent respiratory failure.   D/w CCM. AHF team will s/o.   Please call with questions.   CRITICAL CARE Performed by: Arvilla Meres  Total critical care time: 45 minutes  Critical care time was exclusive of separately billable procedures and treating other patients.  Critical care was necessary to treat or prevent imminent or life-threatening deterioration.  Critical care was time spent personally by me (independent of midlevel providers or residents) on the following activities: development of treatment plan with patient and/or surrogate as well as nursing, discussions with consultants, evaluation of patient's response to treatment, examination of patient, obtaining history from patient or surrogate, ordering and performing treatments and interventions, ordering and review of laboratory studies, ordering and review of radiographic studies, pulse oximetry and re-evaluation of patient's condition.  Arvilla Meres, MD  6:10 PM

## 2023-06-21 NOTE — Progress Notes (Signed)
NAME:  Ricky Urbas., MRN:  [id number], DOB:  07-Mar-1951, LOS: 2 ADMISSION DATE:  06/18/2023, CONSULTATION DATE:  7/22 REFERRING MD:  Dr Preston Fleeting EDP, CHIEF COMPLAINT:  hypotension   History of Present Illness:  Patient is encephalopathic and/or intubated; therefore, history has been obtained from chart review.  72 year old male with past medical history as below, which is significant for hypertension, hyperlipidemia, CHF, CAD, COPD, chronic respiratory failure on oxygen, sleep apnea on CPAP, and diabetes.  He was recently admitted to [facility] twice.  The first on June 8 for syncope which was ultimately felt to be vasovagal in nature.  He was again admitted on 6/13 in respiratory distress and was treated for COPD and CHF exacerbations.  He was discharged to SNF with a new oxygen requirement.  He was discharged from SNF on 7/20 and on 7/21 he again presented to [facility] emergency department with weakness and altered sensorium.  Upon arrival to the emergency department he was noted to be hypotensive which initially responded to IV fluids, however, he ultimately required norepinephrine infusion.  Lactic acid was normal, however.  He did have increased work of breathing for which she was started on BiPAP.  Chest x-ray was unremarkable.  ABG showed very mild respiratory acidosis and the patient was intubated.  He was transferred to [facility] for ICU admission.  Pertinent  Medical History   has a past medical history of Arthritis, Cancer (HCC), CHF (congestive heart failure) (HCC), Complication of anesthesia, COPD (chronic obstructive pulmonary disease) (HCC), Coronary artery disease, Diabetes mellitus without complication (HCC), DVT (deep venous thrombosis) (HCC) (01/08/2014, 2010), GERD (gastroesophageal reflux disease), Hallux limitus (05/18/2015), Headache, History of kidney stones, Hypercholesterolemia, Hypertension, Impaired hearing, Intervertebral disc syndrome, Kidney stones,  Pulmonary fibrosis (HCC), Renal calculi (01/08/2014), Renal insufficiency (06/19/2023), Restless legs (01/08/2014), Sciatic leg pain, Sleep apnea, and Tinnitus.   Significant Hospital Events: Including procedures, antibiotic start and stop dates in addition to other pertinent events   6/8 admit for syncope felt to be vasovagal 6/18 admit for CHF exacerbation, dc to SNF.  7/21 admit for hypotension, respiratory failure.   Interim History / Subjective:  Patient remained afebrile Remain off vasopressor support No episodes of VT's 3 L urine output   Objective   Blood pressure (!) 117/56, pulse (!) 49, temperature 99.1 F (37.3 C), resp. rate 19, height 5\' 8"  (1.727 m), weight 119.9 kg, SpO2 95%.    Vent Mode: PRVC FiO2 (%):  [40 %] 40 % Set Rate:  [20 bmp] 20 bmp Vt Set:  [540 mL] 540 mL PEEP:  [5 cmH20] 5 cmH20 Pressure Support:  [10 cmH20] 10 cmH20 Plateau Pressure:  [18 cmH20-20 cmH20] 20 cmH20   Intake/Output Summary (Last 24 hours) at 06/21/2023 1051 Last data filed at 06/21/2023 0900 Gross per 24 hour  Intake 617.83 ml  Output 2830 ml  Net -2212.17 ml   Filed Weights   06/18/23 2220 06/19/23 0505  Weight: 117 kg 119.9 kg    Examination: General: Elderly morbidly obese male, orally intubated HEENT: [place]/AT, eyes anicteric.  ETT and OGT in place Neuro: Opens eyes with stimuli, following simple commands Chest: Coarse breath sounds, no wheezes or rhonchi Heart: Regular rate and rhythm, no murmurs or gallops Abdomen: Soft, nondistended, bowel sounds present Skin: No rash  Labs and images were reviewed  Resolved Hospital Problem list   Shock, undifferentiated  Assessment & Plan:  Acute UTI Lactic acid has cleared Remained afebrile Continue IV ceftriaxone to  complete 5 days therapy Echocardiogram showed EF 35 to 40% which is decreased from prior with wall motion abnormalities but advanced heart failure thanks his EF is close to 50% Patient is scheduled for TEE  today  Acute on chronic chronic HFpEF with new diagnosis of HFrEF Advanced heart failure team is following Patient will have TEE today Continue Lasix 80 mg daily Continue spironolactone Continue losartan  Acute NSTEMI was ruled out with negative troponin and no EKG changes  Acute on chronic hypoxic/hypercapnic respiratory failure Continue lung protective ventilation PAD protocol with fentanyl Patient is tolerating spontaneous breathing trial, will extubate him after TEE  COPD without acute exacerbation Continue Duonebs as needed and Budesonide  AKI on CKD stage IIIa Serum Cr is stable around 1.4 Monitor intake and output Avoid nephrotoxic agent  Otitis externa  Continue ofloxacin drops to both ears daily  Diabetes type 2 with hypoglycemia Monitor fingerstick  Morbid obesity Diet and exercise counseling provided  Best Practice (right click and "Reselect all SmartList Selections" daily)   Diet/type: NPO DVT prophylaxis: prophylactic heparin  GI prophylaxis: PPI Lines: N/A Foley:  Yes, and it is still needed Code Status:  full code Last date of multidisciplinary goals of care discussion [7/24: Patient's wife was updated at bedside, decision was to continue full scope of care]  Labs   CBC: Recent Labs  Lab 06/18/23 2230 06/19/23 0633 06/20/23 0415 06/21/23 0620  WBC 5.5 5.4 6.0 4.5  NEUTROABS 4.5  --   --   --   HGB 11.7* 10.9* 10.8* 10.3*  HCT 39.6 37.7* 35.4* 34.0*  MCV 98.8 96.9 98.6 96.0  PLT 164 166 154 166    Basic Metabolic Panel: Recent Labs  Lab 06/18/23 2230 06/19/23 0633 06/20/23 0415 06/20/23 1607 06/21/23 0620  NA 138 139 140  --  141  K 4.2 3.8 4.4  --  4.1  CL 97* 103 99  --  99  CO2 32 29 31  --  32  GLUCOSE 217* 109* 82  --  176*  BUN 29* 24* 25*  --  33*  CREATININE 1.64* 1.54* 1.44*  --  1.44*  CALCIUM 8.3* 8.0* 8.1*  --  8.4*  MG  --  2.0 2.0 1.9 2.1  PHOS  --  2.8 3.4 4.4 3.4   GFR: Estimated Creatinine Clearance: 58.4  mL/min (A) (by C-G formula based on SCr of 1.44 mg/dL (H)). Recent Labs  Lab 06/18/23 2230 06/19/23 0017 06/19/23 0633 06/20/23 0415 06/21/23 0620  PROCALCITON  --   --  0.46  --   --   WBC 5.5  --  5.4 6.0 4.5  LATICACIDVEN 1.4 1.6  --   --   --     Liver Function Tests: Recent Labs  Lab 06/18/23 2230 06/19/23 0633  AST 17 14*  ALT 14 12  ALKPHOS 79 70  BILITOT 0.7 0.6  PROT 7.3 6.2*  ALBUMIN 3.0* 2.5*   No results for input(s): "LIPASE", "AMYLASE" in the last 168 hours. No results for input(s): "AMMONIA" in the last 168 hours.  ABG    Component Value Date/Time   PHART 7.38 06/19/2023 0238   PCO2ART 62 (H) 06/19/2023 0238   PO2ART 104 06/19/2023 0238   HCO3 36.7 (H) 06/19/2023 0238   TCO2 33 (H) 05/11/2023 0745   ACIDBASEDEF 0.8 02/10/2017 1740   O2SAT 98.9 06/19/2023 0238     Coagulation Profile: Recent Labs  Lab 06/18/23 2230 06/19/23 0633  INR 1.2 1.3*  Cardiac Enzymes: No results for input(s): "CKTOTAL", "CKMB", "CKMBINDEX", "TROPONINI" in the last 168 hours.  HbA1C: Hgb A1c MFr Bld  Date/Time Value Ref Range Status  05/11/2023 08:20 AM 8.6 (H) 4.8 - 5.6 % Final    Comment:    (NOTE) Pre diabetes:          5.7%-6.4%  Diabetes:              >6.4%  Glycemic control for   <7.0% adults with diabetes   05/06/2023 06:49 PM 8.6 (H) 4.8 - 5.6 % Final    Comment:    (NOTE) Pre diabetes:          5.7%-6.4%  Diabetes:              >6.4%  Glycemic control for   <7.0% adults with diabetes     CBG: Recent Labs  Lab 06/20/23 1554 06/20/23 1952 06/21/23 0007 06/21/23 0357 06/21/23 0753  GLUCAP 113* 141* 163* 178* 182*    The patient is critically ill due to shock/acute on chronic respiratory failure with hypoxia and hypercapnia.  Critical care was necessary to treat or prevent imminent or life-threatening deterioration.  Critical care was time spent personally by me on the following activities: development of treatment plan with patient  and/or surrogate as well as nursing, discussions with consultants, evaluation of patient's response to treatment, examination of patient, obtaining history from patient or surrogate, ordering and performing treatments and interventions, ordering and review of laboratory studies, ordering and review of radiographic studies, pulse oximetry, re-evaluation of patient's condition and participation in multidisciplinary rounds.   During this encounter critical care time was devoted to patient care services described in this note for 32 minutes.     Cheri Fowler, MD [facility] See Amion for pager If no response to pager, please call [phone] until 7pm After 7pm, Please call E-link [phone]

## 2023-06-21 NOTE — Progress Notes (Signed)
eLink Physician-Brief Progress Note Patient Name: Ricky Johnston. DOB: 1951/08/16 MRN: [id number]   Date of Service  06/21/2023  HPI/Events of Note  Intertrigo of the pannus and groin  eICU Interventions  Nystatin powder twice daily     Intervention Category Minor Interventions: Routine modifications to care plan (e.g. PRN medications for pain, fever)  Ricky Johnston 06/21/2023, 11:23 PM

## 2023-06-21 NOTE — Plan of Care (Signed)

## 2023-06-22 DIAGNOSIS — J9622 Acute and chronic respiratory failure with hypercapnia: Secondary | ICD-10-CM | POA: Diagnosis not present

## 2023-06-22 DIAGNOSIS — R579 Shock, unspecified: Secondary | ICD-10-CM | POA: Diagnosis not present

## 2023-06-22 DIAGNOSIS — J9621 Acute and chronic respiratory failure with hypoxia: Secondary | ICD-10-CM | POA: Diagnosis not present

## 2023-06-22 LAB — GLUCOSE, CAPILLARY
Glucose-Capillary: 127 mg/dL — ABNORMAL HIGH (ref 70–99)
Glucose-Capillary: 151 mg/dL — ABNORMAL HIGH (ref 70–99)
Glucose-Capillary: 176 mg/dL — ABNORMAL HIGH (ref 70–99)
Glucose-Capillary: 195 mg/dL — ABNORMAL HIGH (ref 70–99)
Glucose-Capillary: 195 mg/dL — ABNORMAL HIGH (ref 70–99)
Glucose-Capillary: 236 mg/dL — ABNORMAL HIGH (ref 70–99)

## 2023-06-22 LAB — BASIC METABOLIC PANEL
Anion gap: 12 (ref 5–15)
BUN: 39 mg/dL — ABNORMAL HIGH (ref 8–23)
CO2: 35 mmol/L — ABNORMAL HIGH (ref 22–32)
Calcium: 8.9 mg/dL (ref 8.9–10.3)
Chloride: 98 mmol/L (ref 98–111)
Creatinine, Ser: 1.43 mg/dL — ABNORMAL HIGH (ref 0.61–1.24)
GFR, Estimated: 52 mL/min — ABNORMAL LOW (ref >60)
Glucose, Bld: 228 mg/dL — ABNORMAL HIGH (ref 70–99)
Potassium: 4.1 mmol/L (ref 3.5–5.1)
Sodium: 145 mmol/L (ref 135–145)

## 2023-06-22 LAB — CULTURE, BLOOD (ROUTINE X 2): Culture: NO GROWTH

## 2023-06-22 MED ORDER — DOCUSATE SODIUM 50 MG/5ML PO LIQD: 100.0000 mg | Freq: Two times a day (BID) | ORAL | Status: DC | PRN

## 2023-06-22 MED ORDER — LACTULOSE 10 GM/15ML PO SOLN: 20.0000 g | Freq: Two times a day (BID) | ORAL | Status: DC

## 2023-06-22 MED ORDER — LACTULOSE 10 GM/15ML PO SOLN
20.0000 g | Freq: Two times a day (BID) | ORAL | Status: AC
  Filled 2023-06-22: qty 30

## 2023-06-22 MED ORDER — ORAL CARE MOUTH RINSE: 15.0000 mL | OROMUCOSAL | Status: DC | PRN

## 2023-06-22 MED ORDER — SPIRONOLACTONE 12.5 MG HALF TABLET
12.5000 mg | ORAL_TABLET | Freq: Every day | ORAL | Status: DC
  Administered 2023-06-23 – 2023-06-25 (×3): 12.5 mg via ORAL
  Filled 2023-06-22 (×3): qty 1

## 2023-06-22 MED ORDER — APIXABAN 5 MG PO TABS
5.0000 mg | ORAL_TABLET | Freq: Two times a day (BID) | ORAL | Status: DC
  Administered 2023-06-23 – 2023-06-25 (×5): 5 mg via ORAL
  Filled 2023-06-22 (×5): qty 1

## 2023-06-22 MED ORDER — ACETAMINOPHEN 325 MG PO TABS: 650.0000 mg | ORAL_TABLET | Freq: Four times a day (QID) | ORAL | Status: DC | PRN

## 2023-06-22 MED ORDER — LOSARTAN POTASSIUM 25 MG PO TABS
25.0000 mg | ORAL_TABLET | Freq: Every day | ORAL | Status: DC
  Administered 2023-06-23 – 2023-06-24 (×2): 25 mg via ORAL
  Filled 2023-06-22 (×3): qty 1

## 2023-06-22 MED ORDER — DOCUSATE SODIUM 50 MG/5ML PO LIQD
100.0000 mg | Freq: Two times a day (BID) | ORAL | Status: DC
  Administered 2023-06-23 – 2023-06-24 (×3): 100 mg via ORAL
  Filled 2023-06-22 (×5): qty 10

## 2023-06-22 MED ORDER — POLYETHYLENE GLYCOL 3350 17 G PO PACK: 17.0000 g | PACK | Freq: Every day | ORAL | Status: DC | PRN

## 2023-06-22 MED ORDER — LACTULOSE 10 GM/15ML PO SOLN
20.0000 g | Freq: Two times a day (BID) | ORAL | Status: DC
  Administered 2023-06-22: 20 g via ORAL
  Filled 2023-06-22: qty 30

## 2023-06-22 NOTE — Progress Notes (Signed)
PT placed on home BIPAP for the night      06/22/23 2200  BiPAP/CPAP/SIPAP  $ Non-Invasive Home Ventilator  Subsequent  BiPAP/CPAP/SIPAP Pt Type Adult  BiPAP/CPAP/SIPAP [name]  Mask Type Nasal mask;Nasal pillows  Mask Size Medium  IPAP 11 cmH20  EPAP 8 cmH2O  Patient Home Equipment Yes  Auto Titrate No  Press High Alarm 45 cmH2O  Press Low Alarm 2 cmH2O  Nasal massage performed Yes  BiPAP/CPAP /SiPAP Vitals  Temp 99.1 F (37.3 C)  Pulse Rate 64  Resp (!) 21  BP 100/60  SpO2 90 %  MEWS Score/Color  MEWS Score 2  MEWS Score Color Yellow

## 2023-06-22 NOTE — Progress Notes (Addendum)
NAME:  Ricky Seymour., MRN:  [id number], DOB:  05/28/1951, LOS: 3 ADMISSION DATE:  06/18/2023, CONSULTATION DATE:  7/22 REFERRING MD:  Dr Preston Fleeting EDP, CHIEF COMPLAINT:  hypotension   History of Present Illness:  Patient is encephalopathic and/or intubated; therefore, history has been obtained from chart review.  72 year old male with past medical history as below, which is significant for hypertension, hyperlipidemia, CHF, CAD, COPD, chronic respiratory failure on oxygen, sleep apnea on CPAP, and diabetes.  He was recently admitted to [facility] twice.  The first on June 8 for syncope which was ultimately felt to be vasovagal in nature.  He was again admitted on 6/13 in respiratory distress and was treated for COPD and CHF exacerbations.  He was discharged to SNF with a new oxygen requirement.  He was discharged from SNF on 7/20 and on 7/21 he again presented to [facility] emergency department with weakness and altered sensorium.  Upon arrival to the emergency department he was noted to be hypotensive which initially responded to IV fluids, however, he ultimately required norepinephrine infusion.  Lactic acid was normal, however.  He did have increased work of breathing for which she was started on BiPAP.  Chest x-ray was unremarkable.  ABG showed very mild respiratory acidosis and the patient was intubated.  He was transferred to [facility] for ICU admission.  Pertinent  Medical History   has a past medical history of Arthritis, Cancer (HCC), CHF (congestive heart failure) (HCC), Complication of anesthesia, COPD (chronic obstructive pulmonary disease) (HCC), Coronary artery disease, Diabetes mellitus without complication (HCC), DVT (deep venous thrombosis) (HCC) (01/08/2014, 2010), GERD (gastroesophageal reflux disease), Hallux limitus (05/18/2015), Headache, History of kidney stones, Hypercholesterolemia, Hypertension, Impaired hearing, Intervertebral disc syndrome, Kidney stones,  Pulmonary fibrosis (HCC), Renal calculi (01/08/2014), Renal insufficiency (06/19/2023), Restless legs (01/08/2014), Sciatic leg pain, Sleep apnea, and Tinnitus.   Significant Hospital Events: Including procedures, antibiotic start and stop dates in addition to other pertinent events   6/8 admit for syncope felt to be vasovagal 6/18 admit for CHF exacerbation, dc to SNF.  7/21 admit for hypotension, respiratory failure.   Interim History / Subjective:  Patient had TEE yesterday which showed EF 50-55% with no wall motion abnormalities Troponin remained flat, no EKG changes He remained off vasopressor support Tolerating spontaneous breathing trial this morning, will try to extubate him   Objective   Blood pressure (!) 110/59, pulse 65, temperature 99.5 F (37.5 C), resp. rate 20, height 5\' 8"  (1.727 m), weight 119.9 kg, SpO2 94%.    Vent Mode: PSV;CPAP FiO2 (%):  [40 %] 40 % Set Rate:  [20 bmp] 20 bmp Vt Set:  [540 mL] 540 mL PEEP:  [5 cmH20] 5 cmH20 Pressure Support:  [12 cmH20] 12 cmH20 Plateau Pressure:  [15 cmH20-23 cmH20] 23 cmH20   Intake/Output Summary (Last 24 hours) at 06/22/2023 0901 Last data filed at 06/22/2023 0841 Gross per 24 hour  Intake 836.43 ml  Output 3780 ml  Net -2943.57 ml   Filed Weights   06/18/23 2220 06/19/23 0505 06/22/23 0337  Weight: 117 kg 119.9 kg 119.9 kg    Examination: General: Elderly morbidly obese male, orally intubated HEENT: /AT, eyes anicteric.  ETT and OGT in place Neuro: Opens eyes with vocal stimuli, following simple commands, moving all 4 extremities Chest: Coarse breath sounds, no wheezes or rhonchi Heart: Regular rate and rhythm, no murmurs or gallops Abdomen: Soft, nondistended, bowel sounds present Skin: No rash  Labs and images  were reviewed  Resolved Hospital Problem list   Shock, undifferentiated Acute NSTEMI was ruled out with negative troponin and no EKG changes  Assessment & Plan:  Acute UTI Remained  afebrile Continue IV ceftriaxone to complete 5 days therapy   Acute on chronic chronic HFpEF with new diagnosis of HFrEF Advanced heart failure team is following Echocardiogram showed EF 35 to 40% which is decreased from prior with wall motion abnormalities EEG was done yesterday which showed ejection fraction 55% with no wall motion abnormalities Troponin remained flat, no EKG changes Continue to diuresis with Lasix Continue spironolactone Continue losartan  Acute on chronic hypoxic/hypercapnic respiratory failure Continue lung protective ventilation PAD protocol with fentanyl Tolerating supportive breathing trial, will try to extubate him today  COPD without acute exacerbation Continue Duonebs as needed and Budesonide  CKD stage IIIa Serum Cr is stable around 1.4 Monitor intake and output Avoid nephrotoxic agent AKI has been ruled out  Otitis externa  Continue ofloxacin drops to both ears daily  Diabetes type 2 with hypoglycemia Monitor fingerstick  Morbid obesity Diet and exercise counseling provided  Best Practice (right click and "Reselect all SmartList Selections" daily)   Diet/type: NPO bedside swallow evaluation DVT prophylaxis: prophylactic heparin  GI prophylaxis: PPI Lines: N/A Foley:  Yes, and it is still needed Code Status:  full code Last date of multidisciplinary goals of care discussion [7/24: Patient's wife was updated at bedside, decision was to continue full scope of care]  Labs   CBC: Recent Labs  Lab 06/18/23 2230 06/19/23 0633 06/20/23 0415 06/21/23 0620  WBC 5.5 5.4 6.0 4.5  NEUTROABS 4.5  --   --   --   HGB 11.7* 10.9* 10.8* 10.3*  HCT 39.6 37.7* 35.4* 34.0*  MCV 98.8 96.9 98.6 96.0  PLT 164 166 154 166    Basic Metabolic Panel: Recent Labs  Lab 06/18/23 2230 06/19/23 0633 06/20/23 0415 06/20/23 1607 06/21/23 0620 06/21/23 1811  NA 138 139 140  --  141  --   K 4.2 3.8 4.4  --  4.1  --   CL 97* 103 99  --  99  --   CO2  32 29 31  --  32  --   GLUCOSE 217* 109* 82  --  176*  --   BUN 29* 24* 25*  --  33*  --   CREATININE 1.64* 1.54* 1.44*  --  1.44*  --   CALCIUM 8.3* 8.0* 8.1*  --  8.4*  --   MG  --  2.0 2.0 1.9 2.1 2.3  PHOS  --  2.8 3.4 4.4 3.4 2.9   GFR: Estimated Creatinine Clearance: 58.4 mL/min (A) (by C-G formula based on SCr of 1.44 mg/dL (H)). Recent Labs  Lab 06/18/23 2230 06/19/23 0017 06/19/23 0633 06/20/23 0415 06/21/23 0620  PROCALCITON  --   --  0.46  --   --   WBC 5.5  --  5.4 6.0 4.5  LATICACIDVEN 1.4 1.6  --   --   --     Liver Function Tests: Recent Labs  Lab 06/18/23 2230 06/19/23 0633  AST 17 14*  ALT 14 12  ALKPHOS 79 70  BILITOT 0.7 0.6  PROT 7.3 6.2*  ALBUMIN 3.0* 2.5*   No results for input(s): "LIPASE", "AMYLASE" in the last 168 hours. No results for input(s): "AMMONIA" in the last 168 hours.  ABG    Component Value Date/Time   PHART 7.38 06/19/2023 0238   PCO2ART 62 (H)  06/19/2023 0238   PO2ART 104 06/19/2023 0238   HCO3 36.7 (H) 06/19/2023 0238   TCO2 33 (H) 05/11/2023 0745   ACIDBASEDEF 0.8 02/10/2017 1740   O2SAT 98.9 06/19/2023 0238     Coagulation Profile: Recent Labs  Lab 06/18/23 2230 06/19/23 0633  INR 1.2 1.3*    Cardiac Enzymes: No results for input(s): "CKTOTAL", "CKMB", "CKMBINDEX", "TROPONINI" in the last 168 hours.  HbA1C: Hgb A1c MFr Bld  Date/Time Value Ref Range Status  05/11/2023 08:20 AM 8.6 (H) 4.8 - 5.6 % Final    Comment:    (NOTE) Pre diabetes:          5.7%-6.4%  Diabetes:              >6.4%  Glycemic control for   <7.0% adults with diabetes   05/06/2023 06:49 PM 8.6 (H) 4.8 - 5.6 % Final    Comment:    (NOTE) Pre diabetes:          5.7%-6.4%  Diabetes:              >6.4%  Glycemic control for   <7.0% adults with diabetes     CBG: Recent Labs  Lab 06/21/23 1541 06/21/23 2008 06/22/23 0035 06/22/23 0445 06/22/23 0741  GLUCAP 187* 156* 195* 176* 236*    The patient is critically ill due to  shock/acute on chronic respiratory failure with hypoxia and hypercapnia.  Critical care was necessary to treat or prevent imminent or life-threatening deterioration.  Critical care was time spent personally by me on the following activities: development of treatment plan with patient and/or surrogate as well as nursing, discussions with consultants, evaluation of patient's response to treatment, examination of patient, obtaining history from patient or surrogate, ordering and performing treatments and interventions, ordering and review of laboratory studies, ordering and review of radiographic studies, pulse oximetry, re-evaluation of patient's condition and participation in multidisciplinary rounds.   During this encounter critical care time was devoted to patient care services described in this note for 31 minutes.     Cheri Fowler, MD [facility] See Amion for pager If no response to pager, please call [phone] until 7pm After 7pm, Please call E-link [phone]

## 2023-06-22 NOTE — Procedures (Signed)
Extubation Procedure Note  Patient Details:   Name: Ricky MORRISS Sr. DOB: 1951-04-23 MRN: [id number]   Airway Documentation:    Vent end date: 06/22/23 Vent end time: 1001   Evaluation  O2 sats: stable throughout Complications: No apparent complications Patient did tolerate procedure well. Bilateral Breath Sounds: Clear, Diminished   Yes  Pt extubated w/o complication to 4 lpm. + cuff leak noted. No stridor. Pt tolerated well.  Leafy Ro 06/22/2023, 10:09 AM

## 2023-06-22 NOTE — Evaluation (Signed)
Clinical/Bedside Swallow Evaluation Patient Details  Name: Ricky BAHRI Sr. MRN: [id number] Date of Birth: 02/22/1951  Today's Date: 06/22/2023 Time: SLP Start Time (ACUTE ONLY): 1724 SLP Stop Time (ACUTE ONLY): 1741 SLP Time Calculation (min) (ACUTE ONLY): 17 min  Past Medical History:  Past Medical History:  Diagnosis Date   Arthritis    Cancer (HCC)    bladder cancer currently 2016   CHF (congestive heart failure) (HCC)    Complication of anesthesia    hard for him to wake up from Anesthesia from left nephrectomy   COPD (chronic obstructive pulmonary disease) (HCC)    Coronary artery disease    Diabetes mellitus without complication (HCC)    DVT (deep venous thrombosis) (HCC) 01/08/2014, 2010   upper extremity   GERD (gastroesophageal reflux disease)    Hallux limitus 05/18/2015   from notes from [facility]    Headache    migraines   History of kidney stones    Hypercholesterolemia    Hypertension    Impaired hearing    Intervertebral disc syndrome    Kidney stones    Pulmonary fibrosis (HCC)    Renal calculi 01/08/2014   frrom noted from [facility] .in chart   Renal insufficiency 06/19/2023   Restless legs 01/08/2014   Sciatic leg pain    paralysis of sciatic nerve   Sleep apnea    CPAP/BIPAP   Tinnitus    Past Surgical History:  Past Surgical History:  Procedure Laterality Date   ABDOMINAL SURGERY     BACK SURGERY     BLADDER REPAIR     EYE SURGERY     HERNIA REPAIR     LEFT HEART CATHETERIZATION WITH CORONARY ANGIOGRAM N/A 01/09/2015   Procedure: LEFT HEART CATHETERIZATION WITH CORONARY ANGIOGRAM;  Surgeon: Robynn Pane, MD;  Location: [facility] CATH LAB;  Service: Cardiovascular;  Laterality: N/A;   NEPHROURETERECTOMY  03/18/2016   with bladder cuff excision- from noted in chart from [facility]   TOTAL HIP ARTHROPLASTY Left 02/10/2017   Procedure: LEFT TOTAL HIP ARTHROPLASTY ANTERIOR APPROACH;  Surgeon: Kathryne Hitch, MD;  Location: [facility] ORS;  Service:  Orthopedics;  Laterality: Left;   HPI:  Pt is 72 year old presented to [facility] on  06/18/23 for hypotension, respiratory failure and weakness. Intubated 7/21 and extubated 7/25. PMH -  COPD, HTN, DM, bladder CA, UE DVT, CHF, obesity, THR    Assessment / Plan / Recommendation  Clinical Impression  Pt participated in clinical swallowing assessment.  Oral mechanism exam was normal.  Voice mildly hoarse but good volume and returning to normal per pt and his wife.  He demonstrated a mild post-extubation dysphagia with consistent coughing elicited after drinking thin liquids. Nectar thick liquids and purees were swallowed without incident and without concern for aspiration. Recommend initiating a full liquid diet thickened to nectar for this evening. SLP will f/u next date to determine potential for diet advancement. Pt and his wife agree with plan. D/W RN. SLP will follow. SLP Visit Diagnosis: Dysphagia, unspecified (R13.10)    Aspiration Risk  Mild aspiration risk    Diet Recommendation   Nectar;Other (Comment) (full liquids)  Medication Administration: Whole meds with puree    Other  Recommendations Oral Care Recommendations: Oral care BID    Recommendations for follow up therapy are one component of a multi-disciplinary discharge planning process, led by the attending physician.  Recommendations may be updated based on patient status, additional functional criteria and insurance authorization.  Follow  up Recommendations No SLP follow up      Assistance Recommended at Discharge    Functional Status Assessment    Frequency and Duration min 2x/week  1 week       Prognosis        Swallow Study   General Date of Onset: 06/18/23 HPI: Pt is 72 year old presented to [facility] on  06/18/23 for hypotension, respiratory failure and weakness. Intubated 7/21 and extubated 7/25. PMH -  COPD, HTN, DM, bladder CA, UE DVT, CHF, obesity, THR Type of Study: Bedside Swallow Evaluation Previous Swallow  Assessment: no Diet Prior to this Study: NPO Temperature Spikes Noted: No Respiratory Status: Nasal cannula History of Recent Intubation: Yes Total duration of intubation (days): 4 days Date extubated: 06/22/23 Behavior/Cognition: Alert;Cooperative;Pleasant mood Oral Cavity Assessment: Within Functional Limits Oral Care Completed by SLP: No Oral Cavity - Dentition: Dentures, top;Dentures, bottom Vision: Functional for self-feeding Self-Feeding Abilities: Able to feed self Patient Positioning: Upright in bed Baseline Vocal Quality: Normal Volitional Cough: Strong Volitional Swallow: Able to elicit    Oral/Motor/Sensory Function Overall Oral Motor/Sensory Function: Within functional limits   Ice Chips Ice chips: Within functional limits   Thin Liquid Thin Liquid: Impaired Pharyngeal  Phase Impairments: Cough - Immediate    Nectar Thick Nectar Thick Liquid: Within functional limits   Honey Thick Honey Thick Liquid: Not tested   Puree Puree: Within functional limits   Solid     Solid: Not tested      Ricky Johnston 06/22/2023,5:47 PM  Ricky Folks L. Samson Frederic, MA CCC/SLP Clinical Specialist - Acute Care SLP Acute Rehabilitation Services Office number [phone]

## 2023-06-22 NOTE — Evaluation (Signed)
Physical Therapy Evaluation Patient Details Name: Ricky Johnston. MRN: [id number] DOB: [birth year] Today's Date: 06/22/2023  History of Present Illness  Pt is 72 year old presented to [facility] on  06/18/23 for hypotension, respiratory failure and weakness. Intubated 7/21 and extubated 7/25. PMH -  COPD, HTN, DM, bladder CA, UE DVT, CHF, obesity, THR  Clinical Impression  Pt admitted with above diagnosis and presents to PT with functional limitations due to deficits listed below (See PT problem list). Pt needs skilled PT to maximize independence and safety. Pt had just returned home from month long stay at SNF. At SNF pt had been amb some but when returned home was too weak and unable to amb/stand. Wife would like hoyer lift at home to assist as needed. Hopefully will regain some mobility to decr burden of care.           Assistance Recommended at Discharge Frequent or constant Supervision/Assistance  If plan is discharge home, recommend the following:  Can travel by private vehicle  Two people to help with walking and/or transfers;Two people to help with bathing/dressing/bathroom;Direct supervision/assist for medications management;Assist for transportation;Help with stairs or ramp for entrance        Equipment Recommendations Other (comment) (hoyer lift)  Recommendations for Other Services       Functional Status Assessment Patient has had a recent decline in their functional status and demonstrates the ability to make significant improvements in function in a reasonable and predictable amount of time.     Precautions / Restrictions Precautions Precautions: Fall      Mobility  Bed Mobility Overal bed mobility: Needs Assistance Bed Mobility: Supine to Sit, Sit to Supine     Supine to sit: +2 for physical assistance, Max assist Sit to supine: +2 for physical assistance, Max assist   General bed mobility comments: Assit to bring feet off of bed, elevate trunk into sitting, and  bring hips to EOB. Assist to lower trunk and bring legs back up into bed.    Transfers                   General transfer comment: Pt refused to attempt to stand    Ambulation/Gait                  Stairs            Wheelchair Mobility     Tilt Bed    Modified Rankin (Stroke Patients Only)       Balance Overall balance assessment: Needs assistance Sitting-balance support: Bilateral upper extremity supported, Feet supported Sitting balance-Leahy Scale: Poor Sitting balance - Comments: UE support. Supervision to min assist for static sitting Postural control: Posterior lean                                   Pertinent Vitals/Pain Pain Assessment Pain Assessment: No/denies pain    Home Living Family/patient expects to be discharged to:: Private residence Living Arrangements: Spouse/significant other Available Help at Discharge: Family;Available 24 hours/day Type of Home: House Home Access: Ramped entrance       Home Layout: One level Home Equipment: Rollator (4 wheels);Cane - single point;Shower seat;Grab bars - toilet;Grab bars - tub/shower;Other (comment);Wheelchair - manual      Prior Function Prior Level of Function : Needs assist;History of Falls (last six months)       Physical Assist : Mobility (physical);ADLs (physical) Mobility (physical):  Bed mobility;Transfers;Gait   Mobility Comments: At rehab pt was amb some with assist       Hand Dominance        Extremity/Trunk Assessment   Upper Extremity Assessment Upper Extremity Assessment: Defer to OT evaluation    Lower Extremity Assessment Lower Extremity Assessment: Generalized weakness       Communication   Communication: No difficulties  Cognition Arousal/Alertness: Awake/alert Behavior During Therapy: Flat affect Overall Cognitive Status: Impaired/Different from baseline Area of Impairment: Attention, Following commands, Safety/judgement, Problem  solving                   Current Attention Level: Sustained   Following Commands: Follows one step commands with increased time, Follows one step commands inconsistently Safety/Judgement: Decreased awareness of safety   Problem Solving: Slow processing, Decreased initiation, Requires verbal cues, Requires tactile cues, Difficulty sequencing General Comments: Pt with multiple pauses when asked to do something or to answer question        General Comments General comments (skin integrity, edema, etc.): VSS on O2    Exercises General Exercises - Lower Extremity Long Arc Quad: AROM, Both, 5 reps, Seated   Assessment/Plan    PT Assessment Patient needs continued PT services  PT Problem List Decreased strength;Decreased activity tolerance;Decreased mobility;Decreased balance;Obesity       PT Treatment Interventions DME instruction;Gait training;Functional mobility training;Therapeutic activities;Therapeutic exercise;Balance training;Patient/family education    PT Goals (Current goals can be found in the Care Plan section)  Acute Rehab PT Goals Patient Stated Goal: not stated PT Goal Formulation: With patient/family Time For Goal Achievement: 07/06/23 Potential to Achieve Goals: Fair    Frequency Min 1X/week     Co-evaluation               AM-PAC PT "6 Clicks" Mobility  Outcome Measure Help needed turning from your back to your side while in a flat bed without using bedrails?: A Lot Help needed moving from lying on your back to sitting on the side of a flat bed without using bedrails?: Total Help needed moving to and from a bed to a chair (including a wheelchair)?: Total Help needed standing up from a chair using your arms (e.g., wheelchair or bedside chair)?: Total Help needed to walk in hospital room?: Total Help needed climbing 3-5 steps with a railing? : Total 6 Click Score: 7    End of Session Equipment Utilized During Treatment: Oxygen Activity  Tolerance: Patient limited by fatigue Patient left: in bed;with call bell/phone within reach;with bed alarm set;with family/visitor present Nurse Communication: Mobility status PT Visit Diagnosis: Other abnormalities of gait and mobility (R26.89);Muscle weakness (generalized) (M62.81);History of falling (Z91.81)    Time: 1350-1416 PT Time Calculation (min) (ACUTE ONLY): 26 min   Charges:   PT Evaluation $PT Eval Moderate Complexity: 1 Mod PT Treatments $Therapeutic Activity: 8-22 mins PT General Charges $$ ACUTE PT VISIT: 1 Visit         [facility] PT Acute Rehabilitation Services Office [phone]   Angelina Ok [facility] 06/22/2023, 2:39 PM

## 2023-06-22 NOTE — Plan of Care (Signed)
?  Problem: Education: ?Goal: Knowledge of General Education information will improve ?Description: Including pain rating scale, medication(s)/side effects and non-pharmacologic comfort measures ?Outcome: Progressing ?  ?Problem: Health Behavior/Discharge Planning: ?Goal: Ability to manage health-related needs will improve ?Outcome: Progressing ?  ?Problem: Clinical Measurements: ?Goal: Ability to maintain clinical measurements within normal limits will improve ?Outcome: Progressing ?Goal: Will remain free from infection ?Outcome: Progressing ?Goal: Diagnostic test results will improve ?Outcome: Progressing ?Goal: Respiratory complications will improve ?Outcome: Progressing ?Goal: Cardiovascular complication will be avoided ?Outcome: Progressing ?  ?Problem: Activity: ?Goal: Risk for activity intolerance will decrease ?Outcome: Progressing ?  ?Problem: Nutrition: ?Goal: Adequate nutrition will be maintained ?Outcome: Progressing ?  ?Problem: Coping: ?Goal: Level of anxiety will decrease ?Outcome: Progressing ?  ?Problem: Elimination: ?Goal: Will not experience complications related to bowel motility ?Outcome: Progressing ?Goal: Will not experience complications related to urinary retention ?Outcome: Progressing ?  ?Problem: Pain Managment: ?Goal: General experience of comfort will improve ?Outcome: Progressing ?  ?Problem: Safety: ?Goal: Ability to remain free from injury will improve ?Outcome: Progressing ?  ?Problem: Skin Integrity: ?Goal: Risk for impaired skin integrity will decrease ?Outcome: Progressing ?  ?Problem: Safety: ?Goal: Non-violent Restraint(s) ?Outcome: Completed/Met ?  ?

## 2023-06-23 DIAGNOSIS — J9622 Acute and chronic respiratory failure with hypercapnia: Secondary | ICD-10-CM | POA: Diagnosis not present

## 2023-06-23 DIAGNOSIS — J9621 Acute and chronic respiratory failure with hypoxia: Secondary | ICD-10-CM | POA: Diagnosis not present

## 2023-06-23 DIAGNOSIS — R579 Shock, unspecified: Secondary | ICD-10-CM | POA: Diagnosis not present

## 2023-06-23 LAB — GLUCOSE, CAPILLARY
Glucose-Capillary: 143 mg/dL — ABNORMAL HIGH (ref 70–99)
Glucose-Capillary: 169 mg/dL — ABNORMAL HIGH (ref 70–99)
Glucose-Capillary: 178 mg/dL — ABNORMAL HIGH (ref 70–99)
Glucose-Capillary: 235 mg/dL — ABNORMAL HIGH (ref 70–99)
Glucose-Capillary: 214 mg/dL — ABNORMAL HIGH (ref 70–99)

## 2023-06-23 MED ORDER — POLYETHYLENE GLYCOL 3350 17 G PO PACK
17.0000 g | PACK | Freq: Every day | ORAL | Status: DC
  Administered 2023-06-24: 17 g via ORAL
  Filled 2023-06-23 (×3): qty 1

## 2023-06-23 NOTE — Progress Notes (Signed)
Physical Therapy Treatment Patient Details Name: Ricky ABBITT Sr. MRN: [id number] DOB: May 05, 1951 Today's Date: 06/23/2023   History of Present Illness Pt is 72 year old presented to [facility] on  06/18/23 for hypotension, respiratory failure and weakness. Intubated 7/21 and extubated 7/25. PMH -  COPD, HTN, DM, bladder CA, UE DVT, CHF, obesity, THR    PT Comments  Pt making steady progress. Was able to stand with [facility] and get to chair with Stedy. Will need to continue to improve to decrease the burden of care for his family to manage him at home.     Assistance Recommended at Discharge Frequent or constant Supervision/Assistance  If plan is discharge home, recommend the following:  Can travel by private vehicle    Two people to help with walking and/or transfers;Two people to help with bathing/dressing/bathroom;Direct supervision/assist for medications management;Assist for transportation;Help with stairs or ramp for entrance      Equipment Recommendations  Other (comment) (hoyer lift)    Recommendations for Other Services       Precautions / Restrictions Precautions Precautions: Fall     Mobility  Bed Mobility Overal bed mobility: Needs Assistance Bed Mobility: Supine to Sit     Supine to sit: Mod assist, +2 for safety/equipment     General bed mobility comments: Assit to bring feet off of bed, elevate trunk into sitting, and bring hips to EOB.    Transfers Overall transfer level: Needs assistance Equipment used: Ambulation equipment used Transfers: Sit to/from Stand, Bed to chair/wheelchair/BSC Sit to Stand: +2 physical assistance, Mod assist, From elevated surface           General transfer comment: Assist to power up and stabilize. Stood with Antony Salmon from bed and used Stedy for bed to Materials engineer: Stedy  Ambulation/Gait             Pre-gait activities: Stood with [employer] x 30 sec     Stairs             Marketing executive Bed    Modified Rankin (Stroke Patients Only)       Balance Overall balance assessment: Needs assistance Sitting-balance support: Bilateral upper extremity supported, Feet supported Sitting balance-Leahy Scale: Poor Sitting balance - Comments: UE support.   Standing balance support: Bilateral upper extremity supported, During functional activity, Reliant on assistive device for balance Standing balance-Leahy Scale: Poor Standing balance comment: Stedy and min guard for static standing                            Cognition Arousal/Alertness: Awake/alert Behavior During Therapy: Flat affect Overall Cognitive Status: Impaired/Different from baseline Area of Impairment: Attention, Following commands, Problem solving                   Current Attention Level: Selective   Following Commands: Follows one step commands with increased time     Problem Solving: Slow processing, Decreased initiation, Requires verbal cues, Requires tactile cues          Exercises      General Comments General comments (skin integrity, edema, etc.): VSS      Pertinent Vitals/Pain Pain Assessment Pain Assessment: No/denies pain    Home Living                          Prior Function  PT Goals (current goals can now be found in the care plan section) Acute Rehab PT Goals Patient Stated Goal: not stated Progress towards PT goals: Progressing toward goals    Frequency    Min 1X/week      PT Plan Current plan remains appropriate    Co-evaluation              AM-PAC PT "6 Clicks" Mobility   Outcome Measure  Help needed turning from your back to your side while in a flat bed without using bedrails?: A Lot Help needed moving from lying on your back to sitting on the side of a flat bed without using bedrails?: A Lot Help needed moving to and from a bed to a chair (including a wheelchair)?: Total Help needed standing up from a  chair using your arms (e.g., wheelchair or bedside chair)?: Total Help needed to walk in hospital room?: Total Help needed climbing 3-5 steps with a railing? : Total 6 Click Score: 8    End of Session Equipment Utilized During Treatment: Oxygen Activity Tolerance: Patient tolerated treatment well Patient left: with call bell/phone within reach;in chair;with chair alarm set Nurse Communication: Mobility status;Need for lift equipment (Nurse assisted with transfer) PT Visit Diagnosis: Other abnormalities of gait and mobility (R26.89);Muscle weakness (generalized) (M62.81);History of falling (Z91.81)     Time: 5643-3295 PT Time Calculation (min) (ACUTE ONLY): 25 min  Charges:    $Therapeutic Activity: 23-37 mins PT General Charges $$ ACUTE PT VISIT: 1 Visit                     [facility] PT Acute Rehabilitation Services Office [phone]    Angelina [facility] 06/23/2023, 6:41 PM

## 2023-06-23 NOTE — Progress Notes (Signed)
Pt placed on CPAP    06/23/23 2111  BiPAP/CPAP/SIPAP  $ Non-Invasive Home Ventilator  Subsequent  BiPAP/CPAP/SIPAP Pt Type Adult  BiPAP/CPAP/SIPAP [name]  Mask Type Nasal pillows  Mask Size Medium  Respiratory Rate 16 breaths/min  EPAP 8 cmH2O  PEEP 8 cmH20  FiO2 (%) 44 %  Patient Home Equipment Yes  Press High Alarm 45 cmH2O  Press Low Alarm 2 cmH2O  Nasal massage performed Yes  CPAP/SIPAP surface wiped down Yes  BiPAP/CPAP /SiPAP Vitals  BP (!) 105/45  MEWS Score/Color  MEWS Score 2  MEWS Score Color Yellow

## 2023-06-23 NOTE — Plan of Care (Signed)
  Problem: Education: Goal: Knowledge of General Education information will improve Description Including pain rating scale, medication(s)/side effects and non-pharmacologic comfort measures Outcome: Progressing   Problem: Health Behavior/Discharge Planning: Goal: Ability to manage health-related needs will improve Outcome: Progressing   Problem: Clinical Measurements: Goal: Ability to maintain clinical measurements within normal limits will improve Outcome: Progressing Goal: Will remain free from infection Outcome: Progressing Goal: Diagnostic test results will improve Outcome: Progressing Goal: Respiratory complications will improve Outcome: Progressing Goal: Cardiovascular complication will be avoided Outcome: Progressing   Problem: Activity: Goal: Risk for activity intolerance will decrease Outcome: Progressing   Problem: Nutrition: Goal: Adequate nutrition will be maintained Outcome: Progressing   Problem: Coping: Goal: Level of anxiety will decrease Outcome: Progressing   Problem: Elimination: Goal: Will not experience complications related to bowel motility Outcome: Progressing Goal: Will not experience complications related to urinary retention Outcome: Progressing   Problem: Pain Managment: Goal: General experience of comfort will improve Outcome: Progressing   Problem: Safety: Goal: Ability to remain free from injury will improve Outcome: Progressing   Problem: Skin Integrity: Goal: Risk for impaired skin integrity will decrease Outcome: Progressing   Problem: Respiratory: Goal: Ability to maintain a clear airway will improve Outcome: Progressing Goal: Levels of oxygenation will improve Outcome: Progressing Goal: Ability to maintain adequate ventilation will improve Outcome: Progressing   

## 2023-06-23 NOTE — Progress Notes (Signed)
NAME:  Ricky Dyck., MRN:  [id number], DOB:  01-Dec-1950, LOS: 4 ADMISSION DATE:  06/18/2023, CONSULTATION DATE:  7/22 REFERRING MD:  Dr Preston Fleeting EDP, CHIEF COMPLAINT:  hypotension   History of Present Illness:  72 year old male with past medical history as below, which is significant for hypertension, hyperlipidemia, CHF, CAD, COPD, chronic respiratory failure on oxygen, sleep apnea on CPAP, and diabetes.  He was recently admitted to [facility] twice.  The first on June 8 for syncope which was ultimately felt to be vasovagal in nature.  He was again admitted on 6/13 in respiratory distress and was treated for COPD and CHF exacerbations.  He was discharged to SNF with a new oxygen requirement.  He was discharged from SNF on 7/20 and on 7/21 he again presented to [facility] emergency department with weakness and altered sensorium.  Upon arrival to the emergency department he was noted to be hypotensive which initially responded to IV fluids, however, he ultimately required norepinephrine infusion.  Lactic acid was normal, however.  He did have increased work of breathing for which she was started on BiPAP.  Chest x-ray was unremarkable.  ABG showed very mild respiratory acidosis and the patient was intubated.  He was transferred to [facility] for ICU admission.  Pertinent  Medical History   has a past medical history of Arthritis, Cancer (HCC), CHF (congestive heart failure) (HCC), Complication of anesthesia, COPD (chronic obstructive pulmonary disease) (HCC), Coronary artery disease, Diabetes mellitus without complication (HCC), DVT (deep venous thrombosis) (HCC) (01/08/2014, 2010), GERD (gastroesophageal reflux disease), Hallux limitus (05/18/2015), Headache, History of kidney stones, Hypercholesterolemia, Hypertension, Impaired hearing, Intervertebral disc syndrome, Kidney stones, Pulmonary fibrosis (HCC), Renal calculi (01/08/2014), Renal insufficiency (06/19/2023), Restless legs  (01/08/2014), Sciatic leg pain, Sleep apnea, and Tinnitus.   Significant Hospital Events: Including procedures, antibiotic start and stop dates in addition to other pertinent events   6/8 admit for syncope felt to be vasovagal 6/18 admit for CHF exacerbation, dc to SNF.  7/21 admit for hypotension, respiratory failure.   Interim History / Subjective:  Patient was successfully extubated yesterday Remain on nasal cannula oxygen, will order BiPAP overnight which he uses at home Stated feeling much better  Objective   Blood pressure (!) 121/57, pulse (!) 48, temperature 99 F (37.2 C), resp. rate 19, height 5\' 8"  (1.727 m), weight 111.6 kg, SpO2 91%.        Intake/Output Summary (Last 24 hours) at 06/23/2023 4098 Last data filed at 06/23/2023 0800 Gross per 24 hour  Intake 236.96 ml  Output 2010 ml  Net -1773.04 ml   Filed Weights   06/19/23 0505 06/22/23 0337 06/23/23 0500  Weight: 119.9 kg 119.9 kg 111.6 kg    Examination: Physical exam: General: Elderly morbidly obese male, lying on the bed HEENT: [place]/AT, eyes anicteric.  moist mucus membranes Neuro: Alert, awake following commands Chest: Coarse breath sounds, no wheezes or rhonchi Heart: Regular rate and rhythm, no murmurs or gallops Abdomen: Soft, nontender, nondistended, bowel sounds present Skin: No rash  Labs and images were reviewed TEE:  EF 50-55% with no wall motion abnormalities  Resolved Hospital Problem list   Shock, undifferentiated Acute NSTEMI was ruled out with negative troponin and no EKG changes  Assessment & Plan:  Acute UTI Remained afebrile Continue IV ceftriaxone to complete 5 days therapy  Acute on chronic chronic HFpEF  Acute HFrEF was ruled out with normal TEE Advanced heart failure team input is appreciated, TEE ruled out  acute HFrEF Ejection fraction is 55% with no wall motion abnormalities Continue gentle diuresis Continue spironolactone Continue losartan  Acute on chronic  hypoxic/hypercapnic respiratory failure Patient was successfully debated yesterday Encourage incentive spirometry Titrate FiO2 with O2 sat goal 92%  COPD without acute exacerbation Continue Duonebs as needed and Budesonide  CKD stage IIIa Serum Cr is stable around 1.4 Monitor intake and output Avoid nephrotoxic agent AKI has been ruled out  Otitis externa  Continue ofloxacin drops to both ears daily  Diabetes type 2 with hypoglycemia Monitor fingerstick  Morbid obesity Diet and exercise counseling provided  Best Practice (right click and "Reselect all SmartList Selections" daily)   Diet/type: Nectar thick DVT prophylaxis: prophylactic heparin  GI prophylaxis: PPI Lines: N/A Foley:  DC foley Code Status:  full code Last date of multidisciplinary goals of care discussion [7/24: Patient's wife was updated at bedside, decision was to continue full scope of care]  Labs   CBC: Recent Labs  Lab 06/18/23 2230 06/19/23 0633 06/20/23 0415 06/21/23 0620  WBC 5.5 5.4 6.0 4.5  NEUTROABS 4.5  --   --   --   HGB 11.7* 10.9* 10.8* 10.3*  HCT 39.6 37.7* 35.4* 34.0*  MCV 98.8 96.9 98.6 96.0  PLT 164 166 154 166    Basic Metabolic Panel: Recent Labs  Lab 06/18/23 2230 06/19/23 0633 06/20/23 0415 06/20/23 1607 06/21/23 0620 06/21/23 1811 06/22/23 0908  NA 138 139 140  --  141  --  145  K 4.2 3.8 4.4  --  4.1  --  4.1  CL 97* 103 99  --  99  --  98  CO2 32 29 31  --  32  --  35*  GLUCOSE 217* 109* 82  --  176*  --  228*  BUN 29* 24* 25*  --  33*  --  39*  CREATININE 1.64* 1.54* 1.44*  --  1.44*  --  1.43*  CALCIUM 8.3* 8.0* 8.1*  --  8.4*  --  8.9  MG  --  2.0 2.0 1.9 2.1 2.3  --   PHOS  --  2.8 3.4 4.4 3.4 2.9  --    GFR: Estimated Creatinine Clearance: 56.6 mL/min (A) (by C-G formula based on SCr of 1.43 mg/dL (H)). Recent Labs  Lab 06/18/23 2230 06/19/23 0017 06/19/23 0633 06/20/23 0415 06/21/23 0620  PROCALCITON  --   --  0.46  --   --   WBC 5.5  --  5.4  6.0 4.5  LATICACIDVEN 1.4 1.6  --   --   --     Liver Function Tests: Recent Labs  Lab 06/18/23 2230 06/19/23 0633  AST 17 14*  ALT 14 12  ALKPHOS 79 70  BILITOT 0.7 0.6  PROT 7.3 6.2*  ALBUMIN 3.0* 2.5*   No results for input(s): "LIPASE", "AMYLASE" in the last 168 hours. No results for input(s): "AMMONIA" in the last 168 hours.  ABG    Component Value Date/Time   PHART 7.38 06/19/2023 0238   PCO2ART 62 (H) 06/19/2023 0238   PO2ART 104 06/19/2023 0238   HCO3 36.7 (H) 06/19/2023 0238   TCO2 33 (H) 05/11/2023 0745   ACIDBASEDEF 0.8 02/10/2017 1740   O2SAT 98.9 06/19/2023 0238     Coagulation Profile: Recent Labs  Lab 06/18/23 2230 06/19/23 0633  INR 1.2 1.3*    Cardiac Enzymes: No results for input(s): "CKTOTAL", "CKMB", "CKMBINDEX", "TROPONINI" in the last 168 hours.  HbA1C: Hgb A1c MFr Bld  Date/Time Value Ref Range Status  05/11/2023 08:20 AM 8.6 (H) 4.8 - 5.6 % Final    Comment:    (NOTE) Pre diabetes:          5.7%-6.4%  Diabetes:              >6.4%  Glycemic control for   <7.0% adults with diabetes   05/06/2023 06:49 PM 8.6 (H) 4.8 - 5.6 % Final    Comment:    (NOTE) Pre diabetes:          5.7%-6.4%  Diabetes:              >6.4%  Glycemic control for   <7.0% adults with diabetes     CBG: Recent Labs  Lab 06/22/23 1519 06/22/23 1955 06/22/23 2352 06/23/23 0355 06/23/23 0749  GLUCAP 148* 151* 127* 143* 169*     Cheri Fowler, MD [facility] See Amion for pager If no response to pager, please call [phone] until 7pm After 7pm, Please call E-link [phone]

## 2023-06-23 NOTE — Progress Notes (Signed)
Speech Language Pathology Treatment: Dysphagia  Patient Details Name: AKEEN BEH Sr. MRN: [id number] DOB: 1950-12-06 Today's Date: 06/23/2023 Time: 2952-8413 SLP Time Calculation (min) (ACUTE ONLY): 33 min  Assessment / Plan / Recommendation Clinical Impression  Pt demonstrated improved toleration of thin liquids today. When he drank sequential boluses in combination with regular solids, coughing occurred, but coughing was eliminated when he drank controlled/single sips and swallowed before placing solids in his mouth.  Voice is improved from time of evaluation last night. Pt was able to follow commands, feed himself, and showed adequate awareness and recall of instructions. Recommend advancing diet to regular solids, thin liquids - small sips/bites, slow rate.  D/W RN.  SLP will f/u x1 to ensure safety/toleration.    HPI HPI: Pt is 72 year old presented to [facility] on  06/18/23 for hypotension, respiratory failure and weakness. Intubated 7/21 and extubated 7/25. PMH -  COPD, HTN, DM, bladder CA, UE DVT, CHF, obesity, THR      SLP Plan  Continue with current plan of care      Recommendations for follow up therapy are one component of a multi-disciplinary discharge planning process, led by the attending physician.  Recommendations may be updated based on patient status, additional functional criteria and insurance authorization.    Recommendations  Diet recommendations: Regular;Thin liquid Liquids provided via: Cup;Straw Medication Administration: Whole meds with puree Supervision: Patient able to self feed Compensations: Small sips/bites                  Oral care BID     Dysphagia, unspecified (R13.10)     Continue with current plan of care    .acs Blenda Mounts Laurice  06/23/2023, 10:02 AM

## 2023-06-23 NOTE — Progress Notes (Incomplete)
{  CHL NUTRITION NOTE EAVW:09811}  DOCUMENTATION CODES:   Obesity unspecified  INTERVENTION:  ***   NUTRITION DIAGNOSIS:   Inadequate oral intake related to acute illness as evidenced by NPO status.  ***  GOAL:   Patient will meet greater than or equal to 90% of their needs  ***  MONITOR:   Vent status, Labs, Weight trends, TF tolerance, I & O's  REASON FOR ASSESSMENT:   Consult, Ventilator Enteral/tube feeding initiation and management  ASSESSMENT:   72 yo male admitted with acute on chronic respiratory failure requiring intubation, shock, HFpEF with newly reduced EF. PMH includes HFpEF, COPD, chronic respiratory failure on oxygen at baseline, DM, hx of bladder cancer, solitary kidney, CKD 3.  ***   NUTRITION - FOCUSED PHYSICAL EXAM:  {RD Focused Exam List:21252}  Diet Order:   Diet Order             Diet Heart Room service appropriate? Yes with Assist; Fluid consistency: Thin  Diet effective now                   EDUCATION NEEDS:   Not appropriate for education at this time  Skin:  Skin Assessment: Reviewed RN Assessment  Last BM:  PTA  Height:   Ht Readings from Last 1 Encounters:  06/19/23 5\' 8"  (1.727 m)    Weight:   Wt Readings from Last 1 Encounters:  06/23/23 111.6 kg    Ideal Body Weight:     BMI:  Body mass index is 37.41 kg/m.  Estimated Nutritional Needs:   Kcal:  1750-1950 kcals  Protein:  105-125 g  Fluid:  1.8 L    ***

## 2023-06-24 DIAGNOSIS — J9621 Acute and chronic respiratory failure with hypoxia: Secondary | ICD-10-CM | POA: Diagnosis not present

## 2023-06-24 DIAGNOSIS — J9622 Acute and chronic respiratory failure with hypercapnia: Secondary | ICD-10-CM | POA: Diagnosis not present

## 2023-06-24 DIAGNOSIS — R579 Shock, unspecified: Secondary | ICD-10-CM | POA: Diagnosis not present

## 2023-06-24 DIAGNOSIS — N289 Disorder of kidney and ureter, unspecified: Secondary | ICD-10-CM | POA: Diagnosis not present

## 2023-06-24 LAB — GLUCOSE, CAPILLARY
Glucose-Capillary: 145 mg/dL — ABNORMAL HIGH (ref 70–99)
Glucose-Capillary: 147 mg/dL — ABNORMAL HIGH (ref 70–99)
Glucose-Capillary: 177 mg/dL — ABNORMAL HIGH (ref 70–99)
Glucose-Capillary: 240 mg/dL — ABNORMAL HIGH (ref 70–99)
Glucose-Capillary: 270 mg/dL — ABNORMAL HIGH (ref 70–99)
Glucose-Capillary: 285 mg/dL — ABNORMAL HIGH (ref 70–99)

## 2023-06-24 MED ORDER — FUROSEMIDE 10 MG/ML IJ SOLN
20.0000 mg | Freq: Once | INTRAMUSCULAR | Status: AC
  Administered 2023-06-24: 20 mg via INTRAVENOUS
  Filled 2023-06-24: qty 2

## 2023-06-24 MED ORDER — MOMETASONE FURO-FORMOTEROL FUM 200-5 MCG/ACT IN AERO
2.0000 | INHALATION_SPRAY | Freq: Two times a day (BID) | RESPIRATORY_TRACT | Status: DC
  Filled 2023-06-24: qty 8.8

## 2023-06-24 MED ORDER — ALBUTEROL SULFATE (2.5 MG/3ML) 0.083% IN NEBU: 3.0000 mL | INHALATION_SOLUTION | Freq: Four times a day (QID) | RESPIRATORY_TRACT | Status: DC | PRN

## 2023-06-24 MED ORDER — HYDROCORTISONE 2.5 % EX CREA: 1.0000 | TOPICAL_CREAM | Freq: Two times a day (BID) | CUTANEOUS | Status: DC | PRN

## 2023-06-24 MED ORDER — GABAPENTIN 300 MG PO CAPS
600.0000 mg | ORAL_CAPSULE | Freq: Two times a day (BID) | ORAL | Status: DC
  Administered 2023-06-24 – 2023-06-25 (×3): 600 mg via ORAL
  Filled 2023-06-24 (×3): qty 2

## 2023-06-24 MED ORDER — IPRATROPIUM BROMIDE 0.02 % IN SOLN: 0.5000 mg | Freq: Four times a day (QID) | RESPIRATORY_TRACT | Status: DC | PRN

## 2023-06-24 MED ORDER — ASPIRIN 81 MG PO CHEW
81.0000 mg | CHEWABLE_TABLET | Freq: Every day | ORAL | Status: DC
  Administered 2023-06-24 – 2023-06-25 (×2): 81 mg via ORAL
  Filled 2023-06-24 (×2): qty 1

## 2023-06-24 MED ORDER — EMPAGLIFLOZIN 25 MG PO TABS
25.0000 mg | ORAL_TABLET | Freq: Every day | ORAL | Status: DC
  Administered 2023-06-24 – 2023-06-25 (×2): 25 mg via ORAL
  Filled 2023-06-24 (×2): qty 1

## 2023-06-24 MED ORDER — TAMSULOSIN HCL 0.4 MG PO CAPS
0.4000 mg | ORAL_CAPSULE | Freq: Every day | ORAL | Status: DC
  Administered 2023-06-24: 0.4 mg via ORAL
  Filled 2023-06-24: qty 1

## 2023-06-24 MED ORDER — TIOTROPIUM BROMIDE MONOHYDRATE 2.5 MCG/ACT IN AERS: 2.0000 | INHALATION_SPRAY | Freq: Every day | RESPIRATORY_TRACT | Status: DC | PRN

## 2023-06-24 MED ORDER — ATORVASTATIN CALCIUM 80 MG PO TABS
80.0000 mg | ORAL_TABLET | Freq: Every day | ORAL | Status: DC
  Administered 2023-06-24: 80 mg via ORAL
  Filled 2023-06-24: qty 1

## 2023-06-24 MED ORDER — INSULIN GLARGINE-YFGN 100 UNIT/ML ~~LOC~~ SOLN
40.0000 [IU] | Freq: Every day | SUBCUTANEOUS | Status: DC
  Administered 2023-06-24: 40 [IU] via SUBCUTANEOUS
  Filled 2023-06-24 (×2): qty 0.4

## 2023-06-24 MED ORDER — VITAMIN D 25 MCG (1000 UNIT) PO TABS
1000.0000 [IU] | ORAL_TABLET | Freq: Every morning | ORAL | Status: DC
  Administered 2023-06-24 – 2023-06-25 (×2): 1000 [IU] via ORAL
  Filled 2023-06-24 (×2): qty 1

## 2023-06-24 MED ORDER — MOMETASONE FURO-FORMOTEROL FUM 200-5 MCG/ACT IN AERO
2.0000 | INHALATION_SPRAY | Freq: Two times a day (BID) | RESPIRATORY_TRACT | Status: DC
  Administered 2023-06-25: 2 via RESPIRATORY_TRACT
  Filled 2023-06-24: qty 8.8

## 2023-06-24 MED ORDER — ROPINIROLE HCL 0.5 MG PO TABS
1.0000 mg | ORAL_TABLET | Freq: Every day | ORAL | Status: DC
  Administered 2023-06-24: 1 mg via ORAL
  Filled 2023-06-24: qty 2
  Filled 2023-06-24: qty 1

## 2023-06-24 MED ORDER — FERROUS SULFATE 325 (65 FE) MG PO TABS
325.0000 mg | ORAL_TABLET | Freq: Every evening | ORAL | Status: DC
  Administered 2023-06-24: 325 mg via ORAL
  Filled 2023-06-24: qty 1

## 2023-06-24 NOTE — Progress Notes (Signed)
PROGRESS NOTE    Ricky Johnston  WUJ:[id number] DOB: 03-18-1951 DOA: 06/18/2023 PCP: [name], MD    Brief Narrative:   72 year old male with past medical history of hypertension, hyperlipidemia, congestive heart failure, coronary artery disease, COPD, chronic respiratory failure on oxygen, sleep apnea on CPAP and diabetes,who was recently admitted to [facility] twice for syncope followed by respiratory distress and was treated for COPD and CHF exacerbations. He was discharged to SNF on 7/20 with a new oxygen requirement. On 06/18/23, he again presented to [facility] emergency department with weakness and altered sensorium. In the ED, he was hypotensive which initially responded to IV fluids, however, he ultimately required norepinephrine infusion. Lactic acid was normal. He did have increased work of breathing for which she was started on BiPAP. Chest x-ray was unremarkable. ABG showed  mild respiratory acidosis and the patient was intubated.  Patient was then admitted hospital for evaluation and treatment in the ICU unit.  Assessment and plan  Shock, undifferentiated Negative troponins and no EKG changes.  Blood cultures negative in 5 days.  Acute UTI Afebrile.  Completed 5-day course of IV Rocephin.   Acute on chronic  HFpEF  Cardiology on board.  2D echocardiogram with Ejection fraction 55% with no wall motion abnormalities.  On spironolactone and losartan.  Continue intake and output charting, daily weights.  Continue Eliquis..  Patient is on Lasix 20 mg daily at home.  Will start from AM.  Patient is negative balance for 9920 ml. Will give 20mg  IV lasix today   Acute on chronic hypoxic/hypercapnic respiratory failure Status post extubation.  Currently on nasal cannula oxygen at 6 L/min., continue incentive spirometry, Titrate FiO2 with O2 sat goal 92%.  Patient is on 3 L of oxygen by nasal cannula.  Will continue to wean oxygen as needed   COPD without acute  exacerbation Continue Duonebs as needed and Budesonide   CKD stage IIIa Serum Cr is stable around 1.4.  Continue to monitor closely.   Otitis externa  Continue ofloxacin drops to both ears daily  Diabetes mellitus type 2  Continue to monitor closely.  Continue sliding scale insulin.  Will resume Lantus, start Jardiance.  On semaglutide at home   Morbid obesity Body mass index is 37.68 kg/m.  Patient would benefit weight loss as outpatient.  History of DVT.  On Eliquis.  Will continue.  Debility, deconditioning likely need rehabilitation.  Physical therapy has recommended home health PT on discharge.    DVT prophylaxis:  apixaban (ELIQUIS) tablet 5 mg   Code Status:     Code Status: Full Code  Disposition:   Likely to Home Health in 1-2 days  Status is: Inpatient  Remains inpatient appropriate because: Status post ICU transfer, pending clinical improvement, respiratory failure   Family Communication:  Spoke with the patient's wife on the phone and updated her about the clinical condition of the patient.   Consultants:  PCCM  Procedures:  Intubation and subsequent extubation  Antimicrobials:  Rocephin IV  Anti-infectives (From admission, onward)    Start     Dose/Rate Route Frequency Ordered Stop   06/19/23 1045  cefTRIAXone (ROCEPHIN) 2 g in sodium chloride 0.9 % 100 mL IVPB        2 g 200 mL/hr over 30 Minutes Intravenous Every 24 hours 06/19/23 0949 06/23/23 1114       Subjective:  Today, patient was seen and examined at bedside.  Patient complains of shortness of breath  cough denies any chest pain.  Has not had a bowel movement.  Uses 3 L of oxygen at baseline.  Patient states that he does not walk much at home.  Objective: Vitals:   06/24/23 0700 06/24/23 0800 06/24/23 0900 06/24/23 1000  BP: 111/65 117/62 113/70 114/62  Pulse: (!) 56 (!) 53 (!) 56 (!) 50  Resp: (!) 22 (!) 25 (!) 24 20  Temp:  98 F (36.7 C)    TempSrc:  Oral    SpO2: 91% 94%  92% (!) 88%  Weight:      Height:        Intake/Output Summary (Last 24 hours) at 06/24/2023 1100 Last data filed at 06/24/2023 0800 Gross per 24 hour  Intake 400 ml  Output 2400 ml  Net -2000 ml   Filed Weights   06/22/23 0337 06/23/23 0500 06/24/23 0400  Weight: 119.9 kg 111.6 kg 112.4 kg    Physical Examination: Body mass index is 37.68 kg/m.   General:  Obese built, not in obvious distress HENT:   No scleral pallor or icterus noted. Oral mucosa is moist.  Chest: .  Diminished breath sounds bilaterally.  Coarse breath sounds noted. CVS: S1 &S2 heard. No murmur.  Regular rate and rhythm. Abdomen: Soft, nontender, nondistended.  Bowel sounds are heard.   Extremities: No cyanosis, clubbing with bilateral lower extremity edema.   Psych: Alert, awake and oriented, normal mood CNS:  No cranial nerve deficits.  Power equal in all extremities.   Skin: Warm and dry.  No rashes noted.  Data Reviewed:   CBC: Recent Labs  Lab 06/18/23 2230 06/19/23 0633 06/20/23 0415 06/21/23 0620  WBC 5.5 5.4 6.0 4.5  NEUTROABS 4.5  --   --   --   HGB 11.7* 10.9* 10.8* 10.3*  HCT 39.6 37.7* 35.4* 34.0*  MCV 98.8 96.9 98.6 96.0  PLT 164 166 154 166    Basic Metabolic Panel: Recent Labs  Lab 06/18/23 2230 06/19/23 0633 06/20/23 0415 06/20/23 1607 06/21/23 0620 06/21/23 1811 06/22/23 0908  NA 138 139 140  --  141  --  145  K 4.2 3.8 4.4  --  4.1  --  4.1  CL 97* 103 99  --  99  --  98  CO2 32 29 31  --  32  --  35*  GLUCOSE 217* 109* 82  --  176*  --  228*  BUN 29* 24* 25*  --  33*  --  39*  CREATININE 1.64* 1.54* 1.44*  --  1.44*  --  1.43*  CALCIUM 8.3* 8.0* 8.1*  --  8.4*  --  8.9  MG  --  2.0 2.0 1.9 2.1 2.3  --   PHOS  --  2.8 3.4 4.4 3.4 2.9  --     Liver Function Tests: Recent Labs  Lab 06/18/23 2230 06/19/23 0633  AST 17 14*  ALT 14 12  ALKPHOS 79 70  BILITOT 0.7 0.6  PROT 7.3 6.2*  ALBUMIN 3.0* 2.5*     Radiology Studies: No results found.    LOS: 5  days    Joycelyn Das, MD [facility] Available via Epic secure chat 7am-7pm After these hours, please refer to coverage provider listed on [email or web address] 06/24/2023, 11:00 AM

## 2023-06-24 NOTE — Hospital Course (Signed)
72 year old male with past medical history of hypertension, hyperlipidemia, congestive heart failure, coronary artery disease, COPD, chronic respiratory failure on oxygen, sleep apnea on CPAP and diabetes,who was recently admitted to [facility] twice for syncope followed by respiratory distress and was treated for COPD and CHF exacerbations. He was discharged to SNF on 7/20 with a new oxygen requirement. On 06/18/23, he again presented to [facility] emergency department with weakness and altered sensorium. In the ED, he was hypotensive which initially responded to IV fluids, however, he ultimately required norepinephrine infusion. Lactic acid was normal. He did have increased work of breathing for which she was started on BiPAP. Chest x-ray was unremarkable. ABG showed  mild respiratory acidosis and the patient was intubated.  Patient was then admitted hospital for evaluation and treatment in the ICU unit.  Assessment and plan  Shock, undifferentiated Negative troponins and no EKG changes.  Blood cultures negative in 5 days.  Acute UTI Afebrile.  Completed 5-day course of IV Rocephin.   Acute on chronic  HFpEF  Cardiology on board.  2D echocardiogram with Ejection fraction 55% with no wall motion abnormalities.  On spironolactone losartan.  Continue intake and output charting,Daily weights.   Acute on chronic hypoxic/hypercapnic respiratory failure Status post extubation.  Currently on nasal cannula oxygen at 6 L/min., continue incentive spirometry, Titrate FiO2 with O2 sat goal 92%   COPD without acute exacerbation Continue Duonebs as needed and Budesonide   CKD stage IIIa Serum Cr is stable around 1.4.  Continue to monitor closely.   Otitis externa  Continue ofloxacin drops to both ears daily Cardiology   Diabetes type 2  Continue to monitor closely.  Sliding scale insulin.   Morbid obesity Body mass index is 37.68 kg/m.  Patient would benefit weight loss as  outpatient.  Debility, deconditioning likely need rehabilitation.  Will get PT OT evaluation.

## 2023-06-24 NOTE — Progress Notes (Signed)
Pt has home CPAP at bedside. RT hooked up CPAP to power and 2L O2 for use, pt placed on himself and is tolerating well.

## 2023-06-25 DIAGNOSIS — R579 Shock, unspecified: Secondary | ICD-10-CM | POA: Diagnosis not present

## 2023-06-25 DIAGNOSIS — J9622 Acute and chronic respiratory failure with hypercapnia: Secondary | ICD-10-CM | POA: Diagnosis not present

## 2023-06-25 DIAGNOSIS — J9621 Acute and chronic respiratory failure with hypoxia: Secondary | ICD-10-CM | POA: Diagnosis not present

## 2023-06-25 DIAGNOSIS — N289 Disorder of kidney and ureter, unspecified: Secondary | ICD-10-CM | POA: Diagnosis not present

## 2023-06-25 LAB — GLUCOSE, CAPILLARY
Glucose-Capillary: 193 mg/dL — ABNORMAL HIGH (ref 70–99)
Glucose-Capillary: 196 mg/dL — ABNORMAL HIGH (ref 70–99)
Glucose-Capillary: 198 mg/dL — ABNORMAL HIGH (ref 70–99)
Glucose-Capillary: 295 mg/dL — ABNORMAL HIGH (ref 70–99)

## 2023-06-25 LAB — BASIC METABOLIC PANEL WITH GFR
Anion gap: 13 (ref 5–15)
BUN: 30 mg/dL — ABNORMAL HIGH (ref 8–23)
CO2: 33 mmol/L — ABNORMAL HIGH (ref 22–32)
Calcium: 8.9 mg/dL (ref 8.9–10.3)
Chloride: 94 mmol/L — ABNORMAL LOW (ref 98–111)
Creatinine, Ser: 1.51 mg/dL — ABNORMAL HIGH (ref 0.61–1.24)
GFR, Estimated: 49 mL/min — ABNORMAL LOW (ref >60)
Glucose, Bld: 198 mg/dL — ABNORMAL HIGH (ref 70–99)
Potassium: 4 mmol/L (ref 3.5–5.1)
Sodium: 140 mmol/L (ref 135–145)

## 2023-06-25 LAB — CBC
HCT: 38.6 % — ABNORMAL LOW (ref 39.0–52.0)
Hemoglobin: 11.5 g/dL — ABNORMAL LOW (ref 13.0–17.0)
MCH: 28.3 pg (ref 26.0–34.0)
MCHC: 29.8 g/dL — ABNORMAL LOW (ref 30.0–36.0)
MCV: 95.1 fL (ref 80.0–100.0)
Platelets: 221 10*3/uL (ref 150–400)
RBC: 4.06 MIL/uL — ABNORMAL LOW (ref 4.22–5.81)
RDW: 15.2 % (ref 11.5–15.5)
WBC: 6.7 10*3/uL (ref 4.0–10.5)
nRBC: 0 % (ref 0.0–0.2)

## 2023-06-25 LAB — MAGNESIUM: Magnesium: 2.5 mg/dL — ABNORMAL HIGH (ref 1.7–2.4)

## 2023-06-25 MED ORDER — APIXABAN 5 MG PO TABS: 5.0000 mg | ORAL_TABLET | Freq: Two times a day (BID) | ORAL | 2 refills | Status: DC

## 2023-06-25 MED ORDER — SPIRONOLACTONE 25 MG PO TABS: 12.5000 mg | ORAL_TABLET | Freq: Every day | ORAL | 2 refills | Status: DC

## 2023-06-25 NOTE — Discharge Summary (Signed)
Physician Discharge Summary  Ricky Johnston ZOX:[id number] DOB: 1950-12-12 DOA: 06/18/2023  PCP: [name], MD  Admit date: 06/18/2023 Discharge date: 06/25/2023  Admitted From: Home  Discharge disposition: Home health PT   Recommendations for Outpatient Follow-Up:   Follow up with your primary care provider in one week.  Check CBC, BMP, magnesium in the next visit   Discharge Diagnosis:   Principal Problem:   Acute on chronic respiratory failure with hypoxia and hypercapnia (HCC) Active Problems:   Shock (HCC)   Renal insufficiency   Acute and chronic respiratory failure with hypercapnia (HCC)   Discharge Condition: Improved.  Diet recommendation: Low sodium, heart healthy.    Wound care: None.  Code status: Full.   History of Present Illness:   72 year old male with past medical history of hypertension, hyperlipidemia, congestive heart failure, coronary artery disease, COPD, chronic respiratory failure on oxygen, sleep apnea on CPAP and diabetes,who was recently admitted to [facility] twice for syncope followed by respiratory distress and was treated for COPD and CHF exacerbations. He was discharged to SNF on 06/17/23 with a new oxygen requirement. On 06/18/23, he again presented to [facility] emergency department with weakness and altered sensorium. In the ED, patient was hypotensive which initially responded to IV fluids, however, he ultimately required norepinephrine infusion. Lactic acid was normal. He did have increased work of breathing for which she was started on BiPAP. Chest x-ray was unremarkable. ABG showed  mild respiratory acidosis and the patient was intubated.  Patient was then admitted hospital for evaluation and treatment in the ICU unit.  Hospital Course:   Following conditions were addressed during hospitalization as listed below,  Shock, undifferentiated Negative troponins and no EKG changes.  Blood cultures negative in 5  days.  Has resolved at this time.   Acute UTI Afebrile.  Completed 5-day course of IV Rocephin.   Acute on chronic  HFpEF  Cardiology was consulted during hospitalization.  2D echocardiogram with Ejection fraction 55% with no wall motion abnormalities.  At this time patient has been resumed on Lasix from home.  Continue spironolactone and losartan on discharge.  Patient did have significant diuresis during hospitalization.  Patient had 10690 mL negative balance during hospitalization.  Acute on chronic hypoxic/hypercapnic respiratory failure Status post extubation.  Currently on nasal cannula oxygen at 4 L/min.  .  Patient is on 3 L of oxygen by nasal cannula at home.  Will continue oxygen on discharge.Marland Kitchen     COPD without acute exacerbation Continue albuterol and Advair.   CKD stage IIIa Serum Cr remained stable.   Otitis externa  Received ciprofloxacin drops during hospitalization.  Improved.   Diabetes mellitus type 2  Will continue Lantus, Jardiance.  On semaglutide at home   Morbid obesity Body mass index is 37.68 kg/m.  Patient would benefit weight loss as outpatient.   History of DVT.  On Eliquis.  Will continue on discharge..   Debility, deconditioning likely need rehabilitation.  Physical therapy has recommended home health PT on discharge.  Disposition.  At this time, patient is stable for disposition home with outpatient PCP follow-up.  Medical Consultants:   PCCM  Procedures:    Intubation and subsequent extubation  Subjective:   Today, patient was seen and examined at bedside.  Feels okay.  Denies any shortness of breath, dyspnea fever chills or rigor.  Discharge Exam:   Vitals:   06/25/23 0833 06/25/23 0834  BP:    Pulse: 77 63  Resp:    Temp:    SpO2: (!) 77% 90%   Vitals:   06/25/23 0825 06/25/23 0830 06/25/23 0833 06/25/23 0834  BP: (!) 115/52     Pulse: 78 79 77 63  Resp:      Temp: 99.4 F (37.4 C)     TempSrc: Oral     SpO2: (!) 81%   (!) 77% 90%  Weight:      Height:       Body mass index is 37.94 kg/m.   General: Alert awake, not in obvious distress, obese, on nasal cannula  HENT: pupils equally reacting to light,  No scleral pallor or icterus noted. Oral mucosa is moist.  Chest:   Diminished breath sounds bilaterally.  Coarse breath sounds. CVS: S1 &S2 heard. No murmur.  Regular rate and rhythm. Abdomen: Soft, nontender, nondistended.  Bowel sounds are heard.   Extremities: No cyanosis, clubbing with bilateral lower extremity edema, peripheral pulses are palpable. Psych: Alert, awake and oriented, normal mood CNS:  No cranial nerve deficits.  Power equal in all extremities.   Skin: Warm and dry.  No rashes noted.  The results of significant diagnostics from this hospitalization (including imaging, microbiology, ancillary and laboratory) are listed below for reference.     Diagnostic Studies:   ECHOCARDIOGRAM COMPLETE  Result Date: 06/19/2023    ECHOCARDIOGRAM REPORT   Patient Name:   Ricky WIEDMAIER Sr. Date of Exam: 06/19/2023 Medical Rec #:  [id number]          Height:       68.0 in Accession #:    [id number]         Weight:       264.3 lb Date of Birth:  Mar 30, 1951          BSA:          2.301 m Patient Age:    72 years           BP:           104/65 mmHg Patient Gender: M                  HR:           65 bpm. Exam Location:  Inpatient Procedure: 2D Echo, Cardiac Doppler, Color Doppler and Intracardiac            Opacification Agent Indications:    Shock  History:        Patient has prior history of Echocardiogram examinations, most                 recent 05/11/2023. CHF, CAD, COPD; Risk Factors:Hypertension and                 Diabetes.  Sonographer:    Darlys Gales Referring Phys: [phone] Clarene Critchley HOFFMAN IMPRESSIONS  1. Left ventricular ejection fraction, by estimation, is 35 to 40%. The left ventricle has moderately decreased function. The left ventricle demonstrates regional wall motion abnormalities (see scoring  diagram/findings for description). Left ventricular  diastolic parameters are indeterminate.  2. Right ventricular systolic function was not well visualized. The right ventricular size is not well visualized. Tricuspid regurgitation signal is inadequate for assessing PA pressure.  3. The mitral valve is normal in structure. No evidence of mitral valve regurgitation.  4. The aortic valve was not well visualized. Aortic valve regurgitation is not visualized. No aortic stenosis is present.  5. The inferior vena cava is dilated in size with >50%  respiratory variability, suggesting right atrial pressure of 8 mmHg. FINDINGS  Left Ventricle: Left ventricular ejection fraction, by estimation, is 35 to 40%. The left ventricle has moderately decreased function. The left ventricle demonstrates regional wall motion abnormalities. Definity contrast agent was given IV to delineate the left ventricular endocardial borders. The left ventricular internal cavity size was normal in size. There is no left ventricular hypertrophy. Left ventricular diastolic parameters are indeterminate.  LV Wall Scoring: The entire lateral wall is akinetic. The entire anterior wall, entire septum, entire inferior wall, and apex are normal. Right Ventricle: The right ventricular size is not well visualized. Right vetricular wall thickness was not well visualized. Right ventricular systolic function was not well visualized. Tricuspid regurgitation signal is inadequate for assessing PA pressure. Left Atrium: Left atrial size was normal in size. Right Atrium: Right atrial size was normal in size. Pericardium: There is no evidence of pericardial effusion. Mitral Valve: The mitral valve is normal in structure. No evidence of mitral valve regurgitation. Tricuspid Valve: The tricuspid valve is normal in structure. Tricuspid valve regurgitation is trivial. Aortic Valve: The aortic valve was not well visualized. Aortic valve regurgitation is not visualized. No  aortic stenosis is present. Aortic valve mean gradient measures 7.0 mmHg. Aortic valve peak gradient measures 12.2 mmHg. Aortic valve area, by VTI measures 1.98 cm. Pulmonic Valve: The pulmonic valve was not well visualized. Pulmonic valve regurgitation is not visualized. Aorta: The aortic root and ascending aorta are structurally normal, with no evidence of dilitation. Venous: The inferior vena cava is dilated in size with greater than 50% respiratory variability, suggesting right atrial pressure of 8 mmHg. IAS/Shunts: The interatrial septum was not well visualized.  LEFT VENTRICLE PLAX 2D LVIDd:         5.50 cm   Diastology LVIDs:         4.60 cm   LV e' medial:    7.51 cm/s LV PW:         0.90 cm   LV E/e' medial:  12.2 LV IVS:        0.70 cm   LV e' lateral:   12.60 cm/s LVOT diam:     1.90 cm   LV E/e' lateral: 7.3 LV SV:         67 LV SV Index:   29 LVOT Area:     2.84 cm  RIGHT VENTRICLE             IVC RV S prime:     14.60 cm/s  IVC diam: 2.30 cm TAPSE (M-mode): 2.4 cm LEFT ATRIUM             Index        RIGHT ATRIUM           Index LA Vol (A2C):   27.4 ml 11.91 ml/m  RA Area:     14.50 cm LA Vol (A4C):   48.9 ml 21.25 ml/m  RA Volume:   38.00 ml  16.52 ml/m LA Biplane Vol: 37.1 ml 16.12 ml/m  AORTIC VALVE AV Area (Vmax):    2.35 cm AV Area (Vmean):   2.29 cm AV Area (VTI):     1.98 cm AV Vmax:           175.00 cm/s AV Vmean:          124.000 cm/s AV VTI:            0.336 m AV Peak Grad:      12.2 mmHg AV  Mean Grad:      7.0 mmHg LVOT Vmax:         145.00 cm/s LVOT Vmean:        100.000 cm/s LVOT VTI:          0.235 m LVOT/AV VTI ratio: 0.70  AORTA Ao Root diam: 3.10 cm Ao Asc diam:  3.70 cm MITRAL VALVE MV Area (PHT): 2.41 cm    SHUNTS MV Decel Time: 315 msec    Systemic VTI:  0.24 m MV E velocity: 91.70 cm/s  Systemic Diam: 1.90 cm MV A velocity: 70.20 cm/s MV E/A ratio:  1.31 Epifanio Lesches MD Electronically signed by Epifanio Lesches MD Signature Date/Time: 06/19/2023/11:40:08 AM     Final    DG Chest Portable 1 View  Result Date: 06/19/2023 CLINICAL DATA:  Intubation. EXAM: PORTABLE CHEST 1 VIEW COMPARISON:  Chest radiograph dated 06/18/2023. FINDINGS: Endotracheal tube with tip approximately 4 cm above the carina. Enteric tube extends below the diaphragm with tip in the proximal body of the stomach. Mild cardiomegaly with probable mild vascular congestion. No focal consolidation, pleural effusion, pneumothorax. No acute osseous pathology. IMPRESSION: 1. Endotracheal tube with tip approximately 4 cm above the carina. 2. Mild cardiomegaly with probable mild vascular congestion. Electronically Signed   By: Elgie Collard M.D.   On: 06/19/2023 02:21   CT Head Wo Contrast  Result Date: 06/19/2023 CLINICAL DATA:  Mental status change, unknown cause EXAM: CT HEAD WITHOUT CONTRAST TECHNIQUE: Contiguous axial images were obtained from the base of the skull through the vertex without intravenous contrast. RADIATION DOSE REDUCTION: This exam was performed according to the departmental dose-optimization program which includes automated exposure control, adjustment of the mA and/or kV according to patient size and/or use of iterative reconstruction technique. COMPARISON:  None Available. FINDINGS: Brain: There is atrophy and chronic small vessel disease changes. No acute intracranial abnormality. Specifically, no hemorrhage, hydrocephalus, mass lesion, acute infarction, or significant intracranial injury. Vascular: No hyperdense vessel or unexpected calcification. Skull: No acute calvarial abnormality. Sinuses/Orbits: No acute findings Other: None IMPRESSION: Atrophy, chronic microvascular disease. No acute intracranial abnormality. Electronically Signed   By: Charlett Nose M.D.   On: 06/19/2023 01:08   DG Chest Portable 1 View  Result Date: 06/18/2023 CLINICAL DATA:  Code sepsis EXAM: PORTABLE CHEST 1 VIEW COMPARISON:  05/15/2023 FINDINGS: The heart size and mediastinal contours are within  normal limits. Both lungs are clear. The visualized skeletal structures are unremarkable. IMPRESSION: No active disease. Electronically Signed   By: Charlett Nose M.D.   On: 06/18/2023 22:43     Labs:   Basic Metabolic Panel: Recent Labs  Lab 06/19/23 1610 06/20/23 0415 06/20/23 1607 06/21/23 0620 06/21/23 1811 06/22/23 0908 06/25/23 0133  NA 139 140  --  141  --  145 140  K 3.8 4.4  --  4.1  --  4.1 4.0  CL 103 99  --  99  --  98 94*  CO2 29 31  --  32  --  35* 33*  GLUCOSE 109* 82  --  176*  --  228* 198*  BUN 24* 25*  --  33*  --  39* 30*  CREATININE 1.54* 1.44*  --  1.44*  --  1.43* 1.51*  CALCIUM 8.0* 8.1*  --  8.4*  --  8.9 8.9  MG 2.0 2.0 1.9 2.1 2.3  --  2.5*  PHOS 2.8 3.4 4.4 3.4 2.9  --   --    GFR Estimated Creatinine  Clearance: 54 mL/min (A) (by C-G formula based on SCr of 1.51 mg/dL (H)). Liver Function Tests: Recent Labs  Lab 06/18/23 2230 06/19/23 0633  AST 17 14*  ALT 14 12  ALKPHOS 79 70  BILITOT 0.7 0.6  PROT 7.3 6.2*  ALBUMIN 3.0* 2.5*   No results for input(s): "LIPASE", "AMYLASE" in the last 168 hours. No results for input(s): "AMMONIA" in the last 168 hours. Coagulation profile Recent Labs  Lab 06/18/23 2230 06/19/23 0633  INR 1.2 1.3*    CBC: Recent Labs  Lab 06/18/23 2230 06/19/23 0633 06/20/23 0415 06/21/23 0620 06/25/23 0133  WBC 5.5 5.4 6.0 4.5 6.7  NEUTROABS 4.5  --   --   --   --   HGB 11.7* 10.9* 10.8* 10.3* 11.5*  HCT 39.6 37.7* 35.4* 34.0* 38.6*  MCV 98.8 96.9 98.6 96.0 95.1  PLT 164 166 154 166 221   Cardiac Enzymes: No results for input(s): "CKTOTAL", "CKMB", "CKMBINDEX", "TROPONINI" in the last 168 hours. BNP: Invalid input(s): "POCBNP" CBG: Recent Labs  Lab 06/24/23 2023 06/25/23 0029 06/25/23 0420 06/25/23 0739 06/25/23 1140  GLUCAP 285* 193* 198* 196* 295*   D-Dimer No results for input(s): "DDIMER" in the last 72 hours. Hgb A1c No results for input(s): "HGBA1C" in the last 72 hours. Lipid  Profile No results for input(s): "CHOL", "HDL", "LDLCALC", "TRIG", "CHOLHDL", "LDLDIRECT" in the last 72 hours. Thyroid function studies No results for input(s): "TSH", "T4TOTAL", "T3FREE", "THYROIDAB" in the last 72 hours.  Invalid input(s): "FREET3" Anemia work up No results for input(s): "VITAMINB12", "FOLATE", "FERRITIN", "TIBC", "IRON", "RETICCTPCT" in the last 72 hours. Microbiology Recent Results (from the past 240 hour(s))  Culture, blood (Routine x 2)     Status: None   Collection Time: 06/18/23 10:27 PM   Specimen: BLOOD LEFT HAND  Result Value Ref Range Status   Specimen Description BLOOD LEFT HAND  Final   Special Requests   Final    BOTTLES DRAWN AEROBIC AND ANAEROBIC Blood Culture adequate volume   Culture   Final    NO GROWTH 5 DAYS Performed at [facility], [address], [place], [place] [address]    Report Status 06/23/2023 FINAL  Final  Culture, blood (Routine x 2)     Status: None   Collection Time: 06/18/23 10:30 PM   Specimen: BLOOD RIGHT FOREARM  Result Value Ref Range Status   Specimen Description BLOOD RIGHT FOREARM  Final   Special Requests   Final    BOTTLES DRAWN AEROBIC AND ANAEROBIC Blood Culture adequate volume   Culture   Final    NO GROWTH 5 DAYS Performed at [facility], [address], [place], [place] [address]    Report Status 06/23/2023 FINAL  Final  Resp panel by RT-PCR (RSV, Flu A&B, Covid) Anterior Nasal Swab     Status: None   Collection Time: 06/19/23  1:03 AM   Specimen: Anterior Nasal Swab  Result Value Ref Range Status   SARS Coronavirus 2 by RT PCR NEGATIVE NEGATIVE Final    Comment: (NOTE) SARS-CoV-2 target nucleic acids are NOT DETECTED.  The SARS-CoV-2 RNA is generally detectable in upper respiratory specimens during the acute phase of infection. The lowest concentration of SARS-CoV-2 viral copies this assay can detect is 138 copies/mL. A negative result does not preclude SARS-Cov-2 infection and should not be used as  the sole basis for treatment or other patient management decisions. A negative result may occur with  improper specimen collection/handling, submission of specimen  other than nasopharyngeal swab, presence of viral mutation(s) within the areas targeted by this assay, and inadequate number of viral copies(<138 copies/mL). A negative result must be combined with clinical observations, patient history, and epidemiological information. The expected result is Negative.  Fact Sheet for Patients:  BloggerCourse.com  Fact Sheet for Healthcare Providers:  SeriousBroker.it  This test is no t yet approved or cleared by the [place] FDA and  has been authorized for detection and/or diagnosis of SARS-CoV-2 by FDA under an Emergency Use Authorization (EUA). This EUA will remain  in effect (meaning this test can be used) for the duration of the COVID-19 declaration under Section 564(b)(1) of the Act, 21 U.S.C.section 360bbb-3(b)(1), unless the authorization is terminated  or revoked sooner.       Influenza A by PCR NEGATIVE NEGATIVE Final   Influenza B by PCR NEGATIVE NEGATIVE Final    Comment: (NOTE) The Xpert Xpress SARS-CoV-2/FLU/RSV plus assay is intended as an aid in the diagnosis of influenza from Nasopharyngeal swab specimens and should not be used as a sole basis for treatment. Nasal washings and aspirates are unacceptable for Xpert Xpress SARS-CoV-2/FLU/RSV testing.  Fact Sheet for Patients: BloggerCourse.com  Fact Sheet for Healthcare Providers: SeriousBroker.it  This test is not yet approved or cleared by the [place] FDA and has been authorized for detection and/or diagnosis of SARS-CoV-2 by FDA under an Emergency Use Authorization (EUA). This EUA will remain in effect (meaning this test can be used) for the duration of the COVID-19 declaration under Section 564(b)(1) of  the Act, 21 U.S.C. section 360bbb-3(b)(1), unless the authorization is terminated or revoked.     Resp Syncytial Virus by PCR NEGATIVE NEGATIVE Final    Comment: (NOTE) Fact Sheet for Patients: BloggerCourse.com  Fact Sheet for Healthcare Providers: SeriousBroker.it  This test is not yet approved or cleared by the [place] FDA and has been authorized for detection and/or diagnosis of SARS-CoV-2 by FDA under an Emergency Use Authorization (EUA). This EUA will remain in effect (meaning this test can be used) for the duration of the COVID-19 declaration under Section 564(b)(1) of the Act, 21 U.S.C. section 360bbb-3(b)(1), unless the authorization is terminated or revoked.  Performed at [facility], [address], [place], [place] [address]   MRSA Next Gen by PCR, Nasal     Status: Abnormal   Collection Time: 06/19/23  5:18 AM   Specimen: Nasal Mucosa; Nasal Swab  Result Value Ref Range Status   MRSA by PCR Next Gen DETECTED (A) NOT DETECTED Final    Comment: RESULT CALLED TO, READ BACK BY AND VERIFIED WITH: G BLUST,RN@[id number]  06/19/23 MK (NOTE) The GeneXpert MRSA Assay (FDA approved for NASAL specimens only), is one component of a comprehensive MRSA colonization surveillance program. It is not intended to diagnose MRSA infection nor to guide or monitor treatment for MRSA infections. Test performance is not FDA approved in patients less than 37 years old. Performed at [facility], [address], [place], [place] [address]      Discharge Instructions:   Discharge Instructions     (HEART FAILURE PATIENTS) Call MD:  Anytime you have any of the following symptoms: 1) 3 pound weight gain in 24 hours or 5 pounds in 1 week 2) shortness of breath, with or without a dry hacking cough 3) swelling in the hands, feet or stomach 4) if you have to sleep on extra pillows at night in order to breathe.   Complete by: As directed  Diet - low sodium heart healthy   Complete by: As directed    Discharge instructions   Complete by: As directed    Follow-up with your primary care provider in 1 week.  Check blood work at that time.  Take medications as prescribed.  Patient noted that your blood thinner has been changed to twice a day.  No overexertion.  Continue oxygen at home.  Seek medical attention for worsening symptoms.  Continue CPAP at home.   Increase activity slowly   Complete by: As directed    No wound care   Complete by: As directed       Allergies as of 06/25/2023       Reactions   Poison Ivy Extract [poison Ivy Extract] Rash   Poison Oak Extract Rash        Medication List     TAKE these medications    albuterol 108 (90 Base) MCG/ACT inhaler Commonly known as: VENTOLIN HFA Inhale 2 puffs into the lungs every 6 (six) hours as needed for wheezing or shortness of breath.   amoxicillin-clavulanate 875-125 MG tablet Commonly known as: AUGMENTIN Take 2 tablets by mouth daily at 12 noon.   apixaban 5 MG Tabs tablet Commonly known as: ELIQUIS Take 1 tablet (5 mg total) by mouth 2 (two) times daily. What changed: when to take this   aspirin 81 MG chewable tablet Chew 1 tablet (81 mg total) by mouth 2 (two) times daily after a meal.   atorvastatin 80 MG tablet Commonly known as: LIPITOR Take 80 mg by mouth at bedtime.   cholecalciferol 1000 units tablet Commonly known as: VITAMIN D Take 1,000 Units by mouth every morning.   ferrous sulfate 325 (65 FE) MG tablet Take 325 mg by mouth every evening.   fluocinonide 0.05 % external solution Commonly known as: LIDEX Apply 1 Application topically daily as needed (itchy rash on scalp).   fluticasone 50 MCG/ACT nasal spray Commonly known as: FLONASE Place 1-2 sprays into both nostrils in the morning.   Fluticasone-Salmeterol 250-50 MCG/DOSE Aepb Commonly known as: ADVAIR Inhale 1 puff into the lungs daily.   furosemide 20 MG  tablet Commonly known as: LASIX Take 20 mg by mouth daily.   gabapentin 300 MG capsule Commonly known as: NEURONTIN Take 600 mg by mouth 2 (two) times daily.   hydrocortisone 2.5 % cream Apply 1 Application topically 2 (two) times daily as needed (rash on face).   insulin aspart 100 UNIT/ML FlexPen Commonly known as: NOVOLOG Before each meal 3 times a day, 140-199 - 2 units, 200-250 - 6 units, 251-299 - 8 units,  300-349 - 10 units,  350 or above 14 units.   insulin glargine 100 UNIT/ML injection Commonly known as: Lantus Inject 0.4 mLs (40 Units total) into the skin 2 (two) times daily.   insulin regular human CONCENTRATED 500 UNIT/ML KwikPen Commonly known as: HUMULIN R Inject 50-100 Units into the skin See admin instructions. Inject 100 units subcutaneously with breakfast, 100 units subcutaneously with lunch and 50 units subcutaneously with dinner.   Jardiance 25 MG Tabs tablet Generic drug: empagliflozin Take 25 mg by mouth daily.   ketoconazole 2 % shampoo Commonly known as: NIZORAL Apply 1 Application topically every Monday, Wednesday, and Friday.   losartan 50 MG tablet Commonly known as: COZAAR Take 1 tablet by mouth daily.   nystatin powder Apply 1 Application topically 2 (two) times daily. Apply to groin area topically two times a day for reddened area. Please  apply to groin and legs and any place there is redness.   OXYGEN Inhale 2-3 L/min into the lungs See admin instructions. 2 L/min continuous. May increase to 3 L/min as needed for shortness of breath.   Ozempic (0.25 or 0.5 MG/DOSE) 2 MG/1.5ML Sopn Generic drug: Semaglutide(0.25 or 0.5MG /DOS) Inject 0.25 mg into the skin every Sunday.   rOPINIRole 1 MG tablet Commonly known as: REQUIP Take 1 tablet by mouth at bedtime.   rOPINIRole 0.5 MG tablet Commonly known as: REQUIP Take 1 tablet by mouth at bedtime.   Spiriva Respimat 2.5 MCG/ACT Aers Generic drug: Tiotropium Bromide Monohydrate Inhale 2  each into the lungs daily as needed (wheezing, shortness of breath.).   spironolactone 25 MG tablet Commonly known as: ALDACTONE Take 0.5 tablets (12.5 mg total) by mouth daily. Start taking on: June 26, 2023   tamsulosin 0.4 MG Caps capsule Commonly known as: FLOMAX Take 0.4 mg by mouth at bedtime.               Durable Medical Equipment  (From admission, onward)           Start     Ordered   06/23/23 0825  For home use only DME Other see comment  Once       Comments: Michiel Sites Lift  Question:  Length of Need  Answer:  Lifetime   06/23/23 1610            Follow-up Information     Gerrie Nordmann II, MD Follow up in 1 week(s).   Specialty: Student [address] [place] [address] [phone]                  Time coordinating discharge: 39 minutes  Signed:  Roma Bierlein  [facility] 06/25/2023, 4:49 PM

## 2023-06-25 NOTE — TOC Transition Note (Signed)
Transition of Care [facility]) - CM/SW Discharge Note   Patient Details  Name: FREDRICO PASSON Sr. MRN: [id number] Date of Birth: June 30, 1951  Transition of Care [facility]) CM/SW Contact:  Ronny Bacon, RN Phone Number: 06/25/2023, 11:24 AM   Clinical Narrative:   Patient is being discharged home today. Spoke with patient by phone. Patient would like to continue [facility] PT with [facility] and would like a Nurse, adult ordered through his [place]. Email sent to [employer] at [facility] with copy of DME order. [facility] confirmed that patient is under their care for [facility] PT. Per patient, wife will transport him home.    Final next level of care: Home w Home Health Services Barriers to Discharge: No Barriers Identified   Patient Goals and CMS Choice CMS Medicare.gov Compare Post Acute Care list provided to:: Patient Represenative (must comment) [place] Pepper)    Discharge Placement                         Discharge Plan and Services Additional resources added to the After Visit Summary for   In-house Referral: Clinical Social Work Discharge Planning Services: CM Consult Post Acute Care Choice: Home Health          DME Arranged: Other see comment Michiel [employer] through [employer]) DME Agency: [facility], [place] Date DME Agency Contacted: 06/25/23 Time DME Agency Contacted: 1119 Representative spoke with at DME Agency: Wonda Cerise   [facility] Agency: [facility]        Social Determinants of Health (SDOH) Interventions SDOH Screenings   Food Insecurity: No Food Insecurity (05/06/2023)  Housing: Low Risk  (05/06/2023)  Transportation Needs: No Transportation Needs (05/06/2023)  Utilities: Patient Declined (05/07/2023)  Depression (PHQ2-9): Low Risk  (11/08/2021)  Tobacco Use: Medium Risk (06/18/2023)     Readmission Risk Interventions     No data to display

## 2023-10-12 NOTE — Progress Notes (Unsigned)
Cardiology Clinic Note   Patient Name: Ricky BECKHAM Sr. Date of Encounter: 10/16/2023  Primary Care Provider:  Eloisa Northern, MD Primary Cardiologist:  Olga Millers, MD  Patient Profile    Ricky Course Sr. 72 year old male presents the clinic today for follow-up evaluation of his chronic systolic CHF and coronary artery disease.  Past Medical History    Past Medical History:  Diagnosis Date   Arthritis    Cancer (HCC)    bladder cancer currently 2016   CHF (congestive heart failure) (HCC)    Complication of anesthesia    hard for him to wake up from Anesthesia from left nephrectomy   COPD (chronic obstructive pulmonary disease) (HCC)    Coronary artery disease    Diabetes mellitus without complication (HCC)    DVT (deep venous thrombosis) (HCC) 01/08/2014, 2010   upper extremity   GERD (gastroesophageal reflux disease)    Hallux limitus 05/18/2015   from notes from [facility]    Headache    migraines   History of kidney stones    Hypercholesterolemia    Hypertension    Impaired hearing    Intervertebral disc syndrome    Kidney stones    Pulmonary fibrosis (HCC)    Renal calculi 01/08/2014   frrom noted from [facility] .in chart   Renal insufficiency 06/19/2023   Restless legs 01/08/2014   Sciatic leg pain    paralysis of sciatic nerve   Sleep apnea    CPAP/BIPAP   Tinnitus    Past Surgical History:  Procedure Laterality Date   ABDOMINAL SURGERY     BACK SURGERY     BLADDER REPAIR     EYE SURGERY     HERNIA REPAIR     LEFT HEART CATHETERIZATION WITH CORONARY ANGIOGRAM N/A 01/09/2015   Procedure: LEFT HEART CATHETERIZATION WITH CORONARY ANGIOGRAM;  Surgeon: Robynn Pane, MD;  Location: [facility] CATH LAB;  Service: Cardiovascular;  Laterality: N/A;   NEPHROURETERECTOMY  03/18/2016   with bladder cuff excision- from noted in chart from [facility]   TOTAL HIP ARTHROPLASTY Left 02/10/2017   Procedure: LEFT TOTAL HIP ARTHROPLASTY ANTERIOR APPROACH;  Surgeon:  Kathryne Hitch, MD;  Location: [facility] ORS;  Service: Orthopedics;  Laterality: Left;    Allergies  Allergies  Allergen Reactions   Poison Ivy Extract [Poison Ivy Extract] Rash   Poison Oak Extract Rash    History of Present Illness    Ricky GUMMO Sr. has a PMH of active bladder cancer, HTN, HLD, chronic systolic CHF, CAD, COPD, respiratory failure, OSA, esophageal reflux, IDDM, type 2 diabetes anemia, morbid obesity, syncope, and bradycardia.  He was admitted to the hospital on 05/06/2023 and discharged on 05/10/2023.  He presented with reports of syncopal event.  This was felt to be orthostatic or vasovagal in nature.  He was noted to have severe ambulatory dysfunction and weakness.  He was also found to have fluid volume overload.   He was readmitted on 05/11/2023 until 05/18/2023.  He was admitted with respiratory distress.  He was noted to have CHF exacerbation.  He required intubation and was transferred to [facility], ICU.  He developed bradycardia and cardiology was consulted.  He stabilized and was transferred to internal medicine service.  He received diuresis and was stabilized on 3-4 L nasal cannula.  His CHF and COPD exacerbation resolved.  His initial syncopal episode was felt to be related to propofol versus vasovagal syncope.  His TSH, EKG and  echocardiogram as well as troponins were stable/negative.  He was not on beta-blocker therapy.  Due to his decreased renal function it was felt that propofol may be contributing to his low heart rate.  At discharge his heart rate was improving.  Outpatient follow-up with cardiology was recommended.  He presented to the clinic 06/15/23 for follow-up evaluation and stated he continued to be dizzy since he had his fall.  He presented with his wife.  He was currently at a rehab facility.  He was doing physical therapy  activities 5 days/week.  He did have bilateral ear erythema and discharge from his right ear.  We reviewed his cardiac event  monitor.  His wife expressed understanding.  I recommended that [facility] and drainage.  I continued his medication regimen and planned follow-up in 3 to 4 months.  He was using 3 L of oxygen today via nasal cannula and saturating at 92%.  His blood pressure was well-controlled at 118/78 and his pulse was noted to be 64 bpm.  He was admitted to the hospital on 06/19/2023 and discharged on 06/25/2023.  During that time he underwent TEE in the setting of shock.  His LVEF was noted to be 50-55%, no regional wall motion abnormalities, right ventricle normal size, LA was normal, left atrial appendage showed no thrombus, right atrium normal, aortic valve no AI or AAS, trivial MR, trivial TR, mild PR, no PFO or ASD.  He was noted to have mild descending aortic plaque.  He required vasopressors, IV antibiotics, and IV diuresis.  He required intubation.  He was felt to possibly have urosepsis.  He was extubated and continued to progress.  Total diuresis was 10,690 mL.  He was discharged on 3 L nasal cannula.  Due to his deconditioning physical therapy evaluated and recommended home physical therapy at discharge.  He presents to the clinic today for follow-up evaluation and states after being discharged he was admitted to the [facility].  He then transition into [place] rehab.  He is now doing home rehab.  He had an echocardiogram done at the [place] which showed an EF of 50-55%, normal diastolic function, mild LVH, mildly reduced RV systolic function.  We reviewed his medications.  He continues on 3 L nasal cannula.  His pulse today is 50 and has blood pressure is 102/52.  I encouraged him to increase his p.o. hydration.  I will continue daily weights, continue his current medication regimen and plan follow-up in 6 months.  Today he denies chest pain, shortness of breath, lower extremity edema, fatigue, palpitations, melena, hematuria, hemoptysis, diaphoresis,  syncope, orthopnea, and PND.   Home Medications    Prior to Admission medications   Medication Sig Start Date End Date Taking? Authorizing Provider  albuterol (PROVENTIL HFA;VENTOLIN HFA) 108 (90 Base) MCG/ACT inhaler Inhale 2 puffs into the lungs every 6 (six) hours as needed for wheezing or shortness of breath.    [provider]  aspirin 81 MG chewable tablet Chew 1 tablet (81 mg total) by mouth 2 (two) times daily after a meal. Patient taking differently: Chew 81 mg by mouth in the morning and at bedtime. 02/14/17   Kathryne Hitch, MD  atorvastatin (LIPITOR) 80 MG tablet Take 80 mg by mouth at bedtime.    [provider]  cholecalciferol (VITAMIN D) 1000 UNITS tablet Take 1,000 Units by mouth daily.    [provider]  ferrous sulfate 325 (65 FE) MG tablet Take 325 mg by mouth daily.    [provider]  fluocinonide (LIDEX) 0.05 % external solution Apply 1 Application topically daily as needed (itchy rash on scalp).    [provider]  fluticasone (FLONASE) 50 MCG/ACT nasal spray Place 1-2 sprays into both nostrils in the morning. 07/25/18   [provider]  Fluticasone-Salmeterol (ADVAIR) 250-50 MCG/DOSE AEPB Inhale 1 puff into the lungs daily. 05/18/23   Leroy Sea, MD  gabapentin (NEURONTIN) 300 MG capsule Take 600 mg by mouth 3 (three) times daily.    [provider]  hydrocortisone 2.5 % cream Apply 1 Application topically 2 (two) times daily as needed (rash on face).    [provider]  insulin aspart (NOVOLOG) 100 UNIT/ML FlexPen Before each meal 3 times a day, 140-199 - 2 units, 200-250 - 6 units, 251-299 - 8 units,  300-349 - 10 units,  350 or above 14 units. 05/18/23   Leroy Sea, MD  insulin glargine (LANTUS) 100 UNIT/ML injection Inject 0.4 mLs (40 Units total) into the skin 2 (two) times daily. 05/18/23   Leroy Sea, MD  ketoconazole (NIZORAL) 2 % shampoo Apply 1 Application topically  every Monday, Wednesday, and Friday.    [provider]  OXYGEN Inhale 2-3 L/min into the lungs See admin instructions. 2 L/min continuous. May increase to 3 L/min as needed for shortness of breath.    [provider]  tamsulosin (FLOMAX) 0.4 MG CAPS capsule Take 0.4 mg by mouth at bedtime.    [provider]  Tiotropium Bromide Monohydrate (SPIRIVA RESPIMAT) 2.5 MCG/ACT AERS Inhale 2 each into the lungs daily as needed (wheezing, shortness of breath.).    [provider]    Family History    History reviewed. No pertinent family history. has no family status information on file.   Social History    Social History   Socioeconomic History   Marital status: Married    Spouse name: Not on file   Number of children: Not on file   Years of education: Not on file   Highest education level: Not on file  Occupational History   Not on file  Tobacco Use   Smoking status: Former    Current packs/day: 0.00    Average packs/day: 1 pack/day for 33.0 years (33.0 ttl pk-yrs)    Types: Cigarettes    Start date: 04/07/1967    Quit date: 04/06/2000    Years since quitting: 23.5   Smokeless tobacco: Never   Tobacco comments:    use to chew cigars  Substance and Sexual Activity   Alcohol use: Not Currently    Comment: rarely   Drug use: Never    Comment: patient states does not use illegal drugs   Sexual activity: Not on file  Other Topics Concern   Not on file  Social History Narrative   Not on file   Social Determinants of Health   Financial Resource Strain: Not on file  Food Insecurity: No Food Insecurity (05/06/2023)   Hunger Vital Sign    Worried About Running Out of Food in the Last Year: Never true    Ran Out of Food in the Last Year: Never true  Transportation Needs: No Transportation Needs (05/06/2023)   PRAPARE - Administrator, Civil Service (Medical): No    Lack of Transportation (Non-Medical): No  Physical Activity: Not on file   Stress: Not on file  Social Connections: Not on file  Intimate Partner Violence: Not At Risk (05/06/2023)   Humiliation, Afraid, Rape, and Kick questionnaire    Fear of Current or Ex-Partner: No    Emotionally Abused: No    Physically Abused: No    Sexually Abused: No     Review of Systems    General:  No chills, fever, night sweats or weight changes.  Cardiovascular:  No chest pain, dyspnea on exertion, edema, orthopnea, palpitations, paroxysmal nocturnal dyspnea. Dermatological: No rash, lesions/masses Respiratory: No cough, dyspnea Urologic: No hematuria, dysuria Abdominal:   No nausea, vomiting, diarrhea, bright red blood per rectum, melena, or hematemesis Neurologic:  No visual changes, wkns, changes in mental status. All other systems reviewed and are otherwise negative except as noted above.  Physical Exam    VS:  BP (!) 102/52   Pulse (!) 50   Ht 5\' 8"  (1.727 m)   Wt 213 lb 9.6 oz (96.9 kg)   SpO2 97%   BMI 32.48 kg/m  , BMI Body mass index is 32.48 kg/m. GEN: Well nourished, well developed, in no acute distress. HEENT: normal. Neck: Supple, no JVD, carotid bruits, or masses. Cardiac: RRR, no murmurs, rubs, or gallops. No clubbing, cyanosis, edema.  Radials/DP/PT 2+ and equal bilaterally.  Respiratory:  Respirations regular and unlabored, clear to auscultation bilaterally. GI: Soft, nontender, nondistended, BS + x 4. MS: no deformity or atrophy. Skin: warm and dry, no rash.  Neuro:  Strength and sensation are intact. Psych: Normal affect.  Accessory Clinical Findings    Recent Labs: 06/18/2023: B Natriuretic Peptide 54.0 06/19/2023: ALT 12; TSH 1.062 06/25/2023: BUN 30; Creatinine, Ser 1.51; Hemoglobin 11.5; Magnesium 2.5; Platelets 221; Potassium 4.0; Sodium 140   Recent Lipid Panel No results found for: "CHOL", "TRIG", "HDL", "CHOLHDL", "VLDL", "LDLCALC", "LDLDIRECT"       ECG personally reviewed by me today-none today.     Echocardiogram  05/11/2023   IMPRESSIONS     1. Left ventricular ejection fraction, by estimation, is 55 to 60%. The  left ventricle has normal function. The left ventricle has no regional  wall motion abnormalities. Left ventricular diastolic parameters are  indeterminate.   2. Right ventricular systolic function is mildly reduced. The right  ventricular size is normal.   3. The mitral valve is normal in structure. No evidence of mitral valve  regurgitation. No evidence of mitral stenosis. Moderate mitral annular  calcification.   4. The aortic valve was not well visualized. There is moderate  calcification of the aortic valve. Aortic valve regurgitation is not  visualized. No aortic stenosis is present.   5. The inferior vena cava is dilated in size with <50% respiratory  variability, suggesting right atrial pressure of 15 mmHg.   6. Very limited study due to poor sound wave transmission.   FINDINGS   Left Ventricle: Left ventricular ejection fraction, by estimation, is 55  to 60%. The left ventricle has normal function. The left ventricle has no  regional wall motion abnormalities. Definity contrast agent was given IV  to delineate the left ventricular   endocardial borders. The left ventricular internal cavity size was normal  in size. There is no left ventricular hypertrophy. Left ventricular  diastolic parameters are indeterminate.   Right Ventricle: The right ventricular size is normal. No increase in  right ventricular wall thickness. Right ventricular systolic function is  mildly reduced.   Left Atrium: Left atrial size was normal in size.   Right Atrium: Right atrial size  was normal in size.   Pericardium: There is no evidence of pericardial effusion.   Mitral Valve: The mitral valve is normal in structure. Moderate mitral  annular calcification. No evidence of mitral valve regurgitation. No  evidence of mitral valve stenosis.   Tricuspid Valve: The tricuspid valve is not well  visualized. Tricuspid  valve regurgitation is trivial. No evidence of tricuspid stenosis.   Aortic Valve: The aortic valve was not well visualized. There is moderate  calcification of the aortic valve. Aortic valve regurgitation is not  visualized. No aortic stenosis is present. Aortic valve mean gradient  measures 5.0 mmHg. Aortic valve peak  gradient measures 9.5 mmHg.   Pulmonic Valve: The pulmonic valve was not well visualized. Pulmonic valve  regurgitation is not visualized. No evidence of pulmonic stenosis.   Aorta: The aortic root is normal in size and structure.   Venous: The inferior vena cava is dilated in size with less than 50%  respiratory variability, suggesting right atrial pressure of 15 mmHg.   IAS/Shunts: No atrial level shunt detected by color flow Doppler.   TEE 06/21/2023  IMPRESSIONS     1. Left ventricular ejection fraction, by estimation, is 50 to 55%. The  left ventricle has low normal function.   2. Right ventricular systolic function is moderately reduced. The right  ventricular size is moderately enlarged.   3. No left atrial/left atrial appendage thrombus was detected.   4. Right atrial size was moderately dilated.   5. The mitral valve is normal in structure. Trivial mitral valve  regurgitation.   6. The aortic valve is tricuspid. Aortic valve regurgitation is trivial.   FINDINGS   Left Ventricle: Left ventricular ejection fraction, by estimation, is 50  to 55%. The left ventricle has low normal function. The left ventricular  internal cavity size was normal in size.   Right Ventricle: The right ventricular size is moderately enlarged. No  increase in right ventricular wall thickness. Right ventricular systolic  function is moderately reduced.   Left Atrium: Left atrial size was normal in size. No left atrial/left  atrial appendage thrombus was detected.   Right Atrium: Right atrial size was moderately dilated.   Pericardium: There is no  evidence of pericardial effusion.   Mitral Valve: The mitral valve is normal in structure. Trivial mitral  valve regurgitation.   Tricuspid Valve: The tricuspid valve is normal in structure. Tricuspid  valve regurgitation is mild.   Aortic Valve: The aortic valve is tricuspid. Aortic valve regurgitation is  trivial.   Pulmonic Valve: The pulmonic valve was grossly normal. Pulmonic valve  regurgitation is mild.   Aorta: The aortic root is normal in size and structure.   IAS/Shunts: The interatrial septum appears to be lipomatous. No atrial  level shunt detected by color flow Doppler.   Arvilla Meres MD  Electronically signed by Arvilla Meres MD  Signature Date/Time: 07/01/2023/7:02:32 PM    Assessment & Plan   1.  Acute on chronic diastolic CHF-weight 213.6today.  Euvolemic.  Patient is not a candidate for beta-blocker therapy due to syncope and bradycardia.  Has completed physical therapy at home.  Continue physical therapy activities and increase physical activity. Daily weights-contact office with a weight increase of 2 to 3 pounds overnight or 5 pounds in 1 week. Lower extremity support stockings Elevate lower extremities when not active Continue spironolactone, losartan, furosemide, Jardiance  Bradycardia-heart rate today 50.  Avoid AV nodal blocking agents.  Denies episodes of lightheadedness, presyncope or syncope. Continue  to monitor  Hyperlipidemia-reports compliance with atorvastatin.  Denies side effects. High-fiber diet Continue statin therapy  Type 2 diabetes-compliant with insulin. Follows with PCP  COPD-maintains on 3-4 L.  Breathing at baseline.   Continue current medical therapy Follows with PCP  CKD stage IIIa-creatinine 1.51 on 06/17/2023. Follows with PCP  Disposition: Follow-up with Dr. Jens Som or me in 6 months.   Thomasene Ripple. Victoriya Pol NP-C     10/16/2023, 1:53 PM [facility] [address] Office  [phone] Fax [phone]    I spent 14 minutes examining this patient, reviewing medications, and using patient centered shared decision making involving her cardiac care.  Prior to her visit I spent greater than 20 minutes reviewing her past medical history,  medications, and prior cardiac tests.

## 2023-10-16 ENCOUNTER — Ambulatory Visit: Payer: Medicare PPO | Attending: General Practice | Admitting: General Practice

## 2023-10-16 ENCOUNTER — Encounter: Payer: Self-pay | Admitting: General Practice

## 2023-10-16 VITALS — BP 102/52 | HR 50 | Ht 68.0 in | Wt 213.6 lb

## 2023-10-16 DIAGNOSIS — J438 Other emphysema: Secondary | ICD-10-CM | POA: Diagnosis not present

## 2023-10-16 DIAGNOSIS — E1169 Type 2 diabetes mellitus with other specified complication: Secondary | ICD-10-CM | POA: Diagnosis not present

## 2023-10-16 DIAGNOSIS — I5033 Acute on chronic diastolic (congestive) heart failure: Secondary | ICD-10-CM | POA: Diagnosis not present

## 2023-10-16 DIAGNOSIS — N1831 Chronic kidney disease, stage 3a: Secondary | ICD-10-CM

## 2023-10-16 DIAGNOSIS — E785 Hyperlipidemia, unspecified: Secondary | ICD-10-CM

## 2023-10-16 DIAGNOSIS — R001 Bradycardia, unspecified: Secondary | ICD-10-CM

## 2023-10-16 NOTE — Patient Instructions (Signed)
Medication Instructions:  No Changes  *If you need a refill on your cardiac medications before your next appointment, please call your pharmacy*   Lab Work: None ordered    Testing/Procedures: None needed    Follow-Up: At [facility], you and your health needs are our priority.  As part of our continuing mission to provide you with exceptional heart care, we have created designated Provider Care Teams.  These Care Teams include your primary Cardiologist (physician) and Advanced Practice Providers (APPs -  Physician Assistants and Nurse Practitioners) who all work together to provide you with the care you need, when you need it.  We recommend signing up for the patient portal called "MyChart".  Sign up information is provided on this After Visit Summary.  MyChart is used to connect with patients for Virtual Visits (Telemedicine).  Patients are able to view lab/test results, encounter notes, upcoming appointments, etc.  Non-urgent messages can be sent to your provider as well.   To learn more about what you can do with MyChart, go to [email or web address].    Your next appointment:   6 month(s)  Provider:   Edd Fabian, FNP        Other Instructions Increase oral Hydration more than 48 oz of water daily  Maintain physical activity  Continue daily weight s

## 2023-12-04 NOTE — Progress Notes (Signed)
 Diagnostic Cystoscopy  Performed by: Ranell Von Constable, MD Authorized by: Ranell Von Constable, MD     PLAN -  would recommend cystoscopy /TURBT / Bladder biopsies   Indications: Bladder cancer surveillance cystoscopy for HGTa s/p BCG in 2019 (last recurrence 06/2015) and L UTUC s/p left NephU in 2017.  Multiple serious medical co-morbidities    Surgeon: Ranell POUR. Hemal  Assistants: Christal  Procedure Details  The risks, benefits, complications, treatment options, and expected outcomes were discussed with the patient. The patient concurred with the proposed plan, giving informed consent.  Cystoscopy was performed today under local anesthesia, using sterile technique  following time out and identification. The patient was placed in the supine position, prepped with Betadine, and draped in the usual sterile fashion. A 17 French flexible cystoscope was used to inspect both the urethra and bladder.   Findings:    Urethra Normal meatus, penile and bulbar urethral lumen without strictures and without scarring.    Prostate  Prostatic urethra is normal with no evidence of stricture.   Bladder: Reddish flat patch in the left posterior wall  Ureteral orifices:      -Right Normal, effluxing clear urine    Complications: None; patient tolerated the procedure well.  Disposition: To home after 30 minute observation.  Condition: stable  Attending Attestation: I performed the procedure.  CT Urogram Result Date: 12/05/2023 CT UROGRAM, 12/05/2023 11:28 AM INDICATION: Malignant neoplasm of bladder, unspecified (CMD) \ C67.9 Malignant neoplasm of bladder, unspecified (CMD) COMPARISON: Multiple prior CT examinations including CT urography 12/07/2021. TECHNIQUE: CT images of the abdomen and pelvis were obtained prior to and after intravenous administration of iodinated contrast, per institution urogram protocol. Conventional axial reconstructions and multiplanar reformatted images were submitted for review.  FINDINGS: . Lower Chest: Coronary artery calcifications. Lower lung zone calcified nodules. Basilar atelectasis and scarring. SABRA RIGHT Kidney/Ureter: No hydronephrosis. No radiopaque stones. No suspicious masses. Multifocal renal cortical scarring with areas of volume loss/atrophy. SABRA LEFT Kidney/Ureter: Status post left nephroureterectomy. . Bladder: Please note that portions of the bladder, particularly the mid and lower aspects, are largely obscured by streak artifact from the left hip implant. There does appear to be, however, a focal area of irregular mural thickening at the left posterior bladder dome, reference images 119 through 121 of series 10. There is also some slight urothelial irregularity at this location noted on excretory phase imaging, reference images 123 through 129 of series 18. . Reproductive System: Unremarkable. . Adrenals: Unchanged mild thickening on the left. . Retroperitoneum: Scattered unchanged subcentimeter retroperitoneal lymph nodes. . Liver: Punctate calcification at the dome, favor granuloma. . Gallbladder/Biliary: Calcified cholelithiasis without additional signs of cholecystitis or biliary ductal dilatation. SABRA Spleen: Unremarkable. . Pancreas: Unremarkable. . Peritoneum/Mesenteries: Negative for ascites. Unchanged borderline lesser omental lymph nodes.. . Gastrointestinal tract: No evidence of obstruction. Colonic diverticulosis. . Vascular: Moderate calcific plaque burden throughout an otherwise nonaneurysmal abdominal aorta. Separate origins of the splenic and common hepatic arteries. . Musculoskeletal: No acute displaced fractures. Polyarticular degenerative changes. Left total hip arthroplasty. Degenerative changes throughout the visualized spine. Postoperative changes involving the mid ventral abdominal wall with significant myofascial thinning, without large full thickness defect. Subcutaneous changes in the bilateral aspects of the ventral abdominal wall, possibly the  sequela of chronic medication injection.   1. Focal mural thickening with slight urothelial irregularity in the posterior left bladder dome. This appears new from CT 12/07/2021 but is otherwise nonspecific. Advise correlation with cystoscopy. 2. Status post left nephroureterectomy. No evidence  of disease recurrence in the resection bed or retroperitoneal adenopathy. 3. No metachronous lesions are identified in the right upper tract. 4. Additional unchanged ancillary findings as noted above.

## 2024-01-05 NOTE — Consults (Signed)
 ------------------------------------------------------------------------------- Attestation signed by Jann Merlyn Soda, MD at 01/06/2024  4:32 PM I saw and evaluated the patient. I reviewed the trainee's note and agree. I performed the service or was physically present during the critical or key portions of the service furnished by the trainee; and I managed the patient.  Hima Merlyn Soda, MD 01/06/2024  -------------------------------------------------------------------------------  [facility] Endocrinology Consult Note   Preceptor: Jann Soda, MD Patient Name: Ricky Johnston MRN: [id number] Date of Consult: Fri 01/05/2024 Reason for Consult: Glycemic management Consulting Team: Urology  HPI: Ricky Johnston is a 73 y.o. 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  Endocrinology consulted for glycemic management.  Patient reports longstanding history of diabetes He is reportedly followed by [facility] provider Patient reports that his outpatient regimen includes U-500  He typically takes 100 units with breakfast, 125 units with lunch, and 15 to 50 units with dinner depending on his blood sugar Patient is also on Mounjaro  10 mg subcu weekly and did not take the dose this Sunday in anticipation of his procedure Patient is a Dexcom G7 to monitor his blood glucose levels He reports intermittent hypoglycemia particularly overnight in the fasting state This occurs sometimes weekly and then sometimes every few weeks Reports having symptoms of hypoglycemia Patient states that his most recent A1c was 7.6%  Patient was admitted status post TURPT procedure for postoperative pain control Received dexamethasone  4 mg in the OR on  01/04/2024   Review of Systems:  Complete ROS obtained and pertinent positives noted in the HPI  PMH, PSH, Social Hx, Family Hx, Meds and Allergies   I have independently reviewed the patient's past medical history, past surgical history, medication list, and social history noted below.   Past Medical History:  Diagnosis Date  . Anemia    mild; intermittent hematuria (Bladder CA); iron deficiency; Hgb 11.3 on 06/23/15  . Arthritis    hands  . Cancer (CMD)    bladder- followed by Dr. Tommi s/p Cysto with TURBT x 3; most recently 07/03/15  . CHF (congestive heart failure) (CMD)   . Chronic pain    abdominal pain (Bladder CA); followed by [facility]  . Colonic diverticulum    noted CT Abdomen 01/2015  . Constipation    Opioid induced  . COPD (chronic obstructive pulmonary disease) (CMD)    Dx 2010; Mod Severe; most recent resolved exacerbation 12/2014; improved resp status and off home oxygen 2 liters prn since then; followed by [facility] (pulmonology)      . Coronary artery disease    Cardiac Cath 2004 ([place])- clean coronary arteries; chest pain with ED visit and NM Stress Test 01/08/15 [facility] fixed defect involving the inferolateral wall; Left Heart Cath 01/09/15- no obstructive CAD; denies chest pain since then  . Diabetes mellitus (CMD)    Dx 2000; followed by [facility]; HgbA1c 8.1% on 01/08/15 and 7.0% in 05/2015; fasting glucose levels 90-130; previously on ACEI for cardiorenal protection but unable to tolerate (low BP)   . DVT (deep venous thrombosis) (CMD)    RUE 2010- due to traumatic peripheral IV cannulation on Coumadin x 6 months  . Enlarged prostate    noted CT abd 01/2015  . Gastric ulcer    history of; PPI for GI protection  . Heart murmur  since childhood   . HTN (hypertension) 10/13/2016  . Kidney stones    recurrent- requiring lithotripsy and nephrostomy tube in the past (last 2012)- Bactrim prn  . Lower urinary tract symptoms (LUTS)    Flomax  and Ditropan   . Morbid  obesity (CMD)    BMI 42.3  . Neuropathy   . OSA on CPAP   . RLS (restless legs syndrome)   . Systolic heart failure (CMD)    chronic- followed by Dr. Levern, [place]; EF 48% outside stress test 12/2014 with BNP 13.9 on 01/08/15  . Tobacco abuse    smoked 1-2 PPD x 26 yrs, quit 03/2000  . 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 Vitamin D  deficiency    Past Surgical History:  Procedure Laterality Date  . ANKLE ARTHROPLASTY     Procedure: ANKLE ARTHROPLASTY  . ANKLE ARTHROPLASTY Right 1995; 1997   Procedure: ANKLE ARTHROPLASTY  . BACK SURGERY     Procedure: BACK SURGERY; LS  . BLADDER SURGERY     Procedure: BLADDER SURGERY; 2010; 2013; 2015  . CARDIAC CATHETERIZATION  2004; 01/09/15   Procedure: CARDIAC CATHETERIZATION; no obstructive CAD  . CATARACT EXTRACTION    . CYSTOSCOPY     Procedure: CYSTOSCOPY  . CYSTOSCOPY N/A 07/19/2019   Procedure: CYSTOSCOPY;  Surgeon: Ranell Von Constable, MD;  Location: [facility] MAIN OR;  Service: Urology;  Laterality: N/A;  . CYSTOSCOPY W/ URETEROSCOPY Right 06/15/2018   Procedure: CYSTOSCOPY W/ URETEROSCOPY;  Surgeon: Ranell Von Constable, MD;  Location: [facility] MAIN OR;  Service: Urology;  Laterality: Right;  . CYSTOSTOMY W/ BLADDER BIOPSY N/A 02/09/2015   Procedure: BLADDER BIOPSY;  Surgeon: Ranell Von Constable, MD;  Location[facility] OUTPATIENT OR;  Service: Urology;  Laterality: N/A;  . CYSTOSTOMY W/ BLADDER BIOPSY N/A 11/14/2016   Procedure: BLADDER BIOPSY;  Surgeon: Ranell Von Constable, MD;  Location: [facility] MAIN OR;  Service: Urology;  Laterality: N/A;  . CYSTOSTOMY W/ BLADDER BIOPSY N/A 07/19/2019   Procedure: BLADDER BIOPSY;  Surgeon: Ranell Von Constable, MD;  Location: [facility] MAIN OR;  Service: Urology;  Laterality: N/A;  . EXAMINATION UNDER ANESTHESIA N/A 02/09/2015   Procedure: EXAM UNDER ANESTHESIA (EUA);  Surgeon: Ranell Von Constable, MD;  Location: [facility] OUTPATIENT OR;  Service: Urology;  Laterality: N/A;  . EYE SURGERY     Procedure: EYE SURGERY; glass  removed both eyes 1975  . FOOT FRACTURE SURGERY Left    Procedure: FOOT FRACTURE SURGERY  . FOOT NEUROMA SURGERY Left    Procedure: FOOT NEUROMA SURGERY  . HERNIA REPAIR  2003   Procedure: HERNIA REPAIR  . INTRARENAL STONE MANIPULATION/REMOVAL Left 08/07/2015   Procedure: Left URETEROSCOPY,  LASER ABLATION;  Surgeon: Ranell Von Constable, MD;  Location: [facility] MAIN OR;  Service: Urology;  Laterality: Left;  . KIDNEY STONE SURGERY     Procedure: KIDNEY STONE SURGERY  . LITHOTRIPSY     Procedure: LITHOTRIPSY; 3 episode, with nephrostomy tube  . NEPHRECTOMY Left 03/18/2016   Procedure: NEPHROURETERECTOMY W/ EXCISION BLADDER CUFF & REGIOINAL LYMPHADENECTOMY ROBOTIC  /CYSTOTOMY REPAIR;  Surgeon: Ranell Von Constable, MD;  Location: [facility] MAIN OR;  Service: Urology;  Laterality: Left;  . TRANSURETHRAL RESECTION OF BLADDER TUMOR N/A 02/09/2015   Procedure: CYSTOSCOPY W/ TURBT;  Surgeon: Ranell Von Constable, MD;  Location: [facility] OUTPATIENT OR;  Service: Urology;  Laterality: N/A;  . TRANSURETHRAL RESECTION OF BLADDER TUMOR Bilateral 07/03/2015   Procedure: CYSTOSCOPY W/ TURBT;  Surgeon: Ranell Von Constable, MD;  Location: [facility] MAIN OR;  Service: Urology;  Laterality: Bilateral;  RESECTION OF TUMOR NEAR BILATERAL URETERAL OPENINGS   . TRANSURETHRAL RESECTION OF BLADDER TUMOR N/A 03/02/2015   Procedure: TRANSURETHRAL RESECTION BLADDER TUMOR;  Surgeon: Ranell Von Constable, MD;  Location: [facility] MAIN OR;  Service: Urology;  Laterality: N/A;  . TRANSURETHRAL RESECTION OF BLADDER TUMOR N/A 08/07/2015   Procedure: CYSTOSCOPY ;  Surgeon: Ranell Von Constable, MD;  Location: [facility] MAIN OR;  Service: Urology;  Laterality: N/A;  . TRANSURETHRAL RESECTION OF BLADDER TUMOR N/A 11/14/2016   Procedure: CYSTOSCOPY w/ BLADDER BIOPSY;  Surgeon: Ranell Von Constable, MD;  Location: [facility] MAIN OR;  Service: Urology;  Laterality: N/A;  . TRANSURETHRAL RESECTION OF BLADDER TUMOR N/A 12/07/2018   Procedure: CYSTOSCOPY W/ TURBT SMALL;  Surgeon: Ranell Von Constable, MD;   Location: [facility] MAIN OR;  Service: Urology;  Laterality: N/A;  . URETEROSCOPY Bilateral 07/03/2015   Procedure: URETEROSCOPY;  Surgeon: Ranell Von Constable, MD;  Location: [facility] MAIN OR;  Service: Urology;  Laterality: Bilateral;  WITH URETERAL TUMOR LASER ABLATION   . URETEROSCOPY Left 03/02/2015   Procedure: URETEROSCOPY laser ablation of uteral tumor;  Surgeon: Ranell Von Constable, MD;  Location: [facility] MAIN OR;  Service: Urology;  Laterality: Left;  . URETEROSCOPY Right 11/14/2016   Procedure: URETEROSCOPY , LASER;  Surgeon: Ranell Von Constable, MD;  Location: [facility] MAIN OR;  Service: Urology;  Laterality: Right;  . URETEROSCOPY Right 12/07/2018   Procedure: URETEROSCOPY;  Surgeon: Ranell Von Constable, MD;  Location: [facility] MAIN OR;  Service: Urology;  Laterality: Right;  . URETEROSCOPY Right 07/19/2019   Procedure: URETEROSCOPY / RIGHT URETEROSCOPY AND FULGURATION;  Surgeon: Ranell Von Constable, MD;  Location: [facility] MAIN OR;  Service: Urology;  Laterality: Right;   Family History  Problem Relation Name Age of Onset  . Stroke Mother    . Heart disease Mother    . Diabetes Mother    . Parkinsonism Sister    . Anesthesia problems Neg Hx     Social History   Tobacco Use  . Smoking status: Former    Current packs/day: 0.00    Types: Cigarettes    Quit date: 11/29/1999    Years since quitting: 24.1  . Smokeless tobacco: Former    Quit date: 11/28/1992  Substance Use Topics  . Alcohol use: No  . Drug use: Not Currently    Comment: Drug use: Drug Use: No; incorrectly documented - uses only when prescribed   Medications: Current Facility-Administered Medications Ordered in Epic  Medication Dose Route Frequency Provider Last Rate Last Admin  . acetaminophen  (TYLENOL ) tablet 325 mg  325 mg oral Q6H Maxwell Louis Sandberg, MD   325 mg at 01/05/24 0426  . dextrose  (D50W) 50 % injection 12.5 g  12.5 g intravenous PRN Bryan Morton Maine, MD      . dextrose  (TRUEPLUS GLUCOSE) 15 gram/32 mL oral gel 15 g  15 g oral PRN  Bryan Morton Maine, MD      . insulin  glargine (LANTUS ) injection 50 Units  50 Units subcutaneous At Bedtime [facility], DO   50 Units at 01/05/24 0110  . insulin  lispro (HumaLOG) injection 0-15 Units  0-15 Units subcutaneous Q4H Alisha Brooke Marquez, DO   9 Units at 01/05/24 0809  . ondansetron  (ZOFRAN ) injection 4 mg  4 mg intravenous Q6H PRN Bryan Morton Maine, MD      . prochlorperazine (COMPAZINE) injection 5 mg  5 mg intravenous Q6H PRN Bryan Morton Maine, MD      .  trospium (SANCTURA) tablet 20 mg  20 mg oral BID PRN Bryan Morton Maine, MD       Meds Ordered in [place]  Medication Sig Dispense Refill  . traMADoL  (ULTRAM ) 50 mg tablet Take 1 tablet (50 mg total) by mouth every 6 (six) hours as needed for moderate pain (4-6). 12 tablet 0  . trospium (SANCTURA) 20 mg tablet Take 1 tablet (20 mg total) by mouth in the morning and 1 tablet (20 mg total) in the evening. Take before meals. Do all this for 7 days. 14 tablet 0   Allergies: Allergies  Allergen Reactions  . Poison Ivy Extract Rash  . Poison Oak Extract Rash    Objective Data  Vital Signs: BP 119/63 (BP Location: Right arm, Patient Position: Sitting)   Pulse 60   Temp 97.7 F (36.5 C) (Axillary)   Resp 17   SpO2 95%    Physical Exam: GENERAL: Alert, well-hydrated, well-nourished, and in no acute distress HEENT: MMM., nasal cannula in bilateral nares THYROID: No visible goiter CARDIOVASCULAR: RRR, normal S1, S2, with no murmurs, rubs or gallops RESPIRATORY: Normal respiratory effort, lungs clear to auscultation bilaterally ABDOMEN: Soft, nontender, nondistended, BS present EXTREMITIES: Lower extremities warm, well perfused, no edema noted MSK: No deficits noted SKIN: No rashes, wounds, nail or skin changes on exposed skin NEURO: CNs II-XII grossly intact, alert, aware, answers questions appropriately PSYCH: Pleasant mood  Pertinent Labs:  Lab Results  Component Value Date/Time   WBC  4.60 01/04/2024 2349   RBC 4.45 (L) 01/04/2024 2349   HGB 13.0 (L) 01/04/2024 2349   HCT 40.5 (L) 01/04/2024 2349   PLT 141 (L) 01/04/2024 2349    Lab Results  Component Value Date/Time   NA 134 (L) 01/04/2024 2349   K 5.1 01/05/2024 0612   CL 96 (L) 01/04/2024 2349   CO2 30 01/04/2024 2349   BUN 31 (H) 01/04/2024 2349   CREATININE 1.60 (H) 01/04/2024 2349    Lab Results  Component Value Date   HGBA1C 7.3 (H) 07/12/2019     Assessment and Plan   Ricky Johnston is a 73 y.o.  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  Endocrinology consulted for glycemic management.  Type 2 diabetes mellitus Steroid-induced hyperglycemia: Patient with a history of type 2 diabetes managed with concentrated U-500 insulin  in the outpatient setting and Mounjaro  who is currently admitted postoperatively status post TURBT.  Significant component of steroid-induced hyperglycemia with marked elevation in blood glucose level noted yesterday evening.  Patient received dexamethasone  4 mg IV in the OR yesterday.  Patient has downtrending blood glucose levels with initiation of basal/bolus subcu MDI insulin  regimen.  - Insulin  regimen as follows (changes in bold):  - Basal: Lantus  50 units HS  - Prandial: Initiate Humalog 10 units TIDPC  - Correction: Humalog correction 0-15 units TIDPC - FSBG ACHS - Inpatient blood glucose target: 140-180 - Hypoglycemia protocol in place - Diet: Recommend adult Medium CC modifier   DISCHARGE RECOMMENDATIONS - Reviewed outpatient blood glucose targets with patient - Encouraged continued close blood glucose monitoring with meals, at bedtime, and with symptoms of hyper or hypoglycemia -  Recommend continued utilization of CGM for blood glucose monitoring and insulin  dose titration. - Recommend the following discharge regimen:  - Based on limited data  available, reasonable to restart home regimen of U-500 and have close interval follow-up without patient endocrinologist  - Based on minimal lapse in exposure to GLP-1 RA reasonable to restart at same dose of Mounjaro  10 mg subcu weekly   Plan of care discussed with patient/patient's family, bedside nursing, and primary team.    Thank you for this interesting consultation and allowing us  to participate in this patient's care. Please call/page with any questions/concerns.   Plan of care was discussed with attending physician, Dr. Jann Soda, MD.  Electronically signed by: Russell Jobs Bagett, DO 01/05/2024 8:34 AM  A. Lyle Mate, DO Endocrinology and Metabolism Fellow PGY-VI

## 2024-04-03 ENCOUNTER — Inpatient Hospital Stay (HOSPITAL_COMMUNITY)
Admission: EM | Admit: 2024-04-03 | Discharge: 2024-04-09 | DRG: 871 | Disposition: A | Discharge status: Skilled Nursing Facility | Attending: Family Medicine | Admitting: Family Medicine

## 2024-04-03 ENCOUNTER — Other Ambulatory Visit: Payer: Self-pay

## 2024-04-03 ENCOUNTER — Inpatient Hospital Stay (HOSPITAL_COMMUNITY)

## 2024-04-03 ENCOUNTER — Emergency Department (HOSPITAL_COMMUNITY)

## 2024-04-03 DIAGNOSIS — N179 Acute kidney failure, unspecified: Principal | ICD-10-CM | POA: Diagnosis present

## 2024-04-03 DIAGNOSIS — E11649 Type 2 diabetes mellitus with hypoglycemia without coma: Secondary | ICD-10-CM | POA: Diagnosis present

## 2024-04-03 DIAGNOSIS — Z87891 Personal history of nicotine dependence: Secondary | ICD-10-CM

## 2024-04-03 DIAGNOSIS — I700 Atherosclerosis of aorta: Secondary | ICD-10-CM | POA: Diagnosis present

## 2024-04-03 DIAGNOSIS — J9622 Acute and chronic respiratory failure with hypercapnia: Secondary | ICD-10-CM | POA: Diagnosis not present

## 2024-04-03 DIAGNOSIS — H9190 Unspecified hearing loss, unspecified ear: Secondary | ICD-10-CM | POA: Diagnosis present

## 2024-04-03 DIAGNOSIS — Z1152 Encounter for screening for COVID-19: Secondary | ICD-10-CM | POA: Diagnosis not present

## 2024-04-03 DIAGNOSIS — E8729 Other acidosis: Secondary | ICD-10-CM | POA: Diagnosis present

## 2024-04-03 DIAGNOSIS — I10 Essential (primary) hypertension: Secondary | ICD-10-CM | POA: Diagnosis present

## 2024-04-03 DIAGNOSIS — Z96642 Presence of left artificial hip joint: Secondary | ICD-10-CM | POA: Diagnosis present

## 2024-04-03 DIAGNOSIS — I130 Hypertensive heart and chronic kidney disease with heart failure and stage 1 through stage 4 chronic kidney disease, or unspecified chronic kidney disease: Secondary | ICD-10-CM | POA: Diagnosis present

## 2024-04-03 DIAGNOSIS — E785 Hyperlipidemia, unspecified: Secondary | ICD-10-CM | POA: Diagnosis present

## 2024-04-03 DIAGNOSIS — Z6841 Body Mass Index (BMI) 40.0 and over, adult: Secondary | ICD-10-CM | POA: Diagnosis not present

## 2024-04-03 DIAGNOSIS — Z87442 Personal history of urinary calculi: Secondary | ICD-10-CM

## 2024-04-03 DIAGNOSIS — J9811 Atelectasis: Secondary | ICD-10-CM | POA: Diagnosis present

## 2024-04-03 DIAGNOSIS — I959 Hypotension, unspecified: Secondary | ICD-10-CM | POA: Diagnosis present

## 2024-04-03 DIAGNOSIS — Z7985 Long-term (current) use of injectable non-insulin antidiabetic drugs: Secondary | ICD-10-CM

## 2024-04-03 DIAGNOSIS — E1169 Type 2 diabetes mellitus with other specified complication: Secondary | ICD-10-CM | POA: Diagnosis present

## 2024-04-03 DIAGNOSIS — N320 Bladder-neck obstruction: Secondary | ICD-10-CM | POA: Diagnosis present

## 2024-04-03 DIAGNOSIS — L89152 Pressure ulcer of sacral region, stage 2: Secondary | ICD-10-CM | POA: Diagnosis present

## 2024-04-03 DIAGNOSIS — G928 Other toxic encephalopathy: Secondary | ICD-10-CM | POA: Diagnosis present

## 2024-04-03 DIAGNOSIS — I5022 Chronic systolic (congestive) heart failure: Secondary | ICD-10-CM | POA: Diagnosis present

## 2024-04-03 DIAGNOSIS — Z7409 Other reduced mobility: Secondary | ICD-10-CM | POA: Diagnosis present

## 2024-04-03 DIAGNOSIS — D631 Anemia in chronic kidney disease: Secondary | ICD-10-CM | POA: Diagnosis present

## 2024-04-03 DIAGNOSIS — N390 Urinary tract infection, site not specified: Secondary | ICD-10-CM | POA: Diagnosis present

## 2024-04-03 DIAGNOSIS — Z9109 Other allergy status, other than to drugs and biological substances: Secondary | ICD-10-CM

## 2024-04-03 DIAGNOSIS — I2510 Atherosclerotic heart disease of native coronary artery without angina pectoris: Secondary | ICD-10-CM | POA: Diagnosis present

## 2024-04-03 DIAGNOSIS — E874 Mixed disorder of acid-base balance: Secondary | ICD-10-CM | POA: Diagnosis present

## 2024-04-03 DIAGNOSIS — C679 Malignant neoplasm of bladder, unspecified: Secondary | ICD-10-CM | POA: Diagnosis present

## 2024-04-03 DIAGNOSIS — B952 Enterococcus as the cause of diseases classified elsewhere: Secondary | ICD-10-CM | POA: Diagnosis present

## 2024-04-03 DIAGNOSIS — Z9981 Dependence on supplemental oxygen: Secondary | ICD-10-CM

## 2024-04-03 DIAGNOSIS — N1832 Chronic kidney disease, stage 3b: Secondary | ICD-10-CM | POA: Diagnosis present

## 2024-04-03 DIAGNOSIS — Z515 Encounter for palliative care: Secondary | ICD-10-CM | POA: Diagnosis not present

## 2024-04-03 DIAGNOSIS — I4891 Unspecified atrial fibrillation: Secondary | ICD-10-CM | POA: Diagnosis present

## 2024-04-03 DIAGNOSIS — J9600 Acute respiratory failure, unspecified whether with hypoxia or hypercapnia: Secondary | ICD-10-CM

## 2024-04-03 DIAGNOSIS — Z7951 Long term (current) use of inhaled steroids: Secondary | ICD-10-CM

## 2024-04-03 DIAGNOSIS — G2581 Restless legs syndrome: Secondary | ICD-10-CM | POA: Diagnosis present

## 2024-04-03 DIAGNOSIS — A419 Sepsis, unspecified organism: Principal | ICD-10-CM

## 2024-04-03 DIAGNOSIS — D630 Anemia in neoplastic disease: Secondary | ICD-10-CM | POA: Diagnosis present

## 2024-04-03 DIAGNOSIS — Z7901 Long term (current) use of anticoagulants: Secondary | ICD-10-CM

## 2024-04-03 DIAGNOSIS — E1122 Type 2 diabetes mellitus with diabetic chronic kidney disease: Secondary | ICD-10-CM | POA: Diagnosis present

## 2024-04-03 DIAGNOSIS — R6520 Severe sepsis without septic shock: Secondary | ICD-10-CM

## 2024-04-03 DIAGNOSIS — Z7189 Other specified counseling: Secondary | ICD-10-CM | POA: Diagnosis not present

## 2024-04-03 DIAGNOSIS — K5641 Fecal impaction: Secondary | ICD-10-CM | POA: Diagnosis present

## 2024-04-03 DIAGNOSIS — J8410 Pulmonary fibrosis, unspecified: Secondary | ICD-10-CM | POA: Diagnosis present

## 2024-04-03 DIAGNOSIS — Z79899 Other long term (current) drug therapy: Secondary | ICD-10-CM

## 2024-04-03 DIAGNOSIS — Z794 Long term (current) use of insulin: Secondary | ICD-10-CM

## 2024-04-03 DIAGNOSIS — E1165 Type 2 diabetes mellitus with hyperglycemia: Secondary | ICD-10-CM | POA: Diagnosis present

## 2024-04-03 DIAGNOSIS — Z905 Acquired absence of kidney: Secondary | ICD-10-CM

## 2024-04-03 DIAGNOSIS — N289 Disorder of kidney and ureter, unspecified: Secondary | ICD-10-CM

## 2024-04-03 DIAGNOSIS — C689 Malignant neoplasm of urinary organ, unspecified: Secondary | ICD-10-CM | POA: Diagnosis present

## 2024-04-03 DIAGNOSIS — Z7982 Long term (current) use of aspirin: Secondary | ICD-10-CM

## 2024-04-03 DIAGNOSIS — N1831 Chronic kidney disease, stage 3a: Secondary | ICD-10-CM | POA: Diagnosis present

## 2024-04-03 DIAGNOSIS — J9621 Acute and chronic respiratory failure with hypoxia: Secondary | ICD-10-CM | POA: Diagnosis not present

## 2024-04-03 DIAGNOSIS — Z91199 Patient's noncompliance with other medical treatment and regimen due to unspecified reason: Secondary | ICD-10-CM

## 2024-04-03 DIAGNOSIS — K8020 Calculus of gallbladder without cholecystitis without obstruction: Secondary | ICD-10-CM | POA: Diagnosis present

## 2024-04-03 DIAGNOSIS — Z888 Allergy status to other drugs, medicaments and biological substances status: Secondary | ICD-10-CM

## 2024-04-03 DIAGNOSIS — E7800 Pure hypercholesterolemia, unspecified: Secondary | ICD-10-CM | POA: Diagnosis present

## 2024-04-03 DIAGNOSIS — J449 Chronic obstructive pulmonary disease, unspecified: Secondary | ICD-10-CM | POA: Diagnosis present

## 2024-04-03 DIAGNOSIS — K219 Gastro-esophageal reflux disease without esophagitis: Secondary | ICD-10-CM | POA: Diagnosis present

## 2024-04-03 DIAGNOSIS — G4733 Obstructive sleep apnea (adult) (pediatric): Secondary | ICD-10-CM | POA: Diagnosis present

## 2024-04-03 LAB — COMPREHENSIVE METABOLIC PANEL WITH GFR
ALT: 18 U/L (ref 0–44)
AST: 21 U/L (ref 15–41)
Albumin: 2.6 g/dL — ABNORMAL LOW (ref 3.5–5.0)
Alkaline Phosphatase: 75 U/L (ref 38–126)
Anion gap: 11 (ref 5–15)
BUN: 52 mg/dL — ABNORMAL HIGH (ref 8–23)
CO2: 29 mmol/L (ref 22–32)
Calcium: 8.5 mg/dL — ABNORMAL LOW (ref 8.9–10.3)
Chloride: 94 mmol/L — ABNORMAL LOW (ref 98–111)
Creatinine, Ser: 2.87 mg/dL — ABNORMAL HIGH (ref 0.61–1.24)
GFR, Estimated: 23 mL/min — ABNORMAL LOW (ref >60)
Glucose, Bld: 213 mg/dL — ABNORMAL HIGH (ref 70–99)
Potassium: 5.2 mmol/L — ABNORMAL HIGH (ref 3.5–5.1)
Sodium: 134 mmol/L — ABNORMAL LOW (ref 135–145)
Total Bilirubin: 1.3 mg/dL — ABNORMAL HIGH (ref 0.0–1.2)
Total Protein: 6.8 g/dL (ref 6.5–8.1)

## 2024-04-03 LAB — CBC WITH DIFFERENTIAL/PLATELET
Abs Immature Granulocytes: 0.3 10*3/uL — ABNORMAL HIGH (ref 0.00–0.07)
Band Neutrophils: 6 %
Basophils Absolute: 0.1 10*3/uL (ref 0.0–0.1)
Basophils Relative: 1 %
Eosinophils Absolute: 0.1 10*3/uL (ref 0.0–0.5)
Eosinophils Relative: 1 %
HCT: 41.3 % (ref 39.0–52.0)
Hemoglobin: 12 g/dL — ABNORMAL LOW (ref 13.0–17.0)
Lymphocytes Relative: 3 %
Lymphs Abs: 0.2 10*3/uL — ABNORMAL LOW (ref 0.7–4.0)
MCH: 27.8 pg (ref 26.0–34.0)
MCHC: 29.1 g/dL — ABNORMAL LOW (ref 30.0–36.0)
MCV: 95.8 fL (ref 80.0–100.0)
Metamyelocytes Relative: 5 %
Monocytes Absolute: 0.3 10*3/uL (ref 0.1–1.0)
Monocytes Relative: 4 %
Neutro Abs: 5.5 10*3/uL (ref 1.7–7.7)
Neutrophils Relative %: 80 %
Platelets: 117 10*3/uL — ABNORMAL LOW (ref 150–400)
RBC: 4.31 MIL/uL (ref 4.22–5.81)
RDW: 14.3 % (ref 11.5–15.5)
WBC: 6.4 10*3/uL (ref 4.0–10.5)
nRBC: 0 % (ref 0.0–0.2)

## 2024-04-03 LAB — URINALYSIS, ROUTINE W REFLEX MICROSCOPIC
Bilirubin Urine: NEGATIVE
Glucose, UA: 50 mg/dL — AB
Ketones, ur: NEGATIVE mg/dL
Nitrite: NEGATIVE
Protein, ur: 100 mg/dL — AB
RBC / HPF: >50 RBC/hpf (ref 0–5)
Specific Gravity, Urine: 1.014 (ref 1.005–1.030)
WBC, UA: >50 WBC/hpf (ref 0–5)
pH: 5 (ref 5.0–8.0)

## 2024-04-03 LAB — HEMOGLOBIN A1C
Hgb A1c MFr Bld: 7.8 % — ABNORMAL HIGH (ref 4.8–5.6)
Mean Plasma Glucose: 177.16 mg/dL

## 2024-04-03 LAB — BLOOD GAS, VENOUS
Acid-Base Excess: 3.2 mmol/L — ABNORMAL HIGH (ref 0.0–2.0)
Bicarbonate: 31.5 mmol/L — ABNORMAL HIGH (ref 20.0–28.0)
Drawn by: 57472
O2 Saturation: 98 %
Patient temperature: 36.9
pCO2, Ven: 64 mmHg — ABNORMAL HIGH (ref 44–60)
pH, Ven: 7.3 (ref 7.25–7.43)
pO2, Ven: 78 mmHg — ABNORMAL HIGH (ref 32–45)

## 2024-04-03 LAB — LACTIC ACID, PLASMA
Lactic Acid, Venous: 0.9 mmol/L (ref 0.5–1.9)
Lactic Acid, Venous: 1 mmol/L (ref 0.5–1.9)
Lactic Acid, Venous: 1 mmol/L (ref 0.5–1.9)

## 2024-04-03 LAB — PHOSPHORUS: Phosphorus: 5.5 mg/dL — ABNORMAL HIGH (ref 2.5–4.6)

## 2024-04-03 LAB — TROPONIN I (HIGH SENSITIVITY)
Troponin I (High Sensitivity): 26 ng/L — ABNORMAL HIGH (ref <18)
Troponin I (High Sensitivity): 23 ng/L — ABNORMAL HIGH (ref <18)

## 2024-04-03 LAB — PROTIME-INR
INR: 1.8 — ABNORMAL HIGH (ref 0.8–1.2)
Prothrombin Time: 21.4 s — ABNORMAL HIGH (ref 11.4–15.2)

## 2024-04-03 LAB — RESP PANEL BY RT-PCR (RSV, FLU A&B, COVID)  RVPGX2
Influenza A by PCR: NEGATIVE
Influenza B by PCR: NEGATIVE
Resp Syncytial Virus by PCR: NEGATIVE
SARS Coronavirus 2 by RT PCR: NEGATIVE

## 2024-04-03 LAB — PROCALCITONIN: Procalcitonin: 14.47 ng/mL

## 2024-04-03 LAB — MAGNESIUM: Magnesium: 1.9 mg/dL (ref 1.7–2.4)

## 2024-04-03 LAB — CBG MONITORING, ED: Glucose-Capillary: 181 mg/dL — ABNORMAL HIGH (ref 70–99)

## 2024-04-03 LAB — BRAIN NATRIURETIC PEPTIDE: B Natriuretic Peptide: 153 pg/mL — ABNORMAL HIGH (ref 0.0–100.0)

## 2024-04-03 LAB — APTT: aPTT: 47 s — ABNORMAL HIGH (ref 24–36)

## 2024-04-03 MED ORDER — LACTATED RINGERS IV BOLUS (SEPSIS): 1000.0000 mL | Freq: Once | INTRAVENOUS | Status: DC

## 2024-04-03 MED ORDER — HYDROMORPHONE HCL 1 MG/ML IJ SOLN: 0.5000 mg | INTRAMUSCULAR | Status: DC | PRN

## 2024-04-03 MED ORDER — TIOTROPIUM BROMIDE MONOHYDRATE 2.5 MCG/ACT IN AERS: 2.0000 | INHALATION_SPRAY | Freq: Every day | RESPIRATORY_TRACT | Status: DC | PRN

## 2024-04-03 MED ORDER — KETOCONAZOLE 2 % EX SHAM
1.0000 | MEDICATED_SHAMPOO | CUTANEOUS | Status: DC
  Administered 2024-04-08: 1 via TOPICAL
  Filled 2024-04-03: qty 120

## 2024-04-03 MED ORDER — LACTATED RINGERS IV BOLUS (SEPSIS): 500.0000 mL | Freq: Once | INTRAVENOUS | Status: DC

## 2024-04-03 MED ORDER — LACTATED RINGERS IV SOLN: 150.0000 mL/h | INTRAVENOUS | Status: DC

## 2024-04-03 MED ORDER — SODIUM CHLORIDE 0.9 % IV SOLN
1.0000 g | Freq: Once | INTRAVENOUS | Status: AC
  Administered 2024-04-03: 1 g via INTRAVENOUS
  Filled 2024-04-03: qty 10

## 2024-04-03 MED ORDER — SODIUM CHLORIDE 0.9 % IV BOLUS
500.0000 mL | Freq: Once | INTRAVENOUS | Status: AC
  Administered 2024-04-03: 500 mL via INTRAVENOUS

## 2024-04-03 MED ORDER — LACTATED RINGERS IV BOLUS: 500.0000 mL | Freq: Once | INTRAVENOUS | Status: DC

## 2024-04-03 MED ORDER — ONDANSETRON HCL 4 MG PO TABS: 4.0000 mg | ORAL_TABLET | Freq: Four times a day (QID) | ORAL | Status: DC | PRN

## 2024-04-03 MED ORDER — GABAPENTIN 300 MG PO CAPS
600.0000 mg | ORAL_CAPSULE | Freq: Two times a day (BID) | ORAL | Status: DC
  Administered 2024-04-03 – 2024-04-04 (×2): 600 mg via ORAL
  Filled 2024-04-03 (×2): qty 2

## 2024-04-03 MED ORDER — SODIUM CHLORIDE 0.9 % IV SOLN
2.0000 g | INTRAVENOUS | Status: DC
  Administered 2024-04-04: 2 g via INTRAVENOUS
  Filled 2024-04-03: qty 20

## 2024-04-03 MED ORDER — ONDANSETRON HCL 4 MG/2ML IJ SOLN: 4.0000 mg | Freq: Four times a day (QID) | INTRAMUSCULAR | Status: DC | PRN

## 2024-04-03 MED ORDER — SENNOSIDES-DOCUSATE SODIUM 8.6-50 MG PO TABS: 1.0000 | ORAL_TABLET | Freq: Every evening | ORAL | Status: DC | PRN

## 2024-04-03 MED ORDER — OXYCODONE HCL 5 MG PO TABS
5.0000 mg | ORAL_TABLET | ORAL | Status: DC | PRN
  Administered 2024-04-06: 5 mg via ORAL
  Filled 2024-04-03: qty 1

## 2024-04-03 MED ORDER — ASPIRIN 81 MG PO CHEW: 81.0000 mg | CHEWABLE_TABLET | Freq: Two times a day (BID) | ORAL | Status: DC

## 2024-04-03 MED ORDER — INSULIN ASPART 100 UNIT/ML IJ SOLN
0.0000 [IU] | Freq: Three times a day (TID) | INTRAMUSCULAR | Status: DC
  Administered 2024-04-03: 4 [IU] via SUBCUTANEOUS
  Administered 2024-04-04: 11 [IU] via SUBCUTANEOUS
  Administered 2024-04-04: 4 [IU] via SUBCUTANEOUS
  Administered 2024-04-04: 3 [IU] via SUBCUTANEOUS
  Administered 2024-04-06 – 2024-04-07 (×5): 4 [IU] via SUBCUTANEOUS
  Administered 2024-04-07: 7 [IU] via SUBCUTANEOUS
  Administered 2024-04-08 – 2024-04-09 (×3): 4 [IU] via SUBCUTANEOUS
  Filled 2024-04-03: qty 1

## 2024-04-03 MED ORDER — HYDRALAZINE HCL 20 MG/ML IJ SOLN: 10.0000 mg | INTRAMUSCULAR | Status: DC | PRN

## 2024-04-03 MED ORDER — SODIUM CHLORIDE 0.9 % IV SOLN: INTRAVENOUS | Status: DC

## 2024-04-03 MED ORDER — FLUTICASONE FUROATE-VILANTEROL 200-25 MCG/ACT IN AEPB
1.0000 | INHALATION_SPRAY | Freq: Every day | RESPIRATORY_TRACT | Status: DC
  Administered 2024-04-04 – 2024-04-09 (×6): 1 via RESPIRATORY_TRACT
  Filled 2024-04-03 (×2): qty 28

## 2024-04-03 MED ORDER — FUROSEMIDE 20 MG PO TABS: 20.0000 mg | ORAL_TABLET | Freq: Every day | ORAL | Status: DC

## 2024-04-03 MED ORDER — TAMSULOSIN HCL 0.4 MG PO CAPS: 0.4000 mg | ORAL_CAPSULE | Freq: Every day | ORAL | Status: DC

## 2024-04-03 MED ORDER — INSULIN REGULAR HUMAN (CONC) 500 UNIT/ML ~~LOC~~ SOPN
50.0000 [IU] | PEN_INJECTOR | SUBCUTANEOUS | Status: DC
Start: 2024-04-03 — End: 2024-04-03

## 2024-04-03 MED ORDER — IPRATROPIUM BROMIDE 0.02 % IN SOLN
0.5000 mg | Freq: Four times a day (QID) | RESPIRATORY_TRACT | Status: DC | PRN
  Administered 2024-04-03: 0.5 mg via RESPIRATORY_TRACT
  Filled 2024-04-03: qty 2.5

## 2024-04-03 MED ORDER — INSULIN REGULAR HUMAN (CONC) 500 UNIT/ML ~~LOC~~ SOPN
100.0000 [IU] | PEN_INJECTOR | Freq: Every day | SUBCUTANEOUS | Status: DC
  Administered 2024-04-04: 100 [IU] via SUBCUTANEOUS
  Filled 2024-04-03: qty 3

## 2024-04-03 MED ORDER — TAMSULOSIN HCL 0.4 MG PO CAPS
0.8000 mg | ORAL_CAPSULE | Freq: Every day | ORAL | Status: DC
  Administered 2024-04-03 – 2024-04-08 (×4): 0.8 mg via ORAL
  Filled 2024-04-03 (×5): qty 2

## 2024-04-03 MED ORDER — ATORVASTATIN CALCIUM 40 MG PO TABS: 80.0000 mg | ORAL_TABLET | Freq: Every day | ORAL | Status: DC

## 2024-04-03 MED ORDER — TRAZODONE HCL 50 MG PO TABS: 25.0000 mg | ORAL_TABLET | Freq: Every evening | ORAL | Status: DC | PRN

## 2024-04-03 MED ORDER — SODIUM CHLORIDE 0.9% FLUSH
3.0000 mL | Freq: Two times a day (BID) | INTRAVENOUS | Status: DC
  Administered 2024-04-03 – 2024-04-09 (×13): 3 mL via INTRAVENOUS

## 2024-04-03 MED ORDER — INSULIN REGULAR HUMAN (CONC) 500 UNIT/ML ~~LOC~~ SOPN: 100.0000 [IU] | PEN_INJECTOR | Freq: Every day | SUBCUTANEOUS | Status: DC

## 2024-04-03 MED ORDER — ATORVASTATIN CALCIUM 40 MG PO TABS
80.0000 mg | ORAL_TABLET | Freq: Every day | ORAL | Status: DC
  Administered 2024-04-06 – 2024-04-08 (×3): 80 mg via ORAL
  Filled 2024-04-03 (×4): qty 2

## 2024-04-03 MED ORDER — HEPARIN SODIUM (PORCINE) 5000 UNIT/ML IJ SOLN: 5000.0000 [IU] | Freq: Three times a day (TID) | INTRAMUSCULAR | Status: DC

## 2024-04-03 MED ORDER — FLUTICASONE FUROATE-VILANTEROL 200-25 MCG/ACT IN AEPB: 1.0000 | INHALATION_SPRAY | Freq: Every day | RESPIRATORY_TRACT | Status: DC

## 2024-04-03 MED ORDER — INSULIN REGULAR HUMAN (CONC) 500 UNIT/ML ~~LOC~~ SOPN: 50.0000 [IU] | PEN_INJECTOR | Freq: Every day | SUBCUTANEOUS | Status: DC

## 2024-04-03 MED ORDER — FERROUS SULFATE 325 (65 FE) MG PO TABS: 325.0000 mg | ORAL_TABLET | Freq: Every evening | ORAL | Status: DC

## 2024-04-03 MED ORDER — LACTATED RINGERS IV SOLN: INTRAVENOUS | Status: DC

## 2024-04-03 MED ORDER — LACTATED RINGERS IV BOLUS (SEPSIS)
1000.0000 mL | Freq: Once | INTRAVENOUS | Status: AC
  Administered 2024-04-03: 1000 mL via INTRAVENOUS

## 2024-04-03 MED ORDER — BISACODYL 5 MG PO TBEC: 5.0000 mg | DELAYED_RELEASE_TABLET | Freq: Every day | ORAL | Status: DC | PRN

## 2024-04-03 MED ORDER — LEVALBUTEROL HCL 0.63 MG/3ML IN NEBU
0.6300 mg | INHALATION_SOLUTION | Freq: Four times a day (QID) | RESPIRATORY_TRACT | Status: DC | PRN
  Administered 2024-04-03: 0.63 mg via RESPIRATORY_TRACT
  Filled 2024-04-03: qty 3

## 2024-04-03 MED ORDER — ACETAMINOPHEN 650 MG RE SUPP
650.0000 mg | Freq: Four times a day (QID) | RECTAL | Status: DC | PRN
Start: 2024-04-03 — End: 2024-04-09

## 2024-04-03 MED ORDER — FLEET ENEMA RE ENEM: 1.0000 | ENEMA | Freq: Once | RECTAL | Status: DC | PRN

## 2024-04-03 MED ORDER — SODIUM CHLORIDE 0.9% FLUSH: 3.0000 mL | Freq: Two times a day (BID) | INTRAVENOUS | Status: DC

## 2024-04-03 MED ORDER — APIXABAN 5 MG PO TABS
5.0000 mg | ORAL_TABLET | Freq: Two times a day (BID) | ORAL | Status: DC
  Administered 2024-04-03 – 2024-04-09 (×9): 5 mg via ORAL
  Filled 2024-04-03 (×12): qty 1

## 2024-04-03 MED ORDER — FERROUS SULFATE 325 (65 FE) MG PO TABS
325.0000 mg | ORAL_TABLET | Freq: Every evening | ORAL | Status: DC
  Administered 2024-04-04 – 2024-04-08 (×4): 325 mg via ORAL
  Filled 2024-04-03 (×4): qty 1

## 2024-04-03 MED ORDER — ACETAMINOPHEN 325 MG PO TABS: 650.0000 mg | ORAL_TABLET | Freq: Four times a day (QID) | ORAL | Status: DC | PRN

## 2024-04-03 NOTE — Assessment & Plan Note (Signed)
 H/o Afib - per wife was started on Eliquis  at [place]  Will continue

## 2024-04-03 NOTE — ED Notes (Signed)
 Pt has expiratory wheezing. RT notified

## 2024-04-03 NOTE — Assessment & Plan Note (Signed)
 Chronic systolic congestive heart failure with preserved ejection fraction -Last echo: 06/21/2023: 50 to 55%, LV function and size The right ventricular size is moderately enlarged. Right ventricular systolic  function is moderately reduced.

## 2024-04-03 NOTE — ED Notes (Signed)
 Pt stated, "I am hurting all over. Can I have something?"

## 2024-04-03 NOTE — H&P (Signed)
 History and Physical   Patient: Ricky SABRA Sr.                            PCP: Center, [facility]                    DOB: [birth year]            DOA: 04/03/2024 WGN:[id number]             DOS: 04/03/2024, 4:09 PM  Center, [facility]  Patient coming from:   HOME  I have personally reviewed patient's medical records, in electronic medical records, including:  [place] link, and care everywhere.    Chief Complaint:   Chief Complaint  Patient presents with   Weakness    History of present illness:    Ricky Poster Sr. Is a 73 year old male with extensive history of HTN, HLD, chronic systolic CHF, CAD, COPD, with chronically dependent 2L O2, OSA, on CPAP, DM2, GERD, anemia of chronic disease, morbid obesity, cardiac, bladder cancer under treatment now. Presenting with progressive generalized weakness for the past 3 days.  Uses walker at baseline, now wife uses Nurse, adult.  And confusion. Wife confirms that he is usually on 2 L of oxygen via nasal cannula, for CHF and COPD, now having more shortness of breath, lower extremity edema.  Poor p.o. intake in the past 3 days.   ED Evaluation/course: Blood pressure (!) 104/48, Pulse (!) 58, Temp. 98.4 F (36.9 C), RR (!) 29, height 5\' 8"  (1.727 m), weight 113.4 kg, SpO2 94%.  Labs: Sodium 134, potassium 5.2, chloride 94, glucose 213, BUN 52, creatinine 2.87, calcium  8.5, BNP 153, troponin 26, 23, WBC 6.4, UA: Cloudy, large hemoglobin, leukocyte Estrace, rare bacteria, WBC >50 Chest x-ray-cardiomegaly, vascular congestion, possible pulmonary edema  Patient met sepsis criteria due to UTI, requested to be admitted for IV antibiotics, IV fluids    Patient Denies having: Cough, Chest Pain, Abd pain, N/V/D, headache, dizziness, lightheadedness,  Dysuria, Joint pain, rash, open wounds    Review of Systems: As per HPI, otherwise 10 point review of systems were negative.    ----------------------------------------------------------------------------------------------------------------------  Allergies  Allergen Reactions   Poison Ivy Extract [Poison Ivy Extract] Rash   Poison Oak Extract Rash    Home MEDs:  Prior to Admission medications   Medication Sig Start Date End Date Taking? Authorizing Provider  albuterol  (PROVENTIL  HFA;VENTOLIN  HFA) 108 (90 Base) MCG/ACT inhaler Inhale 2 puffs into the lungs every 6 (six) hours as needed for wheezing or shortness of breath.    [provider]  amoxicillin-clavulanate (AUGMENTIN) 875-125 MG tablet Take 2 tablets by mouth daily at 12 noon. Patient not taking: Reported on 10/16/2023    [provider]  apixaban  (ELIQUIS ) 5 MG TABS tablet Take 1 tablet (5 mg total) by mouth 2 (two) times daily. 06/25/23 09/23/23  Pokhrel, Amador Bad, MD  aspirin  81 MG chewable tablet Chew 1 tablet (81 mg total) by mouth 2 (two) times daily after a meal. 02/14/17   Arnie Lao, MD  atorvastatin  (LIPITOR ) 80 MG tablet Take 80 mg by mouth at bedtime.    [provider]  cholecalciferol  (VITAMIN D ) 1000 UNITS tablet Take 1,000 Units by mouth every morning.    [provider]  ferrous sulfate  325 (65 FE) MG tablet Take 325 mg by mouth every evening.    [provider]  fluocinonide (LIDEX) 0.05 %  external solution Apply 1 Application topically daily as needed (itchy rash on scalp).    [provider]  fluticasone  (FLONASE ) 50 MCG/ACT nasal spray Place 1-2 sprays into both nostrils in the morning. 07/25/18   [provider]  Fluticasone -Salmeterol (ADVAIR) 250-50 MCG/DOSE AEPB Inhale 1 puff into the lungs daily. 05/18/23   Singh, Prashant K, MD  furosemide  (LASIX ) 20 MG tablet Take 20 mg by mouth daily. 07/12/19   [provider]  gabapentin  (NEURONTIN ) 300 MG capsule Take 600 mg by mouth 2 (two) times daily.    [provider]  hydrocortisone  2.5 % cream  Apply 1 Application topically 2 (two) times daily as needed (rash on face).    [provider]  insulin  aspart (NOVOLOG ) 100 UNIT/ML FlexPen Before each meal 3 times a day, 140-199 - 2 units, 200-250 - 6 units, 251-299 - 8 units,  300-349 - 10 units,  350 or above 14 units. Patient not taking: Reported on 10/16/2023 05/18/23   Singh, Prashant K, MD  insulin  glargine (LANTUS ) 100 UNIT/ML injection Inject 0.4 mLs (40 Units total) into the skin 2 (two) times daily. Patient not taking: Reported on 10/16/2023 05/18/23   Singh, Prashant K, MD  insulin  regular human CONCENTRATED (HUMULIN R ) 500 UNIT/ML KwikPen Inject 50-100 Units into the skin See admin instructions. Inject 100 units subcutaneously with breakfast, 100 units subcutaneously with lunch and 50 units subcutaneously with dinner. 06/14/22   [provider]  ketoconazole (NIZORAL) 2 % shampoo Apply 1 Application topically every Monday, Wednesday, and Friday.    [provider]  nystatin  powder Apply 1 Application topically 2 (two) times daily. Apply to groin area topically two times a day for reddened area. Please apply to groin and legs and any place there is redness. Patient not taking: Reported on 10/16/2023    [provider]  OXYGEN Inhale 2-3 L/min into the lungs See admin instructions. 2 L/min continuous. May increase to 3 L/min as needed for shortness of breath.    [provider]  Semaglutide,0.25 or 0.5MG /DOS, (OZEMPIC, 0.25 OR 0.5 MG/DOSE,) 2 MG/1.5ML SOPN Inject 0.25 mg into the skin every Sunday. Patient not taking: Reported on 10/16/2023    [provider]  spironolactone  (ALDACTONE ) 25 MG tablet Take 0.5 tablets (12.5 mg total) by mouth daily. Patient not taking: Reported on 10/16/2023 06/26/23 06/25/24  Rosena Conradi, MD  tamsulosin  (FLOMAX ) 0.4 MG CAPS capsule Take 0.4 mg by mouth at bedtime.    [provider]  Tiotropium Bromide  Monohydrate (SPIRIVA  RESPIMAT) 2.5 MCG/ACT  AERS Inhale 2 each into the lungs daily as needed (wheezing, shortness of breath.).    [provider]    PRN MEDs: acetaminophen  **OR** acetaminophen , bisacodyl, hydrALAZINE , HYDROmorphone  (DILAUDID ) injection, ipratropium, levalbuterol, ondansetron  **OR** ondansetron  (ZOFRAN ) IV, oxyCODONE , senna-docusate, sodium phosphate, traZODone  Past Medical History:  Diagnosis Date   Arthritis    Cancer (HCC)    bladder cancer currently 2016   CHF (congestive heart failure) (HCC)    Complication of anesthesia    hard for him to wake up from Anesthesia from left nephrectomy   COPD (chronic obstructive pulmonary disease) (HCC)    Coronary artery disease    Diabetes mellitus without complication (HCC)    DVT (deep venous thrombosis) (HCC) 01/08/2014, 2010   upper extremity   GERD (gastroesophageal reflux disease)    Hallux limitus 05/18/2015   from notes from [facility]    Headache    migraines   History of kidney stones  Hypercholesterolemia    Hypertension    Impaired hearing    Intervertebral disc syndrome    Kidney stones    Pulmonary fibrosis (HCC)    Renal calculi 01/08/2014   frrom noted from [facility] .in chart   Renal insufficiency 06/19/2023   Restless legs 01/08/2014   Sciatic leg pain    paralysis of sciatic nerve   Sleep apnea    CPAP/BIPAP   Tinnitus     Past Surgical History:  Procedure Laterality Date   ABDOMINAL SURGERY     BACK SURGERY     BLADDER REPAIR     EYE SURGERY     HERNIA REPAIR     LEFT HEART CATHETERIZATION WITH CORONARY ANGIOGRAM N/A 01/09/2015   Procedure: LEFT HEART CATHETERIZATION WITH CORONARY ANGIOGRAM;  Surgeon: Sharene Dauer, MD;  Location: [facility] CATH LAB;  Service: Cardiovascular;  Laterality: N/A;   NEPHROURETERECTOMY  03/18/2016   with bladder cuff excision- from noted in chart from [facility]   TOTAL HIP ARTHROPLASTY Left 02/10/2017   Procedure: LEFT TOTAL HIP ARTHROPLASTY ANTERIOR APPROACH;  Surgeon: Arnie Lao, MD;   Location: [facility] ORS;  Service: Orthopedics;  Laterality: Left;     reports that he quit smoking about 24 years ago. His smoking use included cigarettes. He started smoking about 57 years ago. He has a 33 pack-year smoking history. He has never used smokeless tobacco. He reports that he does not currently use alcohol. He reports that he does not use drugs.   No family history on file.  Physical Exam:   Vitals:   04/03/24 1230 04/03/24 1420 04/03/24 1430 04/03/24 1544  BP: (!) 107/58 96/63 (!) 104/48   Pulse: (!) 57 85 (!) 58   Resp: (!) 29     Temp:    98.7 F (37.1 C)  TempSrc:    Oral  SpO2: 94% 95% 94%   Weight:      Height:       Constitutional: lethargic NAD, calm, comfortable Eyes: PERRL, lids and conjunctivae normal ENMT: Mucous membranes are moist. Posterior pharynx clear of any exudate or lesions.Normal dentition.  Neck: normal, supple, no masses, no thyromegaly Respiratory: clear to auscultation bilaterally, no wheezing, no crackles. Normal respiratory effort. No accessory muscle use.  Cardiovascular: Regular rate and rhythm, no murmurs / rubs / gallops. No extremity edema. 2+ pedal pulses. No carotid bruits.  Abdomen: no tenderness, no masses palpated. No hepatosplenomegaly. Bowel sounds positive.  Musculoskeletal: Severe generalized weaknesses,  no clubbing / cyanosis. No joint deformity upper and lower extremities. Good ROM, no contractures. Normal muscle tone.  Neurologic: CN II-XII grossly intact. Sensation intact, DTR normal. Strength 5/5   Psychiatric: Alert and oriented x 2.  Mood stable, Skin: no rashes, lesions, ulcers. No induration Decubitus/ulcers:  Wounds: per nursing documentation  Pressure Injury 06/24/23 Coccyx Mid Stage 2 -  Partial thickness loss of dermis presenting as a shallow open injury with a red, pink wound bed without slough. Dark red area, tiny open area in center (Active)  06/24/23 1842  Location: Coccyx  Location Orientation: Mid  Staging:  Stage 2 -  Partial thickness loss of dermis presenting as a shallow open injury with a red, pink wound bed without slough.  Wound Description (Comments): Dark red area, tiny open area in center  Present on Admission:          Labs on admission:    I have personally reviewed following labs and imaging studies  CBC: Recent Labs  Lab 04/03/24 1200  WBC 6.4  NEUTROABS 5.5  HGB 12.0*  HCT 41.3  MCV 95.8  PLT 117*   Basic Metabolic Panel: Recent Labs  Lab 04/03/24 1200  NA 134*  K 5.2*  CL 94*  CO2 29  GLUCOSE 213*  BUN 52*  CREATININE 2.87*  CALCIUM  8.5*   GFR: Estimated Creatinine Clearance: 28.4 mL/min (A) (by C-G formula based on SCr of 2.87 mg/dL (H)). Liver Function Tests: Recent Labs  Lab 04/03/24 1200  AST 21  ALT 18  ALKPHOS 75  BILITOT 1.3*  PROT 6.8  ALBUMIN 2.6*    Urine analysis:    Component Value Date/Time   COLORURINE AMBER (A) 04/03/2024 1400   APPEARANCEUR CLOUDY (A) 04/03/2024 1400   LABSPEC 1.014 04/03/2024 1400   PHURINE 5.0 04/03/2024 1400   GLUCOSEU 50 (A) 04/03/2024 1400   HGBUR LARGE (A) 04/03/2024 1400   BILIRUBINUR NEGATIVE 04/03/2024 1400   KETONESUR NEGATIVE 04/03/2024 1400   PROTEINUR 100 (A) 04/03/2024 1400   NITRITE NEGATIVE 04/03/2024 1400   LEUKOCYTESUR MODERATE (A) 04/03/2024 1400    Last A1C:  Lab Results  Component Value Date   HGBA1C 8.6 (H) 05/11/2023     Radiologic Exams on Admission:   DG Chest Port 1 View Result Date: 04/03/2024 CLINICAL DATA:  Weakness. EXAM: PORTABLE CHEST 1 VIEW COMPARISON:  06/19/2023 FINDINGS: Lung volumes are low. Stable cardiomegaly. Unchanged mediastinal contours. Vascular congestion. Interstitial accentuation may represent pulmonary edema. No significant pleural effusion. No focal airspace disease. No pneumothorax. IMPRESSION: Cardiomegaly with vascular congestion and possible pulmonary edema. Electronically Signed   By: Chadwick Colonel M.D.   On: 04/03/2024 12:01    EKG:    Independently reviewed.  Orders placed or performed during the hospital encounter of 04/03/24   EKG 12-Lead   EKG 12-Lead   EKG 12-Lead   ---------------------------------------------------------------------------------------------------------------------------------------    Assessment / Plan:   Principal Problem:   Sepsis (HCC) Active Problems:   Urothelial carcinoma (HCC)   COPD (chronic obstructive pulmonary disease) (HCC)   Chronic systolic heart failure (HCC)   HTN (hypertension)   CKD stage 3a, GFR 45-59 ml/min (HCC)   Morbid obesity with BMI of 40.0-44.9, adult (HCC)   Hyperlipidemia associated with type 2 diabetes mellitus (HCC)   Esophageal reflux   OSA (obstructive sleep apnea)   Impaired functional mobility, balance, gait, and endurance   Bladder cancer (HCC)   Renal insufficiency   A-fib (HCC)   Assessment and Plan: * Sepsis (HCC) POA: Met sepsis criteria hypotension, tachycardia, tachypnea Source of infection UTI -Initiated on gentle IV fluid hydration, IV antibiotics of Rocephin  -Will follow-up with urine culture, blood cultures -Will continue IV antibiotics Rocephin  2 g daily -WBC 6.4, lactic acid 0.9 - Will be judicious with IV fluid hydration due to history of CHF, and congestion.  Urothelial carcinoma (HCC) - Bladder/urethral cath -Patient is to continue outpatient follow-up and treatment  COPD (chronic obstructive pulmonary disease) (HCC) - Chronic respiratory failure, on 2 L of oxygen at baseline -Continue O2 sat 92-88%, as needed DuoNeb,   Chronic systolic heart failure (HCC) Chronic systolic congestive heart failure with preserved ejection fraction -Last echo: 06/21/2023: 50 to 55%, LV function and size The right ventricular size is moderately enlarged. Right ventricular systolic  function is moderately reduced.   HTN (hypertension) POA: Hypotensive due to sepsis, with holding BP meds for now including Aldactone  and Lasix    CKD stage  3a, GFR 45-59 ml/min (HCC) - Avoid nephrotoxins, hypotension -Baseline creatinine  around 1.2-2.0  POA.  BUN 52, creatinine 2.87 (1.51 on 06/25/2023), GFR 23 - UA positive for glucose, protein 100, -Will monitor closely   Morbid obesity with BMI of 40.0-44.9, adult (HCC) Body mass index is 38.01 kg/m. -Discussing regarding fluid management, weight loss, Close follow-up with PCP  A-fib (HCC) H/o Afib - per wife was started on Eliquis  at [facility]  Will continue   Renal insufficiency BUN/creatinine elevated from baseline Lab Results  Component Value Date   CREATININE 2.87 (H) 04/03/2024   CREATININE 1.51 (H) 06/25/2023   CREATININE 1.43 (H) 06/22/2023   -Voiding nephrotoxin, avoiding hypotension -Adjusting medication to renal dose -Continue gentle fluid hydration  Bladder cancer (HCC) - Follows outpatient with oncologist and treatment  Impaired functional mobility, balance, gait, and endurance - Patient usually ambulates with walker, now requiring assist with all ADLs, including Hoyer lift -Consulted PT OT for evaluation, anticipating rehab, SNF on discharge  OSA (obstructive sleep apnea) - Continue nightly CPAP, supplemental oxygen  Esophageal reflux Continue PPI  Hyperlipidemia associated with type 2 diabetes mellitus (HCC) - Continue statins     POA coccyx pressure wound stage II, -Continue wound care per nursing, rotate in bed every 2 hours         Consults called:  None -------------------------------------------------------------------------------------------------------------------------------------------- DVT prophylaxis:  TED hose Start: 04/03/24 1440 SCDs Start: 04/03/24 1440 apixaban  (ELIQUIS ) tablet 5 mg   Code Status:   Code Status: Full Code   Admission status: Patient will be admitted as Inpatient, with a greater than 2 midnight length of stay. Level of care: Stepdown   Family Communication:  none at bedside  (The above findings and plan  of care has been discussed with patient in detail, the patient expressed understanding and agreement of above plan)  --------------------------------------------------------------------------------------------------------------------------------------------------  Disposition Plan: >3 days Status is: Inpatient Remains inpatient appropriate because: Needing IV antibiotics, IV fluids, sepsis treatment.  -------------------------------------------------------------------------------------------------------------------------------------  Time spent:  20  Min.  Was spent seeing and evaluating the patient, reviewing all medical records, drawn plan of care.  SIGNED: Bobbetta Burnet, MD, FHM. FAAFP. [place] [facility], Pager  (Please use [email or web address] to page/ or secure chat through epic) If 7PM-7AM, please contact night-coverage [email or web address],  04/03/2024, 4:09 PM

## 2024-04-03 NOTE — ED Provider Notes (Signed)
  EMERGENCY DEPARTMENT AT [facility] Provider Note   CSN: [id number] Arrival date & time: 04/03/24  1059     History  Chief Complaint  Patient presents with   Weakness    Hammond Burningham. is a 73 y.o. male.  Patient is a 73 year old male who presents emergency department the chief complaint of worsening generalized weakness over the past 3 days.  He is currently undergoing treatment for bladder cancer.  Spouse does note that he has seemed more confused as well.  He is chronically on 2 L nasal cannula for CHF and COPD.  He does admit to some mild increased shortness of breath as well as lower extremity edema.  He denies any associated syncopal events.  He denies any active chest pain.  He denies any nausea, vomiting, diarrhea.  He does admit to poor p.o. intake.   Weakness      Home Medications Prior to Admission medications   Medication Sig Start Date End Date Taking? Authorizing Provider  albuterol  (PROVENTIL  HFA;VENTOLIN  HFA) 108 (90 Base) MCG/ACT inhaler Inhale 2 puffs into the lungs every 6 (six) hours as needed for wheezing or shortness of breath.    [provider]  amoxicillin-clavulanate (AUGMENTIN) 875-125 MG tablet Take 2 tablets by mouth daily at 12 noon. Patient not taking: Reported on 10/16/2023    [provider]  apixaban  (ELIQUIS ) 5 MG TABS tablet Take 1 tablet (5 mg total) by mouth 2 (two) times daily. 06/25/23 09/23/23  Pokhrel, Amador Bad, MD  aspirin  81 MG chewable tablet Chew 1 tablet (81 mg total) by mouth 2 (two) times daily after a meal. 02/14/17   Arnie Lao, MD  atorvastatin  (LIPITOR ) 80 MG tablet Take 80 mg by mouth at bedtime.    [provider]  cholecalciferol  (VITAMIN D ) 1000 UNITS tablet Take 1,000 Units by mouth every morning.    [provider]  ferrous sulfate  325 (65 FE) MG tablet Take 325 mg by mouth every evening.    [provider]  fluocinonide (LIDEX) 0.05 % external  solution Apply 1 Application topically daily as needed (itchy rash on scalp).    [provider]  fluticasone  (FLONASE ) 50 MCG/ACT nasal spray Place 1-2 sprays into both nostrils in the morning. 07/25/18   [provider]  Fluticasone -Salmeterol (ADVAIR) 250-50 MCG/DOSE AEPB Inhale 1 puff into the lungs daily. 05/18/23   Singh, Prashant K, MD  furosemide  (LASIX ) 20 MG tablet Take 20 mg by mouth daily. 07/12/19   [provider]  gabapentin  (NEURONTIN ) 300 MG capsule Take 600 mg by mouth 2 (two) times daily.    [provider]  hydrocortisone  2.5 % cream Apply 1 Application topically 2 (two) times daily as needed (rash on face).    [provider]  insulin  aspart (NOVOLOG ) 100 UNIT/ML FlexPen Before each meal 3 times a day, 140-199 - 2 units, 200-250 - 6 units, 251-299 - 8 units,  300-349 - 10 units,  350 or above 14 units. Patient not taking: Reported on 10/16/2023 05/18/23   Singh, Prashant K, MD  insulin  glargine (LANTUS ) 100 UNIT/ML injection Inject 0.4 mLs (40 Units total) into the skin 2 (two) times daily. Patient not taking: Reported on 10/16/2023 05/18/23   Singh, Prashant K, MD  insulin  regular human CONCENTRATED (HUMULIN R ) 500 UNIT/ML KwikPen Inject 50-100 Units into the skin See admin instructions. Inject 100 units subcutaneously with breakfast, 100 units subcutaneously with lunch and 50 units subcutaneously with dinner. 06/14/22  [provider]  ketoconazole  (NIZORAL ) 2 % shampoo Apply 1 Application topically every Monday, Wednesday, and Friday.    [provider]  nystatin  powder Apply 1 Application topically 2 (two) times daily. Apply to groin area topically two times a day for reddened area. Please apply to groin and legs and any place there is redness. Patient not taking: Reported on 10/16/2023    [provider]  OXYGEN Inhale 2-3 L/min into the lungs See admin instructions. 2 L/min continuous. May increase to 3 L/min  as needed for shortness of breath.    [provider]  Semaglutide,0.25 or 0.5MG /DOS, (OZEMPIC, 0.25 OR 0.5 MG/DOSE,) 2 MG/1.5ML SOPN Inject 0.25 mg into the skin every Sunday. Patient not taking: Reported on 10/16/2023    [provider]  spironolactone  (ALDACTONE ) 25 MG tablet Take 0.5 tablets (12.5 mg total) by mouth daily. Patient not taking: Reported on 10/16/2023 06/26/23 06/25/24  Pokhrel, Laxman, MD  tamsulosin  (FLOMAX ) 0.4 MG CAPS capsule Take 0.4 mg by mouth at bedtime.    [provider]  Tiotropium Bromide  Monohydrate (SPIRIVA  RESPIMAT) 2.5 MCG/ACT AERS Inhale 2 each into the lungs daily as needed (wheezing, shortness of breath.).    [provider]      Allergies    Poison ivy extract [poison ivy extract] and Poison oak extract    Review of Systems   Review of Systems  Neurological:  Positive for weakness.  All other systems reviewed and are negative.   Physical Exam Updated Vital Signs BP (!) 96/48   Pulse 79   Temp 98.4 F (36.9 C) (Oral)   Resp 18   Ht 5\' 8"  (1.727 m)   Wt 113.4 kg   SpO2 92%   BMI 38.01 kg/m  Physical Exam Vitals and nursing note reviewed.  Constitutional:      Appearance: Normal appearance.  HENT:     Head: Normocephalic and atraumatic.     Nose: Nose normal.     Mouth/Throat:     Mouth: Mucous membranes are moist.  Eyes:     Extraocular Movements: Extraocular movements intact.     Conjunctiva/sclera: Conjunctivae normal.     Pupils: Pupils are equal, round, and reactive to light.  Cardiovascular:     Rate and Rhythm: Normal rate and regular rhythm.     Pulses: Normal pulses.     Heart sounds: Normal heart sounds. No murmur heard.    No gallop.  Pulmonary:     Effort: Pulmonary effort is normal. No respiratory distress.     Breath sounds: No stridor. Rales present. No wheezing or rhonchi.  Chest:     Chest wall: No tenderness.  Abdominal:     General: Abdomen is flat. Bowel sounds are normal.  There is no distension.     Palpations: Abdomen is soft.     Tenderness: There is no abdominal tenderness. There is no guarding.  Musculoskeletal:        General: No deformity or signs of injury. Normal range of motion.     Cervical back: Normal range of motion and neck supple. No rigidity or tenderness.     Right lower leg: Edema present.     Left lower leg: Edema present.  Skin:    General: Skin is warm and dry.  Neurological:     General: No focal deficit present.     Mental Status: He is alert and oriented to person, place, and time. Mental status is at baseline.  Cranial Nerves: No cranial nerve deficit.     Sensory: No sensory deficit.     Coordination: Coordination normal.  Psychiatric:        Mood and Affect: Mood normal.        Behavior: Behavior normal.        Thought Content: Thought content normal.        Judgment: Judgment normal.     ED Results / Procedures / Treatments   Labs (all labs ordered are listed, but only abnormal results are displayed) Labs Reviewed  URINE CULTURE  CULTURE, BLOOD (ROUTINE X 2)  CULTURE, BLOOD (ROUTINE X 2)  RESP PANEL BY RT-PCR (RSV, FLU A&B, COVID)  [id number]  URINALYSIS, ROUTINE W REFLEX MICROSCOPIC  COMPREHENSIVE METABOLIC PANEL WITH GFR  LACTIC ACID, PLASMA  LACTIC ACID, PLASMA  CBC WITH DIFFERENTIAL/PLATELET  BRAIN NATRIURETIC PEPTIDE  BLOOD GAS, VENOUS  TROPONIN I (HIGH SENSITIVITY)    EKG None  Radiology DG Chest Port 1 View Result Date: 04/03/2024 CLINICAL DATA:  Weakness. EXAM: PORTABLE CHEST 1 VIEW COMPARISON:  06/19/2023 FINDINGS: Lung volumes are low. Stable cardiomegaly. Unchanged mediastinal contours. Vascular congestion. Interstitial accentuation may represent pulmonary edema. No significant pleural effusion. No focal airspace disease. No pneumothorax. IMPRESSION: Cardiomegaly with vascular congestion and possible pulmonary edema. Electronically Signed   By: Chadwick Colonel M.D.   On: 04/03/2024 12:01     Procedures .Critical Care  Performed by: Roselynn Connors, PA-C Authorized by: Roselynn Connors, PA-C   Critical care provider statement:    Critical care time (minutes):  45   Critical care was necessary to treat or prevent imminent or life-threatening deterioration of the following conditions:  Sepsis and dehydration   Critical care was time spent personally by me on the following activities:  Development of treatment plan with patient or surrogate, discussions with consultants, evaluation of patient's response to treatment, examination of patient, ordering and review of laboratory studies, ordering and review of radiographic studies, ordering and performing treatments and interventions, pulse oximetry, re-evaluation of patient's condition and review of old charts   I assumed direction of critical care for this patient from another provider in my specialty: no     Care discussed with: admitting provider       Medications Ordered in ED Medications  sodium chloride  0.9 % bolus 500 mL (has no administration in time range)    ED Course/ Medical Decision Making/ A&P Clinical Course as of 04/03/24 1440  Wed Apr 03, 2024  1245 Sinus tachycardia, rate of 119, normal PR/QRS interval, normal QTc, bigeminy, right axis deviation, no ST/T wave changes, no ischemic changes, no STEMI [CR]    Clinical Course User Index [CR] Roselynn Connors, PA-C                                 Medical Decision Making Amount and/or Complexity of Data Reviewed Labs: ordered. Radiology: ordered.  Risk Prescription drug management. Decision regarding hospitalization.   This patient presents to the ED for concern of generalized weakness, fever, this involves an extensive number of treatment options, and is a complaint that carries with it a high risk of complications and morbidity.  The differential diagnosis includes sepsis, pneumonia, urinary tract infection, pyelonephritis, electrolyte  derangement, acute kidney injury, deconditioning   Co morbidities that complicate the patient evaluation  CHF, COPD, bladder cancer   Additional history obtained:  Additional history obtained from wife External records  from outside source obtained and reviewed including medical records   Lab Tests:  I Ordered, and personally interpreted labs.  The pertinent results include: No leukocytosis, mild anemia, elevated BNP, elevated troponin, elevated creatinine, urinalysis with moderate leukocytes, negative viral swab   Imaging Studies ordered:  I ordered imaging studies including chest x-ray I independently visualized and interpreted imaging which showed pulmonary edema I agree with the radiologist interpretation   Cardiac Monitoring: / EKG:  The patient was maintained on a cardiac monitor.  I personally viewed and interpreted the cardiac monitored which showed an underlying rhythm of: Sinus tachycardia with bigeminy, no ST/T wave changes, no STEMI   Consultations Obtained:  I requested consultation with the hospitalist,  and discussed lab and imaging findings as well as pertinent plan - they recommend: Admission   Problem List / ED Course / Critical interventions / Medication management  Patient does remain stable at this time.  Patient blood pressure has improved since arrival with most recent while I was last evaluate the patient at 110/60.  Patient has no signs of acute respiratory distress at this time.  Urinalysis is consistent with urinary tract infection.  He has been treated with IV antibiotics.  He did meet sepsis criteria on presentation.  I have been gently hydrating this patient to avoid fluid overload as he already has pulmonary edema on his chest x-ray so have avoided the full sepsis bolus.  Patient also has associated acute kidney injury.  Suspect his elevated troponin is secondary to demand from his infection and pulmonary edema.  He is on his 2 L nasal cannula at  this time and not requiring any further oxygen support.  Have discussed patient case with Dr. Eilene Grater with the hospitalist service who has excepted for admission. I ordered medication including Rocephin , IV fluids for sepsis, urinary tract infection Reevaluation of the patient after these medicines showed that the patient improved I have reviewed the patients home medicines and have made adjustments as needed   Social Determinants of Health:  None   Test / Admission - Considered:  Admission        Final Clinical Impression(s) / ED Diagnoses Final diagnoses:  None    Rx / DC Orders ED Discharge Orders     None         Emmalene Hare 04/03/24 1444    Ninetta Basket, MD 04/09/24 1132

## 2024-04-03 NOTE — Assessment & Plan Note (Addendum)
 POA: Met sepsis criteria hypotension, tachycardia, tachypnea Source of infection UTI -Initiated on gentle IV fluid hydration, IV antibiotics of Rocephin  -Will follow-up with urine culture, blood cultures -Will continue IV antibiotics Rocephin  2 g daily -WBC 6.4, lactic acid 0.9 - Will be judicious with IV fluid hydration due to history of CHF, and congestion.

## 2024-04-03 NOTE — Assessment & Plan Note (Signed)
 Body mass index is 38.01 kg/m. -Discussing regarding fluid management, weight loss, Close follow-up with PCP

## 2024-04-03 NOTE — TOC Initial Note (Signed)
 Transition of Care (TOC) - Initial/Assessment Note    Patient Details  Name: Ricky BURKEEN Sr. MRN: [id number] Date of Birth: September 22, 1951  Transition of Care [facility]) CM/SW Contact:    Geraldina Klinefelter, RN Phone Number: 04/03/2024, 8:42 PM  Clinical Narrative:                 High readmission risk admitted c/sepsis. Currently active c/[facility]. Home oxygen provider is [place], [place], [place]. Pt has BiPAP, hospital bed, Alen Amy, rollator, shower bench, shower chair, Most DME provided by the [place]. SNF likely at dc. [facility] notification:[id number]. PASRR from 05/10/23 [id number] A. FL2 started.   Expected Discharge Plan: Skilled Nursing Facility Barriers to Discharge: Continued Medical Work up  Patient Goals and CMS Choice Patient states their goals for this hospitalization and ongoing recovery are:: Strengthening CMS Medicare.gov Compare Post Acute Care list provided to:: Patient Represenative (must comment) Yoltzin Lahner (wife)) Choice offered to / list presented to : Spouse [place] ownership interest in [facility].provided to:: Spouse   Expected Discharge Plan and Services In-house Referral: Clinical Social Work Discharge Planning Services: CM Consult Post Acute Care Choice: Skilled Nursing Facility Living arrangements for the past 2 months: Single Family Home    Prior Living Arrangements/Services Living arrangements for the past 2 months: Single Family Home Lives with:: Spouse Patient language and need for interpreter reviewed:: Yes Do you feel safe going back to the place where you live?: No   r/t weakness, difficulty breathing  Need for Family Participation in Patient Care: Yes (Comment) Care giver support system in place?: Yes (comment) Current home services: Home PT ([facility]) Criminal Activity/Legal Involvement Pertinent to Current Situation/Hospitalization: No - Comment as needed  Activities of Daily Living   ADL Screening (condition at time of  admission) Independently performs ADLs?: (P) No Does the patient have a NEW difficulty with bathing/dressing/toileting/self-feeding that is expected to last >3 days?: (P) Yes (Initiates electronic notice to provider for possible OT consult) Does the patient have a NEW difficulty with getting in/out of bed, walking, or climbing stairs that is expected to last >3 days?: (P) Yes (Initiates electronic notice to provider for possible PT consult) Does the patient have a NEW difficulty with communication that is expected to last >3 days?: (P) No Is the patient deaf or have difficulty hearing?: (P) No Does the patient have difficulty seeing, even when wearing glasses/contacts?: (P) No Does the patient have difficulty concentrating, remembering, or making decisions?: (P) No  Permission Sought/Granted Permission sought to share information with : Case Manager, Family Supports, Oceanographer granted to share information with : Yes, Verbal Permission Granted  Emotional Assessment   Attitude/Demeanor/Rapport: Engaged Affect (typically observed): Appropriate Orientation: : Oriented to Self, Oriented to Place, Oriented to Situation Alcohol / Substance Use: Not Applicable Psych Involvement: No (comment)  Admission diagnosis:  Sepsis (HCC) [A41.9] Patient Active Problem List   Diagnosis Date Noted   Sepsis (HCC) 04/03/2024   A-fib (HCC) 04/03/2024   Renal insufficiency 06/19/2023   Acute and chronic respiratory failure with hypercapnia (HCC) 06/19/2023   Bradycardia 05/13/2023   Acute on chronic respiratory failure with hypoxia and hypercapnia (HCC) 05/11/2023   Acute hypoxic respiratory failure (HCC) 05/11/2023   Class 2 obesity 05/07/2023   CKD stage 3a, GFR 45-59 ml/min (HCC) 05/07/2023   Impaired functional mobility, balance, gait, and endurance 09/27/2018   Unilateral primary osteoarthritis, left hip 02/10/2017   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   H/O  total hip arthroplasty,  left 02/10/2017   History of total hip arthroplasty 02/10/2017   Respiratory failure (HCC)    OSA (obstructive sleep apnea)    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   HTN (hypertension) 10/13/2016   Anemia 03/02/2016   BPH (benign prostatic hyperplasia) 03/02/2016   Type 2 diabetes mellitus with hyperlipidemia (HCC) 03/02/2016   OAB (overactive bladder) 03/02/2016   CAD (coronary artery disease) 02/09/2015   Morbid obesity with BMI of 40.0-44.9, adult (HCC) 01/29/2015   Bladder cancer (HCC) 01/29/2015   Chronic systolic heart failure (HCC)    Esophageal reflux    Chest pain 01/08/2015   Insulin  dependent diabetes mellitus 01/08/2015   COPD (chronic obstructive pulmonary disease) (HCC) 01/08/2015   Hyperlipidemia associated with type 2 diabetes mellitus (HCC) 01/08/2015   PCP:  [facility]:   [facility] [place], [place] - [address] [place] [address] Phone: [phone] Fax: [phone]  [facility] [address], [place] - [address] [place] [address] [place] [address] [place] [address] Phone: [phone] Fax: [phone]  Social Drivers of Health (SDOH) Social History: SDOH Screenings   Food Insecurity: Low Risk  (01/04/2024)   Received from [facility]  Housing: Low Risk  (04/03/2024)  Transportation Needs: No Transportation Needs (01/04/2024)   Received from [facility]  Utilities: Not At Risk (04/03/2024)  Depression (PHQ2-9): Low Risk  (11/08/2021)  Tobacco Use: Medium Risk (12/26/2023)   Received from [facility]   SDOH Interventions:  Readmission Risk Interventions    04/03/2024    8:38 PM  Readmission Risk Prevention Plan  Transportation Screening Complete  Medication Review (RN Care Manager) Complete  PCP or Specialist appointment within 3-5 days of discharge Complete  HRI or Home Care Consult Complete  SW Recovery Care/Counseling Consult Complete  Palliative Care Screening Not  Applicable  Skilled Nursing Facility Complete

## 2024-04-03 NOTE — ED Notes (Signed)
 Pt's wife left personal bipap machine

## 2024-04-03 NOTE — Assessment & Plan Note (Signed)
-   Continue nightly CPAP, supplemental oxygen

## 2024-04-03 NOTE — ED Triage Notes (Signed)
 Pt usually uses a walker to get around the house but pt has needed a hoyer lift for the past 3 days. Pt has had a fever but not able to give an amount. Pt is on 2lpm baseline and gets BCG's once a week for bladder cancer.

## 2024-04-03 NOTE — Assessment & Plan Note (Signed)
 Continue PPI ?

## 2024-04-03 NOTE — Assessment & Plan Note (Signed)
 POA: Hypotensive due to sepsis, with holding BP meds for now including Aldactone  and Lasix 

## 2024-04-03 NOTE — Assessment & Plan Note (Signed)
 BUN/creatinine elevated from baseline Lab Results  Component Value Date   CREATININE 2.87 (H) 04/03/2024   CREATININE 1.51 (H) 06/25/2023   CREATININE 1.43 (H) 06/22/2023   -Voiding nephrotoxin, avoiding hypotension -Adjusting medication to renal dose -Continue gentle fluid hydration

## 2024-04-03 NOTE — Assessment & Plan Note (Signed)
-   Patient usually ambulates with walker, now requiring assist with all ADLs, including Hoyer lift -Consulted PT OT for evaluation, anticipating rehab, SNF on discharge

## 2024-04-03 NOTE — Assessment & Plan Note (Addendum)
-   Bladder/urethral cath -Patient is to continue outpatient follow-up and treatment

## 2024-04-03 NOTE — Hospital Course (Addendum)
 Ricky Poster Sr. Is a 73 year old male with extensive history of HTN, HLD, chronic systolic CHF, CAD, COPD, with chronically dependent 2L O2, OSA, on CPAP, DM2, GERD, anemia of chronic disease, morbid obesity, cardiac, bladder cancer under treatment now. Presenting with progressive generalized weakness for the past 3 days.  Uses walker at baseline, now wife uses Nurse, adult.  And confusion. Wife confirms that he is usually on 2 L of oxygen via nasal cannula, for CHF and COPD, now having more shortness of breath, lower extremity edema.  Poor p.o. intake in the past 3 days.   ED Evaluation/course: Blood pressure (!) 104/48, Pulse (!) 58, Temp. 98.4 F (36.9 C), RR (!) 29, height 5\' 8"  (1.727 m), weight 113.4 kg, SpO2 94%.  Labs: Sodium 134, potassium 5.2, chloride 94, glucose 213, BUN 52, creatinine 2.87, calcium  8.5, BNP 153, troponin 26, 23, WBC 6.4, UA: Cloudy, large hemoglobin, leukocyte Estrace, rare bacteria, WBC >50 Chest x-ray-cardiomegaly, vascular congestion, possible pulmonary edema  Patient met sepsis criteria due to UTI, requested to be admitted for IV antibiotics, IV fluids

## 2024-04-03 NOTE — Progress Notes (Signed)
 Elink following for sepsis protocol.

## 2024-04-03 NOTE — Assessment & Plan Note (Addendum)
-   Avoid nephrotoxins, hypotension -Baseline creatinine around 1.2-2.0  POA.  BUN 52, creatinine 2.87 (1.51 on 06/25/2023), GFR 23 - UA positive for glucose, protein 100, -Will monitor closely

## 2024-04-03 NOTE — Assessment & Plan Note (Signed)
-   Chronic respiratory failure, on 2 L of oxygen at baseline -Continue O2 sat 92-88%, as needed [place],

## 2024-04-03 NOTE — Assessment & Plan Note (Signed)
-   Follows outpatient with oncologist and treatment

## 2024-04-03 NOTE — Assessment & Plan Note (Signed)
 Continue statins

## 2024-04-04 ENCOUNTER — Encounter (HOSPITAL_COMMUNITY): Payer: Self-pay | Admitting: Family Medicine

## 2024-04-04 DIAGNOSIS — Z515 Encounter for palliative care: Secondary | ICD-10-CM

## 2024-04-04 DIAGNOSIS — J9600 Acute respiratory failure, unspecified whether with hypoxia or hypercapnia: Secondary | ICD-10-CM | POA: Diagnosis not present

## 2024-04-04 DIAGNOSIS — Z7189 Other specified counseling: Secondary | ICD-10-CM

## 2024-04-04 DIAGNOSIS — A419 Sepsis, unspecified organism: Secondary | ICD-10-CM | POA: Diagnosis not present

## 2024-04-04 DIAGNOSIS — R6520 Severe sepsis without septic shock: Secondary | ICD-10-CM | POA: Diagnosis not present

## 2024-04-04 LAB — COMPREHENSIVE METABOLIC PANEL WITH GFR
ALT: 17 U/L (ref 0–44)
AST: 17 U/L (ref 15–41)
Albumin: 2.4 g/dL — ABNORMAL LOW (ref 3.5–5.0)
Alkaline Phosphatase: 66 U/L (ref 38–126)
Anion gap: 10 (ref 5–15)
BUN: 56 mg/dL — ABNORMAL HIGH (ref 8–23)
CO2: 27 mmol/L (ref 22–32)
Calcium: 8.1 mg/dL — ABNORMAL LOW (ref 8.9–10.3)
Chloride: 101 mmol/L (ref 98–111)
Creatinine, Ser: 2.7 mg/dL — ABNORMAL HIGH (ref 0.61–1.24)
GFR, Estimated: 24 mL/min — ABNORMAL LOW (ref >60)
Glucose, Bld: 189 mg/dL — ABNORMAL HIGH (ref 70–99)
Potassium: 5.3 mmol/L — ABNORMAL HIGH (ref 3.5–5.1)
Sodium: 138 mmol/L (ref 135–145)
Total Bilirubin: 0.5 mg/dL (ref 0.0–1.2)
Total Protein: 6.3 g/dL — ABNORMAL LOW (ref 6.5–8.1)

## 2024-04-04 LAB — CBC
HCT: 40.3 % (ref 39.0–52.0)
Hemoglobin: 11.5 g/dL — ABNORMAL LOW (ref 13.0–17.0)
MCH: 27.4 pg (ref 26.0–34.0)
MCHC: 28.5 g/dL — ABNORMAL LOW (ref 30.0–36.0)
MCV: 96 fL (ref 80.0–100.0)
Platelets: 122 10*3/uL — ABNORMAL LOW (ref 150–400)
RBC: 4.2 MIL/uL — ABNORMAL LOW (ref 4.22–5.81)
RDW: 14.2 % (ref 11.5–15.5)
WBC: 4 10*3/uL (ref 4.0–10.5)
nRBC: 0 % (ref 0.0–0.2)

## 2024-04-04 LAB — BLOOD CULTURE ID PANEL (REFLEXED) - BCID2

## 2024-04-04 LAB — GLUCOSE, CAPILLARY
Glucose-Capillary: 182 mg/dL — ABNORMAL HIGH (ref 70–99)
Glucose-Capillary: 49 mg/dL — ABNORMAL LOW (ref 70–99)

## 2024-04-04 MED ORDER — GABAPENTIN 400 MG PO CAPS
400.0000 mg | ORAL_CAPSULE | Freq: Two times a day (BID) | ORAL | Status: DC
  Administered 2024-04-06: 400 mg via ORAL
  Filled 2024-04-04 (×2): qty 1

## 2024-04-04 MED ORDER — DEXTROSE-SODIUM CHLORIDE 5-0.9 % IV SOLN: INTRAVENOUS | Status: AC

## 2024-04-04 MED ORDER — LACTATED RINGERS IV SOLN: INTRAVENOUS | Status: AC

## 2024-04-04 MED ORDER — INSULIN REGULAR HUMAN (CONC) 500 UNIT/ML ~~LOC~~ SOPN
60.0000 [IU] | PEN_INJECTOR | Freq: Every day | SUBCUTANEOUS | Status: AC
  Administered 2024-04-04: 60 [IU] via SUBCUTANEOUS

## 2024-04-04 MED ORDER — FUROSEMIDE 20 MG PO TABS
20.0000 mg | ORAL_TABLET | Freq: Every day | ORAL | Status: DC
  Administered 2024-04-06 – 2024-04-07 (×2): 20 mg via ORAL
  Filled 2024-04-04 (×3): qty 1

## 2024-04-04 MED ORDER — INSULIN REGULAR HUMAN (CONC) 500 UNIT/ML ~~LOC~~ SOPN
30.0000 [IU] | PEN_INJECTOR | Freq: Every day | SUBCUTANEOUS | Status: DC
  Administered 2024-04-04: 30 [IU] via SUBCUTANEOUS

## 2024-04-04 MED ORDER — DEXTROSE 50 % IV SOLN
INTRAVENOUS | Status: AC
  Administered 2024-04-04: 25 g via INTRAVENOUS
  Filled 2024-04-04: qty 50

## 2024-04-04 MED ORDER — INSULIN REGULAR HUMAN (CONC) 500 UNIT/ML ~~LOC~~ SOPN: 75.0000 [IU] | PEN_INJECTOR | Freq: Every day | SUBCUTANEOUS | Status: DC

## 2024-04-04 MED ORDER — LEVALBUTEROL HCL 0.63 MG/3ML IN NEBU
0.6300 mg | INHALATION_SOLUTION | Freq: Four times a day (QID) | RESPIRATORY_TRACT | Status: DC
Start: 2024-04-04 — End: 2024-04-09
  Administered 2024-04-04 – 2024-04-09 (×20): 0.63 mg via RESPIRATORY_TRACT
  Filled 2024-04-04 (×20): qty 3

## 2024-04-04 MED ORDER — INSULIN REGULAR HUMAN (CONC) 500 UNIT/ML ~~LOC~~ SOPN
60.0000 [IU] | PEN_INJECTOR | Freq: Every day | SUBCUTANEOUS | Status: DC
  Filled 2024-04-04: qty 3

## 2024-04-04 MED ORDER — IPRATROPIUM BROMIDE 0.02 % IN SOLN
0.5000 mg | Freq: Four times a day (QID) | RESPIRATORY_TRACT | Status: DC
Start: 2024-04-04 — End: 2024-04-09
  Administered 2024-04-04 – 2024-04-09 (×20): 0.5 mg via RESPIRATORY_TRACT
  Filled 2024-04-04 (×20): qty 2.5

## 2024-04-04 MED ORDER — MAGNESIUM CITRATE PO SOLN: 1.0000 | Freq: Once | ORAL | Status: DC

## 2024-04-04 MED ORDER — SENNOSIDES-DOCUSATE SODIUM 8.6-50 MG PO TABS
1.0000 | ORAL_TABLET | Freq: Two times a day (BID) | ORAL | Status: DC
Start: 2024-04-04 — End: 2024-04-09
  Administered 2024-04-06 – 2024-04-09 (×7): 1 via ORAL
  Filled 2024-04-04 (×9): qty 1

## 2024-04-04 MED ORDER — CHLORHEXIDINE GLUCONATE CLOTH 2 % EX PADS
6.0000 | MEDICATED_PAD | Freq: Every day | CUTANEOUS | Status: DC
Start: 2024-04-04 — End: 2024-04-09
  Administered 2024-04-04 – 2024-04-08 (×5): 6 via TOPICAL

## 2024-04-04 MED ORDER — SODIUM CHLORIDE 0.9 % IV SOLN
2.0000 g | Freq: Four times a day (QID) | INTRAVENOUS | Status: DC
  Administered 2024-04-04 – 2024-04-06 (×9): 2 g via INTRAVENOUS
  Filled 2024-04-04: qty 2000
  Filled 2024-04-04 (×2): qty 2
  Filled 2024-04-04: qty 2000
  Filled 2024-04-04 (×2): qty 2
  Filled 2024-04-04 (×5): qty 2000
  Filled 2024-04-04 (×2): qty 2

## 2024-04-04 MED ORDER — DEXTROSE 50 % IV SOLN: 25.0000 g | Freq: Once | INTRAVENOUS | Status: AC

## 2024-04-04 NOTE — Plan of Care (Signed)
  Problem: Acute Rehab PT Goals(only PT should resolve) Goal: Pt Will Go Supine/Side To Sit Outcome: Progressing Flowsheets (Taken 04/04/2024 1418) Pt will go Supine/Side to Sit:  with supervision  with contact guard assist Goal: Patient Will Transfer Sit To/From Stand Outcome: Progressing Flowsheets (Taken 04/04/2024 1418) Patient will transfer sit to/from stand:  with supervision  with contact guard assist Goal: Pt Will Transfer Bed To Chair/Chair To Bed Outcome: Progressing Flowsheets (Taken 04/04/2024 1418) Pt will Transfer Bed to Chair/Chair to Bed:  with supervision  with contact guard assist Goal: Pt Will Ambulate Outcome: Progressing Flowsheets (Taken 04/04/2024 1418) Pt will Ambulate:  25 feet  with minimal assist  with contact guard assist  with rolling walker   2:19 PM, 04/04/24 Ricky Johnston, MPT Physical Therapist with [facility] [phone] office [phone] mobile phone

## 2024-04-04 NOTE — Inpatient Diabetes Management (Signed)
 Inpatient Diabetes Program Recommendations  AACE/ADA: New Consensus Statement on Inpatient Glycemic Control   Target Ranges:  Prepandial:   less than 140 mg/dL      Peak postprandial:   less than 180 mg/dL (1-2 hours)      Critically ill patients:  140 - 180 mg/dL    Latest Reference Range & Units 04/03/24 16:28 04/04/24 07:49  Glucose-Capillary 70 - 99 mg/dL 161 (H) 096 (H)    Review of Glycemic Control  Diabetes history: DM2 Outpatient Diabetes medications: Humulin R  U500 60 units with breakfast, 75 units with lunch, 30 units with supper; Mounjaro 12.5 mg Qweek (Sunday) Current orders for Inpatient glycemic control: Humulin R  U500 100 units with breakfast, 100 units with lunch, 50 units with supper, Novolog  0-20 units TID with meals  Inpatient Diabetes Program Recommendations:    Insulin : Please consider decreasing Humulin R  U500 to 60 units with breakfast, 75 units with lunch, and 30 units with supper.  NOTE: In reviewing chart, noted patient had telephone visit with C. Kovalick, PA-C at [facility] on 04/02/24. Per office note patient was asked to:  CONTINUE tirzepatide 12.5mg  weekly  - INCREASE U500 to 65 units w/ breaskfast - INCREASE U500 to 85 units w/ lunch - TAKE U500 20 units with supper  * if glucose >250 take 30 units * if not eating take 10 units   Spoke with patient at bedside. Patient confirms that he sees Endocrinology at [facility] and just had a telephone visit on 04/02/24. Patient reports that his Humulin R  U500 dosages were increased  on 04/02/24 and he was instructed to take Humulin R  U500 60 units with breakfast, 75 units with lunch, and 30 units with supper and to continue Mounjaro 12.5 mg Qweek. Discussed that [facility]24 he was asked to increase U500 to 65 units with breakfast, 85 units with lunch, and 20 units with supper (take 30 units if CBG over 250 mg/dl; take 10 units if not eating).  Patient states the U500 was increased and he will double check the instructions he  was given. Patient reports he is using Dexcom G7 CGM and his sugars have been consistently over 200 mg/dl lately which is why the U500 doses were increased. Patient currently has on Dexcom G7 sensor on right arm and currently reading 300 mg/dl. Patient states he ate well at breakfast this morning. Discussed that he has received Humulin R  U500 100 units this morning. Asked that he call nurse if he starts to feel like his glucose is getting low or if alerted for hypoglycemia via Dexcom G7. Patient reports he has all needed DM medications and supplies at home.  Patient verbalized understanding and has no questions at this time.  Thanks, Beacher Limerick, RN, MSN, CDCES Diabetes Coordinator Inpatient Diabetes Program [phone] (Team Pager from 8am to 5pm)

## 2024-04-04 NOTE — TOC Progression Note (Signed)
 Transition of Care (TOC) - Progression Note    Patient Details  Name: Ricky Johnston. MRN: [id number] Date of Birth: 01-05-1951  Transition of [facility]) CM/SW Contact  Linnea Richards, LCSW Phone Number: 04/04/2024, 11:43 AM  Clinical Narrative:     TOC following. PT recommendation is for HHPT at dc. Reviewed with pt who is agreeable to this plan. Pt already active with [facility] and he would like to resume their services upon dc.  TOC will follow and continue to assist with dc planning.  Expected Discharge Plan: Skilled Nursing Facility Barriers to Discharge: Continued Medical Work up  Expected Discharge Plan and Services In-house Referral: Clinical Social Work Discharge Planning Services: CM Consult Post Acute Care Choice: Skilled Nursing Facility Living arrangements for the past 2 months: Single Family Home                                       Social Determinants of Health (SDOH) Interventions SDOH Screenings   Food Insecurity: Low Risk  (01/04/2024)   Received from [facility]  Housing: Low Risk  (04/03/2024)  Transportation Needs: No Transportation Needs (01/04/2024)   Received from [facility]  Utilities: Not At Risk (04/03/2024)  Depression (PHQ2-9): Low Risk  (11/08/2021)  Tobacco Use: Medium Risk (04/04/2024)    Readmission Risk Interventions    04/03/2024    8:38 PM  Readmission Risk Prevention Plan  Transportation Screening Complete  Medication Review (RN Care Manager) Complete  PCP or Specialist appointment within 3-5 days of discharge Complete  HRI or Home Care Consult Complete  SW Recovery Care/Counseling Consult Complete  Palliative Care Screening Not Applicable  Skilled Nursing Facility Complete

## 2024-04-04 NOTE — Evaluation (Signed)
 Physical Therapy Evaluation Patient Details Name: Ricky HOLDMAN Sr. MRN: [id number] DOB: 12/16/1950 Today's Date: 04/04/2024  History of Present Illness  Ricky SIBBALD Sr. Is a 73 year old male with extensive history of HTN, HLD, chronic systolic CHF, CAD, COPD, with chronically dependent 2L O2, OSA, on CPAP, DM2, GERD, anemia of chronic disease, morbid obesity, cardiac, bladder cancer under treatment now.  Presenting with progressive generalized weakness for the past 3 days.  Uses walker at baseline, now wife uses Nurse, adult.  And confusion.  Wife confirms that he is usually on 2 L of oxygen via nasal cannula, for CHF and COPD, now having more shortness of breath, lower extremity edema.  Poor p.o. intake in the past 3 days.   Clinical Impression  Patient required increased time with labored movement for sitting up at bedside, slightly agitated, but follows directions appropriately, able to take a few steps forward backwards at bedside before having to sit due to fatigue. Patient tolerated sitting up in chair after therapy with spouse present. Patient will benefit from continued skilled physical therapy in hospital and recommended venue below to increase strength, balance, endurance for safe ADLs and gait.          If plan is discharge home, recommend the following: A lot of help with walking and/or transfers;A little help with bathing/dressing/bathroom;Help with stairs or ramp for entrance;Assistance with cooking/housework   Can travel by private vehicle        Equipment Recommendations None recommended by PT  Recommendations for Other Services       Functional Status Assessment Patient has had a recent decline in their functional status and demonstrates the ability to make significant improvements in function in a reasonable and predictable amount of time.     Precautions / Restrictions Precautions Precautions: Fall Restrictions Weight Bearing Restrictions Per Provider Order: No       Mobility  Bed Mobility Overal bed mobility: Needs Assistance Bed Mobility: Supine to Sit     Supine to sit: Min assist, Mod assist     General bed mobility comments: increased time, labored movement    Transfers Overall transfer level: Needs assistance Equipment used: Rolling walker (2 wheels) Transfers: Sit to/from Stand, Bed to chair/wheelchair/BSC Sit to Stand: Contact guard assist, Min assist   Step pivot transfers: Contact guard assist, Min assist       General transfer comment: slow labored movement    Ambulation/Gait Ambulation/Gait assistance: Min assist Gait Distance (Feet): 10 Feet Assistive device: Rolling walker (2 wheels) Gait Pattern/deviations: Decreased step length - right, Decreased step length - left, Decreased stride length, Trunk flexed Gait velocity: slow     General Gait Details: able to take a few steps forward/backwards before having to sit due to c/o fatigue, weakness  Stairs            Wheelchair Mobility     Tilt Bed    Modified Rankin (Stroke Patients Only)       Balance Overall balance assessment: Needs assistance Sitting-balance support: Feet supported, No upper extremity supported Sitting balance-Leahy Scale: Fair Sitting balance - Comments: fair/good seated at EOB   Standing balance support: Reliant on assistive device for balance, During functional activity, Bilateral upper extremity supported Standing balance-Leahy Scale: Poor Standing balance comment: fair/poor using RW                             Pertinent Vitals/Pain Pain Assessment Pain Assessment: No/denies pain  Home Living Family/patient expects to be discharged to:: Private residence Living Arrangements: Spouse/significant other Available Help at Discharge: Family;Available 24 hours/day Type of Home: House Home Access: Ramped entrance       Home Layout: One level Home Equipment: Rollator (4 wheels);Cane - single point;Shower  seat;Grab bars - toilet;Grab bars - tub/shower;Other (comment);Wheelchair - manual      Prior Function Prior Level of Function : Needs assist       Physical Assist : Mobility (physical);ADLs (physical) Mobility (physical): Bed mobility;Transfers;Gait   Mobility Comments: Assisted household ambulation using RW, uses wheelchair for longer distances ADLs Comments: Assisted by family     Extremity/Trunk Assessment   Upper Extremity Assessment Upper Extremity Assessment: Defer to OT evaluation    Lower Extremity Assessment Lower Extremity Assessment: Generalized weakness    Cervical / Trunk Assessment Cervical / Trunk Assessment: Kyphotic  Communication   Communication Communication: No apparent difficulties    Cognition Arousal: Alert Behavior During Therapy: WFL for tasks assessed/performed, Agitated   PT - Cognitive impairments: No apparent impairments                         Following commands: Intact       Cueing Cueing Techniques: Verbal cues, Tactile cues     General Comments      Exercises     Assessment/Plan    PT Assessment Patient needs continued PT services  PT Problem List Decreased strength;Decreased activity tolerance;Decreased balance;Decreased mobility       PT Treatment Interventions DME instruction;Gait training;Functional mobility training;Therapeutic activities;Therapeutic exercise;Balance training;Patient/family education    PT Goals (Current goals can be found in the Care Plan section)  Acute Rehab PT Goals Patient Stated Goal: return home with family to assist PT Goal Formulation: With patient/family Time For Goal Achievement: 04/11/24 Potential to Achieve Goals: Good    Frequency Min 3X/week     Co-evaluation               AM-PAC PT "6 Clicks" Mobility  Outcome Measure Help needed turning from your back to your side while in a flat bed without using bedrails?: A Little Help needed moving from lying on your  back to sitting on the side of a flat bed without using bedrails?: A Lot Help needed moving to and from a bed to a chair (including a wheelchair)?: A Little Help needed standing up from a chair using your arms (e.g., wheelchair or bedside chair)?: A Little Help needed to walk in hospital room?: A Lot Help needed climbing 3-5 steps with a railing? : A Lot 6 Click Score: 15    End of Session Equipment Utilized During Treatment: Oxygen Activity Tolerance: Patient tolerated treatment well;Patient limited by fatigue Patient left: in chair;with call bell/phone within reach;with family/visitor present Nurse Communication: Mobility status PT Visit Diagnosis: Unsteadiness on feet (R26.81);Other abnormalities of gait and mobility (R26.89);Muscle weakness (generalized) (M62.81)    Time: 1610-9604 PT Time Calculation (min) (ACUTE ONLY): 29 min   Charges:   PT Evaluation $PT Eval Moderate Complexity: 1 Mod PT Treatments $Therapeutic Activity: 23-37 mins PT General Charges $$ ACUTE PT VISIT: 1 Visit         2:17 PM, 04/04/24 Walton Guppy, MPT Physical Therapist with [facility] [phone] office [phone] mobile phone

## 2024-04-04 NOTE — Consult Note (Signed)
 Consultation Note Date: 04/04/2024   Patient Name: Ricky Johnston  DOB: 1951-05-07  MRN: [id number]  Age / Sex: 73 y.o., male  PCP: [facility] Referring Physician: Bobbetta Burnet, MD  Reason for Consultation: Establishing goals of care  HPI/Patient Profile: 73 y.o. male  with past medical history of HTN, HLD, chronic systolic CHF, CAD, COPD, with chronically dependent 2L O2, OSA, on CPAP, DM2, GERD, anemia of chronic disease, morbid obesity, cardiac, bladder cancer under treatment admitted on 04/03/2024 with sepsis UTI, history of bladder/urothelial carcinoma.   Clinical Assessment and Goals of Care: Face-to-face conference with bedside nursing staff and transitional care team related to patient condition, needs.  I have reviewed medical records including EPIC notes, labs and imaging, received report from RN, assessed the patient.  Ricky Johnston is lying in bed.  He appears acutely/chronically ill, obese.  He greets me, making and mostly keeping eye contact.  He is alert and oriented x 3, able to make his needs known.  There is no family at bedside at this time.   We needed the bedside to discuss diagnosis prognosis, GOC, EOL wishes, disposition and options. I introduced Palliative Medicine as specialized medical care for people living with serious illness. It focuses on providing relief from the symptoms and stress of a serious illness. The goal is to improve quality of life for both the patient and the family.  We discussed a brief life review of the patient.  Ricky Johnston is retired from the [employer] after 21 years.  He has been married to Ricky Johnston for 23 years.  They each have children, but none together.  He shares that he has poor mobility, but is able to do his own bathing and dressing.  He shares that "the [place]" does the housework.  He states agreement that he has needed equipment from the  [place].  I ask why he has a Product/process development scientist and he states that when he falls, he needs that to help him get up off the floor.  We then focused on their current illness.  When I ask what brings him to the hospital he tells me he does not live.  We talked about UTI and sepsis.  We talked about the treatment plan.  We talked about his urothelial/bladder cancer and the treatment plan.  We talked about time for outcomes.  He states that his preference is to return to his own home with home health services already in place through [facility].  The natural disease trajectory and expectations at EOL were discussed.  Advanced directives, concepts specific to code status, artifical feeding and hydration, and rehospitalization were considered and discussed.  We talked about the concept of "treat the treatable, but allow a natural passing".  Initially, he states that he would want attempted resuscitation.  When I ask how long he would accept life support, he then states that he is not sure that he would want to be on machines.  I greatly encouraged him to continue these discussions with  his wife/HCPOA.  He states that he will have them tonight.  Palliative Care services outpatient were explained and offered.  We talked about the benefits of outpatient palliative services for further support and goals of care discussions.  He is considering.  Discussed the importance of continued conversation with family and the medical providers regarding overall plan of care and treatment options, ensuring decisions are within the context of the patient's values and GOCs.  Questions and concerns were addressed.  Hard Choices booklet left for review. The family was encouraged to call with questions or concerns.  PMT will continue to support holistically.   HCPOA  NEXT OF KIN -wife of 23 years, Ricky Johnston.    SUMMARY OF RECOMMENDATIONS   Continue to treat the treatable. Time for outcomes. Return home if possible Considering CODE  STATUS Considering outpatient palliative services   Code Status/Advance Care Planning: Full code -  We talked about the concept of "treat the treatable, but allow a natural passing".  Initially, he states that he would want attempted resuscitation.  When I ask how long he would accept life support, he then states that he is not sure that he would want to be on machines.  I greatly encouraged him to continue these discussions with his wife/HCPOA.  He states that he will have them tonight.  Symptom Management:  Per hospitalist, no additional needs at this time.  Palliative Prophylaxis:  Frequent Pain Assessment and Oral Care  Additional Recommendations (Limitations, Scope, Preferences): Full Scope Treatment  Psycho-social/Spiritual:  Desire for further Chaplaincy support:no Additional Recommendations: Caregiving  Support/Resources and Education on Hospice  Prognosis:  Unable to determine, based on outcomes.  6 months or more would not be surprising based on current condition.  Discharge Planning: Anticipate home with home health, would benefit from outpatient palliative services, considering      Primary Diagnoses: Present on Admission:  Sepsis (HCC)  Urothelial carcinoma (HCC)  OSA (obstructive sleep apnea)  Impaired functional mobility, balance, gait, and endurance  Hyperlipidemia associated with type 2 diabetes mellitus (HCC)  HTN (hypertension)  Esophageal reflux  COPD (chronic obstructive pulmonary disease) (HCC)  CKD stage 3a, GFR 45-59 ml/min (HCC)  Chronic systolic heart failure (HCC)  Bladder cancer (HCC)  A-fib (HCC)   I have reviewed the medical record, interviewed the patient and family, and examined the patient. The following aspects are pertinent.  Past Medical History:  Diagnosis Date   Arthritis    Cancer (HCC)    bladder cancer currently 2016   CHF (congestive heart failure) (HCC)    Complication of anesthesia    hard for him to wake up from  Anesthesia from left nephrectomy   COPD (chronic obstructive pulmonary disease) (HCC)    Coronary artery disease    Diabetes mellitus without complication (HCC)    DVT (deep venous thrombosis) (HCC) 01/08/2014, 2010   upper extremity   GERD (gastroesophageal reflux disease)    Hallux limitus 05/18/2015   from notes from [facility]    Headache    migraines   History of kidney stones    Hypercholesterolemia    Hypertension    Impaired hearing    Intervertebral disc syndrome    Kidney stones    Pulmonary fibrosis (HCC)    Renal calculi 01/08/2014   frrom noted from [facility] .in chart   Renal insufficiency 06/19/2023   Restless legs 01/08/2014   Sciatic leg pain    paralysis of sciatic nerve   Sleep apnea  CPAP/BIPAP   Tinnitus    Social History   Socioeconomic History   Marital status: Married    Spouse name: Not on file   Number of children: Not on file   Years of education: Not on file   Highest education level: Not on file  Occupational History   Not on file  Tobacco Use   Smoking status: Former    Current packs/day: 0.00    Average packs/day: 1 pack/day for 33.0 years (33.0 ttl pk-yrs)    Types: Cigarettes    Start date: 04/07/1967    Quit date: 04/06/2000    Years since quitting: 24.0   Smokeless tobacco: Never   Tobacco comments:    use to chew cigars  Substance and Sexual Activity   Alcohol use: Not Currently    Comment: rarely   Drug use: Never    Comment: patient states does not use illegal drugs   Sexual activity: Not on file  Other Topics Concern   Not on file  Social History Narrative   Not on file   Social Drivers of Health   Financial Resource Strain: Not on file  Food Insecurity: Low Risk  (01/04/2024)   Received from [facility]   Hunger Vital Sign    Worried About Running Out of Food in the Last Year: Never true    Ran Out of Food in the Last Year: Never true  Transportation Needs: No Transportation Needs (01/04/2024)   Received from  [facility]    In the past 12 months, has lack of reliable transportation kept you from medical appointments, meetings, work or from getting things needed for daily living? : No  Physical Activity: Not on file  Stress: Not on file  Social Connections: Not on file   History reviewed. No pertinent family history. Scheduled Meds:  apixaban   5 mg Oral BID   atorvastatin   80 mg Oral QHS   Chlorhexidine  Gluconate Cloth  6 each Topical Daily   ferrous sulfate   325 mg Oral QPM   fluticasone  furoate-vilanterol  1 puff Inhalation Daily   [START ON 04/05/2024] furosemide   20 mg Oral Daily   gabapentin   600 mg Oral BID   insulin  aspart  0-20 Units Subcutaneous TID WC   insulin  regular human CONCENTRATED  100 Units Subcutaneous Q breakfast   And   insulin  regular human CONCENTRATED  100 Units Subcutaneous Q lunch   And   insulin  regular human CONCENTRATED  50 Units Subcutaneous Q supper   ipratropium  0.5 mg Nebulization Q6H   [START ON 04/05/2024] ketoconazole  1 Application Topical Q M,W,F   levalbuterol  0.63 mg Nebulization Q6H   sodium chloride  flush  3 mL Intravenous Q12H   tamsulosin   0.8 mg Oral QHS   Continuous Infusions:  cefTRIAXone  (ROCEPHIN )  IV 2 g (04/04/24 0825)   lactated ringers  75 mL/hr at 04/04/24 0411   PRN Meds:.acetaminophen  **OR** acetaminophen , bisacodyl, hydrALAZINE , HYDROmorphone  (DILAUDID ) injection, ondansetron  **OR** ondansetron  (ZOFRAN ) IV, oxyCODONE , senna-docusate, sodium phosphate, traZODone Medications Prior to Admission:  Prior to Admission medications   Medication Sig Start Date End Date Taking? Authorizing Provider  albuterol  (PROVENTIL  HFA;VENTOLIN  HFA) 108 (90 Base) MCG/ACT inhaler Inhale 2 puffs into the lungs every 6 (six) hours as needed for wheezing or shortness of breath.   Yes [provider]  apixaban  (ELIQUIS ) 5 MG TABS tablet Take 1 tablet (5 mg total) by mouth 2 (two) times daily. 06/25/23 04/03/24 Yes Pokhrel, Amador Bad, MD  atorvastatin  (LIPITOR ) 80 MG tablet Take 40 mg by mouth at bedtime.   Yes [provider]  cetirizine (ZYRTEC) 5 MG tablet Take 5 mg by mouth daily as needed for allergies.   Yes [provider]  cholecalciferol  (VITAMIN D ) 1000 UNITS tablet Take 1,000 Units by mouth every morning.   Yes [provider]  ferrous sulfate  325 (65 FE) MG tablet Take 325 mg by mouth 3 (three) times a week.   Yes [provider]  fluocinonide (LIDEX) 0.05 % external solution Apply 1 Application topically daily as needed (itchy rash on scalp).   Yes [provider]  Fluticasone -Salmeterol (ADVAIR) 250-50 MCG/DOSE AEPB Inhale 1 puff into the lungs daily. 05/18/23  Yes Cala Castleman, MD  gabapentin  (NEURONTIN ) 100 MG capsule Take 100 mg by mouth 2 (two) times daily.   Yes [provider]  Glucagon, rDNA, (GLUCAGON EMERGENCY) 1 MG KIT Inject 1 mg into the skin as needed (low bs).   Yes [provider]  Hydrocortisone  2 % CREA Apply 1 Application topically 2 (two) times daily as needed (rash on face).   Yes [provider]  insulin  regular human CONCENTRATED (HUMULIN R ) 500 UNIT/ML KwikPen Inject 50-100 Units into the skin See admin instructions. Inject 100 units subcutaneously with breakfast, 100 units subcutaneously with lunch and 50 units subcutaneously with dinner. 06/14/22  Yes [provider]  ketoconazole (NIZORAL) 2 % cream Apply 1 Application topically in the morning and at bedtime.   Yes [provider]  ketoconazole (NIZORAL) 2 % shampoo Apply 1 Application topically 3 (three) times a week.   Yes [provider]  OXYGEN Inhale 2-3 L/min into the lungs See admin instructions. 2 L/min continuous. May increase to 3 L/min as needed for shortness of breath.   Yes [provider]  rOPINIRole  (REQUIP ) 1 MG tablet Take 1 mg by mouth at bedtime.   Yes [provider]  tamsulosin  (FLOMAX ) 0.4 MG CAPS capsule  Take 0.4 mg by mouth at bedtime.   Yes [provider]  Tiotropium Bromide  Monohydrate (SPIRIVA  RESPIMAT) 2.5 MCG/ACT AERS Inhale 2 each into the lungs daily as needed (wheezing, shortness of breath.).   Yes [provider]  tirzepatide Florence Hunt) 12.5 MG/0.5ML Pen Inject 12.5 mg into the skin once a week.   Yes [provider]   Allergies  Allergen Reactions   Empagliflozin  Other (See Comments)    Urinary tract infectious disease   Poison Ivy Extract [Poison Ivy Extract] Rash   Poison Oak Extract Rash   Review of Systems  Unable to perform ROS: Mental status change    Physical Exam Vitals and nursing note reviewed.  Constitutional:      General: He is not in acute distress.    Appearance: He is obese. He is ill-appearing.  HENT:     Mouth/Throat:     Mouth: Mucous membranes are moist.  Cardiovascular:     Rate and Rhythm: Normal rate.  Pulmonary:     Effort: No respiratory distress.  Skin:    General: Skin is warm and dry.  Neurological:     Mental Status: He is alert and oriented to person, place, and time.  Psychiatric:        Mood and Affect: Mood normal.        Behavior: Behavior normal.     Vital Signs: BP (!) 116/44   Pulse (!) 50   Temp 98.1 F (36.7 C) (Axillary)   Resp (!) 24  Ht 5\' 8"  (1.727 m)   Wt 115.9 kg   SpO2 100%   BMI 38.85 kg/m  Pain Scale: 0-10   Pain Score: Asleep   SpO2: SpO2: 100 % O2 Device:SpO2: 100 % O2 Flow Rate: .O2 Flow Rate (L/min): 2 L/min  IO: Intake/output summary:  Intake/Output Summary (Last 24 hours) at 04/04/2024 1008 Last data filed at 04/04/2024 0425 Gross per 24 hour  Intake 1275.5 ml  Output 200 ml  Net 1075.5 ml    LBM: Last BM Date : 04/03/24 (Per pt) Baseline Weight: Weight: 113.4 kg Most recent weight: Weight: 115.9 kg     Palliative Assessment/Data:     Time In: 0830     Time Out: 0945 Time Total: 75 minutes  Greater than 50%  of this time was spent counseling and  coordinating care related to the above assessment and plan.  Signed by: Annabelle Barrack, NP   Please contact Palliative Medicine Team phone at [phone] for questions and concerns.  For individual provider: See Tilford Foley

## 2024-04-04 NOTE — Progress Notes (Signed)
 PHARMACY - PHYSICIAN COMMUNICATION CRITICAL VALUE ALERT - BLOOD CULTURE IDENTIFICATION (BCID)  Ricky Johnston. is an 73 y.o. male who presented to [facility] on 04/03/2024   Assessment:  Gram + cocci in 1/4 bottles. BCID staph species. Likely contaminant    Current antibiotics: Ampicillin for UTI  Changes to prescribed antibiotics recommended:  Patient is on recommended antibiotics - No changes needed  Results for orders placed or performed during the hospital encounter of 04/03/24  Blood Culture ID Panel (Reflexed) (Collected: 04/03/2024 12:05 PM)  Result Value Ref Range   Enterococcus faecalis NOT DETECTED NOT DETECTED   Enterococcus Faecium NOT DETECTED NOT DETECTED   Listeria monocytogenes NOT DETECTED NOT DETECTED   Staphylococcus species DETECTED (A) NOT DETECTED   Staphylococcus aureus (BCID) NOT DETECTED NOT DETECTED   Staphylococcus epidermidis NOT DETECTED NOT DETECTED   Staphylococcus lugdunensis NOT DETECTED NOT DETECTED   Streptococcus species NOT DETECTED NOT DETECTED   Streptococcus agalactiae NOT DETECTED NOT DETECTED   Streptococcus pneumoniae NOT DETECTED NOT DETECTED   Streptococcus pyogenes NOT DETECTED NOT DETECTED   A.calcoaceticus-baumannii NOT DETECTED NOT DETECTED   Bacteroides fragilis NOT DETECTED NOT DETECTED   Enterobacterales NOT DETECTED NOT DETECTED   Enterobacter cloacae complex NOT DETECTED NOT DETECTED   Escherichia coli NOT DETECTED NOT DETECTED   Klebsiella aerogenes NOT DETECTED NOT DETECTED   Klebsiella oxytoca NOT DETECTED NOT DETECTED   Klebsiella pneumoniae NOT DETECTED NOT DETECTED   Proteus species NOT DETECTED NOT DETECTED   Salmonella species NOT DETECTED NOT DETECTED   Serratia marcescens NOT DETECTED NOT DETECTED   Haemophilus influenzae NOT DETECTED NOT DETECTED   Neisseria meningitidis NOT DETECTED NOT DETECTED   Pseudomonas aeruginosa NOT DETECTED NOT DETECTED   Stenotrophomonas maltophilia NOT DETECTED NOT DETECTED    Candida albicans NOT DETECTED NOT DETECTED   Candida auris NOT DETECTED NOT DETECTED   Candida glabrata NOT DETECTED NOT DETECTED   Candida krusei NOT DETECTED NOT DETECTED   Candida parapsilosis NOT DETECTED NOT DETECTED   Candida tropicalis NOT DETECTED NOT DETECTED   Cryptococcus neoformans/gattii NOT DETECTED NOT DETECTED    Alicia Apa 04/04/2024  1:00 PM

## 2024-04-04 NOTE — Progress Notes (Signed)
 PROGRESS NOTE    Patient: Ricky WICHERT Sr.                            PCP: [facility]                    DOB: May 04, 1951            DOA: 04/03/2024 [id number]             DOS: 04/04/2024, 11:03 AM   LOS: 1 day   Date of Service: The patient was seen and examined on 04/04/2024  Subjective:   The patient was seen and examined this morning. Much improved mentation, awake alert, oriented. Compliant by BiPAP overnight Mildly tachypneic with respirate of 24, bradycardic with heart rate of 50, blood pressure stable at 116/44, satting 100% on room air,   Brief Narrative:   Ricky Poster Sr. Is a 73 year old male with extensive history of HTN, HLD, chronic systolic CHF, CAD, COPD, with chronically dependent 2L O2, OSA, on CPAP, DM2, GERD, anemia of chronic disease, morbid obesity, cardiac, bladder cancer under treatment now. Presenting with progressive generalized weakness for the past 3 days.  Uses walker at baseline, now wife uses Nurse, adult.  And confusion. Wife confirms that he is usually on 2 L of oxygen via nasal cannula, for CHF and COPD, now having more shortness of breath, lower extremity edema.  Poor p.o. intake in the past 3 days.   ED Evaluation/course: Blood pressure (!) 104/48, Pulse (!) 58, Temp. 98.4 F (36.9 C), RR (!) 29, height 5\' 8"  (1.727 m), weight 113.4 kg, SpO2 94%.  Labs: Sodium 134, potassium 5.2, chloride 94, glucose 213, BUN 52, creatinine 2.87, calcium  8.5, BNP 153, troponin 26, 23, WBC 6.4, UA: Cloudy, large hemoglobin, leukocyte Estrace, rare bacteria, WBC >50 Chest x-ray-cardiomegaly, vascular congestion, possible pulmonary edema  Patient met sepsis criteria due to UTI, requested to be admitted for IV antibiotics, IV fluids    Assessment & Plan:   Principal Problem:   Sepsis (HCC) Active Problems:   Urothelial carcinoma (HCC)   COPD (chronic obstructive pulmonary disease) (HCC)   Chronic systolic heart failure (HCC)   HTN  (hypertension)   CKD stage 3a, GFR 45-59 ml/min (HCC)   Morbid obesity with BMI of 40.0-44.9, adult (HCC)   Hyperlipidemia associated with type 2 diabetes mellitus (HCC)   Esophageal reflux   OSA (obstructive sleep apnea)   Impaired functional mobility, balance, gait, and endurance   Bladder cancer (HCC)   Renal insufficiency   A-fib (HCC)     Assessment and Plan: * Sepsis (HCC) POA: Met sepsis criteria hypotension, tachycardia, tachypnea Source of infection UTI Blood pressure (!) 116/44, pulse (!) 50, Temp. 98.1 F RR (!) 24,  SpO2 100% 2 Ls    - Continuing gentle IV fluid hydration IV antibiotics of Rocephin  -Urine culturr >> - Blood cultures>>>  -Will continue IV antibiotics Rocephin  2 g daily -WBC 6.4, 4.0 lactic acid 0.9>> 1.0 - Will be judicious with IV fluid hydration due to history of CHF, and congestion.  Bladder cancer Urothelial carcinoma (HCC) -as needed  Bladder/urethral cath -Patient is to continue outpatient follow-up and treatment - Undertreatment last chemo 04/08/24  CT - stone IMPRESSION: *Right lower lobe atelectasis and right pleural reaction. *Cholelithiasis without gallbladder inflammatory changes. *Left kidney is absent. *Diffuse bladder wall thickening with a bubble of air in the bladder that could correlate with  recent instrumentation. Findings could correlate with chronic bladder outlet obstruction. *Fecal impaction rectum without obstruction. *Aortic atherosclerosis.   COPD (chronic obstructive pulmonary disease) (HCC)/ w OSA  - Chronic respiratory failure, on 2 L of oxygen at baseline -Required up to 4 L of oxygen on this admission, BiPAP nightly -Continue O2 sat 92-88%, as needed DuoNeb,   Chronic systolic heart failure (HCC) Chronic systolic congestive heart failure with preserved ejection fraction -Last echo: 06/21/2023: 50 to 55%, LV function and size The right ventricular size is moderately enlarged. Right ventricular systolic   function is moderately reduced.   HTN (hypertension) POA: Hypotensive due to sepsis, with holding BP meds for now including Aldactone  and Lasix    CKD stage 3a, GFR 45-59 ml/min (HCC) - Avoid nephrotoxins, hypotension -Baseline creatinine around 1.2-2.0  POA.  BUN 52, creatinine 2.87 (1.51 on 06/25/2023), GFR 23 - UA positive for glucose, protein 100, -Will monitor closely   Morbid obesity with BMI of 40.0-44.9, adult (HCC) Body mass index is 38.01 kg/m. 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, 1 functional kidney) BUN/creatinine elevated from baseline Lab Results  Component Value Date   CREATININE 2.70 (H) 04/04/2024   CREATININE 2.87 (H) 04/03/2024   CREATININE 1.51 (H) 06/25/2023    -Voiding nephrotoxin, avoiding hypotension -Adjusting medication to renal dose -Continue gentle fluid hydration  Esophageal reflux Continue PPI  Hyperlipidemia associated with type 2 diabetes mellitus (HCC) - Continue statins    Impaired functional mobility, balance, gait, and endurance - Patient usually ambulates with walker, now requiring assist with all ADLs, including Hoyer lift -Consulted PT OT for evaluation, anticipating rehab, SNF on discharge            ----------------------------------------------------------------------------------------------------------------------------------------------- Nutritional status:  The patient's BMI is: Body mass index is 38.85 kg/m. I agree with the assessment and plan as outlined  Skin Assessment: I have examined the patient's skin and I agree with the wound assessment as performed by wound care team As outlined belowe: Pressure Injury 06/24/23 Coccyx Mid Stage 2 -  Partial thickness loss of dermis presenting as a shallow open injury with a red, pink wound bed  without slough. Dark red area, tiny open area in center (Active)  06/24/23 1842  Location: Coccyx  Location Orientation: Mid  Staging: Stage 2 -  Partial thickness loss of dermis presenting as a shallow open injury with a red, pink wound bed without slough.  Wound Description (Comments): Dark red area, tiny open area in center  Present on Admission:   Dressing Type Foam - Lift dressing to assess site every shift 04/04/24 0301     ------------------------------------------------------------------------------------------------------------------ Cultures; Blood Cultures x 2 >>  Urine Culture  >>>  ------------------------------------------------------------------------------------------------------------------ DVT prophylaxis:  TED hose Start: 04/03/24 1440 SCDs Start: 04/03/24 1440 apixaban  (ELIQUIS ) tablet 5 mg   Code Status:   Code Status: Full Code  Family Communication: Wife present at bedside updated -Advance care planning has been discussed.   Admission status:   Status is: Inpatient Remains inpatient appropriate because: Needing IV fluids, IV antibiotics, treatment for sepsis   Disposition: From  - home             Planning for discharge in 1-2 days: to   Procedures:   No admission procedures for hospital encounter.   Antimicrobials:  Anti-infectives (From admission, onward)  Start     Dose/Rate Route Frequency Ordered Stop   04/04/24 1000  cefTRIAXone  (ROCEPHIN ) 2 g in sodium chloride  0.9 % 100 mL IVPB        2 g 200 mL/hr over 30 Minutes Intravenous Every 24 hours 04/03/24 1444 04/11/24 0959   04/03/24 1230  cefTRIAXone  (ROCEPHIN ) 1 g in sodium chloride  0.9 % 100 mL IVPB        1 g 200 mL/hr over 30 Minutes Intravenous  Once 04/03/24 1218 04/03/24 1349        Medication:   apixaban   5 mg Oral BID   atorvastatin   80 mg Oral QHS   Chlorhexidine  Gluconate Cloth  6 each Topical Daily   ferrous sulfate   325 mg Oral QPM   fluticasone  furoate-vilanterol  1  puff Inhalation Daily   [START ON 04/05/2024] furosemide   20 mg Oral Daily   gabapentin   600 mg Oral BID   insulin  aspart  0-20 Units Subcutaneous TID WC   insulin  regular human CONCENTRATED  100 Units Subcutaneous Q breakfast   And   insulin  regular human CONCENTRATED  100 Units Subcutaneous Q lunch   And   insulin  regular human CONCENTRATED  50 Units Subcutaneous Q supper   ipratropium  0.5 mg Nebulization Q6H   [START ON 04/05/2024] ketoconazole  1 Application Topical Q M,W,F   levalbuterol  0.63 mg Nebulization Q6H   sodium chloride  flush  3 mL Intravenous Q12H   tamsulosin   0.8 mg Oral QHS    acetaminophen  **OR** acetaminophen , bisacodyl, hydrALAZINE , HYDROmorphone  (DILAUDID ) injection, ondansetron  **OR** ondansetron  (ZOFRAN ) IV, oxyCODONE , senna-docusate, sodium phosphate, traZODone   Objective:   Vitals:   04/04/24 0600 04/04/24 0700 04/04/24 0818 04/04/24 0826  BP: (!) 123/46 (!) 116/44    Pulse:  (!) 50    Resp: (!) 23 (!) 24    Temp:      TempSrc:      SpO2: (!) 89% (!) 89% 96% 100%  Weight:      Height:        Intake/Output Summary (Last 24 hours) at 04/04/2024 1103 Last data filed at 04/04/2024 0425 Gross per 24 hour  Intake 1275.5 ml  Output 200 ml  Net 1075.5 ml   Filed Weights   04/03/24 1114 04/03/24 2300 04/04/24 0425  Weight: 113.4 kg 115.7 kg 115.9 kg     Physical examination:   General:  AAO x 3,  cooperative, no distress;   HEENT:  Normocephalic, PERRL, otherwise with in Normal limits   Neuro:  CNII-XII intact. , normal motor and sensation, reflexes intact   Lungs:   Clear to auscultation BL, Respirations unlabored,  No wheezes / crackles  Cardio:    S1/S2, RRR, No murmure, No Rubs or Gallops   Abdomen:  Soft, non-tender, bowel sounds active all four quadrants, no guarding or peritoneal signs.  Muscular  skeletal:  Limited exam -global generalized weaknesses - in bed, able to move all 4 extremities,   2+ pulses,  symmetric, No pitting edema   Skin:  Dry, warm to touch, negative for any Rashes,  Wounds: Please see nursing documentation  Pressure Injury 06/24/23 Coccyx Mid Stage 2 -  Partial thickness loss of dermis presenting as a shallow open injury with a red, pink wound bed without slough. Dark red area, tiny open area in center (Active)  06/24/23 1842  Location: Coccyx  Location Orientation: Mid  Staging: Stage 2 -  Partial thickness loss of dermis presenting as a shallow open injury  with a red, pink wound bed without slough.  Wound Description (Comments): Dark red area, tiny open area in center  Present on Admission:   Dressing Type Foam - Lift dressing to assess site every shift 04/04/24 0301          ------------------------------------------------------------------------------------------------------------------------------------------    LABs:     Latest Ref Rng & Units 04/04/2024    4:01 AM 04/03/2024   12:00 PM 06/25/2023    1:33 AM  CBC  WBC 4.0 - 10.5 K/uL 4.0  6.4  6.7   Hemoglobin 13.0 - 17.0 g/dL 16.1  09.6  04.5   Hematocrit 39.0 - 52.0 % 40.3  41.3  38.6   Platelets 150 - 400 K/uL 122  117  221       Latest Ref Rng & Units 04/04/2024    4:01 AM 04/03/2024   12:00 PM 06/25/2023    1:33 AM  CMP  Glucose 70 - 99 mg/dL 409  811  914   BUN 8 - 23 mg/dL 56  52  30   Creatinine 0.61 - 1.24 mg/dL 7.82  9.56  2.13   Sodium 135 - 145 mmol/L 138  134  140   Potassium 3.5 - 5.1 mmol/L 5.3  5.2  4.0   Chloride 98 - 111 mmol/L 101  94  94   CO2 22 - 32 mmol/L 27  29  33   Calcium  8.9 - 10.3 mg/dL 8.1  8.5  8.9   Total Protein 6.5 - 8.1 g/dL 6.3  6.8    Total Bilirubin 0.0 - 1.2 mg/dL 0.5  1.3    Alkaline Phos 38 - 126 U/L 66  75    AST 15 - 41 U/L 17  21    ALT 0 - 44 U/L 17  18         Micro Results Recent Results (from the past 240 hours)  Culture, blood (routine x 2)     Status: None (Preliminary result)   Collection Time: 04/03/24 12:00 PM   Specimen: BLOOD  Result Value Ref Range Status    Specimen Description BLOOD LEFT ARM  Final   Special Requests   Final    BOTTLES DRAWN AEROBIC AND ANAEROBIC Blood Culture results may not be optimal due to an inadequate volume of blood received in culture bottles   Culture   Final    NO GROWTH < 24 HOURS Performed at [facility], [address] [place], [place] [address]    Report Status PENDING  Incomplete  Resp panel by RT-PCR (RSV, Flu A&B, Covid) Anterior Nasal Swab     Status: None   Collection Time: 04/03/24 12:00 PM   Specimen: Anterior Nasal Swab  Result Value Ref Range Status   SARS Coronavirus 2 by RT PCR NEGATIVE NEGATIVE Final    Comment: (NOTE) SARS-CoV-2 target nucleic acids are NOT DETECTED.  The SARS-CoV-2 RNA is generally detectable in upper respiratory specimens during the acute phase of infection. The lowest concentration of SARS-CoV-2 viral copies this assay can detect is 138 copies/mL. A negative result does not preclude SARS-Cov-2 infection and should not be used as the sole basis for treatment or other patient management decisions. A negative result may occur with  improper specimen collection/handling, submission of specimen other than nasopharyngeal swab, presence of viral mutation(s) within the areas targeted by this assay, and inadequate number of viral copies(<138 copies/mL). A negative result must be combined with clinical observations, patient history, and epidemiological information. The expected  result is Negative.  Fact Sheet for Patients:  BloggerCourse.com  Fact Sheet for Healthcare Providers:  SeriousBroker.it  This test is no t yet approved or cleared by the [place]  FDA and  has been authorized for detection and/or diagnosis of SARS-CoV-2 by FDA under an Emergency Use Authorization (EUA). This EUA will remain  in effect (meaning this test can be used) for the duration of the COVID-19 declaration under Section 564(b)(1) of the Act,  21 U.S.C.section 360bbb-3(b)(1), unless the authorization is terminated  or revoked sooner.       Influenza A by PCR NEGATIVE NEGATIVE Final   Influenza B by PCR NEGATIVE NEGATIVE Final    Comment: (NOTE) The Xpert Xpress SARS-CoV-2/FLU/RSV plus assay is intended as an aid in the diagnosis of influenza from Nasopharyngeal swab specimens and should not be used as a sole basis for treatment. Nasal washings and aspirates are unacceptable for Xpert Xpress SARS-CoV-2/FLU/RSV testing.  Fact Sheet for Patients: BloggerCourse.com  Fact Sheet for Healthcare Providers: SeriousBroker.it  This test is not yet approved or cleared by the [place]  FDA and has been authorized for detection and/or diagnosis of SARS-CoV-2 by FDA under an Emergency Use Authorization (EUA). This EUA will remain in effect (meaning this test can be used) for the duration of the COVID-19 declaration under Section 564(b)(1) of the Act, 21 U.S.C. section 360bbb-3(b)(1), unless the authorization is terminated or revoked.     Resp Syncytial Virus by PCR NEGATIVE NEGATIVE Final    Comment: (NOTE) Fact Sheet for Patients: BloggerCourse.com  Fact Sheet for Healthcare Providers: SeriousBroker.it  This test is not yet approved or cleared by the [place]  FDA and has been authorized for detection and/or diagnosis of SARS-CoV-2 by FDA under an Emergency Use Authorization (EUA). This EUA will remain in effect (meaning this test can be used) for the duration of the COVID-19 declaration under Section 564(b)(1) of the Act, 21 U.S.C. section 360bbb-3(b)(1), unless the authorization is terminated or revoked.  Performed at [facility], [address] [place], [place] [address]   Culture, blood (routine x 2)     Status: None (Preliminary result)   Collection Time: 04/03/24 12:05 PM   Specimen: BLOOD  Result Value Ref  Range Status   Specimen Description   Final    BLOOD LEFT HAND Performed at [facility], [address], [place], [place] [address]    Special Requests   Final    BOTTLES DRAWN AEROBIC AND ANAEROBIC Blood Culture results may not be optimal due to an inadequate volume of blood received in culture bottles Performed at [facility], [address] [place], [place] [address]    Culture  Setup Time   Final    ANAEROBIC BOTTLE ONLY GRAM POSITIVE COCCI Gram Stain Report Called to,Read Back By and Verified With: L IRVING AT 0650 04/04/24 BY A WILSON Organism ID to follow Performed at [facility], [address], [place], [place] [address]    Culture GRAM POSITIVE COCCI  Final   Report Status PENDING  Incomplete  Urine Culture     Status: None (Preliminary result)   Collection Time: 04/03/24  2:00 PM   Specimen: Urine, Clean Catch  Result Value Ref Range Status   Specimen Description   Final    URINE, CLEAN CATCH Performed at [facility], [address], [place], [place] [address]    Special Requests   Final    NONE Performed at [facility], [address], [place], [place] [address]  Culture   Final    CULTURE REINCUBATED FOR BETTER GROWTH Performed at [facility], [address] [place], [place] [address]    Report Status PENDING  Incomplete    Radiology Reports CT RENAL STONE STUDY Result Date: 04/03/2024 CLINICAL DATA:  Sepsis pain kidney stones EXAM: CT ABDOMEN AND PELVIS WITHOUT CONTRAST TECHNIQUE: Multidetector CT imaging of the abdomen and pelvis was performed following the standard protocol without IV contrast. RADIATION DOSE REDUCTION: This exam was performed according to the departmental dose-optimization program which includes automated exposure control, adjustment of the mA and/or kV according to patient size and/or use of iterative reconstruction technique. COMPARISON:  None Available. FINDINGS: Lower chest: Right lower lobe atelectasis right pleural  reaction, no significant pleural effusions Hepatobiliary: Liver normal. No parenchymal lesion no biliary dilatation. 5 mm gallstone neck of the gallbladder without gallbladder inflammatory changes Pancreas: Unremarkable. No pancreatic ductal dilatation or surrounding inflammatory changes. Spleen: Normal in size without focal abnormality. Adrenals/Urinary Tract: Adrenal glands are normal Right kidney normal. Left kidney is absent. Correlate clinically. Bladder demonstrates diffuse bladder wall thickening with a bubble of air in the bladder that could correlate with recent instrumentation. Findings could correlate with chronic bladder outlet obstruction. Prostate gland normal size. Surgical clips in the left renal fossa correlate with prior nephrectomy. Stomach/Bowel: Stomach is within normal limits. Appendix appears normal. No evidence of bowel wall thickening, distention, or inflammatory changes. No appendicitis, no diverticulitis Fecal impaction rectum without obstruction. Vascular/Lymphatic: Aortic atherosclerosis. No enlarged abdominal or pelvic lymph nodes. Reproductive: Prostate is unremarkable. Other: No abdominal wall hernia or abnormality. No abdominopelvic ascites. Musculoskeletal: Multilevel degenerative disc disease lumbosacral spine. No fractures. IMPRESSION: *Right lower lobe atelectasis and right pleural reaction. *Cholelithiasis without gallbladder inflammatory changes. *Left kidney is absent. *Diffuse bladder wall thickening with a bubble of air in the bladder that could correlate with recent instrumentation. Findings could correlate with chronic bladder outlet obstruction. *Fecal impaction rectum without obstruction. *Aortic atherosclerosis. Electronically Signed   By: Fredrich Jefferson M.D.   On: 04/03/2024 17:19   DG Chest Port 1 View Result Date: 04/03/2024 CLINICAL DATA:  Weakness. EXAM: PORTABLE CHEST 1 VIEW COMPARISON:  06/19/2023 FINDINGS: Lung volumes are low. Stable cardiomegaly. Unchanged  mediastinal contours. Vascular congestion. Interstitial accentuation may represent pulmonary edema. No significant pleural effusion. No focal airspace disease. No pneumothorax. IMPRESSION: Cardiomegaly with vascular congestion and possible pulmonary edema. Electronically Signed   By: Chadwick Colonel M.D.   On: 04/03/2024 12:01    SIGNED: Bobbetta Burnet, MD, FHM. FAAFP. Arlin Benes - [facility] Critical care time spent - 55 min.  In seeing, evaluating and examining the patient. Reviewing medical records, labs, drawn plan of care. [facility],  Pager (please use [email or web address] to page/ text) Please use Epic Secure Chat for non-urgent communication (7AM-7PM)  If 7PM-7AM, please contact night-coverage [email or web address], 04/04/2024, 11:03 AM

## 2024-04-04 NOTE — Progress Notes (Signed)
   04/04/24 2231  BiPAP/CPAP/SIPAP  BiPAP/CPAP/SIPAP Pt Type Adult  BiPAP/CPAP/SIPAP [name] (patient's home unit)  Mask Type Nasal pillows (home mask)  Flow Rate 6 lpm  Patient Home Machine Yes  Safety Check Completed by RT for Home Unit Yes, no issues noted  Patient Home Mask Yes  Patient Home Tubing Yes  Auto Titrate Yes  Device Plugged into RED Power Outlet Yes  BiPAP/CPAP /SiPAP Vitals  Pulse Rate (!) 121  Resp (!) 25  SpO2 94 %  Bilateral Breath Sounds Diminished  MEWS Score/Color  MEWS Score 3  MEWS Score Color Yellow

## 2024-04-04 NOTE — Plan of Care (Signed)
  Problem: Fluid Volume: Goal: Hemodynamic stability will improve Outcome: Progressing   Problem: Coping: Goal: Ability to adjust to condition or change in health will improve Outcome: Progressing   Problem: Tissue Perfusion: Goal: Adequacy of tissue perfusion will improve Outcome: Progressing   Problem: Education: Goal: Knowledge of General Education information will improve Description: Including pain rating scale, medication(s)/side effects and non-pharmacologic comfort measures Outcome: Progressing

## 2024-04-05 ENCOUNTER — Inpatient Hospital Stay (HOSPITAL_COMMUNITY)

## 2024-04-05 DIAGNOSIS — R6520 Severe sepsis without septic shock: Secondary | ICD-10-CM | POA: Diagnosis not present

## 2024-04-05 DIAGNOSIS — J9600 Acute respiratory failure, unspecified whether with hypoxia or hypercapnia: Secondary | ICD-10-CM | POA: Diagnosis not present

## 2024-04-05 DIAGNOSIS — A419 Sepsis, unspecified organism: Secondary | ICD-10-CM | POA: Diagnosis not present

## 2024-04-05 LAB — COMPREHENSIVE METABOLIC PANEL WITH GFR
ALT: 17 U/L (ref 0–44)
AST: 17 U/L (ref 15–41)
Albumin: 2.5 g/dL — ABNORMAL LOW (ref 3.5–5.0)
Alkaline Phosphatase: 73 U/L (ref 38–126)
Anion gap: 7 (ref 5–15)
BUN: 64 mg/dL — ABNORMAL HIGH (ref 8–23)
CO2: 30 mmol/L (ref 22–32)
Calcium: 8.2 mg/dL — ABNORMAL LOW (ref 8.9–10.3)
Chloride: 102 mmol/L (ref 98–111)
Creatinine, Ser: 2.66 mg/dL — ABNORMAL HIGH (ref 0.61–1.24)
GFR, Estimated: 25 mL/min — ABNORMAL LOW (ref >60)
Glucose, Bld: 106 mg/dL — ABNORMAL HIGH (ref 70–99)
Potassium: 5.2 mmol/L — ABNORMAL HIGH (ref 3.5–5.1)
Sodium: 139 mmol/L (ref 135–145)
Total Bilirubin: 0.3 mg/dL (ref 0.0–1.2)
Total Protein: 6.4 g/dL — ABNORMAL LOW (ref 6.5–8.1)

## 2024-04-05 LAB — GLUCOSE, CAPILLARY
Glucose-Capillary: 109 mg/dL — ABNORMAL HIGH (ref 70–99)
Glucose-Capillary: 124 mg/dL — ABNORMAL HIGH (ref 70–99)
Glucose-Capillary: 159 mg/dL — ABNORMAL HIGH (ref 70–99)
Glucose-Capillary: 161 mg/dL — ABNORMAL HIGH (ref 70–99)
Glucose-Capillary: 190 mg/dL — ABNORMAL HIGH (ref 70–99)

## 2024-04-05 LAB — CBC
HCT: 40.8 % (ref 39.0–52.0)
Hemoglobin: 11.7 g/dL — ABNORMAL LOW (ref 13.0–17.0)
MCH: 27.8 pg (ref 26.0–34.0)
MCHC: 28.7 g/dL — ABNORMAL LOW (ref 30.0–36.0)
MCV: 96.9 fL (ref 80.0–100.0)
Platelets: 124 10*3/uL — ABNORMAL LOW (ref 150–400)
RBC: 4.21 MIL/uL — ABNORMAL LOW (ref 4.22–5.81)
RDW: 14.1 % (ref 11.5–15.5)
WBC: 4.6 10*3/uL (ref 4.0–10.5)
nRBC: 0 % (ref 0.0–0.2)

## 2024-04-05 LAB — BLOOD GAS, ARTERIAL
Acid-Base Excess: 3.2 mmol/L — ABNORMAL HIGH (ref 0.0–2.0)
Bicarbonate: 31.7 mmol/L — ABNORMAL HIGH (ref 20.0–28.0)
Drawn by: 23430
O2 Saturation: 99.5 %
Patient temperature: 37
pCO2 arterial: 66 mmHg (ref 32–48)
pH, Arterial: 7.29 — ABNORMAL LOW (ref 7.35–7.45)
pO2, Arterial: 104 mmHg (ref 83–108)

## 2024-04-05 LAB — URINE CULTURE: Culture: 80000 — AB

## 2024-04-05 LAB — BLOOD GAS, VENOUS
Acid-Base Excess: 3.8 mmol/L — ABNORMAL HIGH (ref 0.0–2.0)
Bicarbonate: 34 mmol/L — ABNORMAL HIGH (ref 20.0–28.0)
Drawn by: 27160
O2 Saturation: 79.1 %
Patient temperature: 36.9
pCO2, Ven: 85 mmHg (ref 44–60)
pH, Ven: 7.21 — ABNORMAL LOW (ref 7.25–7.43)
pO2, Ven: 45 mmHg (ref 32–45)

## 2024-04-05 LAB — BRAIN NATRIURETIC PEPTIDE: B Natriuretic Peptide: 197 pg/mL — ABNORMAL HIGH (ref 0.0–100.0)

## 2024-04-05 MED ORDER — VANCOMYCIN HCL 1500 MG/300ML IV SOLN: 1500.0000 mg | INTRAVENOUS | Status: DC

## 2024-04-05 MED ORDER — INSULIN REGULAR HUMAN (CONC) 500 UNIT/ML ~~LOC~~ SOPN
30.0000 [IU] | PEN_INJECTOR | Freq: Every day | SUBCUTANEOUS | Status: DC
  Administered 2024-04-06 – 2024-04-08 (×3): 30 [IU] via SUBCUTANEOUS

## 2024-04-05 MED ORDER — INSULIN REGULAR HUMAN (CONC) 500 UNIT/ML ~~LOC~~ SOPN
30.0000 [IU] | PEN_INJECTOR | Freq: Every day | SUBCUTANEOUS | Status: DC
  Administered 2024-04-07 – 2024-04-08 (×2): 30 [IU] via SUBCUTANEOUS

## 2024-04-05 MED ORDER — INSULIN REGULAR HUMAN (CONC) 500 UNIT/ML ~~LOC~~ SOPN
30.0000 [IU] | PEN_INJECTOR | Freq: Every day | SUBCUTANEOUS | Status: DC
  Administered 2024-04-06 – 2024-04-09 (×4): 30 [IU] via SUBCUTANEOUS

## 2024-04-05 MED ORDER — VANCOMYCIN HCL 2000 MG/400ML IV SOLN
2000.0000 mg | Freq: Once | INTRAVENOUS | Status: AC
  Administered 2024-04-05: 2000 mg via INTRAVENOUS
  Filled 2024-04-05: qty 400

## 2024-04-05 NOTE — Progress Notes (Signed)
 PROGRESS NOTE    Patient: Ricky OSORTO Sr.                            PCP: [facility]                    DOB: 15-May-1951            DOA: 04/03/2024 UEA:[id number]             DOS: 04/05/2024, 11:23 AM   LOS: 2 days   Date of Service: The patient was seen and examined on 04/05/2024  Subjective:   The patient was seen and examined this morning, lethargic, apparently noncompliant with his BiPAP overnight, also was hypoglycemic overnight Somnolent, arousable,  CBG 124, 159 this morning, On BiPAP FiO2 40, satting 96% Tachycardia with heart rate of 108, RR of 26, BP 109/44 temp 98.4  Brief Narrative:   Ricky Poster Sr. Is a 73 year old male with extensive history of HTN, HLD, chronic systolic CHF, CAD, COPD, with chronically dependent 2L O2, OSA, on CPAP, DM2, GERD, anemia of chronic disease, morbid obesity, cardiac, bladder cancer under treatment now. Presenting with progressive generalized weakness for the past 3 days.  Uses walker at baseline, now wife uses Nurse, adult.  And confusion. Wife confirms that he is usually on 2 L of oxygen via nasal cannula, for CHF and COPD, now having more shortness of breath, lower extremity edema.  Poor p.o. intake in the past 3 days.   ED Evaluation/course: Blood pressure (!) 104/48, Pulse (!) 58, Temp. 98.4 F (36.9 C), RR (!) 29, height 5\' 8"  (1.727 m), weight 113.4 kg, SpO2 94%.  Labs: Sodium 134, potassium 5.2, chloride 94, glucose 213, BUN 52, creatinine 2.87, calcium  8.5, BNP 153, troponin 26, 23, WBC 6.4, UA: Cloudy, large hemoglobin, leukocyte Estrace, rare bacteria, WBC >50 Chest x-ray-cardiomegaly, vascular congestion, possible pulmonary edema  Patient met sepsis criteria due to UTI, requested to be admitted for IV antibiotics, IV fluids    Assessment & Plan:   Principal Problem:   Sepsis (HCC) Active Problems:   Urothelial carcinoma (HCC)   COPD (chronic obstructive pulmonary disease) (HCC)   Chronic systolic heart  failure (HCC)   HTN (hypertension)   CKD stage 3a, GFR 45-59 ml/min (HCC)   Morbid obesity with BMI of 40.0-44.9, adult (HCC)   Hyperlipidemia associated with type 2 diabetes mellitus (HCC)   Esophageal reflux   OSA (obstructive sleep apnea)   Impaired functional mobility, balance, gait, and endurance   Bladder cancer (HCC)   Renal insufficiency   A-fib (HCC)     Assessment and Plan: * Sepsis (HCC) -Encephalopathic, somnolent this a.m., still meeting sepsis criteria  POA: Met sepsis criteria hypotension, tachycardia, tachypnea Source of infection UTI Afebrile with temp of 98.4, pulse 108, RR 28, BP 109/44, on BiPAP 40% FiO2, satting 96%  - Blood cultures are staph., ?  Contaminant, urine culture growing Enterococcus faecalis  -Switch IV Rocephin  to ampicillin , adding vancomycin  -WBC 6.4, 4.0, 4.6 lactic acid 0.9>> 1.0 - Discontinue IVF  Hyperglycemic with history of DM2, - Hypoglycemic overnight - On concentrated Humulin R  TID, reducing dosage for better glycemic control CBG (last 3)  Recent Labs    04/05/24 0601 04/05/24 0759 04/05/24 0908  GLUCAP 109* 124* 159*     Bladder cancer - Urothelial carcinoma (HCC) -as needed  Bladder/urethral cath -Patient is to continue outpatient follow-up and treatment -  Undertreatment last chemo 04/08/24  CT - stone IMPRESSION: *Right lower lobe atelectasis and right pleural reaction. *Cholelithiasis without gallbladder inflammatory changes. *Left kidney is absent. *Diffuse bladder wall thickening with a bubble of air in the bladder that could correlate with recent instrumentation. Findings could correlate with chronic bladder outlet obstruction. *Fecal impaction rectum without obstruction. *Aortic atherosclerosis.   COPD (chronic obstructive pulmonary disease) (HCC)/ w OSA  - Chronic respiratory failure, on 2 L of oxygen at baseline -Was not compliant with his BiPAP overnight -Obtaining ABG, continue BiPAP -Chest x-ray  reporting congestion  -Continue O2 sat 92-88%, as needed DuoNeb,   Chronic systolic heart failure (HCC) Chronic systolic congestive heart failure with preserved ejection fraction -Last echo: 06/21/2023: 50 to 55%, LV function and size The right ventricular size is moderately enlarged. Right ventricular systolic  function is moderately reduced.  - Discontinuing IVF, chest x-ray reporting congestion,    CKD stage 3a, GFR 45-59 ml/min (HCC) - Avoid nephrotoxins, hypotension -Baseline creatinine around 1.2-2.0  POA.  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 -Discussing regarding fluid management, weight loss, Close follow-up with PCP  A-fib (HCC) H/o Afib - per wife was started on Eliquis  at [facility]  Will continue   Esophageal reflux Continue PPI  Hyperlipidemia associated with type 2 diabetes mellitus (HCC) - Continue statins  HTN (hypertension) POA: Hypotensive due to sepsis, with holding BP meds for now including Aldactone  and Lasix  for now   Impaired functional mobility, balance, gait, and endurance - Patient usually ambulates with walker, now requiring assist with all ADLs, including Hoyer lift -Consulted PT OT for evaluation, anticipating rehab, SNF on discharge   -------------------------------------------------------------------------------------------------------------------------- Nutritional status:  The patient's BMI is: Body mass index is 39.35  kg/m. I agree with the assessment and plan as outlined  Skin Assessment: I have examined the patient's skin and I agree with the wound assessment as performed by wound care team As outlined belowe: Pressure Injury 06/24/23 Coccyx Mid Stage 2 -  Partial thickness loss of dermis presenting as a shallow open injury with a red, pink wound bed without slough. Dark red area, tiny open area in center (Active)  06/24/23 1842  Location: Coccyx  Location Orientation: Mid  Staging: Stage 2 -  Partial thickness loss of dermis presenting as a shallow open injury with a red, pink wound bed without slough.  Wound Description (Comments): Dark red area, tiny open area in center  Present on Admission:      ------------------------------------------------------------------------------------------------------------------ Cultures; Micro Results:  1/4 Blood cultures from 04/03/2024 -reporting staph coccusHomins  Urine culture -growing Enterococcus faecalis Repeating blood cultures 04/05/2024>>>  ------------------------------------------------------------------------------------------------------------------ DVT prophylaxis:  TED hose Start: 04/03/24 1440 SCDs Start: 04/03/24 1440 apixaban  (ELIQUIS ) tablet 5 mg   Code Status:   Code Status: Full Code  Family Communication: Wife present at bedside updated -Advance care planning has been discussed.   Admission status:   Status is: Inpatient Remains inpatient appropriate because: Needing IV fluids, IV antibiotics, treatment for sepsis   Disposition: From  -  home             Planning for discharge in 1-2 days: to   Procedures:   No admission procedures for hospital encounter.   Antimicrobials:  Anti-infectives (From admission, onward)    Start     Dose/Rate Route Frequency Ordered Stop   04/04/24 1400  ampicillin  (OMNIPEN) 2 g in sodium chloride  0.9 % 100 mL IVPB        2 g 300 mL/hr over 20 Minutes Intravenous Every 6 hours 04/04/24 1242      04/04/24 1000  cefTRIAXone  (ROCEPHIN ) 2 g in sodium chloride  0.9 % 100 mL IVPB  Status:  Discontinued        2 g 200 mL/hr over 30 Minutes Intravenous Every 24 hours 04/03/24 1444 04/04/24 1242   04/03/24 1230  cefTRIAXone  (ROCEPHIN ) 1 g in sodium chloride  0.9 % 100 mL IVPB        1 g 200 mL/hr over 30 Minutes Intravenous  Once 04/03/24 1218 04/03/24 1349        Medication:   apixaban   5 mg Oral BID   atorvastatin   80 mg Oral QHS   Chlorhexidine  Gluconate Cloth  6 each Topical Daily   ferrous sulfate   325 mg Oral QPM   fluticasone  furoate-vilanterol  1 puff Inhalation Daily   furosemide   20 mg Oral Daily   gabapentin   400 mg Oral BID   insulin  aspart  0-20 Units Subcutaneous TID WC   insulin  regular human CONCENTRATED  30 Units Subcutaneous Q breakfast   And   insulin  regular human CONCENTRATED  30 Units Subcutaneous Q lunch   And   insulin  regular human CONCENTRATED  30 Units Subcutaneous Q supper   ipratropium  0.5 mg Nebulization Q6H   ketoconazole   1 Application Topical Q M,W,F   levalbuterol   0.63 mg Nebulization Q6H   magnesium  citrate  1 Bottle Oral Once   senna-docusate  1 tablet Oral BID   sodium chloride  flush  3 mL Intravenous Q12H   tamsulosin   0.8 mg Oral QHS    acetaminophen  **OR** acetaminophen , hydrALAZINE , HYDROmorphone  (DILAUDID ) injection, ondansetron  **OR** ondansetron  (ZOFRAN ) IV, oxyCODONE , sodium phosphate, traZODone    Objective:   Vitals:   04/05/24 0800 04/05/24 0841 04/05/24 0854 04/05/24 0909  BP:      Pulse:    (!) 108  Resp:    (!) 26  Temp: 98.4 F (36.9 C)     TempSrc: Axillary     SpO2:  96% 95% 96%  Weight:      Height:        Intake/Output Summary (Last 24 hours) at 04/05/2024 1123 Last data filed at 04/05/2024 0634 Gross per 24 hour  Intake 1228.37 ml  Output 1325 ml  Net -96.63 ml   Filed Weights   04/03/24 2300 04/04/24 0425 04/05/24 0424  Weight: 115.7 kg 115.9 kg 117.4 kg     Physical examination:   General:  AAO  x 3,  cooperative, no distress;   HEENT:  Normocephalic, PERRL, otherwise with in Normal limits   Neuro:  CNII-XII intact. , normal motor and sensation, reflexes intact   Lungs:   Clear to auscultation BL, Respirations unlabored,  No wheezes / crackles  Cardio:    S1/S2, RRR, No murmure, No Rubs or Gallops   Abdomen:  Soft, non-tender, bowel sounds active all four quadrants, no guarding or peritoneal signs.  Muscular  skeletal:  Limited exam -global generalized weaknesses - in bed, able to move all 4  extremities,   2+ pulses,  symmetric, No pitting edema  Skin:  Dry, warm to touch, negative for any Rashes,  Wounds: Please see nursing documentation  Pressure Injury 06/24/23 Coccyx Mid Stage 2 -  Partial thickness loss of dermis presenting as a shallow open injury with a red, pink wound bed without slough. Dark red area, tiny open area in center (Active)  06/24/23 1842  Location: Coccyx  Location Orientation: Mid  Staging: Stage 2 -  Partial thickness loss of dermis presenting as a shallow open injury with a red, pink wound bed without slough.  Wound Description (Comments): Dark red area, tiny open area in center  Present on Admission:           ------------------------------------------------------------------------------------------------------------------------------------------    LABs:     Latest Ref Rng & Units 04/05/2024    6:09 AM 04/04/2024    4:01 AM 04/03/2024   12:00 PM  CBC  WBC 4.0 - 10.5 K/uL 4.6  4.0  6.4   Hemoglobin 13.0 - 17.0 g/dL 46.2  70.3  50.0   Hematocrit 39.0 - 52.0 % 40.8  40.3  41.3   Platelets 150 - 400 K/uL 124  122  117       Latest Ref Rng & Units 04/05/2024    6:09 AM 04/04/2024    4:01 AM 04/03/2024   12:00 PM  CMP  Glucose 70 - 99 mg/dL 938  182  993   BUN 8 - 23 mg/dL 64  56  52   Creatinine 0.61 - 1.24 mg/dL 7.16  9.67  8.93   Sodium 135 - 145 mmol/L 139  138  134   Potassium 3.5 - 5.1 mmol/L 5.2  5.3  5.2   Chloride 98 - 111 mmol/L 102   101  94   CO2 22 - 32 mmol/L 30  27  29    Calcium  8.9 - 10.3 mg/dL 8.2  8.1  8.5   Total Protein 6.5 - 8.1 g/dL 6.4  6.3  6.8   Total Bilirubin 0.0 - 1.2 mg/dL 0.3  0.5  1.3   Alkaline Phos 38 - 126 U/L 73  66  75   AST 15 - 41 U/L 17  17  21    ALT 0 - 44 U/L 17  17  18            Radiology Reports DG CHEST PORT 1 VIEW Result Date: 04/05/2024 CLINICAL DATA:  Shortness of breath. EXAM: PORTABLE CHEST 1 VIEW COMPARISON:  04/03/2024. FINDINGS: Patient is rotated to the right. Low lung volumes. Stable cardiomediastinal silhouette. Pulmonary vascular congestion with similar bilateral interstitial prominence, which could reflect interstitial edema. No sizable pleural effusion. No focal consolidation. No pneumothorax. No acute osseous abnormality. IMPRESSION: Cardiomegaly with pulmonary vascular congestion and probable interstitial edema, not significantly changed since the prior exam. Electronically Signed   By: Mannie Seek M.D.   On: 04/05/2024 10:45    SIGNED: Bobbetta Burnet, MD, FHM. FAAFP. Arlin Benes - [facility] Critical care time spent - 55 min.  In seeing, evaluating and examining the patient. Reviewing medical records, labs, drawn plan of care. [facility],  Pager (please use [email or web address] to page/ text) Please use Epic Secure Chat for non-urgent communication (7AM-7PM)  If 7PM-7AM, please contact night-coverage [email or web address], 04/05/2024, 11:23 AM

## 2024-04-05 NOTE — Care Management Important Message (Signed)
 Important Message  Patient Details  Name: Ricky Johnston. MRN: [id number] Date of Birth: [birth year]   Important Message Given:  Yes - Medicare IM     Alicya Bena L Anmarie Fukushima 04/05/2024, 4:48 PM

## 2024-04-05 NOTE — Progress Notes (Signed)
 OT Cancellation Note  Patient Details Name: Ricky YOKLEY Sr. MRN: [id number] DOB: 09-27-1951   Cancelled Treatment:    Reason Eval/Treat Not Completed: Medical issues which prohibited therapy. Nurse reported the pt was not medically appropriate at this time. Will attempt evaluation latera as time permits and pt is more medically stable.   Sonja Manseau OT, MOT   Thurnell Floss 04/05/2024, 1:51 PM

## 2024-04-05 NOTE — Progress Notes (Signed)
 Pharmacy Antibiotic Note  Ricky Johnston. is a 73 y.o. male admitted on 04/03/2024 with sepsis and UTI.  Pharmacy has been consulted for vanco dosing.  Plan: Vancomycin 2000 mg IV x 1 dose Vancomycin 1500 mg IV every 48 hours. Ampicillin  2000 mg IV every 6 hours for UTI Monitor labs, c/s, and vanco level as indicated.  Height: 5\' 8"  (172.7 cm) Weight: 117.4 kg (258 lb 13.1 oz) IBW/kg (Calculated) : 68.4  Temp (24hrs), Avg:99 F (37.2 C), Min:98.4 F (36.9 C), Max:99.8 F (37.7 C)  Recent Labs  Lab 04/03/24 1200 04/03/24 1502 04/03/24 1745 04/04/24 0401 04/05/24 0609  WBC 6.4  --   --  4.0 4.6  CREATININE 2.87*  --   --  2.70* 2.66*  LATICACIDVEN 0.9 1.0 1.0  --   --     Estimated Creatinine Clearance: 31.2 mL/min (A) (by C-G formula based on SCr of 2.66 mg/dL (H)).    Allergies  Allergen Reactions   Empagliflozin  Other (See Comments)    Urinary tract infectious disease   Poison Ivy Extract [Poison Ivy Extract] Rash   Poison Oak Extract Rash    Antimicrobials this admission: Vanco 5/9 >> CTX 5/7 >>5/8 Amp 5/8 >>  5/9 Bcx: pending 5/7 BCX: gram + cocci 1 bottle BCID staph species- likely contaminant  5/7 Ucx: >80k enterococcus. F/U sensitivities    Thank you for allowing pharmacy to be a part of this patient's care.  Alicia Apa 04/05/2024 12:44 PM

## 2024-04-05 NOTE — Plan of Care (Signed)
  Problem: Respiratory: Goal: Ability to maintain adequate ventilation will improve Outcome: Progressing   Problem: Education: Goal: Ability to describe self-care measures that may prevent or decrease complications (Diabetes Survival Skills Education) will improve Outcome: Progressing   Problem: Fluid Volume: Goal: Ability to maintain a balanced intake and output will improve Outcome: Progressing

## 2024-04-06 DIAGNOSIS — R6520 Severe sepsis without septic shock: Secondary | ICD-10-CM | POA: Diagnosis not present

## 2024-04-06 DIAGNOSIS — J9600 Acute respiratory failure, unspecified whether with hypoxia or hypercapnia: Secondary | ICD-10-CM | POA: Diagnosis not present

## 2024-04-06 DIAGNOSIS — A419 Sepsis, unspecified organism: Secondary | ICD-10-CM | POA: Diagnosis not present

## 2024-04-06 LAB — COMPREHENSIVE METABOLIC PANEL WITH GFR
ALT: 13 U/L (ref 0–44)
AST: 13 U/L — ABNORMAL LOW (ref 15–41)
Albumin: 2.3 g/dL — ABNORMAL LOW (ref 3.5–5.0)
Alkaline Phosphatase: 65 U/L (ref 38–126)
Anion gap: 10 (ref 5–15)
BUN: 67 mg/dL — ABNORMAL HIGH (ref 8–23)
CO2: 26 mmol/L (ref 22–32)
Calcium: 8.2 mg/dL — ABNORMAL LOW (ref 8.9–10.3)
Chloride: 103 mmol/L (ref 98–111)
Creatinine, Ser: 2.82 mg/dL — ABNORMAL HIGH (ref 0.61–1.24)
GFR, Estimated: 23 mL/min — ABNORMAL LOW (ref >60)
Glucose, Bld: 238 mg/dL — ABNORMAL HIGH (ref 70–99)
Potassium: 4.9 mmol/L (ref 3.5–5.1)
Sodium: 139 mmol/L (ref 135–145)
Total Bilirubin: 0.4 mg/dL (ref 0.0–1.2)
Total Protein: 5.9 g/dL — ABNORMAL LOW (ref 6.5–8.1)

## 2024-04-06 LAB — CBC
HCT: 37.6 % — ABNORMAL LOW (ref 39.0–52.0)
Hemoglobin: 10.7 g/dL — ABNORMAL LOW (ref 13.0–17.0)
MCH: 28.1 pg (ref 26.0–34.0)
MCHC: 28.5 g/dL — ABNORMAL LOW (ref 30.0–36.0)
MCV: 98.7 fL (ref 80.0–100.0)
Platelets: 109 10*3/uL — ABNORMAL LOW (ref 150–400)
RBC: 3.81 MIL/uL — ABNORMAL LOW (ref 4.22–5.81)
RDW: 14 % (ref 11.5–15.5)
WBC: 2.9 10*3/uL — ABNORMAL LOW (ref 4.0–10.5)
nRBC: 0 % (ref 0.0–0.2)

## 2024-04-06 LAB — CULTURE, BLOOD (ROUTINE X 2)

## 2024-04-06 LAB — GLUCOSE, CAPILLARY
Glucose-Capillary: 232 mg/dL — ABNORMAL HIGH (ref 70–99)
Glucose-Capillary: 196 mg/dL — ABNORMAL HIGH (ref 70–99)
Glucose-Capillary: 184 mg/dL — ABNORMAL HIGH (ref 70–99)
Glucose-Capillary: 162 mg/dL — ABNORMAL HIGH (ref 70–99)
Glucose-Capillary: 148 mg/dL — ABNORMAL HIGH (ref 70–99)

## 2024-04-06 MED ORDER — GABAPENTIN 300 MG PO CAPS
300.0000 mg | ORAL_CAPSULE | Freq: Two times a day (BID) | ORAL | Status: DC
  Administered 2024-04-06 – 2024-04-08 (×4): 300 mg via ORAL
  Filled 2024-04-06 (×4): qty 1

## 2024-04-06 MED ORDER — SODIUM CHLORIDE 0.9 % IV SOLN
2.0000 g | Freq: Three times a day (TID) | INTRAVENOUS | Status: DC
  Administered 2024-04-06 – 2024-04-09 (×8): 2 g via INTRAVENOUS
  Filled 2024-04-06 (×4): qty 2000
  Filled 2024-04-06: qty 2
  Filled 2024-04-06 (×7): qty 2000

## 2024-04-06 NOTE — Progress Notes (Signed)
 Changed patient from medium mask to Large mask.

## 2024-04-06 NOTE — Progress Notes (Signed)
 Went to do patient's Bipap check and patient had been taken off so he could eat.  Patient has better mentation and conversing with staff.  Patient is wide awake and less lethargic.

## 2024-04-06 NOTE — Progress Notes (Signed)
 PROGRESS NOTE    Patient: Ricky BLAKELEY Sr.                            PCP: [facility]                    DOB: 25-Dec-1950            DOA: 04/03/2024 ZOX:[id number]             DOS: 04/06/2024, 10:24 AM   LOS: 3 days   Date of Service: The patient was seen and examined on 04/06/2024  Subjective:   The patient was seen and examined this morning, awake alert, following command, was compliant with BiPAP overnight, satting 100% currently on supplemental oxygen by nasal cannula Blood pressure stable mildly tachypneic with RR of 22, heart rate improved to 73, afebrile with temp of 98.6 today  Brief Narrative:   Ricky Poster Sr. Is a 73 year old male with extensive history of HTN, HLD, chronic systolic CHF, CAD, COPD, with chronically dependent 2L O2, OSA, on CPAP, DM2, GERD, anemia of chronic disease, morbid obesity, cardiac, bladder cancer under treatment now. Presenting with progressive generalized weakness for the past 3 days.  Uses walker at baseline, now wife uses Nurse, adult.  And confusion. Wife confirms that he is usually on 2 L of oxygen via nasal cannula, for CHF and COPD, now having more shortness of breath, lower extremity edema.  Poor p.o. intake in the past 3 days.   ED Evaluation/course: Blood pressure (!) 104/48, Pulse (!) 58, Temp. 98.4 F (36.9 C), RR (!) 29, height 5\' 8"  (1.727 m), weight 113.4 kg, SpO2 94%.  Labs: Sodium 134, potassium 5.2, chloride 94, glucose 213, BUN 52, creatinine 2.87, calcium  8.5, BNP 153, troponin 26, 23, WBC 6.4, UA: Cloudy, large hemoglobin, leukocyte Estrace, rare bacteria, WBC >50 Chest x-ray-cardiomegaly, vascular congestion, possible pulmonary edema  Patient met sepsis criteria due to UTI, requested to be admitted for IV antibiotics, IV fluids    Assessment & Plan:   Principal Problem:   Sepsis (HCC) Active Problems:   Urothelial carcinoma (HCC)   COPD (chronic obstructive pulmonary disease) (HCC)   Chronic systolic  heart failure (HCC)   HTN (hypertension)   CKD stage 3a, GFR 45-59 ml/min (HCC)   Morbid obesity with BMI of 40.0-44.9, adult (HCC)   Hyperlipidemia associated with type 2 diabetes mellitus (HCC)   Esophageal reflux   OSA (obstructive sleep apnea)   Impaired functional mobility, balance, gait, and endurance   Bladder cancer (HCC)   Renal insufficiency   A-fib (HCC)     Assessment and Plan: * Sepsis (HCC) -with toxic metabolic encephalopathy - Mentation improved, sepsis physiology improving - Neuro-and hemodynamically stable this a.m.  POA: Met sepsis criteria hypotension, tachycardia, tachypnea Source of infection UTI Afebrile with temp of 98.4, pulse 108, RR 28, BP 109/44, on BiPAP 40% FiO2, satting 96%  - Blood cultures are staph., ?  Contaminant, urine culture growing Enterococcus faecalis  -Switch IV Rocephin  >> Ampicillin , empiric coverage for gram-positive with vancomycin  -WBC 6.4, 4.0, 4.6 lactic acid 0.9>> 1.0 - Discontinue IVF  Hyperglycemic with history of DM2, - Episodes of hypoglycemia, much improved, CBG stabilized  - On concentrated Humulin R  TID, stable on reduced dose CBG (last 3)  Recent Labs    04/05/24 2116 04/06/24 0249 04/06/24 1011  GLUCAP 190* 232* 196*     Bladder cancer - Urothelial  carcinoma [facility]) -as needed  Bladder/urethral cath -Patient is to continue outpatient follow-up and treatment - Undertreatment last chemo 04/08/24  CT - stone IMPRESSION: *Right lower lobe atelectasis and right pleural reaction. *Cholelithiasis without gallbladder inflammatory changes. *Left kidney is absent. *Diffuse bladder wall thickening with a bubble of air in the bladder that could correlate with recent instrumentation. Findings could correlate with chronic bladder outlet obstruction. *Fecal impaction rectum without obstruction. *Aortic atherosclerosis.  Acute on chronic respiratory failure COPD (chronic obstructive pulmonary disease) (HCC)/ w OSA   - BiPAP nightly, back to O2 2 L at baseline, currently satting 100% -Was hypoxic yesterday ABG: Was reviewed, with hypercapnia, respiratory acidosis-which has improved  -Chest x-ray reporting congestion  -Continue O2 sat 92-88%, as needed DuoNeb,   Chronic systolic heart failure (HCC) Chronic systolic congestive heart failure with preserved ejection fraction -Last echo: 06/21/2023: 50 to 55%, LV function and size The right ventricular size is moderately enlarged. Right ventricular systolic  function is moderately reduced.  - Discontinuing IVF, chest x-ray reporting congestion, - Initiating diuretics    CKD stage 3a, GFR 45-59 ml/min (HCC) - Avoid nephrotoxins, hypotension -Baseline creatinine around 1.2-2.0  POA.  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 -Discussing regarding fluid management, weight loss, Close follow-up with PCP  A-fib (HCC) H/o Afib - per wife was started on Eliquis  at [facility]  Will continue   Esophageal reflux Continue PPI  Hyperlipidemia associated with type 2 diabetes mellitus (HCC) - Continue statins  HTN (hypertension) POA: Hypotensive due to sepsis, with Holding BP meds for now including Aldactone  and Lasix  for now   Impaired functional mobility, balance, gait, and endurance - Patient usually ambulates with walker, now requiring assist with all ADLs, including Hoyer lift -Consulted PT OT for evaluation, anticipating rehab, SNF on  discharge   -------------------------------------------------------------------------------------------------------------------------- Nutritional status:  The patient's BMI is: Body mass index is 39.35 kg/m. I agree with the assessment and plan as outlined  Skin Assessment: I have examined the patient's skin and I agree with the wound assessment as performed by wound care team As outlined belowe: Pressure Injury 06/24/23 Coccyx Mid Stage 2 -  Partial thickness loss of dermis presenting as a shallow open injury with a red, pink wound bed without slough. Dark red area, tiny open area in center (Active)  06/24/23 1842  Location: Coccyx  Location Orientation: Mid  Staging: Stage 2 -  Partial thickness loss of dermis presenting as a shallow open injury with a red, pink wound bed without slough.  Wound Description (Comments): Dark red area, tiny open area in center  Present on Admission:      ------------------------------------------------------------------------------------------------------------------ Cultures; Micro Results:  1/4 Blood cultures from 04/03/2024 -reporting staph coccusHomins  Urine culture -growing Enterococcus faecalis Repeating blood cultures 04/05/2024>>>  ------------------------------------------------------------------------------------------------------------------ DVT prophylaxis:  TED hose Start: 04/03/24 1440 SCDs Start: 04/03/24 1440 apixaban  (ELIQUIS ) tablet 5 mg   Code Status:   Code Status: Full Code  Family Communication: Wife present at bedside updated -Advance care planning has been  discussed.   Admission status:   Status is: Inpatient Remains inpatient appropriate because: Needing IV fluids, IV antibiotics, treatment for sepsis   Disposition: From  - home             Planning for discharge in 2 days: to SNF likely Monday, 04/08/2024   Procedures:   No admission procedures for hospital encounter.   Antimicrobials:  Anti-infectives (From  admission, onward)    Start     Dose/Rate Route Frequency Ordered Stop   04/07/24 1300  vancomycin (VANCOREADY) IVPB 1500 mg/300 mL        1,500 mg 150 mL/hr over 120 Minutes Intravenous Every 48 hours 04/05/24 1243     04/05/24 1330  vancomycin (VANCOREADY) IVPB 2000 mg/400 mL        2,000 mg 200 mL/hr over 120 Minutes Intravenous  Once 04/05/24 1241 04/05/24 1717   04/04/24 1400  ampicillin  (OMNIPEN) 2 g in sodium chloride  0.9 % 100 mL IVPB        2 g 300 mL/hr over 20 Minutes Intravenous Every 6 hours 04/04/24 1242     04/04/24 1000  cefTRIAXone  (ROCEPHIN ) 2 g in sodium chloride  0.9 % 100 mL IVPB  Status:  Discontinued        2 g 200 mL/hr over 30 Minutes Intravenous Every 24 hours 04/03/24 1444 04/04/24 1242   04/03/24 1230  cefTRIAXone  (ROCEPHIN ) 1 g in sodium chloride  0.9 % 100 mL IVPB        1 g 200 mL/hr over 30 Minutes Intravenous  Once 04/03/24 1218 04/03/24 1349        Medication:   apixaban   5 mg Oral BID   atorvastatin   80 mg Oral QHS   Chlorhexidine  Gluconate Cloth  6 each Topical Daily   ferrous sulfate   325 mg Oral QPM   fluticasone  furoate-vilanterol  1 puff Inhalation Daily   furosemide   20 mg Oral Daily   gabapentin   400 mg Oral BID   insulin  aspart  0-20 Units Subcutaneous TID WC   insulin  regular human CONCENTRATED  30 Units Subcutaneous Q breakfast   And   insulin  regular human CONCENTRATED  30 Units Subcutaneous Q lunch   And   insulin  regular human CONCENTRATED  30 Units Subcutaneous Q supper   ipratropium  0.5 mg Nebulization Q6H   ketoconazole   1 Application Topical Q M,W,F   levalbuterol   0.63 mg Nebulization Q6H   magnesium  citrate  1 Bottle Oral Once   senna-docusate  1 tablet Oral BID   sodium chloride  flush  3 mL Intravenous Q12H   tamsulosin   0.8 mg Oral QHS    acetaminophen  **OR** acetaminophen , hydrALAZINE , HYDROmorphone  (DILAUDID ) injection, ondansetron  **OR** ondansetron  (ZOFRAN ) IV, oxyCODONE , sodium phosphate,  traZODone    Objective:   Vitals:   04/06/24 0200 04/06/24 0300 04/06/24 0400 04/06/24 0500  BP: (!) 107/53 (!) 106/47 (!) 112/43 128/65  Pulse: (!) 47 71 71 73  Resp: (!) 7 19 (!) 23 (!) 22  Temp:    98.6 F (37 C)  TempSrc:    Oral  SpO2: 97% 97% 98% 100%  Weight:      Height:        Intake/Output Summary (Last 24 hours) at 04/06/2024 1024 Last data filed at 04/06/2024 0400 Gross per 24 hour  Intake 427.86 ml  Output 950 ml  Net -522.14 ml   Filed Weights   04/03/24 2300 04/04/24 0425 04/05/24 0424  Weight: 115.7 kg 115.9 kg 117.4 kg  Physical examination:        General:  AAO x 3,  cooperative, no distress;   HEENT:  Normocephalic, PERRL, otherwise with in Normal limits   Neuro:  CNII-XII intact. , normal motor and sensation, reflexes intact   Lungs:   Clear to auscultation BL, Respirations unlabored,  No wheezes / crackles  Cardio:    S1/S2, RRR, No murmure, No Rubs or Gallops   Abdomen:  Soft, non-tender, bowel sounds active all four quadrants, no guarding or peritoneal signs.  Muscular  skeletal:  Limited exam -global generalized weaknesses - in bed, able to move all 4 extremities,   2+ pulses,  symmetric, +1 pitting edema  Skin:  Dry, warm to touch, negative for any Rashes,  Wounds: Please see nursing documentation  Pressure Injury 06/24/23 Coccyx Mid Stage 2 -  Partial thickness loss of dermis presenting as a shallow open injury with a red, pink wound bed without slough. Dark red area, tiny open area in center (Active)  06/24/23 1842  Location: Coccyx  Location Orientation: Mid  Staging: Stage 2 -  Partial thickness loss of dermis presenting as a shallow open injury with a red, pink wound bed without slough.  Wound Description (Comments): Dark red area, tiny open area in center  Present on Admission:               ------------------------------------------------------------------------------------------------------------------------------------------    LABs:     Latest Ref Rng & Units 04/06/2024    5:40 AM 04/05/2024    6:09 AM 04/04/2024    4:01 AM  CBC  WBC 4.0 - 10.5 K/uL 2.9  4.6  4.0   Hemoglobin 13.0 - 17.0 g/dL 30.8  65.7  84.6   Hematocrit 39.0 - 52.0 % 37.6  40.8  40.3   Platelets 150 - 400 K/uL 109  124  122       Latest Ref Rng & Units 04/06/2024    5:40 AM 04/05/2024    6:09 AM 04/04/2024    4:01 AM  CMP  Glucose 70 - 99 mg/dL 962  952  841   BUN 8 - 23 mg/dL 67  64  56   Creatinine 0.61 - 1.24 mg/dL 3.24  4.01  0.27   Sodium 135 - 145 mmol/L 139  139  138   Potassium 3.5 - 5.1 mmol/L 4.9  5.2  5.3   Chloride 98 - 111 mmol/L 103  102  101   CO2 22 - 32 mmol/L 26  30  27    Calcium  8.9 - 10.3 mg/dL 8.2  8.2  8.1   Total Protein 6.5 - 8.1 g/dL 5.9  6.4  6.3   Total Bilirubin 0.0 - 1.2 mg/dL 0.4  0.3  0.5   Alkaline Phos 38 - 126 U/L 65  73  66   AST 15 - 41 U/L 13  17  17    ALT 0 - 44 U/L 13  17  17            Radiology Reports No results found.   SIGNED: Bobbetta Burnet, MD, FHM. FAAFP. Arlin Benes - [facility] Critical care time spent - 55 min.  In seeing, evaluating and examining the patient. Reviewing medical records, labs, drawn plan of care. [facility],  Pager (please use [email or web address] to page/ text) Please use Epic Secure Chat for non-urgent communication (7AM-7PM)  If 7PM-7AM, please contact night-coverage [email or web address], 04/06/2024, 10:24 AM

## 2024-04-07 DIAGNOSIS — R6520 Severe sepsis without septic shock: Secondary | ICD-10-CM | POA: Diagnosis not present

## 2024-04-07 DIAGNOSIS — J9600 Acute respiratory failure, unspecified whether with hypoxia or hypercapnia: Secondary | ICD-10-CM | POA: Diagnosis not present

## 2024-04-07 DIAGNOSIS — A419 Sepsis, unspecified organism: Secondary | ICD-10-CM | POA: Diagnosis not present

## 2024-04-07 LAB — CBC
HCT: 36.2 % — ABNORMAL LOW (ref 39.0–52.0)
Hemoglobin: 10.7 g/dL — ABNORMAL LOW (ref 13.0–17.0)
MCH: 28.9 pg (ref 26.0–34.0)
MCHC: 29.6 g/dL — ABNORMAL LOW (ref 30.0–36.0)
MCV: 97.8 fL (ref 80.0–100.0)
Platelets: 128 10*3/uL — ABNORMAL LOW (ref 150–400)
RBC: 3.7 MIL/uL — ABNORMAL LOW (ref 4.22–5.81)
RDW: 14.1 % (ref 11.5–15.5)
WBC: 3.9 10*3/uL — ABNORMAL LOW (ref 4.0–10.5)
nRBC: 0 % (ref 0.0–0.2)

## 2024-04-07 LAB — GLUCOSE, CAPILLARY
Glucose-Capillary: 158 mg/dL — ABNORMAL HIGH (ref 70–99)
Glucose-Capillary: 164 mg/dL — ABNORMAL HIGH (ref 70–99)
Glucose-Capillary: 202 mg/dL — ABNORMAL HIGH (ref 70–99)
Glucose-Capillary: 60 mg/dL — ABNORMAL LOW (ref 70–99)
Glucose-Capillary: 73 mg/dL (ref 70–99)

## 2024-04-07 LAB — BRAIN NATRIURETIC PEPTIDE: B Natriuretic Peptide: 126 pg/mL — ABNORMAL HIGH (ref 0.0–100.0)

## 2024-04-07 MED ORDER — VANCOMYCIN HCL 1500 MG/300ML IV SOLN
1500.0000 mg | INTRAVENOUS | Status: AC
  Administered 2024-04-07: 1500 mg via INTRAVENOUS
  Filled 2024-04-07: qty 300

## 2024-04-07 NOTE — Progress Notes (Signed)
 PROGRESS NOTE    Patient: Ricky BRUZEK Sr.                            PCP: [facility]                    DOB: Jun 11, 1951            DOA: 04/03/2024 JXB:[id number]             DOS: 04/07/2024, 11:43 AM   LOS: 4 days   Date of Service: The patient was seen and examined on 04/07/2024  Subjective:   The patient was seen and examined this morning, awake alert oriented.  In no acute distress Compliant on BiPAP overnight--now on O2 via nasal cannula satting 98% mildly hypotensive BP 96/44   Brief Narrative:   Ricky Poster Sr. Is a 73 year old male with extensive history of HTN, HLD, chronic systolic CHF, CAD, COPD, with chronically dependent 2L O2, OSA, on CPAP, DM2, GERD, anemia of chronic disease, morbid obesity, cardiac, bladder cancer under treatment now. Presenting with progressive generalized weakness for the past 3 days.  Uses walker at baseline, now wife uses Nurse, adult.  And confusion. Wife confirms that he is usually on 2 L of oxygen via nasal cannula, for CHF and COPD, now having more shortness of breath, lower extremity edema.  Poor p.o. intake in the past 3 days.   ED Evaluation/course: Blood pressure (!) 104/48, Pulse (!) 58, Temp. 98.4 F (36.9 C), RR (!) 29, height 5\' 8"  (1.727 m), weight 113.4 kg, SpO2 94%.  Labs: Sodium 134, potassium 5.2, chloride 94, glucose 213, BUN 52, creatinine 2.87, calcium  8.5, BNP 153, troponin 26, 23, WBC 6.4, UA: Cloudy, large hemoglobin, leukocyte Estrace, rare bacteria, WBC >50 Chest x-ray-cardiomegaly, vascular congestion, possible pulmonary edema  Patient met sepsis criteria due to UTI, requested to be admitted for IV antibiotics, IV fluids    Assessment & Plan:   Principal Problem:   Sepsis (HCC) Active Problems:   Urothelial carcinoma (HCC)   COPD (chronic obstructive pulmonary disease) (HCC)   Chronic systolic heart failure (HCC)   HTN (hypertension)   CKD stage 3a, GFR 45-59 ml/min (HCC)   Morbid obesity with  BMI of 40.0-44.9, adult (HCC)   Hyperlipidemia associated with type 2 diabetes mellitus (HCC)   Esophageal reflux   OSA (obstructive sleep apnea)   Impaired functional mobility, balance, gait, and endurance   Bladder cancer (HCC)   Renal insufficiency   A-fib (HCC)     Assessment and Plan: * Sepsis (HCC) -with toxic metabolic encephalopathy - Mentation improved back to baseline - Neuro-and hemodynamically stable this a.m.  POA: Met sepsis criteria hypotension, tachycardia, tachypnea Source of infection UTI Afebrile with temp of 98.4, pulse 108, RR 28, BP 109/44, on BiPAP 40% FiO2, satting 96%  - Blood cultures are staph., ?  Contaminant, urine culture growing Enterococcus faecalis  -Switch IV Rocephin  >> Ampicillin , empiric coverage for gram-positive with vancomycin - Discontinue vancomycin  -WBC 6.4, 4.0, 4.6 lactic acid 0.9>> 1.0 - Discontinue IVF  Hyperglycemic with history of DM2, - Episodes of hypoglycemia, monitoring CBG, stable now   - On concentrated Humulin R  TID, stable on reduced dose CBG (last 3)  Recent Labs    04/06/24 2152 04/07/24 0830 04/07/24 1140  GLUCAP 148* 158* 164*     Bladder cancer - Urothelial carcinoma (HCC) -as needed  Bladder/urethral cath -Patient is to  continue outpatient follow-up and treatment - Undertreatment last chemo 04/08/24  CT - stone IMPRESSION: *Right lower lobe atelectasis and right pleural reaction. *Cholelithiasis without gallbladder inflammatory changes. *Left kidney is absent. *Diffuse bladder wall thickening with a bubble of air in the bladder that could correlate with recent instrumentation. Findings could correlate with chronic bladder outlet obstruction. *Fecal impaction rectum without obstruction. *Aortic atherosclerosis.  Acute on chronic respiratory failure COPD (chronic obstructive pulmonary disease) (HCC)/ w OSA  - BiPAP nightly, back to O2 2 L at baseline, -currently on 4 L of oxygen, satting 98% -Was  hypoxic 04/05/24 ABG: Was reviewed, with hypercapnia, respiratory acidosis-which has improved  -Chest x-ray reporting congestion  -Continue O2 sat 92-88%, as needed DuoNeb,   Chronic systolic heart failure (HCC) Chronic systolic congestive heart failure with preserved ejection fraction -Last echo: 06/21/2023: 50 to 55%, LV function and size The right ventricular size is moderately enlarged. Right ventricular systolic  function is moderately reduced.  - Discontinuing IVF, chest x-ray reporting congestion, - Initiating diuretics  CKD stage 3a, GFR 45-59 ml/min (HCC) - Avoid nephrotoxins, hypotension -Baseline creatinine around 1.2-2.0  POA.  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 -Discussing regarding fluid management, weight loss, Close follow-up with PCP  A-fib (HCC)- H/o Afib - per wife was started on Eliquis  at [facility]    Esophageal reflux - Continue PPI  Hyperlipidemia - Continue statins  HTN (hypertension) POA: Hypotensive due to sepsis,  - Holding BP meds for now including Aldactone   - Resuming Lasix  for now   Impaired functional mobility, balance, gait, and endurance - Patient usually ambulates with walker, now requiring assist with all ADLs, including Hoyer lift -Consulted PT OT for evaluation, anticipating rehab, SNF on  discharge   -------------------------------------------------------------------------------------------------------------------------- Nutritional status:  The patient's BMI is: Body mass index is 38.82 kg/m. I agree with the assessment and plan as outlined  Skin Assessment: I have examined the patient's skin and I agree with the wound assessment as performed by wound care team As outlined belowe: Pressure Injury 06/24/23 Coccyx Mid Stage 2 -  Partial thickness loss of dermis presenting as a shallow open injury with a red, pink wound bed without slough. Dark red area, tiny open area in center (Active)  06/24/23 1842  Location: Coccyx  Location Orientation: Mid  Staging: Stage 2 -  Partial thickness loss of dermis presenting as a shallow open injury with a red, pink wound bed without slough.  Wound Description (Comments): Dark red area, tiny open area in center  Present on Admission:   Dressing Type Foam - Lift dressing to assess site every shift 04/07/24 0900     ------------------------------------------------------------------------------------------------------------------ Cultures; Micro Results:  1/4 Blood cultures from 04/03/2024 -reporting staph coccusHomins  Urine culture -growing Enterococcus faecalis Repeating blood cultures 04/05/2024>>>  ------------------------------------------------------------------------------------------------------------------ DVT prophylaxis:  TED hose Start: 04/03/24 1440 SCDs Start: 04/03/24 1440 apixaban  (ELIQUIS ) tablet 5 mg   Code Status:   Code Status: Full Code  Family Communication: Wife present at bedside updated -Advance care planning has  been discussed.   Admission status:   Status is: Inpatient Remains inpatient appropriate because: Needing IV fluids, IV antibiotics, treatment for sepsis   Disposition: From  - home             Planning for discharge in 2 days: to SNF likely Monday, 04/08/2024   Procedures:   No admission  procedures for hospital encounter.   Antimicrobials:  Anti-infectives (From admission, onward)    Start     Dose/Rate Route Frequency Ordered Stop   04/07/24 1300  vancomycin (VANCOREADY) IVPB 1500 mg/300 mL  Status:  Discontinued        1,500 mg 150 mL/hr over 120 Minutes Intravenous Every 48 hours 04/05/24 1243 04/07/24 0730   04/07/24 1300  vancomycin (VANCOREADY) IVPB 1500 mg/300 mL        1,500 mg 150 mL/hr over 120 Minutes Intravenous Every 48 hours 04/07/24 0730 04/09/24 1259   04/06/24 2200  ampicillin  (OMNIPEN) 2 g in sodium chloride  0.9 % 100 mL IVPB        2 g 300 mL/hr over 20 Minutes Intravenous Every 8 hours 04/06/24 1445     04/05/24 1330  vancomycin (VANCOREADY) IVPB 2000 mg/400 mL        2,000 mg 200 mL/hr over 120 Minutes Intravenous  Once 04/05/24 1241 04/05/24 1717   04/04/24 1400  ampicillin  (OMNIPEN) 2 g in sodium chloride  0.9 % 100 mL IVPB  Status:  Discontinued        2 g 300 mL/hr over 20 Minutes Intravenous Every 6 hours 04/04/24 1242 04/06/24 1445   04/04/24 1000  cefTRIAXone  (ROCEPHIN ) 2 g in sodium chloride  0.9 % 100 mL IVPB  Status:  Discontinued        2 g 200 mL/hr over 30 Minutes Intravenous Every 24 hours 04/03/24 1444 04/04/24 1242   04/03/24 1230  cefTRIAXone  (ROCEPHIN ) 1 g in sodium chloride  0.9 % 100 mL IVPB        1 g 200 mL/hr over 30 Minutes Intravenous  Once 04/03/24 1218 04/03/24 1349        Medication:   apixaban   5 mg Oral BID   atorvastatin   80 mg Oral QHS   Chlorhexidine  Gluconate Cloth  6 each Topical Daily   ferrous sulfate   325 mg Oral QPM   fluticasone  furoate-vilanterol  1 puff Inhalation Daily   furosemide   20 mg Oral Daily   gabapentin   300 mg Oral BID   insulin  aspart  0-20 Units Subcutaneous TID WC   insulin  regular human CONCENTRATED  30 Units Subcutaneous Q breakfast   And   insulin  regular human CONCENTRATED  30 Units Subcutaneous Q lunch   And   insulin  regular human CONCENTRATED  30 Units Subcutaneous Q supper    ipratropium  0.5 mg Nebulization Q6H   ketoconazole   1 Application Topical Q M,W,F   levalbuterol   0.63 mg Nebulization Q6H   senna-docusate  1 tablet Oral BID   sodium chloride  flush  3 mL Intravenous Q12H   tamsulosin   0.8 mg Oral QHS    acetaminophen  **OR** acetaminophen , HYDROmorphone  (DILAUDID ) injection, ondansetron  **OR** ondansetron  (ZOFRAN ) IV, oxyCODONE , sodium phosphate, traZODone    Objective:   Vitals:   04/07/24 0900 04/07/24 0914 04/07/24 1000 04/07/24 1141  BP: 102/61  (!) 96/44   Pulse: 72  85   Resp: (!) 21  17   Temp:    98.5 F (36.9 C)  TempSrc:    Oral  SpO2: 96% 96% 98%   Weight:  Height:        Intake/Output Summary (Last 24 hours) at 04/07/2024 1143 Last data filed at 04/07/2024 0902 Gross per 24 hour  Intake 703 ml  Output 2700 ml  Net -1997 ml   Filed Weights   04/04/24 0425 04/05/24 0424 04/07/24 0326  Weight: 115.9 kg 117.4 kg 115.8 kg     Physical examination:   General:  AAO x 3,  cooperative, no distress;   HEENT:  Normocephalic, PERRL, otherwise with in Normal limits   Neuro:  CNII-XII intact. , normal motor and sensation, reflexes intact   Lungs:   Clear to auscultation BL, Respirations unlabored,  No wheezes / crackles  Cardio:    S1/S2, RRR, No murmure, No Rubs or Gallops   Abdomen:  Soft, non-tender, bowel sounds active all four quadrants, no guarding or peritoneal signs.  Muscular  skeletal:  Limited exam -global generalized weaknesses - in bed, able to move all 4 extremities,   2+ pulses,  symmetric, No pitting edema  Skin:  Dry, warm to touch, negative for any Rashes,  Wounds: Please see nursing documentation  Pressure Injury 06/24/23 Coccyx Mid Stage 2 -  Partial thickness loss of dermis presenting as a shallow open injury with a red, pink wound bed without slough. Dark red area, tiny open area in center (Active)  06/24/23 1842  Location: Coccyx  Location Orientation: Mid  Staging: Stage 2 -  Partial thickness  loss of dermis presenting as a shallow open injury with a red, pink wound bed without slough.  Wound Description (Comments): Dark red area, tiny open area in center  Present on Admission:   Dressing Type Foam - Lift dressing to assess site every shift 04/07/24 0900                ------------------------------------------------------------------------------------------------------------------------------------------    LABs:     Latest Ref Rng & Units 04/07/2024    6:26 AM 04/06/2024    5:40 AM 04/05/2024    6:09 AM  CBC  WBC 4.0 - 10.5 K/uL 3.9  2.9  4.6   Hemoglobin 13.0 - 17.0 g/dL 91.4  78.2  95.6   Hematocrit 39.0 - 52.0 % 36.2  37.6  40.8   Platelets 150 - 400 K/uL 128  109  124       Latest Ref Rng & Units 04/06/2024    5:40 AM 04/05/2024    6:09 AM 04/04/2024    4:01 AM  CMP  Glucose 70 - 99 mg/dL 213  086  578   BUN 8 - 23 mg/dL 67  64  56   Creatinine 0.61 - 1.24 mg/dL 4.69  6.29  5.28   Sodium 135 - 145 mmol/L 139  139  138   Potassium 3.5 - 5.1 mmol/L 4.9  5.2  5.3   Chloride 98 - 111 mmol/L 103  102  101   CO2 22 - 32 mmol/L 26  30  27    Calcium  8.9 - 10.3 mg/dL 8.2  8.2  8.1   Total Protein 6.5 - 8.1 g/dL 5.9  6.4  6.3   Total Bilirubin 0.0 - 1.2 mg/dL 0.4  0.3  0.5   Alkaline Phos 38 - 126 U/L 65  73  66   AST 15 - 41 U/L 13  17  17    ALT 0 - 44 U/L 13  17  17            Radiology Reports No results found.   SIGNED: Constantino Demark  Ryelan Kazee, MD, FHM. FAAFP. Arlin Benes - [facility] Critical care time spent - 55 min.  In seeing, evaluating and examining the patient. Reviewing medical records, labs, drawn plan of care. [facility],  Pager (please use [email or web address] to page/ text) Please use Epic Secure Chat for non-urgent communication (7AM-7PM)  If 7PM-7AM, please contact night-coverage [email or web address], 04/07/2024, 11:43 AM

## 2024-04-07 NOTE — Plan of Care (Signed)
  Problem: Fluid Volume: Goal: Hemodynamic stability will improve Outcome: Progressing   Problem: Clinical Measurements: Goal: Diagnostic test results will improve Outcome: Progressing Goal: Signs and symptoms of infection will decrease Outcome: Progressing   Problem: Respiratory: Goal: Ability to maintain adequate ventilation will improve Outcome: Progressing   Problem: Education: Goal: Ability to describe self-care measures that may prevent or decrease complications (Diabetes Survival Skills Education) will improve Outcome: Progressing Goal: Individualized Educational Video(s) Outcome: Progressing   Problem: Fluid Volume: Goal: Ability to maintain a balanced intake and output will improve Outcome: Progressing   Problem: Health Behavior/Discharge Planning: Goal: Ability to identify and utilize available resources and services will improve Outcome: Progressing Goal: Ability to manage health-related needs will improve Outcome: Progressing   Problem: Metabolic: Goal: Ability to maintain appropriate glucose levels will improve Outcome: Progressing   Problem: Nutritional: Goal: Maintenance of adequate nutrition will improve Outcome: Not Progressing Goal: Progress toward achieving an optimal weight will improve Outcome: Not Progressing   Problem: Skin Integrity: Goal: Risk for impaired skin integrity will decrease Outcome: Progressing   Problem: Tissue Perfusion: Goal: Adequacy of tissue perfusion will improve Outcome: Progressing   Problem: Education: Goal: Knowledge of General Education information will improve Description: Including pain rating scale, medication(s)/side effects and non-pharmacologic comfort measures Outcome: Progressing   Problem: Health Behavior/Discharge Planning: Goal: Ability to manage health-related needs will improve Outcome: Not Progressing   Problem: Clinical Measurements: Goal: Ability to maintain clinical measurements within normal  limits will improve Outcome: Progressing Goal: Will remain free from infection Outcome: Progressing Goal: Diagnostic test results will improve Outcome: Progressing Goal: Respiratory complications will improve Outcome: Progressing Goal: Cardiovascular complication will be avoided Outcome: Progressing   Problem: Activity: Goal: Risk for activity intolerance will decrease Outcome: Not Progressing   Problem: Nutrition: Goal: Adequate nutrition will be maintained Outcome: Not Progressing   Problem: Coping: Goal: Level of anxiety will decrease Outcome: Progressing   Problem: Elimination: Goal: Will not experience complications related to bowel motility Outcome: Progressing Goal: Will not experience complications related to urinary retention Outcome: Progressing   Problem: Pain Managment: Goal: General experience of comfort will improve and/or be controlled Outcome: Progressing   Problem: Safety: Goal: Ability to remain free from injury will improve Outcome: Progressing   Problem: Skin Integrity: Goal: Risk for impaired skin integrity will decrease Outcome: Progressing

## 2024-04-07 NOTE — Plan of Care (Signed)
  Problem: Fluid Volume: Goal: Hemodynamic stability will improve Outcome: Progressing   Problem: Respiratory: Goal: Ability to maintain adequate ventilation will improve Outcome: Progressing   Problem: Fluid Volume: Goal: Ability to maintain a balanced intake and output will improve Outcome: Progressing   Problem: Metabolic: Goal: Ability to maintain appropriate glucose levels will improve Outcome: Progressing

## 2024-04-08 DIAGNOSIS — R6520 Severe sepsis without septic shock: Secondary | ICD-10-CM | POA: Diagnosis not present

## 2024-04-08 DIAGNOSIS — A419 Sepsis, unspecified organism: Secondary | ICD-10-CM | POA: Diagnosis not present

## 2024-04-08 DIAGNOSIS — J9600 Acute respiratory failure, unspecified whether with hypoxia or hypercapnia: Secondary | ICD-10-CM | POA: Diagnosis not present

## 2024-04-08 LAB — BASIC METABOLIC PANEL WITH GFR
Anion gap: 10 (ref 5–15)
BUN: 72 mg/dL — ABNORMAL HIGH (ref 8–23)
CO2: 24 mmol/L (ref 22–32)
Calcium: 8.7 mg/dL — ABNORMAL LOW (ref 8.9–10.3)
Chloride: 106 mmol/L (ref 98–111)
Creatinine, Ser: 3.26 mg/dL — ABNORMAL HIGH (ref 0.61–1.24)
GFR, Estimated: 19 mL/min — ABNORMAL LOW (ref >60)
Glucose, Bld: 101 mg/dL — ABNORMAL HIGH (ref 70–99)
Potassium: 5.2 mmol/L — ABNORMAL HIGH (ref 3.5–5.1)
Sodium: 140 mmol/L (ref 135–145)

## 2024-04-08 LAB — GLUCOSE, CAPILLARY
Glucose-Capillary: 110 mg/dL — ABNORMAL HIGH (ref 70–99)
Glucose-Capillary: 119 mg/dL — ABNORMAL HIGH (ref 70–99)
Glucose-Capillary: 156 mg/dL — ABNORMAL HIGH (ref 70–99)
Glucose-Capillary: 174 mg/dL — ABNORMAL HIGH (ref 70–99)
Glucose-Capillary: 81 mg/dL (ref 70–99)

## 2024-04-08 LAB — CBC
HCT: 35.5 % — ABNORMAL LOW (ref 39.0–52.0)
Hemoglobin: 10.2 g/dL — ABNORMAL LOW (ref 13.0–17.0)
MCH: 27.6 pg (ref 26.0–34.0)
MCHC: 28.7 g/dL — ABNORMAL LOW (ref 30.0–36.0)
MCV: 96.2 fL (ref 80.0–100.0)
Platelets: 137 10*3/uL — ABNORMAL LOW (ref 150–400)
RBC: 3.69 MIL/uL — ABNORMAL LOW (ref 4.22–5.81)
RDW: 13.9 % (ref 11.5–15.5)
WBC: 4.2 10*3/uL (ref 4.0–10.5)
nRBC: 0 % (ref 0.0–0.2)

## 2024-04-08 LAB — CULTURE, BLOOD (ROUTINE X 2): Culture: NO GROWTH

## 2024-04-08 MED ORDER — FUROSEMIDE 20 MG PO TABS
20.0000 mg | ORAL_TABLET | Freq: Every day | ORAL | Status: DC
  Administered 2024-04-09: 20 mg via ORAL
  Filled 2024-04-08: qty 1

## 2024-04-08 MED ORDER — GABAPENTIN 100 MG PO CAPS
200.0000 mg | ORAL_CAPSULE | Freq: Two times a day (BID) | ORAL | Status: DC
  Administered 2024-04-08 – 2024-04-09 (×2): 200 mg via ORAL
  Filled 2024-04-08 (×2): qty 2

## 2024-04-08 MED ORDER — MAGNESIUM CITRATE PO SOLN
1.0000 | Freq: Once | ORAL | Status: AC
  Administered 2024-04-08: 1 via ORAL
  Filled 2024-04-08: qty 296

## 2024-04-08 MED ORDER — SODIUM CHLORIDE 0.9 % IV SOLN
INTRAVENOUS | Status: AC
Start: 2024-04-08 — End: 2024-04-08

## 2024-04-08 NOTE — Inpatient Diabetes Management (Signed)
 Inpatient Diabetes Program Recommendations  AACE/ADA: New Consensus Statement on Inpatient Glycemic Control (2015)  Target Ranges:  Prepandial:   less than 140 mg/dL      Peak postprandial:   less than 180 mg/dL (1-2 hours)      Critically ill patients:  140 - 180 mg/dL   Lab Results  Component Value Date   GLUCAP 110 (H) 04/08/2024   HGBA1C 7.8 (H) 04/03/2024    Latest Reference Range & Units 04/07/24 08:30 04/07/24 11:40 04/07/24 16:16 04/07/24 22:13 04/07/24 23:47 04/08/24 00:37 04/08/24 07:16  Glucose-Capillary 70 - 99 mg/dL 161 (H) Novolog  4 U 500 30 164 (H) Novolog  4 U 500 30 202 (H) Novolog  7 U 500 30 73 60 (L) 119 (H) 110 (H) U 500 30 units  (H): Data is abnormally high (L): Data is abnormally low  Diabetes history: DM2 Outpatient Diabetes medications: Humulin R  U500 60 units with breakfast, 75 units with lunch, 30 units with supper; Mounjaro 12.5 mg Qweek (Sunday) Current orders for Inpatient glycemic control: Humulin R  U500 100 units with breakfast, 100 units with lunch, 50 units with supper, Novolog  0-20 units TID with meals  Inpatient Diabetes Program Recommendations:   Noted hypoglycemia post Novolog  correction + U 500 dose. Please consider: -Decrease Novolog  correction to 0-9 units tid, 0-5 units hs  Thank you, Jasim Harari E. Alroy Portela, RN, MSN, CDCES  Diabetes Coordinator Inpatient Glycemic Control Team Team Pager [phone] (8am-5pm) 04/08/2024 9:11 AM

## 2024-04-08 NOTE — Progress Notes (Signed)
 PROGRESS NOTE    Patient: Ricky KASPAREK Sr.                            PCP: [facility]                    DOB: 07-02-1951            DOA: 04/03/2024 ZOX:[id number]             DOS: 04/08/2024, 10:57 AM   LOS: 5 days   Date of Service: The patient was seen and examined on 04/08/2024  Subjective:   The patient was seen and examined this morning, stable sitting up in chair no acute distress Compliant with BiPAP overnight. Currently satting 93% on 3 L of oxygen    Brief Narrative:   Ricky Poster Sr. Is a 73 year old male with extensive history of HTN, HLD, chronic systolic CHF, CAD, COPD, with chronically dependent 2L O2, OSA, on CPAP, DM2, GERD, anemia of chronic disease, morbid obesity, cardiac, bladder cancer under treatment now. Presenting with progressive generalized weakness for the past 3 days.  Uses walker at baseline, now wife uses Nurse, adult.  And confusion. Wife confirms that he is usually on 2 L of oxygen via nasal cannula, for CHF and COPD, now having more shortness of breath, lower extremity edema.  Poor p.o. intake in the past 3 days.   ED Evaluation/course: Blood pressure (!) 104/48, Pulse (!) 58, Temp. 98.4 F (36.9 C), RR (!) 29, height 5\' 8"  (1.727 m), weight 113.4 kg, SpO2 94%.  Labs: Sodium 134, potassium 5.2, chloride 94, glucose 213, BUN 52, creatinine 2.87, calcium  8.5, BNP 153, troponin 26, 23, WBC 6.4, UA: Cloudy, large hemoglobin, leukocyte Estrace, rare bacteria, WBC >50 Chest x-ray-cardiomegaly, vascular congestion, possible pulmonary edema  Patient met sepsis criteria due to UTI, requested to be admitted for IV antibiotics, IV fluids    Assessment & Plan:   Principal Problem:   Sepsis (HCC) Active Problems:   Urothelial carcinoma (HCC)   COPD (chronic obstructive pulmonary disease) (HCC)   Chronic systolic heart failure (HCC)   HTN (hypertension)   CKD stage 3a, GFR 45-59 ml/min (HCC)   Morbid obesity with BMI of 40.0-44.9,  adult (HCC)   Hyperlipidemia associated with type 2 diabetes mellitus (HCC)   Esophageal reflux   OSA (obstructive sleep apnea)   Impaired functional mobility, balance, gait, and endurance   Bladder cancer (HCC)   Renal insufficiency   A-fib (HCC)     Assessment and Plan: * Sepsis (HCC) -with toxic metabolic encephalopathy -Improved sepsis physiology, improved mentation - Neuro-and hemodynamically stable this a.m.  POA: Met sepsis criteria hypotension, tachycardia, tachypnea Source of infection UTI Temp of 98.4, pulse 108, RR 28, BP 109/44, on BiPAP 40% FiO2, satting 96%  - Blood cultures are staph., ?  Contaminant, urine culture growing Enterococcus faecalis  -Switch IV Rocephin  >> Ampicillin , empiric coverage for gram-positive with vancomycin - Discontinue vancomycin  -WBC 6.4, 4.0, 4.6, 4.2  - Lactic acid 0.9>> 1.0 - Discontinue IVF  Hyperglycemic with history of DM2, - Episodes of hypoglycemia, monitoring CBG, stable now   - On concentrated Humulin R  TID, stable on reduced dose CBG (last 3)  Recent Labs    04/07/24 2347 04/08/24 0037 04/08/24 0716  GLUCAP 60* 119* 110*     Bladder cancer - Urothelial carcinoma (HCC) -as needed  Bladder/urethral cath -Patient is to continue outpatient  follow-up and treatment - Undertreatment last chemo 04/08/24  CT - stone IMPRESSION: *Right lower lobe atelectasis and right pleural reaction. *Cholelithiasis without gallbladder inflammatory changes. *Left kidney is absent. *Diffuse bladder wall thickening with a bubble of air in the bladder that could correlate with recent instrumentation. Findings could correlate with chronic bladder outlet obstruction. *Fecal impaction rectum without obstruction. *Aortic atherosclerosis.  Acute on chronic respiratory failure COPD (chronic obstructive pulmonary disease) (HCC)/ w OSA  - BiPAP nightly, back to O2 2 L at baseline,  - Down to baseline 3 L of oxygen, satting 96%  -Was  hypoxic 04/05/24 ABG: Was reviewed, with hypercapnia, respiratory acidosis-which has improved  -Chest x-ray reporting congestion -Continue O2 sat 92-88%, as needed DuoNeb,   Chronic systolic heart failure (HCC) Chronic systolic congestive heart failure with preserved ejection fraction -Last echo: 06/21/2023: 50 to 55%, LV function and size The right ventricular size is moderately enlarged. Right ventricular systolic  function is moderately reduced.  - Discontinuing IVF, chest x-ray reporting congestion, - Due to elevated creatinine holding diuretics Lasix   CKD stage 3a, GFR 45-59 ml/min (HCC) - Avoid nephrotoxins, hypotension -Baseline creatinine around 1.2-2.0  POA.  BUN 52, creatinine 2.87 (1.51 on 06/25/2023), GFR 23 - UA positive for glucose, protein 100, -Will monitor closely  Renal insufficiency (h/o nephrectomy due to cancer, 1 functional kidney) BUN/creatinine elevated from baseline Lab Results  Component Value Date   CREATININE 3.26 (H) 04/08/2024   CREATININE 2.82 (H) 04/06/2024   CREATININE 2.66 (H) 04/05/2024    -Adjusting medication to renal dose    Morbid obesity with BMI of 40.0-44.9, adult (HCC) Body mass index is 38.01 kg/m. -Discussing regarding fluid management, weight loss, Close follow-up with PCP  A-fib (HCC)- H/o Afib - per wife was started on Eliquis  at [facility]    Esophageal reflux - Continue PPI  Hyperlipidemia - Continue statins  HTN (hypertension) -POA hypotensive exacerbated by sepsis - Holding BP meds for now including Aldactone   - Holding Lasix  today   Impaired functional mobility, balance, gait, and endurance - Patient usually ambulates with walker, now requiring assist with all ADLs, including Hoyer lift -Consulted PT OT for evaluation, anticipating rehab, SNF on discharge   -------------------------------------------------------------------------------------------------------------------------- Nutritional status:  The patient's BMI is:  Body mass index is 39.55 kg/m. I agree with the assessment and plan as outlined  Skin Assessment: I have examined the patient's skin and I agree with the wound assessment as performed by wound care team As outlined belowe: Pressure Injury 06/24/23 Coccyx Mid Stage 2 -  Partial thickness loss of dermis presenting as a shallow open injury with a red, pink wound bed without slough. Dark red area, tiny open area in center (Active)  06/24/23 1842  Location: Coccyx  Location Orientation: Mid  Staging: Stage 2 -  Partial thickness loss of dermis presenting as a shallow open injury with a red, pink wound bed without slough.  Wound Description (Comments): Dark red area, tiny open area in center  Present on Admission:   Dressing Type Foam - Lift dressing to assess site every shift 04/08/24 0800     ------------------------------------------------------------------------------------------------------------------ Cultures; Micro Results:  1/4 Blood cultures from 04/03/2024 -reporting staph coccusHomins  Urine culture -growing Enterococcus faecalis Repeating blood cultures 04/05/2024>>> no growth to date ------------------------------------------------------------------------------------------------------------------ DVT prophylaxis:  TED hose Start: 04/03/24 1440 SCDs Start: 04/03/24 1440 apixaban  (ELIQUIS ) tablet 5 mg   Code Status:   Code Status: Full Code  Family Communication: Wife present at bedside updated -Advance care planning  has been discussed.   Admission status:   Status is: Inpatient Remains inpatient appropriate because: Needing IV fluids, IV antibiotics, treatment for sepsis   Disposition: From  - home             Planning for discharge in 1 days: to SNF vs Home with [facility]  04/09/2024   stable will be transferred out of ICU today 04/08/2024   Procedures:   No admission procedures for hospital encounter.   Antimicrobials:  Anti-infectives (From admission, onward)     Start     Dose/Rate Route Frequency Ordered Stop   04/07/24 1300  vancomycin (VANCOREADY) IVPB 1500 mg/300 mL  Status:  Discontinued        1,500 mg 150 mL/hr over 120 Minutes Intravenous Every 48 hours 04/05/24 1243 04/07/24 0730   04/07/24 1300  vancomycin (VANCOREADY) IVPB 1500 mg/300 mL        1,500 mg 150 mL/hr over 120 Minutes Intravenous Every 48 hours 04/07/24 0730 04/07/24 1455   04/06/24 2200  ampicillin  (OMNIPEN) 2 g in sodium chloride  0.9 % 100 mL IVPB        2 g 300 mL/hr over 20 Minutes Intravenous Every 8 hours 04/06/24 1445     04/05/24 1330  vancomycin (VANCOREADY) IVPB 2000 mg/400 mL        2,000 mg 200 mL/hr over 120 Minutes Intravenous  Once 04/05/24 1241 04/05/24 1717   04/04/24 1400  ampicillin  (OMNIPEN) 2 g in sodium chloride  0.9 % 100 mL IVPB  Status:  Discontinued        2 g 300 mL/hr over 20 Minutes Intravenous Every 6 hours 04/04/24 1242 04/06/24 1445   04/04/24 1000  cefTRIAXone  (ROCEPHIN ) 2 g in sodium chloride  0.9 % 100 mL IVPB  Status:  Discontinued        2 g 200 mL/hr over 30 Minutes Intravenous Every 24 hours 04/03/24 1444 04/04/24 1242   04/03/24 1230  cefTRIAXone  (ROCEPHIN ) 1 g in sodium chloride  0.9 % 100 mL IVPB        1 g 200 mL/hr over 30 Minutes Intravenous  Once 04/03/24 1218 04/03/24 1349        Medication:   apixaban   5 mg Oral BID   atorvastatin   80 mg Oral QHS   Chlorhexidine  Gluconate Cloth  6 each Topical Daily   ferrous sulfate   325 mg Oral QPM   fluticasone  furoate-vilanterol  1 puff Inhalation Daily   [START ON 04/09/2024] furosemide   20 mg Oral Daily   gabapentin   300 mg Oral BID   insulin  aspart  0-20 Units Subcutaneous TID WC   insulin  regular human CONCENTRATED  30 Units Subcutaneous Q breakfast   And   insulin  regular human CONCENTRATED  30 Units Subcutaneous Q lunch   And   insulin  regular human CONCENTRATED  30 Units Subcutaneous Q supper   ipratropium  0.5 mg Nebulization Q6H   ketoconazole   1 Application Topical Q  M,W,F   levalbuterol   0.63 mg Nebulization Q6H   senna-docusate  1 tablet Oral BID   sodium chloride  flush  3 mL Intravenous Q12H   tamsulosin   0.8 mg Oral QHS    acetaminophen  **OR** acetaminophen , HYDROmorphone  (DILAUDID ) injection, ondansetron  **OR** ondansetron  (ZOFRAN ) IV, oxyCODONE , sodium phosphate, traZODone    Objective:   Vitals:   04/08/24 0652 04/08/24 0700 04/08/24 0819 04/08/24 1009  BP:  (!) 104/56 (!) 106/39 131/70  Pulse: 76 68  72  Resp: (!) 21 20 18 18   Temp:  97.7 F (36.5 C) 98.2 F (36.8 C)  TempSrc:   Axillary Oral  SpO2: 94% 99% 95% 96%  Weight: 118 kg     Height:        Intake/Output Summary (Last 24 hours) at 04/08/2024 1057 Last data filed at 04/08/2024 0800 Gross per 24 hour  Intake 794.18 ml  Output 1600 ml  Net -805.82 ml   Filed Weights   04/05/24 0424 04/07/24 0326 04/08/24 0652  Weight: 117.4 kg 115.8 kg 118 kg     Physical examination:        General:  AAO x 3,  cooperative, no distress;   HEENT:  Normocephalic, PERRL, otherwise with in Normal limits   Neuro:  CNII-XII intact. , normal motor and sensation, reflexes intact   Lungs:   Clear to auscultation BL, Respirations unlabored,  No wheezes / crackles  Cardio:    S1/S2, RRR, No murmure, No Rubs or Gallops   Abdomen:  Soft, non-tender, bowel sounds active all four quadrants, no guarding or peritoneal signs.  Muscular  skeletal:  Limited exam -global generalized weaknesses - in bed, able to move all 4 extremities,   2+ pulses,  symmetric, No pitting edema  Skin:  Dry, warm to touch, negative for any Rashes,  Wounds: Please see nursing documentation  Pressure Injury 06/24/23 Coccyx Mid Stage 2 -  Partial thickness loss of dermis presenting as a shallow open injury with a red, pink wound bed without slough. Dark red area, tiny open area in center (Active)  06/24/23 1842  Location: Coccyx  Location Orientation: Mid  Staging: Stage 2 -  Partial thickness loss of dermis  presenting as a shallow open injury with a red, pink wound bed without slough.  Wound Description (Comments): Dark red area, tiny open area in center  Present on Admission:   Dressing Type Foam - Lift dressing to assess site every shift 04/08/24 0800                   ------------------------------------------------------------------------------------------------------------------------------------------    LABs:     Latest Ref Rng & Units 04/08/2024    6:52 AM 04/07/2024    6:26 AM 04/06/2024    5:40 AM  CBC  WBC 4.0 - 10.5 K/uL 4.2  3.9  2.9   Hemoglobin 13.0 - 17.0 g/dL 30.8  65.7  84.6   Hematocrit 39.0 - 52.0 % 35.5  36.2  37.6   Platelets 150 - 400 K/uL 137  128  109       Latest Ref Rng & Units 04/08/2024    5:59 AM 04/06/2024    5:40 AM 04/05/2024    6:09 AM  CMP  Glucose 70 - 99 mg/dL 962  952  841   BUN 8 - 23 mg/dL 72  67  64   Creatinine 0.61 - 1.24 mg/dL 3.24  4.01  0.27   Sodium 135 - 145 mmol/L 140  139  139   Potassium 3.5 - 5.1 mmol/L 5.2  4.9  5.2   Chloride 98 - 111 mmol/L 106  103  102   CO2 22 - 32 mmol/L 24  26  30    Calcium  8.9 - 10.3 mg/dL 8.7  8.2  8.2   Total Protein 6.5 - 8.1 g/dL  5.9  6.4   Total Bilirubin 0.0 - 1.2 mg/dL  0.4  0.3   Alkaline Phos 38 - 126 U/L  65  73   AST 15 - 41 U/L  13  17  ALT 0 - 44 U/L  13  17           Radiology Reports No results found.   SIGNED: Bobbetta Burnet, MD, FHM. FAAFP. Arlin Benes - [facility] Critical care time spent - 55 min.  In seeing, evaluating and examining the patient. Reviewing medical records, labs, drawn plan of care. [facility],  Pager (please use [email or web address] to page/ text) Please use Epic Secure Chat for non-urgent communication (7AM-7PM)  If 7PM-7AM, please contact night-coverage [email or web address], 04/08/2024, 10:57 AM

## 2024-04-08 NOTE — Plan of Care (Signed)

## 2024-04-08 NOTE — Progress Notes (Signed)
 Mobility Specialist Progress Note:    04/08/24 1300  Mobility  Activity Stood at bedside  Level of Assistance Maximum assist, patient does 25-49%  Assistive Device Front wheel walker  Range of Motion/Exercises Active;All extremities  Activity Response Tolerated well  Mobility Referral No  Mobility visit 1 Mobility  Mobility Specialist Start Time (ACUTE ONLY) 1240  Mobility Specialist Stop Time (ACUTE ONLY) 1300  Mobility Specialist Time Calculation (min) (ACUTE ONLY) 20 min   Pt received in bed, NT requesting assistance to transfer to [facility]. Required MaxA to stand with RW. Pt was unable to take steps at this time. Had to return pt supine, unable to make transfer. Wife at bedside, all needs met.  Glinda Lapping Mobility Specialist Please contact via Special educational needs teacher or  Rehab office at [phone]

## 2024-04-08 NOTE — Progress Notes (Signed)
   04/08/24 2307  BiPAP/CPAP/SIPAP  BiPAP/CPAP/SIPAP Pt Type Adult  BiPAP/CPAP/SIPAP [name]  Mask Type Full face mask  Mask Size Large  Respiratory Rate 20 breaths/min  EPAP  (patient's home unit; auto titration mode)  Flow Rate 6 lpm  Patient Home Machine Yes  Safety Check Completed by RT for Home Unit Yes, no issues noted  Patient Home Mask No  Patient Home Tubing Yes  Auto Titrate Yes  BiPAP/CPAP /SiPAP Vitals  Pulse Rate 71  Resp 20  SpO2 93 %  Bilateral Breath Sounds Clear;Diminished  MEWS Score/Color  MEWS Score 0  MEWS Score Color Marrie Sizer

## 2024-04-09 DIAGNOSIS — A419 Sepsis, unspecified organism: Secondary | ICD-10-CM | POA: Diagnosis not present

## 2024-04-09 DIAGNOSIS — R6520 Severe sepsis without septic shock: Secondary | ICD-10-CM | POA: Diagnosis not present

## 2024-04-09 DIAGNOSIS — J9600 Acute respiratory failure, unspecified whether with hypoxia or hypercapnia: Secondary | ICD-10-CM | POA: Diagnosis not present

## 2024-04-09 LAB — BASIC METABOLIC PANEL WITH GFR
Anion gap: 7 (ref 5–15)
BUN: 71 mg/dL — ABNORMAL HIGH (ref 8–23)
CO2: 29 mmol/L (ref 22–32)
Calcium: 8.8 mg/dL — ABNORMAL LOW (ref 8.9–10.3)
Chloride: 106 mmol/L (ref 98–111)
Creatinine, Ser: 3 mg/dL — ABNORMAL HIGH (ref 0.61–1.24)
GFR, Estimated: 21 mL/min — ABNORMAL LOW (ref >60)
Glucose, Bld: 76 mg/dL (ref 70–99)
Potassium: 5 mmol/L (ref 3.5–5.1)
Sodium: 142 mmol/L (ref 135–145)

## 2024-04-09 LAB — GLUCOSE, CAPILLARY
Glucose-Capillary: 90 mg/dL (ref 70–99)
Glucose-Capillary: 199 mg/dL — ABNORMAL HIGH (ref 70–99)

## 2024-04-09 LAB — BRAIN NATRIURETIC PEPTIDE: B Natriuretic Peptide: 109 pg/mL — ABNORMAL HIGH (ref 0.0–100.0)

## 2024-04-09 MED ORDER — FUROSEMIDE 20 MG PO TABS: 20.0000 mg | ORAL_TABLET | Freq: Every day | ORAL | 30 refills | Status: DC

## 2024-04-09 MED ORDER — LEVALBUTEROL HCL 0.63 MG/3ML IN NEBU: 0.6300 mg | INHALATION_SOLUTION | Freq: Two times a day (BID) | RESPIRATORY_TRACT | Status: DC

## 2024-04-09 MED ORDER — IPRATROPIUM BROMIDE 0.02 % IN SOLN: 0.5000 mg | Freq: Two times a day (BID) | RESPIRATORY_TRACT | 12 refills | Status: DC

## 2024-04-09 MED ORDER — INSULIN REGULAR HUMAN (CONC) 500 UNIT/ML ~~LOC~~ SOPN
50.0000 [IU] | PEN_INJECTOR | SUBCUTANEOUS | 5 refills | Status: DC
Start: 2024-04-09 — End: 2024-04-17

## 2024-04-09 MED ORDER — IPRATROPIUM BROMIDE 0.02 % IN SOLN: 0.5000 mg | Freq: Two times a day (BID) | RESPIRATORY_TRACT | Status: DC

## 2024-04-09 MED ORDER — LEVALBUTEROL HCL 0.63 MG/3ML IN NEBU: 0.6300 mg | INHALATION_SOLUTION | Freq: Two times a day (BID) | RESPIRATORY_TRACT | 12 refills | Status: DC

## 2024-04-09 NOTE — Progress Notes (Signed)
 Blood Sugar 199, armband would not scan for CBG machine.

## 2024-04-09 NOTE — Plan of Care (Signed)
  Problem: Acute Rehab OT Goals (only OT should resolve) Goal: Pt. Will Perform Grooming Flowsheets (Taken 04/09/2024 1049) Pt Will Perform Grooming: with modified independence Goal: Pt. Will Perform Upper Body Dressing Flowsheets (Taken 04/09/2024 1049) Pt Will Perform Upper Body Dressing:  with modified independence  sitting Goal: Pt. Will Perform Lower Body Dressing Flowsheets (Taken 04/09/2024 1049) Pt Will Perform Lower Body Dressing:  with modified independence  sitting/lateral leans Goal: Pt. Will Transfer To Toilet Flowsheets (Taken 04/09/2024 1049) Pt Will Transfer to Toilet:  with contact guard assist  with supervision  stand pivot transfer Goal: Pt. Will Perform Toileting-Clothing Manipulation Flowsheets (Taken 04/09/2024 1049) Pt Will Perform Toileting - Clothing Manipulation and hygiene:  with contact guard assist  sitting/lateral leans Goal: Pt/Caregiver Will Perform Home Exercise Program Flowsheets (Taken 04/09/2024 1049) Pt/caregiver will Perform Home Exercise Program:  Increased strength  Both right and left upper extremity  Independently  Eyanna Mcgonagle OT, MOT

## 2024-04-09 NOTE — NC FL2 (Signed)
   MEDICAID FL2 LEVEL OF CARE FORM     IDENTIFICATION  Patient Name: Ricky SEMINARA Sr. Birthdate: 1950/12/06 Sex: male Admission Date (Current Location): 04/03/2024  [facility] and [place] Number:  [employer] and Address:  [facility],  [address] [place] [address]      Provider Number: [id number]  Attending Physician Name and Address:  Bobbetta Burnet, MD  Relative Name and Phone Number:  Khory Mazzarese [phone] (wife)    Current Level of Care: Hospital Recommended Level of Care: Skilled Nursing Facility Prior Approval Number:    Date Approved/Denied:   PASRR Number: [id number]  Discharge Plan: SNF    Current Diagnoses: Patient Active Problem List   Diagnosis Date Noted   Sepsis (HCC) 04/03/2024   A-fib (HCC) 04/03/2024   Renal insufficiency 06/19/2023   Acute and chronic respiratory failure with hypercapnia (HCC) 06/19/2023   Bradycardia 05/13/2023   Acute on chronic respiratory failure with hypoxia and hypercapnia (HCC) 05/11/2023   Acute hypoxic respiratory failure (HCC) 05/11/2023   Class 2 obesity 05/07/2023   CKD stage 3a, GFR 45-59 ml/min (HCC) 05/07/2023   Impaired functional mobility, balance, gait, and endurance 09/27/2018   Unilateral primary osteoarthritis, left hip 02/10/2017   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   H/O total hip arthroplasty, left 02/10/2017   History of total hip arthroplasty 02/10/2017   Respiratory failure (HCC)    OSA (obstructive sleep apnea)    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   HTN (hypertension) 10/13/2016   Anemia 03/02/2016   BPH (benign prostatic hyperplasia) 03/02/2016   Type 2 diabetes mellitus with hyperlipidemia (HCC) 03/02/2016   OAB (overactive bladder) 03/02/2016   CAD (coronary artery disease) 02/09/2015   Morbid obesity with BMI of 40.0-44.9, adult (HCC) 01/29/2015   Bladder cancer (HCC) 01/29/2015   Chronic systolic heart failure (HCC)    Esophageal  reflux    Chest pain 01/08/2015   Insulin  dependent diabetes mellitus 01/08/2015   COPD (chronic obstructive pulmonary disease) (HCC) 01/08/2015   Hyperlipidemia associated with type 2 diabetes mellitus (HCC) 01/08/2015    Orientation RESPIRATION BLADDER Height & Weight     Self, Situation, Place  O2 (4L) External catheter Weight: 275 lb 5.7 oz (124.9 kg) Height:  5\' 8"  (172.7 cm)  BEHAVIORAL SYMPTOMS/MOOD NEUROLOGICAL BOWEL NUTRITION STATUS      Continent Diet (see discharge summary)  AMBULATORY STATUS COMMUNICATION OF NEEDS Skin   Extensive Assist Verbally Other (Comment) (Redness to bilateral buttocks. Stage II to coccyx with foam dressing.)                       Personal Care Assistance Level of Assistance  Bathing, Dressing, Feeding Bathing Assistance: Maximum assistance Feeding assistance: Limited assistance Dressing Assistance: Maximum assistance     Functional Limitations Info  Sight, Hearing, Speech Sight Info: Adequate Hearing Info: Impaired Speech Info: Adequate    SPECIAL CARE FACTORS FREQUENCY  PT (By licensed PT)     PT Frequency: 5x weekly              Contractures      Additional Factors Info  Code Status, Allergies, Insulin  Sliding Scale Code Status Info: Full Allergies Info: Empagliflozin , Poison Ivy Extract, Poison Oak Extract   Insulin  Sliding Scale Info: Resistant scale       Current Medications (04/09/2024):  This is the current hospital active medication list Current Facility-Administered Medications  Medication Dose Route Frequency Provider Last Rate Last Admin  acetaminophen  (TYLENOL ) tablet 650 mg  650 mg Oral Q6H PRN Shahmehdi, Seyed A, MD       Or   acetaminophen  (TYLENOL ) suppository 650 mg  650 mg Rectal Q6H PRN Shahmehdi, Seyed A, MD       ampicillin  (OMNIPEN) 2 g in sodium chloride  0.9 % 100 mL IVPB  2 g Intravenous Q8H Olin Bertin, RPH 300 mL/hr at 04/09/24 0559 2 g at 04/09/24 0559   apixaban  (ELIQUIS ) tablet 5 mg   5 mg Oral BID Shahmehdi, Seyed A, MD   5 mg at 04/08/24 2124   atorvastatin  (LIPITOR ) tablet 80 mg  80 mg Oral QHS Shahmehdi, Seyed A, MD   80 mg at 04/08/24 2125   ferrous sulfate  tablet 325 mg  325 mg Oral QPM Shahmehdi, Seyed A, MD   325 mg at 04/08/24 1700   fluticasone  furoate-vilanterol (BREO ELLIPTA ) 200-25 MCG/ACT 1 puff  1 puff Inhalation Daily Shahmehdi, Seyed A, MD   1 puff at 04/09/24 0803   furosemide  (LASIX ) tablet 20 mg  20 mg Oral Daily Shahmehdi, Seyed A, MD       gabapentin  (NEURONTIN ) capsule 200 mg  200 mg Oral BID Shahmehdi, Seyed A, MD   200 mg at 04/08/24 2125   HYDROmorphone  (DILAUDID ) injection 0.5-1 mg  0.5-1 mg Intravenous Q2H PRN Shahmehdi, Domenick Friedlander A, MD       insulin  aspart (novoLOG ) injection 0-20 Units  0-20 Units Subcutaneous TID WC Shahmehdi, Seyed A, MD   4 Units at 04/08/24 1659   insulin  regular human CONCENTRATED (HUMULIN R ) 500 UNIT/ML KwikPen 30 Units  30 Units Subcutaneous Q breakfast Shahmehdi, Seyed A, MD   30 Units at 04/08/24 0759   And   insulin  regular human CONCENTRATED (HUMULIN R ) 500 UNIT/ML KwikPen 30 Units  30 Units Subcutaneous Q lunch Shahmehdi, Seyed A, MD   30 Units at 04/08/24 1305   And   insulin  regular human CONCENTRATED (HUMULIN R ) 500 UNIT/ML KwikPen 30 Units  30 Units Subcutaneous Q supper Shahmehdi, Seyed A, MD   30 Units at 04/08/24 1659   ipratropium (ATROVENT ) nebulizer solution 0.5 mg  0.5 mg Nebulization Q6H Shahmehdi, Seyed A, MD   0.5 mg at 04/09/24 0709   ketoconazole  (NIZORAL ) 2 % shampoo 1 Application  1 Application Topical Q M,W,F Shahmehdi, Seyed A, MD   1 Application at 04/08/24 1018   levalbuterol  (XOPENEX ) nebulizer solution 0.63 mg  0.63 mg Nebulization Q6H Shahmehdi, Seyed A, MD   0.63 mg at 04/09/24 0709   ondansetron  (ZOFRAN ) tablet 4 mg  4 mg Oral Q6H PRN Shahmehdi, Seyed A, MD       Or   ondansetron  (ZOFRAN ) injection 4 mg  4 mg Intravenous Q6H PRN Shahmehdi, Seyed A, MD       oxyCODONE  (Oxy IR/ROXICODONE ) immediate  release tablet 5 mg  5 mg Oral Q4H PRN Shahmehdi, Seyed A, MD   5 mg at 04/06/24 2209   senna-docusate (Senokot-S) tablet 1 tablet  1 tablet Oral BID Amber Bail A, MD   1 tablet at 04/08/24 2125   sodium chloride  flush (NS) 0.9 % injection 3 mL  3 mL Intravenous Q12H Shahmehdi, Seyed A, MD   3 mL at 04/08/24 2125   sodium phosphate (FLEET) enema 1 enema  1 enema Rectal Once PRN Shahmehdi, Seyed A, MD       tamsulosin  (FLOMAX ) capsule 0.8 mg  0.8 mg Oral QHS Shahmehdi, Seyed A, MD   0.8 mg at 04/08/24 2124  traZODone  (DESYREL ) tablet 25 mg  25 mg Oral QHS PRN Bobbetta Burnet, MD         Discharge Medications: Please see discharge summary for a list of discharge medications.  Relevant Imaging Results:  Relevant Lab Results:   Additional Information SS # [id number]  Ander Katos, LCSW

## 2024-04-09 NOTE — Evaluation (Addendum)
 Occupational Therapy Evaluation Patient Details Name: Ricky HANDCOCK Sr. MRN: [id number] DOB: [birth year] Today's Date: 04/09/2024   History of Present Illness   Ricky ESTABROOK Sr. Is a 73 year old male with extensive history of HTN, HLD, chronic systolic CHF, CAD, COPD, with chronically dependent 2L O2, OSA, on CPAP, DM2, GERD, anemia of chronic disease, morbid obesity, cardiac, bladder cancer under treatment now.  Presenting with progressive generalized weakness for the past 3 days.  Uses walker at baseline, now wife uses Nurse, adult.  And confusion.  Wife confirms that he is usually on 2 L of oxygen via nasal cannula, for CHF and COPD, now having more shortness of breath, lower extremity edema.  Poor p.o. intake in the past 3 days.     Clinical Impressions Pt agreeable to OT and PT co-evaluation. Pt found to have a bowel movement in bed and required assist from NT's to clean. Mod to max A for bed mobility and transfer to chair. Poor seated balance with posterior lean intermittently. Unable to don socks today. Uses sock aid and reacher at baseline. B UE generally weak. Pt reports his spouse cannot take care of him in his current condition. Pt taken down to 2L supplemental O2 during session but desaturated to ~80%. Pt placed back ton 3 to 4 LPM and improved to above 90%. Pt left in the chair with call bell within reach. Pt will benefit from continued OT in the hospital and recommended venue below to increase strength, balance, and endurance for safe ADL's.        If plan is discharge home, recommend the following:   A lot of help with walking and/or transfers;A lot of help with bathing/dressing/bathroom;Assistance with cooking/housework;Assist for transportation;Help with stairs or ramp for entrance     Functional Status Assessment   Patient has had a recent decline in their functional status and demonstrates the ability to make significant improvements in function in a reasonable and  predictable amount of time.     Equipment Recommendations   None recommended by OT             Precautions/Restrictions   Precautions Precautions: Fall Recall of Precautions/Restrictions: Intact Restrictions Weight Bearing Restrictions Per Provider Order: No     Mobility Bed Mobility Overal bed mobility: Needs Assistance Bed Mobility: Supine to Sit     Supine to sit: Mod assist, Max assist, HOB elevated     General bed mobility comments: Pt weak and needing assist to move  B LE to EOB and to pull to sit.    Transfers Overall transfer level: Needs assistance Equipment used: Rolling walker (2 wheels) Transfers: Sit to/from Stand, Bed to chair/wheelchair/BSC Sit to Stand: Mod assist, Max assist     Step pivot transfers: Mod assist, Max assist     General transfer comment: labored movement; unsteady in standing      Balance Overall balance assessment: Needs assistance Sitting-balance support: Bilateral upper extremity supported, Feet supported Sitting balance-Leahy Scale: Poor Sitting balance - Comments: seated at EOB Postural control: Posterior lean Standing balance support: Bilateral upper extremity supported, During functional activity, Reliant on assistive device for balance Standing balance-Leahy Scale: Poor Standing balance comment: using RW                           ADL either performed or assessed with clinical judgement   ADL Overall ADL's : Needs assistance/impaired     Grooming: Contact guard assist;Minimal assistance;Sitting  Upper Body Bathing: Contact guard assist;Minimal assistance;Sitting   Lower Body Bathing: Total assistance;Maximal assistance;Sitting/lateral leans   Upper Body Dressing : Contact guard assist;Minimal assistance;Sitting   Lower Body Dressing: Maximal assistance;Total assistance;Sitting/lateral leans   Toilet Transfer: Moderate assistance;Maximal assistance;Stand-pivot;Rolling walker (2 wheels) Toilet  Transfer Details (indicate cue type and reason): Simulated via EOB to chair. Toileting- Clothing Manipulation and Hygiene: Maximal assistance;Total assistance;Sitting/lateral lean               Vision Baseline Vision/History: 1 Wears glasses Ability to See in Adequate Light: 1 Impaired Patient Visual Report: No change from baseline Vision Assessment?: No apparent visual deficits     Perception Perception: Not tested       Praxis Praxis: Not tested       Pertinent Vitals/Pain Pain Assessment Pain Assessment: No/denies pain     Extremity/Trunk Assessment Upper Extremity Assessment Upper Extremity Assessment: Generalized weakness   Lower Extremity Assessment Lower Extremity Assessment: Defer to PT evaluation   Cervical / Trunk Assessment Cervical / Trunk Assessment: Kyphotic   Communication Communication Communication: No apparent difficulties   Cognition Arousal: Alert Behavior During Therapy: WFL for tasks assessed/performed, Agitated Cognition: No apparent impairments                               Following commands: Intact       Cueing  General Comments   Cueing Techniques: Verbal cues;Tactile cues                 Home Living Family/patient expects to be discharged to:: Private residence Living Arrangements: Spouse/significant other Available Help at Discharge: Family;Available 24 hours/day Type of Home: House Home Access: Ramped entrance     Home Layout: One level     Bathroom Shower/Tub: Producer, television/film/video: Standard Bathroom Accessibility: Yes How Accessible: Accessible via walker Home Equipment: Rollator (4 wheels);Cane - single point;Shower seat;Grab bars - toilet;Grab bars - tub/shower;Wheelchair - manual;Hospital bed;Adaptive equipment Adaptive Equipment: Reacher;Sock aid        Prior Functioning/Environment Prior Level of Function : Needs assist       Physical Assist : ADLs (physical)   ADLs  (physical): IADLs Mobility Comments: Assisted household ambulation using RW, uses wheelchair for longer distances ADLs Comments: Independent ADL; assist IADL    OT Problem List: Decreased strength;Decreased activity tolerance;Impaired balance (sitting and/or standing);Obesity   OT Treatment/Interventions: Self-care/ADL training;Therapeutic exercise;DME and/or AE instruction;Therapeutic activities;Patient/family education;Balance training      OT Goals(Current goals can be found in the care plan section)   Acute Rehab OT Goals Patient Stated Goal: improve function OT Goal Formulation: With patient Time For Goal Achievement: 04/23/24 Potential to Achieve Goals: Good   OT Frequency:  Min 2X/week    Co-evaluation PT/OT/SLP Co-Evaluation/Treatment: Yes Reason for Co-Treatment: To address functional/ADL transfers   OT goals addressed during session: ADL's and self-care                       End of Session Equipment Utilized During Treatment: Rolling walker (2 wheels);Gait belt;Oxygen  Activity Tolerance: Patient tolerated treatment well Patient left: in chair;with call bell/phone within reach  OT Visit Diagnosis: Unsteadiness on feet (R26.81);Other abnormalities of gait and mobility (R26.89);Muscle weakness (generalized) (M62.81)                Time: 4098-1191 OT Time Calculation (min): 33 min Charges:  OT General Charges $OT Visit: 1 Visit OT  Evaluation $OT Eval Moderate Complexity: 1 Mod  Fatou Dunnigan OT, MOT  Thurnell Floss 04/09/2024, 10:46 AM

## 2024-04-09 NOTE — Progress Notes (Signed)
 Physical Therapy Treatment Patient Details Name: Ricky GRONAU Sr. MRN: [id number] DOB: 12/28/1950 Today's Date: 04/09/2024   History of Present Illness Ricky WINES Sr. Is a 73 year old male with extensive history of HTN, HLD, chronic systolic CHF, CAD, COPD, with chronically dependent 2L O2, OSA, on CPAP, DM2, GERD, anemia of chronic disease, morbid obesity, cardiac, bladder cancer under treatment now.  Presenting with progressive generalized weakness for the past 3 days.  Uses walker at baseline, now wife uses Nurse, adult.  And confusion.  Wife confirms that he is usually on 2 L of oxygen via nasal cannula, for CHF and COPD, now having more shortness of breath, lower extremity edema.  Poor p.o. intake in the past 3 days.    PT Comments  Patient demonstrates slow labored movement for rolling side to side and sitting up at bedside requiring increased time for initiating movement and HOB raised due to weakness.  Patient very unsteady on feet and limited to a few side steps before having to sit due fatigue and generalized weakness. Patient tolerated sitting up in chair after therapy. Patient will benefit from continued skilled physical therapy in hospital and recommended venue below to increase strength, balance, endurance for safe ADLs and gait.       If plan is discharge home, recommend the following: A lot of help with walking and/or transfers;Help with stairs or ramp for entrance;Assistance with cooking/housework;A lot of help with bathing/dressing/bathroom   Can travel by private vehicle        Equipment Recommendations  None recommended by PT    Recommendations for Other Services       Precautions / Restrictions Precautions Precautions: Fall Recall of Precautions/Restrictions: Intact Restrictions Weight Bearing Restrictions Per Provider Order: No     Mobility  Bed Mobility Overal bed mobility: Needs Assistance Bed Mobility: Supine to Sit     Supine to sit: Mod assist, Max  assist, HOB elevated     General bed mobility comments: increased time for initiation, slow labored movement requiring HOB elevated    Transfers Overall transfer level: Needs assistance Equipment used: Rolling walker (2 wheels) Transfers: Sit to/from Stand, Bed to chair/wheelchair/BSC Sit to Stand: Mod assist, Max assist   Step pivot transfers: Mod assist, Max assist       General transfer comment: unsteady labored movement    Ambulation/Gait Ambulation/Gait assistance: Mod assist, Max assist Gait Distance (Feet): 5 Feet Assistive device: Rolling walker (2 wheels) Gait Pattern/deviations: Decreased step length - right, Decreased step length - left, Decreased stride length, Trunk flexed Gait velocity: slow     General Gait Details: limited to a few slow labored side steps before having to sit due to weakness   Stairs             Wheelchair Mobility     Tilt Bed    Modified Rankin (Stroke Patients Only)       Balance Overall balance assessment: Needs assistance Sitting-balance support: Feet supported, No upper extremity supported Sitting balance-Leahy Scale: Poor Sitting balance - Comments: seated at EOB Postural control: Posterior lean Standing balance support: Bilateral upper extremity supported, During functional activity, Reliant on assistive device for balance Standing balance-Leahy Scale: Poor Standing balance comment: using RW                            Communication Communication Communication: No apparent difficulties  Cognition Arousal: Alert Behavior During Therapy: WFL for tasks assessed/performed, Agitated  PT - Cognitive impairments: No apparent impairments                         Following commands: Intact      Cueing Cueing Techniques: Verbal cues, Tactile cues  Exercises      General Comments        Pertinent Vitals/Pain Pain Assessment Pain Assessment: No/denies pain    Home Living Family/patient  expects to be discharged to:: Private residence Living Arrangements: Spouse/significant other Available Help at Discharge: Family;Available 24 hours/day Type of Home: House Home Access: Ramped entrance       Home Layout: One level Home Equipment: Rollator (4 wheels);Cane - single point;Shower seat;Grab bars - toilet;Grab bars - tub/shower;Wheelchair - manual;Hospital bed;Adaptive equipment      Prior Function            PT Goals (current goals can now be found in the care plan section) Acute Rehab PT Goals Patient Stated Goal: return home with family to assist PT Goal Formulation: With patient/family Time For Goal Achievement: 04/16/24 Potential to Achieve Goals: Good Progress towards PT goals: Progressing toward goals    Frequency    Min 3X/week      PT Plan      Co-evaluation PT/OT/SLP Co-Evaluation/Treatment: Yes Reason for Co-Treatment: To address functional/ADL transfers PT goals addressed during session: Mobility/safety with mobility;Balance;Proper use of DME OT goals addressed during session: ADL's and self-care      AM-PAC PT "6 Clicks" Mobility   Outcome Measure  Help needed turning from your back to your side while in a flat bed without using bedrails?: A Lot Help needed moving from lying on your back to sitting on the side of a flat bed without using bedrails?: A Lot Help needed moving to and from a bed to a chair (including a wheelchair)?: A Lot Help needed standing up from a chair using your arms (e.g., wheelchair or bedside chair)?: A Lot Help needed to walk in hospital room?: A Lot Help needed climbing 3-5 steps with a railing? : Total 6 Click Score: 11    End of Session Equipment Utilized During Treatment: Oxygen Activity Tolerance: Patient tolerated treatment well;Patient limited by fatigue Patient left: in chair;with call bell/phone within reach;with chair alarm set Nurse Communication: Mobility status PT Visit Diagnosis: Unsteadiness on  feet (R26.81);Other abnormalities of gait and mobility (R26.89);Muscle weakness (generalized) (M62.81)     Time: 1610-9604 PT Time Calculation (min) (ACUTE ONLY): 34 min  Charges:    $Therapeutic Activity: 23-37 mins PT General Charges $$ ACUTE PT VISIT: 1 Visit                     11:39 AM, 04/09/24 Walton Guppy, MPT Physical Therapist with [facility] [phone] office [phone] mobile phone

## 2024-04-09 NOTE — TOC Progression Note (Signed)
 Transition of Care (TOC) - Progression Note    Patient Details  Name: Ricky ELLENDER Sr. MRN: [id number] Date of Birth: 09-Mar-1951  Transition of Care [facility]) CM/SW Contact  Ander Katos, [place] Phone Number: 04/09/2024, 12:03 PM  Clinical Narrative:  PT evaluated pt and recommend SNF. LCSW attempted to discuss with pt, but unable to wake pt up enough to have conversation. Pt's wife requests [place], [place], or [address] Reviewed Medicare.gov ratings. Will follow up with bed offers when available.      Expected Discharge Plan: Skilled Nursing Facility Barriers to Discharge: Continued Medical Work up  Expected Discharge Plan and Services In-house Referral: Clinical Social Work Discharge Planning Services: CM Consult Post Acute Care Choice: Skilled Nursing Facility Living arrangements for the past 2 months: Single Family Home Expected Discharge Date: 04/09/24                         HH Arranged: PT HH Agency: [facility]         Social Determinants of Health (SDOH) Interventions SDOH Screenings   Food Insecurity: Low Risk  (01/04/2024)   Received from [facility]  Housing: Low Risk  (04/03/2024)  Transportation Needs: No Transportation Needs (01/04/2024)   Received from [facility]  Utilities: Not At Risk (04/03/2024)  Depression (PHQ2-9): Low Risk  (11/08/2021)  Tobacco Use: Medium Risk (04/04/2024)    Readmission Risk Interventions    04/03/2024    8:38 PM  Readmission Risk Prevention Plan  Transportation Screening Complete  Medication Review (RN Care Manager) Complete  PCP or Specialist appointment within 3-5 days of discharge Complete  HRI or Home Care Consult Complete  SW Recovery Care/Counseling Consult Complete  Palliative Care Screening Not Applicable  Skilled Nursing Facility Complete

## 2024-04-09 NOTE — Discharge Summary (Signed)
 Physician Discharge Summary   Patient: Ricky DERAAD Sr. MRN: [id number] DOB: Sep 07, 1951  Admit date:     04/03/2024  Discharge date: 04/09/24  Discharge Physician: Bobbetta Burnet   PCP: [facility]   Recommendations at discharge:   Follow-up with PCP in 1 week Follow-up with oncologist in 1-2 weeks Continue PT OT, fall precautions With palliative care team as outpatient  Discharge Diagnoses: Principal Problem:   Sepsis (HCC) Active Problems:   Urothelial carcinoma (HCC)   COPD (chronic obstructive pulmonary disease) (HCC)   Chronic systolic heart failure (HCC)   HTN (hypertension)   CKD stage 3a, GFR 45-59 ml/min (HCC)   Morbid obesity with BMI of 40.0-44.9, adult (HCC)   Hyperlipidemia associated with type 2 diabetes mellitus (HCC)   Esophageal reflux   OSA (obstructive sleep apnea)   Impaired functional mobility, balance, gait, and endurance   Bladder cancer (HCC)   Renal insufficiency   A-fib (HCC)  Resolved Problems:   * No resolved hospital problems. *  Hospital Course: CAMON BARB Sr. Is a 73 year old male with extensive history of HTN, HLD, chronic systolic CHF, CAD, COPD, with chronically dependent 2L O2, OSA, on CPAP, DM2, GERD, anemia of chronic disease, morbid obesity, cardiac, bladder cancer under treatment now. Presenting with progressive generalized weakness for the past 3 days.  Uses walker at baseline, now wife uses Nurse, adult.  And confusion. Wife confirms that he is usually on 2 L of oxygen via nasal cannula, for CHF and COPD, now having more shortness of breath, lower extremity edema.  Poor p.o. intake in the past 3 days.   ED Evaluation/course: Blood pressure (!) 104/48, Pulse (!) 58, Temp. 98.4 F (36.9 C), RR (!) 29, height 5\' 8"  (1.727 m), weight 113.4 kg, SpO2 94%.  Labs: Sodium 134, potassium 5.2, chloride 94, glucose 213, BUN 52, creatinine 2.87, calcium  8.5, BNP 153, troponin 26, 23, WBC 6.4, UA: Cloudy, large hemoglobin,  leukocyte Estrace, rare bacteria, WBC >50 Chest x-ray-cardiomegaly, vascular congestion, possible pulmonary edema  Patient met sepsis criteria due to UTI, requested to be admitted for IV antibiotics, IV fluids    Sepsis (HCC) -with toxic metabolic encephalopathy -Improved sepsis physiology, improved mentation - Neuro-and hemodynamically stable this a.m.   POA: Met sepsis criteria hypotension, tachycardia, tachypnea Source of infection UTI Temp of 98.4, pulse 108, RR 28, BP 109/44, on BiPAP 40% FiO2, satting 96%   - Blood cultures are staph., ?  Contaminant, urine culture growing Enterococcus faecalis   -Switch IV Rocephin  >> Ampicillin , empiric coverage for gram-positive with vancomycin - Discontinue vancomycin   -WBC 6.4, 4.0, 4.6, 4.2  - Lactic acid 0.9>> 1.0 - Discontinue IVF   Hyperglycemic with history of DM2, - Episodes of hypoglycemia, monitoring CBG, stable now    - On concentrated Humulin R  TID, stable on reduced dose CBG (last 3)  Recent Labs (last 2 labs)       Recent Labs    04/07/24 2347 04/08/24 0037 04/08/24 0716  GLUCAP 60* 119* 110*          Bladder cancer - Urothelial carcinoma (HCC) -as needed  Bladder/urethral cath -Patient is to continue outpatient follow-up and treatment - Undertreatment last chemo 04/08/24   CT - stone IMPRESSION: *Right lower lobe atelectasis and right pleural reaction. *Cholelithiasis without gallbladder inflammatory changes. *Left kidney is absent. *Diffuse bladder wall thickening with a bubble of air in the bladder that could correlate with recent instrumentation. Findings could correlate with chronic bladder  outlet obstruction. *Fecal impaction rectum without obstruction. *Aortic atherosclerosis.   Acute on chronic respiratory failure COPD (chronic obstructive pulmonary disease) (HCC)/ w OSA  - BiPAP nightly, back to O2 2 L at baseline,  - Down to baseline 3 L of oxygen, satting 96%   -Was hypoxic 04/05/24 ABG: Was  reviewed, with hypercapnia, respiratory acidosis-which has improved   -Chest x-ray reporting congestion -Continue O2 sat 92-88%, as needed DuoNeb,    Chronic systolic heart failure (HCC) Chronic systolic congestive heart failure with preserved ejection fraction -Last echo: 06/21/2023: 50 to 55%, LV function and size The right ventricular size is moderately enlarged. Right ventricular systolic  function is moderately reduced.  - Discontinuing IVF, chest x-ray reporting congestion, - Due to elevated creatinine holding diuretics Lasix    CKD stage 3a, GFR 45-59 ml/min (HCC) - Avoid nephrotoxins, hypotension -Baseline creatinine around 1.2-2.0  POA.  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 -Discussing regarding fluid management, weight loss, Close follow-up with PCP   A-fib (HCC)- H/o Afib - per wife was [facility] on Eliquis  at [facility]      Esophageal reflux - Continue PPI   Hyperlipidemia - Continue statins   HTN (hypertension) -POA hypotensive exacerbated by sepsis - Holding BP meds for now including Aldactone   - Holding Lasix  today     Impaired functional mobility, balance, gait, and endurance - Patient usually ambulates with walker, now requiring assist with all ADLs, including Hoyer lift -Consulted PT OT for evaluation, anticipating rehab, SNF on discharge     -------------------------------------------------------------------------------------------------------------------------- Nutritional status:  The patient's BMI is: Body mass  index is 39.55 kg/m. I agree with the assessment and plan as outlined   Skin Assessment: I have examined the patient's skin and I agree with the wound assessment as performed by wound care team As outlined belowe:     Pressure Injury 06/24/23 Coccyx Mid Stage 2 -  Partial thickness loss of dermis presenting as a shallow open injury with a red, pink wound bed without slough. Dark red area, tiny open area in center (Active)  06/24/23 1842  Location: Coccyx  Location Orientation: Mid  Staging: Stage 2 -  Partial thickness loss of dermis presenting as a shallow open injury with a red, pink wound bed without slough.  Wound Description (Comments): Dark red area, tiny open area in center  Present on Admission:   Dressing Type Foam - Lift dressing to assess site every shift 04/08/24 0800        ------------------------------------------------------------------------------------------------------------------ Cultures; Micro Results:  1/4 Blood cultures from 04/03/2024 -reporting staph coccusHomins  Urine culture -growing Enterococcus faecalis Repeating blood cultures 04/05/2024>>> no growth to date ------------------------------------------------------------------------------------------------------------------ DVT prophylaxis:  TED hose Start: 04/03/24 1440 SCDs Start: 04/03/24 1440 apixaban  (ELIQUIS ) tablet 5 mg    Code Status:   Code Status: Full Code   Family Communication: Wife present at bedside updated        Procedures performed: none   Disposition: Skilled nursing facility Diet recommendation:  Discharge Diet Orders (From admission, onward)     Start  Ordered   04/09/24 0000  Diet - low sodium heart healthy        04/09/24 0849           Cardiac and Carb modified diet DISCHARGE MEDICATION: Allergies as of 04/09/2024       Reactions   Empagliflozin  Other (See Comments)   Urinary tract infectious disease   Poison Ivy Extract [poison Ivy Extract] Rash   Poison  Oak Extract Rash        Medication List     STOP taking these medications    cetirizine 5 MG tablet Commonly known as: ZYRTEC       TAKE these medications    albuterol  108 (90 Base) MCG/ACT inhaler Commonly known as: VENTOLIN  HFA Inhale 2 puffs into the lungs every 6 (six) hours as needed for wheezing or shortness of breath.   apixaban  5 MG Tabs tablet Commonly known as: ELIQUIS  Take 1 tablet (5 mg total) by mouth 2 (two) times daily.   atorvastatin  80 MG tablet Commonly known as: LIPITOR  Take 40 mg by mouth at bedtime.   cholecalciferol  1000 units tablet Commonly known as: VITAMIN D  Take 1,000 Units by mouth every morning.   ferrous sulfate  325 (65 FE) MG tablet Take 325 mg by mouth 3 (three) times a week.   fluocinonide 0.05 % external solution Commonly known as: LIDEX Apply 1 Application topically daily as needed (itchy rash on scalp).   Fluticasone -Salmeterol 250-50 MCG/DOSE Aepb Commonly known as: ADVAIR Inhale 1 puff into the lungs daily.   furosemide  20 MG tablet Commonly known as: LASIX  Take 1 tablet (20 mg total) by mouth daily.   gabapentin  100 MG capsule Commonly known as: NEURONTIN  Take 100 mg by mouth 2 (two) times daily.   Glucagon Emergency 1 MG Kit Inject 1 mg into the skin as needed (low bs).   Hydrocortisone  2 % Crea Apply 1 Application topically 2 (two) times daily as needed (rash on face).   insulin  regular human CONCENTRATED 500 UNIT/ML KwikPen Commonly known as: HUMULIN R  Inject 50-100 Units into the skin See admin instructions. Inject 30 units subcutaneously with breakfast, 30 units subcutaneously with lunch and 30 units subcutaneously with dinner. What changed: additional instructions   ipratropium 0.02 % nebulizer solution Commonly known as: ATROVENT  Take 2.5 mLs (0.5 mg total) by nebulization 2 (two) times daily.   ketoconazole  2 % cream Commonly known as: NIZORAL  Apply 1 Application topically in the morning and at  bedtime.   ketoconazole  2 % shampoo Commonly known as: NIZORAL  Apply 1 Application topically 3 (three) times a week.   levalbuterol  0.63 MG/3ML nebulizer solution Commonly known as: XOPENEX  Take 3 mLs (0.63 mg total) by nebulization 2 (two) times daily.   Mounjaro 12.5 MG/0.5ML Pen Generic drug: tirzepatide Inject 12.5 mg into the skin once a week.   OXYGEN Inhale 2-3 L/min into the lungs See admin instructions. 2 L/min continuous. May increase to 3 L/min as needed for shortness of breath.   rOPINIRole  1 MG tablet Commonly known as: REQUIP  Take 1 mg by mouth at bedtime.   Spiriva  Respimat 2.5 MCG/ACT Aers Generic drug: Tiotropium Bromide  Monohydrate Inhale 2 each into the lungs daily as needed (wheezing, shortness of breath.).   tamsulosin  0.4 MG Caps capsule Commonly known as: FLOMAX  Take 0.4 mg by mouth at bedtime.               Discharge Care Instructions  (From admission, onward)  Start     Ordered   04/09/24 0000  Discharge wound care:       Comments: Pressure Injury 06/24/23 Coccyx Mid Stage 2 -  Partial thickness loss of dermis presenting as a shallow open injury with a red, pink wound bed without slough. Dark red area, tiny open area in center   04/09/24 0849            Contact information for follow-up providers     [place], [facility]  Follow up.   Contact information: [address] [place] [address] [phone]              Contact information for after-discharge care     Destination     [facility] Preferred SNF .   Service: Skilled Nursing Contact information: [address] [place]  [address] [phone]                    Discharge Exam: Filed Weights   04/07/24 0326 04/08/24 0652 04/09/24 0413  Weight: 115.8 kg 118 kg 124.9 kg        General:  AAO x 3,  cooperative, no distress;   HEENT:  Normocephalic, PERRL, otherwise with in Normal  limits   Neuro:  CNII-XII intact. , normal motor and sensation, reflexes intact   Lungs:   Clear to auscultation BL, Respirations unlabored,  No wheezes / crackles  Cardio:    S1/S2, RRR, No murmure, No Rubs or Gallops   Abdomen:  Soft, non-tender, bowel sounds active all four quadrants, no guarding or peritoneal signs.  Muscular  skeletal:  Limited exam -global generalized weaknesses - in bed, able to move all 4 extremities,   2+ pulses,  symmetric, No pitting edema  Skin:  Dry, warm to touch, negative for any Rashes,  Wounds: Please see nursing documentation  Pressure Injury 06/24/23 Coccyx Mid Stage 2 -  Partial thickness loss of dermis presenting as a shallow open injury with a red, pink wound bed without slough. Dark red area, tiny open area in center (Active)  06/24/23 1842  Location: Coccyx  Location Orientation: Mid  Staging: Stage 2 -  Partial thickness loss of dermis presenting as a shallow open injury with a red, pink wound bed without slough.  Wound Description (Comments): Dark red area, tiny open area in center  Present on Admission:   Dressing Type Foam - Lift dressing to assess site every shift 04/08/24 1135          Condition at discharge: fair  The results of significant diagnostics from this hospitalization (including imaging, microbiology, ancillary and laboratory) are listed below for reference.   Imaging Studies: DG CHEST PORT 1 VIEW Result Date: 04/05/2024 CLINICAL DATA:  Shortness of breath. EXAM: PORTABLE CHEST 1 VIEW COMPARISON:  04/03/2024. FINDINGS: Patient is rotated to the right. Low lung volumes. Stable cardiomediastinal silhouette. Pulmonary vascular congestion with similar bilateral interstitial prominence, which could reflect interstitial edema. No sizable pleural effusion. No focal consolidation. No pneumothorax. No acute osseous abnormality. IMPRESSION: Cardiomegaly with pulmonary vascular congestion and probable interstitial edema, not  significantly changed since the prior exam. Electronically Signed   By: Mannie Seek M.D.   On: 04/05/2024 10:45   CT RENAL STONE STUDY Result Date: 04/03/2024 CLINICAL DATA:  Sepsis pain kidney stones EXAM: CT ABDOMEN AND PELVIS WITHOUT CONTRAST TECHNIQUE: Multidetector CT imaging of the abdomen and pelvis was performed following the standard protocol without IV contrast. RADIATION DOSE REDUCTION: This  exam was performed according to the departmental dose-optimization program which includes automated exposure control, adjustment of the mA and/or kV according to patient size and/or use of iterative reconstruction technique. COMPARISON:  None Available. FINDINGS: Lower chest: Right lower lobe atelectasis right pleural reaction, no significant pleural effusions Hepatobiliary: Liver normal. No parenchymal lesion no biliary dilatation. 5 mm gallstone neck of the gallbladder without gallbladder inflammatory changes Pancreas: Unremarkable. No pancreatic ductal dilatation or surrounding inflammatory changes. Spleen: Normal in size without focal abnormality. Adrenals/Urinary Tract: Adrenal glands are normal Right kidney normal. Left kidney is absent. Correlate clinically. Bladder demonstrates diffuse bladder wall thickening with a bubble of air in the bladder that could correlate with recent instrumentation. Findings could correlate with chronic bladder outlet obstruction. Prostate gland normal size. Surgical clips in the left renal fossa correlate with prior nephrectomy. Stomach/Bowel: Stomach is within normal limits. Appendix appears normal. No evidence of bowel wall thickening, distention, or inflammatory changes. No appendicitis, no diverticulitis Fecal impaction rectum without obstruction. Vascular/Lymphatic: Aortic atherosclerosis. No enlarged abdominal or pelvic lymph nodes. Reproductive: Prostate is unremarkable. Other: No abdominal wall hernia or abnormality. No abdominopelvic ascites. Musculoskeletal:  Multilevel degenerative disc disease lumbosacral spine. No fractures. IMPRESSION: *Right lower lobe atelectasis and right pleural reaction. *Cholelithiasis without gallbladder inflammatory changes. *Left kidney is absent. *Diffuse bladder wall thickening with a bubble of air in the bladder that could correlate with recent instrumentation. Findings could correlate with chronic bladder outlet obstruction. *Fecal impaction rectum without obstruction. *Aortic atherosclerosis. Electronically Signed   By: Fredrich Jefferson M.D.   On: 04/03/2024 17:19   DG Chest Port 1 View Result Date: 04/03/2024 CLINICAL DATA:  Weakness. EXAM: PORTABLE CHEST 1 VIEW COMPARISON:  06/19/2023 FINDINGS: Lung volumes are low. Stable cardiomegaly. Unchanged mediastinal contours. Vascular congestion. Interstitial accentuation may represent pulmonary edema. No significant pleural effusion. No focal airspace disease. No pneumothorax. IMPRESSION: Cardiomegaly with vascular congestion and possible pulmonary edema. Electronically Signed   By: Chadwick Colonel M.D.   On: 04/03/2024 12:01    Microbiology: Results for orders placed or performed during the hospital encounter of 04/03/24  Culture, blood (routine x 2)     Status: None   Collection Time: 04/03/24 12:00 PM   Specimen: BLOOD  Result Value Ref Range Status   Specimen Description BLOOD LEFT ARM  Final   Special Requests   Final    BOTTLES DRAWN AEROBIC AND ANAEROBIC Blood Culture results may not be optimal due to an inadequate volume of blood received in culture bottles   Culture   Final    NO GROWTH 5 DAYS Performed at [facility], [address] [place], [place] [address]    Report Status 04/08/2024 FINAL  Final  Resp panel by RT-PCR (RSV, Flu A&B, Covid) Anterior Nasal Swab     Status: None   Collection Time: 04/03/24 12:00 PM   Specimen: Anterior Nasal Swab  Result Value Ref Range Status   SARS Coronavirus 2 by RT PCR NEGATIVE NEGATIVE Final    Comment:  (NOTE) SARS-CoV-2 target nucleic acids are NOT DETECTED.  The SARS-CoV-2 RNA is generally detectable in upper respiratory specimens during the acute phase of infection. The lowest concentration of SARS-CoV-2 viral copies this assay can detect is 138 copies/mL. A negative result does not preclude SARS-Cov-2 infection and should not be used as the sole basis for treatment or other patient management decisions. A negative result may occur with  improper specimen collection/handling, submission of specimen other than nasopharyngeal swab, presence of viral mutation(s)  within the areas targeted by this assay, and inadequate number of viral copies(<138 copies/mL). A negative result must be combined with clinical observations, patient history, and epidemiological information. The expected result is Negative.  Fact Sheet for Patients:  BloggerCourse.com  Fact Sheet for Healthcare Providers:  SeriousBroker.it  This test is no t yet approved or cleared by the [place]  FDA and  has been authorized for detection and/or diagnosis of SARS-CoV-2 by FDA under an Emergency Use Authorization (EUA). This EUA will remain  in effect (meaning this test can be used) for the duration of the COVID-19 declaration under Section 564(b)(1) of the Act, 21 U.S.C.section 360bbb-3(b)(1), unless the authorization is terminated  or revoked sooner.       Influenza A by PCR NEGATIVE NEGATIVE Final   Influenza B by PCR NEGATIVE NEGATIVE Final    Comment: (NOTE) The Xpert Xpress SARS-CoV-2/FLU/RSV plus assay is intended as an aid in the diagnosis of influenza from Nasopharyngeal swab specimens and should not be used as a sole basis for treatment. Nasal washings and aspirates are unacceptable for Xpert Xpress SARS-CoV-2/FLU/RSV testing.  Fact Sheet for Patients: BloggerCourse.com  Fact Sheet for Healthcare  Providers: SeriousBroker.it  This test is not yet approved or cleared by the [place]  FDA and has been authorized for detection and/or diagnosis of SARS-CoV-2 by FDA under an Emergency Use Authorization (EUA). This EUA will remain in effect (meaning this test can be used) for the duration of the COVID-19 declaration under Section 564(b)(1) of the Act, 21 U.S.C. section 360bbb-3(b)(1), unless the authorization is terminated or revoked.     Resp Syncytial Virus by PCR NEGATIVE NEGATIVE Final    Comment: (NOTE) Fact Sheet for Patients: BloggerCourse.com  Fact Sheet for Healthcare Providers: SeriousBroker.it  This test is not yet approved or cleared by the [place]  FDA and has been authorized for detection and/or diagnosis of SARS-CoV-2 by FDA under an Emergency Use Authorization (EUA). This EUA will remain in effect (meaning this test can be used) for the duration of the COVID-19 declaration under Section 564(b)(1) of the Act, 21 U.S.C. section 360bbb-3(b)(1), unless the authorization is terminated or revoked.  Performed at [facility], [address] [place], [place] [address]   Culture, blood (routine x 2)     Status: Abnormal   Collection Time: 04/03/24 12:05 PM   Specimen: BLOOD  Result Value Ref Range Status   Specimen Description   Final    BLOOD LEFT HAND Performed at [facility], [address] [place], [place] [address]    Special Requests   Final    BOTTLES DRAWN AEROBIC AND ANAEROBIC Blood Culture results may not be optimal due to an inadequate volume of blood received in culture bottles Performed at [facility], [address], [place], [place] [address]    Culture  Setup Time   Final    ANAEROBIC BOTTLE ONLY GRAM POSITIVE COCCI Gram Stain Report Called to,Read Back By and Verified With: L IRVING AT 0650 04/04/24 BY A WILSON CRITICAL RESULT CALLED TO, READ BACK BY AND VERIFIED  WITH: PHARMD STEVEN H on [id number] @1225  by SM    Culture (A)  Final    STAPHYLOCOCCUS HOMINIS THE SIGNIFICANCE OF ISOLATING THIS ORGANISM FROM A SINGLE SET OF BLOOD CULTURES WHEN MULTIPLE SETS ARE DRAWN IS UNCERTAIN. PLEASE NOTIFY THE MICROBIOLOGY DEPARTMENT WITHIN ONE WEEK IF SPECIATION AND SENSITIVITIES ARE REQUIRED. Performed at [facility], [address] [place], [place] [address]    Report Status 04/06/2024  FINAL  Final  Blood Culture ID Panel (Reflexed)     Status: Abnormal   Collection Time: 04/03/24 12:05 PM  Result Value Ref Range Status   Enterococcus faecalis NOT DETECTED NOT DETECTED Final   Enterococcus Faecium NOT DETECTED NOT DETECTED Final   Listeria monocytogenes NOT DETECTED NOT DETECTED Final   Staphylococcus species DETECTED (A) NOT DETECTED Final    Comment: CRITICAL RESULT CALLED TO, READ BACK BY AND VERIFIED WITH: PHARMD STEVEN H on [id number] @1225  by SM    Staphylococcus aureus (BCID) NOT DETECTED NOT DETECTED Final   Staphylococcus epidermidis NOT DETECTED NOT DETECTED Final   Staphylococcus lugdunensis NOT DETECTED NOT DETECTED Final   Streptococcus species NOT DETECTED NOT DETECTED Final   Streptococcus agalactiae NOT DETECTED NOT DETECTED Final   Streptococcus pneumoniae NOT DETECTED NOT DETECTED Final   Streptococcus pyogenes NOT DETECTED NOT DETECTED Final   A.calcoaceticus-baumannii NOT DETECTED NOT DETECTED Final   Bacteroides fragilis NOT DETECTED NOT DETECTED Final   Enterobacterales NOT DETECTED NOT DETECTED Final   Enterobacter cloacae complex NOT DETECTED NOT DETECTED Final   Escherichia coli NOT DETECTED NOT DETECTED Final   Klebsiella aerogenes NOT DETECTED NOT DETECTED Final   Klebsiella oxytoca NOT DETECTED NOT DETECTED Final   Klebsiella pneumoniae NOT DETECTED NOT DETECTED Final   Proteus species NOT DETECTED NOT DETECTED Final   Salmonella species NOT DETECTED NOT DETECTED Final   Serratia marcescens NOT DETECTED NOT DETECTED Final    Haemophilus influenzae NOT DETECTED NOT DETECTED Final   Neisseria meningitidis NOT DETECTED NOT DETECTED Final   Pseudomonas aeruginosa NOT DETECTED NOT DETECTED Final   Stenotrophomonas maltophilia NOT DETECTED NOT DETECTED Final   Candida albicans NOT DETECTED NOT DETECTED Final   Candida auris NOT DETECTED NOT DETECTED Final   Candida glabrata NOT DETECTED NOT DETECTED Final   Candida krusei NOT DETECTED NOT DETECTED Final   Candida parapsilosis NOT DETECTED NOT DETECTED Final   Candida tropicalis NOT DETECTED NOT DETECTED Final   Cryptococcus neoformans/gattii NOT DETECTED NOT DETECTED Final    Comment: Performed at [facility], [address] [place], [place] [address]  Urine Culture     Status: Abnormal   Collection Time: 04/03/24  2:00 PM   Specimen: Urine, Clean Catch  Result Value Ref Range Status   Specimen Description   Final    URINE, CLEAN CATCH Performed at [facility], [address], [place], [place] [address]    Special Requests   Final    NONE Performed at [facility], [address], [place], [place] [address]    Culture 80,000 COLONIES/mL ENTEROCOCCUS FAECALIS (A)  Final   Report Status 04/05/2024 FINAL  Final   Organism ID, Bacteria ENTEROCOCCUS FAECALIS (A)  Final      Susceptibility   Enterococcus faecalis - MIC*    AMPICILLIN  <=2 SENSITIVE Sensitive     NITROFURANTOIN <=16 SENSITIVE Sensitive     VANCOMYCIN 1 SENSITIVE Sensitive     * 80,000 COLONIES/mL ENTEROCOCCUS FAECALIS  Culture, blood (Routine X 2) w Reflex to ID Panel     Status: None (Preliminary result)   Collection Time: 04/05/24  1:39 PM   Specimen: BLOOD  Result Value Ref Range Status   Specimen Description BLOOD RIGHT ANTECUBITAL  Final   Special Requests   Final    BOTTLES DRAWN AEROBIC AND ANAEROBIC Blood Culture adequate volume   Culture   Final    NO GROWTH 4 DAYS Performed at [facility], [address], [place], [place] [address]    Report Status PENDING  Incomplete   Culture, blood (Routine X 2) w Reflex to ID Panel     Status: None (Preliminary result)   Collection Time: 04/05/24  1:40 PM   Specimen: BLOOD RIGHT HAND  Result Value Ref Range Status   Specimen Description BLOOD RIGHT HAND  Final   Special Requests   Final    BOTTLES DRAWN AEROBIC ONLY Blood Culture results may not be optimal due to an inadequate volume of blood received in culture bottles   Culture   Final    NO GROWTH 4 DAYS Performed at [facility], [address], [place], [place] [address]    Report Status PENDING  Incomplete    Labs: CBC: Recent Labs  Lab 04/03/24 1200 04/04/24 0401 04/05/24 0609 04/06/24 0540 04/07/24 0626 04/08/24 0652  WBC 6.4 4.0 4.6 2.9* 3.9* 4.2  NEUTROABS 5.5  --   --   --   --   --   HGB 12.0* 11.5* 11.7* 10.7* 10.7* 10.2*  HCT 41.3 40.3 40.8 37.6* 36.2* 35.5*  MCV 95.8 96.0 96.9 98.7 97.8 96.2  PLT 117* 122* 124* 109* 128* 137*   Basic Metabolic Panel: Recent Labs  Lab 04/03/24 1335 04/04/24 0401 04/05/24 0609 04/06/24 0540 04/08/24 0559 04/09/24 0448  NA  --  138 139 139 140 142  K  --  5.3* 5.2* 4.9 5.2* 5.0  CL  --  101 102 103 106 106  CO2  --  27 30 26 24 29   GLUCOSE  --  189* 106* 238* 101* 76  BUN  --  56* 64* 67* 72* 71*  CREATININE  --  2.70* 2.66* 2.82* 3.26* 3.00*  CALCIUM   --  8.1* 8.2* 8.2* 8.7* 8.8*  MG 1.9  --   --   --   --   --   PHOS 5.5*  --   --   --   --   --    Liver Function Tests: Recent Labs  Lab 04/03/24 1200 04/04/24 0401 04/05/24 0609 04/06/24 0540  AST 21 17 17  13*  ALT 18 17 17 13   ALKPHOS 75 66 73 65  BILITOT 1.3* 0.5 0.3 0.4  PROT 6.8 6.3* 6.4* 5.9*  ALBUMIN 2.6* 2.4* 2.5* 2.3*   CBG: Recent Labs  Lab 04/08/24 1133 04/08/24 1639 04/08/24 2229 04/09/24 0743 04/09/24 1127  GLUCAP 174* 156* 81 90 199*    Discharge time spent: greater than 40 minutes.  Signed: Bobbetta Burnet, MD [facility] 04/09/2024

## 2024-04-09 NOTE — Care Management Important Message (Signed)
 Important Message  Patient Details  Name: Ricky RUDIGER Sr. MRN: [id number] Date of Birth: 26-Sep-1951   Important Message Given:  Yes - Medicare IM     Jamelyn Bovard L Kamarah Bilotta 04/09/2024, 8:54 AM

## 2024-04-09 NOTE — TOC Transition Note (Signed)
 Transition of Care [facility]) - Discharge Note   Patient Details  Name: Ricky KEENAN Sr. MRN: [id number] Date of Birth: 1951/07/16  [facility] CM/SW Contact:  Ander Katos, LCSW Phone Number: 04/09/2024, 12:26 PM   Clinical Narrative:  Pt's wife accepts bed offer at [facility]. Facility notified and can accept today. Wife aware and agreeable. Will transfer with staff. RN given number to call report. Will send d/c summary when completed.      Final next level of care: Skilled Nursing Facility Barriers to Discharge: Barriers Resolved   Patient Goals and CMS Choice Patient states their goals for this hospitalization and ongoing recovery are:: Strengthening CMS Medicare.gov Compare Post Acute Care list provided to:: Patient Represenative (must comment) Ricky Johnston (wife)) Choice offered to / list presented to : Spouse [place] ownership interest in [facility].provided to:: Spouse    Discharge Placement              Patient chooses bed at: [facility] Patient to be transferred to facility by: staff Name of family member notified: wife Patient and family notified of of transfer: 04/09/24  Discharge Plan and Services Additional resources added to the After Visit Summary for   In-house Referral: Clinical Social Work Discharge Planning Services: CM Consult Post Acute Care Choice: Skilled Nursing Facility                    HH Arranged: PT [facility] Agency: [facility]        Social Drivers of Health (SDOH) Interventions SDOH Screenings   Food Insecurity: Low Risk  (01/04/2024)   Received from [facility]  Housing: Low Risk  (04/03/2024)  Transportation Needs: No Transportation Needs (01/04/2024)   Received from [facility]  Utilities: Not At Risk (04/03/2024)  Depression (PHQ2-9): Low Risk  (11/08/2021)  Tobacco Use: Medium Risk (04/04/2024)     Readmission Risk Interventions    04/03/2024    8:38 PM   Readmission Risk Prevention Plan  Transportation Screening Complete  Medication Review (RN Care Manager) Complete  PCP or Specialist appointment within 3-5 days of discharge Complete  HRI or Home Care Consult Complete  SW Recovery Care/Counseling Consult Complete  Palliative Care Screening Not Applicable  Skilled Nursing Facility Complete

## 2024-04-09 NOTE — Progress Notes (Signed)
 Report called to Camilo Cella at [facility].

## 2024-04-10 ENCOUNTER — Encounter (HOSPITAL_COMMUNITY): Payer: Self-pay

## 2024-04-10 LAB — CULTURE, BLOOD (ROUTINE X 2)
Culture: NO GROWTH
Culture: NO GROWTH
Special Requests: ADEQUATE

## 2024-04-10 NOTE — Plan of Care (Signed)
 Transfer from [facility]. Ricky Johnston is a 73 year old male with past medical history significant for HTN, HLD, chronic systolic CHF, CAD, COPD, with chronically dependent 2L O2, OSA, on CPAP, DM2, GERD, anemia of chronic disease, morbid obesity, cardiac, bladder cancer under treatment who presented after being noted to be unresponsive.  Recently was discharged from any [facility] for sepsis thought possibly secondary to urinary tract infection on 5/13.  Initial workup revealed patient to be hypercapnic with pH 7.23 with pCO2 75,.  Patient was placed on BiPAP with some improvement in mentation.  Initial chest x-ray noted concern for CHF and/or pneumonia.  proBNP noted to be greater than 3200 but blood pressures were noted to be soft at 108/70.  Patient was given Lasix  20 mg IV with reports of diuresis of 200 cc of urine so far.  Patient had also been given empiric antibiotics of Zosyn given possible concern for infection.  Family requesting transfer to [facility] due to patient's providers being present here.  Accepted to a progressive bed as inpatient.

## 2024-04-11 ENCOUNTER — Encounter (HOSPITAL_COMMUNITY): Payer: Self-pay | Admitting: Internal Medicine

## 2024-04-11 ENCOUNTER — Inpatient Hospital Stay (HOSPITAL_COMMUNITY)

## 2024-04-11 ENCOUNTER — Inpatient Hospital Stay (HOSPITAL_COMMUNITY)
Admission: EM | Admit: 2024-04-11 | Discharge: 2024-04-17 | DRG: 189 | Disposition: A | Discharge status: Skilled Nursing Facility | Source: Other Acute Inpatient Hospital | Attending: Internal Medicine | Admitting: Internal Medicine

## 2024-04-11 DIAGNOSIS — E8729 Other acidosis: Secondary | ICD-10-CM | POA: Diagnosis present

## 2024-04-11 DIAGNOSIS — E1169 Type 2 diabetes mellitus with other specified complication: Secondary | ICD-10-CM | POA: Diagnosis present

## 2024-04-11 DIAGNOSIS — Z9981 Dependence on supplemental oxygen: Secondary | ICD-10-CM | POA: Diagnosis not present

## 2024-04-11 DIAGNOSIS — E1122 Type 2 diabetes mellitus with diabetic chronic kidney disease: Secondary | ICD-10-CM | POA: Diagnosis present

## 2024-04-11 DIAGNOSIS — J440 Chronic obstructive pulmonary disease with acute lower respiratory infection: Secondary | ICD-10-CM | POA: Diagnosis present

## 2024-04-11 DIAGNOSIS — Z86718 Personal history of other venous thrombosis and embolism: Secondary | ICD-10-CM

## 2024-04-11 DIAGNOSIS — J9601 Acute respiratory failure with hypoxia: Secondary | ICD-10-CM | POA: Diagnosis not present

## 2024-04-11 DIAGNOSIS — I130 Hypertensive heart and chronic kidney disease with heart failure and stage 1 through stage 4 chronic kidney disease, or unspecified chronic kidney disease: Secondary | ICD-10-CM | POA: Diagnosis present

## 2024-04-11 DIAGNOSIS — J9621 Acute and chronic respiratory failure with hypoxia: Secondary | ICD-10-CM | POA: Diagnosis present

## 2024-04-11 DIAGNOSIS — D631 Anemia in chronic kidney disease: Secondary | ICD-10-CM | POA: Diagnosis present

## 2024-04-11 DIAGNOSIS — I159 Secondary hypertension, unspecified: Secondary | ICD-10-CM | POA: Diagnosis not present

## 2024-04-11 DIAGNOSIS — Z888 Allergy status to other drugs, medicaments and biological substances status: Secondary | ICD-10-CM

## 2024-04-11 DIAGNOSIS — I5022 Chronic systolic (congestive) heart failure: Secondary | ICD-10-CM | POA: Diagnosis present

## 2024-04-11 DIAGNOSIS — H9190 Unspecified hearing loss, unspecified ear: Secondary | ICD-10-CM | POA: Diagnosis present

## 2024-04-11 DIAGNOSIS — I4891 Unspecified atrial fibrillation: Secondary | ICD-10-CM | POA: Diagnosis present

## 2024-04-11 DIAGNOSIS — N179 Acute kidney failure, unspecified: Secondary | ICD-10-CM | POA: Diagnosis present

## 2024-04-11 DIAGNOSIS — C679 Malignant neoplasm of bladder, unspecified: Secondary | ICD-10-CM | POA: Diagnosis present

## 2024-04-11 DIAGNOSIS — E66812 Obesity, class 2: Secondary | ICD-10-CM | POA: Diagnosis present

## 2024-04-11 DIAGNOSIS — I10 Essential (primary) hypertension: Secondary | ICD-10-CM | POA: Diagnosis present

## 2024-04-11 DIAGNOSIS — Z91048 Other nonmedicinal substance allergy status: Secondary | ICD-10-CM

## 2024-04-11 DIAGNOSIS — I959 Hypotension, unspecified: Secondary | ICD-10-CM | POA: Diagnosis not present

## 2024-04-11 DIAGNOSIS — D630 Anemia in neoplastic disease: Secondary | ICD-10-CM | POA: Diagnosis present

## 2024-04-11 DIAGNOSIS — Z79899 Other long term (current) drug therapy: Secondary | ICD-10-CM

## 2024-04-11 DIAGNOSIS — Z87891 Personal history of nicotine dependence: Secondary | ICD-10-CM

## 2024-04-11 DIAGNOSIS — E7800 Pure hypercholesterolemia, unspecified: Secondary | ICD-10-CM | POA: Diagnosis present

## 2024-04-11 DIAGNOSIS — G9341 Metabolic encephalopathy: Secondary | ICD-10-CM | POA: Diagnosis present

## 2024-04-11 DIAGNOSIS — Z794 Long term (current) use of insulin: Secondary | ICD-10-CM | POA: Diagnosis not present

## 2024-04-11 DIAGNOSIS — N1831 Chronic kidney disease, stage 3a: Secondary | ICD-10-CM | POA: Diagnosis present

## 2024-04-11 DIAGNOSIS — N400 Enlarged prostate without lower urinary tract symptoms: Secondary | ICD-10-CM | POA: Diagnosis present

## 2024-04-11 DIAGNOSIS — R404 Transient alteration of awareness: Principal | ICD-10-CM

## 2024-04-11 DIAGNOSIS — E1140 Type 2 diabetes mellitus with diabetic neuropathy, unspecified: Secondary | ICD-10-CM | POA: Diagnosis present

## 2024-04-11 DIAGNOSIS — J438 Other emphysema: Secondary | ICD-10-CM | POA: Diagnosis not present

## 2024-04-11 DIAGNOSIS — Z7985 Long-term (current) use of injectable non-insulin antidiabetic drugs: Secondary | ICD-10-CM

## 2024-04-11 DIAGNOSIS — I5023 Acute on chronic systolic (congestive) heart failure: Secondary | ICD-10-CM | POA: Diagnosis present

## 2024-04-11 DIAGNOSIS — I480 Paroxysmal atrial fibrillation: Secondary | ICD-10-CM | POA: Diagnosis present

## 2024-04-11 DIAGNOSIS — R197 Diarrhea, unspecified: Secondary | ICD-10-CM | POA: Diagnosis present

## 2024-04-11 DIAGNOSIS — J189 Pneumonia, unspecified organism: Secondary | ICD-10-CM | POA: Diagnosis present

## 2024-04-11 DIAGNOSIS — J9622 Acute and chronic respiratory failure with hypercapnia: Secondary | ICD-10-CM | POA: Diagnosis present

## 2024-04-11 DIAGNOSIS — I2510 Atherosclerotic heart disease of native coronary artery without angina pectoris: Secondary | ICD-10-CM | POA: Diagnosis present

## 2024-04-11 DIAGNOSIS — D649 Anemia, unspecified: Secondary | ICD-10-CM | POA: Diagnosis present

## 2024-04-11 DIAGNOSIS — Z6838 Body mass index (BMI) 38.0-38.9, adult: Secondary | ICD-10-CM

## 2024-04-11 DIAGNOSIS — R55 Syncope and collapse: Secondary | ICD-10-CM | POA: Diagnosis not present

## 2024-04-11 DIAGNOSIS — Z905 Acquired absence of kidney: Secondary | ICD-10-CM

## 2024-04-11 DIAGNOSIS — R4182 Altered mental status, unspecified: Secondary | ICD-10-CM | POA: Diagnosis not present

## 2024-04-11 DIAGNOSIS — J8410 Pulmonary fibrosis, unspecified: Secondary | ICD-10-CM | POA: Diagnosis present

## 2024-04-11 DIAGNOSIS — R531 Weakness: Secondary | ICD-10-CM | POA: Diagnosis not present

## 2024-04-11 DIAGNOSIS — Z96642 Presence of left artificial hip joint: Secondary | ICD-10-CM | POA: Diagnosis present

## 2024-04-11 DIAGNOSIS — Z91199 Patient's noncompliance with other medical treatment and regimen due to unspecified reason: Secondary | ICD-10-CM

## 2024-04-11 DIAGNOSIS — J449 Chronic obstructive pulmonary disease, unspecified: Secondary | ICD-10-CM | POA: Diagnosis present

## 2024-04-11 DIAGNOSIS — Z7401 Bed confinement status: Secondary | ICD-10-CM | POA: Diagnosis not present

## 2024-04-11 DIAGNOSIS — Z7951 Long term (current) use of inhaled steroids: Secondary | ICD-10-CM

## 2024-04-11 DIAGNOSIS — G4733 Obstructive sleep apnea (adult) (pediatric): Secondary | ICD-10-CM | POA: Diagnosis present

## 2024-04-11 DIAGNOSIS — Z7901 Long term (current) use of anticoagulants: Secondary | ICD-10-CM

## 2024-04-11 DIAGNOSIS — G2581 Restless legs syndrome: Secondary | ICD-10-CM | POA: Diagnosis present

## 2024-04-11 DIAGNOSIS — R569 Unspecified convulsions: Secondary | ICD-10-CM | POA: Diagnosis not present

## 2024-04-11 DIAGNOSIS — J9602 Acute respiratory failure with hypercapnia: Secondary | ICD-10-CM | POA: Diagnosis not present

## 2024-04-11 LAB — CBC WITH DIFFERENTIAL/PLATELET
Abs Immature Granulocytes: 0.05 10*3/uL (ref 0.00–0.07)
Basophils Absolute: 0.1 10*3/uL (ref 0.0–0.1)
Basophils Relative: 1 %
Eosinophils Absolute: 0.1 10*3/uL (ref 0.0–0.5)
Eosinophils Relative: 2 %
HCT: 35.3 % — ABNORMAL LOW (ref 39.0–52.0)
Hemoglobin: 10.8 g/dL — ABNORMAL LOW (ref 13.0–17.0)
Immature Granulocytes: 1 %
Lymphocytes Relative: 8 %
Lymphs Abs: 0.5 10*3/uL — ABNORMAL LOW (ref 0.7–4.0)
MCH: 28.4 pg (ref 26.0–34.0)
MCHC: 30.6 g/dL (ref 30.0–36.0)
MCV: 92.9 fL (ref 80.0–100.0)
Monocytes Absolute: 0.5 10*3/uL (ref 0.1–1.0)
Monocytes Relative: 10 %
Neutro Abs: 4.5 10*3/uL (ref 1.7–7.7)
Neutrophils Relative %: 78 %
Platelets: 178 10*3/uL (ref 150–400)
RBC: 3.8 MIL/uL — ABNORMAL LOW (ref 4.22–5.81)
RDW: 14.1 % (ref 11.5–15.5)
WBC: 5.7 10*3/uL (ref 4.0–10.5)
nRBC: 0 % (ref 0.0–0.2)

## 2024-04-11 LAB — COMPREHENSIVE METABOLIC PANEL WITH GFR
ALT: 14 U/L (ref 0–44)
AST: 19 U/L (ref 15–41)
Albumin: 2.3 g/dL — ABNORMAL LOW (ref 3.5–5.0)
Alkaline Phosphatase: 62 U/L (ref 38–126)
Anion gap: 9 (ref 5–15)
BUN: 65 mg/dL — ABNORMAL HIGH (ref 8–23)
CO2: 28 mmol/L (ref 22–32)
Calcium: 8.6 mg/dL — ABNORMAL LOW (ref 8.9–10.3)
Chloride: 101 mmol/L (ref 98–111)
Creatinine, Ser: 2.36 mg/dL — ABNORMAL HIGH (ref 0.61–1.24)
GFR, Estimated: 29 mL/min — ABNORMAL LOW (ref >60)
Glucose, Bld: 141 mg/dL — ABNORMAL HIGH (ref 70–99)
Potassium: 4.8 mmol/L (ref 3.5–5.1)
Sodium: 138 mmol/L (ref 135–145)
Total Bilirubin: 0.8 mg/dL (ref 0.0–1.2)
Total Protein: 6.6 g/dL (ref 6.5–8.1)

## 2024-04-11 LAB — TROPONIN I (HIGH SENSITIVITY): Troponin I (High Sensitivity): 23 ng/L — ABNORMAL HIGH (ref <18)

## 2024-04-11 NOTE — H&P (Signed)
 History and Physical    Ricky Johnston VHQ:[id number] DOB: 1951/01/19 DOA: 04/11/2024  Patient coming from: Patient was transferred from [facility].  Chief Complaint: Unresponsive episode.  HPI: Ricky GENERAL Sr. is a 73 y.o. male with history of COPD, atrial fibrillation, chronic HFrEF, obesity, sleep apnea, chronic kidney disease stage III, anemia who was recently admitted to [facility] and discharged on 04/09/2024 after being treated for sepsis secondary to UTI and patient is also being treated for bladder cancer was found unresponsive at the rehab and was brought to the ER at [facility].  He is not sure if patient was using his BiPAP at bedtime at the facility.  I am unable to reach patient's wife to get more history.  Most of the history is obtained from the chart and epic/Care Everywhere.  ED Course: At [facility] and initial ABG showed pH of 7.23 with PCO2 of 75.  Patient was was placed on BiPAP.  Chest x-ray showed some congestion and CT chest abdomen pelvis showed diffuse infiltrates concerning for pulmonary edema versus atypical infection.  Patient was given Lasix  20 mg IV and started on empiric antibiotics for pneumonia.  Repeat ABG showed pH of 7.36 pCO2 58 patient became more alert awake.  CT of the head shows nonspecific hypoattenuation in the left medial temporal lobe.  MRI brain was suggested.  Patient was transferred to [facility] at the request of family.  At the time of my exam patient is alert awake off BiPAP moving all extremities oriented to person and place.  Following commands.  Review of Systems: As per HPI, rest all negative.   Past Medical History:  Diagnosis Date   Arthritis    Cancer (HCC)    bladder cancer currently 2016   CHF (congestive heart failure) (HCC)    Complication of anesthesia    hard for him to wake up from Anesthesia from left nephrectomy   COPD (chronic obstructive pulmonary disease) (HCC)    Coronary artery  disease    Diabetes mellitus without complication (HCC)    DVT (deep venous thrombosis) (HCC) 01/08/2014, 2010   upper extremity   GERD (gastroesophageal reflux disease)    Hallux limitus 05/18/2015   from notes from [facility]    Headache    migraines   History of kidney stones    Hypercholesterolemia    Hypertension    Impaired hearing    Intervertebral disc syndrome    Kidney stones    Pulmonary fibrosis (HCC)    Renal calculi 01/08/2014   frrom noted from [facility] .in chart   Renal insufficiency 06/19/2023   Restless legs 01/08/2014   Sciatic leg pain    paralysis of sciatic nerve   Sleep apnea    CPAP/BIPAP   Tinnitus     Past Surgical History:  Procedure Laterality Date   ABDOMINAL SURGERY     BACK SURGERY     BLADDER REPAIR     EYE SURGERY     HERNIA REPAIR     LEFT HEART CATHETERIZATION WITH CORONARY ANGIOGRAM N/A 01/09/2015   Procedure: LEFT HEART CATHETERIZATION WITH CORONARY ANGIOGRAM;  Surgeon: Sharene Dauer, MD;  Location: [facility] CATH LAB;  Service: Cardiovascular;  Laterality: N/A;   NEPHROURETERECTOMY  03/18/2016   with bladder cuff excision- from noted in chart from [facility]   TOTAL HIP ARTHROPLASTY Left 02/10/2017   Procedure: LEFT TOTAL HIP ARTHROPLASTY ANTERIOR APPROACH;  Surgeon: Arnie Lao, MD;  Location: [facility] ORS;  Service: Orthopedics;  Laterality: Left;     reports that he quit smoking about 24 years ago. His smoking use included cigarettes. He started smoking about 57 years ago. He has a 33 pack-year smoking history. He has never used smokeless tobacco. He reports that he does not currently use alcohol. He reports that he does not use drugs.  Allergies  Allergen Reactions   Empagliflozin  Other (See Comments)    Urinary tract infectious disease   Poison Ivy Extract [Poison Ivy Extract] Rash   Poison Oak Extract Rash    History reviewed. No pertinent family history.  Prior to Admission medications   Medication Sig Start Date End Date  Taking? Authorizing Provider  albuterol  (PROVENTIL  HFA;VENTOLIN  HFA) 108 (90 Base) MCG/ACT inhaler Inhale 2 puffs into the lungs every 6 (six) hours as needed for wheezing or shortness of breath.    [provider]  apixaban  (ELIQUIS ) 5 MG TABS tablet Take 1 tablet (5 mg total) by mouth 2 (two) times daily. 06/25/23 04/03/24  Pokhrel, Laxman, MD  atorvastatin  (LIPITOR ) 80 MG tablet Take 40 mg by mouth at bedtime.    [provider]  cholecalciferol  (VITAMIN D ) 1000 UNITS tablet Take 1,000 Units by mouth every morning.    [provider]  ferrous sulfate  325 (65 FE) MG tablet Take 325 mg by mouth 3 (three) times a week.    [provider]  fluocinonide (LIDEX) 0.05 % external solution Apply 1 Application topically daily as needed (itchy rash on scalp).    [provider]  Fluticasone -Salmeterol (ADVAIR) 250-50 MCG/DOSE AEPB Inhale 1 puff into the lungs daily. 05/18/23   Singh, Prashant K, MD  furosemide  (LASIX ) 20 MG tablet Take 1 tablet (20 mg total) by mouth daily. 04/09/24   Shahmehdi, Constantino Demark, MD  gabapentin  (NEURONTIN ) 100 MG capsule Take 100 mg by mouth 2 (two) times daily.    [provider]  Glucagon, rDNA, (GLUCAGON EMERGENCY) 1 MG KIT Inject 1 mg into the skin as needed (low bs).    [provider]  Hydrocortisone  2 % CREA Apply 1 Application topically 2 (two) times daily as needed (rash on face).    [provider]  insulin  regular human CONCENTRATED (HUMULIN R ) 500 UNIT/ML KwikPen Inject 50-100 Units into the skin See admin instructions. Inject 30 units subcutaneously with breakfast, 30 units subcutaneously with lunch and 30 units subcutaneously with dinner. 04/09/24   Shahmehdi, Seyed A, MD  ipratropium (ATROVENT ) 0.02 % nebulizer solution Take 2.5 mLs (0.5 mg total) by nebulization 2 (two) times daily. 04/09/24   Shahmehdi, Constantino Demark, MD  ketoconazole  (NIZORAL ) 2 % cream Apply 1 Application topically in the morning and at  bedtime.    [provider]  ketoconazole  (NIZORAL ) 2 % shampoo Apply 1 Application topically 3 (three) times a week.    [provider]  levalbuterol  (XOPENEX ) 0.63 MG/3ML nebulizer solution Take 3 mLs (0.63 mg total) by nebulization 2 (two) times daily. 04/09/24   Shahmehdi, Seyed A, MD  OXYGEN Inhale 2-3 L/min into the lungs See admin instructions. 2 L/min continuous. May increase to 3 L/min as needed for shortness of breath.    [provider]  rOPINIRole  (REQUIP ) 1 MG tablet Take 1 mg by mouth at bedtime.    [provider]  tamsulosin  (FLOMAX ) 0.4 MG CAPS capsule Take 0.4 mg by mouth at bedtime.    [provider]  Tiotropium Bromide  Monohydrate (SPIRIVA  RESPIMAT) 2.5 MCG/ACT AERS Inhale 2  each into the lungs daily as needed (wheezing, shortness of breath.).    [provider]  tirzepatide Florence Hunt) 12.5 MG/0.5ML Pen Inject 12.5 mg into the skin once a week.    [provider]    Physical Exam: Constitutional: Moderately built and nourished. Vitals:   04/11/24 2125 04/11/24 2136 04/11/24 2200 04/11/24 2336  BP: 127/66 127/66  127/69  Pulse: 65 65 66   Resp: (!) 22 (!) 22 (!) 26   Temp: 98.9 F (37.2 C) 98.9 F (37.2 C)  97.7 F (36.5 C)  TempSrc: Oral Oral  Oral  SpO2: 92%  91% 91%  Weight:  115.1 kg    Height:  5\' 8"  (1.727 m)     Eyes: Anicteric no pallor. ENMT: No discharge from the ears eyes nose and mouth. Neck: No mass felt.  No neck rigidity. Respiratory: No rhonchi or crepitations. Cardiovascular: S1 S2 heard. Abdomen: Soft nontender bowel sound present. Musculoskeletal: No edema. Skin: No rash. Neurologic: Alert awake oriented to his name and place.  Moving all extremities. Psychiatric: Oriented to name and place.   Labs on Admission: I have personally reviewed following labs and imaging studies  CBC: Recent Labs  Lab 04/05/24 0609 04/06/24 0540 04/07/24 0626 04/08/24 0652 04/11/24 2132  WBC  4.6 2.9* 3.9* 4.2 5.7  NEUTROABS  --   --   --   --  4.5  HGB 11.7* 10.7* 10.7* 10.2* 10.8*  HCT 40.8 37.6* 36.2* 35.5* 35.3*  MCV 96.9 98.7 97.8 96.2 92.9  PLT 124* 109* 128* 137* 178   Basic Metabolic Panel: Recent Labs  Lab 04/05/24 0609 04/06/24 0540 04/08/24 0559 04/09/24 0448 04/11/24 2132  NA 139 139 140 142 138  K 5.2* 4.9 5.2* 5.0 4.8  CL 102 103 106 106 101  CO2 30 26 24 29 28   GLUCOSE 106* 238* 101* 76 141*  BUN 64* 67* 72* 71* 65*  CREATININE 2.66* 2.82* 3.26* 3.00* 2.36*  CALCIUM  8.2* 8.2* 8.7* 8.8* 8.6*   GFR: Estimated Creatinine Clearance: 34.9 mL/min (A) (by C-G formula based on SCr of 2.36 mg/dL (H)). Liver Function Tests: Recent Labs  Lab 04/05/24 0609 04/06/24 0540 04/11/24 2132  AST 17 13* 19  ALT 17 13 14   ALKPHOS 73 65 62  BILITOT 0.3 0.4 0.8  PROT 6.4* 5.9* 6.6  ALBUMIN 2.5* 2.3* 2.3*   No results for input(s): "LIPASE", "AMYLASE" in the last 168 hours. No results for input(s): "AMMONIA" in the last 168 hours. Coagulation Profile: No results for input(s): "INR", "PROTIME" in the last 168 hours. Cardiac Enzymes: No results for input(s): "CKTOTAL", "CKMB", "CKMBINDEX", "TROPONINI" in the last 168 hours. BNP (last 3 results) No results for input(s): "PROBNP" in the last 8760 hours. HbA1C: No results for input(s): "HGBA1C" in the last 72 hours. CBG: Recent Labs  Lab 04/08/24 1133 04/08/24 1639 04/08/24 2229 04/09/24 0743 04/09/24 1127  GLUCAP 174* 156* 81 90 199*   Lipid Profile: No results for input(s): "CHOL", "HDL", "LDLCALC", "TRIG", "CHOLHDL", "LDLDIRECT" in the last 72 hours. Thyroid Function Tests: No results for input(s): "TSH", "T4TOTAL", "FREET4", "T3FREE", "THYROIDAB" in the last 72 hours. Anemia Panel: No results for input(s): "VITAMINB12", "FOLATE", "FERRITIN", "TIBC", "IRON", "RETICCTPCT" in the last 72 hours. Urine analysis:    Component Value Date/Time   COLORURINE AMBER (A) 04/03/2024 1400   APPEARANCEUR CLOUDY  (A) 04/03/2024 1400   LABSPEC 1.014 04/03/2024 1400   PHURINE 5.0 04/03/2024 1400   GLUCOSEU 50 (A) 04/03/2024 1400  HGBUR LARGE (A) 04/03/2024 1400   BILIRUBINUR NEGATIVE 04/03/2024 1400   KETONESUR NEGATIVE 04/03/2024 1400   PROTEINUR 100 (A) 04/03/2024 1400   NITRITE NEGATIVE 04/03/2024 1400   LEUKOCYTESUR MODERATE (A) 04/03/2024 1400   Sepsis Labs: @LABRCNTIP (procalcitonin:4,lacticidven:4) ) Recent Results (from the past 240 hours)  Culture, blood (routine x 2)     Status: None   Collection Time: 04/03/24 12:00 PM   Specimen: BLOOD  Result Value Ref Range Status   Specimen Description BLOOD LEFT ARM  Final   Special Requests   Final    BOTTLES DRAWN AEROBIC AND ANAEROBIC Blood Culture results may not be optimal due to an inadequate volume of blood received in culture bottles   Culture   Final    NO GROWTH 5 DAYS Performed at [facility], [address], [place], [place] [address]    Report Status 04/08/2024 FINAL  Final  Resp panel by RT-PCR (RSV, Flu A&B, Covid) Anterior Nasal Swab     Status: None   Collection Time: 04/03/24 12:00 PM   Specimen: Anterior Nasal Swab  Result Value Ref Range Status   SARS Coronavirus 2 by RT PCR NEGATIVE NEGATIVE Final    Comment: (NOTE) SARS-CoV-2 target nucleic acids are NOT DETECTED.  The SARS-CoV-2 RNA is generally detectable in upper respiratory specimens during the acute phase of infection. The lowest concentration of SARS-CoV-2 viral copies this assay can detect is 138 copies/mL. A negative result does not preclude SARS-Cov-2 infection and should not be used as the sole basis for treatment or other patient management decisions. A negative result may occur with  improper specimen collection/handling, submission of specimen other than nasopharyngeal swab, presence of viral mutation(s) within the areas targeted by this assay, and inadequate number of viral copies(<138 copies/mL). A negative result must be combined with clinical  observations, patient history, and epidemiological information. The expected result is Negative.  Fact Sheet for Patients:  BloggerCourse.com  Fact Sheet for Healthcare Providers:  SeriousBroker.it  This test is no t yet approved or cleared by the [place]  FDA and  has been authorized for detection and/or diagnosis of SARS-CoV-2 by FDA under an Emergency Use Authorization (EUA). This EUA will remain  in effect (meaning this test can be used) for the duration of the COVID-19 declaration under Section 564(b)(1) of the Act, 21 U.S.C.section 360bbb-3(b)(1), unless the authorization is terminated  or revoked sooner.       Influenza A by PCR NEGATIVE NEGATIVE Final   Influenza B by PCR NEGATIVE NEGATIVE Final    Comment: (NOTE) The Xpert Xpress SARS-CoV-2/FLU/RSV plus assay is intended as an aid in the diagnosis of influenza from Nasopharyngeal swab specimens and should not be used as a sole basis for treatment. Nasal washings and aspirates are unacceptable for Xpert Xpress SARS-CoV-2/FLU/RSV testing.  Fact Sheet for Patients: BloggerCourse.com  Fact Sheet for Healthcare Providers: SeriousBroker.it  This test is not yet approved or cleared by the [place]  FDA and has been authorized for detection and/or diagnosis of SARS-CoV-2 by FDA under an Emergency Use Authorization (EUA). This EUA will remain in effect (meaning this test can be used) for the duration of the COVID-19 declaration under Section 564(b)(1) of the Act, 21 U.S.C. section 360bbb-3(b)(1), unless the authorization is terminated or revoked.     Resp Syncytial Virus by PCR NEGATIVE NEGATIVE Final    Comment: (NOTE) Fact Sheet for Patients: BloggerCourse.com  Fact Sheet for Healthcare Providers: SeriousBroker.it  This test is not yet approved or cleared by  the [place]  FDA and has been authorized for detection and/or diagnosis of SARS-CoV-2 by FDA under an Emergency Use Authorization (EUA). This EUA will remain in effect (meaning this test can be used) for the duration of the COVID-19 declaration under Section 564(b)(1) of the Act, 21 U.S.C. section 360bbb-3(b)(1), unless the authorization is terminated or revoked.  Performed at [facility], [address], [place], [place] [address]   Culture, blood (routine x 2)     Status: Abnormal   Collection Time: 04/03/24 12:05 PM   Specimen: BLOOD  Result Value Ref Range Status   Specimen Description   Final    BLOOD LEFT HAND Performed at [facility], [address] [place], [place] [address]    Special Requests   Final    BOTTLES DRAWN AEROBIC AND ANAEROBIC Blood Culture results may not be optimal due to an inadequate volume of blood received in culture bottles Performed at [facility], [address] [place], [place] [address]    Culture  Setup Time   Final    ANAEROBIC BOTTLE ONLY GRAM POSITIVE COCCI Gram Stain Report Called to,Read Back By and Verified With: L IRVING AT 0650 04/04/24 BY A WILSON CRITICAL RESULT CALLED TO, READ BACK BY AND VERIFIED WITH: PHARMD STEVEN H on [id number] @1225  by SM    Culture (A)  Final    STAPHYLOCOCCUS HOMINIS THE SIGNIFICANCE OF ISOLATING THIS ORGANISM FROM A SINGLE SET OF BLOOD CULTURES WHEN MULTIPLE SETS ARE DRAWN IS UNCERTAIN. PLEASE NOTIFY THE MICROBIOLOGY DEPARTMENT WITHIN ONE WEEK IF SPECIATION AND SENSITIVITIES ARE REQUIRED. Performed at [facility], [address] [place], [place] [address]    Report Status 04/06/2024 FINAL  Final  Blood Culture ID Panel (Reflexed)     Status: Abnormal   Collection Time: 04/03/24 12:05 PM  Result Value Ref Range Status   Enterococcus faecalis NOT DETECTED NOT DETECTED Final   Enterococcus Faecium NOT DETECTED NOT DETECTED Final   Listeria monocytogenes NOT DETECTED NOT DETECTED Final    Staphylococcus species DETECTED (A) NOT DETECTED Final    Comment: CRITICAL RESULT CALLED TO, READ BACK BY AND VERIFIED WITH: PHARMD STEVEN H on [id number] @1225  by SM    Staphylococcus aureus (BCID) NOT DETECTED NOT DETECTED Final   Staphylococcus epidermidis NOT DETECTED NOT DETECTED Final   Staphylococcus lugdunensis NOT DETECTED NOT DETECTED Final   Streptococcus species NOT DETECTED NOT DETECTED Final   Streptococcus agalactiae NOT DETECTED NOT DETECTED Final   Streptococcus pneumoniae NOT DETECTED NOT DETECTED Final   Streptococcus pyogenes NOT DETECTED NOT DETECTED Final   A.calcoaceticus-baumannii NOT DETECTED NOT DETECTED Final   Bacteroides fragilis NOT DETECTED NOT DETECTED Final   Enterobacterales NOT DETECTED NOT DETECTED Final   Enterobacter cloacae complex NOT DETECTED NOT DETECTED Final   Escherichia coli NOT DETECTED NOT DETECTED Final   Klebsiella aerogenes NOT DETECTED NOT DETECTED Final   Klebsiella oxytoca NOT DETECTED NOT DETECTED Final   Klebsiella pneumoniae NOT DETECTED NOT DETECTED Final   Proteus species NOT DETECTED NOT DETECTED Final   Salmonella species NOT DETECTED NOT DETECTED Final   Serratia marcescens NOT DETECTED NOT DETECTED Final   Haemophilus influenzae NOT DETECTED NOT DETECTED Final   Neisseria meningitidis NOT DETECTED NOT DETECTED Final   Pseudomonas aeruginosa NOT DETECTED NOT DETECTED Final   Stenotrophomonas maltophilia NOT DETECTED NOT DETECTED Final   Candida albicans NOT DETECTED NOT DETECTED Final   Candida auris NOT DETECTED NOT DETECTED Final   Candida glabrata NOT DETECTED NOT  DETECTED Final   Candida krusei NOT DETECTED NOT DETECTED Final   Candida parapsilosis NOT DETECTED NOT DETECTED Final   Candida tropicalis NOT DETECTED NOT DETECTED Final   Cryptococcus neoformans/gattii NOT DETECTED NOT DETECTED Final    Comment: Performed at [facility], [address] [place], [place] [address]  Urine Culture     Status: Abnormal    Collection Time: 04/03/24  2:00 PM   Specimen: Urine, Clean Catch  Result Value Ref Range Status   Specimen Description   Final    URINE, CLEAN CATCH Performed at [facility], [address], [place], [place] [address]    Special Requests   Final    NONE Performed at [facility], [address] [place], [place] [address]    Culture 80,000 COLONIES/mL ENTEROCOCCUS FAECALIS (A)  Final   Report Status 04/05/2024 FINAL  Final   Organism ID, Bacteria ENTEROCOCCUS FAECALIS (A)  Final      Susceptibility   Enterococcus faecalis - MIC*    AMPICILLIN  <=2 SENSITIVE Sensitive     NITROFURANTOIN <=16 SENSITIVE Sensitive     VANCOMYCIN 1 SENSITIVE Sensitive     * 80,000 COLONIES/mL ENTEROCOCCUS FAECALIS  Culture, blood (Routine X 2) w Reflex to ID Panel     Status: None   Collection Time: 04/05/24  1:39 PM   Specimen: BLOOD  Result Value Ref Range Status   Specimen Description BLOOD RIGHT ANTECUBITAL  Final   Special Requests   Final    BOTTLES DRAWN AEROBIC AND ANAEROBIC Blood Culture adequate volume   Culture   Final    NO GROWTH 5 DAYS Performed at [facility], [address] [place], [place] [address]    Report Status 04/10/2024 FINAL  Final  Culture, blood (Routine X 2) w Reflex to ID Panel     Status: None   Collection Time: 04/05/24  1:40 PM   Specimen: BLOOD RIGHT HAND  Result Value Ref Range Status   Specimen Description BLOOD RIGHT HAND  Final   Special Requests   Final    BOTTLES DRAWN AEROBIC ONLY Blood Culture results may not be optimal due to an inadequate volume of blood received in culture bottles   Culture   Final    NO GROWTH 5 DAYS Performed at [facility], [address] [place], [place] [address]    Report Status 04/10/2024 FINAL  Final     Radiological Exams on Admission: DG Chest Port 1 View Result Date: 04/11/2024 CLINICAL DATA:  Short of breath EXAM: PORTABLE CHEST 1 VIEW COMPARISON:  04/05/2024 FINDINGS: Single frontal view of the chest demonstrates  stable enlargement of the cardiac silhouette. Continued pulmonary vascular congestion, with mild background interstitial prominence and patchy basilar consolidation concerning for developing edema. Trace right effusion. No pneumothorax. IMPRESSION: 1. Mild congestive heart failure, with no significant change in volume status since prior study. Electronically Signed   By: Bobbye Burrow M.D.   On: 04/11/2024 22:00    EKG: Independently reviewed.  Pending.  Assessment/Plan Active Problems:   COPD (chronic obstructive pulmonary disease) (HCC)   Chronic systolic heart failure (HCC)   HTN (hypertension)   CKD stage 3a, GFR 45-59 ml/min (HCC)   BPH (benign prostatic hyperplasia)   Hyperlipidemia associated with type 2 diabetes mellitus (HCC)   OSA (obstructive sleep apnea)   Anemia   Bladder cancer (HCC)   Acute respiratory failure with hypoxia and hypercapnia (HCC)   Acute and chronic respiratory failure with hypercapnia (HCC)  A-fib (HCC)   Unresponsive episode    Acute respiratory failure with hypoxia and hypercapnia likely a combination of COPD and decompensation of OSA.  Not sure if patient has been using his BiPAP at the facility.  Will keep patient on BiPAP overnight.  Scheduled and as needed nebulizer and Pulmicort  for COPD.  Will need to get further history when patient's wife is able to be reached. Possible pneumonia on empiric antibiotics check procalcitonin. History of chronic HFrEF last EF measured was 50 to 55% with reduced RV function on June 18, 2023.  Did receive 1 dose of Lasix  20 mg IV following which respirations improved will continue p.o. Lasix  daily.  Follow intake output Daily weights and metabolic panel. Unresponsive episode likely if could be from hypercarbia.  CT head is showing abnormal lesion in the left temporal lobe for which I discussed with Dr. Romilda Coaster will advised getting MRI brain.  Will also check EEG and 2D echo. History of A-fib on  Eliquis . History of bladder cancer undergoing therapy. Chronic kidney disease stage III creatinine is slightly better from recent past when patient was discharged on 04/09/2024 but has been progressively worsening since last July 2024.  CT scan did show some abnormality in the right ureter.  Patient is undergoing therapy for bladder cancer will need follow-up with urologist. Abnormality in the rectosigmoid area on CAT scan done at [facility] will need follow-up as outpatient with gastroenterologist. Chronic anemia follow CBC. Diabetes mellitus type 2 on concentrated insulin .  Last hemoglobin A1c was 7.8  9 days ago. BPH on tamsulosin . Neuropathy on gabapentin .  Since patient has acute respiratory failure with hypoxia and hypercarbia secondary COPD and possible CHF and pneumonia will need close monitoring and further workup and more than 2 midnight stay.   DVT prophylaxis: Eliquis . Code Status: Full code. Family Communication: Unable to reach patient's wife. Disposition Plan: Progressive care. Consults called: Discussed with neurologist. Admission status: Patient.

## 2024-04-12 ENCOUNTER — Inpatient Hospital Stay (HOSPITAL_COMMUNITY)

## 2024-04-12 ENCOUNTER — Encounter (HOSPITAL_COMMUNITY): Payer: Self-pay | Admitting: Internal Medicine

## 2024-04-12 ENCOUNTER — Other Ambulatory Visit: Payer: Self-pay

## 2024-04-12 DIAGNOSIS — J9601 Acute respiratory failure with hypoxia: Secondary | ICD-10-CM

## 2024-04-12 DIAGNOSIS — R569 Unspecified convulsions: Secondary | ICD-10-CM

## 2024-04-12 DIAGNOSIS — R4182 Altered mental status, unspecified: Secondary | ICD-10-CM | POA: Diagnosis not present

## 2024-04-12 DIAGNOSIS — N179 Acute kidney failure, unspecified: Secondary | ICD-10-CM | POA: Diagnosis present

## 2024-04-12 DIAGNOSIS — J9602 Acute respiratory failure with hypercapnia: Secondary | ICD-10-CM | POA: Diagnosis not present

## 2024-04-12 DIAGNOSIS — R55 Syncope and collapse: Secondary | ICD-10-CM

## 2024-04-12 LAB — GLUCOSE, CAPILLARY
Glucose-Capillary: 153 mg/dL — ABNORMAL HIGH (ref 70–99)
Glucose-Capillary: 130 mg/dL — ABNORMAL HIGH (ref 70–99)
Glucose-Capillary: 144 mg/dL — ABNORMAL HIGH (ref 70–99)
Glucose-Capillary: 265 mg/dL — ABNORMAL HIGH (ref 70–99)

## 2024-04-12 LAB — C DIFFICILE QUICK SCREEN W PCR REFLEX
C Diff antigen: POSITIVE — AB
C Diff toxin: NEGATIVE

## 2024-04-12 LAB — BASIC METABOLIC PANEL WITH GFR
Anion gap: 9 (ref 5–15)
BUN: 65 mg/dL — ABNORMAL HIGH (ref 8–23)
CO2: 27 mmol/L (ref 22–32)
Calcium: 8.2 mg/dL — ABNORMAL LOW (ref 8.9–10.3)
Chloride: 104 mmol/L (ref 98–111)
Creatinine, Ser: 2.23 mg/dL — ABNORMAL HIGH (ref 0.61–1.24)
GFR, Estimated: 30 mL/min — ABNORMAL LOW (ref >60)
Glucose, Bld: 142 mg/dL — ABNORMAL HIGH (ref 70–99)
Potassium: 4.7 mmol/L (ref 3.5–5.1)
Sodium: 140 mmol/L (ref 135–145)

## 2024-04-12 LAB — ECHOCARDIOGRAM COMPLETE
AR max vel: 3.53 cm2
AV Area VTI: 3.32 cm2
AV Area mean vel: 2.94 cm2
AV Mean grad: 5 mmHg
AV Peak grad: 10.1 mmHg
Ao pk vel: 1.59 m/s
Area-P 1/2: 2.51 cm2
Calc EF: 64 %
Est EF: 50
Height: 68 in
S' Lateral: 4 cm
Single Plane A2C EF: 65.9 %
Single Plane A4C EF: 60.4 %
Weight: 4059.99 [oz_av]

## 2024-04-12 LAB — CBC
HCT: 34 % — ABNORMAL LOW (ref 39.0–52.0)
Hemoglobin: 10.3 g/dL — ABNORMAL LOW (ref 13.0–17.0)
MCH: 28.3 pg (ref 26.0–34.0)
MCHC: 30.3 g/dL (ref 30.0–36.0)
MCV: 93.4 fL (ref 80.0–100.0)
Platelets: 183 10*3/uL (ref 150–400)
RBC: 3.64 MIL/uL — ABNORMAL LOW (ref 4.22–5.81)
RDW: 14.1 % (ref 11.5–15.5)
WBC: 4.9 10*3/uL (ref 4.0–10.5)
nRBC: 0 % (ref 0.0–0.2)

## 2024-04-12 LAB — TROPONIN I (HIGH SENSITIVITY): Troponin I (High Sensitivity): 21 ng/L — ABNORMAL HIGH (ref <18)

## 2024-04-12 LAB — PROCALCITONIN: Procalcitonin: 0.67 ng/mL

## 2024-04-12 LAB — MRSA NEXT GEN BY PCR, NASAL: MRSA by PCR Next Gen: DETECTED — AB

## 2024-04-12 LAB — CLOSTRIDIUM DIFFICILE BY PCR, REFLEXED: Toxigenic C. Difficile by PCR: NEGATIVE

## 2024-04-12 MED ORDER — CHLORHEXIDINE GLUCONATE CLOTH 2 % EX PADS
6.0000 | MEDICATED_PAD | Freq: Every day | CUTANEOUS | Status: AC
  Administered 2024-04-12 – 2024-04-15 (×2): 6 via TOPICAL

## 2024-04-12 MED ORDER — SODIUM CHLORIDE 0.9 % IV SOLN
2.0000 g | Freq: Two times a day (BID) | INTRAVENOUS | Status: DC
  Administered 2024-04-12 (×3): 2 g via INTRAVENOUS
  Filled 2024-04-12 (×3): qty 12.5

## 2024-04-12 MED ORDER — INSULIN REGULAR HUMAN (CONC) 500 UNIT/ML ~~LOC~~ SOPN
15.0000 [IU] | PEN_INJECTOR | Freq: Three times a day (TID) | SUBCUTANEOUS | Status: DC
  Administered 2024-04-12 – 2024-04-17 (×14): 15 [IU] via SUBCUTANEOUS
  Filled 2024-04-12 (×2): qty 3

## 2024-04-12 MED ORDER — FUROSEMIDE 20 MG PO TABS
20.0000 mg | ORAL_TABLET | Freq: Every day | ORAL | Status: DC
  Administered 2024-04-12 – 2024-04-15 (×4): 20 mg via ORAL
  Filled 2024-04-12 (×4): qty 1

## 2024-04-12 MED ORDER — INSULIN ASPART 100 UNIT/ML IJ SOLN
0.0000 [IU] | Freq: Three times a day (TID) | INTRAMUSCULAR | Status: DC
  Administered 2024-04-12: 2 [IU] via SUBCUTANEOUS
  Administered 2024-04-12: 1 [IU] via SUBCUTANEOUS
  Administered 2024-04-13: 3 [IU] via SUBCUTANEOUS
  Administered 2024-04-13 – 2024-04-15 (×7): 2 [IU] via SUBCUTANEOUS
  Administered 2024-04-15: 3 [IU] via SUBCUTANEOUS
  Administered 2024-04-16: 1 [IU] via SUBCUTANEOUS
  Administered 2024-04-16: 2 [IU] via SUBCUTANEOUS
  Administered 2024-04-16: 3 [IU] via SUBCUTANEOUS
  Administered 2024-04-17: 2 [IU] via SUBCUTANEOUS

## 2024-04-12 MED ORDER — IPRATROPIUM-ALBUTEROL 0.5-2.5 (3) MG/3ML IN SOLN: 3.0000 mL | RESPIRATORY_TRACT | Status: DC

## 2024-04-12 MED ORDER — IPRATROPIUM-ALBUTEROL 0.5-2.5 (3) MG/3ML IN SOLN
3.0000 mL | RESPIRATORY_TRACT | Status: DC
  Administered 2024-04-12 (×5): 3 mL via RESPIRATORY_TRACT
  Filled 2024-04-12 (×5): qty 3

## 2024-04-12 MED ORDER — APIXABAN 5 MG PO TABS
5.0000 mg | ORAL_TABLET | Freq: Two times a day (BID) | ORAL | Status: DC
  Administered 2024-04-12 – 2024-04-17 (×11): 5 mg via ORAL
  Filled 2024-04-12 (×11): qty 1

## 2024-04-12 MED ORDER — FERROUS SULFATE 325 (65 FE) MG PO TABS
325.0000 mg | ORAL_TABLET | ORAL | Status: DC
  Administered 2024-04-12 – 2024-04-17 (×3): 325 mg via ORAL
  Filled 2024-04-12 (×3): qty 1

## 2024-04-12 MED ORDER — ATORVASTATIN CALCIUM 40 MG PO TABS
40.0000 mg | ORAL_TABLET | Freq: Every day | ORAL | Status: DC
  Administered 2024-04-12 – 2024-04-16 (×5): 40 mg via ORAL
  Filled 2024-04-12 (×5): qty 1

## 2024-04-12 MED ORDER — IPRATROPIUM-ALBUTEROL 0.5-2.5 (3) MG/3ML IN SOLN
3.0000 mL | RESPIRATORY_TRACT | Status: DC | PRN
Start: 2024-04-12 — End: 2024-04-17

## 2024-04-12 MED ORDER — PERFLUTREN LIPID MICROSPHERE
1.0000 mL | INTRAVENOUS | Status: AC | PRN
  Administered 2024-04-12: 2 mL via INTRAVENOUS

## 2024-04-12 MED ORDER — INSULIN REGULAR HUMAN (CONC) 500 UNIT/ML ~~LOC~~ SOPN
30.0000 [IU] | PEN_INJECTOR | Freq: Three times a day (TID) | SUBCUTANEOUS | Status: DC
  Administered 2024-04-12: 30 [IU] via SUBCUTANEOUS
  Filled 2024-04-12: qty 3

## 2024-04-12 MED ORDER — GABAPENTIN 100 MG PO CAPS
100.0000 mg | ORAL_CAPSULE | Freq: Two times a day (BID) | ORAL | Status: DC
  Administered 2024-04-12 – 2024-04-17 (×11): 100 mg via ORAL
  Filled 2024-04-12 (×11): qty 1

## 2024-04-12 MED ORDER — VANCOMYCIN HCL 2000 MG/400ML IV SOLN
2000.0000 mg | Freq: Once | INTRAVENOUS | Status: AC
  Administered 2024-04-12: 2000 mg via INTRAVENOUS
  Filled 2024-04-12: qty 400

## 2024-04-12 MED ORDER — VANCOMYCIN HCL 1500 MG/300ML IV SOLN: 1500.0000 mg | INTRAVENOUS | Status: DC

## 2024-04-12 MED ORDER — IPRATROPIUM-ALBUTEROL 0.5-2.5 (3) MG/3ML IN SOLN
3.0000 mL | Freq: Three times a day (TID) | RESPIRATORY_TRACT | Status: DC
  Administered 2024-04-13 – 2024-04-14 (×4): 3 mL via RESPIRATORY_TRACT
  Filled 2024-04-12 (×4): qty 3

## 2024-04-12 MED ORDER — BUDESONIDE 0.25 MG/2ML IN SUSP
0.2500 mg | Freq: Two times a day (BID) | RESPIRATORY_TRACT | Status: DC
  Administered 2024-04-12 – 2024-04-17 (×10): 0.25 mg via RESPIRATORY_TRACT
  Filled 2024-04-12 (×11): qty 2

## 2024-04-12 MED ORDER — TAMSULOSIN HCL 0.4 MG PO CAPS
0.4000 mg | ORAL_CAPSULE | Freq: Every day | ORAL | Status: DC
  Administered 2024-04-12 – 2024-04-16 (×5): 0.4 mg via ORAL
  Filled 2024-04-12 (×5): qty 1

## 2024-04-12 MED ORDER — BUDESONIDE 0.25 MG/2ML IN SUSP: 0.2500 mg | Freq: Two times a day (BID) | RESPIRATORY_TRACT | Status: DC

## 2024-04-12 MED ORDER — ENSURE MAX PROTEIN PO LIQD
11.0000 [oz_av] | Freq: Two times a day (BID) | ORAL | Status: DC
  Administered 2024-04-12 – 2024-04-17 (×7): 11 [oz_av] via ORAL
  Filled 2024-04-12 (×11): qty 330

## 2024-04-12 MED ORDER — ADULT MULTIVITAMIN W/MINERALS CH
1.0000 | ORAL_TABLET | Freq: Every day | ORAL | Status: DC
  Administered 2024-04-12 – 2024-04-17 (×6): 1 via ORAL
  Filled 2024-04-12 (×6): qty 1

## 2024-04-12 MED ORDER — ROPINIROLE HCL 1 MG PO TABS
1.0000 mg | ORAL_TABLET | Freq: Every day | ORAL | Status: DC
  Administered 2024-04-12 – 2024-04-16 (×5): 1 mg via ORAL
  Filled 2024-04-12 (×5): qty 1

## 2024-04-12 MED ORDER — MUPIROCIN 2 % EX OINT
1.0000 | TOPICAL_OINTMENT | Freq: Two times a day (BID) | CUTANEOUS | Status: AC
  Administered 2024-04-12 – 2024-04-16 (×10): 1 via NASAL
  Filled 2024-04-12 (×2): qty 22

## 2024-04-12 NOTE — [place] FL2 (Signed)
 [place]  MEDICAID FL2 LEVEL OF CARE FORM     IDENTIFICATION  Patient Name: Ricky WALDBILLIG Sr. Birthdate: 03/10/1951 Sex: male Admission Date (Current Location): 04/11/2024  [employer] and [place] Number:  [employer] and Address:  [name], [address], [place], [place] [address]      Provider Number: [id number]  Attending Physician Name and Address:  Elgergawy, Ardia Kraft, MD  Relative Name and Phone Number:       Current Level of Care: Hospital Recommended Level of Care: Skilled Nursing Facility Prior Approval Number:    Date Approved/Denied:   PASRR Number: [id number]  Discharge Plan: SNF    Current Diagnoses: Patient Active Problem List   Diagnosis Date Noted   Acute renal failure (ARF) (HCC) 04/12/2024   Unresponsive episode 04/11/2024   Sepsis (HCC) 04/03/2024   A-fib (HCC) 04/03/2024   Renal insufficiency 06/19/2023   Acute and chronic respiratory failure with hypercapnia (HCC) 06/19/2023   Bradycardia 05/13/2023   Acute on chronic respiratory failure with hypoxia and hypercapnia (HCC) 05/11/2023   Acute respiratory failure with hypoxia and hypercapnia (HCC) 05/11/2023   Class 2 obesity 05/07/2023   CKD stage 3a, GFR 45-59 ml/min (HCC) 05/07/2023   Impaired functional mobility, balance, gait, and endurance 09/27/2018   Unilateral primary osteoarthritis, left hip 02/10/2017   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   H/O total hip arthroplasty, left 02/10/2017   History of total hip arthroplasty 02/10/2017   Respiratory failure (HCC)    OSA (obstructive sleep apnea)    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   HTN (hypertension) 10/13/2016   Anemia 03/02/2016   BPH (benign prostatic hyperplasia) 03/02/2016   Type 2 diabetes mellitus with hyperlipidemia (HCC) 03/02/2016   OAB (overactive bladder) 03/02/2016   CAD (coronary artery disease) 02/09/2015   Morbid obesity with BMI of 40.0-44.9, adult (HCC) 01/29/2015    Bladder cancer (HCC) 01/29/2015   Chronic systolic heart failure (HCC)    Esophageal reflux    Chest pain 01/08/2015   Insulin  dependent diabetes mellitus 01/08/2015   COPD (chronic obstructive pulmonary disease) (HCC) 01/08/2015   Hyperlipidemia associated with type 2 diabetes mellitus (HCC) 01/08/2015    Orientation RESPIRATION BLADDER Height & Weight     Self, Place, Time  O2 (2L nasal cannula) Incontinent, Indwelling catheter Weight: 253 lb 12 oz (115.1 kg) Height:  5\' 8"  (172.7 cm)  BEHAVIORAL SYMPTOMS/MOOD NEUROLOGICAL BOWEL NUTRITION STATUS      Continent Diet (See dc summary)  AMBULATORY STATUS COMMUNICATION OF NEEDS Skin   Extensive Assist Verbally PU Stage and Appropriate Care (Stage II on coccyx)                       Personal Care Assistance Level of Assistance  Bathing, Feeding, Dressing Bathing Assistance: Maximum assistance Feeding assistance: Limited assistance Dressing Assistance: Maximum assistance     Functional Limitations Info  Sight, Hearing, Speech Sight Info: Adequate Hearing Info: Impaired Speech Info: Adequate    SPECIAL CARE FACTORS FREQUENCY  PT (By licensed PT), OT (By licensed OT)     PT Frequency: 5x/week OT Frequency: 5x/week            Contractures Contractures Info: Not present    Additional Factors Info  Code Status, Allergies, Isolation Precautions, Insulin  Sliding Scale Code Status Info: Full Allergies Info: Empagliflozin , Poison [employer] Extract (Poison Fisher Scientific Extract), Poison Oak Extract   Insulin  Sliding Scale Info: See dc summary Isolation Precautions Info: Enteric  precautions     Current Medications (04/12/2024):  This is the current hospital active medication list Current Facility-Administered Medications  Medication Dose Route Frequency Provider Last Rate Last Admin   apixaban  (ELIQUIS ) tablet 5 mg  5 mg Oral BID Kakrakandy, Arshad N, MD   5 mg at 04/12/24 1134   atorvastatin  (LIPITOR ) tablet 40 mg  40 mg Oral QHS  Angelene Kelly, MD       budesonide  (PULMICORT ) nebulizer solution 0.25 mg  0.25 mg Nebulization BID Kakrakandy, Arshad N, MD   0.25 mg at 04/12/24 0736   ceFEPIme (MAXIPIME) 2 g in sodium chloride  0.9 % 100 mL IVPB  2 g Intravenous BID Kakrakandy, Arshad N, MD 200 mL/hr at 04/12/24 1139 2 g at 04/12/24 1139   Chlorhexidine  Gluconate Cloth 2 % PADS 6 each  6 each Topical Daily Elgergawy, Dawood S, MD       ferrous sulfate  tablet 325 mg  325 mg Oral Once per day on Monday Wednesday Friday Angelene Kelly, MD   325 mg at 04/12/24 1134   furosemide  (LASIX ) tablet 20 mg  20 mg Oral Daily Kakrakandy, Arshad N, MD   20 mg at 04/12/24 1134   gabapentin  (NEURONTIN ) capsule 100 mg  100 mg Oral BID Angelene Kelly, MD   100 mg at 04/12/24 1134   insulin  aspart (novoLOG ) injection 0-9 Units  0-9 Units Subcutaneous TID WC Angelene Kelly, MD   1 Units at 04/12/24 1142   insulin  regular human CONCENTRATED (HUMULIN R ) 500 UNIT/ML KwikPen 15 Units  15 Units Subcutaneous TID WC Elgergawy, Ardia Kraft, MD       ipratropium-albuterol  (DUONEB) 0.5-2.5 (3) MG/3ML nebulizer solution 3 mL  3 mL Nebulization Q2H PRN Angelene Kelly, MD       ipratropium-albuterol  (DUONEB) 0.5-2.5 (3) MG/3ML nebulizer solution 3 mL  3 mL Nebulization Q4H Kakrakandy, Arshad N, MD   3 mL at 04/12/24 1634   multivitamin with minerals tablet 1 tablet  1 tablet Oral Daily Elgergawy, Dawood S, MD       mupirocin ointment (BACTROBAN) 2 % 1 Application  1 Application Nasal BID Elgergawy, Dawood S, MD       protein supplement (ENSURE MAX) liquid  11 oz Oral BID Elgergawy, Dawood S, MD       rOPINIRole  (REQUIP ) tablet 1 mg  1 mg Oral QHS Angelene Kelly, MD       tamsulosin  (FLOMAX ) capsule 0.4 mg  0.4 mg Oral QHS Angelene Kelly, MD       [START ON 04/14/2024] vancomycin (VANCOREADY) IVPB 1500 mg/300 mL  1,500 mg Intravenous Q48H Angelene Kelly, MD         Discharge Medications: Please see discharge summary  for a list of discharge medications.  Relevant Imaging Results:  Relevant Lab Results:   Additional Information SS # [id number]  Jannice Mends, LCSW

## 2024-04-12 NOTE — Progress Notes (Signed)
   Inpatient Rehab Admissions Coordinator :  Per therapy recommendations patient was screened for CIR candidacy by Jeannetta Millman RN MSN. Patient is not at a level to tolerate the intensity required to pursue a CIR admit . Also noted he is from the [facility] and wife requesting him to return there per SW 5/16. Please contact me with any questions.  Jeannetta Millman RN MSN Admissions Coordinator [phone]

## 2024-04-12 NOTE — Procedures (Signed)
 Routine EEG Report  Ricky Johnston. is a 73 y.o. male with a history of altered mental status who is undergoing an EEG to evaluate for seizures.  Report: This EEG was acquired with electrodes placed according to the International 10-20 electrode system (including Fp1, Fp2, F3, F4, C3, C4, P3, P4, O1, O2, T3, T4, T5, T6, A1, A2, Fz, Cz, Pz). The following electrodes were missing or displaced: none.  The occipital dominant rhythm was 7 Hz. This activity is reactive to stimulation. Drowsiness was manifested by background fragmentation; deeper stages of sleep were identified by K complexes and sleep spindles. There was no focal slowing. There were no interictal epileptiform discharges. There were no electrographic seizures identified. There was no abnormal response to photic stimulation or hyperventilation.   Impression and clinical correlation: This EEG was obtained while awake and asleep and is abnormal due to mild diffuse slowing indicative of global cerebral dysfunction. Epileptiform abnormalities were not seen during this recording.  Greg Leaks, MD [facility] [phone]  If 7pm- 7am, please page neurology on call as listed in AMION.

## 2024-04-12 NOTE — Plan of Care (Signed)
  Problem: Education: Goal: Knowledge of General Education information will improve Description: Including pain rating scale, medication(s)/side effects and non-pharmacologic comfort measures Outcome: Progressing   Problem: Health Behavior/Discharge Planning: Goal: Ability to manage health-related needs will improve Outcome: Progressing   Problem: Clinical Measurements: Goal: Ability to maintain clinical measurements within normal limits will improve Outcome: Progressing Goal: Will remain free from infection Outcome: Progressing Goal: Diagnostic test results will improve Outcome: Progressing Goal: Respiratory complications will improve Outcome: Progressing Goal: Cardiovascular complication will be avoided Outcome: Progressing   Problem: Activity: Goal: Risk for activity intolerance will decrease Outcome: Progressing   Problem: Nutrition: Goal: Adequate nutrition will be maintained Outcome: Progressing   Problem: Coping: Goal: Level of anxiety will decrease Outcome: Progressing   Problem: Elimination: Goal: Will not experience complications related to bowel motility Outcome: Progressing Goal: Will not experience complications related to urinary retention Outcome: Progressing   Problem: Pain Managment: Goal: General experience of comfort will improve and/or be controlled Outcome: Progressing   Problem: Safety: Goal: Ability to remain free from injury will improve Outcome: Progressing   Problem: Skin Integrity: Goal: Risk for impaired skin integrity will decrease Outcome: Progressing   Problem: Education: Goal: Ability to describe self-care measures that may prevent or decrease complications (Diabetes Survival Skills Education) will improve Outcome: Progressing Goal: Individualized Educational Video(s) Outcome: Progressing   Problem: Coping: Goal: Ability to adjust to condition or change in health will improve Outcome: Progressing   Problem: Fluid  Volume: Goal: Ability to maintain a balanced intake and output will improve Outcome: Progressing   Problem: Health Behavior/Discharge Planning: Goal: Ability to identify and utilize available resources and services will improve Outcome: Progressing Goal: Ability to manage health-related needs will improve Outcome: Progressing   Problem: Metabolic: Goal: Ability to maintain appropriate glucose levels will improve Outcome: Progressing   Problem: Nutritional: Goal: Maintenance of adequate nutrition will improve Outcome: Progressing Goal: Progress toward achieving an optimal weight will improve Outcome: Progressing   Problem: Skin Integrity: Goal: Risk for impaired skin integrity will decrease Outcome: Progressing   Problem: Tissue Perfusion: Goal: Adequacy of tissue perfusion will improve Outcome: Progressing   Problem: Education: Goal: Knowledge of disease or condition will improve Outcome: Progressing Goal: Knowledge of the prescribed therapeutic regimen will improve Outcome: Progressing Goal: Individualized Educational Video(s) Outcome: Progressing   Problem: Activity: Goal: Ability to tolerate increased activity will improve Outcome: Progressing Goal: Will verbalize the importance of balancing activity with adequate rest periods Outcome: Progressing   Problem: Respiratory: Goal: Ability to maintain a clear airway will improve Outcome: Progressing Goal: Levels of oxygenation will improve Outcome: Progressing Goal: Ability to maintain adequate ventilation will improve Outcome: Progressing

## 2024-04-12 NOTE — Progress Notes (Signed)
 Pt in MRI will try back as schedule permits for EEG.

## 2024-04-12 NOTE — TOC Initial Note (Signed)
 Transition of Care (TOC) - Initial/Assessment Note    Patient Details  Name: Ricky HARLIN Sr. MRN: [id number] Date of Birth: March 13, 1951  Transition of Care [facility]) CM/SW Contact:    Jannice Mends, LCSW Phone Number: 04/12/2024, 11:40 AM  Clinical Narrative:                 Patient admitted from [facility] and CSW spoke with his wife who confirmed she would like him to return at discharge. CSW confirmed with [facility] that they are hopeful to have a bed Monday (no bed hold).   Expected Discharge Plan: Skilled Nursing Facility Barriers to Discharge: Continued Medical Work up   Patient Goals and CMS Choice Patient states their goals for this hospitalization and ongoing recovery are:: Return to SNF CMS Medicare.gov Compare Post Acute Care list provided to:: Patient Represenative (must comment) Choice offered to / list presented to : Spouse [place] ownership interest in [facility].provided to:: Spouse    Expected Discharge Plan and Services In-house Referral: Clinical Social Work   Post Acute Care Choice: Skilled Nursing Facility Living arrangements for the past 2 months: Single Family Home, Skilled Nursing Facility                                      Prior Living Arrangements/Services Living arrangements for the past 2 months: Single Family Home, Skilled Nursing Facility Lives with:: Spouse Patient language and need for interpreter reviewed:: Yes Do you feel safe going back to the place where you live?: Yes      Need for Family Participation in Patient Care: Yes (Comment) Care giver support system in place?: Yes (comment)   Criminal Activity/Legal Involvement Pertinent to Current Situation/Hospitalization: No - Comment as needed  Activities of Daily Living   ADL Screening (condition at time of admission) Independently performs ADLs?: No Does the patient have a NEW difficulty with bathing/dressing/toileting/self-feeding that is expected to last  >3 days?: Yes (Initiates electronic notice to provider for possible OT consult) Does the patient have a NEW difficulty with getting in/out of bed, walking, or climbing stairs that is expected to last >3 days?: Yes (Initiates electronic notice to provider for possible PT consult) Does the patient have a NEW difficulty with communication that is expected to last >3 days?: No Is the patient deaf or have difficulty hearing?: Yes Does the patient have difficulty seeing, even when wearing glasses/contacts?: No Does the patient have difficulty concentrating, remembering, or making decisions?: No  Permission Sought/Granted Permission sought to share information with : Facility Medical sales representative, Family Supports Permission granted to share information with : Yes, Verbal Permission Granted  Share Information with NAME: Gracie Lav  Permission granted to share info w AGENCY: [facility]  Permission granted to share info w Relationship: Spouse  Permission granted to share info w Contact Information: [phone]  Emotional Assessment Appearance:: Appears stated age Attitude/Demeanor/Rapport: Unable to Assess Affect (typically observed): Unable to Assess Orientation: : Oriented to Self, Oriented to Place, Oriented to Situation Alcohol / Substance Use: Not Applicable Psych Involvement: No (comment)  Admission diagnosis:  Acute renal failure (ARF) (HCC) [N17.9] Patient Active Problem List   Diagnosis Date Noted   Acute renal failure (ARF) (HCC) 04/12/2024   Unresponsive episode 04/11/2024   Sepsis (HCC) 04/03/2024   A-fib (HCC) 04/03/2024   Renal insufficiency 06/19/2023   Acute and chronic respiratory failure with hypercapnia (HCC) 06/19/2023  Bradycardia 05/13/2023   Acute on chronic respiratory failure with hypoxia and hypercapnia (HCC) 05/11/2023   Acute respiratory failure with hypoxia and hypercapnia (HCC) 05/11/2023   Class 2 obesity 05/07/2023   CKD stage 3a, GFR 45-59 ml/min (HCC)  05/07/2023   Impaired functional mobility, balance, gait, and endurance 09/27/2018   Unilateral primary osteoarthritis, left hip 02/10/2017   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   H/O total hip arthroplasty, left 02/10/2017   History of total hip arthroplasty 02/10/2017   Respiratory failure (HCC)    OSA (obstructive sleep apnea)    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   HTN (hypertension) 10/13/2016   Anemia 03/02/2016   BPH (benign prostatic hyperplasia) 03/02/2016   Type 2 diabetes mellitus with hyperlipidemia (HCC) 03/02/2016   OAB (overactive bladder) 03/02/2016   CAD (coronary artery disease) 02/09/2015   Morbid obesity with BMI of 40.0-44.9, adult (HCC) 01/29/2015   Bladder cancer (HCC) 01/29/2015   Chronic systolic heart failure (HCC)    Esophageal reflux    Chest pain 01/08/2015   Insulin  dependent diabetes mellitus 01/08/2015   COPD (chronic obstructive pulmonary disease) (HCC) 01/08/2015   Hyperlipidemia associated with type 2 diabetes mellitus (HCC) 01/08/2015   PCP:  [facility]:   [facility] [place], [place] - [address] [place] [address] Phone: [phone] Fax: [phone]  [facility] [address] [place] - [address] [place] [address] [place] [address] [place] [address] Phone: [phone] Fax: [phone]     Social Drivers of Health (SDOH) Social History: SDOH Screenings   Food Insecurity: No Food Insecurity (04/11/2024)  Housing: Low Risk  (04/11/2024)  Transportation Needs: No Transportation Needs (04/11/2024)  Utilities: Not At Risk (04/11/2024)  Depression (PHQ2-9): Low Risk  (11/08/2021)  Social Connections: Moderately Isolated (04/11/2024)  Tobacco Use: Medium Risk (04/12/2024)   SDOH Interventions:     Readmission Risk Interventions    04/03/2024    8:38 PM  Readmission Risk Prevention Plan  Transportation Screening Complete  Medication Review (RN Care Manager) Complete  PCP  or Specialist appointment within 3-5 days of discharge Complete  HRI or Home Care Consult Complete  SW Recovery Care/Counseling Consult Complete  Palliative Care Screening Not Applicable  Skilled Nursing Facility Complete

## 2024-04-12 NOTE — Progress Notes (Signed)
 MB arrived to room to find Patient just about to start his Lunch, will come back after lunch \

## 2024-04-12 NOTE — Progress Notes (Addendum)
 Initial Nutrition Assessment  DOCUMENTATION CODES:  Obesity unspecified  INTERVENTION:  Ensure Max po BID, each supplement provides 150 kcal and 30 grams of protein.   MVI w/ minerals Automatic meal trays, preferences added to meal trays Assess documented intake and order supplements to augment intake, if indicated Monitor bowel regularity and need for bowel regimen  NUTRITION DIAGNOSIS:  Increased nutrient needs related to chronic illness (COPD) as evidenced by estimated needs.  GOAL:  Patient will meet greater than or equal to 90% of their needs  MONITOR:  PO intake, Supplement acceptance  REASON FOR ASSESSMENT:  Consult Assessment of nutrition requirement/status  ASSESSMENT:   Pt with PMH significant for COPD, afib, chronic HFrEF, obesity, sleep apnea, CKDIII, anemia. He was found unresponsive at rehab facility and taken to [facility]. Found to have acute respiratory failure with hypoxia and hypercapnia.  Of note, he was recently discharged on 5/13 after being treated for sepsis 2/2 UTI. Also being treated for bladder cancer.  5/13 discharged from [facility] 5/[facility] ED, transferred to [facility] 5/16 CT chest/abd/pelvis: diffuse infiltrates c/f pulmonary edema vs atypical infection  Unsure if patient has been using his BiPAP. Last EF from 04/2023 was 50-55%. Continues with intermittent confusion.  Average Meal Intake No meal intake documentation to review  Mentation making it difficult to obtain nutrition-related history. Overall, endorses adequate appetite PTA, but then also states he has not eaten in three days and requesting coffee. Will order Ensure Max as it is low calorie, high protein, to augment intake until intake documentation can be collected/reviewed. He is with increased protein needs d/t skin breakdown, COPD dx, recent hospitalization requiring intubation, and alerted mentation. Appears well-nourished.  Admit/Current Weight: 115.1kg  Per chart review, no significant weight  change over last year. Patient confirms this as well. No significant edema noted on exam.  Will assess bowel regularity and order bowel regimen, if indicated.   Intake/Output Summary (Last 24 hours) at 04/12/2024 1531 Last data filed at 04/12/2024 5643 Gross per 24 hour  Intake --  Output 1650 ml  Net -1650 ml    Meds: ferrous sulfate , furosemide , gabapentin , SSI 0-9 TID, Humilin 30 TID  Labs: Na+ 140 (wdl) K+ 4.7 (wdl) CBGs 141-142 x24 hours A1c 7.8 (03/2024)  NUTRITION - FOCUSED PHYSICAL EXAM:  Flowsheet Row Most Recent Value  Orbital Region No depletion  Upper Arm Region No depletion  Thoracic and Lumbar Region No depletion  Buccal Region No depletion  Temple Region No depletion  Clavicle Bone Region Mild depletion  Clavicle and Acromion Bone Region No depletion  Scapular Bone Region No depletion  Dorsal Hand No depletion  Patellar Region No depletion  Anterior Thigh Region Mild depletion  Posterior Calf Region No depletion  Edema (RD Assessment) None  Hair Reviewed  Eyes Reviewed  Mouth Reviewed  [upper and lower dentures]  Skin Reviewed  Nails Reviewed   Diet Order:   Diet Order             Diet heart healthy/carb modified Room service appropriate? Yes; Fluid consistency: Thin; Fluid restriction: 1200 mL Fluid  Diet effective now            EDUCATION NEEDS:   No education needs have been identified at this time  Skin:  Skin Assessment: Skin Integrity Issues: Skin Integrity Issues:: Stage II Stage II: coccyx  Last BM:  PTA  Height:  Ht Readings from Last 1 Encounters:  04/11/24 5\' 8"  (1.727 m)   Weight:  Wt Readings from Last  1 Encounters:  04/11/24 115.1 kg   Ideal Body Weight:  70 kg  BMI:  Body mass index is 38.58 kg/m.  Estimated Nutritional Needs:   Kcal:  1800-2000kcal  Protein:  90-105g  Fluid:  >1.9L/day  Con Decant MS, RD, LDN Registered Dietitian Clinical Nutrition RD Inpatient Contact Info in Amion

## 2024-04-12 NOTE — Progress Notes (Signed)
 PROGRESS NOTE    Ricky Johnston  WUJ:[id number] DOB: Jun 20, 1951 DOA: 04/11/2024 PCP: [facility]    No chief complaint on file.   Brief Narrative:  Ricky BENT Sr. is a 73 y.o. male with history of COPD, atrial fibrillation, chronic HFrEF, obesity, sleep apnea, chronic kidney disease stage III, anemia who was recently admitted to [facility] and discharged on 04/09/2024 after being treated for sepsis secondary to UTI and patient is also being treated for bladder cancer was found unresponsive at the rehab and was brought to the ER at [facility].  He is not sure if patient was using his BiPAP at bedtime at the facility.  I am unable to reach patient's wife to get more history.  Most of the history is obtained from the chart and epic/Care Everywhere.   ED Course: At [facility] and initial ABG showed pH of 7.23 with PCO2 of 75.  Patient was was placed on BiPAP.  Chest x-ray showed some congestion and CT chest abdomen pelvis showed diffuse infiltrates concerning for pulmonary edema versus atypical infection.  Patient was given Lasix  20 mg IV and started on empiric antibiotics for pneumonia.  Repeat ABG showed pH of 7.36 pCO2 58 patient became more alert awake.  CT of the head shows nonspecific hypoattenuation in the left medial temporal lobe.  MRI brain was suggested.  Patient was transferred to [facility] at the request of family.  At the time of my exam patient is alert awake off BiPAP moving all extremities oriented to person and place.  Following commands.   Assessment & Plan:   Principal Problem:   Acute respiratory failure with hypoxia and hypercapnia (HCC) Active Problems:   COPD (chronic obstructive pulmonary disease) (HCC)   Chronic systolic heart failure (HCC)   HTN (hypertension)   CKD stage 3a, GFR 45-59 ml/min (HCC)   BPH (benign prostatic hyperplasia)   Hyperlipidemia associated with type 2 diabetes mellitus (HCC)   OSA  (obstructive sleep apnea)   Anemia   Bladder cancer (HCC)   Acute and chronic respiratory failure with hypercapnia (HCC)   A-fib (HCC)   Unresponsive episode   Acute renal failure (ARF) (HCC)     Acute on chronic respiratory failure with hypoxia and hypercapnia  -likely a combination of COPD and decompensation of OSA.  Apparently he has not been using his BiPAP at the facility . -He was kept on BiPAP overnight, with significant improvement of mentation. -He was encouraged to use incentive spirometry and flutter valve  Acute metabolic encephalopathy/sewed of unresponsiveness -Likely due to CO2 retention, improved on BiPAP - Normal finding in CT head, follow an MRI brain - On EEG and 2D echo - Continue to monitor on telemetry  COPD -No active wheezing-Home Pulmicort  and as needed nebs  Possible pneumonia  -on empiric antibiotics check procalcitonin.  Chronic HFrEF  last EF measured was 50 to 55% with reduced RV function on June 18, 2023.  Did receive 1 dose of Lasix  20 mg IV in ED  - Appears euvolemic, continue with home dose Lasix  -  Will also check EEG and 2D echo.  - History of A-fib - on Eliquis .  History of bladder cancer - undergoing therapy.  Chronic kidney disease stage III  -creatinine is slightly better from recent past when patient was discharged on 04/09/2024 but has been progressively worsening since last July 2024.  CT scan did show some abnormality in the right ureter.  Patient is undergoing therapy for bladder cancer will need follow-up with urologist. -Avoid nephrotoxic medication, will monitor closely does appear he received IV contrast at [facility]  Abnormality in the rectosigmoid area on CAT scan - done at [facility] will need follow-up as outpatient with gastroenterologist.  Chronic anemia  -follow CBC.  Diabetes mellitus type 2 - on concentrated insulin .  Last hemoglobin A1c was 7.8  nine days ago, with insulin  sliding scale, will decrease U-500  from 30 units 3 times daily to 15 units 3 times daily  BPH  -on tamsulosin .  Neuropathy  -on gabapentin .  Diarrhea - Will check C. difficile when he was recently on antibiotics   Deconditioning -Consult PT, OT   DVT prophylaxis: Eliquis  Eliquis  Code Status: Full Family Communication: wife by phone Disposition:   Status is: Inpatient    Consultants:  none   Subjective:  This morning he is more awake and oriented and appropriate, denies any complaints, but he did have diarrhea this a.m.  Objective: Vitals:   04/11/24 2336 04/12/24 0357 04/12/24 0738 04/12/24 0824  BP: 127/69 124/63  134/61  Pulse:   61 65  Resp:   (!) 23 (!) 21  Temp: 97.7 F (36.5 C) 98.2 F (36.8 C)  98 F (36.7 C)  TempSrc: Oral Axillary  Oral  SpO2: 91%   94%  Weight:      Height:        Intake/Output Summary (Last 24 hours) at 04/12/2024 1202 Last data filed at 04/12/2024 0925 Gross per 24 hour  Intake --  Output 1650 ml  Net -1650 ml   Filed Weights   04/11/24 2136  Weight: 115.1 kg    Examination:  Awake Alert, Oriented X 3, limited frail, deconditioned Symmetrical Chest wall movement, diminished air entry at the bases RRR,No Gallops,Rubs or new Murmurs, No Parasternal Heave +ve B.Sounds, Abd Soft, No tenderness, No rebound - guarding or rigidity. No Cyanosis, Clubbing or edema, No new Rash or bruise      Data Reviewed: I have personally reviewed following labs and imaging studies  CBC: Recent Labs  Lab 04/06/24 0540 04/07/24 0626 04/08/24 0652 04/11/24 2132 04/12/24 0550  WBC 2.9* 3.9* 4.2 5.7 4.9  NEUTROABS  --   --   --  4.5  --   HGB 10.7* 10.7* 10.2* 10.8* 10.3*  HCT 37.6* 36.2* 35.5* 35.3* 34.0*  MCV 98.7 97.8 96.2 92.9 93.4  PLT 109* 128* 137* 178 183    Basic Metabolic Panel: Recent Labs  Lab 04/06/24 0540 04/08/24 0559 04/09/24 0448 04/11/24 2132 04/12/24 0550  NA 139 140 142 138 140  K 4.9 5.2* 5.0 4.8 4.7  CL 103 106 106 101 104  CO2 26 24  29 28 27   GLUCOSE 238* 101* 76 141* 142*  BUN 67* 72* 71* 65* 65*  CREATININE 2.82* 3.26* 3.00* 2.36* 2.23*  CALCIUM  8.2* 8.7* 8.8* 8.6* 8.2*    GFR: Estimated Creatinine Clearance: 36.3 mL/min (A) (by C-G formula based on SCr of 2.23 mg/dL (H)).  Liver Function Tests: Recent Labs  Lab 04/06/24 0540 04/11/24 2132  AST 13* 19  ALT 13 14  ALKPHOS 65 62  BILITOT 0.4 0.8  PROT 5.9* 6.6  ALBUMIN 2.3* 2.3*    CBG: Recent Labs  Lab 04/08/24 2229 04/09/24 0743 04/09/24 1127 04/12/24 0854 04/12/24 1138  GLUCAP 81 90 199* 153* 130*     Recent Results (from the past 240 hours)  Culture, blood (routine x 2)  Status: None   Collection Time: 04/03/24 12:00 PM   Specimen: BLOOD  Result Value Ref Range Status   Specimen Description BLOOD LEFT ARM  Final   Special Requests   Final    BOTTLES DRAWN AEROBIC AND ANAEROBIC Blood Culture results may not be optimal due to an inadequate volume of blood received in culture bottles   Culture   Final    NO GROWTH 5 DAYS Performed at [facility], [address], [place], [place] [address]    Report Status 04/08/2024 FINAL  Final  Resp panel by RT-PCR (RSV, Flu A&B, Covid) Anterior Nasal Swab     Status: None   Collection Time: 04/03/24 12:00 PM   Specimen: Anterior Nasal Swab  Result Value Ref Range Status   SARS Coronavirus 2 by RT PCR NEGATIVE NEGATIVE Final    Comment: (NOTE) SARS-CoV-2 target nucleic acids are NOT DETECTED.  The SARS-CoV-2 RNA is generally detectable in upper respiratory specimens during the acute phase of infection. The lowest concentration of SARS-CoV-2 viral copies this assay can detect is 138 copies/mL. A negative result does not preclude SARS-Cov-2 infection and should not be used as the sole basis for treatment or other patient management decisions. A negative result may occur with  improper specimen collection/handling, submission of specimen other than nasopharyngeal swab, presence of viral  mutation(s) within the areas targeted by this assay, and inadequate number of viral copies(<138 copies/mL). A negative result must be combined with clinical observations, patient history, and epidemiological information. The expected result is Negative.  Fact Sheet for Patients:  BloggerCourse.com  Fact Sheet for Healthcare Providers:  SeriousBroker.it  This test is no t yet approved or cleared by the [place]  FDA and  has been authorized for detection and/or diagnosis of SARS-CoV-2 by FDA under an Emergency Use Authorization (EUA). This EUA will remain  in effect (meaning this test can be used) for the duration of the COVID-19 declaration under Section 564(b)(1) of the Act, 21 U.S.C.section 360bbb-3(b)(1), unless the authorization is terminated  or revoked sooner.       Influenza A by PCR NEGATIVE NEGATIVE Final   Influenza B by PCR NEGATIVE NEGATIVE Final    Comment: (NOTE) The Xpert Xpress SARS-CoV-2/FLU/RSV plus assay is intended as an aid in the diagnosis of influenza from Nasopharyngeal swab specimens and should not be used as a sole basis for treatment. Nasal washings and aspirates are unacceptable for Xpert Xpress SARS-CoV-2/FLU/RSV testing.  Fact Sheet for Patients: BloggerCourse.com  Fact Sheet for Healthcare Providers: SeriousBroker.it  This test is not yet approved or cleared by the [place]  FDA and has been authorized for detection and/or diagnosis of SARS-CoV-2 by FDA under an Emergency Use Authorization (EUA). This EUA will remain in effect (meaning this test can be used) for the duration of the COVID-19 declaration under Section 564(b)(1) of the Act, 21 U.S.C. section 360bbb-3(b)(1), unless the authorization is terminated or revoked.     Resp Syncytial Virus by PCR NEGATIVE NEGATIVE Final    Comment: (NOTE) Fact Sheet for  Patients: BloggerCourse.com  Fact Sheet for Healthcare Providers: SeriousBroker.it  This test is not yet approved or cleared by the [place]  FDA and has been authorized for detection and/or diagnosis of SARS-CoV-2 by FDA under an Emergency Use Authorization (EUA). This EUA will remain in effect (meaning this test can be used) for the duration of the COVID-19 declaration under Section 564(b)(1) of the Act, 21 U.S.C. section 360bbb-3(b)(1), unless the authorization is terminated or revoked.  Performed at [facility], [address], [place], [place] [address]   Culture, blood (routine x 2)     Status: Abnormal   Collection Time: 04/03/24 12:05 PM   Specimen: BLOOD  Result Value Ref Range Status   Specimen Description   Final    BLOOD LEFT HAND Performed at [facility], [address] [place], [place] [address]    Special Requests   Final    BOTTLES DRAWN AEROBIC AND ANAEROBIC Blood Culture results may not be optimal due to an inadequate volume of blood received in culture bottles Performed at [facility], [address], [place], [place] [address]    Culture  Setup Time   Final    ANAEROBIC BOTTLE ONLY GRAM POSITIVE COCCI Gram Stain Report Called to,Read Back By and Verified With: L IRVING AT 0650 04/04/24 BY A WILSON CRITICAL RESULT CALLED TO, READ BACK BY AND VERIFIED WITH: PHARMD STEVEN H on [id number] @1225  by SM    Culture (A)  Final    STAPHYLOCOCCUS HOMINIS THE SIGNIFICANCE OF ISOLATING THIS ORGANISM FROM A SINGLE SET OF BLOOD CULTURES WHEN MULTIPLE SETS ARE DRAWN IS UNCERTAIN. PLEASE NOTIFY THE MICROBIOLOGY DEPARTMENT WITHIN ONE WEEK IF SPECIATION AND SENSITIVITIES ARE REQUIRED. Performed at [facility], [address], [place], [place] [address]    Report Status 04/06/2024 FINAL  Final  Blood Culture ID Panel (Reflexed)     Status: Abnormal   Collection Time: 04/03/24 12:05 PM  Result Value Ref Range Status    Enterococcus faecalis NOT DETECTED NOT DETECTED Final   Enterococcus Faecium NOT DETECTED NOT DETECTED Final   Listeria monocytogenes NOT DETECTED NOT DETECTED Final   Staphylococcus species DETECTED (A) NOT DETECTED Final    Comment: CRITICAL RESULT CALLED TO, READ BACK BY AND VERIFIED WITH: PHARMD STEVEN H on [id number] @1225  by SM    Staphylococcus aureus (BCID) NOT DETECTED NOT DETECTED Final   Staphylococcus epidermidis NOT DETECTED NOT DETECTED Final   Staphylococcus lugdunensis NOT DETECTED NOT DETECTED Final   Streptococcus species NOT DETECTED NOT DETECTED Final   Streptococcus agalactiae NOT DETECTED NOT DETECTED Final   Streptococcus pneumoniae NOT DETECTED NOT DETECTED Final   Streptococcus pyogenes NOT DETECTED NOT DETECTED Final   A.calcoaceticus-baumannii NOT DETECTED NOT DETECTED Final   Bacteroides fragilis NOT DETECTED NOT DETECTED Final   Enterobacterales NOT DETECTED NOT DETECTED Final   Enterobacter cloacae complex NOT DETECTED NOT DETECTED Final   Escherichia coli NOT DETECTED NOT DETECTED Final   Klebsiella aerogenes NOT DETECTED NOT DETECTED Final   Klebsiella oxytoca NOT DETECTED NOT DETECTED Final   Klebsiella pneumoniae NOT DETECTED NOT DETECTED Final   Proteus species NOT DETECTED NOT DETECTED Final   Salmonella species NOT DETECTED NOT DETECTED Final   Serratia marcescens NOT DETECTED NOT DETECTED Final   Haemophilus influenzae NOT DETECTED NOT DETECTED Final   Neisseria meningitidis NOT DETECTED NOT DETECTED Final   Pseudomonas aeruginosa NOT DETECTED NOT DETECTED Final   Stenotrophomonas maltophilia NOT DETECTED NOT DETECTED Final   Candida albicans NOT DETECTED NOT DETECTED Final   Candida auris NOT DETECTED NOT DETECTED Final   Candida glabrata NOT DETECTED NOT DETECTED Final   Candida krusei NOT DETECTED NOT DETECTED Final   Candida parapsilosis NOT DETECTED NOT DETECTED Final   Candida tropicalis NOT DETECTED NOT DETECTED Final   Cryptococcus  neoformans/gattii NOT DETECTED NOT DETECTED Final    Comment: Performed at [facility], [address], [place], [place] [address]  Urine Culture  Status: Abnormal   Collection Time: 04/03/24  2:00 PM   Specimen: Urine, Clean Catch  Result Value Ref Range Status   Specimen Description   Final    URINE, CLEAN CATCH Performed at [facility], [address] [place], [place] [address]    Special Requests   Final    NONE Performed at [facility], [address] [place], [place] [address]    Culture 80,000 COLONIES/mL ENTEROCOCCUS FAECALIS (A)  Final   Report Status 04/05/2024 FINAL  Final   Organism ID, Bacteria ENTEROCOCCUS FAECALIS (A)  Final      Susceptibility   Enterococcus faecalis - MIC*    AMPICILLIN  <=2 SENSITIVE Sensitive     NITROFURANTOIN <=16 SENSITIVE Sensitive     VANCOMYCIN 1 SENSITIVE Sensitive     * 80,000 COLONIES/mL ENTEROCOCCUS FAECALIS  Culture, blood (Routine X 2) w Reflex to ID Panel     Status: None   Collection Time: 04/05/24  1:39 PM   Specimen: BLOOD  Result Value Ref Range Status   Specimen Description BLOOD RIGHT ANTECUBITAL  Final   Special Requests   Final    BOTTLES DRAWN AEROBIC AND ANAEROBIC Blood Culture adequate volume   Culture   Final    NO GROWTH 5 DAYS Performed at [facility], [address], [place], [place] [address]    Report Status 04/10/2024 FINAL  Final  Culture, blood (Routine X 2) w Reflex to ID Panel     Status: None   Collection Time: 04/05/24  1:40 PM   Specimen: BLOOD RIGHT HAND  Result Value Ref Range Status   Specimen Description BLOOD RIGHT HAND  Final   Special Requests   Final    BOTTLES DRAWN AEROBIC ONLY Blood Culture results may not be optimal due to an inadequate volume of blood received in culture bottles   Culture   Final    NO GROWTH 5 DAYS Performed at [facility], [address] [place], [place] [address]    Report Status 04/10/2024 FINAL  Final  MRSA Next Gen by PCR, Nasal     Status: Abnormal    Collection Time: 04/11/24  9:21 PM   Specimen: Nasal Mucosa; Nasal Swab  Result Value Ref Range Status   MRSA by PCR Next Gen DETECTED (A) NOT DETECTED Final    Comment: RESULT CALLED TO, READ BACK BY AND VERIFIED WITH: E VANDYCK RN 04/12/2024 @ 0005 BY AB (NOTE) The GeneXpert MRSA Assay (FDA approved for NASAL specimens only), is one component of a comprehensive MRSA colonization surveillance program. It is not intended to diagnose MRSA infection nor to guide or monitor treatment for MRSA infections. Test performance is not FDA approved in patients less than 33 years old. Performed at [facility], [address] [place], [place] [address]   C Difficile Quick Screen w PCR reflex     Status: Abnormal   Collection Time: 04/12/24  8:02 AM   Specimen: STOOL  Result Value Ref Range Status   C Diff antigen POSITIVE (A) NEGATIVE Final   C Diff toxin NEGATIVE NEGATIVE Final   C Diff interpretation Results are indeterminate. See PCR results.  Final    Comment: Performed at [facility], [address] [place], [place] [address]         Radiology Studies: DG Chest Port 1 View Result Date: 04/11/2024 CLINICAL DATA:  Short of breath EXAM: PORTABLE CHEST 1 VIEW COMPARISON:  04/05/2024 FINDINGS: Single frontal view of the chest  demonstrates stable enlargement of the cardiac silhouette. Continued pulmonary vascular congestion, with mild background interstitial prominence and patchy basilar consolidation concerning for developing edema. Trace right effusion. No pneumothorax. IMPRESSION: 1. Mild congestive heart failure, with no significant change in volume status since prior study. Electronically Signed   By: Bobbye Burrow M.D.   On: 04/11/2024 22:00        Scheduled Meds:  apixaban   5 mg Oral BID   atorvastatin   40 mg Oral QHS   budesonide  (PULMICORT ) nebulizer solution  0.25 mg Nebulization BID   ferrous sulfate   325 mg Oral Once per day on Monday Wednesday Friday    furosemide   20 mg Oral Daily   gabapentin   100 mg Oral BID   insulin  aspart  0-9 Units Subcutaneous TID WC   insulin  regular human CONCENTRATED  30 Units Subcutaneous TID WC   ipratropium-albuterol   3 mL Nebulization Q4H   rOPINIRole   1 mg Oral QHS   tamsulosin   0.4 mg Oral QHS   Continuous Infusions:  ceFEPime (MAXIPIME) IV 2 g (04/12/24 1139)   [START ON 04/14/2024] vancomycin       LOS: 1 day   \    Seena Dadds, MD [facility]   To contact the attending provider between 7A-7P or the covering provider during after hours 7P-7A, please log into the web site [email or web address] and access using universal [place] password for that web site. If you do not have the password, please call the hospital operator.  04/12/2024, 12:02 PM

## 2024-04-12 NOTE — Evaluation (Signed)
 Physical Therapy Evaluation Patient Details Name: Ricky Johnston Sr. MRN: [id number] DOB: Jan 20, 1951 Today's Date: 04/12/2024  History of Present Illness  Pt is a 73 y/o M admitted on 04/11/24 after being found unresponsive at rehab.  Pt recently admitted to hospital & treated for sepsis 2/2 UTI & d/c on 04/09/24. Pt is currently being treated for acute respiratory failure with hypoxia & hypercapnia, possible PNA. PMH: COPD, a-fib, chronic HFrEF, obesity, sleep apnea, CKD 3, anemia, bladder CA, pulmonary fibrosis  Clinical Impression  Pt seen for PT evaluation with pt agreeable, wife present during session. Pt requires mod assist for supine>sit, +2 for sit>supine 2/2 lightheadedness. Pt tolerates sitting EOB ~2 minutes but c/o lightheadedness & "seeing spots" so further activity deferred, nurse called to room & made aware. Pt would benefit from continued skilled PT treatment to progress mobility as able.  BP checked in RUE: Semi fowler in bed: 134/67 mmHg MAP 90 Sitting EOB: 114/86 mmHg MAP 94 Supine in bed: 125/65 mmHg MAP 84        If plan is discharge home, recommend the following: Two people to help with walking and/or transfers;Two people to help with bathing/dressing/bathroom   Can travel by private vehicle        Equipment Recommendations Other (comment) (defer to next venue)  Recommendations for Other Services  Rehab consult    Functional Status Assessment Patient has had a recent decline in their functional status and demonstrates the ability to make significant improvements in function in a reasonable and predictable amount of time.     Precautions / Restrictions Precautions Precautions: Fall Precaution/Restrictions Comments: watch BP Restrictions Weight Bearing Restrictions Per Provider Order: No      Mobility  Bed Mobility Overal bed mobility: Needs Assistance Bed Mobility: Supine to Sit, Sit to Supine     Supine to sit: Mod assist, HOB elevated, Used rails Sit  to supine: +2 for physical assistance, Max assist   General bed mobility comments: supine>sit on L side of bed, HOB elevated, bed rails    Transfers                        Ambulation/Gait                  Stairs            Wheelchair Mobility     Tilt Bed    Modified Rankin (Stroke Patients Only)       Balance Overall balance assessment: Needs assistance Sitting-balance support: Feet supported, Bilateral upper extremity supported Sitting balance-Leahy Scale: Fair Sitting balance - Comments: CGA static sitting                                     Pertinent Vitals/Pain Pain Assessment Pain Assessment: No/denies pain    Home Living Family/patient expects to be discharged to:: Private residence Living Arrangements: Spouse/significant other Available Help at Discharge: Family;Available 24 hours/day Type of Home: House Home Access: Ramped entrance       Home Layout: One level Home Equipment: Rollator (4 wheels);Cane - single point;Shower seat;Grab bars - toilet;Grab bars - tub/shower;Wheelchair - manual;Hospital bed;Adaptive equipment;Other (comment);Lift chair Additional Comments: 2L O2 at baseline, was recently receiving HHPT services    Prior Function Prior Level of Function : Needs assist             Mobility Comments: Ambulatory with rollator, mod  I with bed mobility in hospital bed, transferred without assistance from lift chair, 3 falls in the past 6 months ADLs Comments: bathed & dressed himself, wife does the cooking & cleaning     Extremity/Trunk Assessment   Upper Extremity Assessment Upper Extremity Assessment: Generalized weakness    Lower Extremity Assessment Lower Extremity Assessment: Generalized weakness       Communication   Communication Communication: No apparent difficulties    Cognition Arousal: Alert Behavior During Therapy: WFL for tasks assessed/performed   PT - Cognitive impairments:  Orientation, Awareness, Initiation, Problem solving, Safety/Judgement, Attention                       PT - Cognition Comments: oriented to self only Following commands: Impaired Following commands impaired: Follows one step commands with increased time, Follows one step commands inconsistently     Cueing Cueing Techniques: Verbal cues, Visual cues     General Comments General comments (skin integrity, edema, etc.): Pt on 2L/min O2 during session, lowest SpO2 reading of ~85% but poor pleth waveform, able to increase to >/= 90% with seated rest, cuing for pursed lip breathing.    Exercises     Assessment/Plan    PT Assessment Patient needs continued PT services  PT Problem List Cardiopulmonary status limiting activity;Decreased strength;Decreased range of motion;Decreased cognition;Decreased activity tolerance;Decreased mobility;Decreased balance;Decreased knowledge of use of DME;Decreased safety awareness       PT Treatment Interventions DME instruction;Gait training;Functional mobility training;Therapeutic activities;Therapeutic exercise;Balance training;Patient/family education;Modalities;Neuromuscular re-education;Stair training;Manual techniques;Cognitive remediation    PT Goals (Current goals can be found in the Care Plan section)  Acute Rehab PT Goals Patient Stated Goal: get better PT Goal Formulation: With patient/family Time For Goal Achievement: 04/26/24 Potential to Achieve Goals: Good    Frequency Min 3X/week     Co-evaluation               AM-PAC PT "6 Clicks" Mobility  Outcome Measure Help needed turning from your back to your side while in a flat bed without using bedrails?: A Lot Help needed moving from lying on your back to sitting on the side of a flat bed without using bedrails?: Total Help needed moving to and from a bed to a chair (including a wheelchair)?: Total Help needed standing up from a chair using your arms (e.g., wheelchair or  bedside chair)?: Total Help needed to walk in hospital room?: Total Help needed climbing 3-5 steps with a railing? : Total 6 Click Score: 7    End of Session Equipment Utilized During Treatment: Oxygen Activity Tolerance:  (limited 2/2 lightheadedness sitting EOB) Patient left: in bed;with call bell/phone within reach;with bed alarm set;with nursing/sitter in room;with family/visitor present Nurse Communication: Mobility status PT Visit Diagnosis: Difficulty in walking, not elsewhere classified (R26.2);Muscle weakness (generalized) (M62.81);Other abnormalities of gait and mobility (R26.89)    Time: 1610-9604 PT Time Calculation (min) (ACUTE ONLY): 29 min   Charges:   PT Evaluation $PT Eval Moderate Complexity: 1 Mod   PT General Charges $$ ACUTE PT VISIT: 1 Visit         Emaline Handsome, PT, DPT 04/12/24, 4:23 PM   Venetta Gill 04/12/2024, 4:22 PM

## 2024-04-12 NOTE — Inpatient Diabetes Management (Signed)
 Inpatient Diabetes Program Recommendations  AACE/ADA: New Consensus Statement on Inpatient Glycemic Control  Target Ranges:  Prepandial:   less than 140 mg/dL      Peak postprandial:   less than 180 mg/dL (1-2 hours)      Critically ill patients:  140 - 180 mg/dL    Latest Reference Range & Units 04/12/24 08:54  Glucose-Capillary 70 - 99 mg/dL 161 (H)    Latest Reference Range & Units 04/11/24 21:32 04/12/24 05:50  Glucose 70 - 99 mg/dL 096 (H) 045 (H)   Review of Glycemic Control  Diabetes history: DM2 Outpatient Diabetes medications: Humulin R  U500 30 units TID; Mounjaro 12.5 mg Qweek (Sunday), Dexcom G7 Current orders for Inpatient glycemic control: Humulin R  U500 30 units with breakfast, 30 units with lunch, 30 units with supper, Novolog  0-9 units TID with meals   Inpatient Diabetes Program Recommendations:     Insulin : Please consider decreasing Humulin R  U500 to 15 units TID with meals.   NOTE: In reviewing chart, noted patient had telephone visit with C. Kovalick, PA-C at [facility] on 04/02/24. Per office note patient was asked to:  CONTINUE tirzepatide 12.5mg  weekly  - INCREASE U500 to 65 units w/ breaskfast - INCREASE U500 to 85 units w/ lunch - TAKE U500 20 units with supper  * if glucose >250 take 30 units * if not eating take 10 units    Patient was inpatient 04/03/24-04/09/24 and inpatient diabetes coordinator spoke with patient on 04/04/24. When patient was discharged on 04/09/24 he was asked to decrease Humulin R  U500 to 30 units TID with meals.  Thanks, Beacher Limerick, RN, MSN, CDCES Diabetes Coordinator Inpatient Diabetes Program [phone] (Team Pager from 8am to 5pm)

## 2024-04-12 NOTE — Progress Notes (Signed)
*  PRELIMINARY RESULTS* Echocardiogram 2D Echocardiogram has been performed.  Ricky Johnston 04/12/2024, 11:34 AM

## 2024-04-12 NOTE — Progress Notes (Signed)
 Routine EEG completed, results pending Neurology review and interpretation

## 2024-04-12 NOTE — Progress Notes (Signed)
 Pharmacy Antibiotic Note  Ricky Johnston. is a 73 y.o. male admitted on 04/11/2024 with SOB/PNA, possible sepsis.  Pharmacy has been consulted for Vancomycin and Cefepime  dosing.  Plan: Vancomycin 2000 mg IV now, then 1500 mg IV q48h Cefepime 2 g IV q12h  Height: 5\' 8"  (172.7 cm) Weight: 115.1 kg (253 lb 12 oz) IBW/kg (Calculated) : 68.4  Temp (24hrs), Avg:98.5 F (36.9 C), Min:97.7 F (36.5 C), Max:98.9 F (37.2 C)  Recent Labs  Lab 04/05/24 0609 04/06/24 0540 04/07/24 0626 04/08/24 0559 04/08/24 0652 04/09/24 0448 04/11/24 2132  WBC 4.6 2.9* 3.9*  --  4.2  --  5.7  CREATININE 2.66* 2.82*  --  3.26*  --  3.00* 2.36*    Estimated Creatinine Clearance: 34.3 mL/min (A) (by C-G formula based on SCr of 2.36 mg/dL (H)).    Allergies  Allergen Reactions   Empagliflozin  Other (See Comments)    Urinary tract infectious disease   Poison Ivy Extract [Poison [facility] Extract] Rash   Poison Oak Extract Rash    Carlota Chestnut 04/12/2024 1:12 AM

## 2024-04-13 DIAGNOSIS — J9601 Acute respiratory failure with hypoxia: Secondary | ICD-10-CM | POA: Diagnosis not present

## 2024-04-13 DIAGNOSIS — J9602 Acute respiratory failure with hypercapnia: Secondary | ICD-10-CM | POA: Diagnosis not present

## 2024-04-13 LAB — BASIC METABOLIC PANEL WITH GFR
Anion gap: 10 (ref 5–15)
BUN: 58 mg/dL — ABNORMAL HIGH (ref 8–23)
CO2: 25 mmol/L (ref 22–32)
Calcium: 8.3 mg/dL — ABNORMAL LOW (ref 8.9–10.3)
Chloride: 103 mmol/L (ref 98–111)
Creatinine, Ser: 2.1 mg/dL — ABNORMAL HIGH (ref 0.61–1.24)
GFR, Estimated: 33 mL/min — ABNORMAL LOW (ref >60)
Glucose, Bld: 185 mg/dL — ABNORMAL HIGH (ref 70–99)
Potassium: 4.9 mmol/L (ref 3.5–5.1)
Sodium: 138 mmol/L (ref 135–145)

## 2024-04-13 LAB — GLUCOSE, CAPILLARY
Glucose-Capillary: 237 mg/dL — ABNORMAL HIGH (ref 70–99)
Glucose-Capillary: 205 mg/dL — ABNORMAL HIGH (ref 70–99)
Glucose-Capillary: 192 mg/dL — ABNORMAL HIGH (ref 70–99)
Glucose-Capillary: 168 mg/dL — ABNORMAL HIGH (ref 70–99)
Glucose-Capillary: 179 mg/dL — ABNORMAL HIGH (ref 70–99)
Glucose-Capillary: 184 mg/dL — ABNORMAL HIGH (ref 70–99)

## 2024-04-13 LAB — CBC
HCT: 34.7 % — ABNORMAL LOW (ref 39.0–52.0)
Hemoglobin: 10.3 g/dL — ABNORMAL LOW (ref 13.0–17.0)
MCH: 28 pg (ref 26.0–34.0)
MCHC: 29.7 g/dL — ABNORMAL LOW (ref 30.0–36.0)
MCV: 94.3 fL (ref 80.0–100.0)
Platelets: 161 10*3/uL (ref 150–400)
RBC: 3.68 MIL/uL — ABNORMAL LOW (ref 4.22–5.81)
RDW: 14.2 % (ref 11.5–15.5)
WBC: 4.2 10*3/uL (ref 4.0–10.5)
nRBC: 0 % (ref 0.0–0.2)

## 2024-04-13 MED ORDER — METRONIDAZOLE 500 MG/100ML IV SOLN
500.0000 mg | Freq: Two times a day (BID) | INTRAVENOUS | Status: DC
  Administered 2024-04-13 – 2024-04-15 (×5): 500 mg via INTRAVENOUS
  Filled 2024-04-13 (×5): qty 100

## 2024-04-13 MED ORDER — SODIUM CHLORIDE 0.9 % IV SOLN
2.0000 g | INTRAVENOUS | Status: DC
  Administered 2024-04-13 – 2024-04-15 (×3): 2 g via INTRAVENOUS
  Filled 2024-04-13 (×3): qty 20

## 2024-04-13 NOTE — Progress Notes (Signed)
 PROGRESS NOTE    Ricky Johnston  FAO:[id number] DOB: Nov 21, 1951 DOA: 04/11/2024 PCP: [facility]    No chief complaint on file.   Brief Narrative:  Ricky MALACARA Sr. is a 73 y.o. male with history of COPD, atrial fibrillation, chronic HFrEF, obesity, sleep apnea, chronic kidney disease stage III, anemia who was recently admitted to [facility] and discharged on 04/09/2024 after being treated for sepsis secondary to UTI and patient is also being treated for bladder cancer was found unresponsive at the rehab and was brought to the ER at [facility].  Patient is chronically on BiPAP, but it does appear he has not been using it in the SNF,  ED workup including ABG showed pH of 7.23 with PCO2 of 75.  Patient was was placed on BiPAP.  Chest x-ray showed some congestion and CT chest abdomen pelvis showed diffuse infiltrates concerning for pulmonary edema versus atypical infection.  Patient was given Lasix  20 mg IV and started on empiric antibiotics for pneumonia.  Repeat ABG showed pH of 7.36 pCO2 58 patient became more alert awake.  CT of the head shows nonspecific hypoattenuation in the left medial temporal lobe.  MRI brain was recommended, he was admitted for further workup.   Assessment & Plan:   Principal Problem:   Acute respiratory failure with hypoxia and hypercapnia (HCC) Active Problems:   COPD (chronic obstructive pulmonary disease) (HCC)   Chronic systolic heart failure (HCC)   HTN (hypertension)   CKD stage 3a, GFR 45-59 ml/min (HCC)   BPH (benign prostatic hyperplasia)   Hyperlipidemia associated with type 2 diabetes mellitus (HCC)   OSA (obstructive sleep apnea)   Anemia   Bladder cancer (HCC)   Acute and chronic respiratory failure with hypercapnia (HCC)   A-fib (HCC)   Unresponsive episode   Acute renal failure (ARF) (HCC)     Acute on chronic respiratory failure with hypoxia and hypercapnia  -likely a combination of COPD and  decompensation of OSA.  Apparently he has not been using his BiPAP at the facility . -He was kept on BiPAP overnight, with significant improvement of mentation. -He was encouraged to use incentive spirometry and flutter valve  Acute metabolic encephalopathy/brief episode of unresponsiveness -Likely due to CO2 retention, improved on BiPAP - Normal finding in CT head, follow an MRI brain - EEG with no evidence of elliptic form abnormalities, only significant for mild diffuse slowing indicative of global cerebral dysfunction -MRI brain with no acute findings  COPD -No active wheezing-Home Pulmicort  and as needed nebs  pneumonia  - CT chest done at [facility], with evidence of groundglass attenuation and scattered pattern, and scant bronchogram consolidation of the right lower lobe, which may be concerning for aspiration, very likely due to to his encephalopathic/unresponsive event - Will narrow antibiotic to Rocephin  and Flagyl  Chronic HFrEF  last EF measured was 50 to 55% with reduced RV function on June 18, 2023.  Did receive 1 dose of Lasix  20 mg IV in ED  - Appears euvolemic, continue with home dose Lasix  - EF is 50%  - History of A-fib - on Eliquis .  History of bladder cancer - undergoing therapy.  Chronic kidney disease stage III  -creatinine is slightly better from recent past when patient was discharged on 04/09/2024 but has been progressively worsening since last July 2024.  CT scan did show some abnormality in the right ureter.  Patient is undergoing therapy for bladder cancer will need  follow-up with urologist. -Avoid nephrotoxic medication, will monitor closely does appear he received IV contrast at [facility]  Abnormality in the rectosigmoid area on CAT scan - done at [facility] will need follow-up as outpatient with gastroenterologist.  Chronic anemia  -follow CBC.  Indication for transfusion  Diabetes mellitus type 2 - on concentrated insulin .  Last hemoglobin A1c  was 7.8  nine days ago, with insulin  sliding scale, will decrease U-500 from 30 units 3 times daily to 15 units 3 times daily  BPH  -on tamsulosin .  Neuropathy  -on gabapentin .  Diarrhea - Much improved, C. difficile antigen is positive, but toxin is negative, no acute see definite infection, no indication to treat especially with no leukocytosis or abdominal pain.     Deconditioning -Consult PT, OT   DVT prophylaxis: Eliquis  Eliquis  Code Status: Full Family Communication: None at bedside Disposition:   Status is: Inpatient    Consultants:  none   Subjective:  He denies any complaints this morning, reports appetite has improved, no further diarrhea  Objective: Vitals:   04/13/24 0353 04/13/24 0730 04/13/24 0821 04/13/24 0829  BP: 125/64  121/62   Pulse:  (!) 59 75 76  Resp:  (!) 26 (!) 26 (!) 22  Temp: 98.7 F (37.1 C)  98.1 F (36.7 C)   TempSrc: Axillary  Oral   SpO2:  94% 95% 94%  Weight:      Height:        Intake/Output Summary (Last 24 hours) at 04/13/2024 1047 Last data filed at 04/13/2024 1022 Gross per 24 hour  Intake 1407 ml  Output 2750 ml  Net -1343 ml   Filed Weights   04/11/24 2136  Weight: 115.1 kg    Examination:  Awake Alert, Oriented X 3, limited frail, deconditioned Symmetrical Chest wall movement, Good air movement bilaterally RRR,No Gallops,Rubs or new Murmurs, No Parasternal Heave +ve B.Sounds, Abd Soft, No tenderness, No rebound - guarding or rigidity. No Cyanosis, Clubbing or edema, No new Rash or bruise       Data Reviewed: I have personally reviewed following labs and imaging studies  CBC: Recent Labs  Lab 04/07/24 0626 04/08/24 0652 04/11/24 2132 04/12/24 0550 04/13/24 0428  WBC 3.9* 4.2 5.7 4.9 4.2  NEUTROABS  --   --  4.5  --   --   HGB 10.7* 10.2* 10.8* 10.3* 10.3*  HCT 36.2* 35.5* 35.3* 34.0* 34.7*  MCV 97.8 96.2 92.9 93.4 94.3  PLT 128* 137* 178 183 161    Basic Metabolic Panel: Recent Labs  Lab  04/08/24 0559 04/09/24 0448 04/11/24 2132 04/12/24 0550 04/13/24 0428  NA 140 142 138 140 138  K 5.2* 5.0 4.8 4.7 4.9  CL 106 106 101 104 103  CO2 24 29 28 27 25   GLUCOSE 101* 76 141* 142* 185*  BUN 72* 71* 65* 65* 58*  CREATININE 3.26* 3.00* 2.36* 2.23* 2.10*  CALCIUM  8.7* 8.8* 8.6* 8.2* 8.3*    GFR: Estimated Creatinine Clearance: 38.6 mL/min (A) (by C-G formula based on SCr of 2.1 mg/dL (H)).  Liver Function Tests: Recent Labs  Lab 04/11/24 2132  AST 19  ALT 14  ALKPHOS 62  BILITOT 0.8  PROT 6.6  ALBUMIN 2.3*    CBG: Recent Labs  Lab 04/12/24 0854 04/12/24 1138 04/12/24 1617 04/12/24 2121 04/13/24 0929  GLUCAP 153* 130* 144* 265* 237*     Recent Results (from the past 240 hours)  Culture, blood (routine x 2)  Status: None   Collection Time: 04/03/24 12:00 PM   Specimen: BLOOD  Result Value Ref Range Status   Specimen Description BLOOD LEFT ARM  Final   Special Requests   Final    BOTTLES DRAWN AEROBIC AND ANAEROBIC Blood Culture results may not be optimal due to an inadequate volume of blood received in culture bottles   Culture   Final    NO GROWTH 5 DAYS Performed at [facility], [address], [place], [place] [address]    Report Status 04/08/2024 FINAL  Final  Resp panel by RT-PCR (RSV, Flu A&B, Covid) Anterior Nasal Swab     Status: None   Collection Time: 04/03/24 12:00 PM   Specimen: Anterior Nasal Swab  Result Value Ref Range Status   SARS Coronavirus 2 by RT PCR NEGATIVE NEGATIVE Final    Comment: (NOTE) SARS-CoV-2 target nucleic acids are NOT DETECTED.  The SARS-CoV-2 RNA is generally detectable in upper respiratory specimens during the acute phase of infection. The lowest concentration of SARS-CoV-2 viral copies this assay can detect is 138 copies/mL. A negative result does not preclude SARS-Cov-2 infection and should not be used as the sole basis for treatment or other patient management decisions. A negative result may occur  with  improper specimen collection/handling, submission of specimen other than nasopharyngeal swab, presence of viral mutation(s) within the areas targeted by this assay, and inadequate number of viral copies(<138 copies/mL). A negative result must be combined with clinical observations, patient history, and epidemiological information. The expected result is Negative.  Fact Sheet for Patients:  BloggerCourse.com  Fact Sheet for Healthcare Providers:  SeriousBroker.it  This test is no t yet approved or cleared by the [place]  FDA and  has been authorized for detection and/or diagnosis of SARS-CoV-2 by FDA under an Emergency Use Authorization (EUA). This EUA will remain  in effect (meaning this test can be used) for the duration of the COVID-19 declaration under Section 564(b)(1) of the Act, 21 U.S.C.section 360bbb-3(b)(1), unless the authorization is terminated  or revoked sooner.       Influenza A by PCR NEGATIVE NEGATIVE Final   Influenza B by PCR NEGATIVE NEGATIVE Final    Comment: (NOTE) The Xpert Xpress SARS-CoV-2/FLU/RSV plus assay is intended as an aid in the diagnosis of influenza from Nasopharyngeal swab specimens and should not be used as a sole basis for treatment. Nasal washings and aspirates are unacceptable for Xpert Xpress SARS-CoV-2/FLU/RSV testing.  Fact Sheet for Patients: BloggerCourse.com  Fact Sheet for Healthcare Providers: SeriousBroker.it  This test is not yet approved or cleared by the [place]  FDA and has been authorized for detection and/or diagnosis of SARS-CoV-2 by FDA under an Emergency Use Authorization (EUA). This EUA will remain in effect (meaning this test can be used) for the duration of the COVID-19 declaration under Section 564(b)(1) of the Act, 21 U.S.C. section 360bbb-3(b)(1), unless the authorization is terminated  or revoked.     Resp Syncytial Virus by PCR NEGATIVE NEGATIVE Final    Comment: (NOTE) Fact Sheet for Patients: BloggerCourse.com  Fact Sheet for Healthcare Providers: SeriousBroker.it  This test is not yet approved or cleared by the [place]  FDA and has been authorized for detection and/or diagnosis of SARS-CoV-2 by FDA under an Emergency Use Authorization (EUA). This EUA will remain in effect (meaning this test can be used) for the duration of the COVID-19 declaration under Section 564(b)(1) of the Act, 21 U.S.C. section 360bbb-3(b)(1), unless the authorization is terminated or revoked.  Performed at [facility], [address], [place], [place] [address]   Culture, blood (routine x 2)     Status: Abnormal   Collection Time: 04/03/24 12:05 PM   Specimen: BLOOD  Result Value Ref Range Status   Specimen Description   Final    BLOOD LEFT HAND Performed at [facility], [address], [place], [place] [address]    Special Requests   Final    BOTTLES DRAWN AEROBIC AND ANAEROBIC Blood Culture results may not be optimal due to an inadequate volume of blood received in culture bottles Performed at [facility], [address] [place], [place] [address]    Culture  Setup Time   Final    ANAEROBIC BOTTLE ONLY GRAM POSITIVE COCCI Gram Stain Report Called to,Read Back By and Verified With: L IRVING AT 0650 04/04/24 BY A WILSON CRITICAL RESULT CALLED TO, READ BACK BY AND VERIFIED WITH: PHARMD STEVEN H on [id number] @1225  by SM    Culture (A)  Final    STAPHYLOCOCCUS HOMINIS THE SIGNIFICANCE OF ISOLATING THIS ORGANISM FROM A SINGLE SET OF BLOOD CULTURES WHEN MULTIPLE SETS ARE DRAWN IS UNCERTAIN. PLEASE NOTIFY THE MICROBIOLOGY DEPARTMENT WITHIN ONE WEEK IF SPECIATION AND SENSITIVITIES ARE REQUIRED. Performed at [facility], [address], [name], [place] [address]    Report Status 04/06/2024 FINAL  Final  Blood Culture ID  Panel (Reflexed)     Status: Abnormal   Collection Time: 04/03/24 12:05 PM  Result Value Ref Range Status   Enterococcus faecalis NOT DETECTED NOT DETECTED Final   Enterococcus Faecium NOT DETECTED NOT DETECTED Final   Listeria monocytogenes NOT DETECTED NOT DETECTED Final   Staphylococcus species DETECTED (A) NOT DETECTED Final    Comment: CRITICAL RESULT CALLED TO, READ BACK BY AND VERIFIED WITH: PHARMD STEVEN H on [id number] @1225  by SM    Staphylococcus aureus (BCID) NOT DETECTED NOT DETECTED Final   Staphylococcus epidermidis NOT DETECTED NOT DETECTED Final   Staphylococcus lugdunensis NOT DETECTED NOT DETECTED Final   Streptococcus species NOT DETECTED NOT DETECTED Final   Streptococcus agalactiae NOT DETECTED NOT DETECTED Final   Streptococcus pneumoniae NOT DETECTED NOT DETECTED Final   Streptococcus pyogenes NOT DETECTED NOT DETECTED Final   A.calcoaceticus-baumannii NOT DETECTED NOT DETECTED Final   Bacteroides fragilis NOT DETECTED NOT DETECTED Final   Enterobacterales NOT DETECTED NOT DETECTED Final   Enterobacter cloacae complex NOT DETECTED NOT DETECTED Final   Escherichia coli NOT DETECTED NOT DETECTED Final   Klebsiella aerogenes NOT DETECTED NOT DETECTED Final   Klebsiella oxytoca NOT DETECTED NOT DETECTED Final   Klebsiella pneumoniae NOT DETECTED NOT DETECTED Final   Proteus species NOT DETECTED NOT DETECTED Final   Salmonella species NOT DETECTED NOT DETECTED Final   Serratia marcescens NOT DETECTED NOT DETECTED Final   Haemophilus influenzae NOT DETECTED NOT DETECTED Final   Neisseria meningitidis NOT DETECTED NOT DETECTED Final   Pseudomonas aeruginosa NOT DETECTED NOT DETECTED Final   Stenotrophomonas maltophilia NOT DETECTED NOT DETECTED Final   Candida albicans NOT DETECTED NOT DETECTED Final   Candida auris NOT DETECTED NOT DETECTED Final   Candida glabrata NOT DETECTED NOT DETECTED Final   Candida krusei NOT DETECTED NOT DETECTED Final   Candida parapsilosis  NOT DETECTED NOT DETECTED Final   Candida tropicalis NOT DETECTED NOT DETECTED Final   Cryptococcus neoformans/gattii NOT DETECTED NOT DETECTED Final    Comment: Performed at [facility], [address], [place], [place] [address]  Urine Culture  Status: Abnormal   Collection Time: 04/03/24  2:00 PM   Specimen: Urine, Clean Catch  Result Value Ref Range Status   Specimen Description   Final    URINE, CLEAN CATCH Performed at [facility], [address] [place], [place] [address]    Special Requests   Final    NONE Performed at [facility], [address], [place], [place] [address]    Culture 80,000 COLONIES/mL ENTEROCOCCUS FAECALIS (A)  Final   Report Status 04/05/2024 FINAL  Final   Organism ID, Bacteria ENTEROCOCCUS FAECALIS (A)  Final      Susceptibility   Enterococcus faecalis - MIC*    AMPICILLIN  <=2 SENSITIVE Sensitive     NITROFURANTOIN <=16 SENSITIVE Sensitive     VANCOMYCIN  1 SENSITIVE Sensitive     * 80,000 COLONIES/mL ENTEROCOCCUS FAECALIS  Culture, blood (Routine X 2) w Reflex to ID Panel     Status: None   Collection Time: 04/05/24  1:39 PM   Specimen: BLOOD  Result Value Ref Range Status   Specimen Description BLOOD RIGHT ANTECUBITAL  Final   Special Requests   Final    BOTTLES DRAWN AEROBIC AND ANAEROBIC Blood Culture adequate volume   Culture   Final    NO GROWTH 5 DAYS Performed at [facility], [address], [place], [place] [address]    Report Status 04/10/2024 FINAL  Final  Culture, blood (Routine X 2) w Reflex to ID Panel     Status: None   Collection Time: 04/05/24  1:40 PM   Specimen: BLOOD RIGHT HAND  Result Value Ref Range Status   Specimen Description BLOOD RIGHT HAND  Final   Special Requests   Final    BOTTLES DRAWN AEROBIC ONLY Blood Culture results may not be optimal due to an inadequate volume of blood received in culture bottles   Culture   Final    NO GROWTH 5 DAYS Performed at [facility], [address], [place],  [place] [address]    Report Status 04/10/2024 FINAL  Final  MRSA Next Gen by PCR, Nasal     Status: Abnormal   Collection Time: 04/11/24  9:21 PM   Specimen: Nasal Mucosa; Nasal Swab  Result Value Ref Range Status   MRSA by PCR Next Gen DETECTED (A) NOT DETECTED Final    Comment: RESULT CALLED TO, READ BACK BY AND VERIFIED WITH: E VANDYCK RN 04/12/2024 @ 0005 BY AB (NOTE) The GeneXpert MRSA Assay (FDA approved for NASAL specimens only), is one component of a comprehensive MRSA colonization surveillance program. It is not intended to diagnose MRSA infection nor to guide or monitor treatment for MRSA infections. Test performance is not FDA approved in patients less than 45 years old. Performed at [facility], [address], [place], [place] [address]   C Difficile Quick Screen w PCR reflex     Status: Abnormal   Collection Time: 04/12/24  8:02 AM   Specimen: STOOL  Result Value Ref Range Status   C Diff antigen POSITIVE (A) NEGATIVE Final   C Diff toxin NEGATIVE NEGATIVE Final   C Diff interpretation Results are indeterminate. See PCR results.  Final    Comment: Performed at [facility], [address] [place], [place] [address]  C. Diff by PCR, Reflexed     Status: None   Collection Time: 04/12/24  8:02 AM  Result Value Ref Range Status   Toxigenic C. Difficile by PCR NEGATIVE NEGATIVE Final  Comment: Patient is colonized with non toxigenic C. difficile. May not need treatment unless significant symptoms are present. Performed at [facility], [address] [place], [place] [address]          Radiology Studies: EEG adult Result Date: 04/12/2024 Eleni Griffin, MD     04/12/2024  5:57 PM Routine EEG Report Ricky Johnston. is a 73 y.o. male with a history of altered mental status who is undergoing an EEG to evaluate for seizures. Report: This EEG was acquired with electrodes placed according to the International 10-20 electrode system (including Fp1, Fp2,  F3, F4, C3, C4, P3, P4, O1, O2, T3, T4, T5, T6, A1, A2, Fz, Cz, Pz). The following electrodes were missing or displaced: none. The occipital dominant rhythm was 7 Hz. This activity is reactive to stimulation. Drowsiness was manifested by background fragmentation; deeper stages of sleep were identified by K complexes and sleep spindles. There was no focal slowing. There were no interictal epileptiform discharges. There were no electrographic seizures identified. There was no abnormal response to photic stimulation or hyperventilation. Impression and clinical correlation: This EEG was obtained while awake and asleep and is abnormal due to mild diffuse slowing indicative of global cerebral dysfunction. Epileptiform abnormalities were not seen during this recording. Greg Leaks, MD [facility] [phone] If 7pm- 7am, please page neurology on call as listed in AMION.   ECHOCARDIOGRAM COMPLETE Result Date: 04/12/2024    ECHOCARDIOGRAM REPORT   Patient Name:   Ricky PROCHNOW Sr. Date of Exam: 04/12/2024 Medical Rec #:  [id number]          Height:       68.0 in Accession #:    [id number]         Weight:       253.7 lb Date of Birth:  01/27/1951          BSA:          2.261 m Patient Age:    73 years           BP:           124/63 mmHg Patient Gender: M                  HR:           65 bpm. Exam Location:  Inpatient Procedure: 2D Echo, Color Doppler and Cardiac Doppler (Both Spectral and Color            Flow Doppler were utilized during procedure). Indications:    R55 Syncope  History:        Patient has prior history of Echocardiogram examinations, most                 recent 07/01/2023.  Sonographer:    Andrena Bang Referring Phys: Angelene Kelly IMPRESSIONS  1. Left ventricular ejection fraction, by estimation, is 50%. The left ventricle has mildly decreased function. Left ventricular endocardial border not optimally defined to evaluate regional wall motion. Left ventricular diastolic parameters are  indeterminate.  2. Right ventricular systolic function is normal. The right ventricular size is normal. Tricuspid regurgitation signal is inadequate for assessing PA pressure.  3. The mitral valve was not well visualized. No evidence of mitral valve regurgitation. No evidence of mitral stenosis.  4. The aortic valve is tricuspid. There is mild calcification of the aortic valve. Aortic valve regurgitation is not visualized. No aortic stenosis is present.  5. The inferior vena cava is  dilated in size with >50% respiratory variability, suggesting right atrial pressure of 8 mmHg.  6. Technically difficult study with very poor images. FINDINGS  Left Ventricle: Left ventricular ejection fraction, by estimation, is 50%. The left ventricle has mildly decreased function. Left ventricular endocardial border not optimally defined to evaluate regional wall motion. Definity  contrast agent was given IV  to delineate the left ventricular endocardial borders. The left ventricular internal cavity size was normal in size. There is no left ventricular hypertrophy. Left ventricular diastolic parameters are indeterminate. Right Ventricle: The right ventricular size is normal. Right vetricular wall thickness was not well visualized. Right ventricular systolic function is normal. Tricuspid regurgitation signal is inadequate for assessing PA pressure. Left Atrium: Left atrial size was normal in size. Right Atrium: Right atrial size was normal in size. Pericardium: There is no evidence of pericardial effusion. Mitral Valve: The mitral valve was not well visualized. No evidence of mitral valve regurgitation. No evidence of mitral valve stenosis. Tricuspid Valve: The tricuspid valve is not well visualized. Tricuspid valve regurgitation is not demonstrated. Aortic Valve: The aortic valve is tricuspid. There is mild calcification of the aortic valve. Aortic valve regurgitation is not visualized. No aortic stenosis is present. Aortic valve mean  gradient measures 5.0 mmHg. Aortic valve peak gradient measures 10.1 mmHg. Aortic valve area, by VTI measures 3.32 cm. Pulmonic Valve: The pulmonic valve was normal in structure. Pulmonic valve regurgitation is not visualized. Aorta: The aortic root is normal in size and structure. Venous: The inferior vena cava is dilated in size with greater than 50% respiratory variability, suggesting right atrial pressure of 8 mmHg. IAS/Shunts: The interatrial septum was not well visualized.  LEFT VENTRICLE PLAX 2D LVIDd:         6.10 cm      Diastology LVIDs:         4.00 cm      LV e' medial:    9.14 cm/s LV PW:         1.10 cm      LV E/e' medial:  12.1 LV IVS:        1.00 cm      LV e' lateral:   11.60 cm/s LVOT diam:     2.40 cm      LV E/e' lateral: 9.6 LV SV:         125 LV SV Index:   55 LVOT Area:     4.52 cm  LV Volumes (MOD) LV vol d, MOD A2C: 115.0 ml LV vol d, MOD A4C: 117.0 ml LV vol s, MOD A2C: 39.2 ml LV vol s, MOD A4C: 46.3 ml LV SV MOD A2C:     75.8 ml LV SV MOD A4C:     117.0 ml LV SV MOD BP:      76.6 ml RIGHT VENTRICLE RV S prime:     13.80 cm/s TAPSE (M-mode): 1.9 cm LEFT ATRIUM             Index LA Vol (A2C):   32.5 ml 14.37 ml/m LA Vol (A4C):   36.4 ml 16.10 ml/m LA Biplane Vol: 37.1 ml 16.41 ml/m  AORTIC VALVE AV Area (Vmax):    3.53 cm AV Area (Vmean):   2.94 cm AV Area (VTI):     3.32 cm AV Vmax:           159.00 cm/s AV Vmean:          102.000 cm/s AV VTI:  0.378 m AV Peak Grad:      10.1 mmHg AV Mean Grad:      5.0 mmHg LVOT Vmax:         124.00 cm/s LVOT Vmean:        66.200 cm/s LVOT VTI:          0.277 m LVOT/AV VTI ratio: 0.73  AORTA Ao Asc diam: 3.60 cm MITRAL VALVE MV Area (PHT): 2.51 cm     SHUNTS MV Decel Time: 302 msec     Systemic VTI:  0.28 m MV E velocity: 111.00 cm/s  Systemic Diam: 2.40 cm MV A velocity: 72.70 cm/s MV E/A ratio:  1.53 Dalton McleanMD Electronically signed by Archer Bear Signature Date/Time: 04/12/2024/1:44:55 PM    Final    MR BRAIN WO  CONTRAST Result Date: 04/12/2024 CLINICAL DATA:  Provided history: Neuro deficit, acute, stroke suspected. Additional history provided: Episode of unresponsiveness. EXAM: MRI HEAD WITHOUT CONTRAST TECHNIQUE: Multiplanar, multiecho pulse sequences of the brain and surrounding structures were obtained without intravenous contrast. COMPARISON:  Head CT 06/19/2023. FINDINGS: Brain: Generalized cerebral atrophy. Commensurate prominence of the ventricles and sulci. Multifocal T2 FLAIR hyperintense signal abnormality within the cerebral white matter and pons, nonspecific but compatible with moderate chronic small vessel ischemic disease. There are a few nonspecific punctate chronic microhemorrhages scattered within the supratentorial and infratentorial brain. No cortical encephalomalacia is identified. There is no acute infarct. No evidence of an intracranial mass. No chronic intracranial blood products. No extra-axial fluid collection. No midline shift. Vascular: Maintained flow voids within the proximal large arterial vessels. Skull and upper cervical spine: No focal worrisome lesion. Sinuses/Orbits: No mass or acute finding within the imaged orbits. Prior bilateral ocular lens replacement. Mild mucosal thickening within the right ethmoid and right sphenoid sinuses. IMPRESSION: 1.  No evidence of an acute intracranial abnormality. 2. Moderate chronic small vessel ischemic changes within the cerebral white matter and pons 3. Generalized cerebral atrophy. 4. Mild paranasal sinus mucosal thickening. Electronically Signed   By: Bascom Lily D.O.   On: 04/12/2024 13:33   DG Chest Port 1 View Result Date: 04/11/2024 CLINICAL DATA:  Short of breath EXAM: PORTABLE CHEST 1 VIEW COMPARISON:  04/05/2024 FINDINGS: Single frontal view of the chest demonstrates stable enlargement of the cardiac silhouette. Continued pulmonary vascular congestion, with mild background interstitial prominence and patchy basilar consolidation  concerning for developing edema. Trace right effusion. No pneumothorax. IMPRESSION: 1. Mild congestive heart failure, with no significant change in volume status since prior study. Electronically Signed   By: Bobbye Burrow M.D.   On: 04/11/2024 22:00        Scheduled Meds:  apixaban   5 mg Oral BID   atorvastatin   40 mg Oral QHS   budesonide  (PULMICORT ) nebulizer solution  0.25 mg Nebulization BID   Chlorhexidine  Gluconate Cloth  6 each Topical Daily   ferrous sulfate   325 mg Oral Once per day on Monday Wednesday Friday   furosemide   20 mg Oral Daily   gabapentin   100 mg Oral BID   insulin  aspart  0-9 Units Subcutaneous TID WC   insulin  regular human CONCENTRATED  15 Units Subcutaneous TID WC   ipratropium-albuterol   3 mL Nebulization TID   multivitamin with minerals  1 tablet Oral Daily   mupirocin  ointment  1 Application Nasal BID   Ensure Max Protein  11 oz Oral BID   rOPINIRole   1 mg Oral QHS   tamsulosin   0.4 mg Oral QHS   Continuous Infusions:  cefTRIAXone  (ROCEPHIN )  IV 2 g (04/13/24 0939)   metronidazole       LOS: 2 days   \    Seena Dadds, MD [facility]   To contact the attending provider between 7A-7P or the covering provider during after hours 7P-7A, please log into the web site [email or web address] and access using universal [place] password for that web site. If you do not have the password, please call the hospital operator.  04/13/2024, 10:47 AM

## 2024-04-13 NOTE — Plan of Care (Signed)
  Problem: Education: Goal: Knowledge of General Education information will improve Description: Including pain rating scale, medication(s)/side effects and non-pharmacologic comfort measures Outcome: Progressing   Problem: Health Behavior/Discharge Planning: Goal: Ability to manage health-related needs will improve Outcome: Progressing   Problem: Clinical Measurements: Goal: Ability to maintain clinical measurements within normal limits will improve Outcome: Progressing Goal: Will remain free from infection Outcome: Progressing Goal: Diagnostic test results will improve Outcome: Progressing Goal: Respiratory complications will improve Outcome: Progressing Goal: Cardiovascular complication will be avoided Outcome: Progressing   Problem: Activity: Goal: Risk for activity intolerance will decrease Outcome: Progressing   Problem: Nutrition: Goal: Adequate nutrition will be maintained Outcome: Progressing   Problem: Coping: Goal: Level of anxiety will decrease Outcome: Progressing   Problem: Elimination: Goal: Will not experience complications related to bowel motility Outcome: Progressing Goal: Will not experience complications related to urinary retention Outcome: Progressing   Problem: Pain Managment: Goal: General experience of comfort will improve and/or be controlled Outcome: Progressing   Problem: Safety: Goal: Ability to remain free from injury will improve Outcome: Progressing   Problem: Skin Integrity: Goal: Risk for impaired skin integrity will decrease Outcome: Progressing   Problem: Education: Goal: Ability to describe self-care measures that may prevent or decrease complications (Diabetes Survival Skills Education) will improve Outcome: Progressing Goal: Individualized Educational Video(s) Outcome: Progressing   Problem: Coping: Goal: Ability to adjust to condition or change in health will improve Outcome: Progressing   Problem: Fluid  Volume: Goal: Ability to maintain a balanced intake and output will improve Outcome: Progressing   Problem: Health Behavior/Discharge Planning: Goal: Ability to identify and utilize available resources and services will improve Outcome: Progressing Goal: Ability to manage health-related needs will improve Outcome: Progressing   Problem: Metabolic: Goal: Ability to maintain appropriate glucose levels will improve Outcome: Progressing   Problem: Nutritional: Goal: Maintenance of adequate nutrition will improve Outcome: Progressing Goal: Progress toward achieving an optimal weight will improve Outcome: Progressing   Problem: Skin Integrity: Goal: Risk for impaired skin integrity will decrease Outcome: Progressing   Problem: Tissue Perfusion: Goal: Adequacy of tissue perfusion will improve Outcome: Progressing   Problem: Education: Goal: Knowledge of disease or condition will improve Outcome: Progressing Goal: Knowledge of the prescribed therapeutic regimen will improve Outcome: Progressing Goal: Individualized Educational Video(s) Outcome: Progressing   Problem: Activity: Goal: Ability to tolerate increased activity will improve Outcome: Progressing Goal: Will verbalize the importance of balancing activity with adequate rest periods Outcome: Progressing   Problem: Respiratory: Goal: Ability to maintain a clear airway will improve Outcome: Progressing Goal: Levels of oxygenation will improve Outcome: Progressing Goal: Ability to maintain adequate ventilation will improve Outcome: Progressing

## 2024-04-13 NOTE — Progress Notes (Signed)
   04/13/24 1954  BiPAP/CPAP/SIPAP  BiPAP/CPAP/SIPAP Pt Type Adult  BiPAP/CPAP/SIPAP [name]  Mask Type Nasal pillows  Mask Size Large  Respiratory Rate 26 breaths/min  Patient Home Machine Yes  Safety Check Completed by RT for Home Unit Yes, no issues noted  Patient Home Mask Yes  Patient Home Tubing Yes  Auto Titrate Yes  BiPAP/CPAP /SiPAP Vitals  Bilateral Breath Sounds Clear;Diminished

## 2024-04-13 NOTE — Evaluation (Signed)
 Occupational Therapy Evaluation Patient Details Name: Ricky BAUMGARDNER Sr. MRN: [id number] DOB: 11-06-1951 Today's Date: 04/13/2024   History of Present Illness   Pt is a 73 y/o M admitted on 04/11/24 after being found unresponsive at rehab.  Pt recently admitted to hospital & treated for sepsis 2/2 UTI & d/c on 04/09/24. Pt is currently being treated for acute respiratory failure with hypoxia & hypercapnia, possible PNA. PMH: COPD, a-fib, chronic HFrEF, obesity, sleep apnea, CKD 3, anemia, bladder CA, pulmonary fibrosis     Clinical Impressions Patient lives with wife in a 1 level home with ramp entry.  At baseline, patient was using rollator for functional mobility with mod I, slow but able to complete, per wife's report. Patient  was Independent for ADLs and wife completed IADLS.  Patient currently requires max A x1 for supine to sit wit verbal and tactile cues for following directions.  Mod/max A for unsupported sitting 2.2 R lateral  lean and retropulsion.  Total A for LB ADLs.  Sit to supine max a x1 with verbal cues for proper technique of laying onto R side and not  backwards to prevent patient from hitting head on bed rail.  Patient would benefit from additional OT intervention to address functional deficits of overall strength, independence in ADLs, activity tolerance, sitting balance, fall prevention and functional mobility in order for patient to return to PLOF.   Of note, sitting EOB, aptient reported feeling dizzy, although systolically BP was 126 supine and 120 sitting EOB.     If plan is discharge home, recommend the following:   A lot of help with walking and/or transfers;A lot of help with bathing/dressing/bathroom;Assistance with cooking/housework;Assist for transportation;Help with stairs or ramp for entrance     Functional Status Assessment   Patient has had a recent decline in their functional status and demonstrates the ability to make significant improvements in function  in a reasonable and predictable amount of time.     Equipment Recommendations   None recommended by OT (defer to next level of care)     Recommendations for Other Services         Precautions/Restrictions   Precautions Precautions: Fall Recall of Precautions/Restrictions: Intact Precaution/Restrictions Comments: watch BP Restrictions Weight Bearing Restrictions Per Provider Order: No     Mobility Bed Mobility Overal bed mobility: Needs Assistance Bed Mobility: Supine to Sit, Sit to Supine     Supine to sit: Max assist, HOB elevated, Used rails Sit to supine: Max assist, HOB elevated, Used rails        Transfers                          Balance Overall balance assessment: Needs assistance Sitting-balance support: Feet supported, Bilateral upper extremity supported Sitting balance-Leahy Scale: Poor Sitting balance - Comments:  (Rlateral and retropulsion) Postural control: Posterior lean                                 ADL either performed or assessed with clinical judgement   ADL Overall ADL's : Needs assistance/impaired Eating/Feeding: Set up;Sitting   Grooming: Wash/dry face;Wash/dry hands;Minimal assistance   Upper Body Bathing: Sitting;Maximal assistance   Lower Body Bathing: Maximal assistance   Upper Body Dressing : Sitting;Maximal assistance   Lower Body Dressing: Total assistance   Toilet Transfer:  (unable to complete 2/2 R latral lean and retropulsion while seated EOB)  Vision Baseline Vision/History: 1 Wears glasses Patient Visual Report: No change from baseline Vision Assessment?: No apparent visual deficits     Perception         Praxis         Pertinent Vitals/Pain Pain Assessment Pain Assessment: 0-10     Extremity/Trunk Assessment Upper Extremity Assessment Upper Extremity Assessment: Generalized weakness   Lower Extremity Assessment Lower Extremity Assessment: Defer  to PT evaluation       Communication Communication Communication: No apparent difficulties   Cognition Arousal: Alert Behavior During Therapy: WFL for tasks assessed/performed Cognition: Cognition impaired     Awareness: Intellectual awareness impaired Memory impairment (select all impairments): Working memory Attention impairment (select first level of impairment): Selective attention                     Following commands: Impaired Following commands impaired: Follows one step commands with increased time, Follows one step commands inconsistently     Cueing  General Comments   Cueing Techniques: Verbal cues;Visual cues      Exercises     Shoulder Instructions      Home Living Family/patient expects to be discharged to:: Private residence Living Arrangements: Spouse/significant other Available Help at Discharge: Family;Available 24 hours/day Type of Home: House Home Access: Ramped entrance     Home Layout: One level     Bathroom Shower/Tub: Producer, television/film/video: Standard Bathroom Accessibility: Yes How Accessible: Accessible via walker Home Equipment: Rollator (4 wheels);Cane - single point;Shower seat;Grab bars - toilet;Grab bars - tub/shower;Wheelchair - manual;Hospital bed;Adaptive equipment;Other (comment);Lift chair Adaptive Equipment: Reacher;Sock aid Additional Comments: 2L O2 at baseline, was recently receiving HHPT services      Prior Functioning/Environment Prior Level of Function : Independent/Modified Independent           ADLs (physical): IADLs Mobility Comments: Ambulatory with rollator, mod I with bed mobility in hospital bed, transferred without assistance from lift chair, 3 falls in the past 6 months ADLs Comments: bathed & dressed himself, wife does the cooking & cleaning    OT Problem List: Decreased strength;Decreased activity tolerance;Decreased safety awareness;Decreased knowledge of use of DME or AE;Decreased  cognition   OT Treatment/Interventions: Self-care/ADL training;Therapeutic exercise;DME and/or AE instruction;Therapeutic activities;Patient/family education;Balance training      OT Goals(Current goals can be found in the care plan section)   Acute Rehab OT Goals OT Goal Formulation: With patient Time For Goal Achievement: 04/27/24 Potential to Achieve Goals: Good ADL Goals Pt Will Perform Grooming: sitting;with contact guard assist Pt Will Perform Upper Body Bathing: sitting;with min assist Pt Will Perform Lower Body Bathing: with mod assist;sitting/lateral leans Pt Will Perform Upper Body Dressing: with min assist Pt Will Perform Lower Body Dressing: with mod assist Pt Will Transfer to Toilet: with +2 assist;bedside commode;with mod assist   OT Frequency:  Min 1X/week    Co-evaluation              AM-PAC OT "6 Clicks" Daily Activity     Outcome Measure Help from another person eating meals?: A Little Help from another person taking care of personal grooming?: A Lot Help from another person toileting, which includes using toliet, bedpan, or urinal?: Total Help from another person bathing (including washing, rinsing, drying)?: A Lot Help from another person to put on and taking off regular upper body clothing?: A Lot Help from another person to put on and taking off regular lower body clothing?: Total 6 Click Score: 11  End of Session Nurse Communication: Mobility status  Activity Tolerance: Patient tolerated treatment well Patient left: in bed;with call bell/phone within reach;with bed alarm set;with family/visitor present  OT Visit Diagnosis: Unsteadiness on feet (R26.81);Other abnormalities of gait and mobility (R26.89);Muscle weakness (generalized) (M62.81)                Time: 1324-4010 OT Time Calculation (min): 31 min Charges:  OT General Charges $OT Visit: 1 Visit OT Evaluation $OT Eval Moderate Complexity: 1 Mod OT Treatments $Self Care/Home Management  : 8-22 mins  Lovett Ruck OT/L  Lacretia Piccolo 04/13/2024, 4:47 PM

## 2024-04-14 DIAGNOSIS — J9601 Acute respiratory failure with hypoxia: Secondary | ICD-10-CM | POA: Diagnosis not present

## 2024-04-14 DIAGNOSIS — J9602 Acute respiratory failure with hypercapnia: Secondary | ICD-10-CM | POA: Diagnosis not present

## 2024-04-14 LAB — CBC
HCT: 36.6 % — ABNORMAL LOW (ref 39.0–52.0)
Hemoglobin: 10.7 g/dL — ABNORMAL LOW (ref 13.0–17.0)
MCH: 27.2 pg (ref 26.0–34.0)
MCHC: 29.2 g/dL — ABNORMAL LOW (ref 30.0–36.0)
MCV: 92.9 fL (ref 80.0–100.0)
Platelets: 163 10*3/uL (ref 150–400)
RBC: 3.94 MIL/uL — ABNORMAL LOW (ref 4.22–5.81)
RDW: 14.1 % (ref 11.5–15.5)
WBC: 3.9 10*3/uL — ABNORMAL LOW (ref 4.0–10.5)
nRBC: 0 % (ref 0.0–0.2)

## 2024-04-14 LAB — GLUCOSE, CAPILLARY
Glucose-Capillary: 192 mg/dL — ABNORMAL HIGH (ref 70–99)
Glucose-Capillary: 191 mg/dL — ABNORMAL HIGH (ref 70–99)
Glucose-Capillary: 190 mg/dL — ABNORMAL HIGH (ref 70–99)
Glucose-Capillary: 190 mg/dL — ABNORMAL HIGH (ref 70–99)

## 2024-04-14 LAB — BASIC METABOLIC PANEL WITH GFR
Anion gap: 11 (ref 5–15)
BUN: 57 mg/dL — ABNORMAL HIGH (ref 8–23)
CO2: 25 mmol/L (ref 22–32)
Calcium: 8.6 mg/dL — ABNORMAL LOW (ref 8.9–10.3)
Chloride: 102 mmol/L (ref 98–111)
Creatinine, Ser: 2.19 mg/dL — ABNORMAL HIGH (ref 0.61–1.24)
GFR, Estimated: 31 mL/min — ABNORMAL LOW (ref >60)
Glucose, Bld: 180 mg/dL — ABNORMAL HIGH (ref 70–99)
Potassium: 4.7 mmol/L (ref 3.5–5.1)
Sodium: 138 mmol/L (ref 135–145)

## 2024-04-14 NOTE — Plan of Care (Signed)

## 2024-04-14 NOTE — Progress Notes (Signed)
 BBS essn clear though decd at bases. Change duonebs to PRN per RT protocol assessment.

## 2024-04-14 NOTE — Progress Notes (Signed)
 PROGRESS NOTE    Ricky Johnston  WUJ:[id number] DOB: Jul 16, 1951 DOA: 04/11/2024 PCP: [facility]    No chief complaint on file.   Brief Narrative:  Ricky LEGAN Sr. is a 73 y.o. male with history of COPD, atrial fibrillation, chronic HFrEF, obesity, sleep apnea, chronic kidney disease stage III, anemia who was recently admitted to [facility] and discharged on 04/09/2024 after being treated for sepsis secondary to UTI and patient is also being treated for bladder cancer was found unresponsive at the rehab and was brought to the ER at [facility].  Patient is chronically on BiPAP, but it does appear he has not been using it in the SNF,  ED workup including ABG showed pH of 7.23 with PCO2 of 75.  Patient was was placed on BiPAP.  Chest x-ray showed some congestion and CT chest abdomen pelvis showed diffuse infiltrates concerning for pulmonary edema versus atypical infection.  Patient was given Lasix  20 mg IV and started on empiric antibiotics for pneumonia.  Repeat ABG showed pH of 7.36 pCO2 58 patient became more alert awake.  CT of the head shows nonspecific hypoattenuation in the left medial temporal lobe.  MRI brain was recommended, he was admitted for further workup.   Assessment & Plan:   Principal Problem:   Acute respiratory failure with hypoxia and hypercapnia (HCC) Active Problems:   COPD (chronic obstructive pulmonary disease) (HCC)   Chronic systolic heart failure (HCC)   HTN (hypertension)   CKD stage 3a, GFR 45-59 ml/min (HCC)   BPH (benign prostatic hyperplasia)   Hyperlipidemia associated with type 2 diabetes mellitus (HCC)   OSA (obstructive sleep apnea)   Anemia   Bladder cancer (HCC)   Acute and chronic respiratory failure with hypercapnia (HCC)   A-fib (HCC)   Unresponsive episode   Acute renal failure (ARF) (HCC)     Acute on chronic respiratory failure with hypoxia and hypercapnia  -likely a combination of COPD and  decompensation of OSA.  Apparently he has not been using his BiPAP at the facility . -He was kept on BiPAP overnight, with significant improvement of mentation. -He was encouraged to use incentive spirometry and flutter valve  Acute metabolic encephalopathy/brief episode of unresponsiveness -Likely due to CO2 retention, improved on BiPAP - Abnormal finding in CT head, follow an MRI brain, it was obtained during this hospital stay with no acute findings. - EEG with no evidence of elliptic form abnormalities, only significant for mild diffuse slowing indicative of global cerebral dysfunction  COPD -No active wheezing-Home Pulmicort  and as needed nebs  pneumonia  - CT chest done at [facility], with evidence of groundglass attenuation and scattered pattern, and scant bronchogram consolidation of the right lower lobe, which may be concerning for aspiration, very likely due to to his encephalopathic/unresponsive event - Will narrow antibiotic to Rocephin  and Flagyl  Chronic HFrEF  last EF measured was 50 to 55% with reduced RV function on June 18, 2023.  Did receive 1 dose of Lasix  20 mg IV in ED  - Appears euvolemic, continue with home dose Lasix  - EF is 50%  - History of A-fib - on Eliquis .  History of bladder cancer - undergoing therapy.  [facility].  Chronic kidney disease stage III  -creatinine is slightly better from recent past when patient was discharged on 04/09/2024 but has been progressively worsening since last July 2024.  CT scan did show some abnormality in the  right ureter.  Patient is undergoing therapy for bladder cancer will need follow-up with urologist. -Avoid nephrotoxic medication, will monitor closely does appear he received IV contrast at [facility]  Abnormality in the rectosigmoid area on CAT scan - done at [facility] will need follow-up as outpatient with gastroenterologist.  But as discussed with wife patient had colonoscopy last year  at [facility], where he was told all within normal limit and colonoscopy in 10 years.  Chronic anemia  -follow CBC.  Indication for transfusion  Diabetes mellitus type 2 - on concentrated insulin .  Last hemoglobin A1c was 7.8  nine days ago, with insulin  sliding scale, will decrease U-500 from 30 units 3 times daily to 15 units 3 times daily  BPH  -on tamsulosin .  Neuropathy  -on gabapentin .  Diarrhea - Much improved, C. difficile antigen is positive, but toxin is negative, no evidence of active C. difficile infection, abdomen is nontender, benign, no leukocytosis, no fever, no indication to treat   Deconditioning -Consult PT, OT   DVT prophylaxis: Eliquis  Eliquis  Code Status: Full Family Communication: Discussed with wife at bedside Disposition:   Status is: Inpatient    Consultants:  none   Subjective:  He denies any complaints this morning, reports appetite has improved, no further diarrhea  Objective: Vitals:   04/13/24 2000 04/14/24 0002 04/14/24 0422 04/14/24 0735  BP: (!) 124/58 123/60 133/82   Pulse: 64 62 66 67  Resp: 20 20 (!) 23 (!) 24  Temp: 98.8 F (37.1 C) 97.7 F (36.5 C) (!) 96.9 F (36.1 C)   TempSrc: Oral Oral Oral   SpO2: 91% (!) 86% (!) 85% 92%  Weight:      Height:        Intake/Output Summary (Last 24 hours) at 04/14/2024 1143 Last data filed at 04/14/2024 0900 Gross per 24 hour  Intake 1467 ml  Output 2000 ml  Net -533 ml   Filed Weights   04/11/24 2136  Weight: 115.1 kg    Examination:  Awake Alert, Oriented X 3, frail, deconditioned Symmetrical Chest wall movement, Good air movement bilaterally, CTAB RRR,No Gallops,Rubs or new Murmurs, No Parasternal Heave +ve B.Sounds, Abd Soft, No tenderness, No rebound - guarding or rigidity. No Cyanosis, Clubbing or edema, No new Rash or bruise        Data Reviewed: I have personally reviewed following labs and imaging studies  CBC: Recent Labs  Lab 04/08/24 0652  04/11/24 2132 04/12/24 0550 04/13/24 0428 04/14/24 0617  WBC 4.2 5.7 4.9 4.2 3.9*  NEUTROABS  --  4.5  --   --   --   HGB 10.2* 10.8* 10.3* 10.3* 10.7*  HCT 35.5* 35.3* 34.0* 34.7* 36.6*  MCV 96.2 92.9 93.4 94.3 92.9  PLT 137* 178 183 161 163    Basic Metabolic Panel: Recent Labs  Lab 04/09/24 0448 04/11/24 2132 04/12/24 0550 04/13/24 0428 04/14/24 0617  NA 142 138 140 138 138  K 5.0 4.8 4.7 4.9 4.7  CL 106 101 104 103 102  CO2 29 28 27 25 25   GLUCOSE 76 141* 142* 185* 180*  BUN 71* 65* 65* 58* 57*  CREATININE 3.00* 2.36* 2.23* 2.10* 2.19*  CALCIUM  8.8* 8.6* 8.2* 8.3* 8.6*    GFR: Estimated Creatinine Clearance: 37 mL/min (A) (by C-G formula based on SCr of 2.19 mg/dL (H)).  Liver Function Tests: Recent Labs  Lab 04/11/24 2132  AST 19  ALT 14  ALKPHOS 62  BILITOT 0.8  PROT 6.6  ALBUMIN 2.3*    CBG: Recent Labs  Lab 04/13/24 1428 04/13/24 1619 04/13/24 1812 04/13/24 2145 04/14/24 0745  GLUCAP 192* 168* 179* 184* 192*     Recent Results (from the past 240 hours)  Culture, blood (Routine X 2) w Reflex to ID Panel     Status: None   Collection Time: 04/05/24  1:39 PM   Specimen: BLOOD  Result Value Ref Range Status   Specimen Description BLOOD RIGHT ANTECUBITAL  Final   Special Requests   Final    BOTTLES DRAWN AEROBIC AND ANAEROBIC Blood Culture adequate volume   Culture   Final    NO GROWTH 5 DAYS Performed at [facility], [address], [place], [place] [address]    Report Status 04/10/2024 FINAL  Final  Culture, blood (Routine X 2) w Reflex to ID Panel     Status: None   Collection Time: 04/05/24  1:40 PM   Specimen: BLOOD RIGHT HAND  Result Value Ref Range Status   Specimen Description BLOOD RIGHT HAND  Final   Special Requests   Final    BOTTLES DRAWN AEROBIC ONLY Blood Culture results may not be optimal due to an inadequate volume of blood received in culture bottles   Culture   Final    NO GROWTH 5 DAYS Performed at [facility], [address], [place], [place] [address]    Report Status 04/10/2024 FINAL  Final  MRSA Next Gen by PCR, Nasal     Status: Abnormal   Collection Time: 04/11/24  9:21 PM   Specimen: Nasal Mucosa; Nasal Swab  Result Value Ref Range Status   MRSA by PCR Next Gen DETECTED (A) NOT DETECTED Final    Comment: RESULT CALLED TO, READ BACK BY AND VERIFIED WITH: E VANDYCK RN 04/12/2024 @ 0005 BY AB (NOTE) The GeneXpert MRSA Assay (FDA approved for NASAL specimens only), is one component of a comprehensive MRSA colonization surveillance program. It is not intended to diagnose MRSA infection nor to guide or monitor treatment for MRSA infections. Test performance is not FDA approved in patients less than 5 years old. Performed at [facility], [address] [place], [place] [address]   C Difficile Quick Screen w PCR reflex     Status: Abnormal   Collection Time: 04/12/24  8:02 AM   Specimen: STOOL  Result Value Ref Range Status   C Diff antigen POSITIVE (A) NEGATIVE Final   C Diff toxin NEGATIVE NEGATIVE Final   C Diff interpretation Results are indeterminate. See PCR results.  Final    Comment: Performed at [facility], [address], [place], [place] [address]  C. Diff by PCR, Reflexed     Status: None   Collection Time: 04/12/24  8:02 AM  Result Value Ref Range Status   Toxigenic C. Difficile by PCR NEGATIVE NEGATIVE Final    Comment: Patient is colonized with non toxigenic C. difficile. May not need treatment unless significant symptoms are present. Performed at [facility], [address], [place], [place] [address]          Radiology Studies: EEG adult Result Date: 04/12/2024 Eleni Griffin, MD     04/12/2024  5:57 PM Routine EEG Report Dedric Ethington. is a 73 y.o. male with a history of altered mental status who is undergoing an EEG to evaluate for seizures. Report: This EEG was acquired with electrodes placed according to the International 10-20  electrode system (including  Fp1, Fp2, F3, F4, C3, C4, P3, P4, O1, O2, T3, T4, T5, T6, A1, A2, Fz, Cz, Pz). The following electrodes were missing or displaced: none. The occipital dominant rhythm was 7 Hz. This activity is reactive to stimulation. Drowsiness was manifested by background fragmentation; deeper stages of sleep were identified by K complexes and sleep spindles. There was no focal slowing. There were no interictal epileptiform discharges. There were no electrographic seizures identified. There was no abnormal response to photic stimulation or hyperventilation. Impression and clinical correlation: This EEG was obtained while awake and asleep and is abnormal due to mild diffuse slowing indicative of global cerebral dysfunction. Epileptiform abnormalities were not seen during this recording. Greg Leaks, MD [facility] [phone] If 7pm- 7am, please page neurology on call as listed in AMION.        Scheduled Meds:  apixaban   5 mg Oral BID   atorvastatin   40 mg Oral QHS   budesonide  (PULMICORT ) nebulizer solution  0.25 mg Nebulization BID   Chlorhexidine  Gluconate Cloth  6 each Topical Daily   ferrous sulfate   325 mg Oral Once per day on Monday Wednesday Friday   furosemide   20 mg Oral Daily   gabapentin   100 mg Oral BID   insulin  aspart  0-9 Units Subcutaneous TID WC   insulin  regular human CONCENTRATED  15 Units Subcutaneous TID WC   multivitamin with minerals  1 tablet Oral Daily   mupirocin  ointment  1 Application Nasal BID   Ensure Max Protein  11 oz Oral BID   rOPINIRole   1 mg Oral QHS   tamsulosin   0.4 mg Oral QHS   Continuous Infusions:  cefTRIAXone  (ROCEPHIN )  IV Stopped (04/14/24 0722)   metronidazole 500 mg (04/14/24 0846)     LOS: 3 days     Seena Dadds, MD [facility]   To contact the attending provider between 7A-7P or the covering provider during after hours 7P-7A, please log into the web site [email or web address] and access using  universal [place] password for that web site. If you do not have the password, please call the hospital operator.  04/14/2024, 11:43 AM

## 2024-04-15 ENCOUNTER — Inpatient Hospital Stay (HOSPITAL_COMMUNITY)

## 2024-04-15 DIAGNOSIS — J9602 Acute respiratory failure with hypercapnia: Secondary | ICD-10-CM | POA: Diagnosis not present

## 2024-04-15 DIAGNOSIS — J9601 Acute respiratory failure with hypoxia: Secondary | ICD-10-CM | POA: Diagnosis not present

## 2024-04-15 LAB — BLOOD GAS, ARTERIAL
Acid-Base Excess: 7.6 mmol/L — ABNORMAL HIGH (ref 0.0–2.0)
Bicarbonate: 34.5 mmol/L — ABNORMAL HIGH (ref 20.0–28.0)
Drawn by: 270221
O2 Saturation: 95.9 %
Patient temperature: 36.8
pCO2 arterial: 56 mmHg — ABNORMAL HIGH (ref 32–48)
pH, Arterial: 7.39 (ref 7.35–7.45)
pO2, Arterial: 64 mmHg — ABNORMAL LOW (ref 83–108)

## 2024-04-15 LAB — MAGNESIUM: Magnesium: 2.1 mg/dL (ref 1.7–2.4)

## 2024-04-15 LAB — BASIC METABOLIC PANEL WITH GFR
Anion gap: 11 (ref 5–15)
BUN: 56 mg/dL — ABNORMAL HIGH (ref 8–23)
CO2: 28 mmol/L (ref 22–32)
Calcium: 9.2 mg/dL (ref 8.9–10.3)
Chloride: 101 mmol/L (ref 98–111)
Creatinine, Ser: 2.19 mg/dL — ABNORMAL HIGH (ref 0.61–1.24)
GFR, Estimated: 31 mL/min — ABNORMAL LOW (ref >60)
Glucose, Bld: 185 mg/dL — ABNORMAL HIGH (ref 70–99)
Potassium: 4.9 mmol/L (ref 3.5–5.1)
Sodium: 140 mmol/L (ref 135–145)

## 2024-04-15 LAB — CBC
HCT: 36.8 % — ABNORMAL LOW (ref 39.0–52.0)
Hemoglobin: 10.6 g/dL — ABNORMAL LOW (ref 13.0–17.0)
MCH: 27.1 pg (ref 26.0–34.0)
MCHC: 28.8 g/dL — ABNORMAL LOW (ref 30.0–36.0)
MCV: 94.1 fL (ref 80.0–100.0)
Platelets: 169 10*3/uL (ref 150–400)
RBC: 3.91 MIL/uL — ABNORMAL LOW (ref 4.22–5.81)
RDW: 14.1 % (ref 11.5–15.5)
WBC: 3.5 10*3/uL — ABNORMAL LOW (ref 4.0–10.5)
nRBC: 0 % (ref 0.0–0.2)

## 2024-04-15 LAB — PROCALCITONIN: Procalcitonin: 1.67 ng/mL

## 2024-04-15 LAB — GLUCOSE, CAPILLARY
Glucose-Capillary: 194 mg/dL — ABNORMAL HIGH (ref 70–99)
Glucose-Capillary: 215 mg/dL — ABNORMAL HIGH (ref 70–99)
Glucose-Capillary: 196 mg/dL — ABNORMAL HIGH (ref 70–99)
Glucose-Capillary: 229 mg/dL — ABNORMAL HIGH (ref 70–99)

## 2024-04-15 LAB — BRAIN NATRIURETIC PEPTIDE: B Natriuretic Peptide: 141.4 pg/mL — ABNORMAL HIGH (ref 0.0–100.0)

## 2024-04-15 LAB — PHOSPHORUS: Phosphorus: 3.2 mg/dL (ref 2.5–4.6)

## 2024-04-15 MED ORDER — FUROSEMIDE 10 MG/ML IJ SOLN
40.0000 mg | Freq: Once | INTRAMUSCULAR | Status: AC
Start: 2024-04-15 — End: 2024-04-15
  Administered 2024-04-15: 40 mg via INTRAVENOUS
  Filled 2024-04-15: qty 4

## 2024-04-15 MED ORDER — DOXYCYCLINE HYCLATE 100 MG PO TABS
100.0000 mg | ORAL_TABLET | Freq: Two times a day (BID) | ORAL | Status: DC
  Administered 2024-04-15 – 2024-04-17 (×5): 100 mg via ORAL
  Filled 2024-04-15 (×5): qty 1

## 2024-04-15 MED ORDER — FUROSEMIDE 10 MG/ML IJ SOLN
40.0000 mg | Freq: Once | INTRAMUSCULAR | Status: AC
  Administered 2024-04-15: 40 mg via INTRAVENOUS
  Filled 2024-04-15: qty 4

## 2024-04-15 MED ORDER — AMOXICILLIN-POT CLAVULANATE 875-125 MG PO TABS
1.0000 | ORAL_TABLET | Freq: Two times a day (BID) | ORAL | Status: DC
  Administered 2024-04-15 – 2024-04-17 (×5): 1 via ORAL
  Filled 2024-04-15 (×5): qty 1

## 2024-04-15 MED ORDER — FUROSEMIDE 40 MG PO TABS: 40.0000 mg | ORAL_TABLET | Freq: Every day | ORAL | Status: DC

## 2024-04-15 NOTE — Progress Notes (Signed)
 PROGRESS NOTE    Ricky Johnston  EAV:[id number] DOB: 24-Oct-1951 DOA: 04/11/2024 PCP: [facility]    No chief complaint on file.   Brief Narrative:  Ricky GRETH Sr. is a 73 y.o. male with history of COPD, atrial fibrillation, chronic HFrEF, obesity, sleep apnea, chronic kidney disease stage III, anemia who was recently admitted to [facility] and discharged on 04/09/2024 after being treated for sepsis secondary to UTI and patient is also being treated for bladder cancer was found unresponsive at the rehab and was brought to the ER at [facility]  Patient is chronically on BiPAP, but it does appear he has not been using it in the SNF,  ED workup including ABG showed pH of 7.23 with PCO2 of 75.  Patient was was placed on BiPAP.  Chest x-ray showed some congestion and CT chest abdomen pelvis showed diffuse infiltrates concerning for pulmonary edema versus atypical infection.  Patient was given Lasix  20 mg IV and started on empiric antibiotics for pneumonia.  Repeat ABG showed pH of 7.36 pCO2 58 patient became more alert awake.  CT of the head shows nonspecific hypoattenuation in the left medial temporal lobe.  MRI brain was recommended, he was admitted for further workup.   Assessment & Plan:   Principal Problem:   Acute respiratory failure with hypoxia and hypercapnia (HCC) Active Problems:   COPD (chronic obstructive pulmonary disease) (HCC)   Chronic systolic heart failure (HCC)   HTN (hypertension)   CKD stage 3a, GFR 45-59 ml/min (HCC)   BPH (benign prostatic hyperplasia)   Hyperlipidemia associated with type 2 diabetes mellitus (HCC)   OSA (obstructive sleep apnea)   Anemia   Bladder cancer (HCC)   Acute and chronic respiratory failure with hypercapnia (HCC)   A-fib (HCC)   Unresponsive episode   Acute renal failure (ARF) (HCC)     Acute on chronic respiratory failure with hypoxia and hypercapnia  -likely a combination of COPD and  decompensation of OSA.  Apparently he has not been using his BiPAP at the facility . -He was kept on BiPAP overnight, with significant improvement of mentation. -He was encouraged to use incentive spirometry and flutter valve - This morning appears with more dyspnea, tachypnea, hypoxia increased work of breathing, continue with BiPAP support, started on diuresis.  Acute metabolic encephalopathy/brief episode of unresponsiveness -Likely due to CO2 retention, improved on BiPAP - Abnormal finding in CT head, follow an MRI brain, it was obtained during this hospital stay with no acute findings. - EEG with no evidence of elliptic form abnormalities, only significant for mild diffuse slowing indicative of global cerebral dysfunction  COPD -No active wheezing-Home Pulmicort  and as needed nebs  pneumonia  - CT [facility], with evidence of groundglass attenuation and scattered pattern, and scant bronchogram consolidation of the right lower lobe, which may be concerning for aspiration, very likely due to to his encephalopathic/unresponsive event - Will narrow antibiotic to Rocephin  and Flagyl   Acute on chronic chronic HFrEF  last EF measured was 50 to 55% with reduced RV function on June 18, 2023.  -On as needed Lasix , started on low-dose Lasix , but he does appear to be with volume overload this morning requiring increased oxygen support, so we will start on the Lasix  scheduled for now and then addition to p.o. once improved  - History of A-fib - on Eliquis .  History of bladder cancer - undergoing therapy.  [facility]  Chronic kidney disease stage III  -creatinine is slightly better from recent past when patient was discharged on 04/09/2024 but has been progressively worsening since last July 2024.  CT scan did show some abnormality in the right ureter.  Patient is undergoing therapy for bladder cancer will need follow-up with urologist. -Avoid nephrotoxic  medication, will monitor closely does appear he received IV contrast at [facility]  Abnormality in the rectosigmoid area on CAT scan - done at [facility] will need follow-up as outpatient with gastroenterologist.  But as discussed with wife patient had colonoscopy last year at [facility], where he was told all within normal limit and colonoscopy in 10 years.  Chronic anemia  -follow CBC.  Indication for transfusion  Diabetes mellitus type 2 - on concentrated insulin .  Last hemoglobin A1c was 7.8  nine days ago, with insulin  sliding scale, will decrease U-500 from 30 units 3 times daily to 15 units 3 times daily  BPH  -on tamsulosin .  Neuropathy  -on gabapentin .  Diarrhea - Much improved, C. difficile antigen is positive, but toxin is negative, no evidence of active C. difficile infection, abdomen is nontender, benign, no leukocytosis, no fever, no indication to treat   Deconditioning -Consult PT, OT   DVT prophylaxis: Eliquis  Eliquis  Code Status: Full Family Communication: Discussed with wife at bedside 5/18 Disposition:   Status is: Inpatient    Consultants:  none   Subjective:  Patient with increased hypoxia overnight, as well reports some dyspnea.  Objective: Vitals:   04/15/24 0425 04/15/24 0726 04/15/24 0752 04/15/24 1141  BP: (!) 112/59  (!) 129/59 (!) 143/65  Pulse: 77 75 (!) 58 (!) 57  Resp: (!) 21 (!) 25 19 (!) 21  Temp: (!) 97.5 F (36.4 C)  98.7 F (37.1 C) 98.2 F (36.8 C)  TempSrc: Axillary  Oral Oral  SpO2: 97% 97% 95% (!) 89%  Weight:      Height:        Intake/Output Summary (Last 24 hours) at 04/15/2024 1341 Last data filed at 04/15/2024 1000 Gross per 24 hour  Intake 1040 ml  Output 2500 ml  Net -1460 ml   Filed Weights   04/11/24 2136  Weight: 115.1 kg    Examination:  Awake Alert, Oriented X 3, frail, deconditioned Symmetrical Chest wall movement, Good air movement bilaterally, CTAB RRR,No Gallops,Rubs or new Murmurs, No  Parasternal Heave +ve B.Sounds, Abd Soft, No tenderness, No rebound - guarding or rigidity. No Cyanosis, Clubbing or edema, No new Rash or bruise       Data Reviewed: I have personally reviewed following labs and imaging studies  CBC: Recent Labs  Lab 04/11/24 2132 04/12/24 0550 04/13/24 0428 04/14/24 0617  WBC 5.7 4.9 4.2 3.9*  NEUTROABS 4.5  --   --   --   HGB 10.8* 10.3* 10.3* 10.7*  HCT 35.3* 34.0* 34.7* 36.6*  MCV 92.9 93.4 94.3 92.9  PLT 178 183 161 163    Basic Metabolic Panel: Recent Labs  Lab 04/09/24 0448 04/11/24 2132 04/12/24 0550 04/13/24 0428 04/14/24 0617  NA 142 138 140 138 138  K 5.0 4.8 4.7 4.9 4.7  CL 106 101 104 103 102  CO2 29 28 27 25 25   GLUCOSE 76 141* 142* 185* 180*  BUN 71* 65* 65* 58* 57*  CREATININE 3.00* 2.36* 2.23* 2.10* 2.19*  CALCIUM  8.8* 8.6* 8.2* 8.3* 8.6*    GFR: Estimated Creatinine Clearance: 37 mL/min (A) (by C-G formula based on SCr  of 2.19 mg/dL (H)).  Liver Function Tests: Recent Labs  Lab 04/11/24 2132  AST 19  ALT 14  ALKPHOS 62  BILITOT 0.8  PROT 6.6  ALBUMIN 2.3*    CBG: Recent Labs  Lab 04/14/24 1229 04/14/24 1711 04/14/24 2013 04/15/24 0754 04/15/24 1140  GLUCAP 191* 190* 190* 194* 215*     Recent Results (from the past 240 hours)  MRSA Next Gen by PCR, Nasal     Status: Abnormal   Collection Time: 04/11/24  9:21 PM   Specimen: Nasal Mucosa; Nasal Swab  Result Value Ref Range Status   MRSA by PCR Next Gen DETECTED (A) NOT DETECTED Final    Comment: RESULT CALLED TO, READ BACK BY AND VERIFIED WITH: E VANDYCK RN 04/12/2024 @ 0005 BY AB (NOTE) The GeneXpert MRSA Assay (FDA approved for NASAL specimens only), is one component of a comprehensive MRSA colonization surveillance program. It is not intended to diagnose MRSA infection nor to guide or monitor treatment for MRSA infections. Test performance is not FDA approved in patients less than 43 years old. Performed at [facility],  [address], [place], [place] [address]   C Difficile Quick Screen w PCR reflex     Status: Abnormal   Collection Time: 04/12/24  8:02 AM   Specimen: STOOL  Result Value Ref Range Status   C Diff antigen POSITIVE (A) NEGATIVE Final   C Diff toxin NEGATIVE NEGATIVE Final   C Diff interpretation Results are indeterminate. See PCR results.  Final    Comment: Performed at [facility], [address], [place], [place] [address]  C. Diff by PCR, Reflexed     Status: None   Collection Time: 04/12/24  8:02 AM  Result Value Ref Range Status   Toxigenic C. Difficile by PCR NEGATIVE NEGATIVE Final    Comment: Patient is colonized with non toxigenic C. difficile. May not need treatment unless significant symptoms are present. Performed at [facility], [address] [place], [place] [address]          Radiology Studies: DG Chest Port 1 View Result Date: 04/15/2024 CLINICAL DATA:  [id number] Hypoxia [id number] EXAM: PORTABLE CHEST - 1 VIEW COMPARISON:  Apr 11, 2024 FINDINGS: Central pulmonary vascular congestion. No focal airspace consolidation, pleural effusion, or pneumothorax. Unchanged cardiomegaly. Tortuous aorta. No acute fracture or destructive lesion. IMPRESSION: Unchanged cardiomegaly with central pulmonary vascular congestion. Electronically Signed   By: Rance Burrows M.D.   On: 04/15/2024 12:15        Scheduled Meds:  apixaban   5 mg Oral BID   atorvastatin   40 mg Oral QHS   budesonide  (PULMICORT ) nebulizer solution  0.25 mg Nebulization BID   Chlorhexidine  Gluconate Cloth  6 each Topical Daily   ferrous sulfate   325 mg Oral Once per day on Monday Wednesday Friday   furosemide   40 mg Intravenous Once   [START ON 04/16/2024] furosemide   40 mg Oral Daily   gabapentin   100 mg Oral BID   insulin  aspart  0-9 Units Subcutaneous TID WC   insulin  regular human CONCENTRATED  15 Units Subcutaneous TID WC   multivitamin with minerals  1 tablet Oral Daily   mupirocin  ointment  1  Application Nasal BID   Ensure Max Protein  11 oz Oral BID   rOPINIRole   1 mg Oral QHS   tamsulosin   0.4 mg Oral QHS   Continuous Infusions:  cefTRIAXone  (ROCEPHIN )  IV Stopped (04/15/24 0728)  metronidazole  Stopped (04/15/24 0939)     LOS: 4 days     Seena Dadds, MD [facility]   To contact the attending provider between 7A-7P or the covering provider during after hours 7P-7A, please log into the web site [email or web address] and access using universal [place] password for that web site. If you do not have the password, please call the hospital operator.  04/15/2024, 1:41 PM

## 2024-04-15 NOTE — Care Management Important Message (Signed)
 Important Message  Patient Details  Name: Ricky AGER Sr. MRN: [id number] Date of Birth: 19-Nov-1951   Important Message Given:  Yes - Medicare IM     Wynonia Hedges 04/15/2024, 3:51 PM

## 2024-04-15 NOTE — Progress Notes (Signed)
 Physical Therapy Treatment Patient Details Name: Ricky ROBEY Sr. MRN: [id number] DOB: October 08, 1951 Today's Date: 04/15/2024   History of Present Illness Pt is a 73 y/o M admitted on 04/11/24 after being found unresponsive at rehab.  Pt recently admitted to hospital & treated for sepsis 2/2 UTI & d/c on 04/09/24. Pt is currently being treated for acute respiratory failure with hypoxia & hypercapnia, possible PNA. PMH: COPD, a-fib, chronic HFrEF, obesity, sleep apnea, CKD 3, anemia, bladder CA, pulmonary fibrosis    PT Comments  Tolerated sitting EOB majority of session. Max assist to roll and rise to EOB. Mod assist for LE support back into bed. Able to bridge and scoot up in bed with assist, pulling through UEs through head rest. Reviwed LE exercises. SpO2 94% on 8L when PT entered room. 91% on 6L with activity. BP supine 143/65. Seated EOB with + dizziness 112/82 HR 74. Left with alarm on, modified upright chair position in bed. Patient will continue to benefit from skilled physical therapy services to further improve independence with functional mobility.    If plan is discharge home, recommend the following: Two people to help with walking and/or transfers;Two people to help with bathing/dressing/bathroom;Help with stairs or ramp for entrance;Assist for transportation;Direct supervision/assist for medications management;Direct supervision/assist for financial management   Can travel by private vehicle     No  Equipment Recommendations  Other (comment) (defer to next venue)    Recommendations for Other Services       Precautions / Restrictions Precautions Precautions: Fall Recall of Precautions/Restrictions: Intact Precaution/Restrictions Comments: watch BP Restrictions Weight Bearing Restrictions Per Provider Order: No     Mobility  Bed Mobility Overal bed mobility: Needs Assistance Bed Mobility: Rolling, Sidelying to Sit, Sit to Supine Rolling: Max assist, Used rails Sidelying  to sit: Max assist, HOB elevated, Used rails   Sit to supine: Mod assist, Used rails   General bed mobility comments: Max assist to roll left and right, guide UEs to rail to hold. max assist for LEs and trunk to rise to EOB. Max cues to facilitate. delayed processing.  Able to lean onto Rt side to lower back to bed, with mod assist for LEs.    Transfers Overall transfer level: Needs assistance Equipment used: Rolling walker (2 wheels) Transfers: Sit to/from Stand Sit to Stand: Total assist           General transfer comment: Attempted to stand without success. Unable to facilitate with multimodal cues.    Ambulation/Gait                   Stairs             Wheelchair Mobility     Tilt Bed    Modified Rankin (Stroke Patients Only)       Balance Overall balance assessment: Needs assistance Sitting-balance support: Feet supported, Bilateral upper extremity supported Sitting balance-Leahy Scale: Poor   Postural control: Posterior lean                                  Communication Communication Communication: No apparent difficulties  Cognition Arousal: Alert Behavior During Therapy: WFL for tasks assessed/performed   PT - Cognitive impairments: Orientation, Awareness, Initiation, Problem solving, Safety/Judgement, Attention                       PT - Cognition Comments: oriented to self, name of  hospital only. Following commands: Impaired Following commands impaired: Follows one step commands with increased time, Follows one step commands inconsistently    Cueing Cueing Techniques: Verbal cues, Tactile cues, Gestural cues  Exercises General Exercises - Lower Extremity Ankle Circles/Pumps: AROM, Both, 10 reps, Supine Quad Sets: Strengthening, Both, 10 reps, Supine Short Arc Quad: Strengthening, Both, 10 reps, Supine    General Comments General comments (skin integrity, edema, etc.): SpO2 94% on 8L when PT entered room.  91% on 6L with activity. BP supine 143/65. Seated EOB with + dizziness 112/82 HR 74.      Pertinent Vitals/Pain Pain Assessment Pain Assessment: Faces Faces Pain Scale: Hurts little more Pain Location: all over Pain Descriptors / Indicators: Aching Pain Intervention(s): Monitored during session, Repositioned    Home Living                          Prior Function            PT Goals (current goals can now be found in the care plan section) Acute Rehab PT Goals Patient Stated Goal: get better PT Goal Formulation: With patient/family Time For Goal Achievement: 04/26/24 Potential to Achieve Goals: Good Progress towards PT goals: Progressing toward goals    Frequency    Min 3X/week      PT Plan      Co-evaluation              AM-PAC PT "6 Clicks" Mobility   Outcome Measure  Help needed turning from your back to your side while in a flat bed without using bedrails?: A Lot Help needed moving from lying on your back to sitting on the side of a flat bed without using bedrails?: A Lot Help needed moving to and from a bed to a chair (including a wheelchair)?: Total Help needed standing up from a chair using your arms (e.g., wheelchair or bedside chair)?: Total Help needed to walk in hospital room?: Total Help needed climbing 3-5 steps with a railing? : Total 6 Click Score: 8    End of Session Equipment Utilized During Treatment: Oxygen Activity Tolerance:  (limited 2/2 lightheadedness sitting EOB) Patient left: in bed;with call bell/phone within reach;with bed alarm set (chair position) Nurse Communication: Mobility status PT Visit Diagnosis: Difficulty in walking, not elsewhere classified (R26.2);Muscle weakness (generalized) (M62.81);Other abnormalities of gait and mobility (R26.89);Dizziness and giddiness (R42)     Time: 1610-9604 PT Time Calculation (min) (ACUTE ONLY): 31 min  Charges:    $Therapeutic Activity: 23-37 mins PT General Charges $$  ACUTE PT VISIT: 1 Visit                     Jory Ng, PT, DPT [facility]  Rehabilitation Services Physical Therapist Office: [phone] Website: [email or web address]    Alinda Irani 04/15/2024, 1:41 PM

## 2024-04-15 NOTE — Progress Notes (Signed)
 Ok to narrow to doxy/augmentin  for 3 more days per Dr Osborne Blazer.  Ivery Marking, PharmD, BCIDP, AAHIVP, CPP Infectious Disease Pharmacist 04/15/2024 2:22 PM

## 2024-04-15 NOTE — Progress Notes (Signed)
 PT Cancellation Note  Patient Details Name: Ricky CASWELL Sr. MRN: [id number] DOB: 12/21/1950   Cancelled Treatment:    Reason Eval/Treat Not Completed: Other (comment)  Patient found with SpO2 82% on 4L. Moderately dyspneic. RN notified, increased to 8L, slowly improving to 85% until team arrived to assume care.  Will check back later for appropriateness of PT follow-up.  Jory Ng, PT, DPT [facility]  Rehabilitation Services Physical Therapist Office: [phone] Website: [email or web address]  Alinda Irani 04/15/2024, 10:55 AM

## 2024-04-15 NOTE — Plan of Care (Signed)
  Problem: Education: Goal: Knowledge of General Education information will improve Description: Including pain rating scale, medication(s)/side effects and non-pharmacologic comfort measures Outcome: Progressing   Problem: Health Behavior/Discharge Planning: Goal: Ability to manage health-related needs will improve Outcome: Progressing   Problem: Clinical Measurements: Goal: Ability to maintain clinical measurements within normal limits will improve Outcome: Progressing Goal: Will remain free from infection Outcome: Progressing Goal: Diagnostic test results will improve Outcome: Progressing Goal: Respiratory complications will improve Outcome: Progressing Goal: Cardiovascular complication will be avoided Outcome: Progressing   Problem: Activity: Goal: Risk for activity intolerance will decrease Outcome: Progressing   Problem: Nutrition: Goal: Adequate nutrition will be maintained Outcome: Progressing   Problem: Coping: Goal: Level of anxiety will decrease Outcome: Progressing   Problem: Elimination: Goal: Will not experience complications related to bowel motility Outcome: Progressing Goal: Will not experience complications related to urinary retention Outcome: Progressing   Problem: Pain Managment: Goal: General experience of comfort will improve and/or be controlled Outcome: Progressing   Problem: Safety: Goal: Ability to remain free from injury will improve Outcome: Progressing   Problem: Skin Integrity: Goal: Risk for impaired skin integrity will decrease Outcome: Progressing   Problem: Education: Goal: Ability to describe self-care measures that may prevent or decrease complications (Diabetes Survival Skills Education) will improve Outcome: Progressing Goal: Individualized Educational Video(s) Outcome: Progressing   Problem: Coping: Goal: Ability to adjust to condition or change in health will improve Outcome: Progressing   Problem: Fluid  Volume: Goal: Ability to maintain a balanced intake and output will improve Outcome: Progressing   Problem: Health Behavior/Discharge Planning: Goal: Ability to identify and utilize available resources and services will improve Outcome: Progressing Goal: Ability to manage health-related needs will improve Outcome: Progressing   Problem: Metabolic: Goal: Ability to maintain appropriate glucose levels will improve Outcome: Progressing   Problem: Nutritional: Goal: Maintenance of adequate nutrition will improve Outcome: Progressing Goal: Progress toward achieving an optimal weight will improve Outcome: Progressing   Problem: Skin Integrity: Goal: Risk for impaired skin integrity will decrease Outcome: Progressing   Problem: Tissue Perfusion: Goal: Adequacy of tissue perfusion will improve Outcome: Progressing   Problem: Education: Goal: Knowledge of disease or condition will improve Outcome: Progressing Goal: Knowledge of the prescribed therapeutic regimen will improve Outcome: Progressing Goal: Individualized Educational Video(s) Outcome: Progressing   Problem: Activity: Goal: Ability to tolerate increased activity will improve Outcome: Progressing Goal: Will verbalize the importance of balancing activity with adequate rest periods Outcome: Progressing   Problem: Respiratory: Goal: Ability to maintain a clear airway will improve Outcome: Progressing Goal: Levels of oxygenation will improve Outcome: Progressing Goal: Ability to maintain adequate ventilation will improve Outcome: Progressing

## 2024-04-15 NOTE — Progress Notes (Signed)
 Received a call from [facility] that pt' blood cultures resulted as gram positive cocci in clusters . Provider on call C. Hall notified . Nurse that called states that they will send over a copy of results via fax.

## 2024-04-15 NOTE — TOC Progression Note (Signed)
 Transition of Care (TOC) - Progression Note    Patient Details  Name: Ricky AZURE Sr. MRN: [id number] Date of Birth: November 04, 1951  Transition of Care [facility]) CM/SW Contact  Jannice Mends, LCSW Phone Number: 04/15/2024, 11:45 AM  Clinical Narrative:    CSW updated [facility].    Expected Discharge Plan: Skilled Nursing Facility Barriers to Discharge: Continued Medical Work up  Expected Discharge Plan and Services In-house Referral: Clinical Social Work   Post Acute Care Choice: Skilled Nursing Facility Living arrangements for the past 2 months: Single Family Home, Skilled Nursing Facility                                       Social Determinants of Health (SDOH) Interventions SDOH Screenings   Food Insecurity: No Food Insecurity (04/11/2024)  Housing: Low Risk  (04/11/2024)  Transportation Needs: No Transportation Needs (04/11/2024)  Utilities: Not At Risk (04/11/2024)  Depression (PHQ2-9): Low Risk  (11/08/2021)  Social Connections: Moderately Isolated (04/11/2024)  Tobacco Use: Medium Risk (04/12/2024)    Readmission Risk Interventions    04/03/2024    8:38 PM  Readmission Risk Prevention Plan  Transportation Screening Complete  Medication Review (RN Care Manager) Complete  PCP or Specialist appointment within 3-5 days of discharge Complete  HRI or Home Care Consult Complete  SW Recovery Care/Counseling Consult Complete  Palliative Care Screening Not Applicable  Skilled Nursing Facility Complete

## 2024-04-16 ENCOUNTER — Inpatient Hospital Stay (HOSPITAL_COMMUNITY)

## 2024-04-16 DIAGNOSIS — J9602 Acute respiratory failure with hypercapnia: Secondary | ICD-10-CM | POA: Diagnosis not present

## 2024-04-16 DIAGNOSIS — J9601 Acute respiratory failure with hypoxia: Secondary | ICD-10-CM | POA: Diagnosis not present

## 2024-04-16 LAB — BLOOD GAS, VENOUS
Acid-Base Excess: 7.6 mmol/L — ABNORMAL HIGH (ref 0.0–2.0)
Bicarbonate: 36.9 mmol/L — ABNORMAL HIGH (ref 20.0–28.0)
O2 Saturation: 97.5 %
Patient temperature: 36.7
pCO2, Ven: 74 mmHg (ref 44–60)
pH, Ven: 7.3 (ref 7.25–7.43)
pO2, Ven: 75 mmHg — ABNORMAL HIGH (ref 32–45)

## 2024-04-16 LAB — CBC
HCT: 36.7 % — ABNORMAL LOW (ref 39.0–52.0)
Hemoglobin: 10.7 g/dL — ABNORMAL LOW (ref 13.0–17.0)
MCH: 27.5 pg (ref 26.0–34.0)
MCHC: 29.2 g/dL — ABNORMAL LOW (ref 30.0–36.0)
MCV: 94.3 fL (ref 80.0–100.0)
Platelets: 156 10*3/uL (ref 150–400)
RBC: 3.89 MIL/uL — ABNORMAL LOW (ref 4.22–5.81)
RDW: 14.4 % (ref 11.5–15.5)
WBC: 3.7 10*3/uL — ABNORMAL LOW (ref 4.0–10.5)
nRBC: 0 % (ref 0.0–0.2)

## 2024-04-16 LAB — BASIC METABOLIC PANEL WITH GFR
Anion gap: 7 (ref 5–15)
BUN: 60 mg/dL — ABNORMAL HIGH (ref 8–23)
CO2: 30 mmol/L (ref 22–32)
Calcium: 9.2 mg/dL (ref 8.9–10.3)
Chloride: 101 mmol/L (ref 98–111)
Creatinine, Ser: 2.31 mg/dL — ABNORMAL HIGH (ref 0.61–1.24)
GFR, Estimated: 29 mL/min — ABNORMAL LOW (ref >60)
Glucose, Bld: 170 mg/dL — ABNORMAL HIGH (ref 70–99)
Potassium: 4.4 mmol/L (ref 3.5–5.1)
Sodium: 138 mmol/L (ref 135–145)

## 2024-04-16 LAB — GLUCOSE, CAPILLARY
Glucose-Capillary: 157 mg/dL — ABNORMAL HIGH (ref 70–99)
Glucose-Capillary: 213 mg/dL — ABNORMAL HIGH (ref 70–99)
Glucose-Capillary: 173 mg/dL — ABNORMAL HIGH (ref 70–99)
Glucose-Capillary: 131 mg/dL — ABNORMAL HIGH (ref 70–99)
Glucose-Capillary: 184 mg/dL — ABNORMAL HIGH (ref 70–99)

## 2024-04-16 LAB — BRAIN NATRIURETIC PEPTIDE: B Natriuretic Peptide: 123.4 pg/mL — ABNORMAL HIGH (ref 0.0–100.0)

## 2024-04-16 LAB — PHOSPHORUS: Phosphorus: 4.4 mg/dL (ref 2.5–4.6)

## 2024-04-16 LAB — MAGNESIUM: Magnesium: 2 mg/dL (ref 1.7–2.4)

## 2024-04-16 LAB — PROCALCITONIN: Procalcitonin: 1.67 ng/mL

## 2024-04-16 MED ORDER — FUROSEMIDE 40 MG PO TABS
40.0000 mg | ORAL_TABLET | Freq: Every day | ORAL | Status: DC
  Administered 2024-04-17: 40 mg via ORAL
  Filled 2024-04-16: qty 1

## 2024-04-16 MED ORDER — MECLIZINE HCL 25 MG PO TABS
25.0000 mg | ORAL_TABLET | Freq: Three times a day (TID) | ORAL | Status: DC | PRN
  Administered 2024-04-16: 25 mg via ORAL
  Filled 2024-04-16 (×2): qty 1

## 2024-04-16 MED ORDER — FUROSEMIDE 10 MG/ML IJ SOLN
40.0000 mg | Freq: Once | INTRAMUSCULAR | Status: AC
  Administered 2024-04-16: 40 mg via INTRAVENOUS
  Filled 2024-04-16: qty 4

## 2024-04-16 NOTE — Progress Notes (Addendum)
 Critical result per lab venous blood gas c02 74, provider notified. Pt lethargic but answers to name and follows commands. Per provider no new orders or interventions are needed at this time, will continue to monitor .   04/16/24 0509  Vitals  Pulse Rate (!) 53  ECG Heart Rate (!) 55  Resp 12  MEWS COLOR  MEWS Score Color Green  Oxygen Therapy  SpO2 98 %  O2 Device Bi-PAP  MEWS Score  MEWS Temp 0  MEWS Systolic 0  MEWS Pulse 0  MEWS RR 1  MEWS LOC 0  MEWS Score 1  Provider Notification  Provider Name/Title Howerter MD  Date Provider Notified 04/16/24  Time Provider Notified [phone]  Method of Notification Page  Notification Reason Critical Result (Co2 74 per lab)  Date Critical Result Received 04/16/24  Time Critical Result Received 0508

## 2024-04-16 NOTE — TOC Progression Note (Signed)
 Transition of Care (TOC) - Progression Note    Patient Details  Name: Ricky FURIA Sr. MRN: [id number] Date of Birth: 08-Jun-1951  Transition of Care [facility]) CM/SW Contact  Jannice Mends, LCSW Phone Number: 04/16/2024, 10:48 AM  Clinical Narrative:    CSW provided update to [facility] that per MD, patient's home machine is on Bipap settings but he had a cpap mask which was not helpful. Hospital will send patient with the correct bipap mask.    Expected Discharge Plan: Skilled Nursing Facility Barriers to Discharge: Continued Medical Work up  Expected Discharge Plan and Services In-house Referral: Clinical Social Work   Post Acute Care Choice: Skilled Nursing Facility Living arrangements for the past 2 months: Single Family Home, Skilled Nursing Facility                                       Social Determinants of Health (SDOH) Interventions SDOH Screenings   Food Insecurity: No Food Insecurity (04/11/2024)  Housing: Low Risk  (04/11/2024)  Transportation Needs: No Transportation Needs (04/11/2024)  Utilities: Not At Risk (04/11/2024)  Depression (PHQ2-9): Low Risk  (11/08/2021)  Social Connections: Moderately Isolated (04/11/2024)  Tobacco Use: Medium Risk (04/12/2024)    Readmission Risk Interventions    04/03/2024    8:38 PM  Readmission Risk Prevention Plan  Transportation Screening Complete  Medication Review (RN Care Manager) Complete  PCP or Specialist appointment within 3-5 days of discharge Complete  HRI or Home Care Consult Complete  SW Recovery Care/Counseling Consult Complete  Palliative Care Screening Not Applicable  Skilled Nursing Facility Complete

## 2024-04-16 NOTE — Plan of Care (Signed)
  Problem: Education: Goal: Knowledge of General Education information will improve Description: Including pain rating scale, medication(s)/side effects and non-pharmacologic comfort measures Outcome: Progressing   Problem: Health Behavior/Discharge Planning: Goal: Ability to manage health-related needs will improve Outcome: Progressing   Problem: Clinical Measurements: Goal: Ability to maintain clinical measurements within normal limits will improve Outcome: Progressing Goal: Will remain free from infection Outcome: Progressing Goal: Diagnostic test results will improve Outcome: Progressing Goal: Respiratory complications will improve Outcome: Progressing Goal: Cardiovascular complication will be avoided Outcome: Progressing   Problem: Activity: Goal: Risk for activity intolerance will decrease Outcome: Progressing   Problem: Nutrition: Goal: Adequate nutrition will be maintained Outcome: Progressing   Problem: Coping: Goal: Level of anxiety will decrease Outcome: Progressing   Problem: Elimination: Goal: Will not experience complications related to bowel motility Outcome: Progressing Goal: Will not experience complications related to urinary retention Outcome: Progressing   Problem: Pain Managment: Goal: General experience of comfort will improve and/or be controlled Outcome: Progressing   Problem: Safety: Goal: Ability to remain free from injury will improve Outcome: Progressing   Problem: Skin Integrity: Goal: Risk for impaired skin integrity will decrease Outcome: Progressing   Problem: Education: Goal: Ability to describe self-care measures that may prevent or decrease complications (Diabetes Survival Skills Education) will improve Outcome: Progressing Goal: Individualized Educational Video(s) Outcome: Progressing   Problem: Coping: Goal: Ability to adjust to condition or change in health will improve Outcome: Progressing   Problem: Fluid  Volume: Goal: Ability to maintain a balanced intake and output will improve Outcome: Progressing   Problem: Health Behavior/Discharge Planning: Goal: Ability to identify and utilize available resources and services will improve Outcome: Progressing Goal: Ability to manage health-related needs will improve Outcome: Progressing   Problem: Metabolic: Goal: Ability to maintain appropriate glucose levels will improve Outcome: Progressing   Problem: Nutritional: Goal: Maintenance of adequate nutrition will improve Outcome: Progressing Goal: Progress toward achieving an optimal weight will improve Outcome: Progressing   Problem: Skin Integrity: Goal: Risk for impaired skin integrity will decrease Outcome: Progressing   Problem: Tissue Perfusion: Goal: Adequacy of tissue perfusion will improve Outcome: Progressing   Problem: Education: Goal: Knowledge of disease or condition will improve Outcome: Progressing Goal: Knowledge of the prescribed therapeutic regimen will improve Outcome: Progressing Goal: Individualized Educational Video(s) Outcome: Progressing   Problem: Activity: Goal: Ability to tolerate increased activity will improve Outcome: Progressing Goal: Will verbalize the importance of balancing activity with adequate rest periods Outcome: Progressing   Problem: Respiratory: Goal: Ability to maintain a clear airway will improve Outcome: Progressing Goal: Levels of oxygenation will improve Outcome: Progressing Goal: Ability to maintain adequate ventilation will improve Outcome: Progressing

## 2024-04-16 NOTE — Progress Notes (Signed)
 Patient was continuing to desaturate on home BIPAP machine. RN discussed with doctor and he wanted patient placed on hospital BIPAP with full face mask. This RT placed patient on Servo I vent in NIV mode PCV/BIPAP with pressures of 16/8 R:15 and 55% FIO2. Patient tolerating well at this time. Radiology at bedside to obtain chest xray. RN made aware of BIPAP settings.

## 2024-04-16 NOTE — Progress Notes (Addendum)
 [facility] night cross cover note:   I was notified by the patient's RN that this patient's O2 sat decreased in the range of 86-87%, with good pleth, while on BiPAP at 4 L [place], which it appears he has been on since yesterday afternoon, prompting increase in supplemental O2 to 6 L via existing BiPAP. Per chart review, he has a history of poor compliance with his outpatient BiPAP and is currently using nasal pillow mask with BiPAP. At this time, RT will switch the nasal pillow mask to our standard hospital BiPAP mask, which should help attend to any relative hypoxic contribution from mouth breathing.   Per chart review, the patient was hospitalized with acute on chronic hypoxic hypercapnic respiratory failure with multifactorial contributions, including acute COPD exacerbation as well as contribution from obstructive sleep apnea in the setting of suboptimal compliance with home BiPAP, with suspicion for additional contributions from acute on chronic systolic heart failure, for which she has been undergoing diuresis, as well as concern for contribution from pneumonia with potential aspiration component, currently on Rocephin  and IV Flagyl .   The patient is without acute complaint at this time, including not complaining of any worsening shortness of breath with the above oxygen desaturation.  Additional vital signs at this time appear stable, including afebrile, with heart rates in the 60s to 70s; most recent blood pressure 118/51, RR in the low 20s, consistent with dayshift.   In addition to switching BiPAP masks from the nasal pillow to standard hospital BiPAP mask, will also check stat cxr. Will also check VBG, BNP, procalcitonin level. I have also added phosphorus and magnesium  levels to the morning labs.   Update: VBG this AM while on BiPAP demonstrates 7.3/74, suggestive of an element of chronicity a/w his hypercapnia given his pH that is within normal limits. Will not make any adjustments to current bipap  settings at this time.      Camelia Cavalier, DO Hospitalist

## 2024-04-16 NOTE — Progress Notes (Addendum)
 Pts sats 86-87% on home bipap. Earlier in shift sats dropped down to 86-87% with good pleth, 02 was increased from [facility] to [facility]. Sats were 90-92%. Sats now back down to 86-87% pt denies sob , respiratory aware. Provider notified , per provider switch pt from home bipap to hospital bipap , respiratory at bedside. See new orders.   04/16/24 0024  Vitals  Temp 98.1 F (36.7 C)  BP (!) 118/51  MAP (mmHg) 71  BP Location Right Arm  Patient Position (if appropriate) Lying  Pulse Rate 67  Pulse Rate Source Monitor  ECG Heart Rate 71  Resp (!) 23  MEWS COLOR  MEWS Score Color Green  Oxygen Therapy  SpO2 (!) 87 %  MEWS Score  MEWS Temp 0  MEWS Systolic 0  MEWS Pulse 0  MEWS RR 1  MEWS LOC 0  MEWS Score 1

## 2024-04-16 NOTE — Progress Notes (Signed)
 Pt c/o feeling dizzy and like the room is spinning, reports that he has felt like this for around an hour . Vitals stable denies chest pain or SOB. Answers orientation questions appropriately (A&Ox4), follows commands.  Provider notified , see new orders. Encouraged pt to use call bell if he needs anything or does not feel well or if symptoms worsen. Call bell within reach with bed alarm set.   04/16/24 2209  Vitals  BP (!) 110/47  MAP (mmHg) 66  BP Location Right Arm  BP Method Automatic  Patient Position (if appropriate) Lying  Pulse Rate (!) 54  Pulse Rate Source Monitor  ECG Heart Rate (!) 56  Resp (!) 23  MEWS COLOR  MEWS Score Color Green  Oxygen Therapy  SpO2 97 %  O2 Device Nasal Cannula  O2 Flow Rate (L/min) 4 L/min  MEWS Score  MEWS Temp 0  MEWS Systolic 0  MEWS Pulse 0  MEWS RR 1  MEWS LOC 0  MEWS Score 1

## 2024-04-16 NOTE — Progress Notes (Addendum)
 PROGRESS NOTE    Ricky Johnston  ZOX:[id number] DOB: [birth year] DOA: 04/11/2024 PCP: [facility]    No chief complaint on file.   Brief Narrative:  Ricky ODOR Sr. is a 73 y.o. male with history of COPD, atrial fibrillation, chronic HFrEF, obesity, sleep apnea, chronic kidney disease stage III, anemia who was recently admitted to [facility] and discharged on 04/09/2024 after being treated for sepsis secondary to UTI and patient is also being treated for bladder cancer was found unresponsive at the rehab and was brought to the ER at [facility].  Patient is chronically on BiPAP, but it does appear he has not been using it in the SNF,  ED workup including ABG showed pH of 7.23 with PCO2 of 75.  Patient was was placed on BiPAP.  Chest x-ray showed some congestion and CT chest abdomen pelvis showed diffuse infiltrates concerning for pulmonary edema versus atypical infection.  Patient was given Lasix  20 mg IV and started on empiric antibiotics for pneumonia.  Repeat ABG showed pH of 7.36 pCO2 58 patient became more alert awake.  CT of the head shows nonspecific hypoattenuation in the left medial temporal lobe.  MRI brain was recommended, he was admitted for further workup.   Assessment & Plan:   Principal Problem:   Acute respiratory failure with hypoxia and hypercapnia (HCC) Active Problems:   COPD (chronic obstructive pulmonary disease) (HCC)   Chronic systolic heart failure (HCC)   HTN (hypertension)   CKD stage 3a, GFR 45-59 ml/min (HCC)   BPH (benign prostatic hyperplasia)   Hyperlipidemia associated with type 2 diabetes mellitus (HCC)   OSA (obstructive sleep apnea)   Anemia   Bladder cancer (HCC)   Acute and chronic respiratory failure with hypercapnia (HCC)   A-fib (HCC)   Unresponsive episode   Acute renal failure (ARF) (HCC)     Acute on chronic respiratory failure with hypoxia and hypercapnia  -likely a combination of COPD and  decompensation of OSA.  Apparently he has not been using his BiPAP at the facility . -He was kept on BiPAP overnight on admission with significant improvement of mentation. -He was encouraged to use incentive spirometry and flutter valve - Patient appears to be more somnolent with increased work of breathing over last couple days, apparently he on BiPAP machine, so RT staff provided appropriate full face mask and again according to his BiPAP this evening to ensure it functioning appropriately, and can DC back to SNF tomorrow.   - At baseline who is on 4 L oxygen daytime, and bleed into the BiPAP overnight, he was saturating 100% on 6 L this evening, he was lowered to 4 L with no hypoxia.  Acute metabolic encephalopathy/brief episode of unresponsiveness -Likely due to CO2 retention, improved on BiPAP - Abnormal finding in CT head, follow an MRI brain, it was obtained during this hospital stay with no acute findings. - EEG with no evidence of elliptic form abnormalities, only significant for mild diffuse slowing indicative of global cerebral dysfunction  COPD -No active wheezing-Home Pulmicort  and as needed nebs  pneumonia  - CT chest done at [facility], with evidence of groundglass attenuation and scattered pattern, and scant bronchogram consolidation of the right lower lobe, which may be concerning for aspiration, very likely due to to his encephalopathic/unresponsive event - Will narrow antibiotic to Rocephin  and Flagyl  much improved, switch to oral regimen Augmentin  and doxycycline  to finish another couple days (MRSA screen positive) .  Acute on chronic chronic HFrEF  last EF measured was 50 to 55% with reduced RV function on June 18, 2023.  -On as needed Lasix , started on low-dose Lasix . - Continue with IV Lasix  for now, and will need to change to scheduled p.o. Lasix  on discharge.  .  - History of A-fib - on Eliquis .  History of bladder cancer - undergoing therapy.  He is following  with [facility].  Chronic kidney disease stage III  -creatinine is slightly better from recent past when patient was discharged on 04/09/2024 but has been progressively worsening since last July 2024.  CT scan did show some abnormality in the right ureter.  Patient is undergoing therapy for bladder cancer will need follow-up with urologist. -Avoid nephrotoxic medication, will monitor closely does appear he received IV contrast [facility]  Abnormality in the rectosigmoid area on CAT scan - done at [facility] will need follow-up as outpatient with gastroenterologist.  But as discussed with wife patient had colonoscopy last year at [facility], where he was told all within normal limit and colonoscopy in 10 years.  Chronic anemia  -follow CBC.  Indication for transfusion  Diabetes mellitus type 2 - on concentrated insulin .  Last hemoglobin A1c was 7.8  nine days ago, with insulin  sliding scale, will decrease U-500 from 30 units 3 times daily to 15 units 3 times daily  BPH  -on tamsulosin .  Neuropathy  -on gabapentin .  Diarrhea - Much improved, C. difficile antigen is positive, but toxin is negative, no evidence of active C. difficile infection, abdomen is nontender, benign, no leukocytosis, no fever, no indication to treat   Deconditioning -Consult PT, OT   DVT prophylaxis: Eliquis  Eliquis  Code Status: Full Family Communication: Discussed with wife at bedside 5/18, D/W wife by phone 5/20 Disposition:   Status is: Inpatient    Consultants:  none   Subjective:  Patient with increased hypoxia overnight, as well reports some dyspnea.  Objective: Vitals:   04/16/24 0400 04/16/24 0448 04/16/24 0509 04/16/24 0734  BP: (!) 107/47 109/62  (!) 111/55  Pulse: (!) 55 (!) 58 (!) 53 (!) 44  Resp: 19 17 12 20   Temp:  98.4 F (36.9 C)  97.7 F (36.5 C)  TempSrc:    Axillary  SpO2: 96% 97% 98% 94%  Weight:      Height:        Intake/Output Summary (Last 24 hours) at  04/16/2024 1113 Last data filed at 04/16/2024 0450 Gross per 24 hour  Intake 480 ml  Output 3300 ml  Net -2820 ml   Filed Weights   04/11/24 2136  Weight: 115.1 kg    Examination:  Awake Alert, Oriented X 3, frail, deconditioned Symmetrical Chest wall movement, Good air movement bilaterally, CTAB RRR,No Gallops,Rubs or new Murmurs, No Parasternal Heave +ve B.Sounds, Abd Soft, No tenderness, No rebound - guarding or rigidity. No Cyanosis, Clubbing, trace edema, No new Rash or bruise        Data Reviewed: I have personally reviewed following labs and imaging studies  CBC: Recent Labs  Lab 04/11/24 2132 04/12/24 0550 04/13/24 0428 04/14/24 0617 04/15/24 1413 04/16/24 0440  WBC 5.7 4.9 4.2 3.9* 3.5* 3.7*  NEUTROABS 4.5  --   --   --   --   --   HGB 10.8* 10.3* 10.3* 10.7* 10.6* 10.7*  HCT 35.3* 34.0* 34.7* 36.6* 36.8* 36.7*  MCV 92.9 93.4 94.3 92.9 94.1 94.3  PLT 178 183 161 163 169 156  Basic Metabolic Panel: Recent Labs  Lab 04/12/24 0550 04/13/24 0428 04/14/24 0617 04/15/24 1413 04/16/24 0440  NA 140 138 138 140 138  K 4.7 4.9 4.7 4.9 4.4  CL 104 103 102 101 101  CO2 27 25 25 28 30   GLUCOSE 142* 185* 180* 185* 170*  BUN 65* 58* 57* 56* 60*  CREATININE 2.23* 2.10* 2.19* 2.19* 2.31*  CALCIUM  8.2* 8.3* 8.6* 9.2 9.2  MG  --   --   --  2.1 2.0  PHOS  --   --   --  3.2 4.4    GFR: Estimated Creatinine Clearance: 35.1 mL/min (A) (by C-G formula based on SCr of 2.31 mg/dL (H)).  Liver Function Tests: Recent Labs  Lab 04/11/24 2132  AST 19  ALT 14  ALKPHOS 62  BILITOT 0.8  PROT 6.6  ALBUMIN 2.3*    CBG: Recent Labs  Lab 04/15/24 0754 04/15/24 1140 04/15/24 1540 04/15/24 2127 04/16/24 0733  GLUCAP 194* 215* 196* 229* 157*     Recent Results (from the past 240 hours)  MRSA Next Gen by PCR, Nasal     Status: Abnormal   Collection Time: 04/11/24  9:21 PM   Specimen: Nasal Mucosa; Nasal Swab  Result Value Ref Range Status   MRSA by PCR  Next Gen DETECTED (A) NOT DETECTED Final    Comment: RESULT CALLED TO, READ BACK BY AND VERIFIED WITH: E VANDYCK RN 04/12/2024 @ 0005 BY AB (NOTE) The GeneXpert MRSA Assay (FDA approved for NASAL specimens only), is one component of a comprehensive MRSA colonization surveillance program. It is not intended to diagnose MRSA infection nor to guide or monitor treatment for MRSA infections. Test performance is not FDA approved in patients less than 45 years old. Performed at [facility], [address], [place], [place] [address]   C Difficile Quick Screen w PCR reflex     Status: Abnormal   Collection Time: 04/12/24  8:02 AM   Specimen: STOOL  Result Value Ref Range Status   C Diff antigen POSITIVE (A) NEGATIVE Final   C Diff toxin NEGATIVE NEGATIVE Final   C Diff interpretation Results are indeterminate. See PCR results.  Final    Comment: Performed at [facility], [address] [place], [place] [address]  C. Diff by PCR, Reflexed     Status: None   Collection Time: 04/12/24  8:02 AM  Result Value Ref Range Status   Toxigenic C. Difficile by PCR NEGATIVE NEGATIVE Final    Comment: Patient is colonized with non toxigenic C. difficile. May not need treatment unless significant symptoms are present. Performed at [facility], [address], [place], [place] [address]          Radiology Studies: DG Chest Port 1 View Result Date: 04/16/2024 CLINICAL DATA:  Hypoxia EXAM: PORTABLE CHEST 1 VIEW COMPARISON:  04/15/2024 FINDINGS: Cardiomegaly. Mediastinal contours within normal limits. No confluent airspace opacities, effusions or edema. No acute bony abnormality. IMPRESSION: Cardiomegaly.  No active disease. Electronically Signed   By: Janeece Mechanic M.D.   On: 04/16/2024 01:05   DG Chest Port 1 View Result Date: 04/15/2024 CLINICAL DATA:  200808 Hypoxia [id number] EXAM: PORTABLE CHEST - 1 VIEW COMPARISON:  Apr 11, 2024 FINDINGS: Central pulmonary vascular congestion. No  focal airspace consolidation, pleural effusion, or pneumothorax. Unchanged cardiomegaly. Tortuous aorta. No acute fracture or destructive lesion. IMPRESSION: Unchanged cardiomegaly with central pulmonary vascular congestion. Electronically Signed   By: Zeb Heys  Molly Angers M.D.   On: 04/15/2024 12:15        Scheduled Meds:  amoxicillin -clavulanate  1 tablet Oral Q12H   apixaban   5 mg Oral BID   atorvastatin   40 mg Oral QHS   budesonide  (PULMICORT ) nebulizer solution  0.25 mg Nebulization BID   Chlorhexidine  Gluconate Cloth  6 each Topical Daily   doxycycline   100 mg Oral Q12H   ferrous sulfate   325 mg Oral Once per day on Monday Wednesday Friday   [START ON 04/17/2024] furosemide   40 mg Oral Daily   gabapentin   100 mg Oral BID   insulin  aspart  0-9 Units Subcutaneous TID WC   insulin  regular human CONCENTRATED  15 Units Subcutaneous TID WC   multivitamin with minerals  1 tablet Oral Daily   mupirocin  ointment  1 Application Nasal BID   Ensure Max Protein  11 oz Oral BID   rOPINIRole   1 mg Oral QHS   tamsulosin   0.4 mg Oral QHS   Continuous Infusions:     LOS: 5 days     Seena Dadds, MD [facility]   To contact the attending provider between 7A-7P or the covering provider during after hours 7P-7A, please log into the web site [email or web address] and access using universal [place] password for that web site. If you do not have the password, please call the hospital operator.  04/16/2024, 11:13 AM

## 2024-04-17 DIAGNOSIS — I5022 Chronic systolic (congestive) heart failure: Secondary | ICD-10-CM | POA: Diagnosis not present

## 2024-04-17 DIAGNOSIS — J9601 Acute respiratory failure with hypoxia: Secondary | ICD-10-CM | POA: Diagnosis not present

## 2024-04-17 DIAGNOSIS — I159 Secondary hypertension, unspecified: Secondary | ICD-10-CM | POA: Diagnosis not present

## 2024-04-17 DIAGNOSIS — J438 Other emphysema: Secondary | ICD-10-CM | POA: Diagnosis not present

## 2024-04-17 LAB — CBC
HCT: 36.2 % — ABNORMAL LOW (ref 39.0–52.0)
Hemoglobin: 10.6 g/dL — ABNORMAL LOW (ref 13.0–17.0)
MCH: 28 pg (ref 26.0–34.0)
MCHC: 29.3 g/dL — ABNORMAL LOW (ref 30.0–36.0)
MCV: 95.5 fL (ref 80.0–100.0)
Platelets: 150 10*3/uL (ref 150–400)
RBC: 3.79 MIL/uL — ABNORMAL LOW (ref 4.22–5.81)
RDW: 14.2 % (ref 11.5–15.5)
WBC: 3.8 10*3/uL — ABNORMAL LOW (ref 4.0–10.5)
nRBC: 0 % (ref 0.0–0.2)

## 2024-04-17 LAB — BASIC METABOLIC PANEL WITH GFR
Anion gap: 14 (ref 5–15)
BUN: 69 mg/dL — ABNORMAL HIGH (ref 8–23)
CO2: 27 mmol/L (ref 22–32)
Calcium: 9.8 mg/dL (ref 8.9–10.3)
Chloride: 96 mmol/L — ABNORMAL LOW (ref 98–111)
Creatinine, Ser: 2.23 mg/dL — ABNORMAL HIGH (ref 0.61–1.24)
GFR, Estimated: 30 mL/min — ABNORMAL LOW (ref >60)
Glucose, Bld: 164 mg/dL — ABNORMAL HIGH (ref 70–99)
Potassium: 4.5 mmol/L (ref 3.5–5.1)
Sodium: 137 mmol/L (ref 135–145)

## 2024-04-17 LAB — GLUCOSE, CAPILLARY
Glucose-Capillary: 125 mg/dL — ABNORMAL HIGH (ref 70–99)
Glucose-Capillary: 100 mg/dL — ABNORMAL HIGH (ref 70–99)
Glucose-Capillary: 159 mg/dL — ABNORMAL HIGH (ref 70–99)

## 2024-04-17 MED ORDER — ADULT MULTIVITAMIN W/MINERALS CH: 1.0000 | ORAL_TABLET | Freq: Every day | ORAL | Status: DC

## 2024-04-17 MED ORDER — ENSURE MAX PROTEIN PO LIQD: 11.0000 [oz_av] | Freq: Two times a day (BID) | ORAL | Status: DC

## 2024-04-17 MED ORDER — MECLIZINE HCL 25 MG PO TABS: 25.0000 mg | ORAL_TABLET | Freq: Three times a day (TID) | ORAL | Status: DC | PRN

## 2024-04-17 MED ORDER — INSULIN REGULAR HUMAN (CONC) 500 UNIT/ML ~~LOC~~ SOPN: 15.0000 [IU] | PEN_INJECTOR | SUBCUTANEOUS | Status: DC

## 2024-04-17 MED ORDER — TIOTROPIUM BROMIDE MONOHYDRATE 18 MCG IN CAPS: 18.0000 ug | ORAL_CAPSULE | Freq: Every day | RESPIRATORY_TRACT | 2 refills | Status: DC

## 2024-04-17 MED ORDER — ALBUTEROL SULFATE (2.5 MG/3ML) 0.083% IN NEBU: 2.5000 mg | INHALATION_SOLUTION | RESPIRATORY_TRACT | 12 refills | Status: DC | PRN

## 2024-04-17 MED ORDER — FUROSEMIDE 20 MG PO TABS: 40.0000 mg | ORAL_TABLET | Freq: Every day | ORAL | Status: DC

## 2024-04-17 NOTE — Progress Notes (Signed)
 TRH night cross cover note:   I was notified by the patient's RN that the patient, who is about to start his scheduled evening BiPAP, is experiencing some dizziness associated with the sensation that the room is spinning.  Otherwise, he is without acute complaint at this time, and there is no corresponding report of acute focal neurologic deficit at this time.  I subsequently added an order for prn meclizine for vertigo.     Camelia Cavalier, DO Hospitalist

## 2024-04-17 NOTE — TOC Transition Note (Addendum)
 Transition of Care [facility]) - Discharge Note   Patient Details  Name: Ricky FELLMAN Sr. MRN: [id number] Date of Birth: [birth year]  [facility] CM/SW Contact:  Jannice Mends, LCSW Phone Number: 04/17/2024, 10:21 AM   Clinical Narrative:    Patient will DC to: [facility] Anticipated DC date: 04/17/24 Family notified: Spouse Transport by: Lyna Sandhoff    Per MD patient ready for DC to [facility]. RN to call report prior to discharge (room 159 and the # for report is [phone] ). RN, patient, patient's family, and facility notified of DC. Discharge Summary and FL2 sent to facility. DC packet on chart. Ambulance transport requested for patient.   CSW will sign off for now as social work intervention is no longer needed. Please consult us  again if new needs arise.     Final next level of care: Skilled Nursing Facility Barriers to Discharge: Barriers Resolved   Patient Goals and CMS Choice Patient states their goals for this hospitalization and ongoing recovery are:: Return to SNF CMS Medicare.gov Compare Post Acute Care list provided to:: Patient Represenative (must comment) Choice offered to / list presented to : Spouse [place] ownership interest in [facility].provided to:: Spouse    Discharge Placement   Existing PASRR number confirmed : 04/17/24          Patient chooses bed at: [facility] Patient to be transferred to facility by: [facility] Name of family member notified: Wife Patient and family notified of of transfer: 04/17/24  Discharge Plan and Services Additional resources added to the After Visit Summary for   In-house Referral: Clinical Social Work   Post Acute Care Choice: Skilled Nursing Facility                               Social Drivers of Health (SDOH) Interventions SDOH Screenings   Food Insecurity: No Food Insecurity (04/11/2024)  Housing: Low Risk  (04/11/2024)  Transportation Needs: No Transportation  Needs (04/11/2024)  Utilities: Not At Risk (04/11/2024)  Depression (PHQ2-9): Low Risk  (11/08/2021)  Social Connections: Moderately Isolated (04/11/2024)  Tobacco Use: Medium Risk (04/12/2024)     Readmission Risk Interventions    04/03/2024    8:38 PM  Readmission Risk Prevention Plan  Transportation Screening Complete  Medication Review (RN Care Manager) Complete  PCP or Specialist appointment within 3-5 days of discharge Complete  HRI or Home Care Consult Complete  SW Recovery Care/Counseling Consult Complete  Palliative Care Screening Not Applicable  Skilled Nursing Facility Complete

## 2024-04-17 NOTE — Plan of Care (Signed)
  Problem: Education: Goal: Knowledge of General Education information will improve Description: Including pain rating scale, medication(s)/side effects and non-pharmacologic comfort measures Outcome: Progressing   Problem: Health Behavior/Discharge Planning: Goal: Ability to manage health-related needs will improve Outcome: Progressing   Problem: Clinical Measurements: Goal: Ability to maintain clinical measurements within normal limits will improve Outcome: Progressing Goal: Will remain free from infection Outcome: Progressing Goal: Diagnostic test results will improve Outcome: Progressing Goal: Respiratory complications will improve Outcome: Progressing Goal: Cardiovascular complication will be avoided Outcome: Progressing   Problem: Activity: Goal: Risk for activity intolerance will decrease Outcome: Progressing   Problem: Nutrition: Goal: Adequate nutrition will be maintained Outcome: Progressing   Problem: Coping: Goal: Level of anxiety will decrease Outcome: Progressing   Problem: Elimination: Goal: Will not experience complications related to bowel motility Outcome: Progressing Goal: Will not experience complications related to urinary retention Outcome: Progressing   Problem: Pain Managment: Goal: General experience of comfort will improve and/or be controlled Outcome: Progressing   Problem: Safety: Goal: Ability to remain free from injury will improve Outcome: Progressing   Problem: Skin Integrity: Goal: Risk for impaired skin integrity will decrease Outcome: Progressing   Problem: Education: Goal: Ability to describe self-care measures that may prevent or decrease complications (Diabetes Survival Skills Education) will improve Outcome: Progressing Goal: Individualized Educational Video(s) Outcome: Progressing   Problem: Coping: Goal: Ability to adjust to condition or change in health will improve Outcome: Progressing   Problem: Fluid  Volume: Goal: Ability to maintain a balanced intake and output will improve Outcome: Progressing   Problem: Health Behavior/Discharge Planning: Goal: Ability to identify and utilize available resources and services will improve Outcome: Progressing Goal: Ability to manage health-related needs will improve Outcome: Progressing   Problem: Metabolic: Goal: Ability to maintain appropriate glucose levels will improve Outcome: Progressing   Problem: Nutritional: Goal: Maintenance of adequate nutrition will improve Outcome: Progressing Goal: Progress toward achieving an optimal weight will improve Outcome: Progressing   Problem: Skin Integrity: Goal: Risk for impaired skin integrity will decrease Outcome: Progressing   Problem: Tissue Perfusion: Goal: Adequacy of tissue perfusion will improve Outcome: Progressing   Problem: Education: Goal: Knowledge of disease or condition will improve Outcome: Progressing Goal: Knowledge of the prescribed therapeutic regimen will improve Outcome: Progressing Goal: Individualized Educational Video(s) Outcome: Progressing   Problem: Activity: Goal: Ability to tolerate increased activity will improve Outcome: Progressing Goal: Will verbalize the importance of balancing activity with adequate rest periods Outcome: Progressing   Problem: Respiratory: Goal: Ability to maintain a clear airway will improve Outcome: Progressing Goal: Levels of oxygenation will improve Outcome: Progressing Goal: Ability to maintain adequate ventilation will improve Outcome: Progressing

## 2024-04-17 NOTE — Discharge Summary (Signed)
 PATIENT DETAILS Name: Ricky TIU Sr. Age: 73 y.o. Sex: male Date of Birth: May 09, 1951 MRN: [id number]. Admitting Physician: Angelene Kelly, MD WGN:[id number], [facility]  Admit Date: 04/11/2024 Discharge date: 04/17/2024  Recommendations for Outpatient Follow-up:  Follow up with PCP in 1-2 weeks Please obtain CMP/CBC in one week  Admitted From:  SNF  Disposition: Skilled nursing facility   Discharge Condition: good  CODE STATUS:   Code Status: Full Code   Diet recommendation:  Diet Order             Diet - low sodium heart healthy           Diet Carb Modified           Diet heart healthy/carb modified Room service appropriate? No; Fluid consistency: Thin; Fluid restriction: 1200 mL Fluid  Diet effective now                    Brief Summary: 73 year old with history of COPD on home O2 4 L, OSA, chronic HFrEF, CKD stage IIIa-recently admitted to [facility] for sepsis secondary UTI-and discharged to SNF-brought in for altered mental status-found to have hypercapnia-started on BiPAP and admitted to the hospitalist service.  Significant studies MRI brain: No acute CVA EEG: No seizures  Brief Hospital Course: Acute metabolic encephalopathy Secondary to hypercapnia Neuroimaging negative EEG-no seizures. Significant improvement after starting BiPAP-awake/alert this morning. Continue to encourage BiPAP use (per prior notes-not using BiPAP at SNF prior to this hospitalization)  Acute on chronic hypoxic and hypercapnic respiratory failure Multifactorial etiology-secondary to noncompliance with BiPAP at facility-along with HFrEF exacerbation and OSA/COPD all contributing He was placed back on BiPAP with significant improvement in mentation Continue to encourage BiPAP use nightly Stable on usual 4 L of oxygen during the daytime.  PNA Has been on antibiotics since hospitalization-initially on Rocephin /Doxy-subsequently transition to Augmentin -has completed  6-7 days of antibiotics-and not felt to require any further antibiotics on discharge.  COPD Stable-with no exacerbation. Continue bronchodilators  Acute on chronic HFrEF Volume status stable Treated with IV Lasix -neg 10.2 L so far Has been transition to oral diuretics Continue to monitor volume status/electrolytes closely at SNF.  PAF Stable Continue Eliquis   Abnormality in the rectosigmoid area on CAT scan done at [facility] will need follow-up as outpatient with gastroenterologist.  Prior MD discussed with wife-apparently patient had a colonoscopy at [facility] last year which was essentially normal.  Defer further to outpatient setting.   History of bladder cancer Continue outpatient follow-up with oncology[facility]  CKD stage IIIa Close to baseline Continue to monitor electrolytes closely  Normocytic anemia Hb close to baseline Suspected etiology CKD stage IIIa/malignancy Continue to follow CBC closely  DM-2 (A1c 7.8 on 5/7) CBG stable on Humulin R  15 units 3 times a day  Peripheral neuropathy Neurontin   BPH Flomax   Debility/deconditioning PT/OT eval-SNF planned  Nutrition Status: Nutrition Problem: Increased nutrient needs Etiology: chronic illness (COPD) Signs/Symptoms: estimated needs Interventions: MVI   Class 2 Obesity: Estimated body mass index is 38.58 kg/m as calculated from the following:   Height as of this encounter: 5\' 8"  (1.727 m).   Weight as of this encounter: 115.1 kg.   Discharge Diagnoses:  Principal Problem:   Acute respiratory failure with hypoxia and hypercapnia (HCC) Active Problems:   COPD (chronic obstructive pulmonary disease) (HCC)   Chronic systolic heart failure (HCC)   HTN (hypertension)   CKD stage 3a, GFR 45-59 ml/min (HCC)   BPH (benign prostatic  hyperplasia)   Hyperlipidemia associated with type 2 diabetes mellitus (HCC)   OSA (obstructive sleep apnea)   Anemia   Bladder cancer (HCC)   Acute and chronic  respiratory failure with hypercapnia (HCC)   A-fib (HCC)   Unresponsive episode   Acute renal failure (ARF) (HCC)   Discharge Instructions:  Activity:  As tolerated with Full fall precautions use walker/cane & assistance as needed  Discharge Instructions     Call MD for:  difficulty breathing, headache or visual disturbances   Complete by: As directed    Diet - low sodium heart healthy   Complete by: As directed    Diet Carb Modified   Complete by: As directed    Discharge instructions   Complete by: As directed    Follow with Primary MD  [facility] in 1-2 weeks  Please get a complete blood count and chemistry panel checked by your Primary MD at your next visit, and again as instructed by your Primary MD.  Get Medicines reviewed and adjusted: Please take all your medications with you for your next visit with your Primary MD  Laboratory/radiological data: Please request your Primary MD to go over all hospital tests and procedure/radiological results at the follow up, please ask your Primary MD to get all Hospital records sent to his/her office.  In some cases, they will be blood work, cultures and biopsy results pending at the time of your discharge. Please request that your primary care M.D. follows up on these results.  Also Note the following: If you experience worsening of your admission symptoms, develop shortness of breath, life threatening emergency, suicidal or homicidal thoughts you must seek medical attention immediately by calling 911 or calling your MD immediately  if symptoms less severe.  You must read complete instructions/literature along with all the possible adverse reactions/side effects for all the Medicines you take and that have been prescribed to you. Take any new Medicines after you have completely understood and accpet all the possible adverse reactions/side effects.   Do not drive when taking Pain medications or sleeping medications  (Benzodaizepines)  Do not take more than prescribed Pain, Sleep and Anxiety Medications. It is not advisable to combine anxiety,sleep and pain medications without talking with your primary care practitioner  Special Instructions: If you have smoked or chewed Tobacco  in the last 2 yrs please stop smoking, stop any regular Alcohol  and or any Recreational drug use.  Wear Seat belts while driving.  Please note: You were cared for by a hospitalist during your hospital stay. Once you are discharged, your primary care physician will handle any further medical issues. Please note that NO REFILLS for any discharge medications will be authorized once you are discharged, as it is imperative that you return to your primary care physician (or establish a relationship with a primary care physician if you do not have one) for your post hospital discharge needs so that they can reassess your need for medications and monitor your lab values.   Increase activity slowly   Complete by: As directed    No wound care   Complete by: As directed       Allergies as of 04/17/2024       Reactions   Empagliflozin  Other (See Comments)   Urinary tract infectious disease **Jardiance **   Poison Ivy Extract [poison Ivy Extract] Rash   Poison Oak Extract Rash        Medication List     STOP  taking these medications    albuterol  108 (90 Base) MCG/ACT inhaler Commonly known as: VENTOLIN  HFA Replaced by: albuterol  (2.5 MG/3ML) 0.083% nebulizer solution   fluocinonide 0.05 % external solution Commonly known as: LIDEX   losartan  25 MG tablet Commonly known as: COZAAR    Spiriva  Respimat 2.5 MCG/ACT Aers Generic drug: Tiotropium Bromide  Monohydrate Replaced by: tiotropium 18 MCG inhalation capsule       TAKE these medications    acetaminophen  325 MG tablet Commonly known as: TYLENOL  Take 325 mg by mouth in the morning and at bedtime.   albuterol  (2.5 MG/3ML) 0.083% nebulizer solution Commonly known  as: PROVENTIL  Take 3 mLs (2.5 mg total) by nebulization every 2 (two) hours as needed for wheezing or shortness of breath. Replaces: albuterol  108 (90 Base) MCG/ACT inhaler   apixaban  5 MG Tabs tablet Commonly known as: ELIQUIS  Take 1 tablet (5 mg total) by mouth 2 (two) times daily.   atorvastatin  80 MG tablet Commonly known as: LIPITOR  Take 40 mg by mouth at bedtime.   cetirizine 5 MG tablet Commonly known as: ZYRTEC Take 5 mg by mouth daily as needed for allergies.   cholecalciferol  1000 units tablet Commonly known as: VITAMIN D  Take 1,000 Units by mouth every morning.   Ensure Max Protein Liqd Take 330 mLs (11 oz total) by mouth 2 (two) times daily.   ferrous sulfate  325 (65 FE) MG tablet Take 325 mg by mouth 3 (three) times a week.   Fluticasone -Salmeterol 250-50 MCG/DOSE Aepb Commonly known as: ADVAIR Inhale 1 puff into the lungs daily.   furosemide  20 MG tablet Commonly known as: LASIX  Take 2 tablets (40 mg total) by mouth daily. What changed: how much to take   gabapentin  100 MG capsule Commonly known as: NEURONTIN  Take 100 mg by mouth 2 (two) times daily.   Glucagon Emergency 1 MG Kit Inject 1 mg into the skin as needed (low bs).   Hydrocortisone  2 % Crea Apply 1 Application topically 2 (two) times daily as needed (rash on face).   insulin  regular human CONCENTRATED 500 UNIT/ML KwikPen Commonly known as: HUMULIN R  Inject 15 Units into the skin See admin instructions. Inject 15 units subcutaneously with breakfast,15 units subcutaneously with lunch and 15 units subcutaneously with dinner. What changed:  how much to take additional instructions   ipratropium 0.02 % nebulizer solution Commonly known as: ATROVENT  Take 2.5 mLs (0.5 mg total) by nebulization 2 (two) times daily.   ketoconazole  2 % cream Commonly known as: NIZORAL  Apply 1 Application topically in the morning and at bedtime. What changed: Another medication with the same name was removed.  Continue taking this medication, and follow the directions you see here.   levalbuterol  0.63 MG/3ML nebulizer solution Commonly known as: XOPENEX  Take 3 mLs (0.63 mg total) by nebulization 2 (two) times daily.   meclizine 25 MG tablet Commonly known as: ANTIVERT Take 1 tablet (25 mg total) by mouth 3 (three) times daily as needed for dizziness.   Mounjaro 12.5 MG/0.5ML Pen Generic drug: tirzepatide Inject 12.5 mg into the skin once a week.   multivitamin with minerals Tabs tablet Take 1 tablet by mouth daily. Start taking on: Apr 18, 2024   OXYGEN Inhale 2-3 L/min into the lungs See admin instructions. 2 L/min continuous. May increase to 3 L/min as needed for shortness of breath.   rOPINIRole  1 MG tablet Commonly known as: REQUIP  Take 1 mg by mouth at bedtime.   sennosides-docusate sodium  8.6-50 MG tablet Commonly known as:  SENOKOT-S Take 1 tablet by mouth daily as needed for constipation.   tamsulosin  0.4 MG Caps capsule Commonly known as: FLOMAX  Take 0.8 mg by mouth at bedtime.   tiotropium 18 MCG inhalation capsule Commonly known as: Spiriva  HandiHaler Place 1 capsule (18 mcg total) into inhaler and inhale daily. Replaces: Spiriva  Respimat 2.5 MCG/ACT Aers        Follow-up Information     [facility], [facility]. Schedule an appointment as soon as possible for a visit in 1 week(s).   Specialty: General Practice Contact information: [address] [place] [address] [phone]                Allergies  Allergen Reactions   Empagliflozin  Other (See Comments)    Urinary tract infectious disease **Jardiance **   Poison Ivy Extract [Poison Ivy Extract] Rash   Poison Oak Extract Rash     Other Procedures/Studies: DG Chest Port 1 View Result Date: 04/16/2024 CLINICAL DATA:  Hypoxia EXAM: PORTABLE CHEST 1 VIEW COMPARISON:  04/15/2024 FINDINGS: Cardiomegaly. Mediastinal contours within normal limits. No confluent airspace opacities, effusions or edema.  No acute bony abnormality. IMPRESSION: Cardiomegaly.  No active disease. Electronically Signed   By: Janeece Mechanic M.D.   On: 04/16/2024 01:05   DG Chest Port 1 View Result Date: 04/15/2024 CLINICAL DATA:  200808 Hypoxia [id number] EXAM: PORTABLE CHEST - 1 VIEW COMPARISON:  Apr 11, 2024 FINDINGS: Central pulmonary vascular congestion. No focal airspace consolidation, pleural effusion, or pneumothorax. Unchanged cardiomegaly. Tortuous aorta. No acute fracture or destructive lesion. IMPRESSION: Unchanged cardiomegaly with central pulmonary vascular congestion. Electronically Signed   By: Rance Burrows M.D.   On: 04/15/2024 12:15   EEG adult Result Date: 04/12/2024 Eleni Griffin, MD     04/12/2024  5:57 PM Routine EEG Report Jamez Ambrocio. is a 73 y.o. male with a history of altered mental status who is undergoing an EEG to evaluate for seizures. Report: This EEG was acquired with electrodes placed according to the International 10-20 electrode system (including Fp1, Fp2, F3, F4, C3, C4, P3, P4, O1, O2, T3, T4, T5, T6, A1, A2, Fz, Cz, Pz). The following electrodes were missing or displaced: none. The occipital dominant rhythm was 7 Hz. This activity is reactive to stimulation. Drowsiness was manifested by background fragmentation; deeper stages of sleep were identified by K complexes and sleep spindles. There was no focal slowing. There were no interictal epileptiform discharges. There were no electrographic seizures identified. There was no abnormal response to photic stimulation or hyperventilation. Impression and clinical correlation: This EEG was obtained while awake and asleep and is abnormal due to mild diffuse slowing indicative of global cerebral dysfunction. Epileptiform abnormalities were not seen during this recording. Greg Leaks, MD [facility] [phone] If 7pm- 7am, please page neurology on call as listed in AMION.   ECHOCARDIOGRAM COMPLETE Result Date: 04/12/2024     ECHOCARDIOGRAM REPORT   Patient Name:   ARTIST BLOOM Sr. Date of Exam: 04/12/2024 Medical Rec #:  [id number]          Height:       68.0 in Accession #:    [id number]         Weight:       253.7 lb Date of Birth:  1951-03-05          BSA:          2.261 m Patient Age:    74 years  BP:           124/63 mmHg Patient Gender: M                  HR:           65 bpm. Exam Location:  Inpatient Procedure: 2D Echo, Color Doppler and Cardiac Doppler (Both Spectral and Color            Flow Doppler were utilized during procedure). Indications:    R55 Syncope  History:        Patient has prior history of Echocardiogram examinations, most                 recent 07/01/2023.  Sonographer:    Andrena Bang Referring Phys: Angelene Kelly IMPRESSIONS  1. Left ventricular ejection fraction, by estimation, is 50%. The left ventricle has mildly decreased function. Left ventricular endocardial border not optimally defined to evaluate regional wall motion. Left ventricular diastolic parameters are indeterminate.  2. Right ventricular systolic function is normal. The right ventricular size is normal. Tricuspid regurgitation signal is inadequate for assessing PA pressure.  3. The mitral valve was not well visualized. No evidence of mitral valve regurgitation. No evidence of mitral stenosis.  4. The aortic valve is tricuspid. There is mild calcification of the aortic valve. Aortic valve regurgitation is not visualized. No aortic stenosis is present.  5. The inferior vena cava is dilated in size with >50% respiratory variability, suggesting right atrial pressure of 8 mmHg.  6. Technically difficult study with very poor images. FINDINGS  Left Ventricle: Left ventricular ejection fraction, by estimation, is 50%. The left ventricle has mildly decreased function. Left ventricular endocardial border not optimally defined to evaluate regional wall motion. Definity  contrast agent was given IV  to delineate the left ventricular endocardial  borders. The left ventricular internal cavity size was normal in size. There is no left ventricular hypertrophy. Left ventricular diastolic parameters are indeterminate. Right Ventricle: The right ventricular size is normal. Right vetricular wall thickness was not well visualized. Right ventricular systolic function is normal. Tricuspid regurgitation signal is inadequate for assessing PA pressure. Left Atrium: Left atrial size was normal in size. Right Atrium: Right atrial size was normal in size. Pericardium: There is no evidence of pericardial effusion. Mitral Valve: The mitral valve was not well visualized. No evidence of mitral valve regurgitation. No evidence of mitral valve stenosis. Tricuspid Valve: The tricuspid valve is not well visualized. Tricuspid valve regurgitation is not demonstrated. Aortic Valve: The aortic valve is tricuspid. There is mild calcification of the aortic valve. Aortic valve regurgitation is not visualized. No aortic stenosis is present. Aortic valve mean gradient measures 5.0 mmHg. Aortic valve peak gradient measures 10.1 mmHg. Aortic valve area, by VTI measures 3.32 cm. Pulmonic Valve: The pulmonic valve was normal in structure. Pulmonic valve regurgitation is not visualized. Aorta: The aortic root is normal in size and structure. Venous: The inferior vena cava is dilated in size with greater than 50% respiratory variability, suggesting right atrial pressure of 8 mmHg. IAS/Shunts: The interatrial septum was not well visualized.  LEFT VENTRICLE PLAX 2D LVIDd:         6.10 cm      Diastology LVIDs:         4.00 cm      LV e' medial:    9.14 cm/s LV PW:         1.10 cm      LV E/e' medial:  12.1 LV  IVS:        1.00 cm      LV e' lateral:   11.60 cm/s LVOT diam:     2.40 cm      LV E/e' lateral: 9.6 LV SV:         125 LV SV Index:   55 LVOT Area:     4.52 cm  LV Volumes (MOD) LV vol d, MOD A2C: 115.0 ml LV vol d, MOD A4C: 117.0 ml LV vol s, MOD A2C: 39.2 ml LV vol s, MOD A4C: 46.3 ml LV  SV MOD A2C:     75.8 ml LV SV MOD A4C:     117.0 ml LV SV MOD BP:      76.6 ml RIGHT VENTRICLE RV S prime:     13.80 cm/s TAPSE (M-mode): 1.9 cm LEFT ATRIUM             Index LA Vol (A2C):   32.5 ml 14.37 ml/m LA Vol (A4C):   36.4 ml 16.10 ml/m LA Biplane Vol: 37.1 ml 16.41 ml/m  AORTIC VALVE AV Area (Vmax):    3.53 cm AV Area (Vmean):   2.94 cm AV Area (VTI):     3.32 cm AV Vmax:           159.00 cm/s AV Vmean:          102.000 cm/s AV VTI:            0.378 m AV Peak Grad:      10.1 mmHg AV Mean Grad:      5.0 mmHg LVOT Vmax:         124.00 cm/s LVOT Vmean:        66.200 cm/s LVOT VTI:          0.277 m LVOT/AV VTI ratio: 0.73  AORTA Ao Asc diam: 3.60 cm MITRAL VALVE MV Area (PHT): 2.51 cm     SHUNTS MV Decel Time: 302 msec     Systemic VTI:  0.28 m MV E velocity: 111.00 cm/s  Systemic Diam: 2.40 cm MV A velocity: 72.70 cm/s MV E/A ratio:  1.53 Dalton McleanMD Electronically signed by Archer Bear Signature Date/Time: 04/12/2024/1:44:55 PM    Final    MR BRAIN WO CONTRAST Result Date: 04/12/2024 CLINICAL DATA:  Provided history: Neuro deficit, acute, stroke suspected. Additional history provided: Episode of unresponsiveness. EXAM: MRI HEAD WITHOUT CONTRAST TECHNIQUE: Multiplanar, multiecho pulse sequences of the brain and surrounding structures were obtained without intravenous contrast. COMPARISON:  Head CT 06/19/2023. FINDINGS: Brain: Generalized cerebral atrophy. Commensurate prominence of the ventricles and sulci. Multifocal T2 FLAIR hyperintense signal abnormality within the cerebral white matter and pons, nonspecific but compatible with moderate chronic small vessel ischemic disease. There are a few nonspecific punctate chronic microhemorrhages scattered within the supratentorial and infratentorial brain. No cortical encephalomalacia is identified. There is no acute infarct. No evidence of an intracranial mass. No chronic intracranial blood products. No extra-axial fluid collection. No midline  shift. Vascular: Maintained flow voids within the proximal large arterial vessels. Skull and upper cervical spine: No focal worrisome lesion. Sinuses/Orbits: No mass or acute finding within the imaged orbits. Prior bilateral ocular lens replacement. Mild mucosal thickening within the right ethmoid and right sphenoid sinuses. IMPRESSION: 1.  No evidence of an acute intracranial abnormality. 2. Moderate chronic small vessel ischemic changes within the cerebral white matter and pons 3. Generalized cerebral atrophy. 4. Mild paranasal sinus mucosal thickening. Electronically Signed   By: Bascom Lily D.O.  On: 04/12/2024 13:33   DG Chest Port 1 View Result Date: 04/11/2024 CLINICAL DATA:  Short of breath EXAM: PORTABLE CHEST 1 VIEW COMPARISON:  04/05/2024 FINDINGS: Single frontal view of the chest demonstrates stable enlargement of the cardiac silhouette. Continued pulmonary vascular congestion, with mild background interstitial prominence and patchy basilar consolidation concerning for developing edema. Trace right effusion. No pneumothorax. IMPRESSION: 1. Mild congestive heart failure, with no significant change in volume status since prior study. Electronically Signed   By: Bobbye Burrow M.D.   On: 04/11/2024 22:00   DG CHEST PORT 1 VIEW Result Date: 04/05/2024 CLINICAL DATA:  Shortness of breath. EXAM: PORTABLE CHEST 1 VIEW COMPARISON:  04/03/2024. FINDINGS: Patient is rotated to the right. Low lung volumes. Stable cardiomediastinal silhouette. Pulmonary vascular congestion with similar bilateral interstitial prominence, which could reflect interstitial edema. No sizable pleural effusion. No focal consolidation. No pneumothorax. No acute osseous abnormality. IMPRESSION: Cardiomegaly with pulmonary vascular congestion and probable interstitial edema, not significantly changed since the prior exam. Electronically Signed   By: Mannie Seek M.D.   On: 04/05/2024 10:45   CT RENAL STONE STUDY Result Date:  04/03/2024 CLINICAL DATA:  Sepsis pain kidney stones EXAM: CT ABDOMEN AND PELVIS WITHOUT CONTRAST TECHNIQUE: Multidetector CT imaging of the abdomen and pelvis was performed following the standard protocol without IV contrast. RADIATION DOSE REDUCTION: This exam was performed according to the departmental dose-optimization program which includes automated exposure control, adjustment of the mA and/or kV according to patient size and/or use of iterative reconstruction technique. COMPARISON:  None Available. FINDINGS: Lower chest: Right lower lobe atelectasis right pleural reaction, no significant pleural effusions Hepatobiliary: Liver normal. No parenchymal lesion no biliary dilatation. 5 mm gallstone neck of the gallbladder without gallbladder inflammatory changes Pancreas: Unremarkable. No pancreatic ductal dilatation or surrounding inflammatory changes. Spleen: Normal in size without focal abnormality. Adrenals/Urinary Tract: Adrenal glands are normal Right kidney normal. Left kidney is absent. Correlate clinically. Bladder demonstrates diffuse bladder wall thickening with a bubble of air in the bladder that could correlate with recent instrumentation. Findings could correlate with chronic bladder outlet obstruction. Prostate gland normal size. Surgical clips in the left renal fossa correlate with prior nephrectomy. Stomach/Bowel: Stomach is within normal limits. Appendix appears normal. No evidence of bowel wall thickening, distention, or inflammatory changes. No appendicitis, no diverticulitis Fecal impaction rectum without obstruction. Vascular/Lymphatic: Aortic atherosclerosis. No enlarged abdominal or pelvic lymph nodes. Reproductive: Prostate is unremarkable. Other: No abdominal wall hernia or abnormality. No abdominopelvic ascites. Musculoskeletal: Multilevel degenerative disc disease lumbosacral spine. No fractures. IMPRESSION: *Right lower lobe atelectasis and right pleural reaction. *Cholelithiasis  without gallbladder inflammatory changes. *Left kidney is absent. *Diffuse bladder wall thickening with a bubble of air in the bladder that could correlate with recent instrumentation. Findings could correlate with chronic bladder outlet obstruction. *Fecal impaction rectum without obstruction. *Aortic atherosclerosis. Electronically Signed   By: Fredrich Jefferson M.D.   On: 04/03/2024 17:19   DG Chest Port 1 View Result Date: 04/03/2024 CLINICAL DATA:  Weakness. EXAM: PORTABLE CHEST 1 VIEW COMPARISON:  06/19/2023 FINDINGS: Lung volumes are low. Stable cardiomegaly. Unchanged mediastinal contours. Vascular congestion. Interstitial accentuation may represent pulmonary edema. No significant pleural effusion. No focal airspace disease. No pneumothorax. IMPRESSION: Cardiomegaly with vascular congestion and possible pulmonary edema. Electronically Signed   By: Chadwick Colonel M.D.   On: 04/03/2024 12:01     TODAY-DAY OF DISCHARGE:  Subjective:   [place] today has no headache,no chest abdominal pain,no new weakness tingling or  numbness, feels much better wants to go home today.   Objective:   Blood pressure (!) 115/48, pulse (!) 58, temperature 98.1 F (36.7 C), temperature source Oral, resp. rate (!) 21, height 5\' 8"  (1.727 m), weight 115.1 kg, SpO2 96%.  Intake/Output Summary (Last 24 hours) at 04/17/2024 0837 Last data filed at 04/17/2024 0516 Gross per 24 hour  Intake --  Output 2300 ml  Net -2300 ml   Filed Weights   04/11/24 2136  Weight: 115.1 kg    Exam: Awake Alert, Oriented *3, No new F.N deficits, Normal affect Cle[place].AT,PERRAL Supple Neck,No JVD, No cervical lymphadenopathy appriciated.  Symmetrical Chest wall movement, Good air movement bilaterally, CTAB RRR,No Gallops,Rubs or new Murmurs, No Parasternal Heave +ve B.Sounds, Abd Soft, Non tender, No organomegaly appriciated, No rebound -guarding or rigidity. No Cyanosis, Clubbing or edema, No new Rash or bruise   PERTINENT  RADIOLOGIC STUDIES: DG Chest Port 1 View Result Date: 04/16/2024 CLINICAL DATA:  Hypoxia EXAM: PORTABLE CHEST 1 VIEW COMPARISON:  04/15/2024 FINDINGS: Cardiomegaly. Mediastinal contours within normal limits. No confluent airspace opacities, effusions or edema. No acute bony abnormality. IMPRESSION: Cardiomegaly.  No active disease. Electronically Signed   By: Janeece Mechanic M.D.   On: 04/16/2024 01:05   DG Chest Port 1 View Result Date: 04/15/2024 CLINICAL DATA:  200808 Hypoxia [id number] EXAM: PORTABLE CHEST - 1 VIEW COMPARISON:  Apr 11, 2024 FINDINGS: Central pulmonary vascular congestion. No focal airspace consolidation, pleural effusion, or pneumothorax. Unchanged cardiomegaly. Tortuous aorta. No acute fracture or destructive lesion. IMPRESSION: Unchanged cardiomegaly with central pulmonary vascular congestion. Electronically Signed   By: Rance Burrows M.D.   On: 04/15/2024 12:15     PERTINENT LAB RESULTS: CBC: Recent Labs    04/16/24 0440 04/17/24 0427  WBC 3.7* 3.8*  HGB 10.7* 10.6*  HCT 36.7* 36.2*  PLT 156 150   CMET CMP     Component Value Date/Time   NA 137 04/17/2024 0427   K 4.5 04/17/2024 0427   CL 96 (L) 04/17/2024 0427   CO2 27 04/17/2024 0427   GLUCOSE 164 (H) 04/17/2024 0427   BUN 69 (H) 04/17/2024 0427   CREATININE 2.23 (H) 04/17/2024 0427   CALCIUM  9.8 04/17/2024 0427   PROT 6.6 04/11/2024 2132   ALBUMIN 2.3 (L) 04/11/2024 2132   AST 19 04/11/2024 2132   ALT 14 04/11/2024 2132   ALKPHOS 62 04/11/2024 2132   BILITOT 0.8 04/11/2024 2132   GFRNONAA 30 (L) 04/17/2024 0427    GFR Estimated Creatinine Clearance: 36.3 mL/min (A) (by C-G formula based on SCr of 2.23 mg/dL (H)). No results for input(s): "LIPASE", "AMYLASE" in the last 72 hours. No results for input(s): "CKTOTAL", "CKMB", "CKMBINDEX", "TROPONINI" in the last 72 hours. Invalid input(s): "POCBNP" No results for input(s): "DDIMER" in the last 72 hours. No results for input(s): "HGBA1C" in the last 72  hours. No results for input(s): "CHOL", "HDL", "LDLCALC", "TRIG", "CHOLHDL", "LDLDIRECT" in the last 72 hours. No results for input(s): "TSH", "T4TOTAL", "T3FREE", "THYROIDAB" in the last 72 hours.  Invalid input(s): "FREET3" No results for input(s): "VITAMINB12", "FOLATE", "FERRITIN", "TIBC", "IRON", "RETICCTPCT" in the last 72 hours. Coags: No results for input(s): "INR" in the last 72 hours.  Invalid input(s): "PT" Microbiology: Recent Results (from the past 240 hours)  MRSA Next Gen by PCR, Nasal     Status: Abnormal   Collection Time: 04/11/24  9:21 PM   Specimen: Nasal Mucosa; Nasal Swab  Result Value Ref Range Status   MRSA  by PCR Next Gen DETECTED (A) NOT DETECTED Final    Comment: RESULT CALLED TO, READ BACK BY AND VERIFIED WITH: E VANDYCK RN 04/12/2024 @ 0005 BY AB (NOTE) The GeneXpert MRSA Assay (FDA approved for NASAL specimens only), is one component of a comprehensive MRSA colonization surveillance program. It is not intended to diagnose MRSA infection nor to guide or monitor treatment for MRSA infections. Test performance is not FDA approved in patients less than 18 years old. Performed at [facility], [address], [place], [place] [address]   C Difficile Quick Screen w PCR reflex     Status: Abnormal   Collection Time: 04/12/24  8:02 AM   Specimen: STOOL  Result Value Ref Range Status   C Diff antigen POSITIVE (A) NEGATIVE Final   C Diff toxin NEGATIVE NEGATIVE Final   C Diff interpretation Results are indeterminate. See PCR results.  Final    Comment: Performed at [facility], [address], [place], [place] [address]  C. Diff by PCR, Reflexed     Status: None   Collection Time: 04/12/24  8:02 AM  Result Value Ref Range Status   Toxigenic C. Difficile by PCR NEGATIVE NEGATIVE Final    Comment: Patient is colonized with non toxigenic C. difficile. May not need treatment unless significant symptoms are present. Performed at [facility]  [facility], [address], [place], [place] [address]     FURTHER DISCHARGE INSTRUCTIONS:  Get Medicines reviewed and adjusted: Please take all your medications with you for your next visit with your Primary MD  Laboratory/radiological data: Please request your Primary MD to go over all hospital tests and procedure/radiological results at the follow up, please ask your Primary MD to get all Hospital records sent to his/her office.  In some cases, they will be blood work, cultures and biopsy results pending at the time of your discharge. Please request that your primary care M.D. goes through all the records of your hospital data and follows up on these results.  Also Note the following: If you experience worsening of your admission symptoms, develop shortness of breath, life threatening emergency, suicidal or homicidal thoughts you must seek medical attention immediately by calling 911 or calling your MD immediately  if symptoms less severe.  You must read complete instructions/literature along with all the possible adverse reactions/side effects for all the Medicines you take and that have been prescribed to you. Take any new Medicines after you have completely understood and accpet all the possible adverse reactions/side effects.   Do not drive when taking Pain medications or sleeping medications (Benzodaizepines)  Do not take more than prescribed Pain, Sleep and Anxiety Medications. It is not advisable to combine anxiety,sleep and pain medications without talking with your primary care practitioner  Special Instructions: If you have smoked or chewed Tobacco  in the last 2 yrs please stop smoking, stop any regular Alcohol  and or any Recreational drug use.  Wear Seat belts while driving.  Please note: You were cared for by a hospitalist during your hospital stay. Once you are discharged, your primary care physician will handle any further medical issues. Please note that NO REFILLS for any  discharge medications will be authorized once you are discharged, as it is imperative that you return to your primary care physician (or establish a relationship with a primary care physician if you do not have one) for your post hospital discharge needs so that they can reassess your need  for medications and monitor your lab values.  Total Time spent coordinating discharge including counseling, education and face to face time equals greater than 30 minutes.  SignedKimberly Penna 04/17/2024 8:37 AM

## 2024-04-17 NOTE — TOC Progression Note (Signed)
 Transition of Care (TOC) - Progression Note    Patient Details  Name: Ricky GANTT Sr. MRN: [id number] Date of Birth: 10-Feb-1951  Transition of Care [facility]) CM/SW Contact  Jannice Mends, LCSW Phone Number: 04/17/2024, 9:31 AM  Clinical Narrative:    Awaiting confirmation from [facility] on timing to send patient. CSW updated patient's spouse who requested PTAR for transport and will meet [facility] at [facility]. CSW requested RN make sure to send patient's home Bipap machine and the new mask hospital provided with patient.    Expected Discharge Plan: Skilled Nursing Facility Barriers to Discharge: Barriers Resolved  Expected Discharge Plan and Services In-house Referral: Clinical Social Work   Post Acute Care Choice: Skilled Nursing Facility Living arrangements for the past 2 months: Single Family Home, Skilled Nursing Facility Expected Discharge Date: 04/17/24                                     Social Determinants of Health (SDOH) Interventions SDOH Screenings   Food Insecurity: No Food Insecurity (04/11/2024)  Housing: Low Risk  (04/11/2024)  Transportation Needs: No Transportation Needs (04/11/2024)  Utilities: Not At Risk (04/11/2024)  Depression (PHQ2-9): Low Risk  (11/08/2021)  Social Connections: Moderately Isolated (04/11/2024)  Tobacco Use: Medium Risk (04/12/2024)    Readmission Risk Interventions    04/03/2024    8:38 PM  Readmission Risk Prevention Plan  Transportation Screening Complete  Medication Review (RN Care Manager) Complete  PCP or Specialist appointment within 3-5 days of discharge Complete  HRI or Home Care Consult Complete  SW Recovery Care/Counseling Consult Complete  Palliative Care Screening Not Applicable  Skilled Nursing Facility Complete

## 2024-04-18 ENCOUNTER — Encounter: Payer: Self-pay | Admitting: Adult Health

## 2024-04-18 ENCOUNTER — Non-Acute Institutional Stay (SKILLED_NURSING_FACILITY): Payer: Self-pay | Admitting: Adult Health

## 2024-04-18 DIAGNOSIS — N401 Enlarged prostate with lower urinary tract symptoms: Secondary | ICD-10-CM

## 2024-04-18 DIAGNOSIS — J9621 Acute and chronic respiratory failure with hypoxia: Secondary | ICD-10-CM | POA: Diagnosis not present

## 2024-04-18 DIAGNOSIS — I5022 Chronic systolic (congestive) heart failure: Secondary | ICD-10-CM

## 2024-04-18 DIAGNOSIS — J438 Other emphysema: Secondary | ICD-10-CM

## 2024-04-18 DIAGNOSIS — N1831 Chronic kidney disease, stage 3a: Secondary | ICD-10-CM

## 2024-04-18 DIAGNOSIS — I129 Hypertensive chronic kidney disease with stage 1 through stage 4 chronic kidney disease, or unspecified chronic kidney disease: Secondary | ICD-10-CM

## 2024-04-18 DIAGNOSIS — E1169 Type 2 diabetes mellitus with other specified complication: Secondary | ICD-10-CM

## 2024-04-18 DIAGNOSIS — D631 Anemia in chronic kidney disease: Secondary | ICD-10-CM | POA: Insufficient documentation

## 2024-04-18 DIAGNOSIS — I4821 Permanent atrial fibrillation: Secondary | ICD-10-CM

## 2024-04-18 DIAGNOSIS — J8410 Pulmonary fibrosis, unspecified: Secondary | ICD-10-CM | POA: Diagnosis not present

## 2024-04-18 DIAGNOSIS — G4733 Obstructive sleep apnea (adult) (pediatric): Secondary | ICD-10-CM | POA: Diagnosis not present

## 2024-04-18 DIAGNOSIS — I700 Atherosclerosis of aorta: Secondary | ICD-10-CM | POA: Insufficient documentation

## 2024-04-18 DIAGNOSIS — R338 Other retention of urine: Secondary | ICD-10-CM

## 2024-04-18 DIAGNOSIS — E1122 Type 2 diabetes mellitus with diabetic chronic kidney disease: Secondary | ICD-10-CM | POA: Insufficient documentation

## 2024-04-18 DIAGNOSIS — C679 Malignant neoplasm of bladder, unspecified: Secondary | ICD-10-CM

## 2024-04-18 DIAGNOSIS — R001 Bradycardia, unspecified: Secondary | ICD-10-CM

## 2024-04-18 DIAGNOSIS — E785 Hyperlipidemia, unspecified: Secondary | ICD-10-CM

## 2024-04-18 DIAGNOSIS — N183 Chronic kidney disease, stage 3 unspecified: Secondary | ICD-10-CM | POA: Insufficient documentation

## 2024-04-18 DIAGNOSIS — J9622 Acute and chronic respiratory failure with hypercapnia: Secondary | ICD-10-CM

## 2024-04-18 NOTE — Progress Notes (Signed)
 Location:  [facility] Room Number: 159 Place of Service:  SNF (31)   CODE STATUS: full   Allergies  Allergen Reactions   Empagliflozin  Other (See Comments)    Urinary tract infectious disease **Jardiance **   Poison Ivy Extract [Poison Ivy Extract] Rash   Poison Oak Extract Rash    Chief Complaint  Patient presents with   Hospitalization Follow-up    HPI:  He is a 73 year old man who has been hospitalized from 04-11-24 through 04-17-24. 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 He had recently been hospitalized for  sepsis due to  UTI. He was discharged to this facility where he developed shortness of breath and altered mental status. He was found to have hypercapnia and started on BiPAP and was admitted.  Acute metabolic encephalopathy secondary to hypercapnia. He had significant improvement after starting BPAP; his neuro-imaging was negative for acute findings.  Acute on chronic hypoxic and hypercapnic respiratory failure: multifactorial  etiology none adherence to BIPAP; heart failure exacerbation and OSA/COPD contributing. He will need BiPAP nightly is on usual 02 at 4L.  Pneumonia: has completed 6-7 days of antibiotics was initially on rocephin  and doxycycline  then transitioned to augmentin .   He is here for short term rehab with his goal to return back home. He will continue to be followed for his chronic illnesses including:  Benign prostatic hyperplasia with urinary retention: Aortic atherosclerosis  Restless leg syndrome:  Past Medical History:  Diagnosis Date   Arthritis    Cancer (HCC)    bladder cancer currently 2016   CHF (congestive heart failure) (HCC)    Complication of anesthesia    hard for him to wake up from Anesthesia from left nephrectomy   COPD (chronic obstructive pulmonary disease) (HCC)    Coronary artery disease    Diabetes mellitus  without complication (HCC)    DVT (deep venous thrombosis) (HCC) 01/08/2014, 2010   upper extremity   GERD (gastroesophageal reflux disease)    Hallux limitus 05/18/2015   from notes from [facility]    Headache    migraines   History of kidney stones    Hypercholesterolemia    Hypertension    Impaired hearing    Intervertebral disc syndrome    Kidney stones    Pulmonary fibrosis (HCC)    Renal calculi 01/08/2014   frrom noted from [facility] .in chart   Renal insufficiency 06/19/2023   Restless legs 01/08/2014   Sciatic leg pain    paralysis of sciatic nerve   Sleep apnea    CPAP/BIPAP   Tinnitus     Past Surgical History:  Procedure Laterality Date   ABDOMINAL SURGERY     BACK SURGERY     BLADDER REPAIR     EYE SURGERY     HERNIA REPAIR     LEFT HEART CATHETERIZATION WITH CORONARY ANGIOGRAM N/A 01/09/2015   Procedure: LEFT HEART CATHETERIZATION WITH CORONARY ANGIOGRAM;  Surgeon: Sharene Dauer, MD;  Location: [facility] CATH LAB;  Service: Cardiovascular;  Laterality: N/A;   NEPHROURETERECTOMY  03/18/2016   with bladder cuff excision- from noted in chart from [facility]   TOTAL HIP ARTHROPLASTY Left 02/10/2017   Procedure: LEFT TOTAL HIP ARTHROPLASTY ANTERIOR APPROACH;  Surgeon: Arnie Lao, MD;  Location: [facility] ORS;  Service: Orthopedics;  Laterality: Left;    Social History   Socioeconomic History   Marital status:  Married    Spouse name: Not on file   Number of children: Not on file   Years of education: Not on file   Highest education level: Not on file  Occupational History   Not on file  Tobacco Use   Smoking status: Former    Current packs/day: 0.00    Average packs/day: 1 pack/day for 33.0 years (33.0 ttl pk-yrs)    Types: Cigarettes    Start date: 04/07/1967    Quit date: 04/06/2000    Years since quitting: 24.0   Smokeless tobacco: Never   Tobacco comments:    use to chew cigars  Substance and Sexual Activity   Alcohol use: Not Currently    Comment:  rarely   Drug use: Never    Comment: patient states does not use illegal drugs   Sexual activity: Not on file  Other Topics Concern   Not on file  Social History Narrative   Not on file   Social Drivers of Health   Financial Resource Strain: Not on file  Food Insecurity: No Food Insecurity (04/11/2024)   Hunger Vital Sign    Worried About Running Out of Food in the Last Year: Never true    Ran Out of Food in the Last Year: Never true  Transportation Needs: No Transportation Needs (04/11/2024)   PRAPARE - Administrator, Civil Service (Medical): No    Lack of Transportation (Non-Medical): No  Physical Activity: Not on file  Stress: Not on file  Social Connections: Moderately Isolated (04/11/2024)   Social Connection and Isolation Panel [NHANES]    Frequency of Communication with Friends and Family: Three times a week    Frequency of Social Gatherings with Friends and Family: Never    Attends Religious Services: Never    Database administrator or Organizations: No    Attends Banker Meetings: Never    Marital Status: Married  Catering manager Violence: Not At Risk (04/11/2024)   Humiliation, Afraid, Rape, and Kick questionnaire    Fear of Current or Ex-Partner: No    Emotionally Abused: No    Physically Abused: No    Sexually Abused: No   No family history on file.    VITAL SIGNS BP (!) 105/57   Pulse 66   Temp (!) 97.3 F (36.3 C)   Resp 20   Ht 5\' 8"  (1.727 m)   Wt 240 lb 9.6 oz (109.1 kg)   SpO2 96%   BMI 36.58 kg/m   Outpatient Encounter Medications as of 04/18/2024  Medication Sig   loratadine (CLARITIN) 10 MG tablet Take 10 mg by mouth daily as needed for allergies.   acetaminophen  (TYLENOL ) 325 MG tablet Take 325 mg by mouth in the morning and at bedtime.   albuterol  (PROVENTIL ) (2.5 MG/3ML) 0.083% nebulizer solution Take 3 mLs (2.5 mg total) by nebulization every 2 (two) hours as needed for wheezing or shortness of breath.   apixaban   (ELIQUIS ) 5 MG TABS tablet Take 1 tablet (5 mg total) by mouth 2 (two) times daily.   atorvastatin  (LIPITOR ) 80 MG tablet Take 40 mg by mouth at bedtime.   cholecalciferol  (VITAMIN D ) 1000 UNITS tablet Take 1,000 Units by mouth every morning.   Ensure Max Protein (ENSURE MAX PROTEIN) LIQD Take 330 mLs (11 oz total) by mouth 2 (two) times daily.   ferrous sulfate  325 (65 FE) MG tablet Take 325 mg by mouth 3 (three) times a week.   Fluticasone -Salmeterol (ADVAIR) 250-50  MCG/DOSE AEPB Inhale 1 puff into the lungs daily.   furosemide  (LASIX ) 20 MG tablet Take 2 tablets (40 mg total) by mouth daily.   gabapentin  (NEURONTIN ) 100 MG capsule Take 100 mg by mouth 2 (two) times daily.   Glucagon, rDNA, (GLUCAGON EMERGENCY) 1 MG KIT Inject 1 mg into the skin as needed (low bs).   insulin  regular human CONCENTRATED (HUMULIN R ) 500 UNIT/ML KwikPen Inject 15 Units into the skin See admin instructions. Inject 15 units subcutaneously with breakfast,15 units subcutaneously with lunch and 15 units subcutaneously with dinner.   ipratropium (ATROVENT ) 0.02 % nebulizer solution Take 2.5 mLs (0.5 mg total) by nebulization 2 (two) times daily.   levalbuterol  (XOPENEX ) 0.63 MG/3ML nebulizer solution Take 3 mLs (0.63 mg total) by nebulization 2 (two) times daily.   Multiple Vitamin (MULTIVITAMIN WITH MINERALS) TABS tablet Take 1 tablet by mouth daily.   OXYGEN Inhale 2-3 L/min into the lungs See admin instructions. 2 L/min continuous. May increase to 3 L/min as needed for shortness of breath.   rOPINIRole  (REQUIP ) 1 MG tablet Take 1 mg by mouth at bedtime.   sennosides-docusate sodium  (SENOKOT-S) 8.6-50 MG tablet Take 1 tablet by mouth daily as needed for constipation.   tamsulosin  (FLOMAX ) 0.4 MG CAPS capsule Take 0.8 mg by mouth at bedtime.   tiotropium (SPIRIVA  HANDIHALER) 18 MCG inhalation capsule Place 1 capsule (18 mcg total) into inhaler and inhale daily.   tirzepatide (MOUNJARO) 12.5 MG/0.5ML Pen Inject 12.5 mg  into the skin once a week.   No facility-administered encounter medications on file as of 04/18/2024.     SIGNIFICANT DIAGNOSTIC EXAMS  LABS REVIEWED;   04-11-24: wbc 5.7; hgb 10.8; hct 35.3; mcv 92.9 plt 178; glucose 141; bun 65; creat 2.36; k+ 4.8; na++ 138; ca 8.6 gfr 29 protein 6.4 albumin 2.3 04-17-24: wbc 3.8; hgb 10.6; hct 36.2; mcv 995.5 plt 150; glucose 164; bun 69; creat 2.23; k+ 4.5; na++ 137; ca 9.8; gfr 30   Review of Systems  Constitutional:  Negative for malaise/fatigue.  Respiratory:  Negative for cough and shortness of breath.   Cardiovascular:  Negative for chest pain, palpitations and leg swelling.  Gastrointestinal:  Negative for abdominal pain, constipation and heartburn.  Musculoskeletal:  Negative for back pain, joint pain and myalgias.  Skin: Negative.   Neurological:  Negative for dizziness.  Psychiatric/Behavioral:  The patient is not nervous/anxious.     Physical Exam Constitutional:      General: He is not in acute distress.    Appearance: He is well-developed. He is obese. He is not diaphoretic.  Neck:     Thyroid: No thyromegaly.  Cardiovascular:     Rate and Rhythm: Bradycardia present. Rhythm irregular.     Pulses: Normal pulses.     Heart sounds: Murmur heard.  Pulmonary:     Effort: Pulmonary effort is normal. No respiratory distress.     Breath sounds: Normal breath sounds.  Abdominal:     General: Bowel sounds are normal. There is no distension.     Palpations: Abdomen is soft.     Tenderness: There is no abdominal tenderness.  Musculoskeletal:        General: Normal range of motion.     Cervical back: Neck supple.     Right lower leg: Edema present.     Left lower leg: Edema present.  Lymphadenopathy:     Cervical: No cervical adenopathy.  Skin:    General: Skin is warm and dry.  Neurological:  Mental Status: He is alert. Mental status is at baseline.  Psychiatric:        Mood and Affect: Mood normal.      ASSESSMENT/  PLAN:  TODAY  Other emphysema/acute on chronic respiratory failure with hypoxia and hypercapnia/pulmonary fibrosis/OSA: is on chronic 02 at 4L and BiPAP nightly; will continue advair 250/50 1 puff daily; xopenex  neb treatment twice daily; spiriva  18 mcg daily; atrovent  neb twice daily; albuterol  neb treatment every 2 hours as needed has claritin 10 mg daily as needed  2. Chronic systolic congestive heart failure: will continue lasix  40 mg daily   3. Type 2 diabetes mellitus with chronic kidney disease stage 3 and hypertension: hgb A1c 7.8; will continue mounjaro 12.5 mg weekly; humulin R  15 units with meals is on statin and eliquis   4. Hypertension associated with stage 3 chronic kidney disease due to type 2 diabetes mellitus: b/p 105/57 will continue to monitor his status.   5. Permanent atrial fibrillation: heart rate is bradycardic; will continue eliquis  5 mg twice daily   6. Hyperlipidemia associated with type 2 diabetes mellitus: will continue lipitor  40 mg daily   7. Malignant neoplasm of bladder unspecified site: is followed by urology  8. Anemia, chronic renal failure stage 3: hgb 10.6 will continue iron three days weekly   9. Benign prostatic hyperplasia with urinary retention: has foley will need to be removed; will continue flomax  0.8 mg nightly   10. Aortic atherosclerosis (ct 05-11-23) is on statin  11. Restless leg syndrome: will continue requip  1 mg nightly   12. Diabetic peripheral neuropathy: will continue gabapentin  100 mg twice daily and tylenol  325 mg twice daily     Britt Candle NP [facility]   call [phone]

## 2024-04-19 ENCOUNTER — Non-Acute Institutional Stay (SKILLED_NURSING_FACILITY): Payer: Self-pay | Admitting: Internal Medicine

## 2024-04-19 ENCOUNTER — Other Ambulatory Visit (HOSPITAL_COMMUNITY)
Admission: RE | Admit: 2024-04-19 | Discharge: 2024-04-19 | Disposition: A | Discharge status: Home / Self Care | Source: Skilled Nursing Facility | Attending: Internal Medicine | Admitting: Internal Medicine

## 2024-04-19 ENCOUNTER — Encounter: Payer: Self-pay | Admitting: Internal Medicine

## 2024-04-19 DIAGNOSIS — R8281 Pyuria: Secondary | ICD-10-CM | POA: Diagnosis not present

## 2024-04-19 DIAGNOSIS — I129 Hypertensive chronic kidney disease with stage 1 through stage 4 chronic kidney disease, or unspecified chronic kidney disease: Secondary | ICD-10-CM

## 2024-04-19 DIAGNOSIS — J9601 Acute respiratory failure with hypoxia: Secondary | ICD-10-CM

## 2024-04-19 DIAGNOSIS — I4821 Permanent atrial fibrillation: Secondary | ICD-10-CM | POA: Diagnosis not present

## 2024-04-19 DIAGNOSIS — N390 Urinary tract infection, site not specified: Secondary | ICD-10-CM | POA: Insufficient documentation

## 2024-04-19 DIAGNOSIS — N183 Chronic kidney disease, stage 3 unspecified: Secondary | ICD-10-CM

## 2024-04-19 DIAGNOSIS — J9602 Acute respiratory failure with hypercapnia: Secondary | ICD-10-CM

## 2024-04-19 DIAGNOSIS — E1122 Type 2 diabetes mellitus with diabetic chronic kidney disease: Secondary | ICD-10-CM | POA: Diagnosis not present

## 2024-04-19 LAB — URINALYSIS, ROUTINE W REFLEX MICROSCOPIC
Bacteria, UA: NONE SEEN
Bilirubin Urine: NEGATIVE
Glucose, UA: 50 mg/dL — AB
Ketones, ur: NEGATIVE mg/dL
Nitrite: NEGATIVE
Protein, ur: 100 mg/dL — AB
RBC / HPF: >50 RBC/hpf (ref 0–5)
Specific Gravity, Urine: 1.013 (ref 1.005–1.030)
WBC, UA: >50 WBC/hpf (ref 0–5)
pH: 5 (ref 5.0–8.0)

## 2024-04-19 NOTE — Progress Notes (Signed)
 NURSING HOME LOCATION:  [facility] ROOM NUMBER:  159 P  CODE STATUS:  Full Code  PCP:  [facility] [place]  This is a comprehensive admission note to this SNFperformed on this date less than 30 days from date of admission. Included are preadmission medical/surgical history; reconciled medication list; family history; social history and comprehensive review of systems.  Corrections and additions to the records were documented. Comprehensive physical exam was also performed. Additionally a clinical summary was entered for each active diagnosis pertinent to this admission in the Problem List to enhance continuity of care.  HPI: He was originally hospitalized 5/11 - 04/09/2024 for sepsis in the context of oxygen dependent COPD and OSA.  He had presented with 3-day history of progressive weakness and confusion associated with increased shortness of breath and progressive lower extremity edema.  He also was experiencing anorexia with decreased p.o. intake.  Sepsis criteria were met with a blood pressure of 104/48, pulse of 58, and respiratory rate of 29.  During that hospitalization troponin peaked at 26.  Chest x-ray revealed cardiomegaly with vascular congestion and question pulmonary edema.  Blood cultures did show staph but contamination was questioned.  Course was also complicated by hypoglycemia prompting decreasing his 3 times daily Humulin R  dosage.  Broad-spectrum antibiotics were transitioned to oral antibiotic regimen. AKI was documented by nadir eGFR of 21; eGFR had been 52 in July. He was stabilized and was discharged to SNF for rehab. He was rehospitalized 5/15 - 5/21 because of CO2 retention.  There was question of nonadherence to BiPAP at the SNF.  He was placed on BiPAP and antibiotics continued.  He did stabilize.  While hospitalized glucoses ranged from 130 to a high of 265, both were outliers.  His CKD was relatively stable.  The final creatinine was 2.23; peak creatinine  during hospitalization was 2.36.  GFR final value was 30; the nadir value was 29. There was a prerenal component suggesting as well.  Final BUN was 69; nadir BUN was 56.  Anemia was stable with a final H/H of 10.6/36.2.  He is on iron 3 times a week.  There was a slight decrease in the MCHC.  White blood count at discharge was 3800.  His peak white count while hospitalized was 5700 and nadir value 3500.  Again he stabilized for discharge and readmission to the SNF to continue his rehab. Today staff reports that the urine in the Foley catheter is cloudy and malodorous and there was some suggestion of purulence around the penile meatus. Epic microbiology reports reviewed.  On 04/03/2024 urine culture revealed 80,000 colonies of Enterococcus faecalis sensitive to ampicillin , nitrofurantoin, and vancomycin .  Past medical and surgical history is long & complicated and includes CAD, history of congestive heart failure, oxygen dependent COPD, OSA, diabetes with vascular complications, history of DVT, GERD, history of nephrolithiasis, IPF, and CKD. Surgeries and procedures include back surgery, bladder repair, hernia repair, coronary angiography, nephro ureterotomy, and THA.  Family history: reviewed, non contributory due to advanced age.  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  He does not drink currently.  He smoked a total of 33 pack years. He began smoking age 44.   Review of systems: Some neurocognitive issues are suggested as he could not tell me why he was readmitted to the hospital 5/15 - 5/21 after he was admitted to the SNF on 5/13.  His response was "got worse, don't know  why."  He denies snoring or apnea and states he is compliant with the BiPAP.  He does have dysphagia unless he eats slowly and chews his food well.  His wife states a Foley catheter was placed because of urgency, nocturia, and incontinence.  He has a history of nephrectomy for bladder & kidney  malignancy and is on BCG according to his wife.  He sees [facility].  He does have some numbness in his lower extremities.  He also has a diagnosis of RLS.  His wife states that he was diagnosed with A-fib several months ago.  He states that his appetite is "so-so"; wife states that he lost 20 pounds while hospitalized in context of the anorexia and the aggressive diuresis.  Constitutional: No fever  Eyes: No redness, discharge, pain, vision change ENT/mouth: No nasal congestion, purulent discharge, earache, change in hearing, sore throat  Cardiovascular: No chest pain,  paroxysmal nocturnal dyspnea, claudication Respiratory: No cough, sputum production, hemoptysis Gastrointestinal: No heartburn, abdominal pain, nausea /vomiting, rectal bleeding, melena, change in bowels Genitourinary: No dysuria, hematuria Musculoskeletal: No joint stiffness, joint swelling, pain Dermatologic: No rash, pruritus, change in appearance of skin Neurologic: No dizziness, headache, syncope, seizures Psychiatric: No significant anxiety, depression, insomnia Endocrine: No change in hair/skin/nails, excessive thirst, excessive hunger, excessive urination  Hematologic/lymphatic: No significant bruising, lymphadenopathy, abnormal bleeding Allergy/immunology: No itchy/watery eyes, significant sneezing, urticaria, angioedema  Physical exam:  Pertinent or positive findings:He appears chronically ill.  He is hard of hearing.  Hair is disheveled and he is unshaven.  He has a beard and mustache which is poorly kempt.  Slight ptosis is noted on the left.  He has complete dentures.  Minor rales are noted on auscultation.  He also has low-grade rhonchi on expiration.  Heart rate is slow and slightly irregular and heart sounds are somewhat distant.  Central obesity is present with protuberance of the abdomen.  There is trace edema at the sock line.  Pedal pulses are nonpalpable. Urine in the catheter & the Foley bag is not  cloudy per my assessment.  I also did not see any purulence at the penile meatus but he screamed in pain as I tried to pull the diaper off to evaluate the penile meatus.  General appearance:no acute distress, increased work of breathing is present.   Lymphatic: No lymphadenopathy about the head, neck, axilla. Eyes: No conjunctival inflammation or lid edema is present. There is no scleral icterus. Ears:  External ear exam shows no significant lesions or deformities.   Nose:  External nasal examination shows no deformity or inflammation. Nasal mucosa are pink and moist without lesions, exudates Neck:  No thyromegaly, masses, tenderness noted.    Heart:  No gallop, murmur, click, rub.  Lungs:  without wheezes, rales, rubs. Abdomen: Bowel sounds are normal.  Abdomen is soft and nontender with no organomegaly, hernias, masses. GU: Deferred  Extremities:  No cyanosis, clubbing. Neurologic exam: Balance, Rhomberg, finger to nose testing could not be completed due to clinical state Skin: Warm & dry w/o tenting. No significant lesions or rash.  See clinical summary under each active problem in the Problem List with associated updated therapeutic plan

## 2024-04-19 NOTE — Assessment & Plan Note (Addendum)
 04/19/2024 staff reports cloudy urine with suggestion of some purulence around the penile meatus.  Urinalysis and culture & sensitivity will be performed.  Foley will be discontinued.  Bactroban  twice daily to the penile meatus will be ordered. Pending results of C&S, nitrofurantoin 100 mg twice daily will be initiated as the most recent positive culture on 5/7 revealed 80,000 colonies of Enterococcus faecalis sensitive to ampicillin , nitrofurantoin, and vancomycin .

## 2024-04-19 NOTE — Patient Instructions (Addendum)
 See assessment and plan under each diagnosis in the problem list and acutely for this visit:  A-fib [facility]) Rhythm is slightly irregular; rate is adequately controlled.  Continue to monitor & continue Eliquis  prophylaxis.  Acute respiratory failure with hypoxia and hypercapnia (HCC) Critical need for BiPAP adherence discussed with his wife.  Type 2 diabetes mellitus with stage 3 chronic kidney disease and hypertension (HCC) BP controlled; no change in antihypertensive medications While hospitalized glucoses ranged from a low of 130 To high of 265, both are outliers.  A1c on 5/7 was 7.8% indicating adequate control.  Goal would be less than 8%; it is critical to avoid hypoglycemia as this would mimic TIAs. Final creatinine was 2.23; peak creatinine during hospitalization was 2.36.  Final GFR was 30 and nadir value 29.  This indicates low stage IIIb CKD.  Pyuria 04/19/2024 staff reports cloudy urine with suggestion of some purulence around the penile meatus.  Urinalysis and culture & sensitivity will be performed.  Foley will be discontinued.  Bactroban  twice daily to the penile meatus will be ordered. Pending results of C&S, nitrofurantoin 100 mg twice daily will be initiated as the most recent positive culture on 5/7 revealed 80,000 colonies of Enterococcus faecalis sensitive to ampicillin , nitrofurantoin, and vancomycin .

## 2024-04-19 NOTE — Assessment & Plan Note (Signed)
 Critical need for BiPAP adherence discussed with his wife.

## 2024-04-19 NOTE — Assessment & Plan Note (Addendum)
 Rhythm is slightly irregular; rate is adequately controlled.  Continue to monitor & continue Eliquis  prophylaxis.

## 2024-04-19 NOTE — Assessment & Plan Note (Addendum)
 BP controlled; no change in antihypertensive medications While hospitalized glucoses ranged from a low of 130 To high of 265, both are outliers.  A1c on 5/7 was 7.8% indicating adequate control.  Goal would be less than 8%; it is critical to avoid hypoglycemia as this would mimic TIAs. Final creatinine was 2.23; peak creatinine during hospitalization was 2.36.  Final GFR was 30 and nadir value 29.  This indicates low stage IIIb CKD.

## 2024-04-20 LAB — URINE CULTURE: Culture: NO GROWTH

## 2024-04-23 ENCOUNTER — Non-Acute Institutional Stay (SKILLED_NURSING_FACILITY): Payer: Self-pay | Admitting: Adult Health

## 2024-04-23 ENCOUNTER — Encounter: Payer: Self-pay | Admitting: Adult Health

## 2024-04-23 DIAGNOSIS — I129 Hypertensive chronic kidney disease with stage 1 through stage 4 chronic kidney disease, or unspecified chronic kidney disease: Secondary | ICD-10-CM | POA: Diagnosis not present

## 2024-04-23 DIAGNOSIS — N183 Chronic kidney disease, stage 3 unspecified: Secondary | ICD-10-CM

## 2024-04-23 DIAGNOSIS — E1122 Type 2 diabetes mellitus with diabetic chronic kidney disease: Secondary | ICD-10-CM | POA: Diagnosis not present

## 2024-04-23 NOTE — Progress Notes (Signed)
 Location:  [facility] Room Number: 159 Place of Service:  SNF (31)   CODE STATUS: full   Allergies  Allergen Reactions   Empagliflozin  Other (See Comments)    Urinary tract infectious disease **Jardiance **   Poison Ivy Extract [Poison Ivy Extract] Rash   Poison Oak Extract Rash    Chief Complaint  Patient presents with   Acute Visit    Diabetes     HPI:  All of his cbg readings are elevated from 200-400's. He is presently taking 15 units humulin R  with meals. He had been taking 30 units at home. He had been taking mounjaro 12.5 mg weekly at home. There are no reports of excessive hunger or thirst.   Past Medical History:  Diagnosis Date   Arthritis    Cancer (HCC)    bladder cancer currently 2016   CHF (congestive heart failure) (HCC)    Complication of anesthesia    hard for him to wake up from Anesthesia from left nephrectomy   COPD (chronic obstructive pulmonary disease) (HCC)    Coronary artery disease    Diabetes mellitus without complication (HCC)    DVT (deep venous thrombosis) (HCC) 01/08/2014, 2010   upper extremity   GERD (gastroesophageal reflux disease)    Hallux limitus 05/18/2015   from notes from [facility]    Headache    migraines   History of kidney stones    Hypercholesterolemia    Hypertension    Impaired hearing    Intervertebral disc syndrome    Kidney stones    Pulmonary fibrosis (HCC)    Renal calculi 01/08/2014   frrom noted from [facility] .in chart   Renal insufficiency 06/19/2023   Restless legs 01/08/2014   Sciatic leg pain    paralysis of sciatic nerve   Sleep apnea    CPAP/BIPAP   Tinnitus     Past Surgical History:  Procedure Laterality Date   ABDOMINAL SURGERY     BACK SURGERY     BLADDER REPAIR     EYE SURGERY     HERNIA REPAIR     LEFT HEART CATHETERIZATION WITH CORONARY ANGIOGRAM N/A 01/09/2015   Procedure: LEFT HEART CATHETERIZATION WITH CORONARY ANGIOGRAM;  Surgeon: Sharene Dauer, MD;   Location: [facility] CATH LAB;  Service: Cardiovascular;  Laterality: N/A;   NEPHROURETERECTOMY  03/18/2016   with bladder cuff excision- from noted in chart from [facility]   TOTAL HIP ARTHROPLASTY Left 02/10/2017   Procedure: LEFT TOTAL HIP ARTHROPLASTY ANTERIOR APPROACH;  Surgeon: Arnie Lao, MD;  Location: [facility] ORS;  Service: Orthopedics;  Laterality: Left;    Social History   Socioeconomic History   Marital status: Married    Spouse name: Not on file   Number of children: Not on file   Years of education: Not on file   Highest education level: Not on file  Occupational History   Not on file  Tobacco Use   Smoking status: Former    Current packs/day: 0.00    Average packs/day: 1 pack/day for 33.0 years (33.0 ttl pk-yrs)    Types: Cigarettes    Start date: 04/07/1967    Quit date: 04/06/2000    Years since quitting: 24.0   Smokeless tobacco: Never   Tobacco comments:    use to chew cigars  Substance and Sexual Activity   Alcohol use: Not Currently    Comment: rarely   Drug use: Never    Comment: patient states does not  use illegal drugs   Sexual activity: Not on file  Other Topics Concern   Not on file  Social History Narrative   Not on file   Social Drivers of Health   Financial Resource Strain: Not on file  Food Insecurity: No Food Insecurity (04/11/2024)   Hunger Vital Sign    Worried About Running Out of Food in the Last Year: Never true    Ran Out of Food in the Last Year: Never true  Transportation Needs: No Transportation Needs (04/11/2024)   PRAPARE - Administrator, Civil Service (Medical): No    Lack of Transportation (Non-Medical): No  Physical Activity: Not on file  Stress: Not on file  Social Connections: Moderately Isolated (04/11/2024)   Social Connection and Isolation Panel [NHANES]    Frequency of Communication with Friends and Family: Three times a week    Frequency of Social Gatherings with Friends and Family: Never    Attends  Religious Services: Never    Database administrator or Organizations: No    Attends Banker Meetings: Never    Marital Status: Married  Catering manager Violence: Not At Risk (04/11/2024)   Humiliation, Afraid, Rape, and Kick questionnaire    Fear of Current or Ex-Partner: No    Emotionally Abused: No    Physically Abused: No    Sexually Abused: No   No family history on file.    VITAL SIGNS BP (!) 124/56   Pulse (!) 59   Temp 98.6 F (37 C)   Resp 20   Ht 5\' 8"  (1.727 m)   Wt 238 lb 9.6 oz (108.2 kg)   SpO2 96%   BMI 36.28 kg/m   Outpatient Encounter Medications as of 04/23/2024  Medication Sig Note   acetaminophen  (TYLENOL ) 325 MG tablet Take 325 mg by mouth in the morning and at bedtime.    albuterol  (PROVENTIL ) (2.5 MG/3ML) 0.083% nebulizer solution Take 3 mLs (2.5 mg total) by nebulization every 2 (two) hours as needed for wheezing or shortness of breath.    apixaban  (ELIQUIS ) 5 MG TABS tablet Take 1 tablet (5 mg total) by mouth 2 (two) times daily.    atorvastatin  (LIPITOR ) 80 MG tablet Take 40 mg by mouth at bedtime.    cholecalciferol  (VITAMIN D ) 1000 UNITS tablet Take 1,000 Units by mouth every morning.    Ensure Max Protein (ENSURE MAX PROTEIN) LIQD Take 330 mLs (11 oz total) by mouth 2 (two) times daily.    ferrous sulfate  325 (65 FE) MG tablet Take 325 mg by mouth 3 (three) times a week.    Fluticasone -Salmeterol (ADVAIR) 250-50 MCG/DOSE AEPB Inhale 1 puff into the lungs daily.    furosemide  (LASIX ) 20 MG tablet Take 2 tablets (40 mg total) by mouth daily.    gabapentin  (NEURONTIN ) 100 MG capsule Take 100 mg by mouth 2 (two) times daily.    Glucagon, rDNA, (GLUCAGON EMERGENCY) 1 MG KIT Inject 1 mg into the skin as needed (low bs).    insulin  regular human CONCENTRATED (HUMULIN R ) 500 UNIT/ML KwikPen Inject 15 Units into the skin See admin instructions. Inject 15 units subcutaneously with breakfast,15 units subcutaneously with lunch and 15 units  subcutaneously with dinner.    ipratropium (ATROVENT ) 0.02 % nebulizer solution Take 2.5 mLs (0.5 mg total) by nebulization 2 (two) times daily.    levalbuterol  (XOPENEX ) 0.63 MG/3ML nebulizer solution Take 3 mLs (0.63 mg total) by nebulization 2 (two) times daily.    loratadine (  CLARITIN) 10 MG tablet Take 10 mg by mouth daily as needed for allergies.    Multiple Vitamin (MULTIVITAMIN WITH MINERALS) TABS tablet Take 1 tablet by mouth daily.    OXYGEN Inhale 2-3 L/min into the lungs See admin instructions. 2 L/min continuous. May increase to 3 L/min as needed for shortness of breath.    rOPINIRole  (REQUIP ) 1 MG tablet Take 1 mg by mouth at bedtime.    sennosides-docusate sodium  (SENOKOT-S) 8.6-50 MG tablet Take 1 tablet by mouth daily as needed for constipation.    tamsulosin  (FLOMAX ) 0.4 MG CAPS capsule Take 0.8 mg by mouth at bedtime.    tiotropium (SPIRIVA  HANDIHALER) 18 MCG inhalation capsule Place 1 capsule (18 mcg total) into inhaler and inhale daily.    tirzepatide (MOUNJARO) 12.5 MG/0.5ML Pen Inject 12.5 mg into the skin once a week. 04/19/2024: Mounjaro is being held at the SNF as he describes ongoing anorexia.  This has been associated with with a 20 pound weight loss while hospitalized 5/7 - 5/13 and again 5/15 - 04/17/2024.  This is also in the context of aggressive diuresis.  Anorexia has improved but the impact on gastric emptying of this medication warrants holding until he is stable.  This was discussed with his wife.   No facility-administered encounter medications on file as of 04/23/2024.     SIGNIFICANT DIAGNOSTIC EXAMS  LABS REVIEWED;   04-11-24: wbc 5.7; hgb 10.8; hct 35.3; mcv 92.9 plt 178; glucose 141; bun 65; creat 2.36; k+ 4.8; na++ 138; ca 8.6 gfr 29 protein 6.4 albumin 2.3 04-17-24: wbc 3.8; hgb 10.6; hct 36.2; mcv 995.5 plt 150; glucose 164; bun 69; creat 2.23; k+ 4.5; na++ 137; ca 9.8; gfr 30   Review of Systems  Constitutional:  Negative for malaise/fatigue.   Respiratory:  Negative for cough and shortness of breath.   Cardiovascular:  Negative for chest pain, palpitations and leg swelling.  Gastrointestinal:  Negative for abdominal pain, constipation and heartburn.  Musculoskeletal:  Negative for back pain, joint pain and myalgias.  Skin: Negative.   Neurological:  Negative for dizziness.  Psychiatric/Behavioral:  The patient is not nervous/anxious.    Physical Exam Constitutional:      General: He is not in acute distress.    Appearance: He is well-developed. He is obese. He is not diaphoretic.  Neck:     Thyroid: No thyromegaly.  Cardiovascular:     Rate and Rhythm: Bradycardia present. Rhythm irregular.     Pulses: Normal pulses.     Heart sounds: Murmur heard.  Pulmonary:     Effort: Pulmonary effort is normal. No respiratory distress.     Breath sounds: Normal breath sounds.  Abdominal:     General: Bowel sounds are normal. There is no distension.     Palpations: Abdomen is soft.     Tenderness: There is no abdominal tenderness.  Musculoskeletal:        General: Normal range of motion.     Cervical back: Neck supple.     Right lower leg: Edema present.     Left lower leg: Edema present.  Lymphadenopathy:     Cervical: No cervical adenopathy.  Skin:    General: Skin is warm and dry.  Neurological:     Mental Status: He is alert. Mental status is at baseline.  Psychiatric:        Mood and Affect: Mood normal.      ASSESSMENT/ PLAN:  TODAY  Type 2 diabetes mellitus with stage 3 chronic  kidney disease and hypertension: will return his humulin R  to 30 units with meals and will restart mounjaro 12.5 mg weekly    Britt Candle NP [facility]   call [phone]

## 2024-04-30 ENCOUNTER — Non-Acute Institutional Stay (SKILLED_NURSING_FACILITY): Payer: Self-pay | Admitting: Adult Health

## 2024-04-30 ENCOUNTER — Encounter: Payer: Self-pay | Admitting: Adult Health

## 2024-04-30 DIAGNOSIS — I129 Hypertensive chronic kidney disease with stage 1 through stage 4 chronic kidney disease, or unspecified chronic kidney disease: Secondary | ICD-10-CM

## 2024-04-30 DIAGNOSIS — N183 Chronic kidney disease, stage 3 unspecified: Secondary | ICD-10-CM | POA: Diagnosis not present

## 2024-04-30 DIAGNOSIS — E1122 Type 2 diabetes mellitus with diabetic chronic kidney disease: Secondary | ICD-10-CM

## 2024-04-30 NOTE — Progress Notes (Signed)
 Location:  [facility] Room Number: 159 Place of Service:  SNF (31)   CODE STATUS: full   Allergies  Allergen Reactions   Empagliflozin  Other (See Comments)    Urinary tract infectious disease **Jardiance **   Poison Ivy Extract [Poison Ivy Extract] Rash   Poison Oak Extract Rash    Chief Complaint  Patient presents with   Acute Visit    Diabetes     HPI:  Her cbg readings are elevated nearly all of them are in the upper 200's to 400's. He is presently taking humulin R  (500/mL) 30 units with meals and mounjaro 12.5 mg weekly. Will need to begin a basal insulin .   Past Medical History:  Diagnosis Date   Arthritis    Cancer (HCC)    bladder cancer currently 2016   CHF (congestive heart failure) (HCC)    Complication of anesthesia    hard for him to wake up from Anesthesia from left nephrectomy   COPD (chronic obstructive pulmonary disease) (HCC)    Coronary artery disease    Diabetes mellitus without complication (HCC)    DVT (deep venous thrombosis) (HCC) 01/08/2014, 2010   upper extremity   GERD (gastroesophageal reflux disease)    Hallux limitus 05/18/2015   from notes from [facility]    Headache    migraines   History of kidney stones    Hypercholesterolemia    Hypertension    Impaired hearing    Intervertebral disc syndrome    Kidney stones    Pulmonary fibrosis (HCC)    Renal calculi 01/08/2014   frrom noted from [facility] .in chart   Renal insufficiency 06/19/2023   Restless legs 01/08/2014   Sciatic leg pain    paralysis of sciatic nerve   Sleep apnea    CPAP/BIPAP   Tinnitus     Past Surgical History:  Procedure Laterality Date   ABDOMINAL SURGERY     BACK SURGERY     BLADDER REPAIR     EYE SURGERY     HERNIA REPAIR     LEFT HEART CATHETERIZATION WITH CORONARY ANGIOGRAM N/A 01/09/2015   Procedure: LEFT HEART CATHETERIZATION WITH CORONARY ANGIOGRAM;  Surgeon: Sharene Dauer, MD;  Location: [facility] CATH LAB;  Service:  Cardiovascular;  Laterality: N/A;   NEPHROURETERECTOMY  03/18/2016   with bladder cuff excision- from noted in chart from [facility]   TOTAL HIP ARTHROPLASTY Left 02/10/2017   Procedure: LEFT TOTAL HIP ARTHROPLASTY ANTERIOR APPROACH;  Surgeon: Arnie Lao, MD;  Location: [facility] ORS;  Service: Orthopedics;  Laterality: Left;    Social History   Socioeconomic History   Marital status: Married    Spouse name: Not on file   Number of children: Not on file   Years of education: Not on file   Highest education level: Not on file  Occupational History   Not on file  Tobacco Use   Smoking status: Former    Current packs/day: 0.00    Average packs/day: 1 pack/day for 33.0 years (33.0 ttl pk-yrs)    Types: Cigarettes    Start date: 04/07/1967    Quit date: 04/06/2000    Years since quitting: 24.0   Smokeless tobacco: Never   Tobacco comments:    use to chew cigars  Substance and Sexual Activity   Alcohol use: Not Currently    Comment: rarely   Drug use: Never    Comment: patient states does not use illegal drugs   Sexual activity:  Not on file  Other Topics Concern   Not on file  Social History Narrative   Not on file   Social Drivers of Health   Financial Resource Strain: Not on file  Food Insecurity: No Food Insecurity (04/11/2024)   Hunger Vital Sign    Worried About Running Out of Food in the Last Year: Never true    Ran Out of Food in the Last Year: Never true  Transportation Needs: No Transportation Needs (04/11/2024)   PRAPARE - Administrator, Civil Service (Medical): No    Lack of Transportation (Non-Medical): No  Physical Activity: Not on file  Stress: Not on file  Social Connections: Moderately Isolated (04/11/2024)   Social Connection and Isolation Panel [NHANES]    Frequency of Communication with Friends and Family: Three times a week    Frequency of Social Gatherings with Friends and Family: Never    Attends Religious Services: Never    Automotive engineer or Organizations: No    Attends Banker Meetings: Never    Marital Status: Married  Catering manager Violence: Not At Risk (04/11/2024)   Humiliation, Afraid, Rape, and Kick questionnaire    Fear of Current or Ex-Partner: No    Emotionally Abused: No    Physically Abused: No    Sexually Abused: No   No family history on file.    VITAL SIGNS BP (!) 105/41   Pulse 78   Temp (!) 97.1 F (36.2 C)   Resp 18   Ht 5\' 8"  (1.727 m)   Wt 231 lb 12.8 oz (105.1 kg)   SpO2 98%   BMI 35.25 kg/m   Outpatient Encounter Medications as of 04/30/2024  Medication Sig   acetaminophen  (TYLENOL ) 325 MG tablet Take 325 mg by mouth in the morning and at bedtime.   albuterol  (PROVENTIL ) (2.5 MG/3ML) 0.083% nebulizer solution Take 3 mLs (2.5 mg total) by nebulization every 2 (two) hours as needed for wheezing or shortness of breath.   apixaban  (ELIQUIS ) 5 MG TABS tablet Take 1 tablet (5 mg total) by mouth 2 (two) times daily.   atorvastatin  (LIPITOR ) 80 MG tablet Take 40 mg by mouth at bedtime.   cholecalciferol  (VITAMIN D ) 1000 UNITS tablet Take 1,000 Units by mouth every morning.   Ensure Max Protein (ENSURE MAX PROTEIN) LIQD Take 330 mLs (11 oz total) by mouth 2 (two) times daily.   ferrous sulfate  325 (65 FE) MG tablet Take 325 mg by mouth 3 (three) times a week.   Fluticasone -Salmeterol (ADVAIR) 250-50 MCG/DOSE AEPB Inhale 1 puff into the lungs daily.   furosemide  (LASIX ) 20 MG tablet Take 2 tablets (40 mg total) by mouth daily.   gabapentin  (NEURONTIN ) 100 MG capsule Take 100 mg by mouth 2 (two) times daily.   Glucagon, rDNA, (GLUCAGON EMERGENCY) 1 MG KIT Inject 1 mg into the skin as needed (low bs).   insulin  regular human CONCENTRATED (HUMULIN R ) 500 UNIT/ML injection Inject 30 Units into the skin 3 (three) times daily with meals.   ipratropium (ATROVENT ) 0.02 % nebulizer solution Take 2.5 mLs (0.5 mg total) by nebulization 2 (two) times daily.   levalbuterol  (XOPENEX )  0.63 MG/3ML nebulizer solution Take 3 mLs (0.63 mg total) by nebulization 2 (two) times daily.   loratadine (CLARITIN) 10 MG tablet Take 10 mg by mouth daily as needed for allergies.   Multiple Vitamin (MULTIVITAMIN WITH MINERALS) TABS tablet Take 1 tablet by mouth daily.   OXYGEN Inhale 2-3 L/min  into the lungs See admin instructions. 2 L/min continuous. May increase to 3 L/min as needed for shortness of breath.   rOPINIRole  (REQUIP ) 1 MG tablet Take 1 mg by mouth at bedtime.   sennosides-docusate sodium  (SENOKOT-S) 8.6-50 MG tablet Take 1 tablet by mouth daily as needed for constipation.   tamsulosin  (FLOMAX ) 0.4 MG CAPS capsule Take 0.8 mg by mouth at bedtime.   tiotropium (SPIRIVA  HANDIHALER) 18 MCG inhalation capsule Place 1 capsule (18 mcg total) into inhaler and inhale daily.   tirzepatide (MOUNJARO) 12.5 MG/0.5ML Pen Inject 12.5 mg into the skin once a week.   No facility-administered encounter medications on file as of 04/30/2024.     SIGNIFICANT DIAGNOSTIC EXAMS  LABS REVIEWED;   04-11-24: wbc 5.7; hgb 10.8; hct 35.3; mcv 92.9 plt 178; glucose 141; bun 65; creat 2.36; k+ 4.8; na++ 138; ca 8.6 gfr 29 protein 6.4 albumin 2.3 04-17-24: wbc 3.8; hgb 10.6; hct 36.2; mcv 995.5 plt 150; glucose 164; bun 69; creat 2.23; k+ 4.5; na++ 137; ca 9.8; gfr 30   Review of Systems  Constitutional:  Negative for malaise/fatigue.  Respiratory:  Negative for cough and shortness of breath.   Cardiovascular:  Negative for chest pain, palpitations and leg swelling.  Gastrointestinal:  Negative for abdominal pain, constipation and heartburn.  Musculoskeletal:  Negative for back pain, joint pain and myalgias.  Skin: Negative.   Neurological:  Negative for dizziness.  Psychiatric/Behavioral:  The patient is not nervous/anxious.    Physical Exam Constitutional:      General: He is not in acute distress.    Appearance: He is well-developed. He is obese. He is not diaphoretic.  Neck:     Thyroid: No  thyromegaly.  Cardiovascular:     Rate and Rhythm: Normal rate. Rhythm irregular.     Pulses: Normal pulses.     Heart sounds: Murmur heard.  Pulmonary:     Effort: Pulmonary effort is normal. No respiratory distress.     Breath sounds: Normal breath sounds.  Abdominal:     General: Bowel sounds are normal. There is no distension.     Palpations: Abdomen is soft.     Tenderness: There is no abdominal tenderness.  Musculoskeletal:        General: Normal range of motion.     Cervical back: Neck supple.     Right lower leg: No edema.     Left lower leg: No edema.  Lymphadenopathy:     Cervical: No cervical adenopathy.  Skin:    General: Skin is warm and dry.  Neurological:     Mental Status: He is alert. Mental status is at baseline.  Psychiatric:        Mood and Affect: Mood normal.      ASSESSMENT/ PLAN:  TODAY  Type 2 diabetes mellitus with stage 3 chronic kidney disease and hypertension: his CBG readings remain elevated will continue humulin R  (500u/mL) 30 units with meals; mounjaro 12.5 mg weekly and will begin lantus  15 units nightly    Britt Candle NP [facility]   call [phone]

## 2024-05-03 ENCOUNTER — Non-Acute Institutional Stay (SKILLED_NURSING_FACILITY): Payer: Self-pay | Admitting: Adult Health

## 2024-05-03 ENCOUNTER — Encounter: Payer: Self-pay | Admitting: Adult Health

## 2024-05-03 DIAGNOSIS — E1122 Type 2 diabetes mellitus with diabetic chronic kidney disease: Secondary | ICD-10-CM | POA: Diagnosis not present

## 2024-05-03 DIAGNOSIS — I129 Hypertensive chronic kidney disease with stage 1 through stage 4 chronic kidney disease, or unspecified chronic kidney disease: Secondary | ICD-10-CM | POA: Diagnosis not present

## 2024-05-03 DIAGNOSIS — J8410 Pulmonary fibrosis, unspecified: Secondary | ICD-10-CM | POA: Diagnosis not present

## 2024-05-03 DIAGNOSIS — N183 Chronic kidney disease, stage 3 unspecified: Secondary | ICD-10-CM

## 2024-05-03 DIAGNOSIS — I700 Atherosclerosis of aorta: Secondary | ICD-10-CM | POA: Diagnosis not present

## 2024-05-03 NOTE — Progress Notes (Signed)
 Location:  [facility] Room Number: 159P Place of Service:  SNF (31)   CODE STATUS: FULL  Allergies  Allergen Reactions   Empagliflozin  Other (See Comments)    Urinary tract infectious disease **Jardiance **   Poison Ivy Extract [Poison Ivy Extract] Rash   Poison Oak Extract Rash    Chief Complaint  Patient presents with   Acute Visit    Care plan meeting     HPI:  We have come together for his care plan meeting. Family present. BIMS 12/15 mood 6/30: decreased energy, some depression. He uses wheelchair without falls. He requires max assist to dependent assist with his adl care. He has decreased ROM in lower extremities. He is incontinent of bladder and bowel. Dietary: regular diet: 76-100% appetite weight 240 pounds CBG readings remain elevated but are improving to the 200-300 range; supplements have been stopped. Therapy: ambulate 150 feet with rolling walker and contact guard; transfers supervision to contact guard; upper body min assist; lower body min assist steps max assist; car transfer: min assist; BRP: supervision to max assist with hygiene. He continues to be followed for his chronic illnesses including: Aortic atherosclerosis  Pulmonary fibrosis  Type 2 diabetes mellitus with stage 3 chronic kidney disease and hypertension  Past Medical History:  Diagnosis Date   Arthritis    Cancer (HCC)    bladder cancer currently 2016   CHF (congestive heart failure) (HCC)    Complication of anesthesia    hard for him to wake up from Anesthesia from left nephrectomy   COPD (chronic obstructive pulmonary disease) (HCC)    Coronary artery disease    Diabetes mellitus without complication (HCC)    DVT (deep venous thrombosis) (HCC) 01/08/2014, 2010   upper extremity   GERD (gastroesophageal reflux disease)    Hallux limitus 05/18/2015   from notes from [facility]    Headache    migraines   History of kidney stones    Hypercholesterolemia    Hypertension     Impaired hearing    Intervertebral disc syndrome    Kidney stones    Pulmonary fibrosis (HCC)    Renal calculi 01/08/2014   frrom noted from [facility] .in chart   Renal insufficiency 06/19/2023   Restless legs 01/08/2014   Sciatic leg pain    paralysis of sciatic nerve   Sleep apnea    CPAP/BIPAP   Tinnitus     Past Surgical History:  Procedure Laterality Date   ABDOMINAL SURGERY     BACK SURGERY     BLADDER REPAIR     EYE SURGERY     HERNIA REPAIR     LEFT HEART CATHETERIZATION WITH CORONARY ANGIOGRAM N/A 01/09/2015   Procedure: LEFT HEART CATHETERIZATION WITH CORONARY ANGIOGRAM;  Surgeon: Sharene Dauer, MD;  Location: [facility] CATH LAB;  Service: Cardiovascular;  Laterality: N/A;   NEPHROURETERECTOMY  03/18/2016   with bladder cuff excision- from noted in chart from [facility]   TOTAL HIP ARTHROPLASTY Left 02/10/2017   Procedure: LEFT TOTAL HIP ARTHROPLASTY ANTERIOR APPROACH;  Surgeon: Arnie Lao, MD;  Location: [facility] ORS;  Service: Orthopedics;  Laterality: Left;    Social History   Socioeconomic History   Marital status: Married    Spouse name: Not on file   Number of children: Not on file   Years of education: Not on file   Highest education level: Not on file  Occupational History   Not on file  Tobacco Use  Smoking status: Former    Current packs/day: 0.00    Average packs/day: 1 pack/day for 33.0 years (33.0 ttl pk-yrs)    Types: Cigarettes    Start date: 04/07/1967    Quit date: 04/06/2000    Years since quitting: 24.0   Smokeless tobacco: Never   Tobacco comments:    use to chew cigars  Substance and Sexual Activity   Alcohol use: Not Currently    Comment: rarely   Drug use: Never    Comment: patient states does not use illegal drugs   Sexual activity: Not on file  Other Topics Concern   Not on file  Social History Narrative   Not on file   Social Drivers of Health   Financial Resource Strain: Not on file  Food Insecurity: No Food  Insecurity (04/11/2024)   Hunger Vital Sign    Worried About Running Out of Food in the Last Year: Never true    Ran Out of Food in the Last Year: Never true  Transportation Needs: No Transportation Needs (04/11/2024)   PRAPARE - Administrator, Civil Service (Medical): No    Lack of Transportation (Non-Medical): No  Physical Activity: Not on file  Stress: Not on file  Social Connections: Moderately Isolated (04/11/2024)   Social Connection and Isolation Panel [NHANES]    Frequency of Communication with Friends and Family: Three times a week    Frequency of Social Gatherings with Friends and Family: Never    Attends Religious Services: Never    Database administrator or Organizations: No    Attends Banker Meetings: Never    Marital Status: Married  Catering manager Violence: Not At Risk (04/11/2024)   Humiliation, Afraid, Rape, and Kick questionnaire    Fear of Current or Ex-Partner: No    Emotionally Abused: No    Physically Abused: No    Sexually Abused: No   History reviewed. No pertinent family history.    VITAL SIGNS BP (!) 105/41   Pulse 78   Temp (!) 97.1 F (36.2 C)   Resp 18   Ht 5\' 8"  (1.727 m)   Wt 240 lb 12.8 oz (109.2 kg)   SpO2 97%   BMI 36.61 kg/m   Outpatient Encounter Medications as of 05/03/2024  Medication Sig   acetaminophen  (TYLENOL ) 325 MG tablet Take 325 mg by mouth in the morning and at bedtime.   albuterol  (PROVENTIL ) (2.5 MG/3ML) 0.083% nebulizer solution Take 3 mLs (2.5 mg total) by nebulization every 2 (two) hours as needed for wheezing or shortness of breath.   apixaban  (ELIQUIS ) 5 MG TABS tablet Take 1 tablet (5 mg total) by mouth 2 (two) times daily.   atorvastatin  (LIPITOR ) 40 MG tablet Take 40 mg by mouth at bedtime.   ferrous sulfate  325 (65 FE) MG tablet Take 325 mg by mouth every Monday, Wednesday, and Friday.   furosemide  (LASIX ) 20 MG tablet Take 2 tablets (40 mg total) by mouth daily.   gabapentin  (NEURONTIN )  100 MG capsule Take 100 mg by mouth 2 (two) times daily.   Glucagon, rDNA, (GLUCAGON EMERGENCY) 1 MG KIT Inject 1 mg into the skin as needed (low bs).   insulin  glargine (LANTUS  SOLOSTAR) 100 UNIT/ML Solostar Pen Inject 15 Units into the skin at bedtime.   loratadine (CLARITIN) 10 MG tablet Take 10 mg by mouth daily as needed for allergies.   mupirocin  ointment (BACTROBAN ) 2 % 1 Application 2 (two) times daily.   cholecalciferol  (VITAMIN  D) 1000 UNITS tablet Take 1,000 Units by mouth every morning.   Ensure Max Protein (ENSURE MAX PROTEIN) LIQD Take 330 mLs (11 oz total) by mouth 2 (two) times daily.   Fluticasone -Salmeterol (ADVAIR) 250-50 MCG/DOSE AEPB Inhale 1 puff into the lungs daily.   insulin  regular human CONCENTRATED (HUMULIN R ) 500 UNIT/ML injection Inject 30 Units into the skin 3 (three) times daily with meals.   ipratropium (ATROVENT ) 0.02 % nebulizer solution Take 2.5 mLs (0.5 mg total) by nebulization 2 (two) times daily.   levalbuterol  (XOPENEX ) 0.63 MG/3ML nebulizer solution Take 3 mLs (0.63 mg total) by nebulization 2 (two) times daily.   Multiple Vitamin (MULTIVITAMIN WITH MINERALS) TABS tablet Take 1 tablet by mouth daily.   OXYGEN Inhale 2-3 L/min into the lungs See admin instructions. 2 L/min continuous. May increase to 3 L/min as needed for shortness of breath.   rOPINIRole  (REQUIP ) 1 MG tablet Take 1 mg by mouth at bedtime.   sennosides-docusate sodium  (SENOKOT-S) 8.6-50 MG tablet Take 1 tablet by mouth daily as needed for constipation.   tamsulosin  (FLOMAX ) 0.4 MG CAPS capsule Take 0.8 mg by mouth at bedtime.   tiotropium (SPIRIVA  HANDIHALER) 18 MCG inhalation capsule Place 1 capsule (18 mcg total) into inhaler and inhale daily.   tirzepatide (MOUNJARO) 12.5 MG/0.5ML Pen Inject 12.5 mg into the skin once a week.   [DISCONTINUED] atorvastatin  (LIPITOR ) 80 MG tablet Take 40 mg by mouth at bedtime. (Patient not taking: Reported on 05/03/2024)   No facility-administered  encounter medications on file as of 05/03/2024.     SIGNIFICANT DIAGNOSTIC EXAMS  LABS REVIEWED;   04-11-24: wbc 5.7; hgb 10.8; hct 35.3; mcv 92.9 plt 178; glucose 141; bun 65; creat 2.36; k+ 4.8; na++ 138; ca 8.6 gfr 29 protein 6.4 albumin 2.3 04-17-24: wbc 3.8; hgb 10.6; hct 36.2; mcv 995.5 plt 150; glucose 164; bun 69; creat 2.23; k+ 4.5; na++ 137; ca 9.8; gfr 30   Review of Systems  Constitutional:  Negative for malaise/fatigue.  Respiratory:  Negative for cough and shortness of breath.   Cardiovascular:  Negative for chest pain, palpitations and leg swelling.  Gastrointestinal:  Negative for abdominal pain, constipation and heartburn.  Musculoskeletal:  Negative for back pain, joint pain and myalgias.  Skin: Negative.   Neurological:  Negative for dizziness.  Psychiatric/Behavioral:  The patient is not nervous/anxious.     Physical Exam Constitutional:      General: He is not in acute distress.    Appearance: He is well-developed. He is obese. He is not diaphoretic.  Neck:     Thyroid: No thyromegaly.  Cardiovascular:     Rate and Rhythm: Normal rate. Rhythm irregular.     Pulses: Normal pulses.     Heart sounds: Murmur heard.  Pulmonary:     Effort: Pulmonary effort is normal. No respiratory distress.     Breath sounds: Normal breath sounds.  Abdominal:     General: Bowel sounds are normal. There is no distension.     Palpations: Abdomen is soft.     Tenderness: There is no abdominal tenderness.  Musculoskeletal:        General: Normal range of motion.     Cervical back: Neck supple.     Right lower leg: Edema present.     Left lower leg: Edema present.  Lymphadenopathy:     Cervical: No cervical adenopathy.  Skin:    General: Skin is warm and dry.  Neurological:     Mental Status: He  is alert. Mental status is at baseline.  Psychiatric:        Mood and Affect: Mood normal.      ASSESSMENT/ PLAN:  TODAY  Aortic atherosclerosis Pulmonary fibrosis Type 2  diabetes mellitus with stage 3 chronic kidney disease and hypertension  Will increase lantus  to 20 units nightly  Will continue therapy as directed Will continue to monitor his status.  Goal of care is to return back home  Time spent with patient: 40 minutes: therapy; goals of care; dietary; medications   Britt Candle NP [facility]   call [phone]

## 2024-05-09 ENCOUNTER — Non-Acute Institutional Stay (SKILLED_NURSING_FACILITY): Payer: Self-pay | Admitting: Adult Health

## 2024-05-09 ENCOUNTER — Encounter: Payer: Self-pay | Admitting: Adult Health

## 2024-05-09 DIAGNOSIS — N183 Chronic kidney disease, stage 3 unspecified: Secondary | ICD-10-CM | POA: Diagnosis not present

## 2024-05-09 DIAGNOSIS — E1122 Type 2 diabetes mellitus with diabetic chronic kidney disease: Secondary | ICD-10-CM

## 2024-05-09 DIAGNOSIS — I129 Hypertensive chronic kidney disease with stage 1 through stage 4 chronic kidney disease, or unspecified chronic kidney disease: Secondary | ICD-10-CM

## 2024-05-09 NOTE — Progress Notes (Signed)
 Location:  [facility] Room Number: 159 Place of Service:  SNF (31)   CODE STATUS: full   Allergies  Allergen Reactions   Empagliflozin  Other (See Comments)    Urinary tract infectious disease **Jardiance **   Poison Ivy Extract [Poison Ivy Extract] Rash   Poison Oak Extract Rash    Chief Complaint  Patient presents with   Acute Visit    Diabetes     HPI:  He is presently taking lantus  20 units nightly  with humulin R  30 units with meals. His cbg readings are improving from 300-400 to 200-310. He denies any excessive hunger or thirst. He denies any missed doses of insulin .   Past Medical History:  Diagnosis Date   Arthritis    Cancer (HCC)    bladder cancer currently 2016   CHF (congestive heart failure) (HCC)    Complication of anesthesia    hard for him to wake up from Anesthesia from left nephrectomy   COPD (chronic obstructive pulmonary disease) (HCC)    Coronary artery disease    Diabetes mellitus without complication (HCC)    DVT (deep venous thrombosis) (HCC) 01/08/2014, 2010   upper extremity   GERD (gastroesophageal reflux disease)    Hallux limitus 05/18/2015   from notes from [facility]    Headache    migraines   History of kidney stones    Hypercholesterolemia    Hypertension    Impaired hearing    Intervertebral disc syndrome    Kidney stones    Pulmonary fibrosis (HCC)    Renal calculi 01/08/2014   frrom noted from [facility] .in chart   Renal insufficiency 06/19/2023   Restless legs 01/08/2014   Sciatic leg pain    paralysis of sciatic nerve   Sleep apnea    CPAP/BIPAP   Tinnitus     Past Surgical History:  Procedure Laterality Date   ABDOMINAL SURGERY     BACK SURGERY     BLADDER REPAIR     EYE SURGERY     HERNIA REPAIR     LEFT HEART CATHETERIZATION WITH CORONARY ANGIOGRAM N/A 01/09/2015   Procedure: LEFT HEART CATHETERIZATION WITH CORONARY ANGIOGRAM;  Surgeon: Sharene Dauer, MD;  Location: [facility] CATH LAB;   Service: Cardiovascular;  Laterality: N/A;   NEPHROURETERECTOMY  03/18/2016   with bladder cuff excision- from noted in chart from [facility]   TOTAL HIP ARTHROPLASTY Left 02/10/2017   Procedure: LEFT TOTAL HIP ARTHROPLASTY ANTERIOR APPROACH;  Surgeon: Arnie Lao, MD;  Location: [facility] ORS;  Service: Orthopedics;  Laterality: Left;    Social History   Socioeconomic History   Marital status: Married    Spouse name: Not on file   Number of children: Not on file   Years of education: Not on file   Highest education level: Not on file  Occupational History   Not on file  Tobacco Use   Smoking status: Former    Current packs/day: 0.00    Average packs/day: 1 pack/day for 33.0 years (33.0 ttl pk-yrs)    Types: Cigarettes    Start date: 04/07/1967    Quit date: 04/06/2000    Years since quitting: 24.1   Smokeless tobacco: Never   Tobacco comments:    use to chew cigars  Substance and Sexual Activity   Alcohol use: Not Currently    Comment: rarely   Drug use: Never    Comment: patient states does not use illegal drugs   Sexual activity:  Not on file  Other Topics Concern   Not on file  Social History Narrative   Not on file   Social Drivers of Health   Financial Resource Strain: Not on file  Food Insecurity: No Food Insecurity (04/11/2024)   Hunger Vital Sign    Worried About Running Out of Food in the Last Year: Never true    Ran Out of Food in the Last Year: Never true  Transportation Needs: No Transportation Needs (04/11/2024)   PRAPARE - Administrator, Civil Service (Medical): No    Lack of Transportation (Non-Medical): No  Physical Activity: Not on file  Stress: Not on file  Social Connections: Moderately Isolated (04/11/2024)   Social Connection and Isolation Panel    Frequency of Communication with Friends and Family: Three times a week    Frequency of Social Gatherings with Friends and Family: Never    Attends Religious Services: Never    Automotive engineer or Organizations: No    Attends Banker Meetings: Never    Marital Status: Married  Catering manager Violence: Not At Risk (04/11/2024)   Humiliation, Afraid, Rape, and Kick questionnaire    Fear of Current or Ex-Partner: No    Emotionally Abused: No    Physically Abused: No    Sexually Abused: No   No family history on file.    VITAL SIGNS BP 109/61   Pulse 62   Temp 98.7 F (37.1 C)   Resp 18   Ht 5' 8 (1.727 m)   Wt 243 lb 1.6 oz (110.3 kg)   SpO2 98%   BMI 36.96 kg/m   Outpatient Encounter Medications as of 05/09/2024  Medication Sig   acetaminophen  (TYLENOL ) 325 MG tablet Take 325 mg by mouth in the morning and at bedtime.   albuterol  (PROVENTIL ) (2.5 MG/3ML) 0.083% nebulizer solution Take 3 mLs (2.5 mg total) by nebulization every 2 (two) hours as needed for wheezing or shortness of breath.   apixaban  (ELIQUIS ) 5 MG TABS tablet Take 1 tablet (5 mg total) by mouth 2 (two) times daily.   atorvastatin  (LIPITOR ) 40 MG tablet Take 40 mg by mouth at bedtime.   cholecalciferol  (VITAMIN D ) 1000 UNITS tablet Take 1,000 Units by mouth every morning.   Ensure Max Protein (ENSURE MAX PROTEIN) LIQD Take 330 mLs (11 oz total) by mouth 2 (two) times daily.   ferrous sulfate  325 (65 FE) MG tablet Take 325 mg by mouth every Monday, Wednesday, and Friday.   Fluticasone -Salmeterol (ADVAIR) 250-50 MCG/DOSE AEPB Inhale 1 puff into the lungs daily.   furosemide  (LASIX ) 20 MG tablet Take 2 tablets (40 mg total) by mouth daily.   gabapentin  (NEURONTIN ) 100 MG capsule Take 100 mg by mouth 2 (two) times daily.   Glucagon, rDNA, (GLUCAGON EMERGENCY) 1 MG KIT Inject 1 mg into the skin as needed (low bs).   insulin  glargine (LANTUS  SOLOSTAR) 100 UNIT/ML Solostar Pen Inject 20 Units into the skin at bedtime.   insulin  regular human CONCENTRATED (HUMULIN R ) 500 UNIT/ML injection Inject 30 Units into the skin 3 (three) times daily with meals.   ipratropium (ATROVENT ) 0.02 %  nebulizer solution Take 2.5 mLs (0.5 mg total) by nebulization 2 (two) times daily.   levalbuterol  (XOPENEX ) 0.63 MG/3ML nebulizer solution Take 3 mLs (0.63 mg total) by nebulization 2 (two) times daily.   loratadine (CLARITIN) 10 MG tablet Take 10 mg by mouth daily as needed for allergies.   Multiple Vitamin (MULTIVITAMIN WITH  MINERALS) TABS tablet Take 1 tablet by mouth daily.   mupirocin  ointment (BACTROBAN ) 2 % 1 Application 2 (two) times daily.   OXYGEN Inhale 2-3 L/min into the lungs See admin instructions. 2 L/min continuous. May increase to 3 L/min as needed for shortness of breath.   rOPINIRole  (REQUIP ) 1 MG tablet Take 1 mg by mouth at bedtime.   sennosides-docusate sodium  (SENOKOT-S) 8.6-50 MG tablet Take 1 tablet by mouth daily as needed for constipation.   tamsulosin  (FLOMAX ) 0.4 MG CAPS capsule Take 0.8 mg by mouth at bedtime.   tiotropium (SPIRIVA  HANDIHALER) 18 MCG inhalation capsule Place 1 capsule (18 mcg total) into inhaler and inhale daily.   tirzepatide (MOUNJARO) 12.5 MG/0.5ML Pen Inject 12.5 mg into the skin once a week.   No facility-administered encounter medications on file as of 05/09/2024.     SIGNIFICANT DIAGNOSTIC EXAMS  LABS REVIEWED;   04-11-24: wbc 5.7; hgb 10.8; hct 35.3; mcv 92.9 plt 178; glucose 141; bun 65; creat 2.36; k+ 4.8; na++ 138; ca 8.6 gfr 29 protein 6.4 albumin 2.3 04-17-24: wbc 3.8; hgb 10.6; hct 36.2; mcv 995.5 plt 150; glucose 164; bun 69; creat 2.23; k+ 4.5; na++ 137; ca 9.8; gfr 30   Review of Systems  Constitutional:  Negative for malaise/fatigue.  Respiratory:  Negative for cough and shortness of breath.   Cardiovascular:  Negative for chest pain, palpitations and leg swelling.  Gastrointestinal:  Negative for abdominal pain, constipation and heartburn.  Musculoskeletal:  Negative for back pain, joint pain and myalgias.  Skin: Negative.   Neurological:  Negative for dizziness.  Psychiatric/Behavioral:  The patient is not  nervous/anxious.    Physical Exam Constitutional:      General: He is not in acute distress.    Appearance: He is well-developed. He is obese. He is not diaphoretic.  Neck:     Thyroid: No thyromegaly.   Cardiovascular:     Rate and Rhythm: Normal rate. Rhythm irregular.     Pulses: Normal pulses.     Heart sounds: Murmur heard.  Pulmonary:     Effort: Pulmonary effort is normal. No respiratory distress.     Breath sounds: Normal breath sounds.  Abdominal:     General: Bowel sounds are normal. There is no distension.     Palpations: Abdomen is soft.     Tenderness: There is no abdominal tenderness.   Musculoskeletal:        General: Normal range of motion.     Cervical back: Neck supple.     Right lower leg: Edema present.     Left lower leg: Edema present.  Lymphadenopathy:     Cervical: No cervical adenopathy.   Skin:    General: Skin is warm and dry.   Neurological:     Mental Status: He is alert. Mental status is at baseline.   Psychiatric:        Mood and Affect: Mood normal.      ASSESSMENT/ PLAN:  TODAY  Type 2 diabetes mellitus with stage 3 chronic kidney disease and hypertension: due to his elevated cbg readings will increase lantus  to 24 units nightly and will monitor his status.    Britt Candle NP [facility]   call [phone]

## 2024-05-14 ENCOUNTER — Encounter: Payer: Self-pay | Admitting: Adult Health

## 2024-05-14 ENCOUNTER — Other Ambulatory Visit: Payer: Self-pay | Admitting: Adult Health

## 2024-05-14 ENCOUNTER — Non-Acute Institutional Stay (SKILLED_NURSING_FACILITY): Admitting: Adult Health

## 2024-05-14 DIAGNOSIS — E1169 Type 2 diabetes mellitus with other specified complication: Secondary | ICD-10-CM

## 2024-05-14 DIAGNOSIS — I700 Atherosclerosis of aorta: Secondary | ICD-10-CM

## 2024-05-14 DIAGNOSIS — J438 Other emphysema: Secondary | ICD-10-CM | POA: Diagnosis not present

## 2024-05-14 DIAGNOSIS — J9612 Chronic respiratory failure with hypercapnia: Secondary | ICD-10-CM

## 2024-05-14 DIAGNOSIS — J8410 Pulmonary fibrosis, unspecified: Secondary | ICD-10-CM

## 2024-05-14 DIAGNOSIS — E785 Hyperlipidemia, unspecified: Secondary | ICD-10-CM

## 2024-05-14 MED ORDER — MOUNJARO 12.5 MG/0.5ML ~~LOC~~ SOAJ: 12.5000 mg | SUBCUTANEOUS | 0 refills | Status: AC

## 2024-05-14 MED ORDER — IPRATROPIUM BROMIDE 0.02 % IN SOLN: 0.5000 mg | Freq: Two times a day (BID) | RESPIRATORY_TRACT | 0 refills | Status: DC

## 2024-05-14 MED ORDER — TIOTROPIUM BROMIDE MONOHYDRATE 18 MCG IN CAPS: 18.0000 ug | ORAL_CAPSULE | Freq: Every day | RESPIRATORY_TRACT | 0 refills | Status: AC

## 2024-05-14 MED ORDER — ATORVASTATIN CALCIUM 40 MG PO TABS: 40.0000 mg | ORAL_TABLET | Freq: Every day | ORAL | 0 refills | Status: DC

## 2024-05-14 MED ORDER — ROPINIROLE HCL 1 MG PO TABS: 1.0000 mg | ORAL_TABLET | Freq: Every day | ORAL | 0 refills | Status: AC

## 2024-05-14 MED ORDER — TAMSULOSIN HCL 0.4 MG PO CAPS: 0.8000 mg | ORAL_CAPSULE | Freq: Every day | ORAL | 0 refills | Status: AC

## 2024-05-14 MED ORDER — LANTUS SOLOSTAR 100 UNIT/ML ~~LOC~~ SOPN: 24.0000 [IU] | PEN_INJECTOR | Freq: Every day | SUBCUTANEOUS | 0 refills | Status: DC

## 2024-05-14 MED ORDER — APIXABAN 5 MG PO TABS: 5.0000 mg | ORAL_TABLET | Freq: Two times a day (BID) | ORAL | 0 refills | Status: AC

## 2024-05-14 MED ORDER — ALBUTEROL SULFATE (2.5 MG/3ML) 0.083% IN NEBU: 2.5000 mg | INHALATION_SOLUTION | RESPIRATORY_TRACT | 12 refills | Status: AC | PRN

## 2024-05-14 MED ORDER — FERROUS SULFATE 325 (65 FE) MG PO TABS: 325.0000 mg | ORAL_TABLET | ORAL | 0 refills | Status: AC

## 2024-05-14 MED ORDER — GABAPENTIN 100 MG PO CAPS: 100.0000 mg | ORAL_CAPSULE | Freq: Two times a day (BID) | ORAL | 0 refills | Status: AC

## 2024-05-14 MED ORDER — FUROSEMIDE 20 MG PO TABS: 40.0000 mg | ORAL_TABLET | Freq: Every day | ORAL | 0 refills | Status: AC

## 2024-05-14 MED ORDER — INSULIN REGULAR HUMAN (CONC) 500 UNIT/ML ~~LOC~~ SOLN: 30.0000 [IU] | Freq: Three times a day (TID) | SUBCUTANEOUS | 0 refills | Status: DC

## 2024-05-14 MED ORDER — UMECLIDINIUM BROMIDE 62.5 MCG/ACT IN AEPB
1.0000 | INHALATION_SPRAY | Freq: Every day | RESPIRATORY_TRACT | 0 refills | Status: DC
Start: 2024-05-14 — End: 2024-07-05

## 2024-05-14 MED ORDER — LEVALBUTEROL HCL 0.63 MG/3ML IN NEBU: 0.6300 mg | INHALATION_SOLUTION | Freq: Two times a day (BID) | RESPIRATORY_TRACT | 0 refills | Status: DC

## 2024-05-14 NOTE — Progress Notes (Signed)
 Location:  [facility] Room Number: 159 Place of Service:  SNF (31)   CODE STATUS: full   Allergies  Allergen Reactions   Empagliflozin  Other (See Comments)    Urinary tract infectious disease **Jardiance **   Poison Ivy Extract [Poison Ivy Extract] Rash   Poison Oak Extract Rash    Chief Complaint  Patient presents with   Discharge Note    HPI:  He is being discharged to home with home health for pt/ot. He will need a nebulizer machine with supplies. He will need his prescriptions written and will need to follow up with his medical provider. He had been hospitalized for acute metabolic encephalopathy secondary to hypercapnia which improved with bipap. He was treated for acute on chronic hypoxic and hypercapnic respiratory failure; and pneumonia. He was admitted to this facility for short term rehab. He did require adjustments to his diabetic regimen to include starting on lantus . Therapy: ambulate: 50-75 feet with rolling walker and min assist; upper body supervision; lower body mod assist; BCAT 14/50. Brp: mod assist.    Past Medical History:  Diagnosis Date   Arthritis    Cancer (HCC)    bladder cancer currently 2016   CHF (congestive heart failure) (HCC)    Complication of anesthesia    hard for him to wake up from Anesthesia from left nephrectomy   COPD (chronic obstructive pulmonary disease) (HCC)    Coronary artery disease    Diabetes mellitus without complication (HCC)    DVT (deep venous thrombosis) (HCC) 01/08/2014, 2010   upper extremity   GERD (gastroesophageal reflux disease)    Hallux limitus 05/18/2015   from notes from [facility]    Headache    migraines   History of kidney stones    Hypercholesterolemia    Hypertension    Impaired hearing    Intervertebral disc syndrome    Kidney stones    Pulmonary fibrosis (HCC)    Renal calculi 01/08/2014   frrom noted from [facility] .in chart   Renal insufficiency 06/19/2023   Restless  legs 01/08/2014   Sciatic leg pain    paralysis of sciatic nerve   Sleep apnea    CPAP/BIPAP   Tinnitus     Past Surgical History:  Procedure Laterality Date   ABDOMINAL SURGERY     BACK SURGERY     BLADDER REPAIR     EYE SURGERY     HERNIA REPAIR     LEFT HEART CATHETERIZATION WITH CORONARY ANGIOGRAM N/A 01/09/2015   Procedure: LEFT HEART CATHETERIZATION WITH CORONARY ANGIOGRAM;  Surgeon: Rober LOISE Chroman, MD;  Location: [facility] CATH LAB;  Service: Cardiovascular;  Laterality: N/A;   NEPHROURETERECTOMY  03/18/2016   with bladder cuff excision- from noted in chart from [facility]   TOTAL HIP ARTHROPLASTY Left 02/10/2017   Procedure: LEFT TOTAL HIP ARTHROPLASTY ANTERIOR APPROACH;  Surgeon: Lonni CINDERELLA Poli, MD;  Location: [facility] ORS;  Service: Orthopedics;  Laterality: Left;    Social History   Socioeconomic History   Marital status: Married    Spouse name: Not on file   Number of children: Not on file   Years of education: Not on file   Highest education level: Not on file  Occupational History   Not on file  Tobacco Use   Smoking status: Former    Current packs/day: 0.00    Average packs/day: 1 pack/day for 33.0 years (33.0 ttl pk-yrs)    Types: Cigarettes    Start date:  04/07/1967    Quit date: 04/06/2000    Years since quitting: 24.1   Smokeless tobacco: Never   Tobacco comments:    use to chew cigars  Substance and Sexual Activity   Alcohol use: Not Currently    Comment: rarely   Drug use: Never    Comment: patient states does not use illegal drugs   Sexual activity: Not on file  Other Topics Concern   Not on file  Social History Narrative   Not on file   Social Drivers of Health   Financial Resource Strain: Not on file  Food Insecurity: No Food Insecurity (04/11/2024)   Hunger Vital Sign    Worried About Running Out of Food in the Last Year: Never true    Ran Out of Food in the Last Year: Never true  Transportation Needs: No Transportation Needs (04/11/2024)    PRAPARE - Administrator, Civil Service (Medical): No    Lack of Transportation (Non-Medical): No  Physical Activity: Not on file  Stress: Not on file  Social Connections: Moderately Isolated (04/11/2024)   Social Connection and Isolation Panel    Frequency of Communication with Friends and Family: Three times a week    Frequency of Social Gatherings with Friends and Family: Never    Attends Religious Services: Never    Database administrator or Organizations: No    Attends Banker Meetings: Never    Marital Status: Married  Catering manager Violence: Not At Risk (04/11/2024)   Humiliation, Afraid, Rape, and Kick questionnaire    Fear of Current or Ex-Partner: No    Emotionally Abused: No    Physically Abused: No    Sexually Abused: No   No family history on file.    VITAL SIGNS BP 139/63   Pulse 76   Temp (!) 97.4 F (36.3 C)   Resp 18   Ht 5' 8 (1.727 m)   Wt 243 lb 3.2 oz (110.3 kg)   SpO2 97%   BMI 36.98 kg/m   Outpatient Encounter Medications as of 05/14/2024  Medication Sig Note   acetaminophen  (TYLENOL ) 325 MG tablet Take 325 mg by mouth in the morning and at bedtime.    albuterol  (PROVENTIL ) (2.5 MG/3ML) 0.083% nebulizer solution Take 3 mLs (2.5 mg total) by nebulization every 2 (two) hours as needed for wheezing or shortness of breath.    apixaban  (ELIQUIS ) 5 MG TABS tablet Take 1 tablet (5 mg total) by mouth 2 (two) times daily.    atorvastatin  (LIPITOR ) 40 MG tablet Take 1 tablet (40 mg total) by mouth at bedtime.    cholecalciferol  (VITAMIN D ) 1000 UNITS tablet Take 1,000 Units by mouth every morning.    Ensure Max Protein (ENSURE MAX PROTEIN) LIQD Take 330 mLs (11 oz total) by mouth 2 (two) times daily.    [START ON 05/15/2024] ferrous sulfate  325 (65 FE) MG tablet Take 1 tablet (325 mg total) by mouth every Monday, Wednesday, and Friday.    furosemide  (LASIX ) 20 MG tablet Take 2 tablets (40 mg total) by mouth daily.    gabapentin   (NEURONTIN ) 100 MG capsule Take 1 capsule (100 mg total) by mouth 2 (two) times daily.    Glucagon, rDNA, (GLUCAGON EMERGENCY) 1 MG KIT Inject 1 mg into the skin as needed (low bs).    insulin  glargine (LANTUS  SOLOSTAR) 100 UNIT/ML Solostar Pen Inject 24 Units into the skin at bedtime.    insulin  regular human CONCENTRATED (HUMULIN R ) 500  UNIT/ML injection Inject 0.06 mLs (30 Units total) into the skin 3 (three) times daily with meals.    ipratropium (ATROVENT ) 0.02 % nebulizer solution Take 2.5 mLs (0.5 mg total) by nebulization 2 (two) times daily.    levalbuterol  (XOPENEX ) 0.63 MG/3ML nebulizer solution Take 3 mLs (0.63 mg total) by nebulization 2 (two) times daily.    loratadine (CLARITIN) 10 MG tablet Take 10 mg by mouth daily as needed for allergies.    Multiple Vitamin (MULTIVITAMIN WITH MINERALS) TABS tablet Take 1 tablet by mouth daily.    OXYGEN Inhale 2-3 L/min into the lungs See admin instructions. 2 L/min continuous. May increase to 3 L/min as needed for shortness of breath.    rOPINIRole  (REQUIP ) 1 MG tablet Take 1 tablet by mouth at bedtime. 06/20/2023: Patient takes 1mg  along with 0.5mg  for a total of 1.5mg  daily at bedtime.   rOPINIRole  (REQUIP ) 1 MG tablet Take 1 tablet (1 mg total) by mouth at bedtime.    sennosides-docusate sodium  (SENOKOT-S) 8.6-50 MG tablet Take 1 tablet by mouth daily as needed for constipation.    tamsulosin  (FLOMAX ) 0.4 MG CAPS capsule Take 2 capsules (0.8 mg total) by mouth at bedtime.    tiotropium (SPIRIVA  HANDIHALER) 18 MCG inhalation capsule Place 1 capsule (18 mcg total) into inhaler and inhale daily.    tirzepatide (MOUNJARO) 12.5 MG/0.5ML Pen Inject 12.5 mg into the skin once a week.    umeclidinium bromide  (INCRUSE ELLIPTA ) 62.5 MCG/ACT AEPB Inhale 1 puff into the lungs daily.    No facility-administered encounter medications on file as of 05/14/2024.     SIGNIFICANT DIAGNOSTIC EXAMS  LABS REVIEWED;   04-11-24: wbc 5.7; hgb 10.8; hct 35.3; mcv  92.9 plt 178; glucose 141; bun 65; creat 2.36; k+ 4.8; na++ 138; ca 8.6 gfr 29 protein 6.4 albumin 2.3 04-17-24: wbc 3.8; hgb 10.6; hct 36.2; mcv 995.5 plt 150; glucose 164; bun 69; creat 2.23; k+ 4.5; na++ 137; ca 9.8; gfr 30   Review of Systems  Constitutional:  Negative for malaise/fatigue.  Respiratory:  Negative for cough and shortness of breath.   Cardiovascular:  Negative for chest pain, palpitations and leg swelling.  Gastrointestinal:  Negative for abdominal pain, constipation and heartburn.  Musculoskeletal:  Negative for back pain, joint pain and myalgias.  Skin: Negative.   Neurological:  Negative for dizziness.  Psychiatric/Behavioral:  The patient is not nervous/anxious.    Physical Exam Constitutional:      General: He is not in acute distress.    Appearance: He is well-developed. He is obese. He is not diaphoretic.  Neck:     Thyroid: No thyromegaly.   Cardiovascular:     Rate and Rhythm: Normal rate. Rhythm irregular.     Pulses: Normal pulses.     Heart sounds: Murmur heard.  Pulmonary:     Effort: Pulmonary effort is normal. No respiratory distress.     Breath sounds: Normal breath sounds.  Abdominal:     General: Bowel sounds are normal. There is no distension.     Palpations: Abdomen is soft.     Tenderness: There is no abdominal tenderness.   Musculoskeletal:        General: Normal range of motion.     Cervical back: Neck supple.     Right lower leg: Edema present.     Left lower leg: Edema present.  Lymphadenopathy:     Cervical: No cervical adenopathy.   Skin:    General: Skin is warm and dry.  Neurological:     Mental Status: He is alert. Mental status is at baseline.   Psychiatric:        Mood and Affect: Mood normal.      ASSESSMENT/ PLAN:   Patient is being discharged with the following home health services:  pt/ot to evaluate and treat as indicated for gait balance strength adl training   Patient is being discharged with the  following durable medical equipment:  nebulizer with supplies   Patient has been advised to f/u with their PCP in 1-2 weeks to for a transitions of care visit.  Social services at their facility was responsible for arranging this appointment.  Pt was provided with adequate prescriptions of noncontrolled medications to reach the scheduled appointment .  For controlled substances, a limited supply was provided as appropriate for the individual patient.  If the pt normally receives these medications from a pain clinic or has a contract with another physician, these medications should be received from that clinic or physician only).    A 30 day supply of his prescription medications have been sent to [facility]  Time spent with patient: 40 minutes: medications; home health; dme.   Barnie Seip NP [facility]  call [phone]

## 2024-07-03 ENCOUNTER — Other Ambulatory Visit: Payer: Self-pay | Admitting: Urology

## 2024-07-05 NOTE — Progress Notes (Signed)
 Sent message, via epic in basket, requesting orders in epic from Careers adviser.

## 2024-07-08 NOTE — Patient Instructions (Addendum)
 SURGICAL WAITING ROOM VISITATION  Patients having surgery or a procedure may have no more than 2 support people in the waiting area - these visitors may rotate.    Children under the age of 73 must have an adult with them who is not the patient.  Visitors with respiratory illnesses are discouraged from visiting and should remain at home.  If the patient needs to stay at the hospital during part of their recovery, the visitor guidelines for inpatient rooms apply. Pre-op nurse will coordinate an appropriate time for 1 support person to accompany patient in pre-op.  This support person may not rotate.    Please refer to the [facility] website for the visitor guidelines for Inpatients (after your surgery is over and you are in a regular room).       Your procedure is scheduled on: 07/18/24   Report to [facility] Main Entrance    Report to admitting at 7:30 AM   Call this number if you have problems the morning of surgery [phone]   Do not eat food or drink liquids:After Midnight.but may have sips of water with meds.       FOLLOW BOWEL PREP AND ANY ADDITIONAL PRE OP INSTRUCTIONS YOU RECEIVED FROM YOUR SURGEON'S OFFICE!!!     Oral Hygiene is also important to reduce your risk of infection.                                    Remember - BRUSH YOUR TEETH THE MORNING OF SURGERY WITH YOUR REGULAR TOOTHPASTE  DENTURES WILL BE REMOVED PRIOR TO SURGERY PLEASE DO NOT APPLY Poly grip OR ADHESIVES!!!   Stop all vitamins and herbal supplements 7 days before surgery.   Take these medicines the morning of surgery with A SIP OF WATER: inhaler, gabapentin , loratadine, tylenol  if needed.  DO NOT TAKE ANY ORAL DIABETIC MEDICATIONS DAY OF YOUR SURGERY  Bring CPAP mask and tubing day of surgery.                              You may not have any metal on your body including hair pins, jewelry, and body piercing             Do not wear lotions, powders, cologne, or deodorant               Men may shave face and neck.   Do not bring valuables to the hospital. [facility] IS NOT             RESPONSIBLE   FOR VALUABLES.   Contacts, glasses, dentures or bridgework may not be worn into surgery.  DO NOT BRING YOUR HOME MEDICATIONS TO THE HOSPITAL. PHARMACY WILL DISPENSE MEDICATIONS LISTED ON YOUR MEDICATION LIST TO YOU DURING YOUR ADMISSION IN THE HOSPITAL!    Patients discharged on the day of surgery will not be allowed to drive home.  Someone NEEDS to stay with you for the first 24 hours after anesthesia.   Special Instructions: Bring a copy of your healthcare power of attorney and living will documents the day of surgery if you haven't scanned them before.              Please read over the following fact sheets you were given: IF YOU HAVE QUESTIONS ABOUT YOUR PRE-OP INSTRUCTIONS PLEASE CALL [phone] [place]   If you  received a COVID test during your pre-op visit  it is requested that you wear a mask when out in public, stay away from anyone that may not be feeling well and notify your surgeon if you develop symptoms. If you test positive for Covid or have been in contact with anyone that has tested positive in the last 10 days please notify you surgeon.    [facility] - Preparing for Surgery Before surgery, you can play an important role.  Because skin is not sterile, your skin needs to be as free of germs as possible.  You can reduce the number of germs on your skin by washing with CHG (chlorahexidine gluconate) soap before surgery.  CHG is an antiseptic cleaner which kills germs and bonds with the skin to continue killing germs even after washing. Please DO NOT use if you have an allergy to CHG or antibacterial soaps.  If your skin becomes reddened/irritated stop using the CHG and inform your nurse when you arrive at Short Stay. Do not shave (including legs and underarms) for at least 48 hours prior to the first CHG shower.  You may shave your face/neck.  Please follow  these instructions carefully:  1.  Shower with CHG Soap the night before surgery and the  morning of surgery.  2.  If you choose to wash your hair, wash your hair first as usual with your normal  shampoo.  3.  After you shampoo, rinse your hair and body thoroughly to remove the shampoo.                             4.  Use CHG as you would any other liquid soap.  You can apply chg directly to the skin and wash.  Gently with a scrungie or clean washcloth.  5.  Apply the CHG Soap to your body ONLY FROM THE NECK DOWN.   Do   not use on face/ open                           Wound or open sores. Avoid contact with eyes, ears mouth and   genitals (private parts).                       Wash face,  Genitals (private parts) with your normal soap.             6.  Wash thoroughly, paying special attention to the area where your    surgery  will be performed.  7.  Thoroughly rinse your body with warm water from the neck down.  8.  DO NOT shower/wash with your normal soap after using and rinsing off the CHG Soap.                9.  Pat yourself dry with a clean towel.            10.  Wear clean pajamas.            11.  Place clean sheets on your bed the night of your first shower and do not  sleep with pets. Day of Surgery : Do not apply any lotions/deodorants the morning of surgery.  Please wear clean clothes to the hospital/surgery center.  FAILURE TO FOLLOW THESE INSTRUCTIONS MAY RESULT IN THE CANCELLATION OF YOUR SURGERY  _How to Manage Your Diabetes Before and After Surgery  Why  is it important to control my blood sugar before and after surgery? Improving blood sugar levels before and after surgery helps healing and can limit problems. A way of improving blood sugar control is eating a healthy diet by:  Eating less sugar and carbohydrates  Increasing activity/exercise  Talking with your doctor about reaching your blood sugar goals High blood sugars (greater than 180 mg/dL) can raise your risk of  infections and slow your recovery, so you will need to focus on controlling your diabetes during the weeks before surgery. Make sure that the doctor who takes care of your diabetes knows about your planned surgery including the date and location.  How do I manage my blood sugar before surgery? Check your blood sugar at least 4 times a day, starting 2 days before surgery, to make sure that the level is not too high or low. Check your blood sugar the morning of your surgery when you wake up and every 2 hours until you get to the Short Stay unit. If your blood sugar is less than 70 mg/dL, you will need to treat for low blood sugar: Do not take insulin . Treat a low blood sugar (less than 70 mg/dL) with  cup of clear juice (cranberry or apple), 4 glucose tablets, OR glucose gel. Recheck blood sugar in 15 minutes after treatment (to make sure it is greater than 70 mg/dL). If your blood sugar is not greater than 70 mg/dL on recheck, call [phone] for further instructions. Report your blood sugar to the short stay nurse when you get to Short Stay.  If you are admitted to the hospital after surgery: Your blood sugar will be checked by the staff and you will probably be given insulin  after surgery (instead of oral diabetes medicines) to make sure you have good blood sugar levels. The goal for blood sugar control after surgery is 80-180 mg/dL.   WHAT DO I DO ABOUT MY DIABETES MEDICATION?  Do not take oral diabetes medicines (pills) the morning of surgery.   THE MORNING OF SURGERY, do not take morning dose of insulin  unless blood sugar is greater than 220 then take half of normal dose.  DO NOT TAKE THE FOLLOWING 7 DAYS PRIOR TO SURGERY: Ozempic, Wegovy, Rybelsus (Semaglutide), Byetta (exenatide), Bydureon (exenatide ER), Victoza, Saxenda (liraglutide), or Trulicity (dulaglutide) Mounjaro  (Tirzepatide ) Adlyxin (Lixisenatide), Polyethylene Glycol Loxenatide.  Patient Signature:  Date:   Nurse  Signature:  Date:

## 2024-07-08 NOTE — Progress Notes (Addendum)
 COVID Vaccine received:  []  No [x]  Yes Date of any COVID positive Test in last 90 days: no PCP - [facility] [place]- Dr. Medford Backer Cardiologist - Dr. Redell Shallow  Chest x-ray - 04/16/24 Epic EKG -  04/12/24 Epic Stress Test -  ECHO - 04/12/24 Epic Cardiac Cath - 01/09/15 Epic  Records requested from [facility] medical records.  Bowel Prep - [x]  No  []   Yes ______  Pacemaker / ICD device [x]  No []  Yes   Spinal Cord Stimulator:[x]  No []  Yes       History of Sleep Apnea? []  No [x]  Yes   CPAP used?- []  No [x]  Yes    Does the patient monitor blood sugar?          []  No [x]  Yes  []  N/A  Patient has: []  NO Hx DM   []  Pre-DM                 []  DM1  [x]   DM2 Does patient have a [employer] or Dexacom? []  No [x]  Yes   Fasting Blood Sugar Ranges- 200 Checks Blood Sugar ___3-5__ times a day  GLP1 agonist / usual dose - Mounjaro   last dose 07/09/24 GLP1 instructions:  SGLT-2 inhibitors / usual dose -  SGLT-2 instructions:   Blood Thinner / Instructions:Eliquis  last dose to be 07/12/24 Aspirin  Instructions:  Comments:   Activity level: Patient is  unable to climb a flight of stairs without difficulty; [x]  No CP  []  No SOB, but would havel eg weakness ___   Patient can perform ADLs without assistance.   Anesthesia review: DM,COPD,CHF,OSA,A-Fib,CAD,DVT, On O2  Patient denies shortness of breath, fever, cough and chest pain at PAT appointment.  Patient verbalized understanding and agreement to the Pre-Surgical Instructions that were given to them at this PAT appointment. Patient was also educated of the need to review these PAT instructions again prior to his/her surgery.I reviewed the appropriate phone numbers to call if they have any and questions or concerns.

## 2024-07-09 ENCOUNTER — Encounter (HOSPITAL_COMMUNITY): Payer: Self-pay

## 2024-07-09 ENCOUNTER — Encounter (HOSPITAL_COMMUNITY)
Admission: RE | Admit: 2024-07-09 | Discharge: 2024-07-09 | Disposition: A | Discharge status: Home / Self Care | Source: Ambulatory Visit | Attending: Urology | Admitting: Urology

## 2024-07-09 ENCOUNTER — Other Ambulatory Visit: Payer: Self-pay

## 2024-07-09 VITALS — BP 140/67 | HR 84 | Temp 98.2°F | Resp 18 | Ht 68.0 in | Wt 238.0 lb

## 2024-07-09 DIAGNOSIS — E1169 Type 2 diabetes mellitus with other specified complication: Secondary | ICD-10-CM | POA: Diagnosis not present

## 2024-07-09 DIAGNOSIS — Z01818 Encounter for other preprocedural examination: Secondary | ICD-10-CM

## 2024-07-09 DIAGNOSIS — Z01812 Encounter for preprocedural laboratory examination: Secondary | ICD-10-CM | POA: Insufficient documentation

## 2024-07-09 DIAGNOSIS — E785 Hyperlipidemia, unspecified: Secondary | ICD-10-CM | POA: Insufficient documentation

## 2024-07-09 HISTORY — DX: Personal history of other diseases of the digestive system: Z87.19

## 2024-07-09 HISTORY — DX: Cardiac arrhythmia, unspecified: I49.9

## 2024-07-09 LAB — BASIC METABOLIC PANEL WITH GFR
Anion gap: 10 (ref 5–15)
BUN: 38 mg/dL — ABNORMAL HIGH (ref 8–23)
CO2: 25 mmol/L (ref 22–32)
Calcium: 9.7 mg/dL (ref 8.9–10.3)
Chloride: 103 mmol/L (ref 98–111)
Creatinine, Ser: 2.08 mg/dL — ABNORMAL HIGH (ref 0.61–1.24)
GFR, Estimated: 33 mL/min — ABNORMAL LOW (ref >60)
Glucose, Bld: 248 mg/dL — ABNORMAL HIGH (ref 70–99)
Potassium: 4.8 mmol/L (ref 3.5–5.1)
Sodium: 138 mmol/L (ref 135–145)

## 2024-07-09 LAB — CBC
HCT: 37.5 % — ABNORMAL LOW (ref 39.0–52.0)
Hemoglobin: 11.3 g/dL — ABNORMAL LOW (ref 13.0–17.0)
MCH: 27.8 pg (ref 26.0–34.0)
MCHC: 30.1 g/dL (ref 30.0–36.0)
MCV: 92.4 fL (ref 80.0–100.0)
Platelets: 144 K/uL — ABNORMAL LOW (ref 150–400)
RBC: 4.06 MIL/uL — ABNORMAL LOW (ref 4.22–5.81)
RDW: 15.3 % (ref 11.5–15.5)
WBC: 4.2 K/uL (ref 4.0–10.5)
nRBC: 0 % (ref 0.0–0.2)

## 2024-07-09 LAB — URINALYSIS, ROUTINE W REFLEX MICROSCOPIC
Bacteria, UA: NONE SEEN
Bilirubin Urine: NEGATIVE
Glucose, UA: 50 mg/dL — AB
Ketones, ur: NEGATIVE mg/dL
Nitrite: NEGATIVE
Protein, ur: 30 mg/dL — AB
RBC / HPF: >50 RBC/hpf (ref 0–5)
Specific Gravity, Urine: 1.013 (ref 1.005–1.030)
WBC, UA: >50 WBC/hpf (ref 0–5)
pH: 5 (ref 5.0–8.0)

## 2024-07-09 LAB — GLUCOSE, CAPILLARY: Glucose-Capillary: 240 mg/dL — ABNORMAL HIGH (ref 70–99)

## 2024-07-10 ENCOUNTER — Encounter (HOSPITAL_COMMUNITY): Payer: Self-pay

## 2024-07-10 LAB — URINE CULTURE: Culture: NO GROWTH

## 2024-07-10 LAB — HEMOGLOBIN A1C
Hgb A1c MFr Bld: 8.3 % — ABNORMAL HIGH (ref 4.8–5.6)
Mean Plasma Glucose: 192 mg/dL

## 2024-07-10 NOTE — Progress Notes (Signed)
 Request sent to Dr. CHRISTELLA. Pace to view pt's pre op A1c and BMP from 07/09/24

## 2024-07-10 NOTE — Progress Notes (Signed)
 Case: [id number] Date/Time: 07/18/24 0930   Procedure: TURBT, WITH CHEMOTHERAPEUTIC AGENT INSTILLATION INTO BLADDER - TRANSURETHRAL RESECTION OF BLADDER TUMOR AND INTRAVESICAL GEMCITABINE    Anesthesia type: General   Diagnosis: Malignant neoplasm of overlapping sites of bladder (HCC) [C67.8]   Pre-op diagnosis: BLADDER CANCER   Location: [facility] ROOM 10 / [facility]   Surgeons: Elisabeth Valli BIRCH, MD       DISCUSSION: Ricky Johnston is a 73 yo chronically ill male with extensive PMH of former smoking, HTN, nonobstructive CAD, CHF EF 50%, PAF (on Eliquis ), PVCs, hx of DVT, OSA (uses bipap), ILD, COPD with chronic respiratory failure on 2-4L O2, migraines, RLS, GERD, hiatal hernia, IDDM (A1c 8.3), arthritis, CKD stage 3 (baseline SCr 2-2.3), urothelial carcinoma s/p left nephroureterectomy (2017), recurrent bladder cancer s/p TURBT (12/2023 at [facility])  Prior anesthesia complications include prolonged emergence and difficult airway. Glidescope used for 07/19/2019 surgery. Mac 4 blade with grade I view used for 01/04/24 surgery.  Admitted 5/7-5/13 for sepsis due to UTI with acute on chronic respiratory failure 2/2 COPD/CHF exacerbation. Treated with antibiotics, breathing tx, biPAP at night. Stabilized and discharged to SNF.  Readmitted from 5/14-5/21 due to AMS with acute on chronic hypoxic and hypercapnic respiratory failure. Etiology thought to be multifactorial secondary to noncompliance with BiPAP at SNF, CHF/COPD exacerbation. Also treated for PNA. Treated with BiPAP with significant improvement in mentation. Stable on 4L during the daytime. Discharged back to SNF.  Patient discharged from SNF on 6/17 to home. Patient follows with his PCP at the [facility]. They also prescribe his Eliquis . He was seen for hospital f/u on 05/17/24 (note scanned in media on 8/19). Noted to be at his baseline. Medical clearance obtained from the [facility] on 07/05/24 that patient is cleared for surgery and can hold Eliquis  for 3 days (scanned in  media on 07/10/24).  Patient follows with Cardiology intermittently at [facility]. He had an abnormal stress test in 2016 which was followed by [facility] in 12/2014 which showed EF 45-50%, mild, nonobstructive disease. Last Echo showed EF 50% in 03/2024.  Last seen in clinic on 10/16/2023. Noted to be stable at that visit and euvolemic. Advised continue GDMT and f/u in 6 months.  At PAT visit patient reports being unable to climb a flight of stairs without SOB which is baseline. Patient denies shortness of breath, fever, cough and chest pain at PAT appointment.  LD Eliquis : 8/15 LD Mounjaro : 8/12   VS: BP (!) 140/67   Pulse 84   Temp 36.8 C (Oral)   Resp 18   Ht 5' 8 (1.727 m)   Wt 108 kg   SpO2 95%   BMI 36.19 kg/m   PROVIDERS: [facility] - Lonni Backer MD Cardiologist - Dr. Redell Shallow   LABS: Labs reviewed: Acceptable for surgery. Anemia stable. CKD stable. DM uncontrolled. (all labs ordered are listed, but only abnormal results are displayed)  Labs Reviewed  HEMOGLOBIN A1C - Abnormal; Notable for the following components:      Result Value   Hgb A1c MFr Bld 8.3 (*)    All other components within normal limits  BASIC METABOLIC PANEL WITH GFR - Abnormal; Notable for the following components:   Glucose, Bld 248 (*)    BUN 38 (*)    Creatinine, Ser 2.08 (*)    GFR, Estimated 33 (*)    All other components within normal limits  CBC - Abnormal; Notable for the following components:   RBC 4.06 (*)  Hemoglobin 11.3 (*)    HCT 37.5 (*)    Platelets 144 (*)    All other components within normal limits  URINALYSIS, ROUTINE W REFLEX MICROSCOPIC - Abnormal; Notable for the following components:   APPearance HAZY (*)    Glucose, UA 50 (*)    Hgb urine dipstick MODERATE (*)    Protein, ur 30 (*)    Leukocytes,Ua MODERATE (*)    All other components within normal limits  GLUCOSE, CAPILLARY - Abnormal; Notable for the following components:   Glucose-Capillary 240 (*)     All other components within normal limits  URINE CULTURE  SURGICAL PCR SCREEN     IMAGES: Chest x-ray 04/16/2024:  FINDINGS: Cardiomegaly. Mediastinal contours within normal limits. No confluent airspace opacities, effusions or edema. No acute bony abnormality.   IMPRESSION: Cardiomegaly.  No active disease.    EKG 04/12/2024:  Normal sinus rhythm with Premature atrial complexes, rate 67 Rightward axis Low voltage QRS Borderline ECG When compared with ECG of 03-Apr-2024 12:11, HEART RATE has decreased  CV:  Echo 04/12/2024:  IMPRESSIONS    1. Left ventricular ejection fraction, by estimation, is 50%. The left ventricle has mildly decreased function. Left ventricular endocardial border not optimally defined to evaluate regional wall motion. Left ventricular diastolic parameters are indeterminate.  2. Right ventricular systolic function is normal. The right ventricular size is normal. Tricuspid regurgitation signal is inadequate for assessing PA pressure.  3. The mitral valve was not well visualized. No evidence of mitral valve regurgitation. No evidence of mitral stenosis.  4. The aortic valve is tricuspid. There is mild calcification of the aortic valve. Aortic valve regurgitation is not visualized. No aortic stenosis is present.  5. The inferior vena cava is dilated in size with >50% respiratory variability, suggesting right atrial pressure of 8 mmHg.  6. Technically difficult study with very poor images.  [facility] 01/10/2015:  FINDINGS:  There was mild LVH, EF of 45-50%.  Left main was long which was patent.  LAD has 10-15% mid stenosis and then was diffusely diseased distally.  Diagonal 1 was small which was patent.  Diagonal 2 was very, very small.  Ramus was patent.  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  RCA is large which has 10-15% proximal and 25- 30% mid  stenosis.  PDA and PLV branches were patent.  PLAN:  To maximize ACE inhibitors and beta-blockers, lifestyle changes. Past Medical History:  Diagnosis Date   Arthritis    Cancer (HCC)    bladder cancer currently 2016   CHF (congestive heart failure) (HCC)    Complication of anesthesia    hard for him to wake up from Anesthesia from left nephrectomy   COPD (chronic obstructive pulmonary disease) (HCC)    Coronary artery disease    Diabetes mellitus without complication (HCC)    DVT (deep venous thrombosis) (HCC) 01/08/2014, 2010   upper extremity   Dysrhythmia    GERD (gastroesophageal reflux disease)    Hallux limitus 05/18/2015   from notes from [facility]    Headache    migraines   History of hiatal hernia    History of kidney stones    Hypercholesterolemia    Hypertension    Impaired hearing    Intervertebral disc syndrome    Kidney stones    Pulmonary fibrosis (HCC)    Renal calculi 01/08/2014   [facility]   Renal insufficiency 06/19/2023   Restless legs 01/08/2014   Sciatic leg pain    paralysis of sciatic nerve   Sleep apnea    CPAP/BIPAP   Tinnitus     Past Surgical History:  Procedure Laterality Date   ABDOMINAL SURGERY     BACK SURGERY     BLADDER REPAIR     EYE SURGERY     HERNIA REPAIR     LEFT HEART CATHETERIZATION WITH CORONARY ANGIOGRAM N/A 01/09/2015   Procedure: LEFT HEART CATHETERIZATION WITH CORONARY ANGIOGRAM;  Surgeon: Rober LOISE Chroman, MD;  Location: [facility] CATH [facility];  Service: Cardiovascular;  Laterality: N/A;   NEPHROURETERECTOMY  03/18/2016   with bladder cuff excision- from noted in chart from [facility]   TOTAL HIP ARTHROPLASTY Left 02/10/2017   Procedure: LEFT TOTAL HIP ARTHROPLASTY ANTERIOR APPROACH;  Surgeon: Lonni CINDERELLA Poli, MD;  Location: [facility];  Service: Orthopedics;  Laterality: Left;    MEDICATIONS:  acetaminophen  (TYLENOL ) 325 MG tablet   albuterol  (PROVENTIL ) (2.5 MG/3ML) 0.083% nebulizer solution    albuterol  (VENTOLIN  HFA) 108 (90 Base) MCG/ACT inhaler   apixaban  (ELIQUIS ) 5 MG TABS tablet   atorvastatin  (LIPITOR ) 80 MG tablet   cholecalciferol  (VITAMIN D ) 1000 UNITS tablet   ferrous sulfate  325 (65 FE) MG tablet   fluticasone -salmeterol (WIXELA INHUB) 250-50 MCG/ACT AEPB   furosemide  (LASIX ) 20 MG tablet   gabapentin  (NEURONTIN ) 100 MG capsule   Glucagon, rDNA, (GLUCAGON EMERGENCY) 1 MG KIT   insulin  regular human CONCENTRATED (HUMULIN R  U-500 KWIKPEN) 500 UNIT/ML KwikPen   loratadine (CLARITIN) 10 MG tablet   losartan  (COZAAR ) 25 MG tablet   OXYGEN   rOPINIRole  (REQUIP ) 1 MG tablet   tamsulosin  (FLOMAX ) 0.4 MG CAPS capsule   tiotropium (SPIRIVA  HANDIHALER) 18 MCG inhalation capsule   tirzepatide  (MOUNJARO ) 12.5 MG/0.5ML Pen   No current facility-administered medications for this encounter.   Burnard CHRISTELLA Odis DEVONNA [facility] Short Stay/Anesthesiology [facility] Phone [phone] 07/11/2024 10:24 AM

## 2024-07-17 NOTE — Anesthesia Preprocedure Evaluation (Signed)
 Anesthesia Evaluation  Patient identified by MRN, date of birth, ID band Patient awake    Reviewed: Allergy & Precautions, H&P , NPO status , Patient's Chart, lab work & pertinent test results  History of Anesthesia Complications Negative for: history of anesthetic complications  Airway Mallampati: II  TM Distance: >3 FB Neck ROM: Full    Dental no notable dental hx. (+) Partial Lower   Pulmonary former smoker   Pulmonary exam normal breath sounds clear to auscultation       Cardiovascular hypertension, + Peripheral Vascular Disease  Normal cardiovascular exam Rhythm:Regular Rate:Normal     Neuro/Psych neg Seizures Cervical myelopathy    negative psych ROS   GI/Hepatic negative GI ROS, Neg liver ROS,,,  Endo/Other  diabetes, Type 2    Renal/GU negative Renal ROS   BPH    Musculoskeletal negative musculoskeletal ROS (+)    Abdominal   Peds negative pediatric ROS (+)  Hematology negative hematology ROS (+)   Anesthesia Other Findings   Reproductive/Obstetrics negative OB ROS                              Anesthesia Physical Anesthesia Plan  ASA: 3  Anesthesia Plan: General   Post-op Pain Management: Tylenol  PO (pre-op)*   Induction: Intravenous  PONV Risk Score and Plan: 2 and Ondansetron , Dexamethasone  and Treatment may vary due to age or medical condition  Airway Management Planned: Oral ETT  Additional Equipment: None  Intra-op Plan:   Post-operative Plan: Extubation in OR  Informed Consent: I have reviewed the patients History and Physical, chart, labs and discussed the procedure including the risks, benefits and alternatives for the proposed anesthesia with the patient or authorized representative who has indicated his/her understanding and acceptance.     Dental advisory given  Plan Discussed with: CRNA  Anesthesia Plan Comments: (See PAT note from 8/12  :  Ricky Johnston is a 73 yo male with PMH of HTN, DM, s/p ACDF C3-7 (2021), s/p lumbar fusion L2-4 (2024). Pt is legally blind.   Pt with hx of heart murmur. No murmur heard on exam by provider on 03/07/23. Prior echo in 2024 done at [facility] medical was normal. )         Anesthesia Quick Evaluation

## 2024-07-17 NOTE — H&P (Signed)
 CC/HPI: cc: bladder cancer   01/26/24: 73 year old man with a history of bladder cancer referred to [facility] for BCG. Bladder cancer managed by Dr. Tommi at [facility]. 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 He completed 5/6 induction BCG treatment. He missed the last 1 due to hospitalization for UTI followed by needing to go to rehab. He is on supplemental oxygen as well as anticoagulation. He was unable to leave a urine sample today however does not feel he has UTI.     ALLERGIES: Poison [employer] Extract    MEDICATIONS: Tamsulosin  HCl 0.4 MG Capsule  ZyrTEC Allergy 10 MG Capsule  Advair Diskus 250-50 MCG/ACT Aerosol Powder Breath Activated  [employer] Spray 0.65 % Solution  Aspirin  81 MG Tablet Delayed Release  Atorvastatin  Calcium  80 MG Tablet  [employer] Acetaminophen  325 MG Capsule  Dexcom G7 Sensor  Eliquis  5 MG Tablet  Ferro-Time  Flonase  Allergy Relief 50 MCG/ACT Suspension  Gabapentin  300 MG Capsule  GlucaGen HypoKit 1 MG Solution Reconstituted  Humulin R  U-500 Kwikpen  Hydrocortisone  0.5 % Cream  Ketoconazole  2 % Cream  Losartan  Potassium 25 MG Tablet  MiraLax  17 GM Packet  Mounjaro  10 MG/0.5ML Solution Auto-injector  Oxygen  rOPINIRole  HCl 1 MG Tablet  Spiriva  HandiHaler 18 MCG Capsule  Ventolin  HFA 108 (90 Base) MCG/ACT Aerosol Solution  Vitamin D3  Voltaren Arthritis Pain 1 % Gel  Wixela Inhub 250-50 MCG/ACT Aerosol Powder Breath Activated     GU PSH: Bladder Instill AntiCA Agent - 04/01/2024, 03/25/2024, 03/18/2024, 03/08/2024, 03/01/2024       PSH Notes: Malignant neoplasm of urinary bladder     NON-GU PSH: Anesth, Bladder Surgery Ankle Arthroscopy/surgery Back Surgery (Unspecified) Foot surgery (unspecified) Hernia Repair Hip Replacement Remove Kidney Visit Complexity (formerly GPC1X) - 02/08/2024, 01/30/2024, 01/26/2024     GU PMH: Bladder Cancer overlapping sites - 04/01/2024, - 03/25/2024, - 03/18/2024, - 03/08/2024, - 03/01/2024, - 02/08/2024, - 01/26/2024 Gross hematuria - 02/08/2024, - 01/30/2024 CIS of the bladder - 01/26/2024 History of bladder cancer      PMH Notes: Impaired functional mobility, balance, gait, and endurance.  Kidney stones   NON-GU PMH: Arrhythmia Arthritis Atrial Fibrillation COPD Diabetes Type 2 DVT, History GERD Heart disease, unspecified Hypercholesterolemia Hypertension Other heart failure Sleep Apnea    FAMILY HISTORY: Diabetes - Mother heart - Mother Parkinson's Disease - Sister stroke - Mother   SOCIAL HISTORY: Marital Status: Married Preferred Language: English; Ethnicity: Not Hispanic Or Latino; Race: White Current Smoking Status: Patient does not smoke anymore.   Tobacco Use Assessment Completed: Used Tobacco in last 30 days? Does not drink anymore.  Drinks 4+ caffeinated drinks per day.    REVIEW OF SYSTEMS:    GU Review Male:   Patient denies frequent urination, hard to postpone urination, burning/ pain with urination, get up at night to urinate, leakage of urine, stream starts and stops, trouble starting your stream, have to strain to urinate , erection problems, and penile pain.  Gastrointestinal (Upper):   Patient denies nausea, vomiting, and indigestion/ heartburn.  Gastrointestinal (Lower):   Patient denies diarrhea and constipation.  Constitutional:  Patient denies fever, night sweats, weight loss, and fatigue.  Skin:   Patient denies skin rash/ lesion and itching.  Eyes:   Patient denies blurred vision and double vision.  Ears/ Nose/ Throat:   Patient denies sore throat and sinus problems.  Hematologic/Lymphatic:    Patient denies swollen glands and easy bruising.  Cardiovascular:   Patient denies leg swelling and chest pains.  Respiratory:   Patient denies shortness of breath and cough.  Endocrine:   Patient denies excessive thirst.  Musculoskeletal:   Patient denies back pain and joint pain.  Neurological:   Patient denies headaches and dizziness.  Psychologic:   Patient denies depression and anxiety.   VITAL SIGNS: None   MULTI-SYSTEM PHYSICAL EXAMINATION:    Constitutional: Well-nourished. No physical deformities. Normally developed. Good grooming.  Neck: Neck symmetrical, not swollen. Normal tracheal position.  Respiratory: Supplemental O2  Skin: No paleness, no jaundice, no cyanosis. No lesion, no ulcer, no rash.  Neurologic / Psychiatric: Oriented to time, oriented to place, oriented to person. No depression, no anxiety, no agitation.  Eyes: Normal conjunctivae. Normal eyelids.  Ears, Nose, Mouth, and Throat: Left ear no scars, no lesions, no masses. Right ear no scars, no lesions, no masses. Nose no scars, no lesions, no masses. Normal hearing. Normal lips.  Musculoskeletal: Normal gait and station of head and neck.     Complexity of Data:  Records Review:   Previous Patient Records, POC Tool  Urine Test Review:   Urinalysis   PROCEDURES:         Flexible Cystoscopy - 52000  Risks, benefits, and some of the potential complications of the procedure were discussed at length with the patient including infection, bleeding, voiding discomfort, urinary retention, fever, chills, sepsis, and others. All questions were answered. Informed consent was obtained. Sterile technique and intraurethral analgesia were used.  Meatus:  Normal size. Normal location. Normal condition.  Urethra:  No strictures.  External Sphincter:  Normal.  Verumontanum:  Normal.  Prostate:  Obstructing lateral lobes  Bladder Neck:  Non-obstructing.  Ureteral Orifices:  Normal location. Normal size. Normal shape. Effluxed  clear urine.  Bladder:  Debris in bladder making visualization difficult. Well-healed prior TUR scars. Irregular mucosa medial to left UO - bleeds easily concerning for recurrence      The lower urinary tract was carefully examined. The procedure was well-tolerated and without complications. Antibiotic instructions were given. Instructions were given to call the office immediately for bloody urine, difficulty urinating, urinary retention, painful or frequent urination, fever, chills, nausea, vomiting or other illness. The patient stated that he understood these instructions and would comply with them.   ASSESSMENT:      ICD-10 Details  1 GU:   Bladder Cancer overlapping sites - C67.8 Chronic, Worsening  2   CIS of the bladder - D09.0 Chronic, Worsening   PLAN:           Document Letter(s):  Created for Patient: Clinical Summary         Notes:   Bladder cancer:  - Cystoscopy today shows concern for recurrence  - Recommend repeat TURBT/fulguration and intravesical gemcitabine   - Risks and benefits discussed including but not limited to pain, bleeding, infection, damage to surrounding structures, need for additional treatment, need for indwelling Foley catheter   Schedule next available surgery. Will need to obtain clearance. He has had all previous care with [facility].

## 2024-07-18 ENCOUNTER — Ambulatory Visit (HOSPITAL_BASED_OUTPATIENT_CLINIC_OR_DEPARTMENT_OTHER): Admitting: Anesthesiology

## 2024-07-18 ENCOUNTER — Ambulatory Visit (HOSPITAL_COMMUNITY): Payer: Self-pay | Admitting: Medical

## 2024-07-18 ENCOUNTER — Ambulatory Visit (HOSPITAL_COMMUNITY)
Admission: RE | Admit: 2024-07-18 | Discharge: 2024-07-18 | Disposition: A | Discharge status: Home / Self Care | Attending: Urology | Admitting: Urology

## 2024-07-18 ENCOUNTER — Encounter (HOSPITAL_COMMUNITY): Payer: Self-pay | Admitting: Urology

## 2024-07-18 ENCOUNTER — Encounter (HOSPITAL_COMMUNITY)
Admission: RE | Disposition: A | Discharge status: Home / Self Care | Payer: Self-pay | Source: Home / Self Care | Attending: Urology

## 2024-07-18 DIAGNOSIS — Z87891 Personal history of nicotine dependence: Secondary | ICD-10-CM | POA: Diagnosis not present

## 2024-07-18 DIAGNOSIS — N189 Chronic kidney disease, unspecified: Secondary | ICD-10-CM | POA: Insufficient documentation

## 2024-07-18 DIAGNOSIS — E1122 Type 2 diabetes mellitus with diabetic chronic kidney disease: Secondary | ICD-10-CM | POA: Insufficient documentation

## 2024-07-18 DIAGNOSIS — I509 Heart failure, unspecified: Secondary | ICD-10-CM | POA: Diagnosis not present

## 2024-07-18 DIAGNOSIS — Z7984 Long term (current) use of oral hypoglycemic drugs: Secondary | ICD-10-CM | POA: Diagnosis not present

## 2024-07-18 DIAGNOSIS — J449 Chronic obstructive pulmonary disease, unspecified: Secondary | ICD-10-CM | POA: Insufficient documentation

## 2024-07-18 DIAGNOSIS — I130 Hypertensive heart and chronic kidney disease with heart failure and stage 1 through stage 4 chronic kidney disease, or unspecified chronic kidney disease: Secondary | ICD-10-CM | POA: Diagnosis not present

## 2024-07-18 DIAGNOSIS — C679 Malignant neoplasm of bladder, unspecified: Secondary | ICD-10-CM | POA: Insufficient documentation

## 2024-07-18 DIAGNOSIS — Z794 Long term (current) use of insulin: Secondary | ICD-10-CM | POA: Diagnosis not present

## 2024-07-18 DIAGNOSIS — Z86718 Personal history of other venous thrombosis and embolism: Secondary | ICD-10-CM | POA: Insufficient documentation

## 2024-07-18 DIAGNOSIS — K219 Gastro-esophageal reflux disease without esophagitis: Secondary | ICD-10-CM | POA: Insufficient documentation

## 2024-07-18 DIAGNOSIS — I110 Hypertensive heart disease with heart failure: Secondary | ICD-10-CM

## 2024-07-18 DIAGNOSIS — C678 Malignant neoplasm of overlapping sites of bladder: Secondary | ICD-10-CM | POA: Diagnosis present

## 2024-07-18 DIAGNOSIS — I2510 Atherosclerotic heart disease of native coronary artery without angina pectoris: Secondary | ICD-10-CM | POA: Diagnosis not present

## 2024-07-18 DIAGNOSIS — I5022 Chronic systolic (congestive) heart failure: Secondary | ICD-10-CM

## 2024-07-18 DIAGNOSIS — Z9981 Dependence on supplemental oxygen: Secondary | ICD-10-CM | POA: Diagnosis not present

## 2024-07-18 DIAGNOSIS — G4730 Sleep apnea, unspecified: Secondary | ICD-10-CM | POA: Diagnosis not present

## 2024-07-18 LAB — GLUCOSE, CAPILLARY: Glucose-Capillary: 197 mg/dL — ABNORMAL HIGH (ref 70–99)

## 2024-07-18 SURGERY — TURBT, WITH CHEMOTHERAPEUTIC AGENT INSTILLATION INTO BLADDER
Anesthesia: General | Site: Bladder

## 2024-07-18 MED ORDER — 0.9 % SODIUM CHLORIDE (POUR BTL) OPTIME
TOPICAL | Status: DC | PRN
  Administered 2024-07-18: 1000 mL

## 2024-07-18 MED ORDER — FENTANYL CITRATE PF 50 MCG/ML IJ SOSY
25.0000 ug | PREFILLED_SYRINGE | INTRAMUSCULAR | Status: DC | PRN
  Administered 2024-07-18: 25 ug via INTRAVENOUS

## 2024-07-18 MED ORDER — PROPOFOL 1000 MG/100ML IV EMUL
INTRAVENOUS | Status: AC
  Filled 2024-07-18: qty 100

## 2024-07-18 MED ORDER — OXYCODONE HCL 5 MG/5ML PO SOLN: 5.0000 mg | Freq: Once | ORAL | Status: DC | PRN

## 2024-07-18 MED ORDER — CEFAZOLIN SODIUM-DEXTROSE 2-4 GM/100ML-% IV SOLN
2.0000 g | INTRAVENOUS | Status: AC
  Administered 2024-07-18: 2 g via INTRAVENOUS
  Filled 2024-07-18: qty 100

## 2024-07-18 MED ORDER — LIDOCAINE HCL (PF) 2 % IJ SOLN
INTRAMUSCULAR | Status: DC | PRN
  Administered 2024-07-18: 100 mg via INTRADERMAL

## 2024-07-18 MED ORDER — ORAL CARE MOUTH RINSE: 15.0000 mL | Freq: Once | OROMUCOSAL | Status: AC

## 2024-07-18 MED ORDER — GEMCITABINE CHEMO FOR BLADDER INSTILLATION 2000 MG
2000.0000 mg | Freq: Once | INTRAVENOUS | Status: AC
  Administered 2024-07-18: 2000 mg via INTRAVESICAL
  Filled 2024-07-18: qty 2000

## 2024-07-18 MED ORDER — SODIUM CHLORIDE 0.9 % IR SOLN
Status: DC | PRN
  Administered 2024-07-18: 6000 mL via INTRAVESICAL

## 2024-07-18 MED ORDER — FENTANYL CITRATE (PF) 100 MCG/2ML IJ SOLN
INTRAMUSCULAR | Status: AC
Start: 2024-07-18 — End: 2024-07-18
  Filled 2024-07-18: qty 2

## 2024-07-18 MED ORDER — LACTATED RINGERS IV SOLN: INTRAVENOUS | Status: DC

## 2024-07-18 MED ORDER — ROCURONIUM BROMIDE 10 MG/ML (PF) SYRINGE
PREFILLED_SYRINGE | INTRAVENOUS | Status: AC
Start: 2024-07-18 — End: 2024-07-18
  Filled 2024-07-18: qty 10

## 2024-07-18 MED ORDER — PROPOFOL 10 MG/ML IV BOLUS
INTRAVENOUS | Status: AC
  Filled 2024-07-18: qty 20

## 2024-07-18 MED ORDER — PROPOFOL 10 MG/ML IV BOLUS
INTRAVENOUS | Status: DC | PRN
  Administered 2024-07-18: 120 mg via INTRAVENOUS

## 2024-07-18 MED ORDER — SUGAMMADEX SODIUM 200 MG/2ML IV SOLN
INTRAVENOUS | Status: AC
  Filled 2024-07-18: qty 2

## 2024-07-18 MED ORDER — INSULIN ASPART 100 UNIT/ML IJ SOLN
0.0000 [IU] | INTRAMUSCULAR | Status: DC | PRN
  Administered 2024-07-18: 2 [IU] via SUBCUTANEOUS
  Filled 2024-07-18: qty 1

## 2024-07-18 MED ORDER — ONDANSETRON HCL 4 MG/2ML IJ SOLN
INTRAMUSCULAR | Status: AC
  Filled 2024-07-18: qty 2

## 2024-07-18 MED ORDER — ACETAMINOPHEN 10 MG/ML IV SOLN: 1000.0000 mg | Freq: Once | INTRAVENOUS | Status: DC | PRN

## 2024-07-18 MED ORDER — SUGAMMADEX SODIUM 200 MG/2ML IV SOLN
INTRAVENOUS | Status: DC | PRN
  Administered 2024-07-18: 400 mg via INTRAVENOUS

## 2024-07-18 MED ORDER — SUGAMMADEX SODIUM 200 MG/2ML IV SOLN
INTRAVENOUS | Status: AC
  Filled 2024-07-18: qty 4

## 2024-07-18 MED ORDER — CHLORHEXIDINE GLUCONATE 0.12 % MT SOLN
15.0000 mL | Freq: Once | OROMUCOSAL | Status: AC
  Administered 2024-07-18: 15 mL via OROMUCOSAL

## 2024-07-18 MED ORDER — DEXAMETHASONE SODIUM PHOSPHATE 10 MG/ML IJ SOLN
INTRAMUSCULAR | Status: AC
  Filled 2024-07-18: qty 1

## 2024-07-18 MED ORDER — ONDANSETRON HCL 4 MG/2ML IJ SOLN
INTRAMUSCULAR | Status: DC | PRN
Start: 2024-07-18 — End: 2024-07-18
  Administered 2024-07-18: 4 mg via INTRAVENOUS

## 2024-07-18 MED ORDER — OXYCODONE HCL 5 MG PO TABS: 5.0000 mg | ORAL_TABLET | Freq: Once | ORAL | Status: DC | PRN

## 2024-07-18 MED ORDER — TRAMADOL HCL 50 MG PO TABS: 50.0000 mg | ORAL_TABLET | Freq: Four times a day (QID) | ORAL | 0 refills | Status: AC | PRN

## 2024-07-18 MED ORDER — ROCURONIUM BROMIDE 10 MG/ML (PF) SYRINGE
PREFILLED_SYRINGE | INTRAVENOUS | Status: DC | PRN
  Administered 2024-07-18: 10 mg via INTRAVENOUS
  Administered 2024-07-18: 50 mg via INTRAVENOUS

## 2024-07-18 MED ORDER — FENTANYL CITRATE (PF) 100 MCG/2ML IJ SOLN
INTRAMUSCULAR | Status: AC
  Filled 2024-07-18: qty 2

## 2024-07-18 MED ORDER — LIDOCAINE HCL (PF) 2 % IJ SOLN
INTRAMUSCULAR | Status: AC
  Filled 2024-07-18: qty 5

## 2024-07-18 MED ORDER — FENTANYL CITRATE (PF) 100 MCG/2ML IJ SOLN
INTRAMUSCULAR | Status: DC | PRN
  Administered 2024-07-18: 25 ug via INTRAVENOUS
  Administered 2024-07-18: 100 ug via INTRAVENOUS

## 2024-07-18 MED ORDER — SODIUM CHLORIDE 0.9 % IV SOLN: INTRAVENOUS | Status: DC

## 2024-07-18 MED ORDER — DEXAMETHASONE SODIUM PHOSPHATE 10 MG/ML IJ SOLN
INTRAMUSCULAR | Status: DC | PRN
  Administered 2024-07-18: 5 mg via INTRAVENOUS

## 2024-07-18 MED ORDER — DROPERIDOL 2.5 MG/ML IJ SOLN: 0.6250 mg | Freq: Once | INTRAMUSCULAR | Status: DC | PRN

## 2024-07-18 MED ORDER — FENTANYL CITRATE PF 50 MCG/ML IJ SOSY
PREFILLED_SYRINGE | INTRAMUSCULAR | Status: AC
Start: 2024-07-18 — End: 2024-07-18
  Filled 2024-07-18: qty 1

## 2024-07-18 SURGICAL SUPPLY — 17 items
BAG URINE DRAIN 2000ML AR STRL (UROLOGICAL SUPPLIES) IMPLANT
BAG URO CATCHER STRL LF (MISCELLANEOUS) ×1 IMPLANT
CATH URETL OPEN END 6FR 70 (CATHETERS) IMPLANT
CLOTH BEACON ORANGE TIMEOUT ST (SAFETY) ×1 IMPLANT
DRAPE FOOT SWITCH (DRAPES) ×1 IMPLANT
ELECT REM PT RETURN 15FT ADLT (MISCELLANEOUS) IMPLANT
GLOVE BIO SURGEON STRL SZ 6.5 (GLOVE) ×1 IMPLANT
GOWN STRL REUS W/ TWL LRG LVL3 (GOWN DISPOSABLE) ×1 IMPLANT
GUIDEWIRE STR DUAL SENSOR (WIRE) IMPLANT
KIT TURNOVER KIT A (KITS) ×1 IMPLANT
LOOP CUT BIPOLAR 24F LRG (ELECTROSURGICAL) IMPLANT
MANIFOLD NEPTUNE II (INSTRUMENTS) ×1 IMPLANT
PACK CYSTO (CUSTOM PROCEDURE TRAY) ×1 IMPLANT
PAD PREP 24X48 CUFFED NSTRL (MISCELLANEOUS) ×1 IMPLANT
SYRINGE TOOMEY IRRIG 70ML (MISCELLANEOUS) IMPLANT
TUBING CONNECTING 10 (TUBING) ×1 IMPLANT
TUBING UROLOGY SET (TUBING) ×1 IMPLANT

## 2024-07-18 NOTE — Interval H&P Note (Signed)
 History and Physical Interval Note:  07/18/2024 8:48 AM  Ricky Johnston  has presented today for surgery, with the diagnosis of BLADDER CANCER.  The various methods of treatment have been discussed with the patient and family. After consideration of risks, benefits and other options for treatment, the patient has consented to  Procedure(s) with comments: TURBT, WITH CHEMOTHERAPEUTIC AGENT INSTILLATION INTO BLADDER (N/A) - TRANSURETHRAL RESECTION OF BLADDER TUMOR AND INTRAVESICAL GEMCITABINE  as a surgical intervention.  The patient's history has been reviewed, patient examined, no change in status, stable for surgery.  I have reviewed the patient's chart and labs.  Questions were answered to the patient's satisfaction.     Tempestt Silba D Morad Tal

## 2024-07-18 NOTE — Op Note (Signed)
 Operative Note  Preoperative diagnosis:  1.  Bladder cancer  Postoperative diagnosis: 1.  Bladder cancer  Procedure(s): 1.  Cystoscopy with TURBT and fulguration (2 cm) 2.  Instillation 2g intravesical gemcitabine   Surgeon: Valli Shank, MD  Assistants:  None  Anesthesia:  General  Complications:  None  EBL:  minimal  Specimens: 1. Abnormal bladder mucosa  Drains/Catheters: 1.  18Fr foley catheter   Intraoperative findings:   Normal anterior urethra Friable prostatic urethra Right ureteral orifice appears to have been previously resected and is large in caliber.  Absent left ureteral orifice.  Shaggy irregular bladder mucosa proximal to bladder neck and trigone  Indication:  Ricky Johnston is a 73 y.o. male with history of bladder cancer previously cared for by Dr. Tommi.  He underwent BCG and on surveillance cystoscopy had abnormal mucosa concerning for recurrence.  Description of procedure:  After risks and benefits of the procedure discussed with the patient, informed consent was obtained.  The patient taken to the operating placed in the supine position.  Anesthesia was induced and antibiotics were administered.  He was then repositioned in the dorsolithotomy position.  Using the visual obturator the resectoscope was advanced into the urethral meatus and into the bladder under direct visualization.  The visual obturator was removed and the working element with the bipolar electrocautery loop was assembled.  Inspection of the right ureteral orifice noted a large caliber and likely previously resected UO.  No visible left ureteral orifice consistent with absent left kidney.  There were shaggy bladder mucosa noted at the trigone.  This area was then resected the bipolar electrocautery loop.  Hemostasis was achieved with the loop.  Hemostasis was adequate with the irrigant off.  The resectoscope was then removed.  An 36 French Foley catheter was placed without difficulty  and inflated with 10 cc sterile water.  The patient emerged from anesthesia and was transferred the PACU in stable condition.  The plan will be for gemcitabine  in the PACU.   Plan: Patient will have 2 g of gemcitabine  placed in the PACU.  Patient's catheter will then be drained and he will be discharged home.

## 2024-07-18 NOTE — Transfer of Care (Signed)
 Immediate Anesthesia Transfer of Care Note  Patient: Ricky Johnston  Procedure(s) Performed: TURBT, WITH CHEMOTHERAPEUTIC AGENT INSTILLATION INTO BLADDER (Bladder)  Patient Location: PACU  Anesthesia Type:General  Level of Consciousness: oriented, drowsy, and patient cooperative  Airway & Oxygen Therapy: Patient Spontanous Breathing and Patient connected to face mask oxygen  Post-op Assessment: Report given to RN and Post -op Vital signs reviewed and stable  Post vital signs: Reviewed and stable  Last Vitals:  Vitals Value Taken Time  BP 151/76 07/18/24 10:13  Temp    Pulse 93 07/18/24 10:14  Resp 22 07/18/24 10:14  SpO2 100 % 07/18/24 10:14  Vitals shown include unfiled device data.  Last Pain:  Vitals:   07/18/24 0756  TempSrc: Oral  PainSc: 0-No pain         Complications: No notable events documented.

## 2024-07-18 NOTE — Discharge Instructions (Addendum)
 Transurethral Resection of Bladder Tumor (TURBT)   Definition:  Transurethral Resection of the Bladder Tumor is a surgical procedure used to diagnose and remove tumors within the bladder. TURBT is the most common treatment for early stage bladder cancer.  General instructions:     Your recent bladder surgery requires very little post hospital care but some definite precautions.  Despite the fact that no skin incisions were used, the area around the bladder incisions are raw and covered with scabs to promote healing and prevent bleeding. Certain precautions are needed to insure that the scabs are not disturbed over the next 2-4 weeks while the healing proceeds.  Because the raw surface inside your bladder and the irritating effects of urine you may expect frequency of urination and/or urgency (a stronger desire to urinate) and perhaps even getting up at night more often. This will usually resolve or improve slowly over the healing period. You may see some blood in your urine over the first 6 weeks. Do not be alarmed, even if the urine was clear for a while. Get off your feet and drink lots of fluids until clearing occurs. If you start to pass clots or don't improve call us .  Catheter: (If you are discharged with a catheter.)  1. Keep your catheter secured to your leg at all times with tape or the supplied strap. 2. You may experience leakage of urine around your catheter- as long as the  catheter continues to drain, this is normal.  If your catheter stops draining  go to the ER. 3. You may also have blood in your urine, even after it has been clear for  several days; you may even pass some small blood clots or other material.  This  is normal as well.  If this happens, sit down and drink plenty of water to help  make urine to flush out your bladder.  If the blood in your urine becomes worse  after doing this, contact our office or return to the ER. 4. You may use the leg bag (small bag)  during the day, but use the large bag at  night.  Diet:  You may return to your normal diet immediately. Because of the raw surface of your bladder, alcohol, spicy foods, foods high in acid and drinks with caffeine may cause irritation or frequency and should be used in moderation. To keep your urine flowing freely and avoid constipation, drink plenty of fluids during the day (8-10 glasses). Tip: Avoid cranberry juice because it is very acidic.  Activity:  Your physical activity doesn't need to be restricted. However, if you are very active, you may see some blood in the urine. We suggest that you reduce your activity under the circumstances until the bleeding has stopped.  Bowels:  It is important to keep your bowels regular during the postoperative period. Straining with bowel movements can cause bleeding. A bowel movement every other day is reasonable. Use a mild laxative if needed, such as milk of magnesia 2-3 tablespoons, or 2 Dulcolax tablets. Call if you continue to have problems. If you had been taking narcotics for pain, before, during or after your surgery, you may be constipated. Take a laxative if necessary.    Medication:  You should resume your pre-surgery medications unless told not to. In addition you may be given an antibiotic to prevent or treat infection. Antibiotics are not always necessary. All medication should be taken as prescribed until the bottles are finished unless you are having  an unusual reaction to one of the drugs.   Do not restart eliquis  until blood in urine has resolved.  This will take 2-3 days.

## 2024-07-18 NOTE — Anesthesia Procedure Notes (Signed)
 Procedure Name: Intubation Date/Time: 07/18/2024 9:29 AM  Performed by: Augusta Daved SAILOR, CRNAPre-anesthesia Checklist: Patient identified, Emergency Drugs available, Suction available and Patient being monitored Patient Re-evaluated:Patient Re-evaluated prior to induction Oxygen Delivery Method: Circle System Utilized Preoxygenation: Pre-oxygenation with 100% oxygen Induction Type: IV induction Ventilation: Two handed mask ventilation required and Oral airway inserted - appropriate to patient size Laryngoscope Size: Glidescope and 3 Grade View: Grade I Tube type: Oral Tube size: 7.5 mm Number of attempts: 1 Airway Equipment and Method: Stylet and Oral airway Placement Confirmation: ETT inserted through vocal cords under direct vision, positive ETCO2 and breath sounds checked- equal and bilateral Secured at: 23 (at the lip) cm Tube secured with: Tape Dental Injury: Teeth and Oropharynx as per pre-operative assessment

## 2024-07-18 NOTE — Anesthesia Postprocedure Evaluation (Signed)
 Anesthesia Post Note  Patient: Ricky Johnston  Procedure(s) Performed: TURBT, WITH CHEMOTHERAPEUTIC AGENT INSTILLATION INTO BLADDER (Bladder)     Patient location during evaluation: PACU Anesthesia Type: General Level of consciousness: awake and alert Pain management: pain level controlled Vital Signs Assessment: post-procedure vital signs reviewed and stable Respiratory status: spontaneous breathing, nonlabored ventilation, respiratory function stable and patient connected to nasal cannula oxygen Cardiovascular status: blood pressure returned to baseline and stable Postop Assessment: no apparent nausea or vomiting Anesthetic complications: no   No notable events documented.  Last Vitals:  Vitals:   07/18/24 1149 07/18/24 1251  BP:    Pulse: 82   Resp: 19   Temp: 36.8 C 36.8 C  SpO2: 94%     Last Pain:  Vitals:   07/18/24 1251  TempSrc: Oral  PainSc:                  [name]

## 2024-07-19 LAB — SURGICAL PATHOLOGY
